# Patient Record
Sex: Female | Born: 1935 | Race: Black or African American | Hispanic: No | Smoking: Never smoker
Health system: Southern US, Community
[De-identification: ages and names within clinical notes are randomized; demographics above are authoritative.]

## PROBLEM LIST (undated history)

## (undated) DIAGNOSIS — E079 Disorder of thyroid, unspecified: Secondary | ICD-10-CM

## (undated) DIAGNOSIS — C50912 Malignant neoplasm of unspecified site of left female breast: Secondary | ICD-10-CM

## (undated) DIAGNOSIS — F32A Depression, unspecified: Secondary | ICD-10-CM

## (undated) DIAGNOSIS — Z9989 Dependence on other enabling machines and devices: Secondary | ICD-10-CM

## (undated) DIAGNOSIS — R32 Unspecified urinary incontinence: Secondary | ICD-10-CM

## (undated) DIAGNOSIS — R001 Bradycardia, unspecified: Secondary | ICD-10-CM

## (undated) DIAGNOSIS — Z974 Presence of external hearing-aid: Secondary | ICD-10-CM

## (undated) DIAGNOSIS — M1990 Unspecified osteoarthritis, unspecified site: Secondary | ICD-10-CM

## (undated) DIAGNOSIS — Z950 Presence of cardiac pacemaker: Secondary | ICD-10-CM

## (undated) DIAGNOSIS — K08109 Complete loss of teeth, unspecified cause, unspecified class: Secondary | ICD-10-CM

## (undated) DIAGNOSIS — C50911 Malignant neoplasm of unspecified site of right female breast: Secondary | ICD-10-CM

## (undated) DIAGNOSIS — F419 Anxiety disorder, unspecified: Secondary | ICD-10-CM

## (undated) DIAGNOSIS — K219 Gastro-esophageal reflux disease without esophagitis: Secondary | ICD-10-CM

## (undated) DIAGNOSIS — L930 Discoid lupus erythematosus: Secondary | ICD-10-CM

## (undated) DIAGNOSIS — M109 Gout, unspecified: Secondary | ICD-10-CM

## (undated) DIAGNOSIS — M8580 Other specified disorders of bone density and structure, unspecified site: Secondary | ICD-10-CM

## (undated) DIAGNOSIS — G4733 Obstructive sleep apnea (adult) (pediatric): Secondary | ICD-10-CM

## (undated) DIAGNOSIS — Z9289 Personal history of other medical treatment: Secondary | ICD-10-CM

## (undated) DIAGNOSIS — K589 Irritable bowel syndrome without diarrhea: Secondary | ICD-10-CM

## (undated) DIAGNOSIS — I509 Heart failure, unspecified: Secondary | ICD-10-CM

## (undated) DIAGNOSIS — F329 Major depressive disorder, single episode, unspecified: Secondary | ICD-10-CM

## (undated) DIAGNOSIS — E039 Hypothyroidism, unspecified: Secondary | ICD-10-CM

## (undated) DIAGNOSIS — I10 Essential (primary) hypertension: Secondary | ICD-10-CM

## (undated) DIAGNOSIS — Z972 Presence of dental prosthetic device (complete) (partial): Secondary | ICD-10-CM

## (undated) HISTORY — PX: HEMORRHOID BANDING: SHX5850

## (undated) HISTORY — DX: Personal history of other medical treatment: Z92.89

## (undated) HISTORY — DX: Bradycardia, unspecified: R00.1

## (undated) HISTORY — DX: Anxiety disorder, unspecified: F41.9

## (undated) HISTORY — PX: VAGINAL HYSTERECTOMY: SUR661

## (undated) HISTORY — DX: Unspecified urinary incontinence: R32

## (undated) HISTORY — DX: Discoid lupus erythematosus: L93.0

## (undated) HISTORY — DX: Depression, unspecified: F32.A

## (undated) HISTORY — DX: Major depressive disorder, single episode, unspecified: F32.9

## (undated) HISTORY — DX: Essential (primary) hypertension: I10

## (undated) HISTORY — DX: Irritable bowel syndrome, unspecified: K58.9

## (undated) HISTORY — DX: Presence of cardiac pacemaker: Z95.0

## (undated) HISTORY — PX: CATARACT EXTRACTION W/ INTRAOCULAR LENS  IMPLANT, BILATERAL: SHX1307

## (undated) HISTORY — DX: Gastro-esophageal reflux disease without esophagitis: K21.9

## (undated) HISTORY — DX: Other specified disorders of bone density and structure, unspecified site: M85.80

## (undated) HISTORY — DX: Disorder of thyroid, unspecified: E07.9

## (undated) HISTORY — DX: Obstructive sleep apnea (adult) (pediatric): G47.33

## (undated) HISTORY — DX: Unspecified osteoarthritis, unspecified site: M19.90

---

## 1993-04-26 DIAGNOSIS — C50912 Malignant neoplasm of unspecified site of left female breast: Secondary | ICD-10-CM

## 1993-04-26 HISTORY — PX: BREAST BIOPSY: SHX20

## 1993-04-26 HISTORY — PX: BREAST LUMPECTOMY: SHX2

## 1993-04-26 HISTORY — DX: Malignant neoplasm of unspecified site of left female breast: C50.912

## 1998-04-26 HISTORY — PX: DORSAL COMPARTMENT RELEASE: SHX1474

## 1998-07-17 ENCOUNTER — Other Ambulatory Visit: Admission: RE | Admit: 1998-07-17 | Discharge: 1998-07-17 | Payer: Self-pay | Admitting: Obstetrics and Gynecology

## 1998-07-27 ENCOUNTER — Emergency Department (HOSPITAL_COMMUNITY): Admission: EM | Admit: 1998-07-27 | Discharge: 1998-07-28 | Payer: Self-pay | Admitting: Emergency Medicine

## 1999-03-31 ENCOUNTER — Ambulatory Visit (HOSPITAL_BASED_OUTPATIENT_CLINIC_OR_DEPARTMENT_OTHER): Admission: RE | Admit: 1999-03-31 | Discharge: 1999-03-31 | Payer: Self-pay | Admitting: Orthopedic Surgery

## 2000-02-25 ENCOUNTER — Emergency Department (HOSPITAL_COMMUNITY): Admission: EM | Admit: 2000-02-25 | Discharge: 2000-02-26 | Payer: Self-pay | Admitting: Emergency Medicine

## 2000-02-28 ENCOUNTER — Ambulatory Visit (HOSPITAL_BASED_OUTPATIENT_CLINIC_OR_DEPARTMENT_OTHER): Admission: RE | Admit: 2000-02-28 | Discharge: 2000-02-28 | Payer: Self-pay | Admitting: Pulmonary Disease

## 2000-03-31 ENCOUNTER — Encounter: Payer: Self-pay | Admitting: Internal Medicine

## 2000-03-31 ENCOUNTER — Ambulatory Visit (HOSPITAL_COMMUNITY): Admission: RE | Admit: 2000-03-31 | Discharge: 2000-03-31 | Payer: Self-pay | Admitting: Internal Medicine

## 2000-04-13 ENCOUNTER — Encounter: Admission: RE | Admit: 2000-04-13 | Discharge: 2000-04-13 | Payer: Self-pay | Admitting: Specialist

## 2000-04-13 ENCOUNTER — Encounter: Payer: Self-pay | Admitting: Specialist

## 2000-04-26 DIAGNOSIS — C50911 Malignant neoplasm of unspecified site of right female breast: Secondary | ICD-10-CM

## 2000-04-26 HISTORY — DX: Malignant neoplasm of unspecified site of right female breast: C50.911

## 2000-07-03 ENCOUNTER — Emergency Department (HOSPITAL_COMMUNITY): Admission: EM | Admit: 2000-07-03 | Discharge: 2000-07-04 | Payer: Self-pay | Admitting: Emergency Medicine

## 2000-07-04 ENCOUNTER — Encounter: Payer: Self-pay | Admitting: Emergency Medicine

## 2000-08-02 ENCOUNTER — Emergency Department (HOSPITAL_COMMUNITY): Admission: EM | Admit: 2000-08-02 | Discharge: 2000-08-02 | Payer: Self-pay

## 2000-09-24 HISTORY — PX: MASTECTOMY: SHX3

## 2000-10-12 ENCOUNTER — Encounter: Payer: Self-pay | Admitting: Surgery

## 2000-10-14 ENCOUNTER — Encounter (INDEPENDENT_AMBULATORY_CARE_PROVIDER_SITE_OTHER): Payer: Self-pay | Admitting: *Deleted

## 2000-10-14 ENCOUNTER — Inpatient Hospital Stay (HOSPITAL_COMMUNITY): Admission: RE | Admit: 2000-10-14 | Discharge: 2000-10-17 | Payer: Self-pay | Admitting: Surgery

## 2000-10-31 ENCOUNTER — Ambulatory Visit (HOSPITAL_COMMUNITY): Admission: RE | Admit: 2000-10-31 | Discharge: 2000-10-31 | Payer: Self-pay | Admitting: Oncology

## 2000-10-31 ENCOUNTER — Encounter: Payer: Self-pay | Admitting: Oncology

## 2000-11-03 ENCOUNTER — Encounter: Payer: Self-pay | Admitting: Oncology

## 2000-11-03 ENCOUNTER — Ambulatory Visit (HOSPITAL_COMMUNITY): Admission: RE | Admit: 2000-11-03 | Discharge: 2000-11-03 | Payer: Self-pay | Admitting: Oncology

## 2001-01-17 ENCOUNTER — Encounter (INDEPENDENT_AMBULATORY_CARE_PROVIDER_SITE_OTHER): Payer: Self-pay | Admitting: *Deleted

## 2001-01-17 ENCOUNTER — Ambulatory Visit (HOSPITAL_BASED_OUTPATIENT_CLINIC_OR_DEPARTMENT_OTHER): Admission: RE | Admit: 2001-01-17 | Discharge: 2001-01-18 | Payer: Self-pay | Admitting: Surgery

## 2001-01-18 ENCOUNTER — Ambulatory Visit (HOSPITAL_COMMUNITY): Admission: RE | Admit: 2001-01-18 | Discharge: 2001-01-18 | Payer: Self-pay | Admitting: Internal Medicine

## 2001-03-09 ENCOUNTER — Encounter: Admission: RE | Admit: 2001-03-09 | Discharge: 2001-03-09 | Payer: Self-pay | Admitting: Surgery

## 2001-03-09 ENCOUNTER — Encounter: Payer: Self-pay | Admitting: Surgery

## 2001-03-16 ENCOUNTER — Encounter: Admission: RE | Admit: 2001-03-16 | Discharge: 2001-03-16 | Payer: Self-pay | Admitting: Surgery

## 2001-03-16 ENCOUNTER — Encounter: Payer: Self-pay | Admitting: Surgery

## 2001-07-19 ENCOUNTER — Other Ambulatory Visit: Admission: RE | Admit: 2001-07-19 | Discharge: 2001-07-19 | Payer: Self-pay | Admitting: Obstetrics and Gynecology

## 2001-11-04 ENCOUNTER — Emergency Department (HOSPITAL_COMMUNITY): Admission: EM | Admit: 2001-11-04 | Discharge: 2001-11-04 | Payer: Self-pay | Admitting: Emergency Medicine

## 2002-04-26 HISTORY — PX: HAMMER TOE SURGERY: SHX385

## 2002-06-08 ENCOUNTER — Ambulatory Visit (HOSPITAL_COMMUNITY): Admission: RE | Admit: 2002-06-08 | Discharge: 2002-06-08 | Payer: Self-pay | Admitting: Oncology

## 2002-06-08 ENCOUNTER — Encounter: Payer: Self-pay | Admitting: Oncology

## 2002-11-05 ENCOUNTER — Emergency Department (HOSPITAL_COMMUNITY): Admission: EM | Admit: 2002-11-05 | Discharge: 2002-11-05 | Payer: Self-pay | Admitting: Emergency Medicine

## 2003-11-25 ENCOUNTER — Ambulatory Visit (HOSPITAL_COMMUNITY): Admission: RE | Admit: 2003-11-25 | Discharge: 2003-11-25 | Payer: Self-pay | Admitting: Family Medicine

## 2004-01-15 ENCOUNTER — Other Ambulatory Visit (HOSPITAL_COMMUNITY): Admission: RE | Admit: 2004-01-15 | Discharge: 2004-04-14 | Payer: Self-pay | Admitting: Psychiatry

## 2004-01-15 ENCOUNTER — Ambulatory Visit: Payer: Self-pay | Admitting: Psychiatry

## 2004-01-29 ENCOUNTER — Encounter: Admission: RE | Admit: 2004-01-29 | Discharge: 2004-01-29 | Payer: Self-pay | Admitting: Internal Medicine

## 2004-02-03 ENCOUNTER — Ambulatory Visit (HOSPITAL_COMMUNITY): Admission: RE | Admit: 2004-02-03 | Discharge: 2004-02-03 | Payer: Self-pay | Admitting: Internal Medicine

## 2004-02-07 ENCOUNTER — Ambulatory Visit (HOSPITAL_COMMUNITY): Payer: Self-pay | Admitting: Psychiatry

## 2004-02-25 ENCOUNTER — Ambulatory Visit: Payer: Self-pay | Admitting: Psychology

## 2004-03-02 ENCOUNTER — Ambulatory Visit: Payer: Self-pay | Admitting: Internal Medicine

## 2004-03-03 ENCOUNTER — Ambulatory Visit: Payer: Self-pay | Admitting: Internal Medicine

## 2004-03-03 ENCOUNTER — Ambulatory Visit: Payer: Self-pay | Admitting: Psychology

## 2004-03-06 ENCOUNTER — Ambulatory Visit (HOSPITAL_COMMUNITY): Payer: Self-pay | Admitting: Psychiatry

## 2004-03-12 ENCOUNTER — Ambulatory Visit: Payer: Self-pay | Admitting: Psychology

## 2004-03-17 ENCOUNTER — Ambulatory Visit: Payer: Self-pay | Admitting: Psychology

## 2004-03-24 ENCOUNTER — Ambulatory Visit: Payer: Self-pay | Admitting: Psychology

## 2004-03-27 ENCOUNTER — Ambulatory Visit: Payer: Self-pay | Admitting: Internal Medicine

## 2004-03-30 ENCOUNTER — Encounter: Admission: RE | Admit: 2004-03-30 | Discharge: 2004-03-30 | Payer: Self-pay | Admitting: Psychology

## 2004-04-02 ENCOUNTER — Ambulatory Visit: Payer: Self-pay | Admitting: Psychology

## 2004-04-06 ENCOUNTER — Ambulatory Visit: Payer: Self-pay | Admitting: Internal Medicine

## 2004-04-08 ENCOUNTER — Ambulatory Visit (HOSPITAL_COMMUNITY): Payer: Self-pay | Admitting: Psychiatry

## 2004-04-09 ENCOUNTER — Ambulatory Visit: Payer: Self-pay | Admitting: Psychology

## 2004-04-21 ENCOUNTER — Ambulatory Visit: Payer: Self-pay | Admitting: Internal Medicine

## 2004-04-23 ENCOUNTER — Ambulatory Visit: Payer: Self-pay | Admitting: Psychology

## 2004-04-29 ENCOUNTER — Ambulatory Visit: Payer: Self-pay | Admitting: Oncology

## 2004-05-01 ENCOUNTER — Ambulatory Visit (HOSPITAL_COMMUNITY): Admission: RE | Admit: 2004-05-01 | Discharge: 2004-05-01 | Payer: Self-pay | Admitting: Oncology

## 2004-05-07 ENCOUNTER — Ambulatory Visit: Payer: Self-pay | Admitting: Psychology

## 2004-05-08 ENCOUNTER — Ambulatory Visit: Payer: Self-pay | Admitting: Cardiology

## 2004-05-14 ENCOUNTER — Ambulatory Visit: Payer: Self-pay | Admitting: Psychology

## 2004-05-18 ENCOUNTER — Encounter: Admission: RE | Admit: 2004-05-18 | Discharge: 2004-05-18 | Payer: Self-pay | Admitting: Gastroenterology

## 2004-05-21 ENCOUNTER — Ambulatory Visit: Payer: Self-pay | Admitting: Psychology

## 2004-05-28 ENCOUNTER — Ambulatory Visit: Payer: Self-pay | Admitting: Psychology

## 2004-05-28 ENCOUNTER — Ambulatory Visit: Payer: Self-pay | Admitting: Internal Medicine

## 2004-05-29 ENCOUNTER — Ambulatory Visit (HOSPITAL_COMMUNITY): Payer: Self-pay | Admitting: Psychiatry

## 2004-06-02 ENCOUNTER — Ambulatory Visit: Payer: Self-pay

## 2004-06-11 ENCOUNTER — Ambulatory Visit: Payer: Self-pay | Admitting: Psychology

## 2004-06-15 ENCOUNTER — Ambulatory Visit (HOSPITAL_COMMUNITY): Admission: RE | Admit: 2004-06-15 | Discharge: 2004-06-15 | Payer: Self-pay | Admitting: Internal Medicine

## 2004-06-18 ENCOUNTER — Ambulatory Visit: Payer: Self-pay | Admitting: Psychology

## 2004-06-19 ENCOUNTER — Emergency Department (HOSPITAL_COMMUNITY): Admission: EM | Admit: 2004-06-19 | Discharge: 2004-06-19 | Payer: Self-pay | Admitting: Emergency Medicine

## 2004-07-02 ENCOUNTER — Ambulatory Visit: Payer: Self-pay | Admitting: Psychology

## 2004-07-09 ENCOUNTER — Ambulatory Visit: Payer: Self-pay | Admitting: Psychology

## 2004-07-12 ENCOUNTER — Emergency Department (HOSPITAL_COMMUNITY): Admission: EM | Admit: 2004-07-12 | Discharge: 2004-07-12 | Payer: Self-pay | Admitting: Emergency Medicine

## 2004-07-16 ENCOUNTER — Ambulatory Visit: Payer: Self-pay | Admitting: Psychology

## 2004-07-23 ENCOUNTER — Ambulatory Visit: Payer: Self-pay | Admitting: Psychology

## 2004-07-30 ENCOUNTER — Ambulatory Visit: Payer: Self-pay | Admitting: Psychology

## 2004-08-05 ENCOUNTER — Ambulatory Visit (HOSPITAL_COMMUNITY): Payer: Self-pay | Admitting: Psychiatry

## 2004-08-13 ENCOUNTER — Ambulatory Visit: Payer: Self-pay | Admitting: Psychology

## 2004-08-20 ENCOUNTER — Ambulatory Visit: Payer: Self-pay | Admitting: Psychology

## 2004-08-27 ENCOUNTER — Ambulatory Visit: Payer: Self-pay | Admitting: Internal Medicine

## 2004-08-27 ENCOUNTER — Ambulatory Visit: Payer: Self-pay | Admitting: Psychology

## 2004-09-03 ENCOUNTER — Ambulatory Visit: Payer: Self-pay | Admitting: Psychology

## 2004-09-09 ENCOUNTER — Ambulatory Visit (HOSPITAL_COMMUNITY): Payer: Self-pay | Admitting: Psychiatry

## 2004-09-10 ENCOUNTER — Ambulatory Visit: Payer: Self-pay | Admitting: Psychology

## 2004-09-18 ENCOUNTER — Ambulatory Visit: Payer: Self-pay | Admitting: Psychology

## 2004-10-01 ENCOUNTER — Ambulatory Visit: Payer: Self-pay | Admitting: Psychology

## 2004-10-22 ENCOUNTER — Ambulatory Visit: Payer: Self-pay | Admitting: Psychology

## 2004-10-26 ENCOUNTER — Ambulatory Visit: Payer: Self-pay | Admitting: Oncology

## 2004-10-26 ENCOUNTER — Ambulatory Visit: Payer: Self-pay | Admitting: Internal Medicine

## 2004-10-29 ENCOUNTER — Ambulatory Visit: Payer: Self-pay | Admitting: Psychology

## 2004-11-05 ENCOUNTER — Ambulatory Visit: Payer: Self-pay | Admitting: Psychology

## 2004-11-12 ENCOUNTER — Ambulatory Visit: Payer: Self-pay | Admitting: Psychology

## 2004-11-16 ENCOUNTER — Ambulatory Visit (HOSPITAL_COMMUNITY): Payer: Self-pay | Admitting: Psychiatry

## 2004-11-19 ENCOUNTER — Ambulatory Visit: Payer: Self-pay | Admitting: Psychology

## 2004-12-03 ENCOUNTER — Ambulatory Visit: Payer: Self-pay | Admitting: Psychology

## 2004-12-03 ENCOUNTER — Ambulatory Visit: Payer: Self-pay | Admitting: Internal Medicine

## 2004-12-10 ENCOUNTER — Ambulatory Visit: Payer: Self-pay | Admitting: Psychology

## 2005-01-07 ENCOUNTER — Ambulatory Visit: Payer: Self-pay | Admitting: Psychology

## 2005-01-14 ENCOUNTER — Ambulatory Visit: Payer: Self-pay | Admitting: Psychology

## 2005-01-21 ENCOUNTER — Ambulatory Visit: Payer: Self-pay | Admitting: Psychology

## 2005-02-04 ENCOUNTER — Ambulatory Visit: Payer: Self-pay | Admitting: Psychology

## 2005-02-11 ENCOUNTER — Ambulatory Visit: Payer: Self-pay | Admitting: Psychology

## 2005-02-17 ENCOUNTER — Ambulatory Visit (HOSPITAL_COMMUNITY): Payer: Self-pay | Admitting: Psychiatry

## 2005-02-18 ENCOUNTER — Ambulatory Visit: Payer: Self-pay | Admitting: Psychology

## 2005-02-25 ENCOUNTER — Ambulatory Visit: Payer: Self-pay | Admitting: Internal Medicine

## 2005-02-25 ENCOUNTER — Ambulatory Visit: Payer: Self-pay | Admitting: Psychology

## 2005-03-04 ENCOUNTER — Ambulatory Visit: Payer: Self-pay | Admitting: Psychology

## 2005-03-11 ENCOUNTER — Ambulatory Visit: Payer: Self-pay | Admitting: Psychology

## 2005-03-25 ENCOUNTER — Ambulatory Visit: Payer: Self-pay | Admitting: Psychology

## 2005-04-01 ENCOUNTER — Ambulatory Visit: Payer: Self-pay | Admitting: Psychology

## 2005-04-14 ENCOUNTER — Ambulatory Visit (HOSPITAL_COMMUNITY): Payer: Self-pay | Admitting: Psychiatry

## 2005-04-22 ENCOUNTER — Ambulatory Visit: Payer: Self-pay | Admitting: Psychology

## 2005-04-29 ENCOUNTER — Ambulatory Visit: Payer: Self-pay | Admitting: Psychology

## 2005-04-29 ENCOUNTER — Ambulatory Visit: Payer: Self-pay | Admitting: Oncology

## 2005-05-03 ENCOUNTER — Ambulatory Visit (HOSPITAL_COMMUNITY): Admission: RE | Admit: 2005-05-03 | Discharge: 2005-05-03 | Payer: Self-pay | Admitting: Surgery

## 2005-05-06 ENCOUNTER — Ambulatory Visit: Payer: Self-pay | Admitting: Psychology

## 2005-05-14 ENCOUNTER — Ambulatory Visit: Payer: Self-pay | Admitting: Internal Medicine

## 2005-05-20 ENCOUNTER — Ambulatory Visit: Payer: Self-pay | Admitting: Psychology

## 2005-05-27 ENCOUNTER — Ambulatory Visit: Payer: Self-pay | Admitting: Psychology

## 2005-06-07 ENCOUNTER — Ambulatory Visit (HOSPITAL_COMMUNITY): Payer: Self-pay | Admitting: Psychiatry

## 2005-06-10 ENCOUNTER — Ambulatory Visit: Payer: Self-pay | Admitting: Psychology

## 2005-06-24 ENCOUNTER — Ambulatory Visit: Payer: Self-pay | Admitting: Psychology

## 2005-07-01 ENCOUNTER — Ambulatory Visit: Payer: Self-pay | Admitting: Psychology

## 2005-07-22 ENCOUNTER — Ambulatory Visit: Payer: Self-pay | Admitting: Psychology

## 2005-07-29 ENCOUNTER — Ambulatory Visit: Payer: Self-pay | Admitting: Psychology

## 2005-08-02 ENCOUNTER — Ambulatory Visit (HOSPITAL_COMMUNITY): Payer: Self-pay | Admitting: Psychiatry

## 2005-08-05 ENCOUNTER — Ambulatory Visit: Payer: Self-pay | Admitting: Psychology

## 2005-08-12 ENCOUNTER — Ambulatory Visit: Payer: Self-pay | Admitting: Internal Medicine

## 2005-08-12 ENCOUNTER — Ambulatory Visit: Payer: Self-pay | Admitting: Psychology

## 2005-08-19 ENCOUNTER — Ambulatory Visit: Payer: Self-pay | Admitting: Psychology

## 2005-08-26 ENCOUNTER — Ambulatory Visit: Payer: Self-pay | Admitting: Psychology

## 2005-08-31 ENCOUNTER — Ambulatory Visit: Payer: Self-pay | Admitting: Internal Medicine

## 2005-09-02 ENCOUNTER — Ambulatory Visit: Payer: Self-pay | Admitting: Psychology

## 2005-09-09 ENCOUNTER — Ambulatory Visit: Payer: Self-pay | Admitting: Psychology

## 2005-09-16 ENCOUNTER — Ambulatory Visit: Payer: Self-pay | Admitting: Psychology

## 2005-09-16 ENCOUNTER — Ambulatory Visit: Payer: Self-pay | Admitting: Internal Medicine

## 2005-09-22 ENCOUNTER — Ambulatory Visit (HOSPITAL_COMMUNITY): Payer: Self-pay | Admitting: Psychiatry

## 2005-09-24 ENCOUNTER — Ambulatory Visit: Payer: Self-pay | Admitting: Internal Medicine

## 2005-09-24 LAB — CONVERTED CEMR LAB
BUN: 20 mg/dL (ref 6–23)
CO2: 30 meq/L (ref 19–32)
Calcium: 9.1 mg/dL (ref 8.4–10.5)
Free T4: 1.3 ng/dL (ref 0.6–1.6)
GFR calc Af Amer: 70 mL/min
GFR calc non Af Amer: 58 mL/min
Potassium: 3.5 meq/L (ref 3.5–5.5)
Sed Rate: 18 mm/hr (ref 0–25)

## 2005-09-30 ENCOUNTER — Ambulatory Visit: Payer: Self-pay | Admitting: Psychology

## 2005-10-07 ENCOUNTER — Ambulatory Visit: Payer: Self-pay | Admitting: Psychology

## 2005-10-14 ENCOUNTER — Ambulatory Visit: Payer: Self-pay | Admitting: Internal Medicine

## 2005-10-14 ENCOUNTER — Ambulatory Visit: Payer: Self-pay | Admitting: Psychology

## 2005-10-21 ENCOUNTER — Ambulatory Visit: Payer: Self-pay | Admitting: Psychology

## 2005-10-21 ENCOUNTER — Ambulatory Visit: Payer: Self-pay | Admitting: Oncology

## 2005-11-01 LAB — COMPREHENSIVE METABOLIC PANEL
ALT: 17 U/L (ref 0–40)
AST: 22 U/L (ref 0–37)
Alkaline Phosphatase: 80 U/L (ref 39–117)
CO2: 29 mEq/L (ref 19–32)
Sodium: 141 mEq/L (ref 135–145)
Total Bilirubin: 0.8 mg/dL (ref 0.3–1.2)
Total Protein: 7.6 g/dL (ref 6.0–8.3)

## 2005-11-01 LAB — CBC WITH DIFFERENTIAL/PLATELET
BASO%: 0.3 % (ref 0.0–2.0)
EOS%: 1.9 % (ref 0.0–7.0)
LYMPH%: 30.7 % (ref 14.0–48.0)
MCH: 32.6 pg (ref 26.0–34.0)
MCHC: 34 g/dL (ref 32.0–36.0)
MONO#: 0.5 10*3/uL (ref 0.1–0.9)
Platelets: 274 10*3/uL (ref 145–400)
RBC: 3.98 10*6/uL (ref 3.70–5.32)
WBC: 5.3 10*3/uL (ref 3.9–10.0)

## 2005-11-01 LAB — LACTATE DEHYDROGENASE: LDH: 167 U/L (ref 94–250)

## 2005-11-04 ENCOUNTER — Ambulatory Visit: Payer: Self-pay | Admitting: Psychology

## 2005-11-11 ENCOUNTER — Ambulatory Visit: Payer: Self-pay | Admitting: Psychology

## 2005-11-18 ENCOUNTER — Ambulatory Visit: Payer: Self-pay | Admitting: Psychology

## 2005-12-02 ENCOUNTER — Ambulatory Visit: Payer: Self-pay | Admitting: Psychology

## 2005-12-16 ENCOUNTER — Ambulatory Visit: Payer: Self-pay | Admitting: Psychology

## 2005-12-20 ENCOUNTER — Ambulatory Visit (HOSPITAL_COMMUNITY): Payer: Self-pay | Admitting: Psychiatry

## 2005-12-23 ENCOUNTER — Ambulatory Visit: Payer: Self-pay | Admitting: Psychology

## 2005-12-30 ENCOUNTER — Ambulatory Visit: Payer: Self-pay | Admitting: Psychology

## 2006-01-13 ENCOUNTER — Ambulatory Visit: Payer: Self-pay | Admitting: Psychology

## 2006-01-27 ENCOUNTER — Ambulatory Visit: Payer: Self-pay | Admitting: Psychology

## 2006-02-01 ENCOUNTER — Ambulatory Visit: Payer: Self-pay | Admitting: Internal Medicine

## 2006-02-03 ENCOUNTER — Ambulatory Visit: Payer: Self-pay | Admitting: Psychology

## 2006-02-10 ENCOUNTER — Ambulatory Visit: Payer: Self-pay | Admitting: Psychology

## 2006-02-17 ENCOUNTER — Ambulatory Visit: Payer: Self-pay | Admitting: Psychology

## 2006-02-24 ENCOUNTER — Ambulatory Visit: Payer: Self-pay | Admitting: Psychology

## 2006-03-03 ENCOUNTER — Ambulatory Visit: Payer: Self-pay | Admitting: Psychology

## 2006-03-16 ENCOUNTER — Ambulatory Visit (HOSPITAL_COMMUNITY): Payer: Self-pay | Admitting: Psychiatry

## 2006-03-24 ENCOUNTER — Ambulatory Visit: Payer: Self-pay | Admitting: Psychology

## 2006-03-28 ENCOUNTER — Ambulatory Visit (HOSPITAL_COMMUNITY): Admission: RE | Admit: 2006-03-28 | Discharge: 2006-03-28 | Payer: Self-pay | Admitting: Oncology

## 2006-03-31 ENCOUNTER — Ambulatory Visit: Payer: Self-pay | Admitting: Psychology

## 2006-04-07 ENCOUNTER — Encounter: Admission: RE | Admit: 2006-04-07 | Discharge: 2006-04-07 | Payer: Self-pay | Admitting: Obstetrics and Gynecology

## 2006-04-14 ENCOUNTER — Ambulatory Visit: Payer: Self-pay | Admitting: Psychology

## 2006-04-25 ENCOUNTER — Ambulatory Visit: Payer: Self-pay | Admitting: Oncology

## 2006-04-28 ENCOUNTER — Ambulatory Visit: Payer: Self-pay | Admitting: Psychology

## 2006-04-28 LAB — CBC WITH DIFFERENTIAL/PLATELET
BASO%: 0.4 % (ref 0.0–2.0)
Basophils Absolute: 0 10*3/uL (ref 0.0–0.1)
EOS%: 1.4 % (ref 0.0–7.0)
HCT: 37.3 % (ref 34.8–46.6)
LYMPH%: 28.1 % (ref 14.0–48.0)
MCH: 31.7 pg (ref 26.0–34.0)
MCHC: 33.7 g/dL (ref 32.0–36.0)
MCV: 94.2 fL (ref 81.0–101.0)
MONO%: 8.4 % (ref 0.0–13.0)
NEUT%: 61.7 % (ref 39.6–76.8)
Platelets: 248 10*3/uL (ref 145–400)

## 2006-04-28 LAB — COMPREHENSIVE METABOLIC PANEL
AST: 16 U/L (ref 0–37)
BUN: 19 mg/dL (ref 6–23)
CO2: 31 mEq/L (ref 19–32)
Calcium: 9.4 mg/dL (ref 8.4–10.5)
Chloride: 102 mEq/L (ref 96–112)
Creatinine, Ser: 0.88 mg/dL (ref 0.40–1.20)

## 2006-04-28 LAB — LACTATE DEHYDROGENASE: LDH: 147 U/L (ref 94–250)

## 2006-05-04 ENCOUNTER — Ambulatory Visit: Payer: Self-pay | Admitting: Internal Medicine

## 2006-05-05 ENCOUNTER — Ambulatory Visit: Payer: Self-pay | Admitting: Psychology

## 2006-05-12 ENCOUNTER — Ambulatory Visit: Payer: Self-pay | Admitting: Psychology

## 2006-05-19 ENCOUNTER — Ambulatory Visit: Payer: Self-pay | Admitting: Psychology

## 2006-05-24 ENCOUNTER — Ambulatory Visit (HOSPITAL_BASED_OUTPATIENT_CLINIC_OR_DEPARTMENT_OTHER): Admission: RE | Admit: 2006-05-24 | Discharge: 2006-05-24 | Payer: Self-pay | Admitting: Orthopedic Surgery

## 2006-05-26 ENCOUNTER — Ambulatory Visit: Payer: Self-pay | Admitting: Psychology

## 2006-06-13 ENCOUNTER — Ambulatory Visit (HOSPITAL_COMMUNITY): Payer: Self-pay | Admitting: Psychiatry

## 2006-06-16 ENCOUNTER — Ambulatory Visit: Payer: Self-pay | Admitting: Psychology

## 2006-06-22 ENCOUNTER — Ambulatory Visit: Payer: Self-pay | Admitting: Internal Medicine

## 2006-06-22 LAB — CONVERTED CEMR LAB
BUN: 13 mg/dL (ref 6–23)
Hgb A1c MFr Bld: 5.7 % (ref 4.6–6.0)
Potassium: 4.1 meq/L (ref 3.5–5.1)
Sodium: 144 meq/L (ref 135–145)
TSH: 0.13 microintl units/mL — ABNORMAL LOW (ref 0.35–5.50)

## 2006-06-23 ENCOUNTER — Ambulatory Visit: Payer: Self-pay | Admitting: Psychology

## 2006-06-30 ENCOUNTER — Ambulatory Visit: Payer: Self-pay | Admitting: Psychology

## 2006-07-05 ENCOUNTER — Encounter: Admission: RE | Admit: 2006-07-05 | Discharge: 2006-10-03 | Payer: Self-pay | Admitting: Psychology

## 2006-07-07 ENCOUNTER — Ambulatory Visit: Payer: Self-pay | Admitting: Psychology

## 2006-07-14 ENCOUNTER — Ambulatory Visit: Payer: Self-pay | Admitting: Psychology

## 2006-07-21 ENCOUNTER — Ambulatory Visit: Payer: Self-pay | Admitting: Psychology

## 2006-07-25 ENCOUNTER — Ambulatory Visit (HOSPITAL_COMMUNITY): Payer: Self-pay | Admitting: Psychiatry

## 2006-07-28 ENCOUNTER — Ambulatory Visit: Payer: Self-pay | Admitting: Psychology

## 2006-08-11 ENCOUNTER — Ambulatory Visit: Payer: Self-pay | Admitting: Psychology

## 2006-08-18 ENCOUNTER — Ambulatory Visit: Payer: Self-pay | Admitting: Internal Medicine

## 2006-08-22 ENCOUNTER — Ambulatory Visit: Payer: Self-pay | Admitting: Internal Medicine

## 2006-08-22 LAB — CONVERTED CEMR LAB
AST: 21 units/L (ref 0–37)
Bilirubin Urine: NEGATIVE
Cholesterol: 195 mg/dL (ref 0–200)
Creatinine, Ser: 0.9 mg/dL (ref 0.4–1.2)
Glucose, Bld: 68 mg/dL — ABNORMAL LOW (ref 70–99)
HDL: 44.6 mg/dL (ref >39.0)
Hemoglobin, Urine: NEGATIVE
Leukocytes, UA: NEGATIVE
Potassium: 4 meq/L (ref 3.5–5.1)
TSH: 0.43 microintl units/mL (ref 0.35–5.50)
Triglycerides: 107 mg/dL (ref 0–149)
Urobilinogen, UA: 0.2 (ref 0.0–1.0)
VLDL: 21 mg/dL (ref 0–40)
pH: 7 (ref 5.0–8.0)

## 2006-08-25 ENCOUNTER — Ambulatory Visit: Payer: Self-pay | Admitting: Psychology

## 2006-09-01 ENCOUNTER — Ambulatory Visit: Payer: Self-pay | Admitting: Psychology

## 2006-09-08 ENCOUNTER — Ambulatory Visit: Payer: Self-pay | Admitting: Psychology

## 2006-09-27 ENCOUNTER — Ambulatory Visit: Payer: Self-pay | Admitting: Psychology

## 2006-09-30 ENCOUNTER — Ambulatory Visit: Payer: Self-pay | Admitting: Internal Medicine

## 2006-09-30 LAB — CONVERTED CEMR LAB
ALT: 16 units/L (ref 0–40)
Bacteria, UA: NEGATIVE
Bilirubin Urine: NEGATIVE
Direct LDL: 130.8 mg/dL
HDL: 43.2 mg/dL (ref >39.0)
Nitrite: NEGATIVE
RBC / HPF: NONE SEEN
Specific Gravity, Urine: 1.01 (ref 1.000–1.03)
TSH: 0.82 microintl units/mL (ref 0.35–5.50)
Triglycerides: 93 mg/dL (ref 0–149)
Urine Glucose: NEGATIVE mg/dL
Urobilinogen, UA: 0.2 (ref 0.0–1.0)
VLDL: 19 mg/dL (ref 0–40)
pH: 8.5 — AB (ref 5.0–8.0)

## 2006-10-03 ENCOUNTER — Ambulatory Visit: Payer: Self-pay | Admitting: Internal Medicine

## 2006-10-06 ENCOUNTER — Ambulatory Visit: Payer: Self-pay | Admitting: Psychology

## 2006-10-13 ENCOUNTER — Ambulatory Visit: Payer: Self-pay | Admitting: Psychology

## 2006-10-24 ENCOUNTER — Ambulatory Visit (HOSPITAL_COMMUNITY): Payer: Self-pay | Admitting: Psychiatry

## 2006-10-25 ENCOUNTER — Ambulatory Visit: Payer: Self-pay | Admitting: Oncology

## 2006-10-27 ENCOUNTER — Ambulatory Visit: Payer: Self-pay | Admitting: Psychology

## 2006-10-31 LAB — CBC WITH DIFFERENTIAL/PLATELET
Basophils Absolute: 0 10*3/uL (ref 0.0–0.1)
Eosinophils Absolute: 0.1 10*3/uL (ref 0.0–0.5)
HGB: 13.2 g/dL (ref 11.6–15.9)
MONO#: 0.4 10*3/uL (ref 0.1–0.9)
NEUT#: 3.5 10*3/uL (ref 1.5–6.5)
RDW: 14.3 % (ref 11.3–14.5)
WBC: 5.6 10*3/uL (ref 3.9–10.0)
lymph#: 1.7 10*3/uL (ref 0.9–3.3)

## 2006-10-31 LAB — COMPREHENSIVE METABOLIC PANEL
Albumin: 4.2 g/dL (ref 3.5–5.2)
BUN: 22 mg/dL (ref 6–23)
Calcium: 9.3 mg/dL (ref 8.4–10.5)
Chloride: 103 mEq/L (ref 96–112)
Glucose, Bld: 101 mg/dL — ABNORMAL HIGH (ref 70–99)
Potassium: 4.4 mEq/L (ref 3.5–5.3)
Total Protein: 7.3 g/dL (ref 6.0–8.3)

## 2006-11-03 ENCOUNTER — Ambulatory Visit: Payer: Self-pay | Admitting: Psychology

## 2006-11-17 ENCOUNTER — Encounter: Payer: Self-pay | Admitting: Internal Medicine

## 2006-11-17 DIAGNOSIS — M810 Age-related osteoporosis without current pathological fracture: Secondary | ICD-10-CM | POA: Insufficient documentation

## 2006-11-17 DIAGNOSIS — Z853 Personal history of malignant neoplasm of breast: Secondary | ICD-10-CM | POA: Insufficient documentation

## 2006-11-17 DIAGNOSIS — I119 Hypertensive heart disease without heart failure: Secondary | ICD-10-CM | POA: Insufficient documentation

## 2006-11-17 DIAGNOSIS — R5382 Chronic fatigue, unspecified: Secondary | ICD-10-CM | POA: Insufficient documentation

## 2006-11-17 DIAGNOSIS — J309 Allergic rhinitis, unspecified: Secondary | ICD-10-CM | POA: Insufficient documentation

## 2006-11-24 ENCOUNTER — Ambulatory Visit: Payer: Self-pay | Admitting: Psychology

## 2006-12-01 ENCOUNTER — Ambulatory Visit: Payer: Self-pay | Admitting: Psychology

## 2006-12-21 ENCOUNTER — Ambulatory Visit: Payer: Self-pay | Admitting: Internal Medicine

## 2006-12-21 LAB — CONVERTED CEMR LAB
BUN: 18 mg/dL (ref 6–23)
Calcium: 9.3 mg/dL (ref 8.4–10.5)
Chloride: 101 meq/L (ref 96–112)
GFR calc non Af Amer: 58 mL/min
Vit D, 1,25-Dihydroxy: 25 (ref 20–57)

## 2006-12-22 ENCOUNTER — Ambulatory Visit: Payer: Self-pay | Admitting: Psychology

## 2006-12-29 ENCOUNTER — Ambulatory Visit: Payer: Self-pay | Admitting: Psychology

## 2007-01-02 ENCOUNTER — Ambulatory Visit: Payer: Self-pay | Admitting: Internal Medicine

## 2007-01-05 ENCOUNTER — Ambulatory Visit: Payer: Self-pay | Admitting: Psychology

## 2007-01-16 ENCOUNTER — Ambulatory Visit (HOSPITAL_COMMUNITY): Payer: Self-pay | Admitting: Psychiatry

## 2007-01-19 ENCOUNTER — Ambulatory Visit: Payer: Self-pay | Admitting: Psychology

## 2007-01-26 ENCOUNTER — Ambulatory Visit: Payer: Self-pay | Admitting: Psychology

## 2007-02-13 ENCOUNTER — Ambulatory Visit (HOSPITAL_COMMUNITY): Payer: Self-pay | Admitting: Psychiatry

## 2007-02-16 ENCOUNTER — Ambulatory Visit: Payer: Self-pay | Admitting: Psychology

## 2007-02-23 ENCOUNTER — Ambulatory Visit: Payer: Self-pay | Admitting: Psychology

## 2007-03-09 ENCOUNTER — Ambulatory Visit: Payer: Self-pay | Admitting: Psychology

## 2007-03-16 ENCOUNTER — Ambulatory Visit: Payer: Self-pay | Admitting: Psychology

## 2007-03-16 ENCOUNTER — Telehealth (INDEPENDENT_AMBULATORY_CARE_PROVIDER_SITE_OTHER): Payer: Self-pay | Admitting: *Deleted

## 2007-03-16 ENCOUNTER — Telehealth: Payer: Self-pay | Admitting: Internal Medicine

## 2007-03-17 ENCOUNTER — Ambulatory Visit: Payer: Self-pay | Admitting: Internal Medicine

## 2007-03-17 ENCOUNTER — Ambulatory Visit (HOSPITAL_COMMUNITY): Payer: Self-pay | Admitting: Psychiatry

## 2007-03-22 ENCOUNTER — Ambulatory Visit: Payer: Self-pay | Admitting: Internal Medicine

## 2007-03-22 DIAGNOSIS — M949 Disorder of cartilage, unspecified: Secondary | ICD-10-CM

## 2007-03-22 DIAGNOSIS — E039 Hypothyroidism, unspecified: Secondary | ICD-10-CM | POA: Insufficient documentation

## 2007-03-22 DIAGNOSIS — N760 Acute vaginitis: Secondary | ICD-10-CM | POA: Insufficient documentation

## 2007-03-22 DIAGNOSIS — R5381 Other malaise: Secondary | ICD-10-CM | POA: Insufficient documentation

## 2007-03-22 DIAGNOSIS — M1990 Unspecified osteoarthritis, unspecified site: Secondary | ICD-10-CM | POA: Insufficient documentation

## 2007-03-22 DIAGNOSIS — K219 Gastro-esophageal reflux disease without esophagitis: Secondary | ICD-10-CM | POA: Insufficient documentation

## 2007-03-22 DIAGNOSIS — M899 Disorder of bone, unspecified: Secondary | ICD-10-CM | POA: Insufficient documentation

## 2007-03-22 DIAGNOSIS — R5383 Other fatigue: Secondary | ICD-10-CM

## 2007-03-22 LAB — CONVERTED CEMR LAB
BUN: 14 mg/dL (ref 6–23)
BUN: 14 mg/dL (ref 6–23)
Calcium: 9.3 mg/dL (ref 8.4–10.5)
Calcium: 9.3 mg/dL (ref 8.4–10.5)
Chloride: 106 meq/L (ref 96–112)
Chloride: 106 meq/L (ref 96–112)
Creatinine, Ser: 1 mg/dL (ref 0.4–1.2)
GFR calc non Af Amer: 57 mL/min
Potassium: 3.7 meq/L (ref 3.5–5.1)
Sodium: 141 meq/L (ref 135–145)

## 2007-03-29 ENCOUNTER — Encounter: Payer: Self-pay | Admitting: Internal Medicine

## 2007-03-29 ENCOUNTER — Telehealth: Payer: Self-pay | Admitting: Internal Medicine

## 2007-03-30 ENCOUNTER — Ambulatory Visit: Payer: Self-pay | Admitting: Psychology

## 2007-03-31 ENCOUNTER — Telehealth: Payer: Self-pay | Admitting: Internal Medicine

## 2007-04-13 ENCOUNTER — Ambulatory Visit: Payer: Self-pay | Admitting: Psychology

## 2007-04-17 ENCOUNTER — Ambulatory Visit (HOSPITAL_COMMUNITY): Payer: Self-pay | Admitting: Psychiatry

## 2007-04-27 DIAGNOSIS — Z950 Presence of cardiac pacemaker: Secondary | ICD-10-CM

## 2007-04-27 HISTORY — DX: Presence of cardiac pacemaker: Z95.0

## 2007-04-27 HISTORY — PX: INSERT / REPLACE / REMOVE PACEMAKER: SUR710

## 2007-05-04 ENCOUNTER — Ambulatory Visit: Payer: Self-pay | Admitting: Psychology

## 2007-05-05 ENCOUNTER — Ambulatory Visit: Payer: Self-pay | Admitting: Internal Medicine

## 2007-05-10 ENCOUNTER — Ambulatory Visit: Payer: Self-pay

## 2007-05-11 ENCOUNTER — Ambulatory Visit: Payer: Self-pay | Admitting: Psychology

## 2007-05-23 ENCOUNTER — Ambulatory Visit: Payer: Self-pay | Admitting: Internal Medicine

## 2007-05-23 DIAGNOSIS — E559 Vitamin D deficiency, unspecified: Secondary | ICD-10-CM | POA: Insufficient documentation

## 2007-05-23 DIAGNOSIS — F419 Anxiety disorder, unspecified: Secondary | ICD-10-CM | POA: Insufficient documentation

## 2007-05-23 DIAGNOSIS — F411 Generalized anxiety disorder: Secondary | ICD-10-CM | POA: Insufficient documentation

## 2007-05-24 ENCOUNTER — Encounter: Payer: Self-pay | Admitting: Internal Medicine

## 2007-05-24 ENCOUNTER — Ambulatory Visit: Payer: Self-pay

## 2007-05-25 ENCOUNTER — Ambulatory Visit: Payer: Self-pay | Admitting: Psychology

## 2007-06-01 ENCOUNTER — Ambulatory Visit: Payer: Self-pay | Admitting: Psychology

## 2007-06-05 ENCOUNTER — Ambulatory Visit: Admission: RE | Admit: 2007-06-05 | Discharge: 2007-06-05 | Payer: Self-pay | Admitting: Internal Medicine

## 2007-06-12 ENCOUNTER — Ambulatory Visit (HOSPITAL_COMMUNITY): Payer: Self-pay | Admitting: Psychiatry

## 2007-06-15 ENCOUNTER — Ambulatory Visit: Payer: Self-pay | Admitting: Psychology

## 2007-06-22 ENCOUNTER — Ambulatory Visit: Payer: Self-pay | Admitting: Psychology

## 2007-06-29 ENCOUNTER — Ambulatory Visit: Payer: Self-pay | Admitting: Psychology

## 2007-06-30 ENCOUNTER — Ambulatory Visit: Payer: Self-pay | Admitting: Endocrinology

## 2007-06-30 DIAGNOSIS — L02519 Cutaneous abscess of unspecified hand: Secondary | ICD-10-CM | POA: Insufficient documentation

## 2007-06-30 DIAGNOSIS — L03019 Cellulitis of unspecified finger: Secondary | ICD-10-CM

## 2007-07-06 ENCOUNTER — Ambulatory Visit: Payer: Self-pay | Admitting: Psychology

## 2007-07-13 ENCOUNTER — Ambulatory Visit: Payer: Self-pay | Admitting: Psychology

## 2007-07-20 ENCOUNTER — Ambulatory Visit: Payer: Self-pay | Admitting: Psychology

## 2007-07-27 ENCOUNTER — Ambulatory Visit: Payer: Self-pay | Admitting: Psychology

## 2007-07-28 ENCOUNTER — Emergency Department (HOSPITAL_COMMUNITY): Admission: EM | Admit: 2007-07-28 | Discharge: 2007-07-28 | Payer: Self-pay | Admitting: Emergency Medicine

## 2007-08-01 ENCOUNTER — Ambulatory Visit: Payer: Self-pay | Admitting: Internal Medicine

## 2007-08-01 DIAGNOSIS — M25559 Pain in unspecified hip: Secondary | ICD-10-CM | POA: Insufficient documentation

## 2007-08-03 ENCOUNTER — Ambulatory Visit: Payer: Self-pay | Admitting: Psychology

## 2007-08-07 ENCOUNTER — Ambulatory Visit: Payer: Self-pay | Admitting: Psychology

## 2007-08-08 ENCOUNTER — Telehealth: Payer: Self-pay | Admitting: Internal Medicine

## 2007-08-14 ENCOUNTER — Ambulatory Visit: Payer: Self-pay | Admitting: Psychology

## 2007-08-15 ENCOUNTER — Ambulatory Visit: Payer: Self-pay | Admitting: Internal Medicine

## 2007-08-15 LAB — CONVERTED CEMR LAB: Vit D, 1,25-Dihydroxy: 33 (ref 30–89)

## 2007-08-16 LAB — CONVERTED CEMR LAB
CO2: 32 meq/L (ref 19–32)
Calcium: 9.2 mg/dL (ref 8.4–10.5)
Chloride: 104 meq/L (ref 96–112)
GFR calc non Af Amer: 65 mL/min
Mucus, UA: NEGATIVE
Nitrite: NEGATIVE
Sodium: 142 meq/L (ref 135–145)
TSH: 1.14 microintl units/mL (ref 0.35–5.50)
Total Protein, Urine: NEGATIVE mg/dL
pH: 8.5 — AB (ref 5.0–8.0)

## 2007-08-21 ENCOUNTER — Ambulatory Visit: Payer: Self-pay | Admitting: Internal Medicine

## 2007-08-21 ENCOUNTER — Ambulatory Visit: Payer: Self-pay | Admitting: Psychology

## 2007-08-21 ENCOUNTER — Ambulatory Visit (HOSPITAL_COMMUNITY): Payer: Self-pay | Admitting: Psychiatry

## 2007-08-21 DIAGNOSIS — R21 Rash and other nonspecific skin eruption: Secondary | ICD-10-CM | POA: Insufficient documentation

## 2007-08-21 DIAGNOSIS — R3129 Other microscopic hematuria: Secondary | ICD-10-CM | POA: Insufficient documentation

## 2007-08-22 ENCOUNTER — Encounter: Admission: RE | Admit: 2007-08-22 | Discharge: 2007-11-03 | Payer: Self-pay | Admitting: Internal Medicine

## 2007-08-23 LAB — CONVERTED CEMR LAB: Anti Nuclear Antibody(ANA): NEGATIVE

## 2007-08-28 ENCOUNTER — Encounter: Admission: RE | Admit: 2007-08-28 | Discharge: 2007-08-28 | Payer: Self-pay | Admitting: Internal Medicine

## 2007-09-04 ENCOUNTER — Ambulatory Visit: Payer: Self-pay | Admitting: Psychology

## 2007-09-08 ENCOUNTER — Telehealth: Payer: Self-pay | Admitting: Internal Medicine

## 2007-09-11 ENCOUNTER — Encounter: Payer: Self-pay | Admitting: Internal Medicine

## 2007-09-11 ENCOUNTER — Ambulatory Visit: Payer: Self-pay | Admitting: Psychology

## 2007-09-12 ENCOUNTER — Telehealth (INDEPENDENT_AMBULATORY_CARE_PROVIDER_SITE_OTHER): Payer: Self-pay | Admitting: *Deleted

## 2007-09-18 ENCOUNTER — Ambulatory Visit: Payer: Self-pay | Admitting: Internal Medicine

## 2007-09-18 ENCOUNTER — Inpatient Hospital Stay (HOSPITAL_COMMUNITY): Admission: EM | Admit: 2007-09-18 | Discharge: 2007-09-21 | Payer: Self-pay | Admitting: Emergency Medicine

## 2007-09-20 ENCOUNTER — Encounter: Payer: Self-pay | Admitting: Internal Medicine

## 2007-09-21 ENCOUNTER — Emergency Department (HOSPITAL_COMMUNITY): Admission: EM | Admit: 2007-09-21 | Discharge: 2007-09-21 | Payer: Self-pay | Admitting: Emergency Medicine

## 2007-09-25 ENCOUNTER — Telehealth: Payer: Self-pay | Admitting: Internal Medicine

## 2007-10-04 ENCOUNTER — Ambulatory Visit: Payer: Self-pay | Admitting: Internal Medicine

## 2007-10-04 DIAGNOSIS — R55 Syncope and collapse: Secondary | ICD-10-CM | POA: Insufficient documentation

## 2007-10-05 ENCOUNTER — Ambulatory Visit: Payer: Self-pay

## 2007-10-09 ENCOUNTER — Ambulatory Visit: Payer: Self-pay | Admitting: Psychology

## 2007-10-20 ENCOUNTER — Encounter: Payer: Self-pay | Admitting: Internal Medicine

## 2007-10-20 ENCOUNTER — Ambulatory Visit: Payer: Self-pay

## 2007-10-23 ENCOUNTER — Ambulatory Visit: Payer: Self-pay | Admitting: Psychology

## 2007-10-23 ENCOUNTER — Ambulatory Visit (HOSPITAL_COMMUNITY): Payer: Self-pay | Admitting: Psychiatry

## 2007-10-26 ENCOUNTER — Ambulatory Visit: Payer: Self-pay | Admitting: Internal Medicine

## 2007-10-30 ENCOUNTER — Ambulatory Visit: Payer: Self-pay | Admitting: Oncology

## 2007-10-30 ENCOUNTER — Ambulatory Visit: Payer: Self-pay | Admitting: Psychology

## 2007-10-30 LAB — CBC WITH DIFFERENTIAL/PLATELET
Basophils Absolute: 0 10*3/uL (ref 0.0–0.1)
Eosinophils Absolute: 0.1 10*3/uL (ref 0.0–0.5)
HCT: 40.2 % (ref 34.8–46.6)
HGB: 13.7 g/dL (ref 11.6–15.9)
MCH: 31.3 pg (ref 26.0–34.0)
MCV: 91.5 fL (ref 81.0–101.0)
NEUT#: 2.9 10*3/uL (ref 1.5–6.5)
NEUT%: 62.1 % (ref 39.6–76.8)
RDW: 13.9 % (ref 11.3–14.5)
lymph#: 1.2 10*3/uL (ref 0.9–3.3)

## 2007-10-30 LAB — COMPREHENSIVE METABOLIC PANEL
Albumin: 4.1 g/dL (ref 3.5–5.2)
BUN: 19 mg/dL (ref 6–23)
CO2: 27 mEq/L (ref 19–32)
Calcium: 9.3 mg/dL (ref 8.4–10.5)
Chloride: 98 mEq/L (ref 96–112)
Creatinine, Ser: 1.09 mg/dL (ref 0.40–1.20)
Glucose, Bld: 104 mg/dL — ABNORMAL HIGH (ref 70–99)
Potassium: 3.7 mEq/L (ref 3.5–5.3)

## 2007-10-30 LAB — LACTATE DEHYDROGENASE: LDH: 161 U/L (ref 94–250)

## 2007-11-03 ENCOUNTER — Encounter: Payer: Self-pay | Admitting: Internal Medicine

## 2007-11-06 ENCOUNTER — Ambulatory Visit: Payer: Self-pay | Admitting: Psychology

## 2007-11-16 ENCOUNTER — Encounter: Payer: Self-pay | Admitting: Internal Medicine

## 2007-11-20 ENCOUNTER — Ambulatory Visit: Payer: Self-pay | Admitting: Psychology

## 2007-11-27 ENCOUNTER — Ambulatory Visit: Payer: Self-pay

## 2007-11-27 ENCOUNTER — Ambulatory Visit: Payer: Self-pay | Admitting: Internal Medicine

## 2007-11-27 LAB — CONVERTED CEMR LAB
BUN: 18 mg/dL (ref 6–23)
CO2: 34 meq/L — ABNORMAL HIGH (ref 19–32)
Calcium: 9.2 mg/dL (ref 8.4–10.5)
Creatinine, Ser: 1 mg/dL (ref 0.4–1.2)

## 2007-12-01 ENCOUNTER — Ambulatory Visit: Payer: Self-pay | Admitting: Internal Medicine

## 2007-12-01 DIAGNOSIS — R635 Abnormal weight gain: Secondary | ICD-10-CM | POA: Insufficient documentation

## 2007-12-04 ENCOUNTER — Ambulatory Visit: Payer: Self-pay | Admitting: Psychology

## 2007-12-11 ENCOUNTER — Ambulatory Visit: Payer: Self-pay | Admitting: Internal Medicine

## 2007-12-11 LAB — CONVERTED CEMR LAB
BUN: 13 mg/dL (ref 6–23)
Basophils Absolute: 0 10*3/uL (ref 0.0–0.1)
CO2: 32 meq/L (ref 19–32)
Calcium: 9.3 mg/dL (ref 8.4–10.5)
Creatinine, Ser: 1 mg/dL (ref 0.4–1.2)
Eosinophils Absolute: 0.1 10*3/uL (ref 0.0–0.7)
Glucose, Bld: 78 mg/dL (ref 70–99)
MCHC: 33.6 g/dL (ref 30.0–36.0)
MCV: 94.1 fL (ref 78.0–100.0)
Monocytes Absolute: 0.4 10*3/uL (ref 0.1–1.0)
Neutrophils Relative %: 60.1 % (ref 43.0–77.0)
Platelets: 239 10*3/uL (ref 150–400)
RDW: 13.7 % (ref 11.5–14.6)
TSH: 1.31 microintl units/mL (ref 0.35–5.50)
WBC: 4.1 10*3/uL — ABNORMAL LOW (ref 4.5–10.5)

## 2007-12-14 ENCOUNTER — Ambulatory Visit: Payer: Self-pay | Admitting: Internal Medicine

## 2007-12-18 ENCOUNTER — Ambulatory Visit: Payer: Self-pay | Admitting: Psychology

## 2007-12-20 ENCOUNTER — Telehealth: Payer: Self-pay | Admitting: Internal Medicine

## 2007-12-25 ENCOUNTER — Ambulatory Visit: Payer: Self-pay | Admitting: Internal Medicine

## 2007-12-25 ENCOUNTER — Ambulatory Visit: Payer: Self-pay | Admitting: Psychology

## 2007-12-25 ENCOUNTER — Ambulatory Visit (HOSPITAL_COMMUNITY): Payer: Self-pay | Admitting: Psychiatry

## 2007-12-25 LAB — CONVERTED CEMR LAB
BUN: 24 mg/dL — ABNORMAL HIGH (ref 6–23)
Chloride: 104 meq/L (ref 96–112)
Eosinophils Relative: 2.8 % (ref 0.0–5.0)
GFR calc Af Amer: 62 mL/min
Glucose, Bld: 94 mg/dL (ref 70–99)
HCT: 39.2 % (ref 36.0–46.0)
Hemoglobin: 13.5 g/dL (ref 12.0–15.0)
Monocytes Absolute: 0.4 10*3/uL (ref 0.1–1.0)
Monocytes Relative: 8.8 % (ref 3.0–12.0)
Platelets: 255 10*3/uL (ref 150–400)
Potassium: 4.2 meq/L (ref 3.5–5.1)
RBC: 4.18 M/uL (ref 3.87–5.11)
WBC: 4.8 10*3/uL (ref 4.5–10.5)

## 2007-12-28 ENCOUNTER — Other Ambulatory Visit (HOSPITAL_COMMUNITY): Admission: RE | Admit: 2007-12-28 | Discharge: 2008-03-27 | Payer: Self-pay | Admitting: Psychiatry

## 2007-12-28 ENCOUNTER — Ambulatory Visit: Payer: Self-pay | Admitting: Psychiatry

## 2008-01-02 ENCOUNTER — Ambulatory Visit: Payer: Self-pay | Admitting: Internal Medicine

## 2008-01-15 ENCOUNTER — Encounter: Payer: Self-pay | Admitting: Internal Medicine

## 2008-01-22 ENCOUNTER — Telehealth: Payer: Self-pay | Admitting: Internal Medicine

## 2008-01-26 ENCOUNTER — Ambulatory Visit (HOSPITAL_COMMUNITY): Payer: Self-pay | Admitting: Psychiatry

## 2008-01-29 ENCOUNTER — Ambulatory Visit: Payer: Self-pay | Admitting: Internal Medicine

## 2008-01-29 ENCOUNTER — Ambulatory Visit: Payer: Self-pay | Admitting: Psychology

## 2008-01-29 DIAGNOSIS — N3090 Cystitis, unspecified without hematuria: Secondary | ICD-10-CM | POA: Insufficient documentation

## 2008-01-29 DIAGNOSIS — I951 Orthostatic hypotension: Secondary | ICD-10-CM | POA: Insufficient documentation

## 2008-01-31 LAB — CONVERTED CEMR LAB
Chloride: 103 meq/L (ref 96–112)
Creatinine, Ser: 1 mg/dL (ref 0.4–1.2)
Eosinophils Relative: 3 % (ref 0.0–5.0)
GFR calc non Af Amer: 57 mL/min
HCT: 36.6 % (ref 36.0–46.0)
Hemoglobin: 12.8 g/dL (ref 12.0–15.0)
Monocytes Absolute: 0.3 10*3/uL (ref 0.1–1.0)
Monocytes Relative: 7.7 % (ref 3.0–12.0)
Neutro Abs: 2.6 10*3/uL (ref 1.4–7.7)
Nitrite: NEGATIVE
RBC: 3.88 M/uL (ref 3.87–5.11)
Sodium: 142 meq/L (ref 135–145)
TSH: 1.37 microintl units/mL (ref 0.35–5.50)
Total Protein, Urine: NEGATIVE mg/dL
WBC: 4.4 10*3/uL — ABNORMAL LOW (ref 4.5–10.5)
pH: 7 (ref 5.0–8.0)

## 2008-02-02 ENCOUNTER — Encounter: Payer: Self-pay | Admitting: Internal Medicine

## 2008-02-05 ENCOUNTER — Ambulatory Visit: Payer: Self-pay | Admitting: Psychology

## 2008-02-12 ENCOUNTER — Ambulatory Visit: Payer: Self-pay | Admitting: Psychology

## 2008-02-19 ENCOUNTER — Ambulatory Visit: Payer: Self-pay | Admitting: Psychology

## 2008-02-26 ENCOUNTER — Ambulatory Visit: Payer: Self-pay | Admitting: Psychology

## 2008-02-29 ENCOUNTER — Ambulatory Visit: Payer: Self-pay | Admitting: Internal Medicine

## 2008-03-01 ENCOUNTER — Encounter: Payer: Self-pay | Admitting: Internal Medicine

## 2008-03-04 ENCOUNTER — Ambulatory Visit (HOSPITAL_COMMUNITY): Payer: Self-pay | Admitting: Psychiatry

## 2008-03-04 LAB — CONVERTED CEMR LAB
BUN: 17 mg/dL (ref 6–23)
Bilirubin Urine: NEGATIVE
Calcium: 9.1 mg/dL (ref 8.4–10.5)
Creatinine, Ser: 1 mg/dL (ref 0.4–1.2)
Crystals: NEGATIVE
GFR calc Af Amer: 69 mL/min
GFR calc non Af Amer: 57 mL/min
Ketones, ur: 15 mg/dL — AB
Nitrite: NEGATIVE
Specific Gravity, Urine: 1.01 (ref 1.000–1.03)
Urobilinogen, UA: 0.2 (ref 0.0–1.0)

## 2008-03-05 ENCOUNTER — Ambulatory Visit: Payer: Self-pay | Admitting: Internal Medicine

## 2008-03-25 ENCOUNTER — Ambulatory Visit: Payer: Self-pay | Admitting: Psychology

## 2008-04-01 ENCOUNTER — Ambulatory Visit: Payer: Self-pay | Admitting: Psychology

## 2008-04-08 ENCOUNTER — Ambulatory Visit: Payer: Self-pay | Admitting: Internal Medicine

## 2008-04-15 ENCOUNTER — Ambulatory Visit: Payer: Self-pay | Admitting: Psychology

## 2008-04-22 ENCOUNTER — Ambulatory Visit: Payer: Self-pay | Admitting: Psychology

## 2008-04-29 ENCOUNTER — Ambulatory Visit: Payer: Self-pay | Admitting: Psychology

## 2008-05-06 ENCOUNTER — Ambulatory Visit: Payer: Self-pay | Admitting: Internal Medicine

## 2008-05-06 DIAGNOSIS — R42 Dizziness and giddiness: Secondary | ICD-10-CM | POA: Insufficient documentation

## 2008-05-13 ENCOUNTER — Ambulatory Visit: Payer: Self-pay | Admitting: Psychology

## 2008-05-20 ENCOUNTER — Ambulatory Visit: Payer: Self-pay | Admitting: Psychology

## 2008-05-23 ENCOUNTER — Encounter: Payer: Self-pay | Admitting: Internal Medicine

## 2008-06-03 ENCOUNTER — Ambulatory Visit: Payer: Self-pay | Admitting: Psychology

## 2008-06-05 ENCOUNTER — Encounter: Payer: Self-pay | Admitting: Internal Medicine

## 2008-06-05 ENCOUNTER — Ambulatory Visit (HOSPITAL_COMMUNITY): Payer: Self-pay | Admitting: Psychiatry

## 2008-06-17 ENCOUNTER — Ambulatory Visit: Payer: Self-pay | Admitting: Psychology

## 2008-06-20 ENCOUNTER — Ambulatory Visit: Payer: Self-pay | Admitting: Internal Medicine

## 2008-06-21 ENCOUNTER — Telehealth: Payer: Self-pay | Admitting: Internal Medicine

## 2008-07-01 ENCOUNTER — Ambulatory Visit: Payer: Self-pay | Admitting: Psychology

## 2008-07-15 ENCOUNTER — Ambulatory Visit: Payer: Self-pay | Admitting: Psychology

## 2008-07-22 ENCOUNTER — Ambulatory Visit: Payer: Self-pay | Admitting: Psychology

## 2008-07-29 ENCOUNTER — Ambulatory Visit (HOSPITAL_COMMUNITY): Payer: Self-pay | Admitting: Psychiatry

## 2008-08-05 ENCOUNTER — Ambulatory Visit: Payer: Self-pay | Admitting: Internal Medicine

## 2008-08-05 ENCOUNTER — Ambulatory Visit: Payer: Self-pay | Admitting: Psychology

## 2008-08-05 ENCOUNTER — Encounter: Payer: Self-pay | Admitting: Internal Medicine

## 2008-08-05 LAB — CONVERTED CEMR LAB
BUN: 16 mg/dL (ref 6–23)
CO2: 34 meq/L — ABNORMAL HIGH (ref 19–32)
Chloride: 104 meq/L (ref 96–112)
Glucose, Bld: 88 mg/dL (ref 70–99)
Potassium: 3.9 meq/L (ref 3.5–5.1)
TSH: 1.92 microintl units/mL (ref 0.35–5.50)

## 2008-08-12 ENCOUNTER — Ambulatory Visit: Payer: Self-pay | Admitting: Psychology

## 2008-08-13 ENCOUNTER — Telehealth: Payer: Self-pay | Admitting: Internal Medicine

## 2008-08-14 ENCOUNTER — Ambulatory Visit: Payer: Self-pay | Admitting: Internal Medicine

## 2008-08-14 DIAGNOSIS — J029 Acute pharyngitis, unspecified: Secondary | ICD-10-CM | POA: Insufficient documentation

## 2008-08-19 ENCOUNTER — Telehealth (INDEPENDENT_AMBULATORY_CARE_PROVIDER_SITE_OTHER): Payer: Self-pay | Admitting: *Deleted

## 2008-08-26 ENCOUNTER — Ambulatory Visit: Payer: Self-pay | Admitting: Psychology

## 2008-09-02 ENCOUNTER — Ambulatory Visit: Payer: Self-pay | Admitting: Psychology

## 2008-09-17 ENCOUNTER — Ambulatory Visit: Payer: Self-pay | Admitting: Internal Medicine

## 2008-09-17 DIAGNOSIS — I495 Sick sinus syndrome: Secondary | ICD-10-CM | POA: Insufficient documentation

## 2008-09-27 ENCOUNTER — Ambulatory Visit: Payer: Self-pay | Admitting: Internal Medicine

## 2008-09-27 DIAGNOSIS — R0989 Other specified symptoms and signs involving the circulatory and respiratory systems: Secondary | ICD-10-CM

## 2008-09-27 DIAGNOSIS — R0609 Other forms of dyspnea: Secondary | ICD-10-CM | POA: Insufficient documentation

## 2008-09-27 LAB — CONVERTED CEMR LAB
ALT: 17 units/L (ref 0–35)
AST: 23 units/L (ref 0–37)
Albumin: 3.8 g/dL (ref 3.5–5.2)
BUN: 20 mg/dL (ref 6–23)
Basophils Absolute: 0 10*3/uL (ref 0.0–0.1)
Chloride: 110 meq/L (ref 96–112)
Glucose, Bld: 97 mg/dL (ref 70–99)
HCT: 38.4 % (ref 36.0–46.0)
HDL: 50.8 mg/dL (ref >39.00)
Hemoglobin: 13.4 g/dL (ref 12.0–15.0)
Lymphs Abs: 1.2 10*3/uL (ref 0.7–4.0)
Monocytes Relative: 8 % (ref 3.0–12.0)
Neutro Abs: 2.8 10*3/uL (ref 1.4–7.7)
Nitrite: NEGATIVE
Potassium: 4.2 meq/L (ref 3.5–5.1)
Pro B Natriuretic peptide (BNP): 113 pg/mL — ABNORMAL HIGH (ref 0.0–100.0)
RDW: 13.8 % (ref 11.5–14.6)
Specific Gravity, Urine: 1.02 (ref 1.000–1.030)
TSH: 1.13 microintl units/mL (ref 0.35–5.50)
pH: 5.5 (ref 5.0–8.0)

## 2008-09-30 ENCOUNTER — Ambulatory Visit (HOSPITAL_COMMUNITY): Payer: Self-pay | Admitting: Psychiatry

## 2008-09-30 ENCOUNTER — Ambulatory Visit: Payer: Self-pay | Admitting: Psychology

## 2008-10-01 ENCOUNTER — Telehealth: Payer: Self-pay | Admitting: Internal Medicine

## 2008-10-04 ENCOUNTER — Telehealth: Payer: Self-pay | Admitting: Internal Medicine

## 2008-10-07 ENCOUNTER — Encounter: Payer: Self-pay | Admitting: Internal Medicine

## 2008-10-07 ENCOUNTER — Ambulatory Visit: Payer: Self-pay | Admitting: Psychology

## 2008-10-11 ENCOUNTER — Encounter: Payer: Self-pay | Admitting: Internal Medicine

## 2008-10-14 ENCOUNTER — Ambulatory Visit: Payer: Self-pay | Admitting: Internal Medicine

## 2008-10-15 ENCOUNTER — Telehealth: Payer: Self-pay | Admitting: Internal Medicine

## 2008-10-15 ENCOUNTER — Encounter: Payer: Self-pay | Admitting: Internal Medicine

## 2008-10-15 ENCOUNTER — Encounter (INDEPENDENT_AMBULATORY_CARE_PROVIDER_SITE_OTHER): Payer: Self-pay | Admitting: *Deleted

## 2008-10-23 ENCOUNTER — Ambulatory Visit: Payer: Self-pay | Admitting: Oncology

## 2008-10-30 LAB — COMPREHENSIVE METABOLIC PANEL
ALT: 21 U/L (ref 0–35)
AST: 26 U/L (ref 0–37)
BUN: 17 mg/dL (ref 6–23)
Calcium: 9.2 mg/dL (ref 8.4–10.5)
Chloride: 95 mEq/L — ABNORMAL LOW (ref 96–112)
Creatinine, Ser: 0.93 mg/dL (ref 0.40–1.20)
Total Bilirubin: 0.9 mg/dL (ref 0.3–1.2)

## 2008-10-30 LAB — CBC WITH DIFFERENTIAL/PLATELET
BASO%: 0.5 % (ref 0.0–2.0)
Basophils Absolute: 0 10*3/uL (ref 0.0–0.1)
EOS%: 1.7 % (ref 0.0–7.0)
HCT: 40 % (ref 34.8–46.6)
HGB: 13.7 g/dL (ref 11.6–15.9)
LYMPH%: 25.3 % (ref 14.0–49.7)
MCH: 32.7 pg (ref 25.1–34.0)
MCHC: 34.2 g/dL (ref 31.5–36.0)
MCV: 95.6 fL (ref 79.5–101.0)
NEUT%: 60.6 % (ref 38.4–76.8)
Platelets: 234 10*3/uL (ref 145–400)

## 2008-10-30 LAB — LACTATE DEHYDROGENASE: LDH: 145 U/L (ref 94–250)

## 2008-11-01 ENCOUNTER — Encounter: Payer: Self-pay | Admitting: Internal Medicine

## 2008-11-04 ENCOUNTER — Ambulatory Visit: Payer: Self-pay | Admitting: Psychology

## 2008-11-18 ENCOUNTER — Ambulatory Visit: Payer: Self-pay | Admitting: Psychology

## 2008-11-25 ENCOUNTER — Ambulatory Visit: Payer: Self-pay | Admitting: Psychology

## 2008-11-25 ENCOUNTER — Ambulatory Visit (HOSPITAL_COMMUNITY): Payer: Self-pay | Admitting: Psychiatry

## 2008-12-02 ENCOUNTER — Ambulatory Visit: Payer: Self-pay | Admitting: Psychology

## 2008-12-18 ENCOUNTER — Ambulatory Visit: Payer: Self-pay | Admitting: Internal Medicine

## 2008-12-23 ENCOUNTER — Ambulatory Visit: Payer: Self-pay | Admitting: Psychology

## 2008-12-31 ENCOUNTER — Encounter: Payer: Self-pay | Admitting: Internal Medicine

## 2009-01-06 ENCOUNTER — Ambulatory Visit: Payer: Self-pay | Admitting: Psychology

## 2009-01-09 ENCOUNTER — Ambulatory Visit: Payer: Self-pay | Admitting: Internal Medicine

## 2009-01-09 LAB — CONVERTED CEMR LAB
Calcium: 9.5 mg/dL (ref 8.4–10.5)
Creatinine, Ser: 1 mg/dL (ref 0.4–1.2)
Pro B Natriuretic peptide (BNP): 99 pg/mL (ref 0.0–100.0)
TSH: 1.21 microintl units/mL (ref 0.35–5.50)

## 2009-01-10 ENCOUNTER — Ambulatory Visit: Payer: Self-pay | Admitting: Internal Medicine

## 2009-01-13 ENCOUNTER — Ambulatory Visit: Payer: Self-pay | Admitting: Internal Medicine

## 2009-01-13 DIAGNOSIS — R7309 Other abnormal glucose: Secondary | ICD-10-CM | POA: Insufficient documentation

## 2009-01-16 ENCOUNTER — Telehealth: Payer: Self-pay | Admitting: Internal Medicine

## 2009-01-20 ENCOUNTER — Ambulatory Visit: Payer: Self-pay | Admitting: Psychology

## 2009-01-27 ENCOUNTER — Ambulatory Visit (HOSPITAL_COMMUNITY): Payer: Self-pay | Admitting: Psychiatry

## 2009-01-27 ENCOUNTER — Ambulatory Visit: Payer: Self-pay | Admitting: Psychology

## 2009-02-10 ENCOUNTER — Ambulatory Visit (HOSPITAL_COMMUNITY): Payer: Self-pay | Admitting: Psychiatry

## 2009-02-10 ENCOUNTER — Ambulatory Visit: Payer: Self-pay | Admitting: Psychology

## 2009-02-11 ENCOUNTER — Encounter: Admission: RE | Admit: 2009-02-11 | Discharge: 2009-02-11 | Payer: Self-pay | Admitting: Surgery

## 2009-02-21 ENCOUNTER — Ambulatory Visit: Payer: Self-pay | Admitting: Internal Medicine

## 2009-02-24 ENCOUNTER — Ambulatory Visit: Payer: Self-pay | Admitting: Psychology

## 2009-02-24 ENCOUNTER — Ambulatory Visit: Payer: Self-pay | Admitting: Internal Medicine

## 2009-02-24 ENCOUNTER — Telehealth: Payer: Self-pay | Admitting: Internal Medicine

## 2009-02-28 ENCOUNTER — Encounter: Payer: Self-pay | Admitting: Internal Medicine

## 2009-02-28 LAB — CONVERTED CEMR LAB
Total CHOL/HDL Ratio: 4
VLDL: 12.8 mg/dL (ref 0.0–40.0)

## 2009-03-04 ENCOUNTER — Ambulatory Visit: Payer: Self-pay | Admitting: Internal Medicine

## 2009-03-10 ENCOUNTER — Ambulatory Visit: Payer: Self-pay | Admitting: Psychology

## 2009-03-24 ENCOUNTER — Ambulatory Visit: Payer: Self-pay | Admitting: Psychology

## 2009-03-31 ENCOUNTER — Telehealth: Payer: Self-pay | Admitting: Internal Medicine

## 2009-03-31 ENCOUNTER — Ambulatory Visit: Payer: Self-pay | Admitting: Psychology

## 2009-04-03 ENCOUNTER — Encounter: Admission: RE | Admit: 2009-04-03 | Discharge: 2009-04-23 | Payer: Self-pay | Admitting: Surgery

## 2009-04-04 ENCOUNTER — Ambulatory Visit: Payer: Self-pay | Admitting: Internal Medicine

## 2009-04-07 LAB — CONVERTED CEMR LAB
AST: 26 units/L (ref 0–37)
Albumin: 3.6 g/dL (ref 3.5–5.2)
Alkaline Phosphatase: 68 units/L (ref 39–117)
Bilirubin, Direct: 0.2 mg/dL (ref 0.0–0.3)
CO2: 32 meq/L (ref 19–32)
GFR calc non Af Amer: 69.11 mL/min (ref >60)
Glucose, Bld: 87 mg/dL (ref 70–99)
Hgb A1c MFr Bld: 5.8 % (ref 4.6–6.5)
Potassium: 3 meq/L — ABNORMAL LOW (ref 3.5–5.1)
Sodium: 142 meq/L (ref 135–145)
Total CHOL/HDL Ratio: 4
VLDL: 15.4 mg/dL (ref 0.0–40.0)

## 2009-04-22 ENCOUNTER — Ambulatory Visit: Payer: Self-pay | Admitting: Internal Medicine

## 2009-04-22 DIAGNOSIS — E876 Hypokalemia: Secondary | ICD-10-CM | POA: Insufficient documentation

## 2009-04-28 ENCOUNTER — Ambulatory Visit (HOSPITAL_COMMUNITY): Payer: Self-pay | Admitting: Psychiatry

## 2009-04-28 ENCOUNTER — Ambulatory Visit: Payer: Self-pay | Admitting: Psychology

## 2009-05-05 ENCOUNTER — Ambulatory Visit: Payer: Self-pay | Admitting: Psychology

## 2009-05-19 ENCOUNTER — Ambulatory Visit: Payer: Self-pay | Admitting: Psychology

## 2009-05-20 ENCOUNTER — Encounter: Admission: RE | Admit: 2009-05-20 | Discharge: 2009-08-18 | Payer: Self-pay | Admitting: Surgery

## 2009-05-26 ENCOUNTER — Ambulatory Visit: Payer: Self-pay | Admitting: Psychology

## 2009-06-02 ENCOUNTER — Ambulatory Visit: Payer: Self-pay | Admitting: Psychology

## 2009-06-09 ENCOUNTER — Ambulatory Visit: Payer: Self-pay | Admitting: Psychology

## 2009-06-10 ENCOUNTER — Ambulatory Visit: Payer: Self-pay | Admitting: Internal Medicine

## 2009-06-15 ENCOUNTER — Encounter: Payer: Self-pay | Admitting: Internal Medicine

## 2009-06-16 ENCOUNTER — Ambulatory Visit: Payer: Self-pay | Admitting: Psychology

## 2009-06-16 ENCOUNTER — Encounter: Payer: Self-pay | Admitting: Internal Medicine

## 2009-06-23 ENCOUNTER — Ambulatory Visit: Payer: Self-pay | Admitting: Psychology

## 2009-06-30 ENCOUNTER — Encounter: Payer: Self-pay | Admitting: Internal Medicine

## 2009-07-01 ENCOUNTER — Encounter: Payer: Self-pay | Admitting: Internal Medicine

## 2009-07-02 ENCOUNTER — Encounter: Payer: Self-pay | Admitting: Internal Medicine

## 2009-07-02 ENCOUNTER — Telehealth (INDEPENDENT_AMBULATORY_CARE_PROVIDER_SITE_OTHER): Payer: Self-pay | Admitting: *Deleted

## 2009-07-03 ENCOUNTER — Telehealth: Payer: Self-pay | Admitting: Internal Medicine

## 2009-07-07 ENCOUNTER — Ambulatory Visit: Payer: Self-pay | Admitting: Psychology

## 2009-07-08 ENCOUNTER — Ambulatory Visit: Payer: Self-pay | Admitting: Internal Medicine

## 2009-07-08 LAB — CONVERTED CEMR LAB
CO2: 34 meq/L — ABNORMAL HIGH (ref 19–32)
Calcium: 8.9 mg/dL (ref 8.4–10.5)
Creatinine, Ser: 1 mg/dL (ref 0.4–1.2)
GFR calc non Af Amer: 69.06 mL/min (ref >60)
Sodium: 138 meq/L (ref 135–145)

## 2009-07-15 ENCOUNTER — Ambulatory Visit: Payer: Self-pay | Admitting: Internal Medicine

## 2009-07-15 ENCOUNTER — Ambulatory Visit: Payer: Self-pay | Admitting: Psychology

## 2009-07-15 DIAGNOSIS — R252 Cramp and spasm: Secondary | ICD-10-CM | POA: Insufficient documentation

## 2009-07-18 ENCOUNTER — Ambulatory Visit (HOSPITAL_COMMUNITY): Payer: Self-pay | Admitting: Psychiatry

## 2009-07-21 ENCOUNTER — Telehealth: Payer: Self-pay | Admitting: Internal Medicine

## 2009-08-04 ENCOUNTER — Ambulatory Visit: Payer: Self-pay | Admitting: Psychology

## 2009-08-26 ENCOUNTER — Ambulatory Visit: Payer: Self-pay | Admitting: Internal Medicine

## 2009-08-26 DIAGNOSIS — I4891 Unspecified atrial fibrillation: Secondary | ICD-10-CM | POA: Insufficient documentation

## 2009-08-28 ENCOUNTER — Ambulatory Visit: Payer: Self-pay | Admitting: Psychology

## 2009-09-08 ENCOUNTER — Ambulatory Visit: Payer: Self-pay | Admitting: Psychology

## 2009-09-15 ENCOUNTER — Ambulatory Visit (HOSPITAL_COMMUNITY): Payer: Self-pay | Admitting: Psychiatry

## 2009-09-15 ENCOUNTER — Ambulatory Visit: Payer: Self-pay | Admitting: Psychology

## 2009-09-23 ENCOUNTER — Ambulatory Visit: Payer: Self-pay | Admitting: Psychology

## 2009-09-30 ENCOUNTER — Ambulatory Visit: Payer: Self-pay | Admitting: Psychology

## 2009-10-13 ENCOUNTER — Encounter: Admission: RE | Admit: 2009-10-13 | Discharge: 2010-01-11 | Payer: Self-pay | Admitting: Surgery

## 2009-10-14 ENCOUNTER — Ambulatory Visit: Payer: Self-pay | Admitting: Internal Medicine

## 2009-10-15 ENCOUNTER — Ambulatory Visit (HOSPITAL_COMMUNITY): Payer: Self-pay | Admitting: Psychiatry

## 2009-10-15 LAB — CONVERTED CEMR LAB
AST: 24 units/L (ref 0–37)
Alkaline Phosphatase: 80 units/L (ref 39–117)
Basophils Absolute: 0 10*3/uL (ref 0.0–0.1)
Bilirubin Urine: NEGATIVE
Bilirubin, Direct: 0.2 mg/dL (ref 0.0–0.3)
Calcium: 9.1 mg/dL (ref 8.4–10.5)
GFR calc non Af Amer: 73.22 mL/min (ref >60)
Glucose, Bld: 94 mg/dL (ref 70–99)
HCT: 40.8 % (ref 36.0–46.0)
HDL: 45.2 mg/dL (ref >39.00)
Hemoglobin, Urine: NEGATIVE
Hemoglobin: 13.7 g/dL (ref 12.0–15.0)
Hgb A1c MFr Bld: 5.8 % (ref 4.6–6.5)
Lymphs Abs: 0.9 10*3/uL (ref 0.7–4.0)
MCHC: 33.5 g/dL (ref 30.0–36.0)
MCV: 96.4 fL (ref 78.0–100.0)
Monocytes Absolute: 0.3 10*3/uL (ref 0.1–1.0)
Monocytes Relative: 8.8 % (ref 3.0–12.0)
Neutro Abs: 2.4 10*3/uL (ref 1.4–7.7)
Nitrite: NEGATIVE
Platelets: 210 10*3/uL (ref 150.0–400.0)
Potassium: 3.8 meq/L (ref 3.5–5.1)
RDW: 14.3 % (ref 11.5–14.6)
Sodium: 143 meq/L (ref 135–145)
TSH: 0.75 microintl units/mL (ref 0.35–5.50)
Total Bilirubin: 0.9 mg/dL (ref 0.3–1.2)
Total CHOL/HDL Ratio: 4
Total Protein, Urine: NEGATIVE mg/dL
VLDL: 16.6 mg/dL (ref 0.0–40.0)
pH: 7.5 (ref 5.0–8.0)

## 2009-10-16 ENCOUNTER — Ambulatory Visit: Payer: Self-pay | Admitting: Psychology

## 2009-10-17 ENCOUNTER — Ambulatory Visit: Payer: Self-pay | Admitting: Internal Medicine

## 2009-10-28 ENCOUNTER — Ambulatory Visit: Payer: Self-pay | Admitting: Psychology

## 2009-11-03 ENCOUNTER — Ambulatory Visit: Payer: Self-pay | Admitting: Psychology

## 2009-11-10 ENCOUNTER — Ambulatory Visit: Payer: Self-pay | Admitting: Psychology

## 2009-11-17 ENCOUNTER — Ambulatory Visit: Payer: Self-pay | Admitting: Psychology

## 2009-11-27 ENCOUNTER — Ambulatory Visit: Payer: Self-pay | Admitting: Internal Medicine

## 2009-12-01 ENCOUNTER — Ambulatory Visit: Payer: Self-pay | Admitting: Psychology

## 2009-12-10 ENCOUNTER — Ambulatory Visit (HOSPITAL_COMMUNITY): Payer: Self-pay | Admitting: Psychiatry

## 2009-12-12 ENCOUNTER — Encounter: Payer: Self-pay | Admitting: Internal Medicine

## 2009-12-13 ENCOUNTER — Emergency Department (HOSPITAL_COMMUNITY): Admission: EM | Admit: 2009-12-13 | Discharge: 2009-12-13 | Payer: Self-pay | Admitting: Emergency Medicine

## 2009-12-15 ENCOUNTER — Encounter: Payer: Self-pay | Admitting: Internal Medicine

## 2009-12-22 ENCOUNTER — Ambulatory Visit: Payer: Self-pay | Admitting: Psychology

## 2009-12-30 ENCOUNTER — Ambulatory Visit: Payer: Self-pay | Admitting: Psychology

## 2010-01-05 ENCOUNTER — Ambulatory Visit: Payer: Self-pay | Admitting: Psychology

## 2010-01-07 ENCOUNTER — Telehealth: Payer: Self-pay | Admitting: Internal Medicine

## 2010-01-13 ENCOUNTER — Ambulatory Visit: Payer: Self-pay | Admitting: Psychology

## 2010-01-19 ENCOUNTER — Ambulatory Visit: Payer: Self-pay | Admitting: Psychology

## 2010-01-20 ENCOUNTER — Encounter: Payer: Self-pay | Admitting: Internal Medicine

## 2010-01-26 ENCOUNTER — Ambulatory Visit: Payer: Self-pay | Admitting: Psychology

## 2010-01-29 ENCOUNTER — Ambulatory Visit: Payer: Self-pay | Admitting: Internal Medicine

## 2010-01-29 LAB — CONVERTED CEMR LAB
BUN: 18 mg/dL (ref 6–23)
Chloride: 101 meq/L (ref 96–112)
Creatinine, Ser: 1 mg/dL (ref 0.4–1.2)
Glucose, Bld: 101 mg/dL — ABNORMAL HIGH (ref 70–99)
Potassium: 4.6 meq/L (ref 3.5–5.1)

## 2010-02-02 ENCOUNTER — Ambulatory Visit: Payer: Self-pay | Admitting: Psychology

## 2010-02-02 ENCOUNTER — Ambulatory Visit (HOSPITAL_COMMUNITY): Payer: Self-pay | Admitting: Psychiatry

## 2010-02-03 ENCOUNTER — Ambulatory Visit: Payer: Self-pay | Admitting: Internal Medicine

## 2010-02-03 DIAGNOSIS — K602 Anal fissure, unspecified: Secondary | ICD-10-CM | POA: Insufficient documentation

## 2010-02-09 ENCOUNTER — Ambulatory Visit: Payer: Self-pay | Admitting: Psychology

## 2010-02-17 ENCOUNTER — Encounter: Payer: Self-pay | Admitting: Internal Medicine

## 2010-02-23 ENCOUNTER — Ambulatory Visit: Payer: Self-pay | Admitting: Psychology

## 2010-03-02 ENCOUNTER — Ambulatory Visit: Payer: Self-pay | Admitting: Psychology

## 2010-03-05 ENCOUNTER — Ambulatory Visit: Payer: Self-pay | Admitting: Internal Medicine

## 2010-03-05 DIAGNOSIS — I472 Ventricular tachycardia, unspecified: Secondary | ICD-10-CM | POA: Insufficient documentation

## 2010-03-16 ENCOUNTER — Ambulatory Visit: Payer: Self-pay | Admitting: Psychology

## 2010-03-30 ENCOUNTER — Ambulatory Visit: Payer: Self-pay | Admitting: Psychology

## 2010-04-06 ENCOUNTER — Ambulatory Visit: Payer: Self-pay | Admitting: Psychology

## 2010-04-06 ENCOUNTER — Ambulatory Visit (HOSPITAL_COMMUNITY): Payer: Self-pay | Admitting: Psychiatry

## 2010-04-13 ENCOUNTER — Ambulatory Visit: Payer: Self-pay | Admitting: Psychology

## 2010-04-29 ENCOUNTER — Encounter: Payer: Self-pay | Admitting: Internal Medicine

## 2010-04-29 ENCOUNTER — Ambulatory Visit: Admit: 2010-04-29 | Payer: Self-pay | Admitting: Psychology

## 2010-04-30 ENCOUNTER — Ambulatory Visit
Admission: RE | Admit: 2010-04-30 | Discharge: 2010-04-30 | Payer: Self-pay | Source: Home / Self Care | Attending: Internal Medicine | Admitting: Internal Medicine

## 2010-04-30 DIAGNOSIS — M109 Gout, unspecified: Secondary | ICD-10-CM | POA: Insufficient documentation

## 2010-05-04 ENCOUNTER — Ambulatory Visit
Admission: RE | Admit: 2010-05-04 | Discharge: 2010-05-04 | Payer: Self-pay | Source: Home / Self Care | Attending: Psychology | Admitting: Psychology

## 2010-05-11 ENCOUNTER — Ambulatory Visit
Admission: RE | Admit: 2010-05-11 | Discharge: 2010-05-11 | Payer: Self-pay | Source: Home / Self Care | Attending: Psychology | Admitting: Psychology

## 2010-05-11 ENCOUNTER — Ambulatory Visit
Admission: RE | Admit: 2010-05-11 | Discharge: 2010-05-11 | Payer: Self-pay | Source: Home / Self Care | Attending: Internal Medicine | Admitting: Internal Medicine

## 2010-05-11 ENCOUNTER — Other Ambulatory Visit: Payer: Self-pay | Admitting: Internal Medicine

## 2010-05-11 LAB — BASIC METABOLIC PANEL
BUN: 20 mg/dL (ref 6–23)
CO2: 28 mEq/L (ref 19–32)
Calcium: 8.9 mg/dL (ref 8.4–10.5)
Chloride: 101 mEq/L (ref 96–112)
Creatinine, Ser: 1 mg/dL (ref 0.4–1.2)
GFR: 66.6 mL/min (ref >60.00)
Glucose, Bld: 95 mg/dL (ref 70–99)
Potassium: 4.1 mEq/L (ref 3.5–5.1)
Sodium: 138 mEq/L (ref 135–145)

## 2010-05-11 LAB — HEPATIC FUNCTION PANEL
ALT: 49 U/L — ABNORMAL HIGH (ref 0–35)
AST: 50 U/L — ABNORMAL HIGH (ref 0–37)
Albumin: 3.7 g/dL (ref 3.5–5.2)
Alkaline Phosphatase: 110 U/L (ref 39–117)
Bilirubin, Direct: 0.1 mg/dL (ref 0.0–0.3)
Total Bilirubin: 1 mg/dL (ref 0.3–1.2)
Total Protein: 7 g/dL (ref 6.0–8.3)

## 2010-05-11 LAB — LIPID PANEL
Cholesterol: 174 mg/dL (ref 0–200)
HDL: 52.3 mg/dL (ref >39.00)
LDL Cholesterol: 106 mg/dL — ABNORMAL HIGH (ref 0–99)
Total CHOL/HDL Ratio: 3
Triglycerides: 78 mg/dL (ref 0.0–149.0)
VLDL: 15.6 mg/dL (ref 0.0–40.0)

## 2010-05-11 LAB — CBC WITH DIFFERENTIAL/PLATELET
Basophils Absolute: 0 10*3/uL (ref 0.0–0.1)
Basophils Relative: 0.4 % (ref 0.0–3.0)
Eosinophils Absolute: 0.1 10*3/uL (ref 0.0–0.7)
Eosinophils Relative: 2.5 % (ref 0.0–5.0)
HCT: 40.6 % (ref 36.0–46.0)
Hemoglobin: 13.6 g/dL (ref 12.0–15.0)
Lymphocytes Relative: 21.3 % (ref 12.0–46.0)
Lymphs Abs: 1 10*3/uL (ref 0.7–4.0)
MCHC: 33.5 g/dL (ref 30.0–36.0)
MCV: 95.9 fl (ref 78.0–100.0)
Monocytes Absolute: 0.4 10*3/uL (ref 0.1–1.0)
Monocytes Relative: 7.5 % (ref 3.0–12.0)
Neutro Abs: 3.2 10*3/uL (ref 1.4–7.7)
Neutrophils Relative %: 68.3 % (ref 43.0–77.0)
Platelets: 222 10*3/uL (ref 150.0–400.0)
RBC: 4.23 Mil/uL (ref 3.87–5.11)
RDW: 14.4 % (ref 11.5–14.6)
WBC: 4.7 10*3/uL (ref 4.5–10.5)

## 2010-05-11 LAB — URINALYSIS
Bilirubin Urine: NEGATIVE
Hemoglobin, Urine: NEGATIVE
Ketones, ur: NEGATIVE
Leukocytes, UA: NEGATIVE
Nitrite: NEGATIVE
Specific Gravity, Urine: <1.005 (ref 1.000–1.030)
Total Protein, Urine: NEGATIVE
Urine Glucose: NEGATIVE
Urobilinogen, UA: 0.2 (ref 0.0–1.0)
pH: 6.5 (ref 5.0–8.0)

## 2010-05-11 LAB — TSH: TSH: 1.68 u[IU]/mL (ref 0.35–5.50)

## 2010-05-13 ENCOUNTER — Encounter: Payer: Self-pay | Admitting: Internal Medicine

## 2010-05-13 ENCOUNTER — Ambulatory Visit
Admission: RE | Admit: 2010-05-13 | Discharge: 2010-05-13 | Payer: Self-pay | Source: Home / Self Care | Attending: Internal Medicine | Admitting: Internal Medicine

## 2010-05-13 DIAGNOSIS — R030 Elevated blood-pressure reading, without diagnosis of hypertension: Secondary | ICD-10-CM | POA: Insufficient documentation

## 2010-05-13 DIAGNOSIS — R7402 Elevation of levels of lactic acid dehydrogenase (LDH): Secondary | ICD-10-CM | POA: Insufficient documentation

## 2010-05-13 DIAGNOSIS — R740 Nonspecific elevation of levels of transaminase and lactic acid dehydrogenase [LDH]: Secondary | ICD-10-CM

## 2010-05-17 ENCOUNTER — Encounter: Payer: Self-pay | Admitting: Surgery

## 2010-05-18 ENCOUNTER — Ambulatory Visit
Admission: RE | Admit: 2010-05-18 | Discharge: 2010-05-18 | Payer: Self-pay | Source: Home / Self Care | Attending: Psychology | Admitting: Psychology

## 2010-05-20 ENCOUNTER — Encounter: Payer: Self-pay | Admitting: Internal Medicine

## 2010-05-24 LAB — CONVERTED CEMR LAB
Bacteria, UA: NEGATIVE
Eosinophils Absolute: 0.1 10*3/uL (ref 0.0–0.6)
Eosinophils Relative: 2.9 % (ref 0.0–5.0)
HCT: 38.3 % (ref 36.0–46.0)
Hemoglobin, Urine: NEGATIVE
MCV: 94.4 fL (ref 78.0–100.0)
Mucus, UA: NEGATIVE
Neutrophils Relative %: 62.8 % (ref 43.0–77.0)
RBC: 4.05 M/uL (ref 3.87–5.11)
Total Protein, Urine: NEGATIVE mg/dL
WBC: 5 10*3/uL (ref 4.5–10.5)

## 2010-05-25 ENCOUNTER — Ambulatory Visit: Admit: 2010-05-25 | Payer: Self-pay | Admitting: Psychology

## 2010-05-26 NOTE — Progress Notes (Signed)
Summary: ov needed  Phone Note Outgoing Call   Summary of Call: Per MD, schedule patient for appt prior to cataract surgery. Initial call taken by: Lucious Groves,  July 03, 2009 2:16 PM

## 2010-05-26 NOTE — Assessment & Plan Note (Signed)
Summary: 6 month rov/sl   Visit Type:  Follow-up Primary Provider:  Tresa Garter MD  CC:  fatigue.  History of Present Illness: Patient is a 77 yera old with a hisotyr of HTN, orthostatic intolerance, sinus node dysfunction (s/p pacemaker).  I saw her last in the fall. Since seen she has done well.  She is not dizzy.  Breathing is good.  No chest pain.  Current Medications (verified): 1)  Aciphex 20 Mg  Tbec (Rabeprazole Sodium) .[name].. 1 Once Daily 2)  Levoxyl 100 Mcg  Tabs (Levothyroxine Sodium) .... Take One Tab By Mouth Once Daily 3)  Lexapro 20 Mg Tabs (Escitalopram Oxalate) .[name].. 1 Tab Once Daily 4)  Alphagan P 0.1 %  Soln (Brimonidine Tartrate) .... Two Times A Day 5)  Multivitamins   Tabs (Multiple Vitamin) .... Once Daily 6)  Metamucil 30.9 %  Powd (Psyllium) 7)  Align  Caps (Misc Intestinal Flora Regulat) .... As Needed 8)  Acidophilus  Tabs (Lactobacillus) .... As Needed 9)  Allergy Shot .... Every Other Week 10)  Pindolol 5 Mg Tabs (Pindolol) .... Take 1 Tablet Every Day 11)  Vitamin D 1000 Unit Tabs (Cholecalciferol) .... Once Daily 12)  Hydrochlorothiazide 12.5 Mg Caps (Hydrochlorothiazide) .[name].. 1 Once Daily  Allergies (verified): 1)  ! Diovan 2)  ! Norvasc 3)  ! Verapamil 4)  ! * Lotrel 5)  Clonidine Hcl  Past History:  Past Surgical History: Last updated: 01/13/2009 Mastectomy B Pacemaker 2009 L foot surgery  Family History: Last updated: 03/22/2007 Family History Hypertension  Social History: Last updated: 04/22/2009 Retired She is a vegan now 2010 Single Never Smoked Alcohol Use - no  Past Medical History: Breast cancer, hx of GERD Hypertension Hypothyroidism Osteoarthritis Osteopenia Bradycardia Gyn Dr Ashley Royalty GI DR Medoff Psych Dr Lolly Mustache Anxiety IBS Brady/pacemaker Orthostatic intolerance Chronotropic incompetence with normal pacemaker function Orthostatic intolerance  Vital Signs:  Patient profile:   77 year old  female Height:      62 inches Weight:      176 pounds BMI:     32.31 Pulse rate:   85 / minute BP sitting:   127 / 88  (left arm) Cuff size:   regular  Vitals Entered By: Burnett Kanaris, CNA (October 17, 2009 10:40 AM)  Physical Exam  Additional Exam:  Patient is in NAD HEENT:  Normocephalic, atraumatic. EOMI, PERRLA.  Neck: JVP is normal. No thyromegaly. No bruits.  Lungs: clear to auscultation. No rales no wheezes.  Chest:  s/p mastectomy Heart: Regular rate and rhythm. Normal S1, S2. No S3.   No significant murmurs. PMI not displaced.  Abdomen:  Supple, nontender. Normal bowel sounds. No masses. No hepatomegaly.  Extremities:   Good distal pulses throughout. No lower extremity edema.  Musculoskeletal :moving all extremities.  Neuro:   alert and oriented x3.    EKG  Procedure date:  10/17/2009  Findings:      AV sequential pacing.  85 bpm.  PPM Specifications Following MD:  Sherryl Manges, MD     PPM Vendor:  [employer]     PPM Model Number:  [id number]     PPM Serial Number:  [id number] PPM DOI:  09/20/2007     PPM Implanting MD:  Sherryl Manges, MD  Lead 1    Location: [facility]     DOI: 09/20/2007     Model #: 1478     Serial #: [id number]     Status: active Lead 2    Location: [facility]  DOI: 09/20/2007     Model #: [id number]     Serial #: [id number]     Status: active  Magnet Response Rate:  BOL 85 ERI 65  Indications:  Chronotropic Incompetence   PPM Follow Up Pacer Dependent:  No      Episodes Coumadin:  No  Parameters Mode:  DDDR     Lower Rate Limit:  70     Upper Rate Limit:  120 Paced AV Delay:  260     Sensed AV Delay:  230  Impression & Recommendations:  Problem # 1:  HYPOTENSION, ORTHOSTATIC (ICD-458.0) Doing well.  COntinue current regimen.  Problem # 2:  PACEMAKER DDD- MDT (ICD-V45.01) F/U by EP  Problem # 3:  HYPERTENSION (ICD-401.9) COntinue meds. Her updated medication list for this problem includes:    Pindolol 5 Mg Tabs (Pindolol) .[name]... Take 1 tablet every  day    Hydrochlorothiazide 12.5 Mg Caps (Hydrochlorothiazide) .[name]... 1 once daily  Other Orders: EKG w/ Interpretation (93000)

## 2010-05-26 NOTE — Cardiovascular Report (Signed)
Summary: Office Visit Remote   Office Visit Remote   Imported By: Roderic Ovens 12/17/2009 14:56:20  _____________________________________________________________________  External Attachment:    Type:   Image     Comment:   External Document

## 2010-05-26 NOTE — Assessment & Plan Note (Signed)
Summary: pc2 medtronic   Primary Provider:  Tresa Garter MD  CC:  device check..  History of Present Illness:  Mr s. [name] is seen in followup for orthostatic intolerance, exercise intolerance, and prior pacemaker implantation for sinus node dysfunction.  She is . doing better with regard to dizziness.  She remains quite depressed. She has chronic shortness of breath and weakness which is stable. There has been no chest pain or edema.     Current Medications (verified): 1)  Aciphex 20 Mg  Tbec (Rabeprazole Sodium) .[name].. 1 Once Daily 2)  Levoxyl 100 Mcg  Tabs (Levothyroxine Sodium) .... Take One Tab By Mouth Once Daily 3)  Lexapro 20 Mg Tabs (Escitalopram Oxalate) .[name].. 1 Tab Once Daily 4)  Alphagan P 0.1 %  Soln (Brimonidine Tartrate) .... Two Times A Day 5)  Multivitamins   Tabs (Multiple Vitamin) .... Once Daily 6)  Metamucil 30.9 %  Powd (Psyllium) 7)  Align  Caps (Misc Intestinal Flora Regulat) .... As Needed 8)  Acidophilus  Tabs (Lactobacillus) .... As Needed 9)  Allergy Shot .... Every Other Week 10)  Pindolol 5 Mg Tabs (Pindolol) .... Take 1 Tablet Every Day 11)  Vitamin D 1000 Unit Tabs (Cholecalciferol) .... Once Daily  Allergies (verified): 1)  ! Diovan 2)  ! Norvasc 3)  ! Verapamil 4)  ! * Lotrel 5)  Clonidine Hcl  Past History:  Past Medical History: Last updated: 06/19/2008 Breast cancer, hx of GERD Hypertension Hypothyroidism Osteoarthritis Osteopenia Bradycardia Gyn Dr Ashley Royalty GI DR Medoff Psych Dr Lolly Mustache Anxiety IBS Brady/pacemaker Chronotropic incompetence with normal pacemaker function Orthostatic intolerance  Past Surgical History: Last updated: 01/13/2009 Mastectomy B Pacemaker 2009 L foot surgery  Family History: Last updated: 03/22/2007 Family History Hypertension  Social History: Last updated: 04/22/2009 Retired She is a vegan now 2010 Single Never Smoked Alcohol Use - no  Vital Signs:  Patient profile:   77  year old female Height:      62 inches Weight:      183 pounds BMI:     33.59 Pulse rate:   80 / minute Pulse rhythm:   regular BP sitting:   136 / 80  (left arm) Cuff size:   regular  Vitals Entered By: Judithe Modest CMA (Aug 26, 2009 9:56 AM)  Physical Exam  General:  The patient was alert and oriented in no acute distress. HEENT Normal.  Neck veins were flat, carotids were brisk.  Lungs were clear.  Heart sounds were regular without murmurs or gallops.  Abdomen was soft with active bowel sounds. There is no clubbing cyanosis or edema. Skin Warm and dry    PPM Specifications Following MD:  Sherryl Manges, MD     PPM Vendor:  [employer]     PPM Model Number:  [id number]     PPM Serial Number:  [id number] H PPM DOI:  09/20/2007     PPM Implanting MD:  Sherryl Manges, MD  Lead 1    Location: [facility]     DOI: 09/20/2007     Model #: 0865     Serial #: [id number]     Status: active Lead 2    Location: [facility]     DOI: 09/20/2007     Model #: 8413     Serial #: [id number]     Status: active  Magnet Response Rate:  BOL 85 ERI 65  Indications:  Chronotropic Incompetence   PPM Follow Up Remote Check?  No Battery Voltage:  2.79  V     Battery Est. Longevity:  10.96YRS     Pacer Dependent:  No       PPM Device Measurements Atrium  Amplitude: 5.60 mV, Impedance: 487 ohms, Threshold: 0.750 V at 0.40 msec Right Ventricle  Amplitude: 11.20 mV, Impedance: 518 ohms, Threshold: 0.50 V at 0.40 msec  Episodes MS Episodes:  1     Percent Mode Switch:  <0.1%     Coumadin:  No Ventricular High Rate:  1     Atrial Pacing:  89%     Ventricular Pacing:  0.5%  Parameters Mode:  DDDR     Lower Rate Limit:  70     Upper Rate Limit:  120 Paced AV Delay:  260     Sensed AV Delay:  230 Next Remote Date:  11/27/2009     Tech Comments:  1 MODE SWITCH LASTING 52 MINUTES 26 SECONDS.  1 VHR EPISODE LASTING 3 SECONDS--ATRIAL TACH.  NORMAL DEVICE FUNCTION. NO CHANGES MADE.   CARELINK CHECK 11-27-09.  Impression &  Recommendations:  Problem # 1:  ATRIAL FIBRILLATION (ICD-427.31)  The patient has newly identified atrial fibrillation. This episode lasted about one hour. The durationto prompt therapywith oral anticoagulation is not clear but i think it isolated episode at one hours probably not sufficient.Maryclare Labrador keep an eye on this. Her updated medication list for this problem includes:    Pindolol 5 Mg Tabs (Pindolol) .[name]... Take 1 tablet every day  Her updated medication list for this problem includes:    Pindolol 5 Mg Tabs (Pindolol) .[name]... Take 1 tablet every day  Problem # 2:  SINOATRIAL NODE DYSFUNCTION (ICD-427.81)  stable post pacemaker implantation Her updated medication list for this problem includes:    Pindolol 5 Mg Tabs (Pindolol) .[name]... Take 1 tablet every day  Her updated medication list for this problem includes:    Pindolol 5 Mg Tabs (Pindolol) .[name]... Take 1 tablet every day  Problem # 3:  PACEMAKER DDD- MDT (ICD-V45.01) Device parameters and data were reviewed and no changes were made  Problem # 4:  HYPOTENSION, ORTHOSTATIC (ICD-458.0) continues to be a problem; we again reviewed efforts to mitigate this  Problem # 5:  DYSPNEA (ICD-786.09) stable The following medications were removed from the medication list:    Hydrochlorothiazide 12.5 Mg Tabs (Hydrochlorothiazide) .[name]... Take 1 tab  by mouth every morning Her updated medication list for this problem includes:    Pindolol 5 Mg Tabs (Pindolol) .[name]... Take 1 tablet every day  Problem # 6:  DEPRESSION (ICD-311) I encouraged her to be increasingly involved. She is to submit application for senior citizens. She will continue her on her and become more involved in her church

## 2010-05-26 NOTE — Letter (Signed)
Summary: Medical Clearance/[facility] Clearance/[facility]   Imported By: Sherian Rein 07/07/2009 09:38:47  _____________________________________________________________________  External Attachment:    Type:   Image     Comment:   External Document

## 2010-05-26 NOTE — Assessment & Plan Note (Signed)
Summary: rov. gd   Visit Type:  6 month follow up Primary Tobin Cadiente:  Tresa Garter MD  CC:  no complaints.  History of Present Illness:  [name] is seen in followup for orthostatic intolerance, exercise intolerance, and prior pacemaker implantation for sinus node dysfunction.  She is . doing better with regard to dizziness.  She his last depressed. She has chronic shortness of breath and weakness which is stable. There has been no chest pain or edema.   Current Medications (verified): 1)  Aciphex 20 Mg  Tbec (Rabeprazole Sodium) .[name].. 1 Once Daily 2)  Levoxyl 100 Mcg  Tabs (Levothyroxine Sodium) .... Take One Tab By Mouth Once Daily 3)  Lexapro 20 Mg Tabs (Escitalopram Oxalate) .[name].. 1 Tab Once Daily 4)  Alphagan P 0.1 %  Soln (Brimonidine Tartrate) .... Two Times A Day 5)  Multivitamins   Tabs (Multiple Vitamin) .... Once Daily 6)  Metamucil 30.9 %  Powd (Psyllium) 7)  Align  Caps (Misc Intestinal Flora Regulat) .... As Needed 8)  Allergy Shot .... Every Other Week 9)  Pindolol 5 Mg Tabs (Pindolol) .... Take 1 Tablet Every Day 10)  Vitamin D 1000 Unit Tabs (Cholecalciferol) .... Once Daily 11)  Nitro-Dur 0.2 Mg/hr Pt24 (Nitroglycerin) .... As Directed 12)  Lorazepam 0.5 Mg Tabs (Lorazepam) .... As Needed 13)  Enablex 7.5 Mg Xr24h-Tab (Darifenacin Hydrobromide) .[name].. 1 Once Daily  Allergies (verified): 1)  ! Diovan 2)  ! Norvasc 3)  ! Verapamil 4)  ! * Lotrel 5)  Clonidine Hcl  Past History:  Past Medical History: Last updated: 02/03/2010 Breast cancer, hx of GERD Hypertension Hypothyroidism Osteoarthritis Osteopenia Bradycardia Gyn Dr Ashley Royalty GI DR Medoff Psych Dr Lolly Mustache Anxiety IBS Brady/pacemaker Orthostatic intolerance Chronotropic incompetence with normal pacemaker function Orthostatic intolerance Discoid lupus - skin  Past Surgical History: Last updated: 01/13/2009 Mastectomy B Pacemaker 2009 L foot surgery  Family History: Last updated:  03/22/2007 Family History Hypertension  Social History: Last updated: 04/22/2009 Retired She is a vegan now 2010 Single Never Smoked Alcohol Use - no  Risk Factors: Smoking Status: never (07/15/2009)  Vital Signs:  Patient profile:   77 year old female Height:      62 inches Weight:      175 pounds BMI:     32.12 Pulse rate:   86 / minute BP sitting:   136 / 93  (right arm) Cuff size:   regular  Vitals Entered By: Caralee Ates CMA (March 05, 2010 11:35 AM)  Physical Exam  General:  The patient was alert and oriented in no acute distress. HEENT Normal.  Neck veins were flat, carotids were brisk.  Lungs were clear.  Heart sounds were regular without murmurs or gallops.  Abdomen was soft with active bowel sounds. There is no clubbing cyanosis or edema. Skin Warm and dry    PPM Specifications Following MD:  Sherryl Manges, MD     PPM Vendor:  [employer]     PPM Model Number:  [id number]     PPM Serial Number:  [id number] PPM DOI:  09/20/2007     PPM Implanting MD:  Sherryl Manges, MD  Lead 1    Location: [facility]     DOI: 09/20/2007     Model #: 9147     Serial #: [id number]     Status: active Lead 2    Location: [facility]     DOI: 09/20/2007     Model #: 6578     Serial #:  [id number]     Status: active  Magnet Response Rate:  BOL 85 ERI 65  Indications:  Chronotropic Incompetence   PPM Follow Up Pacer Dependent:  No      Episodes Coumadin:  No  Parameters Mode:  DDDR     Lower Rate Limit:  70     Upper Rate Limit:  120 Paced AV Delay:  260     Sensed AV Delay:  230  Impression & Recommendations:  Problem # 1:  VENTRICULAR TACHYCARDIA (ICD-427.1) patient has nonsustained ventricular tachycardia. In the setting of normal left ventricular function the prognostic implications are minimal. Her updated medication list for this problem includes:    Pindolol 5 Mg Tabs (Pindolol) .[name]... Take 1 tablet every day    Nitro-dur 0.2 Mg/hr Pt24 (Nitroglycerin) .[name]... As directed  Problem #  2:  ATRIAL FIBRILLATION (ICD-427.31) no intercurrent atrial fibrillation Her updated medication list for this problem includes:    Pindolol 5 Mg Tabs (Pindolol) .[name]... Take 1 tablet every day  Problem # 3:  SINOATRIAL NODE DYSFUNCTION (ICD-427.81) table with atrial pacing Her updated medication list for this problem includes:    Pindolol 5 Mg Tabs (Pindolol) .[name]... Take 1 tablet every day    Nitro-dur 0.2 Mg/hr Pt24 (Nitroglycerin) .[name]... As directed  Problem # 4:  PACEMAKER DDD- MDT (ICD-V45.01) Device parameters and data were reviewed and no changes were made

## 2010-05-26 NOTE — Assessment & Plan Note (Signed)
Summary: 3 MO ROV /NWS $50   Vital Signs:  Patient profile:   77 year old female Weight:      184 pounds Temp:     98.1 degrees F oral Pulse rate:   71 / minute BP sitting:   106 / 76  (left arm)  Vitals Entered By: Tora Perches (January 13, 2009 1:40 PM) CC: f/u Is Patient Diabetic? No   Primary Care Provider:  Tresa Garter MD  CC:  f/u.  History of Present Illness: The patient presents for a follow up of IBS, back pain, anxiety, depression and headaches.   Current Medications (verified): 1)  Aciphex 20 Mg  Tbec (Rabeprazole Sodium) .[name].. 1 Once Daily 2)  Levoxyl 100 Mcg  Tabs (Levothyroxine Sodium) .... Take One Tab By Mouth Once Daily 3)  Lexapro 10 Mg Tabs (Escitalopram Oxalate) .... Once Daily 4)  Detrol La 4 Mg Xr24h-Cap (Tolterodine Tartrate) .... Once Daily 5)  Alphagan P 0.1 %  Soln (Brimonidine Tartrate) .... Two Times A Day 6)  Calcium Carbonate-Vitamin D 600-400 Mg-Unit  Tabs (Calcium Carbonate-Vitamin D) .[name].. 1 Tab Once Daily 7)  Multivitamins   Tabs (Multiple Vitamin) .... Once Daily 8)  Metamucil 30.9 %  Powd (Psyllium) 9)  Align  Caps (Misc Intestinal Flora Regulat) .... As Needed 10)  Acidophilus  Tabs (Lactobacillus) .... As Needed 11)  Hydrochlorothiazide 12.5 Mg  Tabs (Hydrochlorothiazide) .... Take 1 Tab  By Mouth Every Morning As Needed Swelling 12)  Allergy Shot .... Every Other Week 13)  Pindolol 5 Mg Tabs (Pindolol) .... Take One Half Tablet By Mouth Twice A Day  Allergies: 1)  ! Diovan 2)  ! Norvasc 3)  ! Verapamil 4)  ! * Lotrel 5)  Clonidine Hcl  Past History:  Past Medical History: Last updated: 06/19/2008 Breast cancer, hx of GERD Hypertension Hypothyroidism Osteoarthritis Osteopenia Bradycardia Gyn Dr Ashley Royalty GI DR Medoff Psych Dr Lolly Mustache Anxiety IBS Brady/pacemaker Chronotropic incompetence with normal pacemaker function Orthostatic intolerance  Past Surgical History: Mastectomy B Pacemaker 2009 L foot  surgery  Social History: Reviewed history from 06/19/2008 and no changes required. Retired Single Never Smoked Alcohol Use - no  Review of Systems       The patient complains of difficulty walking and depression.  The patient denies abdominal pain and melena.    Physical Exam  General:  Well developed, well nourished, in no acute distress. Nose:  External nasal examination shows no deformity or inflammation. Nasal mucosa are pink and moist without lesions or exudates. Mouth:  tihrush present Neck:  JVP normal Lungs:  clear to auscultation. No rales, or wheeze Heart:  regular rate, rhythm, S1, S2. No murmur Abdomen:  supple. Normal bowel sounds. No masses Msk:  R troch bursa area is less tender Lumbar-sacral spine is tender to palpation over paraspinal muscles and painfull with the ROM  Neurologic:  Alert and oriented x 3. Skin:  Intact without suspicious lesions or rashes Psych:  depressed affect.  Not tearful   Impression & Recommendations:  Problem # 1:  HYPOTENSION, ORTHOSTATIC (ICD-458.0) Assessment Improved  Problem # 2:  HIP PAIN, RIGHT (ICD-719.45) Assessment: Improved  Problem # 3:  OSTEOARTHRITIS (ICD-715.90)  Problem # 4:  HYPOTHYROIDISM (ICD-244.9) Assessment: Comment Only  Her updated medication list for this problem includes:    Levoxyl 100 Mcg Tabs (Levothyroxine sodium) .[name]... Take one tab by mouth once daily  Problem # 5:  HYPERGLYCEMIA (ICD-790.29) Assessment: Unchanged Keep exercising  Problem # 6:  DYSPNEA (ICD-786.09) Assessment: Improved  Her updated medication list for this problem includes:    Hydrochlorothiazide 12.5 Mg Tabs (Hydrochlorothiazide) .[name]... Take 1 tab  by mouth every morning as needed swelling    Pindolol 5 Mg Tabs (Pindolol) .[name]... Take one half tablet by mouth twice a day  Problem # 7:  VITAMIN D DEFICIENCY (ICD-268.9) Assessment: Improved On prescription therapy   Complete Medication List: 1)  Aciphex 20 Mg Tbec  (Rabeprazole sodium) .[name].. 1 once daily 2)  Levoxyl 100 Mcg Tabs (Levothyroxine sodium) .... Take one tab by mouth once daily 3)  Lexapro 10 Mg Tabs (Escitalopram oxalate) .... Once daily 4)  Detrol La 4 Mg Xr24h-cap (Tolterodine tartrate) .... Once daily 5)  Alphagan P 0.1 % Soln (Brimonidine tartrate) .... Two times a day 6)  Calcium Carbonate-vitamin D 600-400 Mg-unit Tabs (Calcium carbonate-vitamin d) .[name].. 1 tab once daily 7)  Multivitamins Tabs (Multiple vitamin) .... Once daily 8)  Metamucil 30.9 % Powd (Psyllium) 9)  Align Caps (Misc intestinal flora regulat) .... As needed 10)  Acidophilus Tabs (Lactobacillus) .... As needed 11)  Hydrochlorothiazide 12.5 Mg Tabs (Hydrochlorothiazide) .... Take 1 tab  by mouth every morning as needed swelling 12)  Allergy Shot  .... Every other week 13)  Pindolol 5 Mg Tabs (Pindolol) .... Take one half tablet by mouth twice a day  Patient Instructions: 1)  Please schedule a follow-up appointment in 3 months. 2)  BMP prior to visit, ICD-9: 3)  Hepatic Panel prior to visit, ICD-9: 4)  Lipid Panel prior to visit, ICD-9:401.1 995.20 5)  HbgA1C prior to visit, ICD-9:790.29

## 2010-05-26 NOTE — Assessment & Plan Note (Signed)
Summary: 3 MO ROV /NWS   Vital Signs:  Patient profile:   77 year old female Weight:      176 pounds BMI:     32.31 Temp:     98.2 degrees F oral Pulse rate:   73 / minute BP sitting:   140 / 90  (right arm) Cuff size:   regular  Vitals Entered By: Lamar Sprinkles, CMA (October 14, 2009 8:05 AM) CC: f/u  Comments Pt states she takes HCTZ 12.5 as needed swelling, added to med list.    Primary Care Provider:  Tresa Garter MD  CC:  f/u .  History of Present Illness: The patient presents for a follow up of GERD, IBS,  back pain, anxiety, depression.   Current Medications (verified): 1)  Aciphex 20 Mg  Tbec (Rabeprazole Sodium) .[name].. 1 Once Daily 2)  Levoxyl 100 Mcg  Tabs (Levothyroxine Sodium) .... Take One Tab By Mouth Once Daily 3)  Lexapro 20 Mg Tabs (Escitalopram Oxalate) .[name].. 1 Tab Once Daily 4)  Alphagan P 0.1 %  Soln (Brimonidine Tartrate) .... Two Times A Day 5)  Multivitamins   Tabs (Multiple Vitamin) .... Once Daily 6)  Metamucil 30.9 %  Powd (Psyllium) 7)  Align  Caps (Misc Intestinal Flora Regulat) .... As Needed 8)  Acidophilus  Tabs (Lactobacillus) .... As Needed 9)  Allergy Shot .... Every Other Week 10)  Pindolol 5 Mg Tabs (Pindolol) .... Take 1 Tablet Every Day 11)  Vitamin D 1000 Unit Tabs (Cholecalciferol) .... Once Daily 12)  Hydrochlorothiazide 12.5 Mg Caps (Hydrochlorothiazide) .[name].. 1 Once Daily  Allergies (verified): 1)  ! Diovan 2)  ! Norvasc 3)  ! Verapamil 4)  ! * Lotrel 5)  Clonidine Hcl  Past History:  Past Medical History: Last updated: 06/19/2008 Breast cancer, hx of GERD Hypertension Hypothyroidism Osteoarthritis Osteopenia Bradycardia Gyn Dr Ashley Royalty GI DR Medoff Psych Dr Lolly Mustache Anxiety IBS Brady/pacemaker Chronotropic incompetence with normal pacemaker function Orthostatic intolerance  Social History: Last updated: 04/22/2009 Retired She is a vegan now 2010 Single Never Smoked Alcohol Use - no  Review of  Systems  The patient denies fever, dyspnea on exertion, and abdominal pain.    Physical Exam  General:  Well developed, well nourished, in no acute distress. Head:  Normocephalic and atraumatic without obvious abnormalities. No apparent alopecia or balding. Nose:  External nasal examination shows no deformity or inflammation. Nasal mucosa are pink and moist without lesions or exudates. Mouth:  No thrush present Neck:  JVP normal Lungs:  clear to auscultation. No rales, or wheeze Heart:  regular rate, rhythm, S1, S2. No murmur Abdomen:  supple. Normal bowel sounds. No masses Msk:  R troch bursa area is less tender Lumbar-sacral spine is tender to palpation over paraspinal muscles and painfull with the ROM  Neurologic:  No cranial nerve deficits noted. Station and gait are normal. Plantar reflexes are down-going bilaterally. DTRs are symmetrical throughout. Sensory, motor and coordinative functions appear intact. Skin:  Intact without suspicious lesions or rashes Psych:  depressed affect.  Not tearful   Impression & Recommendations:  Problem # 1:  FATIGUE (ICD-780.79) Assessment Improved  Orders: TLB-A1C / Hgb A1C (Glycohemoglobin) (83036-A1C) TLB-BMP (Basic Metabolic Panel-BMET) (80048-METABOL) TLB-Hepatic/Liver Function Pnl (80076-HEPATIC) TLB-CBC Platelet - w/Differential (85025-CBCD) TLB-TSH (Thyroid Stimulating Hormone) (84443-TSH) TLB-Udip ONLY (81003-UDIP)  Problem # 2:  HYPOTHYROIDISM (ICD-244.9) Assessment: Unchanged  Her updated medication list for this problem includes:    Levoxyl 100 Mcg Tabs (Levothyroxine sodium) .[name]... Take one tab  by mouth once daily  Problem # 3:  HYPERTENSION (ICD-401.9) Assessment: Comment Only  Her updated medication list for this problem includes:    Pindolol 5 Mg Tabs (Pindolol) .[name]... Take 1 tablet every day    Hydrochlorothiazide 12.5 Mg Caps (Hydrochlorothiazide) .[name]... 1 once daily  Problem # 4:  ALLERGIC RHINITIS  (ICD-477.9) Assessment: Improved  Discussed use of allergy medications and environmental measures.   Problem # 5:  GERD (ICD-530.81) Assessment: Comment Only  Her updated medication list for this problem includes:    Aciphex 20 Mg Tbec (Rabeprazole sodium) .[name]... 1 once daily  Complete Medication List: 1)  Aciphex 20 Mg Tbec (Rabeprazole sodium) .[name].. 1 once daily 2)  Levoxyl 100 Mcg Tabs (Levothyroxine sodium) .... Take one tab by mouth once daily 3)  Lexapro 20 Mg Tabs (Escitalopram oxalate) .[name].. 1 tab once daily 4)  Alphagan P 0.1 % Soln (Brimonidine tartrate) .... Two times a day 5)  Multivitamins Tabs (Multiple vitamin) .... Once daily 6)  Metamucil 30.9 % Powd (Psyllium) 7)  Align Caps (Misc intestinal flora regulat) .... As needed 8)  Acidophilus Tabs (Lactobacillus) .... As needed 9)  Allergy Shot  .... Every other week 10)  Pindolol 5 Mg Tabs (Pindolol) .... Take 1 tablet every day 11)  Vitamin D 1000 Unit Tabs (Cholecalciferol) .... Once daily 12)  Hydrochlorothiazide 12.5 Mg Caps (Hydrochlorothiazide) .[name].. 1 once daily  Other Orders: TLB-Lipid Panel (80061-LIPID)  Patient Instructions: 1)  Please schedule a follow-up appointment in 4 months. 2)  BMP prior to visit, ICD-9: 3)  HbgA1C prior to visit, ICD-9:790.29 4)  Agavae syrup

## 2010-05-26 NOTE — Medication Information (Signed)
Summary: Aciphex/[employer]  Aciphex/[employer]   Imported By: Sherian Rein 07/29/2009 10:51:28  _____________________________________________________________________  External Attachment:    Type:   Image     Comment:   External Document  Appended Document: Aciphex/[employer] Signed doc.

## 2010-05-26 NOTE — Cardiovascular Report (Signed)
Summary: Office Visit   Office Visit   Imported By: Roderic Ovens 08/26/2009 16:41:41  _____________________________________________________________________  External Attachment:    Type:   Image     Comment:   External Document

## 2010-05-26 NOTE — Cardiovascular Report (Signed)
Summary: Office Visit Remote   Office Visit Remote   Imported By: Roderic Ovens 07/02/2009 11:07:50  _____________________________________________________________________  External Attachment:    Type:   Image     Comment:   External Document

## 2010-05-26 NOTE — Miscellaneous (Signed)
Summary: [facility]   Imported By: Lester Parrott 07/25/2009 08:20:59  _____________________________________________________________________  External Attachment:    Type:   Image     Comment:   External Document

## 2010-05-26 NOTE — Assessment & Plan Note (Signed)
Summary: 4 mos f/u #/cd   Vital Signs:  Patient profile:   77 year old female Height:      62 inches Weight:      176 pounds BMI:     32.31 Temp:     98.5 degrees F oral Pulse rate:   80 / minute Pulse rhythm:   regular Resp:     16 per minute BP sitting:   130 / 88  (right arm) Cuff size:   regular  Vitals Entered By: Lanier Prude, CMA(AAMA) (February 03, 2010 9:13 AM) CC: 4 mo f/u Is Patient Diabetic? No Comments pt is not taking Align, Acidophilus or HCTZ.   Primary Care Layaan Mott:  Tresa Garter MD  CC:  4 mo f/u.  History of Present Illness: The patient presents for a follow up of hypertension, hyperlipidemia, IBS, OA  C/o anal fissure C/o skin lupus  Current Medications (verified): 1)  Aciphex 20 Mg  Tbec (Rabeprazole Sodium) .[name].. 1 Once Daily 2)  Levoxyl 100 Mcg  Tabs (Levothyroxine Sodium) .... Take One Tab By Mouth Once Daily 3)  Lexapro 20 Mg Tabs (Escitalopram Oxalate) .[name].. 1 Tab Once Daily 4)  Alphagan P 0.1 %  Soln (Brimonidine Tartrate) .... Two Times A Day 5)  Multivitamins   Tabs (Multiple Vitamin) .... Once Daily 6)  Metamucil 30.9 %  Powd (Psyllium) 7)  Align  Caps (Misc Intestinal Flora Regulat) .... As Needed 8)  Acidophilus  Tabs (Lactobacillus) .... As Needed 9)  Allergy Shot .... Every Other Week 10)  Pindolol 5 Mg Tabs (Pindolol) .... Take 1 Tablet Every Day 11)  Vitamin D 1000 Unit Tabs (Cholecalciferol) .... Once Daily 12)  Hydrochlorothiazide 12.5 Mg Caps (Hydrochlorothiazide) .[name].. 1 Once Daily 13)  Nitro-Dur 0.2 Mg/hr Pt24 (Nitroglycerin) .... As Directed 14)  Lorazepam 0.5 Mg Tabs (Lorazepam) .... As Needed 15)  Enablex 7.5 Mg Xr24h-Tab (Darifenacin Hydrobromide) .[name].. 1 Once Daily  Allergies (verified): 1)  ! Diovan 2)  ! Norvasc 3)  ! Verapamil 4)  ! * Lotrel 5)  Clonidine Hcl  Past History:  Family History: Last updated: 03/22/2007 Family History Hypertension  Past Medical History: Breast cancer, hx  of GERD Hypertension Hypothyroidism Osteoarthritis Osteopenia Bradycardia Gyn Dr Ashley Royalty GI DR Medoff Psych Dr Lolly Mustache Anxiety IBS Brady/pacemaker Orthostatic intolerance Chronotropic incompetence with normal pacemaker function Orthostatic intolerance Discoid lupus - skin  Past Surgical History: Reviewed history from 01/13/2009 and no changes required. Mastectomy B Pacemaker 2009 L foot surgery  Review of Systems  The patient denies weight loss, weight gain, chest pain, dyspnea on exertion, and abdominal pain.    Physical Exam  General:  Well developed, well nourished, in no acute distress. Nose:  External nasal examination shows no deformity or inflammation. Nasal mucosa are pink and moist without lesions or exudates. Mouth:  No thrush present Neck:  JVP normal Lungs:  clear to auscultation. No rales, or wheeze Heart:  regular rate, rhythm, S1, S2. No murmur Abdomen:  supple. Normal bowel sounds. No masses Msk:  R troch bursa area is less tender Lumbar-sacral spine is tender to palpation over paraspinal muscles and painfull with the ROM  Neurologic:  No cranial nerve deficits noted. Station and gait are normal. Plantar reflexes are down-going bilaterally. DTRs are symmetrical throughout. Sensory, motor and coordinative functions appear intact. Skin:  Intact without suspicious lesions or rashes Psych:  depressed affect.  Not tearful   Impression & Recommendations:  Problem # 1:  ATRIAL FIBRILLATION (ICD-427.31) Assessment Unchanged  Her updated medication list for this problem includes:    Pindolol 5 Mg Tabs (Pindolol) .[name]... Take 1 tablet every day  Problem # 2:  ANXIETY (ICD-300.00) Assessment: Unchanged  Her updated medication list for this problem includes:    Lexapro 20 Mg Tabs (Escitalopram oxalate) .[name]... 1 tab once daily    Lorazepam 0.5 Mg Tabs (Lorazepam) .[name]... As needed  Problem # 3:  OSTEOARTHRITIS (ICD-715.90) Assessment: Unchanged On the  regimen of medicine(s) reflected in the chart    Problem # 4:  HYPOTHYROIDISM (ICD-244.9) Assessment: Unchanged  Her updated medication list for this problem includes:    Levoxyl 100 Mcg Tabs (Levothyroxine sodium) .[name]... Take one tab by mouth once daily  Problem # 5:  DEPRESSION (ICD-311) Assessment: Unchanged  Her updated medication list for this problem includes:    Lexapro 20 Mg Tabs (Escitalopram oxalate) .[name]... 1 tab once daily    Lorazepam 0.5 Mg Tabs (Lorazepam) .[name]... As needed  Problem # 6:  ANAL FISSURE (ICD-565.0) Assessment: Deteriorated Treated by Dr Kinnie Scales Anusol supp given. F/u w/GI is pending   Complete Medication List: 1)  Aciphex 20 Mg Tbec (Rabeprazole sodium) .[name].. 1 once daily 2)  Levoxyl 100 Mcg Tabs (Levothyroxine sodium) .... Take one tab by mouth once daily 3)  Lexapro 20 Mg Tabs (Escitalopram oxalate) .[name].. 1 tab once daily 4)  Alphagan P 0.1 % Soln (Brimonidine tartrate) .... Two times a day 5)  Multivitamins Tabs (Multiple vitamin) .... Once daily 6)  Metamucil 30.9 % Powd (Psyllium) 7)  Align Caps (Misc intestinal flora regulat) .... As needed 8)  Acidophilus Tabs (Lactobacillus) .... As needed 9)  Allergy Shot  .... Every other week 10)  Pindolol 5 Mg Tabs (Pindolol) .... Take 1 tablet every day 11)  Vitamin D 1000 Unit Tabs (Cholecalciferol) .... Once daily 12)  Hydrochlorothiazide 12.5 Mg Caps (Hydrochlorothiazide) .[name].. 1 once daily 13)  Nitro-dur 0.2 Mg/hr Pt24 (Nitroglycerin) .... As directed 14)  Lorazepam 0.5 Mg Tabs (Lorazepam) .... As needed 15)  Enablex 7.5 Mg Xr24h-tab (Darifenacin hydrobromide) .[name].. 1 once daily 16)  Anusol-hc 25 Mg Supp (Hydrocortisone acetate) .[name].. 1 pr two times a day for hemorrhoids  Patient Instructions: 1)  Please schedule a follow-up appointment in 3 months.well w/labs Prescriptions: ANUSOL-HC 25 MG SUPP (HYDROCORTISONE ACETATE) 1 pr two times a day for hemorrhoids  #20 x 3   Entered and Authorized by:   Tresa Garter MD   Signed by:   Tresa Garter MD on 02/03/2010   Method used:   Print then Give to Patient   RxID:   [id number]    Not Administered:    Influenza Vaccine not given due to: declined   Contraindications/Deferment of Procedures/Staging:    Test/Procedure: FLU VAX    Reason for deferment: patient declined     Test/Procedure: Pneumovax vaccine    Reason for deferment: patient declined     Test/Procedure: TD vaccine    Reason for deferment: declined

## 2010-05-26 NOTE — Letter (Signed)
Summary: [facility]  [facility]   Imported By: Lennie Odor 07/24/2009 14:20:20  _____________________________________________________________________  External Attachment:    Type:   Image     Comment:   External Document

## 2010-05-26 NOTE — Assessment & Plan Note (Signed)
Summary: OV PRIOR TO CATARACT SURGERY/KB   Vital Signs:  Patient profile:   77 year old female Weight:      180 pounds Temp:     98.2 degrees F oral Pulse rate:   82 / minute BP sitting:   132 / 80  (left arm)  Vitals Entered By: Tora Perches (July 15, 2009 10:03 AM) CC: f/u Is Patient Diabetic? No   Primary Care Provider:  Tresa Garter MD  CC:  f/u.  History of Present Illness: IM consult Req. by Dr Harlon Flor at [facility] Reason: med clearance for cataract surgery The patient presents for a follow up of hypertension, depression, hypothyroidism - stable lately. C/o leg cramps   Preventive Screening-Counseling & Management  Alcohol-Tobacco     Smoking Status: never  Current Medications (verified): 1)  Aciphex 20 Mg  Tbec (Rabeprazole Sodium) .[name].. 1 Once Daily 2)  Levoxyl 100 Mcg  Tabs (Levothyroxine Sodium) .... Take One Tab By Mouth Once Daily 3)  Lexapro 20 Mg Tabs (Escitalopram Oxalate) .[name].. 1 Tab Once Daily 4)  Alphagan P 0.1 %  Soln (Brimonidine Tartrate) .... Two Times A Day 5)  Multivitamins   Tabs (Multiple Vitamin) .... Once Daily 6)  Metamucil 30.9 %  Powd (Psyllium) 7)  Align  Caps (Misc Intestinal Flora Regulat) .... As Needed 8)  Acidophilus  Tabs (Lactobacillus) .... As Needed 9)  Hydrochlorothiazide 12.5 Mg  Tabs (Hydrochlorothiazide) .... Take 1 Tab  By Mouth Every Morning 10)  Allergy Shot .... Every Other Week 11)  Pindolol 5 Mg Tabs (Pindolol) .... Take 1 Tablet Every Day 12)  Klor-Con M10 10 Meq Cr-Tabs (Potassium Chloride Crys Cr) .[name].. 1 By Mouth Qd 13)  Vitamin D 1000 Unit Tabs (Cholecalciferol) .... Once Daily  Allergies: 1)  ! Diovan 2)  ! Norvasc 3)  ! Verapamil 4)  ! * Lotrel 5)  Clonidine Hcl  Past History:  Past Medical History: Last updated: 06/19/2008 Breast cancer, hx of GERD Hypertension Hypothyroidism Osteoarthritis Osteopenia Bradycardia Gyn Dr Ashley Royalty GI DR Medoff Psych Dr  Lolly Mustache Anxiety IBS Brady/pacemaker Chronotropic incompetence with normal pacemaker function Orthostatic intolerance  Past Surgical History: Last updated: 01/13/2009 Mastectomy B Pacemaker 2009 L foot surgery  Family History: Last updated: 03/22/2007 Family History Hypertension  Social History: Last updated: 04/22/2009 Retired She is a vegan now 2010 Single Never Smoked Alcohol Use - no  Review of Systems  The patient denies anorexia, fever, weight loss, weight gain, vision loss, decreased hearing, hoarseness, chest pain, syncope, dyspnea on exertion, peripheral edema, prolonged cough, headaches, hemoptysis, abdominal pain, melena, hematochezia, severe indigestion/heartburn, hematuria, incontinence, genital sores, muscle weakness, suspicious skin lesions, transient blindness, difficulty walking, depression, unusual weight change, abnormal bleeding, enlarged lymph nodes, angioedema, and breast masses.         leg cramps  Physical Exam  General:  Well developed, well nourished, in no acute distress. Head:  Normocephalic and atraumatic without obvious abnormalities. No apparent alopecia or balding. Eyes:  No corneal or conjunctival inflammation noted. EOMI. Perrla Nose:  External nasal examination shows no deformity or inflammation. Nasal mucosa are pink and moist without lesions or exudates. Mouth:  No thrush present Neck:  JVP normal Lungs:  clear to auscultation. No rales, or wheeze Heart:  regular rate, rhythm, S1, S2. No murmur Abdomen:  supple. Normal bowel sounds. No masses Msk:  R troch bursa area is less tender Lumbar-sacral spine is tender to palpation over paraspinal muscles and painfull with the ROM  Pulses:  R and L carotid,radial,femoral,dorsalis pedis and posterior tibial pulses are full and equal bilaterally Extremities:  No clubbing, cyanosis, edema, or deformity noted with normal full range of motion of all joints.   Neurologic:  No cranial nerve deficits  noted. Station and gait are normal. Plantar reflexes are down-going bilaterally. DTRs are symmetrical throughout. Sensory, motor and coordinative functions appear intact. Skin:  Intact without suspicious lesions or rashes Cervical Nodes:  No lymphadenopathy noted Inguinal Nodes:  No significant adenopathy Psych:  depressed affect.  Not tearful   Impression & Recommendations:  Problem # 1:  PREOPERATIVE EXAMINATION (ICD-V72.84) Assessment New She is clear for cataract surgery BMET - WNL  Problem # 2:  HYPOTHYROIDISM (ICD-244.9) Assessment: Unchanged  Her updated medication list for this problem includes:    Levoxyl 100 Mcg Tabs (Levothyroxine sodium) .[name]... Take one tab by mouth once daily  Problem # 3:  ANXIETY (ICD-300.00) Assessment: Comment Only  Her updated medication list for this problem includes:    Lexapro 20 Mg Tabs (Escitalopram oxalate) .[name]... 1 tab once daily  Problem # 4:  HYPOKALEMIA (ICD-276.8) corrected Assessment: Comment Only  Problem # 5:  CRAMP OF LIMB (ICD-729.82) Assessment: Unchanged Hold HCTZ x 2-3 d as needed Stretch calves Tylenol PM - declined  Problem # 6:  DEPRESSION (ICD-311) Assessment: Improved  Her updated medication list for this problem includes:    Lexapro 20 Mg Tabs (Escitalopram oxalate) .[name]... 1 tab once daily  Complete Medication List: 1)  Aciphex 20 Mg Tbec (Rabeprazole sodium) .[name].. 1 once daily 2)  Levoxyl 100 Mcg Tabs (Levothyroxine sodium) .... Take one tab by mouth once daily 3)  Lexapro 20 Mg Tabs (Escitalopram oxalate) .[name].. 1 tab once daily 4)  Alphagan P 0.1 % Soln (Brimonidine tartrate) .... Two times a day 5)  Multivitamins Tabs (Multiple vitamin) .... Once daily 6)  Metamucil 30.9 % Powd (Psyllium) 7)  Align Caps (Misc intestinal flora regulat) .... As needed 8)  Acidophilus Tabs (Lactobacillus) .... As needed 9)  Hydrochlorothiazide 12.5 Mg Tabs (Hydrochlorothiazide) .... Take 1 tab  by mouth every morning 10)   Allergy Shot  .... Every other week 11)  Pindolol 5 Mg Tabs (Pindolol) .... Take 1 tablet every day 12)  Klor-con M10 10 Meq Cr-tabs (Potassium chloride crys cr) .[name].. 1 by mouth qd 13)  Vitamin D 1000 Unit Tabs (Cholecalciferol) .... Once daily  Patient Instructions: 1)  Please schedule a follow-up appointment in 3 months.

## 2010-05-26 NOTE — Procedures (Signed)
Summary: [facility]   Allergies: 1)  ! Diovan 2)  ! Norvasc 3)  ! Verapamil 4)  ! * Lotrel 5)  Clonidine Hcl  PPM Specifications Following MD:  Sherryl Manges, MD     PPM Vendor:  [employer]     PPM Model Number:  [id number]     PPM Serial Number:  [id number] PPM DOI:  09/20/2007     PPM Implanting MD:  Sherryl Manges, MD  Lead 1    Location: RA     DOI: 09/20/2007     Model #: [id number]     Serial #: [id number]     Status: active Lead 2    Location: RV     DOI: 09/20/2007     Model #: [id number]     Serial #: [id number]     Status: active  Magnet Response Rate:  BOL 85 ERI 65  Indications:  Chronotropic Incompetence   PPM Follow Up Remote Check?  No Battery Voltage:  2.79 V     Battery Est. Longevity:  9.5 years     Pacer Dependent:  No       PPM Device Measurements Atrium  Amplitude: 4.0 mV, Impedance: 488 ohms, Threshold: 0.75 V at 0.4 msec Right Ventricle  Amplitude: 5.6 mV, Impedance: 505 ohms, Threshold: 0.5 V at 0.4 msec  Episodes MS Episodes:  0     Percent Mode Switch:  0     Coumadin:  No Ventricular High Rate:  1     Atrial Pacing:  89.5%     Ventricular Pacing:  0.4%  Parameters Mode:  DDDR     Lower Rate Limit:  70     Upper Rate Limit:  120 Paced AV Delay:  260     Sensed AV Delay:  230 Tech Comments:  No parameter changes.  Device checked by industry.  [employer] transmissions every 3 months.  ROV 1 year with Dr. Graciela Husbands. Altha Harm, LPN  March 05, 2010 1:05 PM

## 2010-05-26 NOTE — Medication Information (Signed)
Summary: Alternative/[employer]  Alternative/[employer] Medicare[employer]   Imported By: Lester [place] 08/07/2009 09:44:55  _____________________________________________________________________  External Attachment:    Type:   Image     Comment:   External Document

## 2010-05-26 NOTE — Letter (Signed)
Summary: [facility]   Imported By: Lester [place] 01/01/2010 09:40:15  _____________________________________________________________________  External Attachment:    Type:   Image     Comment:   External Document

## 2010-05-26 NOTE — Letter (Signed)
Summary: [facility]   Imported By: Sherian Rein 02/14/2010 08:23:07  _____________________________________________________________________  External Attachment:    Type:   Image     Comment:   External Document

## 2010-05-26 NOTE — Progress Notes (Signed)
Summary: Aciphex  Phone Note Other Incoming   Summary of Call: Office rec'd letter from [facility] that Aciphex is not covered. They recommend Omeprazole and Nexium, has the patient tried and failed both of these? Please advise. Initial call taken by: Lucious Groves,  July 21, 2009 4:46 PM  Follow-up for Phone Call        Pls ask the pt Follow-up by: Tresa Garter MD,  July 21, 2009 5:41 PM  Additional Follow-up for Phone Call Additional follow up Details #1::        Spoke with the patient and she is getting generic from [place] and is doing fine with it. She will call as needed. Additional Follow-up by: Lucious Groves,  July 22, 2009 12:13 PM    Additional Follow-up for Phone Call Additional follow up Details #2::    OK Follow-up by: Tresa Garter MD,  July 22, 2009 1:22 PM

## 2010-05-26 NOTE — Letter (Signed)
Summary: Remote Device Check  [facility], Main Office  [address]   [place], [place] [address]   Phone: [phone]  Fax: [phone]     December 12, 2009 MRN: [id number]   [name] [address] [place], [place]  [address]   Dear Ms. Kawasaki,   Your remote transmission was recieved and reviewed by your physician.  All diagnostics were within normal limits for you.  __X____Your next office visit is scheduled for:  November 2011 with Dr Graciela Husbands. Please call our office to schedule an appointment.    Sincerely,  Vella Kohler

## 2010-05-26 NOTE — Progress Notes (Signed)
Summary: pt needs refill  Phone Note Refill Request Message from:  [facility] on [facility] on [address]  Refills Requested: Medication #1:  atenalol Initial call taken by: Omer Jack,  January 07, 2010 1:05 PM  Follow-up for Phone Call        pt needing pindolol not atenolol it was filled with #90 3 refills called into pharmacy Follow-up by: Burnett Kanaris, CNA,  January 07, 2010 1:32 PM

## 2010-05-26 NOTE — Letter (Signed)
Summary: Remote Device Check  [facility], Main Office  [address]   [place], [place] [address]   Phone: [phone]  Fax: [phone]     July 01, 2009 MRN: [id number]   [name] [address] [place], [place]  [address]   Dear [name],   Your remote transmission was recieved and reviewed by your physician.  All diagnostics were within normal limits for you.   ___X___Your next office visit is scheduled for:   MAY 2011 WITH DR Graciela Husbands. Please call our office to schedule an appointment.    Sincerely,  Proofreader

## 2010-05-26 NOTE — Progress Notes (Signed)
  Phone Note Outgoing Call   Call placed by: Duncan Dull, RN, BSN,  July 02, 2009 4:06 PM Call placed to: Specialist Summary of Call: Received a fax from Dr. Chalmers Guest (Cataract Specialist) requesting an H&P less than 24 days old from [place]. I called the office and got an answering machine. I left a message making them aware that the pt was last seen in our office in Nov. 2010. I told them to call me if I could be of any further assistance but that I would disregard the fax until I heard back from them since I did not have what they were requesting. Contact number and name left for f/u questions or requests.  Initial call taken by: Duncan Dull, RN, BSN,  July 02, 2009 4:09 PM

## 2010-05-26 NOTE — Miscellaneous (Signed)
Summary: pt instructions  Clinical Lists Changes  Observations: Added new observation of PI CARDIO: Your physician recommends that you schedule a follow-up appointment in: 6 months with Dr Graciela Husbands (08/26/2009 10:27)      Patient Instructions: 1)  Your physician recommends that you schedule a follow-up appointment in: 6 months with Dr Graciela Husbands

## 2010-05-28 NOTE — Letter (Signed)
Summary: Generic Letter  [facility]  [address] [place], [place] [address]   Phone: [phone]  Fax: [phone]    05/13/2010  [id number] [place] [address] [place], [place]  [address]     Ms. Mccarver has declined a flu shot. She should be OK without it.           Sincerely,   Jacinta Shoe MD

## 2010-05-28 NOTE — Letter (Signed)
Summary: [facility]   Imported By: Sherian Rein 04/09/2010 09:56:55  _____________________________________________________________________  External Attachment:    Type:   Image     Comment:   External Document

## 2010-05-28 NOTE — Assessment & Plan Note (Signed)
Summary: 3 mos well/medicare/cd   Vital Signs:  Patient profile:   77 year old female Height:      62 inches Weight:      175 pounds BMI:     32.12 Temp:     98.8 degrees F oral Pulse rate:   88 / minute Pulse rhythm:   regular Resp:     16 per minute BP sitting:   160 / 100  (right arm) Cuff size:   regular  Vitals Entered By: Lanier Prude, CMA(AAMA) (May 13, 2010 8:49 AM) CC: MWV Is Patient Diabetic? No   Primary Care Provider:  Tresa Garter MD  CC:  MWV.  History of Present Illness: The patient presents for a preventive health examination  Patient past medical history, social history, and family history reviewed in detail no significant changes.  Patient is physically active. Depression is negative and mood is good. Hearing is normal, and able to perform activities of daily living. Risk of falling is low and home safety has been reviewed and is appropriate. Patient has normal height, she is overweight, and nl visual acuity w/glasses. Patient has been counseled on age-appropriate routine health concerns for screening and prevention. Education, counseling done. Cognition is nl. C/o R foot gout. The patient presents for a follow up of hypertension, hyperlipidemia   Preventive Screening-Counseling & Management  Alcohol-Tobacco     Alcohol drinks/day: 0     Smoking Status: never  Caffeine-Diet-Exercise     Caffeine use/day: 0     Does Patient Exercise: yes     Type of exercise: walking     Times/week: 2-3     Depression Counseling: further diagnostic testing and/or other treatment is indicated  Comments: Dr Dellia Cloud  Hep-HIV-STD-Contraception     Hepatitis Risk: no risk noted     Dental Visit-last 6 months no     SBE monthly: no     Sun Exposure-Excessive: no  Safety-Violence-Falls     Seat Belt Use: yes     Helmet Use: n/a     Firearms in the Home: no firearms in the home     Smoke Detectors: yes     Violence in the Home: no risk noted     Sexual  Abuse: no     Fall Risk: no  Current Medications (verified): 1)  Aciphex 20 Mg  Tbec (Rabeprazole Sodium) .[name].. 1 Once Daily 2)  Levoxyl 100 Mcg  Tabs (Levothyroxine Sodium) .... Take One Tab By Mouth Once Daily 3)  Lexapro 10 Mg Tabs (Escitalopram Oxalate) .[name].. 1 Tab Once Daily 4)  Alphagan P 0.1 %  Soln (Brimonidine Tartrate) .... Two Times A Day 5)  Multivitamins   Tabs (Multiple Vitamin) .... Once Daily 6)  Metamucil 30.9 %  Powd (Psyllium) 7)  Allergy Shot .... Every Other Week 8)  Pindolol 5 Mg Tabs (Pindolol) .... Take 1 Tablet Every Day 9)  Vitamin D 1000 Unit Tabs (Cholecalciferol) .... Once Daily 10)  Lorazepam 0.5 Mg Tabs (Lorazepam) .... As Needed 11)  Zyrtec Allergy 10 Mg Tabs (Cetirizine Hcl) .... Prn 12)  Allopurinol 300 Mg Tabs (Allopurinol) .... Once Daily  Allergies (verified): 1)  ! Diovan 2)  ! Norvasc 3)  ! Verapamil 4)  ! * Lotrel 5)  Clonidine Hcl  Past History:  Past Surgical History: Last updated: 01/13/2009 Mastectomy B Pacemaker 2009 L foot surgery  Family History: Last updated: 03/22/2007 Family History Hypertension  Social History: Last updated: 04/22/2009 Retired She is a vegan now  2010 Single Never Smoked Alcohol Use - no  Past Medical History: Breast cancer, hx of GERD Hypertension Hypothyroidism Osteoarthritis Osteopenia Bradycardia Gyn Dr Ashley Royalty GI DR [facility] Dr Lolly Mustache Anxiety IBS Brady/pacemaker Orthostatic intolerance Chronotropic incompetence with normal pacemaker function Orthostatic intolerance Discoid lupus - skin Depression Dr Dellia Cloud  Social History: Caffeine use/day:  0 Does Patient Exercise:  yes Dental Care w/in 6 mos.:  no Sun Exposure-Excessive:  no Seat Belt Use:  yes Fall Risk:  no Hepatitis Risk:  no risk noted  Review of Systems       The patient complains of depression.  The patient denies fever, chest pain, abdominal pain, melena, difficulty walking, anorexia, dyspnea on exertion,  hemoptysis, hematochezia, severe indigestion/heartburn, and muscle weakness.    Physical Exam  General:  Well developed, well nourished, in no acute distress. Head:  Normocephalic and atraumatic without obvious abnormalities. No apparent alopecia or balding. Eyes:  No corneal or conjunctival inflammation noted. EOMI. Perrla Nose:  External nasal examination shows no deformity or inflammation. Nasal mucosa are pink and moist without lesions or exudates. Mouth:  No thrush present Neck:  JVP normal Lungs:  clear to auscultation. No rales, or wheeze Heart:  regular rate, rhythm, S1, S2. No murmur Abdomen:  supple. Normal bowel sounds. No masses Msk:  R troch bursa area is less tender Lumbar-sacral spine is tender to palpation over paraspinal muscles and painfull with the ROM  Extremities:  No clubbing, cyanosis, edema, or deformity noted with normal full range of motion of all joints.   Neurologic:  No cranial nerve deficits noted. Station and gait are normal. Plantar reflexes are down-going bilaterally. DTRs are symmetrical throughout. Sensory, motor and coordinative functions appear intact. Skin:  Intact without suspicious lesions or rashes Psych:  depressed affect.  Not tearful   Impression & Recommendations:  Problem # 1:  HEALTH MAINTENANCE EXAM (ICD-V70.0) Assessment New  Overall doing well, age appropriate education and counseling updated and referral for appropriate preventive services done unless declined, immunizations up to date or declined, diet counseling done if overweight, urged to quit smoking if smokes, most recent labs reviewed and current ordered if appropriate, ecg reviewed or declined (interpretation per ECG scanned in the EMR if done); information regarding Medicare Preventation requirements given if appropriate.  I have personally reviewed the Medicare Annual Wellness questionnaire and have noted 1.   The patient's medical and social history 2.   Their use of alcohol,  tobacco or illicit drugs 3.   Their current medications and supplements 4.   The patient's functional ability including ADL's, fall risks, home safety risks and hearing or visual             impairment. 5.   Diet and physical activities 6.   Evidence for depression or mood disorders  The patients weight, height, BMI and visual acuity have been recorded in the chart I have made referrals, counseling and provided education to the patient based review of the above and I have provided the pt with a written personalized care plan for preventive services.    Orders: Medicare -1st Annual Wellness Visit [phone])  Problem # 2:  GOUT, UNSPECIFIED (ICD-274.9) R 1st MTP Assessment: New She saw Dr Yisroel Ramming AND HAD AN INJ IN THE FOOT Her updated medication list for this problem includes:    Allopurinol 300 Mg Tabs (Allopurinol) .[name]... 1/2 once daily Indocin Rx  Problem # 3:  TRANSAMINASES, SERUM, ELEVATED (ICD-790.4) Assessment: New Hold Allopurinol (she started it 2 wks  ago)  Problem # 4:  HYPOTENSION, ORTHOSTATIC (ICD-458.0) Assessment: Improved  Problem # 5:  ELEVATED BLOOD PRESSURE (ICD-796.2) Assessment: Deteriorated  Her updated medication list for this problem includes:    Pindolol 5 Mg Tabs (Pindolol) .[name]... Take 1 tablet every day - I asked her to use it two times a day if BP is up  BP today: 160/100 Prior BP: 145/93 (04/30/2010)  Labs Reviewed: Creat: 1.0 (05/11/2010) Chol: 174 (05/11/2010)   HDL: 52.30 (05/11/2010)   LDL: 106 (05/11/2010)   TG: 78.0 (05/11/2010)  Instructed in low sodium diet (DASH Handout) and behavior modification.    Complete Medication List: 1)  Aciphex 20 Mg Tbec (Rabeprazole sodium) .[name].. 1 once daily 2)  Levoxyl 100 Mcg Tabs (Levothyroxine sodium) .... Take one tab by mouth once daily 3)  Lexapro 10 Mg Tabs (escitalopram Oxalate)  .[name].. 1 tab once daily 4)  Alphagan P 0.1 % Soln (Brimonidine tartrate) .... Two times a day 5)  Multivitamins Tabs (Multiple  vitamin) .... Once daily 6)  Metamucil 30.9 % Powd (Psyllium) 7)  Allergy Shot  .... Every other week 8)  Pindolol 5 Mg Tabs (Pindolol) .... Take 1 tablet every day 9)  Vitamin D 1000 Unit Tabs (Cholecalciferol) .... Once daily 10)  Lorazepam 0.5 Mg Tabs (Lorazepam) .... As needed 11)  Zyrtec Allergy 10 Mg Tabs (Cetirizine hcl) .... Prn 12)  Allopurinol 300 Mg Tabs (Allopurinol) .... 1/2 once daily 13)  Indomethacin 50 Mg Caps (Indomethacin) .[name].. 1 by mouth two times a day as needed gout  Patient Instructions: 1)  Please schedule a follow-up appointment in 1 month. 2)  BMP prior to visit, ICD-9: 3)  Hepatic Panel prior to visit, ICD-9: 4)  uric acid  274.9 790.5 5)  Cut Allopurinol in 1/2 or stop it Prescriptions: ACIPHEX 20 MG  TBEC (RABEPRAZOLE SODIUM) 1 once daily  #90 x 3   Entered and Authorized by:   Tresa Garter MD   Signed by:   Tresa Garter MD on 05/13/2010   Method used:   Print then Give to Patient   RxID:   [id number] INDOMETHACIN 50 MG CAPS (INDOMETHACIN) 1 by mouth two times a day as needed gout  #30 x 2   Entered and Authorized by:   Tresa Garter MD   Signed by:   Tresa Garter MD on 05/13/2010   Method used:   [facility]  [address] [id number]       [address]       [place], [place]  [address]       Ph: [phone] or [phone]       Fax: [phone]   RxID:   [id number]    Orders Added: 1)  Medicare -1st Annual Wellness Visit [G0438] 2)  Est. Patient Level IV [88416]    Contraindications/Deferment of Procedures/Staging:    Test/Procedure: FLU VAX    Reason for deferment: patient declined

## 2010-05-28 NOTE — Letter (Signed)
Summary: [facility]  [facility]   Imported By: Sherian Rein 05/13/2010 14:35:54  _____________________________________________________________________  External Attachment:    Type:   Image     Comment:   External Document

## 2010-05-28 NOTE — Assessment & Plan Note (Signed)
Summary: N5A   Visit Type:  Follow-up Primary Provider:  Tresa Garter MD   History of Present Illness:  Mr s. [name] is seen in followup for orthostatic intolerance, exercise intolerance, and prior pacemaker implantation for sinus node dysfunction.  She is . doing better with regard to dizziness.  She his last depressed. She has chronic shortness of breath and weakness which is stable. There has been no chest pain or edema.  she has developed gout again in her footing on a prednisone ejection yesterday by an orthopedist. No medications were prescribed   Current Medications (verified): 1)  Aciphex 20 Mg  Tbec (Rabeprazole Sodium) .[name].. 1 Once Daily 2)  Levoxyl 100 Mcg  Tabs (Levothyroxine Sodium) .... Take One Tab By Mouth Once Daily 3)  Lexapro 10 Mg Tabs (Escitalopram Oxalate) .[name].. 1 Tab Once Daily 4)  Alphagan P 0.1 %  Soln (Brimonidine Tartrate) .... Two Times A Day 5)  Multivitamins   Tabs (Multiple Vitamin) .... Once Daily 6)  Metamucil 30.9 %  Powd (Psyllium) 7)  Allergy Shot .... Every Other Week 8)  Pindolol 5 Mg Tabs (Pindolol) .... Take 1 Tablet Every Day 9)  Vitamin D 1000 Unit Tabs (Cholecalciferol) .... Once Daily 10)  Lorazepam 0.5 Mg Tabs (Lorazepam) .... As Needed  Allergies (verified): 1)  ! Diovan 2)  ! Norvasc 3)  ! Verapamil 4)  ! * Lotrel 5)  Clonidine Hcl  Past History:  Past Medical History: Last updated: 02/03/2010 Breast cancer, hx of GERD Hypertension Hypothyroidism Osteoarthritis Osteopenia Bradycardia Gyn Dr Ashley Royalty GI DR [facility] Psych Dr Lolly Mustache Anxiety IBS Brady/pacemaker Orthostatic intolerance Chronotropic incompetence with normal pacemaker function Orthostatic intolerance Discoid lupus - skin  Vital Signs:  Patient profile:   77 year old female Height:      62 inches Weight:      175 pounds BMI:     32.12 Pulse rate:   93 / minute BP sitting:   145 / 93  (left arm)  Vitals Entered By: Burnett Kanaris, CNA (April 30, 2010 2:15 PM)  Physical Exam  General:  The patient was alert and oriented in no acute distress. HEENT Normal.  Neck veins were flat, carotids were brisk.  Lungs were clear.  Heart sounds were regular without murmurs or gallops.  Abdomen was soft with active bowel sounds. There is no clubbing cyanosis or edema.swelling and tenderness of her right great toe Skin Warm and dry    PPM Specifications Following MD:  Sherryl Manges, MD     PPM Vendor:  [employer]     PPM Model Number:  [id number]     PPM Serial Number:  [id number] PPM DOI:  09/20/2007     PPM Implanting MD:  Sherryl Manges, MD  Lead 1    Location: RA     DOI: 09/20/2007     Model #: [id number]     Serial #: [id number]     Status: active Lead 2    Location: RV     DOI: 09/20/2007     Model #: [id number]     Serial #: [id number]     Status: active  Magnet Response Rate:  BOL 85 ERI 65  Indications:  Chronotropic Incompetence   PPM Follow Up Battery Voltage:  2.79 V     Battery Est. Longevity:  9.5 yrs     Pacer Dependent:  No       PPM Device Measurements Atrium  Amplitude: 5.60 mV, Impedance: 487 ohms, Threshold:  0.750 V at 0.40 msec Right Ventricle  Amplitude: 8.00 mV, Impedance: 516 ohms, Threshold: 0.50 V at 0.40 msec  Episodes MS Episodes:  0     Coumadin:  No Ventricular High Rate:  0     Atrial Pacing:  84.5%     Ventricular Pacing:  1.2%  Parameters Mode:  DDDR     Lower Rate Limit:  70     Upper Rate Limit:  120 Paced AV Delay:  260     Sensed AV Delay:  230 Next Remote Date:  07/30/2010     Next Cardiology Appt Due:  04/27/2011 Tech Comments:  NORMAL DEVICE FUNCTION.  NO EPISODES SINCE LAST CHECK.  CHANGED RA OUTPUT FROM 1.5 TO 2.0 AND RV OUTPUT FROM 2.00 TO 2.50 V.  CARELINK 07-30-10 AND ROV IN 12 MTHS W/SK. Claris Gladden, RN, BSN  Impression & Recommendations:  Problem # 1:  GOUT, UNSPECIFIED (ICD-274.9)  the patient has recurrence of what is thought to be gout. Will begin her on allopurinol. He is advised to follow up  with Dr. Roselie Skinner  Her updated medication list for this problem includes:    Allopurinol 300 Mg Tabs (Allopurinol) ..... Once daily  Problem # 2:  ATRIAL FIBRILLATION (ICD-427.31)  no recurrent atrial fibrillation was detected by her device Her updated medication list for this problem includes:    Pindolol 5 Mg Tabs (Pindolol) .[name]... Take 1 tablet every day  Her updated medication list for this problem includes:    Pindolol 5 Mg Tabs (Pindolol) .[name]... Take 1 tablet every day  Problem # 3:  HYPOTENSION, ORTHOSTATIC (ICD-458.0) Ithen unable to next hypertension come up on the list of problems. We will follow this along I wonder whether is elevated now because of the pain  Problem # 4:  SINOATRIAL NODE DYSFUNCTION (ICD-427.81) stable post pacemaker she is 100% atrially paced The following medications were removed from the medication list:    Nitro-dur 0.2 Mg/hr Pt24 (Nitroglycerin) .[name]... As directed Her updated medication list for this problem includes:    Pindolol 5 Mg Tabs (Pindolol) .[name]... Take 1 tablet every day  Problem # 5:  PACEMAKER DDD- MDT (ICD-V45.01) Device parameters and data were reviewed and no changes were made   Patient Instructions: 1)  Your physician has recommended you make the following change in your medication: Allopurinol 300mg  daily 2)  Your physician wants you to follow-up in:1 year  You will receive a reminder letter in the mail two months in advance. If you don't receive a letter, please call our office to schedule the follow-up appointment. Prescriptions: ALLOPURINOL 300 MG TABS (ALLOPURINOL) once daily  #30 x 11   Entered by:   Claris Gladden RN   Authorized by:   Nathen May, MD, [facility]   Signed by:   Claris Gladden RN on 04/30/2010   Method used:   [facility]  [address] [id number]       [address]       [place], [place]  [address]       Ph: [phone] or [phone]       Fax: [phone]    RxID:   [id number]

## 2010-06-01 ENCOUNTER — Ambulatory Visit (INDEPENDENT_AMBULATORY_CARE_PROVIDER_SITE_OTHER): Payer: Medicare Other | Admitting: Psychology

## 2010-06-01 DIAGNOSIS — F331 Major depressive disorder, recurrent, moderate: Secondary | ICD-10-CM

## 2010-06-08 ENCOUNTER — Telehealth: Payer: Self-pay | Admitting: Internal Medicine

## 2010-06-09 ENCOUNTER — Other Ambulatory Visit: Payer: Self-pay

## 2010-06-11 ENCOUNTER — Encounter: Payer: Self-pay | Admitting: Internal Medicine

## 2010-06-15 ENCOUNTER — Ambulatory Visit: Payer: BLUE CROSS/BLUE SHIELD | Admitting: Psychology

## 2010-06-17 ENCOUNTER — Other Ambulatory Visit: Payer: Medicare Other

## 2010-06-17 ENCOUNTER — Other Ambulatory Visit: Payer: Self-pay | Admitting: Internal Medicine

## 2010-06-17 ENCOUNTER — Encounter (INDEPENDENT_AMBULATORY_CARE_PROVIDER_SITE_OTHER): Payer: Self-pay | Admitting: *Deleted

## 2010-06-17 DIAGNOSIS — R748 Abnormal levels of other serum enzymes: Secondary | ICD-10-CM

## 2010-06-17 DIAGNOSIS — M109 Gout, unspecified: Secondary | ICD-10-CM

## 2010-06-17 LAB — HEPATIC FUNCTION PANEL: Total Bilirubin: 0.9 mg/dL (ref 0.3–1.2)

## 2010-06-17 LAB — BASIC METABOLIC PANEL
Calcium: 9.1 mg/dL (ref 8.4–10.5)
Chloride: 104 mEq/L (ref 96–112)
Creatinine, Ser: 1 mg/dL (ref 0.4–1.2)

## 2010-06-17 LAB — URIC ACID: Uric Acid, Serum: 6.1 mg/dL (ref 2.4–7.0)

## 2010-06-17 NOTE — Letter (Signed)
Summary: Colonoscopy Letter  [facility]  [address] [place], [place] [address]   Phone: [phone]  Fax: [phone]      June 11, 2010 MRN: [id number]   SHERENE PLANCARTE [address] [place], [place]  [address]   Dear Ms. Ficco,   According to your medical record, it is time for you to schedule a Colonoscopy. The American Cancer Society recommends this procedure as a method to detect early colon cancer. Patients with a family history of colon cancer, or a personal history of colon polyps or inflammatory bowel disease are at increased risk.  This letter has been generated based on the recommendations made at the time of your procedure. If you feel that in your particular situation this may no longer apply, please contact our office.  Please call our office at [phone] to schedule this appointment or to update your records at your earliest convenience.  Thank you for cooperating with [place] to provide you with the very best care possible.   Sincerely,  Hedwig Morton. Juanda Chance, M.D.  [facility] [phone]

## 2010-06-17 NOTE — Consult Note (Signed)
Summary: [facility]  [facility]   Imported By: Sherian Rein 06/10/2010 09:30:23  _____________________________________________________________________  External Attachment:    Type:   Image     Comment:   External Document

## 2010-06-17 NOTE — Progress Notes (Signed)
Summary: refill request  Phone Note Refill Request Message from:  Patient on June 08, 2010 11:32 AM  Refills Requested: Medication #1:  PINDOLOL 5 MG TABS take 1 tablet every day [facility] up to two a day from 1   Method Requested: Telephone to Pharmacy Initial call taken by: Glynda Jaeger,  June 08, 2010 11:33 AM    Prescriptions: PINDOLOL 5 MG TABS (PINDOLOL) take 1 tablet every day  #30 x 6   Entered by:   Lamar Sprinkles, CMA   Authorized by:   Tresa Garter MD   Signed by:   Lamar Sprinkles, CMA on 06/10/2010   Method used:   [facility]  [address] [id number]       [address]       [place], [place]  [address]       Ph: [phone] or [phone]       Fax: [phone]   RxID:   [id number]

## 2010-06-22 ENCOUNTER — Ambulatory Visit (INDEPENDENT_AMBULATORY_CARE_PROVIDER_SITE_OTHER): Payer: Medicare Other | Admitting: Psychology

## 2010-06-22 ENCOUNTER — Encounter: Payer: Self-pay | Admitting: Internal Medicine

## 2010-06-22 ENCOUNTER — Ambulatory Visit (INDEPENDENT_AMBULATORY_CARE_PROVIDER_SITE_OTHER): Payer: Medicare Other | Admitting: Internal Medicine

## 2010-06-22 DIAGNOSIS — M109 Gout, unspecified: Secondary | ICD-10-CM

## 2010-06-22 DIAGNOSIS — F331 Major depressive disorder, recurrent, moderate: Secondary | ICD-10-CM

## 2010-06-22 DIAGNOSIS — R7402 Elevation of levels of lactic acid dehydrogenase (LDH): Secondary | ICD-10-CM

## 2010-06-22 DIAGNOSIS — R7309 Other abnormal glucose: Secondary | ICD-10-CM

## 2010-06-22 DIAGNOSIS — I10 Essential (primary) hypertension: Secondary | ICD-10-CM

## 2010-06-29 ENCOUNTER — Ambulatory Visit: Payer: BLUE CROSS/BLUE SHIELD | Admitting: Psychology

## 2010-07-02 NOTE — Assessment & Plan Note (Signed)
Summary: 1 MO FU/NWS   Vital Signs:  Patient profile:   77 year old female Height:      62 inches (157.48 cm) Weight:      172.25 pounds (78.30 kg) BMI:     31.62 O2 Sat:      98 % on Room air Temp:     98.7 degrees F (37.06 degrees C) oral Pulse rate:   84 / minute Resp:     14 per minute BP sitting:   126 / 82  (right arm) Cuff size:   regular  Vitals Entered By: Burnard Leigh CMA(AAMA) (June 22, 2010 9:30 AM)  O2 Flow:  Room air CC: 1-Mth F/U elevated liver enzymes.Recheck on elevated BP & Gout, both better/sls,cma Is Patient Diabetic? No Comments Pt states she was told to go off of Allopurinol at lat visit and put on Indomethacin.Pt has not taken Indomethacin for fear of the side effects, would like to discuss.   Primary Care Provider:  Tresa Garter MD  CC:  1-Mth F/U elevated liver enzymes.Recheck on elevated BP & Gout, both better/sls, and cma.  History of Present Illness: The patient presents for a follow up of hypertension, GERD, IBS, depression, elev glu  Current Medications (verified): 1)  Aciphex 20 Mg  Tbec (Rabeprazole Sodium) .[name].. 1 Once Daily 2)  Levoxyl 100 Mcg  Tabs (Levothyroxine Sodium) .... Take One Tab By Mouth Once Daily 3)  Lexapro 10 Mg Tabs (Escitalopram Oxalate) .[name].. 1 Tab Once Daily 4)  Alphagan P 0.1 %  Soln (Brimonidine Tartrate) .... Two Times A Day 5)  Multivitamins   Tabs (Multiple Vitamin) .... Once Daily 6)  Metamucil 30.9 %  Powd (Psyllium) 7)  Allergy Shot .... Every Other Week 8)  Pindolol 5 Mg Tabs (Pindolol) .... Take 1 Tablet Every Day 9)  Vitamin D 1000 Unit Tabs (Cholecalciferol) .... Once Daily 10)  Lorazepam 0.5 Mg Tabs (Lorazepam) .... As Needed 11)  Zyrtec Allergy 10 Mg Tabs (Cetirizine Hcl) .... Prn 12)  Allopurinol 300 Mg Tabs (Allopurinol) .... 1/2 Once Daily 13)  Indomethacin 50 Mg Caps (Indomethacin) .[name].. 1 By Mouth Two Times A Day As Needed Gout  Allergies (verified): 1)  ! Diovan 2)  ! Norvasc 3)  !  Verapamil 4)  ! * Lotrel 5)  Clonidine Hcl  Past History:  Past Medical History: Last updated: 05/13/2010 Breast cancer, hx of GERD Hypertension Hypothyroidism Osteoarthritis Osteopenia Bradycardia Gyn Dr Ashley Royalty GI DR Medoff Psych Dr Lolly Mustache Anxiety IBS Brady/pacemaker Orthostatic intolerance Chronotropic incompetence with normal pacemaker function Orthostatic intolerance Discoid lupus - skin Depression Dr Dellia Cloud  Social History: Last updated: 04/22/2009 Retired She is a vegan now 2010 Single Never Smoked Alcohol Use - no  Review of Systems  The patient denies fever, vision loss, chest pain, syncope, dyspnea on exertion, and abdominal pain.         lost wt on diet  Physical Exam  General:  Well developed, well nourished, in no acute distress. Head:  Normocephalic and atraumatic without obvious abnormalities. No apparent alopecia or balding. Nose:  External nasal examination shows no deformity or inflammation. Nasal mucosa are pink and moist without lesions or exudates. Mouth:  No thrush present Lungs:  clear to auscultation. No rales, or wheeze Heart:  regular rate, rhythm, S1, S2. No murmur Abdomen:  supple. Normal bowel sounds. No masses Msk:  R troch bursa area is less tender Lumbar-sacral spine is tender to palpation over paraspinal muscles and painfull with the ROM  Neurologic:  No cranial nerve deficits noted. Station and gait are normal. Plantar reflexes are down-going bilaterally. DTRs are symmetrical throughout. Sensory, motor and coordinative functions appear intact. Skin:  Intact without suspicious lesions or rashes Psych:  depressed affect.  Not tearful   Impression & Recommendations:  Problem # 1:  HYPERTENSION, BENIGN (ICD-401.1) Assessment Improved  Her updated medication list for this problem includes:    Pindolol 5 Mg Tabs (Pindolol) .[name]... Take 1 tablet every day  BP today: 126/82 Prior BP: 160/100 (05/13/2010)  Labs  Reviewed: K+: 4.0 (06/17/2010) Creat: : 1.0 (06/17/2010)   Chol: 174 (05/11/2010)   HDL: 52.30 (05/11/2010)   LDL: 106 (05/11/2010)   TG: 78.0 (05/11/2010)  Problem # 2:  GOUT, UNSPECIFIED (ICD-274.9) Assessment: Improved  Her updated medication list for this problem includes:    Allopurinol 300 Mg Tabs (Allopurinol) .[name]... 1/2 once daily  Elevate extremity; warm compresses, symptomatic relief and medication as directed.   Problem # 3:  HYPOTENSION, ORTHOSTATIC (ICD-458.0) Assessment: Improved Hydration maintanance discussed  Problem # 4:  SYNCOPE (ICD-780.2) Assessment: Improved s/p pacemaker  Problem # 5:  ANXIETY (ICD-300.00) Assessment: Improved On Lexapro Her updated medication list for this problem includes:    Lorazepam 0.5 Mg Tabs (Lorazepam) .[name]... As needed  Complete Medication List: 1)  Aciphex 20 Mg Tbec (Rabeprazole sodium) .[name].. 1 once daily 2)  Levoxyl 100 Mcg Tabs (Levothyroxine sodium) .... Take one tab by mouth once daily 3)  Lexapro 10 Mg Tabs (escitalopram Oxalate)  .[name].. 1 tab once daily 4)  Alphagan P 0.1 % Soln (Brimonidine tartrate) .... Two times a day 5)  Multivitamins Tabs (Multiple vitamin) .... Once daily 6)  Metamucil 30.9 % Powd (Psyllium) 7)  Allergy Shot  .... Every other week 8)  Pindolol 5 Mg Tabs (Pindolol) .... Take 1 tablet every day 9)  Vitamin D 1000 Unit Tabs (Cholecalciferol) .... Once daily 10)  Lorazepam 0.5 Mg Tabs (Lorazepam) .... As needed 11)  Zyrtec Allergy 10 Mg Tabs (Cetirizine hcl) .... Prn 12)  Allopurinol 300 Mg Tabs (Allopurinol) .... 1/2 once daily 13)  Indomethacin 50 Mg Caps (Indomethacin) .[name].. 1 by mouth two times a day as needed gout  Patient Instructions: 1)  Please schedule a follow-up appointment in 3-4 months.   Orders Added: 1)  Est. Patient Level IV [16109]

## 2010-07-06 ENCOUNTER — Ambulatory Visit (INDEPENDENT_AMBULATORY_CARE_PROVIDER_SITE_OTHER): Payer: Medicare Other | Admitting: Psychology

## 2010-07-06 DIAGNOSIS — F331 Major depressive disorder, recurrent, moderate: Secondary | ICD-10-CM

## 2010-07-09 ENCOUNTER — Telehealth: Payer: Self-pay | Admitting: Internal Medicine

## 2010-07-10 LAB — HEPATIC FUNCTION PANEL
ALT: 18 U/L (ref 0–35)
Alkaline Phosphatase: 81 U/L (ref 39–117)
Indirect Bilirubin: 0.8 mg/dL (ref 0.3–0.9)
Total Bilirubin: 1.1 mg/dL (ref 0.3–1.2)
Total Protein: 7.1 g/dL (ref 6.0–8.3)

## 2010-07-10 LAB — TYPE AND SCREEN: Antibody Screen: NEGATIVE

## 2010-07-10 LAB — CBC
HCT: 39.3 % (ref 36.0–46.0)
Hemoglobin: 13.6 g/dL (ref 12.0–15.0)
MCV: 93.7 fL (ref 78.0–100.0)
Platelets: 217 10*3/uL (ref 150–400)
RBC: 4.2 MIL/uL (ref 3.87–5.11)
WBC: 4.4 10*3/uL (ref 4.0–10.5)

## 2010-07-10 LAB — DIFFERENTIAL
Eosinophils Relative: 2 % (ref 0–5)
Lymphocytes Relative: 34 % (ref 12–46)
Lymphs Abs: 1.5 10*3/uL (ref 0.7–4.0)
Monocytes Absolute: 0.3 10*3/uL (ref 0.1–1.0)
Monocytes Relative: 7 % (ref 3–12)

## 2010-07-10 LAB — BASIC METABOLIC PANEL
CO2: 30 mEq/L (ref 19–32)
Chloride: 103 mEq/L (ref 96–112)
GFR calc Af Amer: >60 mL/min (ref >60)
Potassium: 3.8 mEq/L (ref 3.5–5.1)
Sodium: 140 mEq/L (ref 135–145)

## 2010-07-10 LAB — HEMOCCULT GUIAC POC 1CARD (OFFICE): Fecal Occult Bld: NEGATIVE

## 2010-07-13 ENCOUNTER — Ambulatory Visit (INDEPENDENT_AMBULATORY_CARE_PROVIDER_SITE_OTHER): Payer: Medicare Other | Admitting: Psychology

## 2010-07-13 DIAGNOSIS — F331 Major depressive disorder, recurrent, moderate: Secondary | ICD-10-CM

## 2010-07-14 NOTE — Progress Notes (Signed)
Summary: Rx to [facility]  Phone Note Refill Request Call back at Home Phone [phone] P PH   Message from:  Patient  Refills Requested: Medication #1:  ACIPHEX 20 MG  TBEC 1 once daily Called in to [facility] per Pt request @ [phone]   Method Requested: Telephone to Pharmacy Initial call taken by: Burnard Leigh [facility]),  July 10, 2010 9:29 AM    Prescriptions: ACIPHEX 20 MG  TBEC (RABEPRAZOLE SODIUM) 1 once daily  #90 x 6   Entered by:   Burnard Leigh [facility])   Authorized by:   Tresa Garter MD   Signed by:   Burnard Leigh CMA(AAMA) on 07/10/2010   Method used:   Historical   RxID:   [id number]

## 2010-07-20 ENCOUNTER — Ambulatory Visit: Payer: Medicare Other | Admitting: Psychology

## 2010-07-21 ENCOUNTER — Ambulatory Visit (INDEPENDENT_AMBULATORY_CARE_PROVIDER_SITE_OTHER): Payer: Medicare Other | Admitting: Psychology

## 2010-07-21 DIAGNOSIS — F331 Major depressive disorder, recurrent, moderate: Secondary | ICD-10-CM

## 2010-07-27 ENCOUNTER — Ambulatory Visit (INDEPENDENT_AMBULATORY_CARE_PROVIDER_SITE_OTHER): Payer: Medicare Other | Admitting: Psychology

## 2010-07-27 DIAGNOSIS — F331 Major depressive disorder, recurrent, moderate: Secondary | ICD-10-CM

## 2010-07-30 ENCOUNTER — Ambulatory Visit (INDEPENDENT_AMBULATORY_CARE_PROVIDER_SITE_OTHER): Payer: Medicare Other | Admitting: *Deleted

## 2010-07-30 ENCOUNTER — Inpatient Hospital Stay (INDEPENDENT_AMBULATORY_CARE_PROVIDER_SITE_OTHER)
Admission: RE | Admit: 2010-07-30 | Discharge: 2010-07-30 | Disposition: A | Discharge status: Home / Self Care | Payer: Medicare Other | Source: Ambulatory Visit | Attending: Family Medicine | Admitting: Family Medicine

## 2010-07-30 ENCOUNTER — Telehealth: Payer: Self-pay | Admitting: Internal Medicine

## 2010-07-30 DIAGNOSIS — R0989 Other specified symptoms and signs involving the circulatory and respiratory systems: Secondary | ICD-10-CM

## 2010-07-30 DIAGNOSIS — I10 Essential (primary) hypertension: Secondary | ICD-10-CM

## 2010-07-30 DIAGNOSIS — N390 Urinary tract infection, site not specified: Secondary | ICD-10-CM

## 2010-07-30 DIAGNOSIS — I495 Sick sinus syndrome: Secondary | ICD-10-CM

## 2010-07-30 DIAGNOSIS — Z950 Presence of cardiac pacemaker: Secondary | ICD-10-CM

## 2010-07-30 NOTE — Telephone Encounter (Signed)
Pt states that her BP is running high 176/112.

## 2010-07-31 ENCOUNTER — Other Ambulatory Visit: Payer: Self-pay

## 2010-07-31 LAB — POCT I-STAT, CHEM 8
Chloride: 101 mEq/L (ref 96–112)
Glucose, Bld: 78 mg/dL (ref 70–99)
HCT: 45 % (ref 36.0–46.0)
Potassium: 3.5 mEq/L (ref 3.5–5.1)

## 2010-07-31 LAB — POCT URINALYSIS DIP (DEVICE)
Bilirubin Urine: NEGATIVE
Glucose, UA: NEGATIVE mg/dL
Ketones, ur: NEGATIVE mg/dL
Nitrite: NEGATIVE

## 2010-08-01 LAB — URINE CULTURE
Colony Count: 45000
Culture  Setup Time: 201204060520

## 2010-08-03 ENCOUNTER — Ambulatory Visit (INDEPENDENT_AMBULATORY_CARE_PROVIDER_SITE_OTHER): Payer: Medicare Other | Admitting: Psychology

## 2010-08-03 DIAGNOSIS — F331 Major depressive disorder, recurrent, moderate: Secondary | ICD-10-CM

## 2010-08-03 NOTE — Progress Notes (Signed)
Pacer remote check  

## 2010-08-03 NOTE — Telephone Encounter (Signed)
Called patient back and she advised me that she called on 4/5 2 times and never received a call back from a nurse about her high blood pressure so she went to the [facility] urgent care. Apparently her message was never sent to a triage nurse and I was scheduled off on 4/5. I apologized to the patient for the lack of communication. She states that she ended up going to [facility]gent care and they increased her Pindolol to 5 mg 2 times per day and restarted HCTZ at 25mg  every day and started Keflex 500 bid for a UTI. She states that her BP has been the following: 4/7 135/90 4/8 132/90 hr 82 and 4/9 159/94 hr 89. She states that when she stands up sometimes she feels like she is going to pass out. Advised her that I will speak with the DOD and call her back. I spoke with Dr.McLean ( DOD ) and he advised that patient wear compression hose and cut back on HCTZ to 12.5mg  in a few days if the compression hose do not help with orthostatic intolerance.  Called patient back and advised of above. She already owns compression hose so she will starting wearing them again. She will call back if not better or BP becomes unstable. E scribe not working to [employer] so I called pharmacy and left a message for HCTZ 12.5mg   #30 with 1 year refills.

## 2010-08-10 ENCOUNTER — Ambulatory Visit (INDEPENDENT_AMBULATORY_CARE_PROVIDER_SITE_OTHER): Payer: Medicare Other | Admitting: Psychology

## 2010-08-10 ENCOUNTER — Encounter (HOSPITAL_COMMUNITY): Payer: Medicare Other | Admitting: Psychiatry

## 2010-08-10 DIAGNOSIS — F331 Major depressive disorder, recurrent, moderate: Secondary | ICD-10-CM

## 2010-08-12 ENCOUNTER — Telehealth: Payer: Self-pay | Admitting: Internal Medicine

## 2010-08-13 MED ORDER — PINDOLOL 5 MG PO TABS: ORAL_TABLET | ORAL | Status: DC

## 2010-08-13 NOTE — Telephone Encounter (Signed)
Pt taking 5 mg per physician.  10 mg if BP is elevated.  Rx was filled for 2 tabs daily.

## 2010-08-16 ENCOUNTER — Encounter: Payer: Self-pay | Admitting: *Deleted

## 2010-08-17 ENCOUNTER — Ambulatory Visit (INDEPENDENT_AMBULATORY_CARE_PROVIDER_SITE_OTHER): Payer: Medicare Other | Admitting: Psychology

## 2010-08-17 DIAGNOSIS — F331 Major depressive disorder, recurrent, moderate: Secondary | ICD-10-CM

## 2010-08-24 ENCOUNTER — Ambulatory Visit (INDEPENDENT_AMBULATORY_CARE_PROVIDER_SITE_OTHER): Payer: Medicare Other | Admitting: Psychology

## 2010-08-24 DIAGNOSIS — F331 Major depressive disorder, recurrent, moderate: Secondary | ICD-10-CM

## 2010-09-07 ENCOUNTER — Other Ambulatory Visit: Payer: Self-pay | Admitting: *Deleted

## 2010-09-07 ENCOUNTER — Ambulatory Visit (INDEPENDENT_AMBULATORY_CARE_PROVIDER_SITE_OTHER): Payer: Medicare Other | Admitting: Psychology

## 2010-09-07 DIAGNOSIS — F331 Major depressive disorder, recurrent, moderate: Secondary | ICD-10-CM

## 2010-09-07 MED ORDER — LEVOTHYROXINE SODIUM 100 MCG PO TABS: 100.0000 ug | ORAL_TABLET | Freq: Every day | ORAL | Status: DC

## 2010-09-08 NOTE — Assessment & Plan Note (Signed)
[facility]                                 ON-CALL NOTE   NAME:[name], [name]                          MRN:          [id number]  DATE:10/20/2007                            DOB:          October 20, 1929    PRIMARY CARDIOLOGIST:  Pricilla Riffle, MD, [facility]   This is a 77 year old female who recently got a pacemaker.  She stated  that she had been taking Catapres for her blood pressure, but this was  discontinued because of its interaction with Lexapro causing drowsiness.  She had been checking her blood pressure and her diastolic was running  as high as 114 with a systolic as high as 160.   I advised [name] that because she had a pacemaker, we did not need to  worry about her heart going slow any longer.  I called in metoprolol 25  mg b.i.d. to the [facility] on [address], phone number [phone].  [name]  stated that she would start this medication and keep her appointment  next week with Dr. Tenny Craw.  I requested that she continue to follow her  blood pressure and heart rate and to let me know if she was continuing  to have difficulties with elevated blood pressure or side effects from  the medication.      Theodore Demark, PA-C  Electronically Signed      [name] Friends. Dietrich Pates, MD, [facility]  Electronically Signed   RB/MedQ  DD: 10/20/2007  DT: 10/21/2007  Job #: [id number]

## 2010-09-08 NOTE — Consult Note (Signed)
NAMETEJASVI, BRISSETT NO.:  [id number]   MEDICAL RECORD NO.:  [id number]          PATIENT TYPE:  OBV   LOCATION:  3707                         FACILITY:  [facility]   PHYSICIAN:  Bevelyn Buckles. Bensimhon, MDDATE OF BIRTH:  [birth year]   DATE OF CONSULTATION:  09/19/2007  DATE OF DISCHARGE:                                 CONSULTATION   PRIMARY CARE PHYSICIAN:  Georgina Quint. Plotnikov, MD.   CARDIOLOGIST:  Pricilla Riffle, MD.   REQUESTING PHYSICIAN:  Georgina Quint. Plotnikov, MD   REASON FOR CONSULTATION:  Syncope and bradycardia.   HISTORY OF PRESENT ILLNESS:  Ms. Puebla is a very pleasant 77 year old  woman with a history of hypertension, hypothyroidism, irritable bowel  syndrome, paroxysmal atrial fibrillation and bradycardia.  She has been  followed closely by Dr. Tenny Craw.  She had a Holter monitor back in 2006  which showed sinus rhythm with a mean heart rate of 65 and her overall  heart rate range was 51-106.  Dr. Tenny Craw has been following her for  dyspnea on exertion.  She does say she walks on the treadmill on many  days but feel like she just cannot do what she used to do.  She has had  an echocardiogram back in January that showed an EF of 55-60% with mild  to moderate tricuspid regurgitation.  She had moderate pulmonary  hypertension with an estimated RVSP of 54 and a Myoview back in 2003  which was normal and apparently had another Myoview just a few months  ago which was also normal.   Apparently she went to exchange something at the store yesterday and  while she was standing there,she became very lightheaded.  She felt sick  to her stomach, then had a abrupt syncopal episode and hit her head.  She came to the emergency room.  A CT scan was negative for any  significant head injury.  She denies any associated chest pain or  shortness of breath.   While in the hospital on telemetry her heart rates have been ranging  from the mid 40s up to 75.  There have been no  significant pauses.   REVIEW OF SYSTEMS:  Notable for chronic dyspnea and some chronic  dizziness but no previous history of syncope.  She has also had  occasional nausea and arthritis.  No focal neurologic deficits.  No  bright red blood per rectum.  No melena.  Remainder of the review of  systems is negative except for HPI and problem list.   PROBLEM LIST:  1. Bradycardia.  2. A questionable history of paroxysmal atrial fibrillation.  3. Chronic dyspnea.  4. Hypertension.  5. Pulmonary hypertension by echocardiogram.  6. Irritable bowel syndrome.  7. Hypothyroidism.  8. History of remote breast cancer.   MEDICATIONS ON ADMISSION:  1. Lexapro 10 mg a day.  2. Synthroid 100 mcg a day.  3. Caltrate.  4. Centrum multivitamin.  5. Aciphex 20 mg.  6. Metamucil.  7. Alphagan eye drops.  8. Micardis.   ALLERGIES:  AMLODIPINE, unclear reaction.  I suspect this is just  more  of tolerance.   SOCIAL HISTORY:  She is retired.  She currently does a lot of volunteer  work.  No history of tobacco or alcohol abuse.   FAMILY HISTORY:  Negative for coronary artery disease.  Positive for  hypertension.   PHYSICAL EXAM:  She is well-appearing, in no acute distress.  Blood pressure is 132/84, heart rate 66, she is saturating 100% on room  air.  HEENT:  Normal.  NECK:  Supple.  There is no JVD.  Carotids are 2+ bilaterally without  any bruits.  There is no lymphadenopathy or thyromegaly.  A carotid  massage was not performed.  CARDIAC:  PMI is nondisplaced.  She is regular with an S4.  No murmurs.  LUNGS:  Clear.  ABDOMEN:  Soft, nontender, nondistended.  No hepatosplenomegaly, no  bruit, no masses.  Good bowel sounds.  EXTREMITIES:  Warm.  No cyanosis, clubbing or edema.  Good distal pulses  and no rash.  NEUROLOGIC:  She is alert and oriented x3.  Cranial nerves II-XII are  grossly intact.  She moves all four extremities without difficulty.  There is no pronator drift.   Finger-to-nose examination is intact.  Rapid alternating movements are normal.   EKG shows sinus bradycardia at a rate of 55.  There is mild nonspecific  T-wave flattening.  White count is 9.2, hemoglobin is 13.3, platelets  223.  Sodium 137, potassium 3.6, BUN 23, creatinine 1.0.  cardiac  markers are negative x2 with troponin of 0.01.  TSH is normal at 2.0.   ASSESSMENT/PLAN:  A syncopal episode in the setting of a moderate  bradycardia without any high-grade pauses.  I do suspect that her  syncopal episode is likely related to her bradycardia but I cannot prove  this definitely.  We will have electrophysiology see regarding a  possible pacemaker placement versus implantable loop recorder.  Consider  further workup of her pulmonary hypertension as an outpatient.      Bevelyn Buckles. Bensimhon, MD  Electronically Signed     DRB/MEDQ  D:  09/19/2007  T:  09/19/2007  Job:  [id number]

## 2010-09-08 NOTE — Assessment & Plan Note (Signed)
[facility]                            CARDIOLOGY OFFICE NOTE   NAME:[name], [name]                          MRN:          [id number]  DATE:10/26/2007                            DOB:          04-30-1929    IDENTIFICATION:  [name] is a 77 year old woman who actually was just  hospitalized after an episode of syncope at the end of May underwent  permanent pacemaker placement.   Since seen, her blood pressure has been up.  It has been up in the past,  but she says she thinks pacemaker is causing it.   She has cut back her activity since seen also and she says she is giving  out more with activity, but again she has not done her usual.  This is  in addition because her blood pressure has been up.  She denies chest  pain.   MEDICATIONS:  1. Lexapro 10.  2. Synthroid 100.  3. Caltrate with D.  4. Centrum.  5. Multivitamin.  6. Aciphex 20.  7. Metamucil.  8. Alphagan eye drops.  9. Micardis 40.  10.Metoprolol 25 b.i.d. (note this was started after a phone      conversation with Theodore Demark).  The patient not on clonidine.   PHYSICAL EXAMINATION:  GENERAL:  The patient is in no distress.  VITAL SIGNS:  Blood pressure logs from home show pressures anywhere from  the 120s-160s over 90s-110s range.  Blood pressure here 160/100, pulse  is 62 and regular, weight 171.  LUNGS:  Clear.  CHEST:  Pacer site healing well.  CARDIAC EXAM:  Regular rate and rhythm.  S1, S2.  No S3.  ABDOMEN:  Benign.  EXTREMITIES:  No edema.   A 12-lead EKG, atrial pacing 63 beats per minute.  LVH.   IMPRESSION:  1. Hypertension high today.  I would add Maxzide 37.5/25 one tablet      daily.  Follow up labs in 10 days.  Continue other medicines.  I      will also set the patient up for an abdominal ultrasound to      evaluate her renal arteries.  2. History of syncope with recent pacer placement again.  Site looks      good.  Until her blood pressure gets under better  control,  I would      just do activities as tolerated, not get back into full-fledged      activity.  3. Dyspnea.  Myoview shows normal echocardiogram with some increased      right heart pressures back in January 2009.  PFTs with minimal      changes.  We will again need followup.   I will set the patient back in 4-6 weeks to evaluate blood pressure.  Again, we will check labs in 10 days.     Pricilla Riffle, MD, [facility]  Electronically Signed    PVR/MedQ  DD: 10/26/2007  DT: 10/27/2007  Job #: [id number]

## 2010-09-08 NOTE — Assessment & Plan Note (Signed)
[facility]                            CARDIOLOGY OFFICE NOTE   NAME:[name], [name]                          MRN:          [id number]  DATE:05/05/2007                            DOB:          Jun 17, 1929    IDENTIFICATION:  [name] is a 77 year old woman with a history of  hypertension, bradycardia.  I last saw her back in, looks like 2006.   I saw her last in 2006, she complained of shortness of breath with  minimal activity around the house, doing housework.  She had worn a  Holter monitor that showed ranges of heart rate from 51 to 106 beats per  minute, average of 65, no pauses, and there were no diary entries.  In  2003, she had a Cardiolite scan that was negative.  I mentioned  cardiopulmonary stress testing.  I actually do not see that she had this  done, but her chart is thick.   Since seen, she says she just can not do what she thinks she ought to be  able to do.  She is finding she is more short of breath with activity,  just gives out, can not walk on the treadmill like she used to.  No  chest pain.   CURRENT MEDICATIONS:  Include:  1. Lexapro 10.  2. Synthroid 100.  3. Caltrate Plus D.  4. Centrum multivitamin.  5. Aciphex 20.  6. Metamucil.  7. Alphagan eye drops.  8. Micardis 40.   PHYSICAL EXAM:  GENERAL:  The patient is in no distress.  VITAL SIGNS:  Her blood pressure is 162/84, pulse is 50, weight 171,  down from 182 in 2006.  LUNGS:  Clear.  CARDIAC:  Regular rate and rhythm, S1-S2.  No S3, no murmurs.  ABDOMEN:  Benign.  EXTREMITIES:  No edema.   A 12-lead EKG, sinus bradycardia, 51 beats per minute with occasional  PACs, LVH by voltage.  Non-specific ST changes.   IMPRESSION:  Dyspnea.  I am not what this represents.  It looks I have  looked into it, in the past.  I am going to set her up for a stress  Myoview (She has had an adenosine Myoview in the past, to see what she  can do, what her heart rate response is, and  then look at perfusion.  I  need to see if she has had a cardiopulmonary stress test which I am  having a hard time finding, but again it would not change my order for  the Myoview scan at this point, since it is remote timing.   The patient should continue activities as tolerated.  I will be in touch  with her, once I have seen the test results.     Pricilla Riffle, MD, [facility]  Electronically Signed    PVR/MedQ  DD: 05/05/2007  DT: 05/06/2007  Job #: [id number]

## 2010-09-08 NOTE — H&P (Signed)
NAMEJAMALA, [name]                 ACCOUNT NO.:  [id number]   MEDICAL RECORD NO.:  [id number]          PATIENT TYPE:  OBV   LOCATION:  3707                         FACILITY:  [facility]   PHYSICIAN:  Georgina Quint. Plotnikov, MDDATE OF BIRTH:  May 18, 1929   DATE OF ADMISSION:  09/18/2007  DATE OF DISCHARGE:                              HISTORY & PHYSICAL   CHIEF COMPLAINT:  Passed out today.   HISTORY OF PRESENT ILLNESS:  The patient is a 77 year old female who  passed out at the store, earlier today.  She was returning a merchandise  felt lightheaded, dizzy and next thing she remembers she was on the  floor surrounded by people.  She was not feeling well earlier with some  nausea and thought that she may need to stay at home.  She ventured out  anyway.  There has been no chest pain.  Currently, has a headache on the  right side where she hit the floor.   PAST MEDICAL HISTORY:  1. Atrial fibrillation.  2. History of breast cancer, remote.  3. Irritable bowel syndrome.  4. Hypothyroidism.  5. Hypertension.   ALLERGIES:  NORVASC.   CURRENT MEDICATIONS:  1. Lexapro.  2. Metamucil.  3. Synthroid.  4. Aciphex.  5. Micardis, dose is unknown.   SOCIAL HISTORY:  She lives at home.  She does not smoke or drink  alcohol.   FAMILY HISTORY:  Negative for coronary disease.  Positive for  hypertension.   REVIEW OF SYSTEMS:  No previous history of syncope, occasionally dizzy  and lightheaded, frequent GI complaints.  No exertional symptoms.  No  leg swelling.  No diarrhea or vomiting.  The rest of the 10-point review  of systems is as above or negative.   PHYSICAL EXAMINATION:  GENERAL:  She is in no acute distress.  VITAL SIGNS:  Her respirations 12, heart rate 72 regular, blood pressure  not done yet.  She is in no acute distress, alert, oriented and  cooperative.  HEENT:  With moist mucosa.  NECK:  Supple, however, she is currently in a cervical collar.  HEART:  Regular S1-S2.  No  gallop.  ABDOMEN:  Soft, nontender.  No organomegaly.  No masses felt.  EXTREMITIES:  Lower extremities without edema.  Calves nontender.  NEUROLOGIC:  She is alert, oriented, and cooperative.  Denies being  depressed.  LUNGS:  Clear to auscultation and percussion.  NEUROLOGICAL:  Cranial nerves II through XII nonfocal.  The muscle  strength within normal limits.  There is a palpable area of tenderness  over the right occipital area of her skull.  No bleeding.   Labs, EKG and x-rays not available at present.   ASSESSMENT/PLAN:  1. Syncope, possibly vasovagal.  We will admit for 24-hour observation      to telemetry.  Obtain EKG, cardiac markers, other labs.  2. Headache due to contusion of the head.  Obtain C-spine series and a      head CT scan to rule out bleeding/fracture.  3. Nausea.  Possible irritable bowel syndrome.  Will use Phenergan.  We will watch, obtain amylase.  4. Anxiety.  Ativan p.r.n.      Georgina Quint. Plotnikov, MD  Electronically Signed     AVP/MEDQ  D:  09/18/2007  T:  09/19/2007  Job:  [id number]

## 2010-09-08 NOTE — Assessment & Plan Note (Signed)
[facility]                         ELECTROPHYSIOLOGY OFFICE NOTE   NAME:CAINJayliana, Valencia                          MRN:          [id number]  DATE:01/02/2008                            DOB:          03/16/1930    Mrs. Student is seen in followup for pacemaker implanted for chronotropic  incompetence with syncope and bradycardia.  She is status post  mastectomy bilaterally and ended up undergoing right-sided pacemaker  implantation which has been complicated in her mind by persistence of  discomfort across the incision.  There is no swelling and no erythema.   MEDICATIONS:  Include, metoprolol 25 b.i.d. which she thinks is  contributing to a sense of fatigue and aggravated tendency towards  depression.  She also takes Maxzide for blood pressure, as well as  Micardis.   On examination today, her blood pressure was 130/88, her pulse is 83,  and her blood pressure was 120 something this morning in Dr. Loren Racer  office.  Her lungs were clear.  Her heart sounds were regular.  Her  Device pocket was well healed.  There is a small keloid and there is  hyperesthesia.  Extremities were without edema.   Interrogation of Medtronic Versa pulse generator demonstrates P-wave of  5.6, impendence of 518, threshold of 0.75 and 0.4, the RV threshold of  0.5 and 0.4, impedance of 600, threshold of 5.6-16 in the amplitude  side.  She is 90% atrial paced.   IMPRESSION:  1. Syncope and bradycardia, as well as chronotropic incompetence.  2. Status post pacer for the above.  3. Hyperesthesia device site.  4. Sleep disturbance and fatigue.  5. Hypertension on triple therapy including beta-blockers.   I suggested Ms. Flagler that we stop her beta-blocker.  She is to wean  herself off of her next couple of days.   She is to record her blood pressure over couple of weeks and then to  follow up with Dr. Posey Rea in the next little bit of time as to  whether her blood pressure  needs further therapy that is 130/85.   We will see her again in May.     Duke Salvia, MD, [facility]  Electronically Signed    SCK/MedQ  DD: 01/02/2008  DT: 01/03/2008  Job #: [id number]

## 2010-09-08 NOTE — Assessment & Plan Note (Signed)
[facility]                         ELECTROPHYSIOLOGY OFFICE NOTE   NAME:CAINAmy, Gothard                          MRN:          [id number]  DATE:03/05/2008                            DOB:          1930/01/23    Mrs. Sheard is seen today as an add-on because of weakness and dizziness.  She is status post pacer for chronotropic incompetence and syncope.  Unfortunately, she has continued to have dizziness.  Evaluation of her  echocardiogram a month afterward demonstrated no pericardial effusion.   Her lightheadedness is orthostatic and relieved by sitting.  It is also  accompanied by some fatigue.  She has had gradual down titration of her  antihypertensives including Micardis and metoprolol.  They have  intercurrently been discontinued.  Other medications include Lexapro 20,  Synthroid, and Aciphex.   PHYSICAL EXAMINATION:  VITAL SIGNS:  Today, her blood pressure was 98/61  with a pulse of 60 when lying and is 103/68 and 62 when sitting and  standing at 0 minutes it was 76/59 with a pulse of 62, and the blood  pressure has been stable at 5 minutes of 98/69.  NECK:  Neck veins were flat somewhat surprisingly.  LUNGS:  Clear.  HEART:  Sounds were regular without murmurs or gallops.  ABDOMEN:  Soft.  EXTREMITIES:  Had no edema.   Interrogation of her pulse generator demonstrated normal pacemaker  function.  Electrocardiogram dated today demonstrated an atrial pacing  with intrinsic conduction.   IMPRESSION:  1. Orthostatic intolerance and hypotension.  2. Antecedent history of hypertension.  3. Sinus node dysfunction with previously implanted pacemaker;      chronotropic incompetence.  4. History of breast cancer status post radiation therapy with only 1      dose of chemotherapy.   Mrs. Arena has orthostatic hypotension.  I am not quite sure what the  mechanism is that I will need to work into this.  She does not want  medicinal solutions, so we will  try variety of non-pharmacological  maneuvers including:  1. Raising the head of the bed about 68 inches.  2. I have reviewed with her isometric exercises.  3. I have given her prescription for thigh/waist high Jobst stockings      at 30-40 mm of pressure.      In addition, I have suggested that she try to add salt back to her      diet, which is difficult for as      she is a vegetarian. We will see again her in 4 weeks' time and      hopefully, can avoid medicinal therapy.     Duke Salvia, MD, [facility]  Electronically Signed    SCK/MedQ  DD: 03/05/2008  DT: 03/06/2008  Job #: [id number]

## 2010-09-08 NOTE — Op Note (Signed)
NAMEOLIVINE, HIERS NO.:  [id number]   MEDICAL RECORD NO.:  [id number]          PATIENT TYPE:  INP   LOCATION:  3707                         FACILITY:  [facility]   PHYSICIAN:  Duke Salvia, MD, FACCDATE OF BIRTH:  06/22/1929   DATE OF PROCEDURE:  09/20/2007  DATE OF DISCHARGE:                               OPERATIVE REPORT   PREOPERATIVE DIAGNOSIS:  Chronotropic incompetence.   POSTOPERATIVE DIAGNOSIS:  Chronotropic incompetence.   PROCEDURE:  Dual-chamber pacemaker implantation.   DESCRIPTION OF PROCEDURE:  Following obtaining informed consent, the  patient was brought to the electrophysiology laboratory room and placed  on the fluoroscopic table in supine position.  Because she was status  post, left mastectomy, routine prep and drape of the right upper chest  was undertaken.  Lidocaine was infiltrated in the prepectoral groove,  and incision was made and carried down to the prepectoral fascia with  electrocautery. A sharp dissection of pocket was formed similarly.  Hemostasis was obtained.   Thereafter attention was turned gain access to extrathoracic right  subclavian vein, which was accomplished without difficulty without the  aspiration of air or puncture of the artery.  Two separate venipunctures  were accomplished.  Guidewires were placed and retained, and a 0 silk  suture was placed in a figure-of-eight fashion allowed to hang loosely.   Sequentially, a 7-French tearaway introducer sheaths were placed, which  was passed with Medtronic #5076 52-cm active fixation ventricular lead  serial # [id number] and Medtronic #5076 45-cm active fixation atrial  lead serial #[id number].  The ventricular lead was marked with a tie.  Under fluoroscopic guidance, these leads were manipulated to the right  ventricular apex and the right atrial appendage respectively, the  bipolar R wave was 20.2 mV with a pace impedance of 752 ohms, a  threshold 0.8 volts, 0.5  milliseconds.  Current threshold 1.1 MA.  There  is no diaphragmatic pacing at 10 volts from the current of injury was  prominent.   The bipolar P-wave was 2.8 with a pace impedance of 800 ohms, a  threshold 1.2 volts at 0.5 milliseconds (and fallen) the current was 1.5  MA.  There is no diaphragmatic pacing at 10 volts and the current of  injury was stupendous.  These leads were secured to the prepectoral  fascia, then attached to a Medtronic VERSA pulse generator serial  #[id number] serial #[id number].  Ventricular pacing and AV pacing were  identified.  The pocket was copiously irrigated with antibiotic  containing saline solution.  Hemostasis was assured.  Leads and pulse  generator were placed in the pocket secured to the prepectoral fascia.  The wound was closed in three layers in  normal fashion.  Wound was washed, dried and a benzoin Steri-Strip  dressing was applied.  Needle counts, sponge counts, and instrument  counts were correct at the end of procedure according to the staff.  The  patient tolerated the procedure without apparent complications.      Duke Salvia, MD, [facility]  Electronically Signed     SCK/MEDQ  D:  09/20/2007  T:  09/21/2007  Job:  [id number]   cc:   ELECTROPHYSIOLOGY LABORATORY  [facility]

## 2010-09-08 NOTE — Op Note (Signed)
NAMESOFFIA, DOSHIER NO.:  [id number]   MEDICAL RECORD NO.:  [id number]          PATIENT TYPE:  INP   LOCATION:  3707                         FACILITY:  [facility]   PHYSICIAN:  Duke Salvia, MD, FACCDATE OF BIRTH:  1929-10-08   DATE OF PROCEDURE:  09/20/2007  DATE OF DISCHARGE:                               OPERATIVE REPORT   PREOPERATIVE DIAGNOSIS:  Orthostatic lightheadedness.   POSTOPERATIVE DIAGNOSIS:  Orthostatic hypotension.   PROCEDURE:  Tilt table testing.   The patient was admitted for tilt table testing using orthostatic  protocol with 5 minutes at 15 degrees, 5 minutes at 30 degrees, 5  minutes at 45 degrees, and 30 minutes at 70 degrees.  Her baseline blood  pressure was in the range of 160-190.  Head-up tilt table testing  resulted in nadir blood pressure of about 110-115 which was stable over  the duration.  There was minimum symptoms associated with it.   IMPRESSION:  Moderate orthostatic hypotension without accompanying  __________ symptoms.   RECOMMENDATION:  To follow.      Duke Salvia, MD, [facility]  Electronically Signed     SCK/MEDQ  D:  09/20/2007  T:  09/21/2007  Job:  [id number]

## 2010-09-08 NOTE — Assessment & Plan Note (Signed)
[facility]                         ELECTROPHYSIOLOGY OFFICE NOTE   NAME:[name]                          MRN:          [id number]  DATE:04/08/2008                            DOB:          12-Jan-1930    [name] is seen in followup for pacemaker implant.  She has  chronotropic incompetence and also has debilitating orthostatic  intolerance.   This continues to be an issue.  She continues to have fear of falling  out.   Her symptoms are better in the morning since we raised the head of her  bed.  She continues to have dizziness in the shower and with prolonged  walking.   Her medications include Lexapro 20, Detrol, Synthroid 100 mcg, Aciphex,  and Metamucil.   On examination today, her blood pressure is 130/80.  There is some  modest orthostatic intolerance that persisted with her blood pressure  falling from 140/92 to 121/83 going from lying to sitting with then  stabilization in the 130 range.  Her lungs were clear.  The heart sounds  were regular.  No murmurs were appreciated.  The extremities were  without edema.   IMPRESSION:  1. Chronotropic incompetence with normal pacemaker function.  2. Orthostatic intolerance.  3. Concomitant history of high portions of hypertension.  4. History of breast cancer status post radiation therapy.   [name] continues to be symptomatic.  We have advised her look into  pressures, support stockings, and we will assist her in that effort.   She will be back in touch with [place] in about 6-8 weeks' time.     Duke Salvia, MD, [facility]  Electronically Signed    SCK/MedQ  DD: 04/08/2008  DT: 04/09/2008  Job #: [id number]

## 2010-09-08 NOTE — Assessment & Plan Note (Signed)
[facility]                         ELECTROPHYSIOLOGY OFFICE NOTE   NAME:[name], [name]                          MRN:          [id number]  DATE:06/20/2008                            DOB:          27-Jun-1929    Ms. [name] is seen in followup for orthostatic intolerance, exercise  intolerance, and prior pacemaker implantation for sinus node  dysfunction.  Since we last saw her support stockings obtained and  delivered for her by D. Wrenn that A1c has helped significantly with her  orthostasis.  Her description of going upstairs is problematic.  She  describes initially as dizziness, but as I explored it further, it  sounds like what it is the shortness of breath.  The orthostasis that  she has had has much improved and is accompanied occasionally by warmth  flushing and/or diaphoresis and is relieved by sitting.   CURRENT MEDICATIONS:  1. Lexapro 10 down titrated from 20, which has been concurrent with      some improvements in her dizziness.  2. Synthroid 100 mcg.  3. Aciphex.  4. Metamucil.  5. Detrol.   On examination, her blood pressure is 143/89, her pulse was 67, her  weight was 178 which was stable.  Her neck veins were flat.  Her lungs  were clear.  Heart sounds were regular.  The abdomen was soft.  Extremities had no edema.   Pacemaker interrogation demonstrated pretty reasonable heart rate  excursion, and her device was reprogrammed to a lower rate limit of 70  from 60 with the activation of the sleep mode at 55 from midnight to  7:00 a.m.   IMPRESSION:  1. Orthostatic intolerance - improved with compression stockings.  2. Exercise-associated shortness of breath, particularly on climbing      stairs suggestive of inadequate rate response related to the rate      sensor of her pacemaker, hence the aforementioned reprogramming      changes.  3. Sinus node dysfunction prompting implantation of a Medtronic      device.  4. History of  metachronous primary breast cancers status post      bilateral mastectomy.   Ms. [name] is doing considerably better as relates to her aforementioned  issues.  I hope the reprogramming will translate into improved exercise  performance on the stairs.  She is to let us know how she does.  We will  see her again in 5 months' time.     Duke Salvia, MD, [facility]  Electronically Signed    SCK/MedQ  DD: 06/20/2008  DT: 06/21/2008  Job #: [id number]   cc:   Georgina Quint. Plotnikov, MD

## 2010-09-08 NOTE — Op Note (Signed)
NAMELISETH, WANN NO.:  [id number]   MEDICAL RECORD NO.:  [id number]           PATIENT TYPE:   LOCATION:                                 FACILITY:   PHYSICIAN:  Duke Salvia, MD, FACCDATE OF BIRTH:  10/09/1929   DATE OF PROCEDURE:  09/20/2007  DATE OF DISCHARGE:                               OPERATIVE REPORT   PREOPERATIVE DIAGNOSIS:  Bradycardia.   POSTOPERATIVE DIAGNOSIS:  Chronotropic incompetence.   PROCEDURE:  Treadmill testing.   The patient was admitted for treadmill testing to assess chronotropic  incompetence.  She underwent testing on Norton protocol.  After a total  of 7 minutes and 30 seconds at a peak heart rate of 82, the test was  terminated because of fatigue.  I should also note that her blood  pressure at baseline was 119/75 with no increase with exercise.  The  immediate postexercise was 149/52.   IMPRESSION:  1. Chronotropic incompetence.  2. Abnormal vasomotor response.   RECOMMENDATION:  The patient to undergo implantation.      Duke Salvia, MD, [facility]  Electronically Signed     SCK/MEDQ  D:  09/20/2007  T:  09/21/2007  Job:  [id number]

## 2010-09-08 NOTE — Discharge Summary (Signed)
NAMECAY, [name]                 ACCOUNT NO.:  [id number]   MEDICAL RECORD NO.:  [id number]          PATIENT TYPE:  INP   LOCATION:  3707                         FACILITY:  [facility]   PHYSICIAN:  Valerie A. Felicity Coyer, MDDATE OF BIRTH:  Sep 03, 1929   DATE OF ADMISSION:  09/18/2007  DATE OF DISCHARGE:  09/21/2007                               DISCHARGE SUMMARY   DISCHARGE DIAGNOSES:  1. Syncope secondary to bradycardia, status post anemia, permanent      pacemaker placement, Sep 20, 2007.  2. Urinary tract infection, plan to continue Cipro.  3. Hypertension.  4. Irritable bowel syndrome.  5. Elevated lipase with a normal abdominal ultrasound.   HISTORY OF PRESENT ILLNESS:  [name] is a 77 year old African American  female admitted on Sep 18, 2007, after having a syncopal episode earlier  on the day of admission.  She was apparently at the store and was  returning merchandise as she felt lightheaded and dizzy, and next thing  she remembers she was on the floor surrounded by people.  She noted not  feeling well earlier in the day with some nausea.  She thought that she  may need to stay home; however, she went about her errands.  Upon  admission in the emergency department, she complained of headache in the  right side of her head where she had hit the floor.  She was admitted  for further evaluation and treatment.   PAST MEDICAL HISTORY:  1. Atrial fibrillation.  2. History of breast cancer remotely.  3. Irritable bowel syndrome.  4. Hypothyroidism.  5. Hypertension.   COURSE OF HOSPITALIZATION:  1. Syncope with bradycardia.  The patient was admitted.  She was noted      to have bradycardia down into the 30s which was sustained for      several minutes.  A cardiology consult was requested and the      patient was seen by Dr. Graciela Husbands and subsequently underwent permanent      pacemaker placement on Sep 20, 2007.  Postoperatively, she has done      well.  She  has no further symptoms  and has remained stable on      telemetry.  2. Urinary tract infection.  She was noted to have a positive      urinalysis, and we continued on Cipro empirically at the time of      discharge.   DISCHARGE MEDICATIONS:  1. Synthroid 100 mcg p.o. daily.  2. Aciphex 20 mg p.o. daily.  3. Metamucil 1 tablespoon p.o. daily.  4. Catapres 0.1 mg p.o. b.i.d.  5. Lexapro 10 mg p.o. daily.  6. Alphagan 1 drop to each eye twice daily.  7. Micardis 40 mg p.o. daily.  8. Caltrate 1200 mg p.o. daily.  9. Cipro 500 mg p.o. twice daily for 6 days.   LABORATORY DATA:  Pertinent laboratories at the time of discharge, T3  2.3, free T4 1.05, sodium 137, potassium 3.7, BUN 20, creatinine 0.99,  hemoglobin 13.1, hematocrit 38.3, white blood cell count 5.4, platelets  213.   DISPOSITION:  The patient is discharged to home.  She is to follow up  with Dr. Georgina Quint. Plotnikov on Wednesday, October 04, 2007, at 2:00 p.m.  and to follow up with [facility] on  Thursday, October 05, 2007, at 9:40 a.m., for an echocardiogram on Friday,  October 20, 2007, at 9:30 a.m., and with Dr. Dietrich Pates on Thursday, October 26, 2007, at 3 o'clock p.m.  She is also scheduled to see Dr. Graciela Husbands on  Tuesday, January 02, 2008, at 2 o'clock p.m.  Greater than 30 minutes  were spent on discharge planning.      Sandford Craze, NP      Raenette Rover. Felicity Coyer, MD  Electronically Signed    MO/MEDQ  D:  09/21/2007  T:  09/22/2007  Job:  [id number]   cc:   Duke Salvia, MD, [facility]  Pricilla Riffle, MD, [facility]

## 2010-09-08 NOTE — Assessment & Plan Note (Signed)
[facility]                                 ON-CALL NOTE   NAME:[name], [name]                          MRN:          [id number]  DATE:10/27/2007                            DOB:          [birth year]    SUPERVISING PHYSICIAN:  Duke Salvia, MD, [facility]   PRIMARY CARDIOLOGIST:  Pricilla Riffle, MD, [facility]   [name] is a 77 year old female patient.  She was seen by Dr. Tenny Craw  yesterday and Maxzide was added to her medical regimen for her  hypertension.  This medication was supposed to be called in by the  office staff.  However, [name] states the medication has not been  called in to her pharmacy.   I called in to [facility] on [address] at phone number [phone]  and left a message to the pharmacy to provide [name] with Maxzide 37.5  mg/25 mg daily.      Joellyn Rued, PA-C  Electronically Signed      Duke Salvia, MD, [facility]  Electronically Signed   EW/MedQ  DD: 10/27/2007  DT: 10/27/2007  Job #: [id number]

## 2010-09-08 NOTE — Assessment & Plan Note (Signed)
[facility]                            CARDIOLOGY OFFICE NOTE   NAME:[name], [name]                          MRN:          [id number]  DATE:12/14/2007                            DOB:          01-May-1929    IDENTIFICATION:  [name] is a 77 year old woman, who I follow in  clinic.  She has a history of hypertension, syncope with now status post  pacer placement.  I last saw her in clinic back in July.   When I saw her last, I added Maxzide to her regimen.   In the interval, she has done okay.  She is a very anxious about her  medicines, so she is paranoid about things.  She admits this.  She does  not like being on somebody's medicines.  She denies significant  shortness of breath.  She says her pacer site is very sore.  She uses  her right arm quite a bit, because she is right-handed.   Note also, she was diagnosed recently with subacute cutaneous lupus and  is recommended that she start Plaquenil, again she is quite anxious  about this.   CURRENT MEDICINES:  1. Lexapro 10.  2. Synthroid 100.  3. Caltrate with D.  4. Centrum.  5. Multivitamin.  6. Aciphex 20.  7. Metamucil.  8. Alphagan eye drops.  9. Micardis 40.  10.Metoprolol 25 b.i.d.  11.Maxzide 37.5/25.  12.Potassium 10.   PHYSICAL EXAMINATION:  GENERAL:  The patient is in no distress.  VITAL SIGNS:  Blood pressure 130/80, pulse is 82 and regular, and weight  172.  LUNGS:  Clear.  Pacer site without discharge, not swollen, slightly  tender.  ABDOMEN:  Benign.  CARDIAC:  Regular rate and rhythm.  S1 and S2.  No S3.  No significant  murmurs.  EXTREMITIES:  No edema.   LABORATORY STUDIES:  Labs with potassium supplement, potassium is 4.1.  Hemoglobin 13.3, down to 11.17.   IMPRESSION:  1. Hypertension, better control.  We would keep on same regimen.  2. History of pacer placement secondary to syncope with heart block,      sore at the site.  I do not see any evidence of  infection.  We will      discuss with Dr. Graciela Husbands.  3. History of subcutaneous lupus.  I told her, I thought she could be      on Plaquenil again, and I was monitoring the risk and benefit      ratio.  We will discuss with pharmacy, but again she should talk to      her dermatologist.  4. Health care maintenance.  We will need to review when last lipids      were drawn.   ADDENDUM:  In April 2008, LDL of 129, HDL of 45.  I do not think there  have been any since.  We will need to review.     Pricilla Riffle, MD, [facility]  Electronically Signed    PVR/MedQ  DD: 12/14/2007  DT: 12/15/2007  Job #: [id number]   cc:   Leonette Most  Jen Mow, M.D.  Georgina Quint. Plotnikov, MD

## 2010-09-11 NOTE — Discharge Summary (Signed)
[facility]  Patient:    [name], [name]                        MRN: [id number] Adm. Date:  [id number] Disc. Date: [id number] Attending:  Katha Cabal                           Discharge Summary  ADMISSION DIAGNOSIS:  History of breast cancer on the left, new area in right breast with massive breasts.  DISCHARGE DIAGNOSIS:  History of breast cancer, left, new right breast cancer.   PROCEDURES:  Bilateral mastectomies, October 14, 2000.  HOSPITAL COURSE:  The patient is a 77 year old lady, who came in on June 21 for bilateral mastectomies.  These were performed.  Jackson-Pratt drains were placed bilaterally.  She required two days hospitalization to recuperate and was ready for discharge to be followed up in the office.  The drains were left in as were her staples.  DISCHARGE INSTRUCTIONS:  Return within the week for followup. DD:  11/09/00 TD:  11/10/00 Job: [id number]

## 2010-09-11 NOTE — Procedures (Signed)
[place]. [facility]  Patient:    CADE, OLBERDING Visit Number: [id number] MRN: [id number]          Service Type: END Location: ENDO Attending Physician:  Mervin Hack Dictated by:   Hedwig Morton. Juanda Chance, M.D. [facility] Proc. Date: 01/18/01 Admit Date:  01/18/2001                             Procedure Report  PREPROCEDURE DIAGNOSIS:  POST PROCEDURE DIAGNOSIS:  PROCEDURE:  Flexible sigmoidoscopy and hemorrhoidal destruction.  PHYSICIAN:  Hedwig Morton. Juanda Chance, M.D.  ANESTHESIA:  Versed 5 mg IV, Fentanyl 50 mcg IV.  INDICATION FOR PROCEDURE:  This 77 year old African-American female has been followed for symptomatic diverticulosis.  Last colonoscopy two years ago confirmed presence of left-sided diverticulosis.  She has, in the last two weeks, had a large amount of bright red blood per rectum with abdominal pain. Exam in the office by Dr. Arlyce Dice confirmed presence of hemorrhoids.  She has been on Anusol [facility] suppositories for the past several days and is now undergoing flexible sigmoidoscopy to assess her rectal bleeding which is likely to be due to hemorrhoids and to proceed with hemorrhoidal injection if indicated.  ENDOSCOPE:  Fujinon single channel video endoscope.  FINDINGS:  Fujinon single channel video endoscope passed under direct vision through the rectum to the sigmoid colon.  The patient was monitored by pulse oximeter.  Oxygen saturations were normal.  ___________ only marginal.  There was some solid stool throughout the sigmoid colon.  Anal canal showed normal rectal tone with first grade hemorrhoids. There was no prolapse of the hemorrhoids.  On retroflexion of the endoscope, two hemorrhoids next to each other were noticed to be protruding into the rectal ampulla, one of them had visible vessel that was organized and did not show any stigmata of recent bleeding but was most likely the site of the previous bleeding.  This area was injected with 1  cc of hypertonic saline and the injection was repeated next to the protruding hemorrhoid. There was minimal bleeding from the injection site. Exam of the rectosigmoid and sigmoid colon confirmed presence of mild diverticulosis. There was no stigmata of bleeding from the diverticulosis. Exam was carried up to 60 cm at which point colonoscope was then retracted and colon decompressed.  The patient tolerated the procedure well.  IMPRESSION: 1. First grade hemorrhoids with stigmata of visible vessel, status post    destruction of internal hemorrhoids with hypertonic saline. 2. Mild diverticulosis of the left colon.  PLAN:  The patient will continue Anusol [facility] suppositories at bedtime.  Stool softener, avoid straining. She will report to use if she has recurrent rectal bleeding. Dictated by:   Hedwig Morton. Juanda Chance, M.D. [facility] Attending Physician:  Mervin Hack DD:  01/18/01 TD:  01/18/01 Job: [id number]

## 2010-09-11 NOTE — Op Note (Signed)
[place]. [facility]  Patient:    [name], [name]                        MRN: [id number] Proc. Date: 10/14/00 Adm. Date:  [id number] Attending:  Katha Cabal CC:         [name], M.D.   Operative Report  CCS#:  [id number]  PREOPERATIVE DIAGNOSIS:  History of left breast cancer and massive breast hypertrophy with new finding in right breast causing increased worry and anxiety regarding recurrent cancer.  POSTOPERATIVE DIAGNOSIS:  History of left breast cancer and massive breast hypertrophy with new finding in right breast causing increased worry and anxiety regarding recurrent cancer.  PROCEDURE: Bilateral total mastectomy.  SURGEON:  Thornton Park. Daphine Deutscher, M.D.  Threasa HeadsOrson Slick.  DESCRIPTION OF PROCEDURE:  [name] was taken to room 17 and given general anesthesia.  She received Ancef preoperatively.  The entire chest including her very large breasts were prepped with Betadine and then draped sterilely again trying to maintain a sterile field when in fact they had a tendency to hang outside of the sterile field.  These were, after prepping, then wrapped. Draping was accomplished and both breasts were marked to try to make her incision symmetrical, although on the left side, which I did second, I did increase the size of my skin flaps slightly.  The procedure began on the right side where the skin flaps were created using a knife and were mobilized superiorly, inferomedially and laterally with a Bovie.  Electrocautery and 4-0 Vicryl were used to control bleeding.  This was carried down to the chest wall and the breast was removed along with the pectoral muscles leaving some fascia and then sweeping this up along the lateral border of the pectoralis major toward the axillae but an axillary dissection was not performed on the right side.  The area was then irrigated, a drain was brought in from below and the skin was closed with 4-0  Vicryl and with staples.  There was a little more tension in the mid portion of this incision and so I did make a little bit of change on the patients left side.  We then moved to the left side and inscribed the aforementioned flaps using a knife that I had previously marked before.  Flaps were raised again with electrocautery.  These were carried superiorly, inferiorly, medially and then the breasts again were swept off the chest wall.  Bleeding was controlled again with 4-0 and electrocautery.  The wound was closed with 4-0 Vicryl and with staples.  Again a drain was brought from below.  These were 19 blake drains and they were sutured to the skin with 3-0 nylon.  The indications of this procedure and discussion were initiated by the patient.  She had previously considered reduction mammoplasty and with her history of breast cancer on the left side and the anxiety that incurred when she contemplated either recurrent breast cancer on the left side or new breast cancer on the right side, it was brought out when a recent mammogram revealed some subtle changes although we reassured her that we felt these were benign. After a prolonged discussion she wanted to go ahead and proceed with a total mastectomy.  Aggregate weight of both breasts was approximately 5.2 kilograms with the right being larger than the left which goes along with the previous lumpectomy on the left side.  There is no gross evidence of persistent  cancer, but permanent pathology sections are pending. DD:  10/14/00 TD:  10/16/00 Job: [id number]

## 2010-09-11 NOTE — Op Note (Signed)
[place]. [facility]  Patient:    [name], [name] Visit Number: [id number] MRN: [id number]          Service Type: DSU Location: [name] Attending Physician:  Katha Cabal Dictated by:   Alfredia Ferguson, M.D. Proc. Date: 01/17/01 Admit Date:  01/17/2001                             Operative Report  PREOPERATIVE DIAGNOSIS:  Excess skin and fat bilateral axillary region, post mastectomy for breast cancer.  POSTOPERATIVE DIAGNOSIS:  Excess skin and fat bilaterally axillary region, post mastectomy for breast cancer.  OPERATION:  Excision of large standing cone of skin and fat right axilla and left axilla.  SURGEON:  Alfredia Ferguson, M.D.  ANESTHESIA:  General endotracheal.  INDICATIONS FOR PROCEDURE:  The patient is a 77 year old black female who is status post bilateral mastectomies.  She had very large breasts at the time of the mastectomy, and a portion of the axillary extension of the breast was left.  The patient has a proximate 12 to 14 cm x 8 cm standing cone of skin and fat which interferes with her adduction of her arm.  The area becomes macerated due to its location beneath the arm.  The patient wishes to have both areas excised.  The right side is much larger than the left side.  The left side has been previously radiated, and for that reason a more conservative excision will be done on the left side.  DESCRIPTION OF PROCEDURE:  The patient in standing position.  The skin marks were placed, outlining the dimensions of the excess skin and fat bilaterally. The patient was taken to the operating room where she was given general endotracheal anesthesia.  The patient was rolled in a partial lateral decubitus position with the right side up.  The right axilla and proximal arm area were prepped and draped in a sterile fashion.  An elliptical incision was made around the standing cone of skin and fat.  The incision was deepened through the  skin and fatty tissue.  Fatty tissue was elevated off of the lateral chest wall, and then the superior incision was deepened until I met the previously placed plane against the chest wall.  Dr. Daphine Deutscher at this time came in to explore the questionable mass in the right axilla.  There was a thickening which was removed by Dr. Daphine Deutscher.  The wound was copiously irrigated with saline irrigation.  Hemostasis was accomplished using electrocautery. Wound edges were undermined on the inferior portion of my incision to allow eversion of the skin edges.  A 10 mm Jackson-Pratt drain was placed in the wound and brought through a separate stab incision.  The wound was closed by proximating the dermis using multiple interrupted 3-0 Vicryl suture, followed by a running 3-0 Vicryl subcuticular.  The patients right axillary region was now cleansed, dried, and a bandage was applied.  The patient was rolled into a partial right lateral decubitus position, and an identical procedure was performed on the patients left side.  Following closure, the area was cleansed, dried, and dressing was applied.  The patient tolerated the procedure well with minimal blood loss.  She was awakened, extubated, and transported to the recovery room in satisfactory condition. Dictated by:   Alfredia Ferguson, M.D. Attending Physician:  Katha Cabal DD:  01/17/01 TD:  01/17/01 Job: [id number]

## 2010-09-11 NOTE — Op Note (Signed)
[place]. [facility]  Patient:    [name], [name] Visit Number: [id number] MRN: [id number]          Service Type: DSU Location: [facility] Attending Physician:  Katha Cabal Dictated by:   Thornton Park Daphine Deutscher, M.D. Proc. Date: 01/17/01 Admit Date:  01/17/2001 Discharge Date: 01/17/2001                             Operative Report  PREOPERATIVE DIAGNOSIS:  POSTOPERATIVE DIAGNOSIS:  OPERATION PERFORMED:  SURGEON:  Molli Hazard B. Daphine Deutscher, M.D.  ANESTHESIA:  General.  INDICATIONS FOR PROCEDURE:  The patient is a 77 year old female who is status post bilateral mastectomies.  She is undergoing reduction of removal of two large fat masses laterally actually on her back by Dr. Benna Dunks.  There was a question of a mass in her right axilla which was palpable about two weeks prior to this time but which went away.  During the course of this excision, biopsy or exploration by me was recommended.  DESCRIPTION OF PROCEDURE:  During the right large mass excision I dissected up along into what was the only thing I could feel that felt a little firm and hard and came down under the retroscar region and biopsied an area of scar. There was no palpable mass suggesting any palpable adenopathy in the right axilla.  Once this was done, this was sent for permanent sections.  Dr. Benna Dunks completed the right side and subsequently went and did the left side.  The patient will be kept for overnight observation. Dictated by:   Thornton Park Daphine Deutscher, M.D. Attending Physician:  Katha Cabal DD:  01/19/01 TD:  01/19/01 Job: [id number] [id number]

## 2010-09-11 NOTE — Op Note (Signed)
[place]. [facility]  Patient:    [name]                         MRN: [id number] Proc. Date: 03/31/99 Adm. Date:  [id number] Attending:  Derrek Monaco CC:         Katy Fitch. Naaman Plummer., M.D. x 2             Kerry Kass, M.D. [facility]             Matthew B. Daphine Deutscher, M.D.                           Operative Report  PREOPERATIVE DIAGNOSIS:  Severe chronic stenosing tenosynovitis of left first dorsal compartment.  POSTOPERATIVE DIAGNOSIS:  Severe chronic stenosing tenosynovitis of left first dorsal compartment.  OPERATION:  Release of left first dorsal compartment with resection of thick septum between extensor pollicis brevis and abductor pollicis longus tendons.  SURGEON:  Katy Fitch. Naaman Plummer., M.D.  ASSISTANT:  Rhoderick Moody, P.A.  ANESTHESIA:  Quarter percent Marcaine and 2% lidocaine field block supplemented by IV sedation.  SUPERVISING ANESTHESIOLOGIST:  Bedelia Person, M.D.  INDICATIONS:  The patient is a 77 year old woman who has had chronic stenosing tenosynovitis for more than six months at her left wrist.  She has not responded to splinting, activity modification and injection.  Due to failure to respond, operative measures is felt best at this time for release of her left first dorsal compartment.  DESCRIPTION OF PROCEDURE:  The patient was brought to the operating room and placed in the supine position on the operating table.  Following light sedation, the left arm was prepped with Betadine soap and solution and sterilely draped. Following exsanguination of the limb with an Esmarch bandage, arterial tourniquet was placed at 220 mmHg.  Quarter percent Marcaine and 2% lidocaine were infiltrated in the  path of the intended incision.  When anesthesia was satisfactory, a short transverse incision was fashioned directly over the palpably enlarged first dorsal compartment.  Several radial sensory branches were noticed to  be adherent to the compartment due to chronic inflammation.  These were gently dissected off the compartment with scissors and bluntly retracted.  The compartment was released under direct vision with the scalpel and scissors.  Two slips of the abductor pollicis longus were noted in he palmar compartment and a second dorsal compartment was noted with a thick 3 mm ide septum, separating the two compartments.  The extensor pollicis brevis was located in the dorsal compartment.  The septum was removed with rongeurs.  The wound was then inspected for bleeding points and tourniquet released.  The wound was repaired with intradermal 3-0 Prolene suture and Steri-Strips.  A pressure dressing was applied with Xeroflow, sterile gauze and Ace wrap.  There  were no apparent complications. DD:  03/31/99 TD:  04/01/99 Job: [id number]

## 2010-09-15 ENCOUNTER — Encounter (INDEPENDENT_AMBULATORY_CARE_PROVIDER_SITE_OTHER): Payer: Self-pay | Admitting: Surgery

## 2010-09-15 ENCOUNTER — Ambulatory Visit: Payer: Medicare Other | Admitting: Psychology

## 2010-09-17 ENCOUNTER — Encounter (INDEPENDENT_AMBULATORY_CARE_PROVIDER_SITE_OTHER): Payer: Self-pay | Admitting: Surgery

## 2010-09-22 ENCOUNTER — Ambulatory Visit (INDEPENDENT_AMBULATORY_CARE_PROVIDER_SITE_OTHER): Payer: Medicare Other | Admitting: Psychology

## 2010-09-22 DIAGNOSIS — F331 Major depressive disorder, recurrent, moderate: Secondary | ICD-10-CM

## 2010-09-28 ENCOUNTER — Ambulatory Visit (INDEPENDENT_AMBULATORY_CARE_PROVIDER_SITE_OTHER): Payer: Medicare Other | Admitting: Psychology

## 2010-09-28 DIAGNOSIS — F331 Major depressive disorder, recurrent, moderate: Secondary | ICD-10-CM

## 2010-09-29 ENCOUNTER — Other Ambulatory Visit: Payer: Self-pay | Admitting: *Deleted

## 2010-09-29 MED ORDER — PINDOLOL 5 MG PO TABS: ORAL_TABLET | ORAL | Status: DC

## 2010-10-16 ENCOUNTER — Encounter: Payer: Self-pay | Admitting: Internal Medicine

## 2010-10-19 ENCOUNTER — Ambulatory Visit (INDEPENDENT_AMBULATORY_CARE_PROVIDER_SITE_OTHER): Payer: Medicare Other | Admitting: Psychology

## 2010-10-19 ENCOUNTER — Encounter (INDEPENDENT_AMBULATORY_CARE_PROVIDER_SITE_OTHER): Payer: Medicare Other | Admitting: Psychiatry

## 2010-10-19 ENCOUNTER — Encounter: Payer: Medicare Other | Admitting: Internal Medicine

## 2010-10-19 DIAGNOSIS — F331 Major depressive disorder, recurrent, moderate: Secondary | ICD-10-CM

## 2010-10-20 ENCOUNTER — Ambulatory Visit (INDEPENDENT_AMBULATORY_CARE_PROVIDER_SITE_OTHER): Payer: Medicare Other | Admitting: Internal Medicine

## 2010-10-20 ENCOUNTER — Encounter: Payer: Self-pay | Admitting: Internal Medicine

## 2010-10-20 DIAGNOSIS — F3289 Other specified depressive episodes: Secondary | ICD-10-CM

## 2010-10-20 DIAGNOSIS — I10 Essential (primary) hypertension: Secondary | ICD-10-CM

## 2010-10-20 DIAGNOSIS — F329 Major depressive disorder, single episode, unspecified: Secondary | ICD-10-CM

## 2010-10-20 DIAGNOSIS — H109 Unspecified conjunctivitis: Secondary | ICD-10-CM

## 2010-10-20 DIAGNOSIS — E559 Vitamin D deficiency, unspecified: Secondary | ICD-10-CM

## 2010-10-20 DIAGNOSIS — K219 Gastro-esophageal reflux disease without esophagitis: Secondary | ICD-10-CM

## 2010-10-20 DIAGNOSIS — E039 Hypothyroidism, unspecified: Secondary | ICD-10-CM

## 2010-10-20 DIAGNOSIS — I4891 Unspecified atrial fibrillation: Secondary | ICD-10-CM

## 2010-10-20 DIAGNOSIS — M810 Age-related osteoporosis without current pathological fracture: Secondary | ICD-10-CM

## 2010-10-20 DIAGNOSIS — F411 Generalized anxiety disorder: Secondary | ICD-10-CM

## 2010-10-20 MED ORDER — ERYTHROMYCIN 5 MG/GM OP OINT: TOPICAL_OINTMENT | Freq: Every day | OPHTHALMIC | Status: AC

## 2010-10-20 MED ORDER — FUROSEMIDE 20 MG PO TABS: 20.0000 mg | ORAL_TABLET | Freq: Every day | ORAL | Status: DC

## 2010-10-20 NOTE — Assessment & Plan Note (Signed)
On Rx take Lexapro at 6 pm

## 2010-10-20 NOTE — Assessment & Plan Note (Signed)
Erythro oint 

## 2010-10-20 NOTE — Progress Notes (Signed)
  Subjective:    Patient ID: [name], female    DOB: 04/18/1930, 77 y.o.   MRN: [id number]  HPI   The patient is here to follow up on chronic depression, anxiety, headaches and chronic moderate IBS symptoms controlled with medicines, diet and exercise.   Review of Systems  Constitutional: Negative for chills, activity change, appetite change, fatigue and unexpected weight change.  HENT: Negative for congestion, mouth sores and sinus pressure.   Eyes: Negative for visual disturbance.  Respiratory: Negative for cough and chest tightness.   Gastrointestinal: Positive for abdominal pain. Negative for nausea.  Genitourinary: Negative for frequency, difficulty urinating and vaginal pain.  Musculoskeletal: Positive for gait problem (R leg pain - knee). Negative for back pain.  Skin: Negative for pallor and rash.  Neurological: Negative for dizziness, tremors, weakness, numbness and headaches.  Psychiatric/Behavioral: Positive for dysphoric mood. Negative for suicidal ideas, confusion and sleep disturbance. The patient is nervous/anxious.        Depressed       Objective:   Physical Exam  Constitutional: She appears well-developed. No distress.       Obese, in NAD  HENT:  Head: Normocephalic.  Right Ear: External ear normal.  Left Ear: External ear normal.  Nose: Nose normal.  Mouth/Throat: Oropharynx is clear and moist.  Eyes: Conjunctivae are normal. Pupils are equal, round, and reactive to light. Right eye exhibits no discharge. Left eye exhibits no discharge.  Neck: Normal range of motion. Neck supple. No JVD present. No tracheal deviation present. No thyromegaly present.  Cardiovascular: Normal rate, regular rhythm and normal heart sounds.   Pulmonary/Chest: No stridor. No respiratory distress. She has no wheezes.  Abdominal: Soft. Bowel sounds are normal. She exhibits no distension and no mass. There is no tenderness. There is no rebound and no guarding.  Musculoskeletal: She  exhibits no edema and no tenderness.  Lymphadenopathy:    She has no cervical adenopathy.  Neurological: She displays normal reflexes. No cranial nerve deficit. She exhibits normal muscle tone. Coordination normal.  Skin: No rash noted. No erythema.  Psychiatric: She has a normal mood and affect. Her behavior is normal. Judgment and thought content normal.          Assessment & Plan:

## 2010-10-20 NOTE — Assessment & Plan Note (Signed)
On Vit D 

## 2010-10-20 NOTE — Assessment & Plan Note (Signed)
On Rx 

## 2010-10-25 NOTE — Progress Notes (Signed)
  Subjective:    Patient ID: [name], female    DOB: 01/02/1930, 77 y.o.   MRN: [id number]  HPI    Review of Systems     Objective:   Physical Exam        Assessment & Plan:

## 2010-10-26 ENCOUNTER — Ambulatory Visit (INDEPENDENT_AMBULATORY_CARE_PROVIDER_SITE_OTHER): Payer: Medicare Other | Admitting: Psychology

## 2010-10-26 DIAGNOSIS — F331 Major depressive disorder, recurrent, moderate: Secondary | ICD-10-CM

## 2010-10-29 ENCOUNTER — Encounter: Payer: Medicare Other | Admitting: *Deleted

## 2010-11-02 ENCOUNTER — Ambulatory Visit: Payer: Medicare Other | Admitting: Psychology

## 2010-11-02 ENCOUNTER — Encounter: Payer: Self-pay | Admitting: *Deleted

## 2010-11-06 ENCOUNTER — Ambulatory Visit (INDEPENDENT_AMBULATORY_CARE_PROVIDER_SITE_OTHER): Payer: Medicare Other | Admitting: *Deleted

## 2010-11-06 ENCOUNTER — Other Ambulatory Visit: Payer: Self-pay | Admitting: Internal Medicine

## 2010-11-06 DIAGNOSIS — I495 Sick sinus syndrome: Secondary | ICD-10-CM

## 2010-11-06 DIAGNOSIS — I4891 Unspecified atrial fibrillation: Secondary | ICD-10-CM

## 2010-11-06 DIAGNOSIS — I472 Ventricular tachycardia: Secondary | ICD-10-CM

## 2010-11-09 ENCOUNTER — Ambulatory Visit (INDEPENDENT_AMBULATORY_CARE_PROVIDER_SITE_OTHER): Payer: Medicare Other | Admitting: Psychology

## 2010-11-09 DIAGNOSIS — F331 Major depressive disorder, recurrent, moderate: Secondary | ICD-10-CM

## 2010-11-09 LAB — REMOTE PACEMAKER DEVICE
AL IMPEDENCE PM: 488 Ohm
AL THRESHOLD: 0.625 v
ATRIAL PACING PM: 83
BAMS-0001: 175 {beats}/min
BATTERY VOLTAGE: 2.79 v
BAVD-0001: 230 ms
BAVD-0002: 260 ms
BMOD-0003: 30
BMOD-0005: 95 {beats}/min
BPAC-0002RA: 2 v
BPAC-0002RV: 2.5 v
BPAC-0003RA: 0.4 ms
BPAC-0003RV: 0.4 ms
BRDY-0002: 70 {beats}/min
BRDY-0003: 120 {beats}/min
BRDY-0004: 120 {beats}/min
BSEN-0002RA: 0.5 mv
BSEN-0002RV: 2.8 mv
BSEN-0004RV: 230 ms
BSEN-0005RA: 180 ms
BSEN-0005RV: 28 ms
DEV-0004LDO: 5076
DEV-0004LDO: 5076
DEV-0006LDO: 20090527
DEV-0006LDO: 20090527
DEV-0006PM: 20090527
DEV-0020PM: NEGATIVE
DEV-0023LDO: 0
DEV-0023LDO: 0
DEV-0023PM: 0
DEV-0024PM: 85
DEV-0025PM: 65
EVAL-0001E5: 20120713225400
EVAL-0003E5: NEGATIVE
EVAL-0015E5: NEGATIVE
RV LEAD IMPEDENCE PM: 513 Ohm
RV LEAD THRESHOLD: 0.5 v
TCAP-0003RA: 0.4 ms
TCAP-0003RV: 0.4 ms
TEL-0014: 0.332 Ohm
VENTRICULAR PACING PM: 3

## 2010-11-16 ENCOUNTER — Ambulatory Visit (INDEPENDENT_AMBULATORY_CARE_PROVIDER_SITE_OTHER): Payer: Medicare Other | Admitting: Psychology

## 2010-11-16 DIAGNOSIS — F331 Major depressive disorder, recurrent, moderate: Secondary | ICD-10-CM

## 2010-11-18 ENCOUNTER — Encounter: Payer: Self-pay | Admitting: *Deleted

## 2010-11-18 NOTE — Progress Notes (Signed)
Pacer remote check  

## 2010-11-23 ENCOUNTER — Ambulatory Visit: Payer: Medicare Other | Admitting: Psychology

## 2010-11-30 ENCOUNTER — Ambulatory Visit: Payer: Medicare Other | Admitting: Psychology

## 2010-12-02 ENCOUNTER — Encounter: Payer: Self-pay | Admitting: Internal Medicine

## 2010-12-02 ENCOUNTER — Ambulatory Visit (INDEPENDENT_AMBULATORY_CARE_PROVIDER_SITE_OTHER): Payer: Medicare Other | Admitting: Internal Medicine

## 2010-12-02 VITALS — BP 148/90 | HR 75 | Temp 97.2°F | Ht 63.0 in | Wt 171.2 lb

## 2010-12-02 DIAGNOSIS — H6120 Impacted cerumen, unspecified ear: Secondary | ICD-10-CM

## 2010-12-02 NOTE — Progress Notes (Signed)
  Subjective:    Patient ID: [name], female    DOB: 15-Jul-1929, 77 y.o.   MRN: [id number]  HPI  complains of right ear "blocked" Onset 2 weeks ago, gradual associated with decrease in hearing even with hearing aide Hx cerumen impaction causing same  PMH reviewed  Review of Systems  HENT: Negative for ear pain and tinnitus.   Neurological: Negative for dizziness and headaches.       Objective:   Physical Exam BP 148/90  Pulse 75  Temp(Src) 97.2 F (36.2 C) (Oral)  Ht 5\' 3"  (1.6 m)  Wt 171 lb 4 oz (77.678 kg)  BMI 30.34 kg/m2  SpO2 98%  Constitutional: She is oriented to person, place, and time. She appears well-developed and well-nourished. No distress.  HENT: Head: Normocephalic and atraumatic. Ears; L TM ok; R TM initially obscured with cerumen; after irrigation/disimpaction, TM clear, no erythema or effusion; Nose: Nose normal.  Mouth/Throat: Oropharynx is clear and moist. No oropharyngeal exudate.  Eyes: Conjunctivae and EOM are normal. Pupils are equal, round, and reactive to light. No scleral icterus.  Neurological: She is alert and oriented to person, place, and time. No cranial nerve deficit. Coordination normal.  Psychiatric: She has a normal mood and affect. Her behavior is normal. Judgment and thought content normal.   Procedure: wax removal Reason: wax impaction Risks and benefits of procedure discussed with the patient who agrees to proceed. Ear(s) irrigated with warm water. Large amount of wax removed. Instrumentation with metal ear loop was performed to accomplish wax removal. the patient tolerated procedure well.    Assessment & Plan:  R ear cerumen impaction - status post removal as noted today - hearing improved - follow up audiology with hearting aide if continued or recurrent issues

## 2010-12-02 NOTE — Patient Instructions (Signed)
It was good to see you today. Your ear(s) have been washed today for wax removal - If any hearing problems, pain or other issues with your ears, please let us know

## 2010-12-07 ENCOUNTER — Ambulatory Visit (INDEPENDENT_AMBULATORY_CARE_PROVIDER_SITE_OTHER): Payer: Medicare Other | Admitting: Psychology

## 2010-12-07 DIAGNOSIS — F331 Major depressive disorder, recurrent, moderate: Secondary | ICD-10-CM

## 2010-12-14 ENCOUNTER — Ambulatory Visit (INDEPENDENT_AMBULATORY_CARE_PROVIDER_SITE_OTHER): Payer: Medicare Other | Admitting: Psychology

## 2010-12-14 ENCOUNTER — Encounter (INDEPENDENT_AMBULATORY_CARE_PROVIDER_SITE_OTHER): Payer: Medicare Other | Admitting: Psychiatry

## 2010-12-14 DIAGNOSIS — F331 Major depressive disorder, recurrent, moderate: Secondary | ICD-10-CM

## 2010-12-14 DIAGNOSIS — F3289 Other specified depressive episodes: Secondary | ICD-10-CM

## 2010-12-14 DIAGNOSIS — F329 Major depressive disorder, single episode, unspecified: Secondary | ICD-10-CM

## 2010-12-18 ENCOUNTER — Ambulatory Visit (INDEPENDENT_AMBULATORY_CARE_PROVIDER_SITE_OTHER): Payer: Medicare Other | Admitting: Internal Medicine

## 2010-12-18 ENCOUNTER — Encounter: Payer: Self-pay | Admitting: Internal Medicine

## 2010-12-18 VITALS — BP 122/82 | HR 77 | Temp 98.6°F | Ht 63.0 in

## 2010-12-18 DIAGNOSIS — H6980 Other specified disorders of Eustachian tube, unspecified ear: Secondary | ICD-10-CM

## 2010-12-18 DIAGNOSIS — H65119 Acute and subacute allergic otitis media (mucoid) (sanguinous) (serous), unspecified ear: Secondary | ICD-10-CM

## 2010-12-18 DIAGNOSIS — J309 Allergic rhinitis, unspecified: Secondary | ICD-10-CM

## 2010-12-18 MED ORDER — FLUTICASONE PROPIONATE 50 MCG/ACT NA SUSP: 2.0000 | Freq: Every day | NASAL | Status: DC

## 2010-12-18 NOTE — Progress Notes (Signed)
  Subjective:    Patient ID: [name], female    DOB: 12-20-1929, 77 y.o.   MRN: [id number]  HPI  complains of right ear pain - Pain is intermittent and mild-moderate (4/10) when present, but increasing frequency and severity Different than prior "blocked" feeling earlier this month (see 8/8 OV) Onset 2 days ago Describes as "ache" and "full" +popping but no hearing change No drainage, fever or headache associated with increased sinus and allergy symptoms x 1 week  Past Medical History  Diagnosis Date  . Thyroid disease   . Glaucoma   . IBS (irritable bowel syndrome)   . Incontinence   . GERD (gastroesophageal reflux disease)   . Hypertension   . Anxiety   . Depression     Dr Dellia Cloud  . HX: breast cancer   . Osteoarthritis   . Osteopenia   . Bradycardia   . IBS (irritable bowel syndrome)   . Discoid lupus     skin  . Pacemaker     Brady//Chronotropic incompetence with normal pacemaker function    Review of Systems  Constitutional: Negative for fever.  HENT: Negative for facial swelling, neck pain and tinnitus.   Neurological: Negative for dizziness and headaches.       Objective:   Physical Exam BP 122/82  Pulse 77  Temp(Src) 98.6 F (37 C) (Oral)  Ht 5\' 3"  (1.6 m)  SpO2 96% Constitutional: She is oriented to person, place, and time. She appears well-developed and well-nourished. No distress.  HENT: Head: Normocephalic and atraumatic. Ears: L TMs ok, no erythema or effusion; R TM retracted with clear effusion and "air bubbles" - no erythema; Nose: Nose normal.  Mouth/Throat: Oropharynx is clear and moist. No oropharyngeal exudate. +clear PND Eyes: Conjunctivae and EOM are normal. Pupils are equal, round, and reactive to light. No scleral icterus.  Neck: Normal range of motion. Neck supple. No JVD present. No thyromegaly present.  Cardiovascular: Normal rate, regular rhythm and normal heart sounds.  No murmur heard. No BLE edema. Pulmonary/Chest: Effort  normal and breath sounds normal. No respiratory distress. She has no wheezes.       Assessment & Plan:  Serous OM - no evidence for inflammation/infection - tx with decongestants and underlying allergic rhinitis with nasal steroids - erx done Pt will call if worse or unimproved  Allg Rhinits - zyrtec, add flonase and use decongest prn  ETD - consider systemic steroids for inflammation or rhinosinusitis if progressive symptoms

## 2010-12-18 NOTE — Patient Instructions (Signed)
It was good to see you today. The discomfort in your ear is from fluid and allergy symptoms- no wax and no infection Use Zyrtec as discussed and nose spray for allergies as discussed - Your prescription(s) have been submitted to your pharmacy. Please take as directed and contact our office if you believe you are having problem(s) with the medication(s).

## 2010-12-21 ENCOUNTER — Ambulatory Visit (INDEPENDENT_AMBULATORY_CARE_PROVIDER_SITE_OTHER): Payer: Medicare Other | Admitting: Psychology

## 2010-12-21 DIAGNOSIS — F331 Major depressive disorder, recurrent, moderate: Secondary | ICD-10-CM

## 2011-01-04 ENCOUNTER — Ambulatory Visit (INDEPENDENT_AMBULATORY_CARE_PROVIDER_SITE_OTHER): Payer: Medicare Other | Admitting: Psychology

## 2011-01-04 DIAGNOSIS — F331 Major depressive disorder, recurrent, moderate: Secondary | ICD-10-CM

## 2011-01-18 ENCOUNTER — Ambulatory Visit (INDEPENDENT_AMBULATORY_CARE_PROVIDER_SITE_OTHER): Payer: Medicare Other | Admitting: Psychology

## 2011-01-18 DIAGNOSIS — F331 Major depressive disorder, recurrent, moderate: Secondary | ICD-10-CM

## 2011-01-19 ENCOUNTER — Other Ambulatory Visit (INDEPENDENT_AMBULATORY_CARE_PROVIDER_SITE_OTHER): Payer: Medicare Other

## 2011-01-19 DIAGNOSIS — I4891 Unspecified atrial fibrillation: Secondary | ICD-10-CM

## 2011-01-19 DIAGNOSIS — E039 Hypothyroidism, unspecified: Secondary | ICD-10-CM

## 2011-01-19 DIAGNOSIS — I10 Essential (primary) hypertension: Secondary | ICD-10-CM

## 2011-01-19 LAB — COMPREHENSIVE METABOLIC PANEL
Alkaline Phosphatase: 88 U/L (ref 39–117)
Creatinine, Ser: 0.9 mg/dL (ref 0.4–1.2)
Glucose, Bld: 108 mg/dL — ABNORMAL HIGH (ref 70–99)
Sodium: 142 mEq/L (ref 135–145)
Total Bilirubin: 0.9 mg/dL (ref 0.3–1.2)
Total Protein: 6.9 g/dL (ref 6.0–8.3)

## 2011-01-19 LAB — TSH: TSH: 1.68 u[IU]/mL (ref 0.35–5.50)

## 2011-01-20 LAB — COMPREHENSIVE METABOLIC PANEL
ALT: 14
ALT: 17
AST: 23
Albumin: 3.3 — ABNORMAL LOW
Alkaline Phosphatase: 74
BUN: 23
Calcium: 9
Calcium: 9.1
Creatinine, Ser: 1.06
GFR calc Af Amer: >60
GFR calc non Af Amer: 51 — ABNORMAL LOW
Glucose, Bld: 142 — ABNORMAL HIGH
Potassium: 3.3 — ABNORMAL LOW
Sodium: 137
Sodium: 143
Total Protein: 6.8
Total Protein: 6.9

## 2011-01-20 LAB — DIFFERENTIAL
Lymphocytes Relative: 11 — ABNORMAL LOW
Lymphs Abs: 1
Monocytes Relative: 6
Neutro Abs: 7.6
Neutrophils Relative %: 83 — ABNORMAL HIGH

## 2011-01-20 LAB — BASIC METABOLIC PANEL
BUN: 20
CO2: 24
Chloride: 106
GFR calc non Af Amer: 55 — ABNORMAL LOW
Glucose, Bld: 110 — ABNORMAL HIGH
Potassium: 3.7
Sodium: 137

## 2011-01-20 LAB — POCT I-STAT, CHEM 8
BUN: 26 — ABNORMAL HIGH
Chloride: 103
HCT: 42
Potassium: 3.7
Sodium: 139

## 2011-01-20 LAB — TROPONIN I: Troponin I: 0.01

## 2011-01-20 LAB — URINALYSIS, ROUTINE W REFLEX MICROSCOPIC
Glucose, UA: NEGATIVE
Ketones, ur: NEGATIVE
Nitrite: NEGATIVE
Protein, ur: NEGATIVE
pH: 8.5 — ABNORMAL HIGH

## 2011-01-20 LAB — URINE MICROSCOPIC-ADD ON

## 2011-01-20 LAB — CARDIAC PANEL(CRET KIN+CKTOT+MB+TROPI)
CK, MB: 1.4
Relative Index: 1.3
Total CK: 129
Troponin I: 0.01

## 2011-01-20 LAB — HEPATIC FUNCTION PANEL
ALT: 16
AST: 24
Bilirubin, Direct: 0.2
Indirect Bilirubin: 0.8
Total Bilirubin: 1

## 2011-01-20 LAB — AMYLASE: Amylase: 162 — ABNORMAL HIGH

## 2011-01-20 LAB — T3, FREE: T3, Free: 2.3 (ref 2.3–4.2)

## 2011-01-20 LAB — TSH: TSH: 2.013

## 2011-01-20 LAB — CK TOTAL AND CKMB (NOT AT ARMC)
CK, MB: 1.5
Total CK: 150

## 2011-01-20 LAB — CBC
MCHC: 34.1
MCHC: 34.3
Platelets: 213
Platelets: 223
RBC: 4.14
RDW: 14.3

## 2011-01-20 LAB — LIPASE, BLOOD: Lipase: 22

## 2011-01-25 ENCOUNTER — Ambulatory Visit (INDEPENDENT_AMBULATORY_CARE_PROVIDER_SITE_OTHER): Payer: Medicare Other | Admitting: Psychology

## 2011-01-25 ENCOUNTER — Ambulatory Visit (INDEPENDENT_AMBULATORY_CARE_PROVIDER_SITE_OTHER): Payer: Medicare Other | Admitting: Internal Medicine

## 2011-01-25 ENCOUNTER — Encounter: Payer: Self-pay | Admitting: Internal Medicine

## 2011-01-25 DIAGNOSIS — E039 Hypothyroidism, unspecified: Secondary | ICD-10-CM

## 2011-01-25 DIAGNOSIS — F331 Major depressive disorder, recurrent, moderate: Secondary | ICD-10-CM

## 2011-01-25 DIAGNOSIS — Z853 Personal history of malignant neoplasm of breast: Secondary | ICD-10-CM

## 2011-01-25 DIAGNOSIS — I10 Essential (primary) hypertension: Secondary | ICD-10-CM

## 2011-01-25 DIAGNOSIS — R7309 Other abnormal glucose: Secondary | ICD-10-CM

## 2011-01-25 DIAGNOSIS — F411 Generalized anxiety disorder: Secondary | ICD-10-CM

## 2011-01-25 NOTE — Progress Notes (Signed)
  Subjective:    Patient ID: [name], female    DOB: 1930/01/25, 76 y.o.   MRN: [id number]  HPI  The patient presents for a follow-up of  chronic hypertension, chronic IBS, GERD controlled with medicines    Review of Systems  Constitutional: Negative for chills, activity change, appetite change, fatigue and unexpected weight change.  HENT: Negative for congestion, mouth sores and sinus pressure.   Eyes: Negative for visual disturbance.  Respiratory: Negative for cough and chest tightness.   Gastrointestinal: Negative for nausea and abdominal pain.  Genitourinary: Negative for frequency, difficulty urinating and vaginal pain.  Musculoskeletal: Negative for back pain and gait problem.  Skin: Negative for pallor and rash.  Neurological: Negative for dizziness, tremors, weakness, numbness and headaches.  Psychiatric/Behavioral: Negative for confusion and sleep disturbance.   BP Readings from Last 3 Encounters:  01/25/11 160/90  12/18/10 122/82  12/02/10 148/90      Objective:   Physical Exam  Constitutional: She appears well-developed. No distress.       Obese   HENT:  Head: Normocephalic.  Right Ear: External ear normal.  Left Ear: External ear normal.  Nose: Nose normal.  Mouth/Throat: Oropharynx is clear and moist.  Eyes: Conjunctivae are normal. Pupils are equal, round, and reactive to light. Right eye exhibits no discharge. Left eye exhibits no discharge.  Neck: Normal range of motion. Neck supple. No JVD present. No tracheal deviation present. No thyromegaly present.  Cardiovascular: Normal rate, regular rhythm and normal heart sounds.   Pulmonary/Chest: No stridor. No respiratory distress. She has no wheezes.  Abdominal: Soft. Bowel sounds are normal. She exhibits no distension and no mass. There is no tenderness. There is no rebound and no guarding.  Musculoskeletal: She exhibits no edema and no tenderness.  Lymphadenopathy:    She has no cervical adenopathy.    Neurological: She displays normal reflexes. No cranial nerve deficit. She exhibits normal muscle tone. Coordination normal.  Skin: No rash noted. No erythema.  Psychiatric: She has a normal mood and affect. Her behavior is normal. Judgment and thought content normal.    Lab Results  Component Value Date   WBC 4.7 05/11/2010   HGB 15.3* 07/30/2010   HCT 45.0 07/30/2010   PLT 222.0 05/11/2010   CHOL 174 05/11/2010   TRIG 78.0 05/11/2010   HDL 52.30 05/11/2010   LDLDIRECT 130.8 09/30/2006   ALT 16 01/19/2011   AST 18 01/19/2011   NA 142 01/19/2011   K 3.8 01/19/2011   CL 106 01/19/2011   CREATININE 0.9 01/19/2011   BUN 17 01/19/2011   CO2 31 01/19/2011   TSH 1.68 01/19/2011   INR 1.00 12/13/2009   HGBA1C 5.9 01/29/2010         Assessment & Plan:

## 2011-01-25 NOTE — Assessment & Plan Note (Signed)
Continue with current prescription therapy as reflected on the Med list.  

## 2011-01-25 NOTE — Assessment & Plan Note (Signed)
Doing well 

## 2011-01-25 NOTE — Assessment & Plan Note (Signed)
Continue with current prescription therapy as reflected on the Med list. States BP is ok at home

## 2011-02-11 ENCOUNTER — Ambulatory Visit (INDEPENDENT_AMBULATORY_CARE_PROVIDER_SITE_OTHER): Payer: Medicare Other | Admitting: *Deleted

## 2011-02-11 DIAGNOSIS — I4891 Unspecified atrial fibrillation: Secondary | ICD-10-CM

## 2011-02-11 DIAGNOSIS — I495 Sick sinus syndrome: Secondary | ICD-10-CM

## 2011-02-11 DIAGNOSIS — I472 Ventricular tachycardia: Secondary | ICD-10-CM

## 2011-02-12 ENCOUNTER — Encounter: Payer: Self-pay | Admitting: Internal Medicine

## 2011-02-15 ENCOUNTER — Ambulatory Visit (INDEPENDENT_AMBULATORY_CARE_PROVIDER_SITE_OTHER): Payer: Medicare Other | Admitting: Psychology

## 2011-02-15 DIAGNOSIS — F331 Major depressive disorder, recurrent, moderate: Secondary | ICD-10-CM

## 2011-02-21 LAB — REMOTE PACEMAKER DEVICE
BAMS-0001: 175 {beats}/min
RV LEAD AMPLITUDE: 16 mv
VENTRICULAR PACING PM: 3

## 2011-02-22 ENCOUNTER — Ambulatory Visit (INDEPENDENT_AMBULATORY_CARE_PROVIDER_SITE_OTHER): Payer: Medicare Other | Admitting: Psychology

## 2011-02-22 DIAGNOSIS — F331 Major depressive disorder, recurrent, moderate: Secondary | ICD-10-CM

## 2011-02-25 NOTE — Progress Notes (Signed)
Pacer remote check  

## 2011-03-01 ENCOUNTER — Ambulatory Visit (INDEPENDENT_AMBULATORY_CARE_PROVIDER_SITE_OTHER): Payer: Medicare Other | Admitting: Psychology

## 2011-03-01 DIAGNOSIS — F331 Major depressive disorder, recurrent, moderate: Secondary | ICD-10-CM

## 2011-03-02 ENCOUNTER — Telehealth: Payer: Self-pay | Admitting: *Deleted

## 2011-03-02 DIAGNOSIS — M8580 Other specified disorders of bone density and structure, unspecified site: Secondary | ICD-10-CM

## 2011-03-02 NOTE — Telephone Encounter (Signed)
OK - done Thx 

## 2011-03-02 NOTE — Telephone Encounter (Signed)
Pt left message requesting referral to [facility] for her bone density that she states is due now.

## 2011-03-04 ENCOUNTER — Telehealth: Payer: Self-pay | Admitting: Internal Medicine

## 2011-03-04 NOTE — Telephone Encounter (Signed)
Spoke w/DOD Dr. Elease Hashimoto. He states that BP is not managed by Dr. Graciela Husbands. He suggests that she call Dr. Genella Mech. She will call them.

## 2011-03-04 NOTE — Telephone Encounter (Signed)
Pt said she has been having gas pains in her upper back, BP 166/110 yesterday, this morning 145/101 and she has been having irregular heartbeats

## 2011-03-04 NOTE — Telephone Encounter (Signed)
Patient states since yesterday her B/P  Has been high even after taken Pindolol 5 mg two tablets also C/O of headache and pain at the pacer site on back,  it could be gas pain or arthritis but she does not want to take a chance,  the last tablet taken yesterday was a 5 or 6 PM this AM her B/P  was 166/110. Today she has taken 2/ 5 mg of  Pindolol about at 10:: AM B/P now is 154/107.she continue to have headache and the same pain on back.

## 2011-03-08 ENCOUNTER — Ambulatory Visit (INDEPENDENT_AMBULATORY_CARE_PROVIDER_SITE_OTHER): Payer: Medicare Other | Admitting: Psychology

## 2011-03-08 DIAGNOSIS — F331 Major depressive disorder, recurrent, moderate: Secondary | ICD-10-CM

## 2011-03-12 ENCOUNTER — Other Ambulatory Visit (HOSPITAL_COMMUNITY): Payer: Self-pay

## 2011-03-15 ENCOUNTER — Ambulatory Visit: Payer: Medicare Other | Admitting: Psychology

## 2011-03-15 ENCOUNTER — Ambulatory Visit (HOSPITAL_COMMUNITY): Payer: Medicare Other | Admitting: Psychiatry

## 2011-03-16 ENCOUNTER — Encounter: Payer: Self-pay | Admitting: *Deleted

## 2011-03-16 ENCOUNTER — Other Ambulatory Visit: Payer: Self-pay | Admitting: Internal Medicine

## 2011-03-22 ENCOUNTER — Ambulatory Visit (INDEPENDENT_AMBULATORY_CARE_PROVIDER_SITE_OTHER): Payer: Medicare Other | Admitting: Psychology

## 2011-03-22 DIAGNOSIS — F331 Major depressive disorder, recurrent, moderate: Secondary | ICD-10-CM

## 2011-03-25 ENCOUNTER — Encounter: Payer: Self-pay | Admitting: Internal Medicine

## 2011-03-29 ENCOUNTER — Ambulatory Visit (INDEPENDENT_AMBULATORY_CARE_PROVIDER_SITE_OTHER): Payer: Medicare Other | Admitting: Psychology

## 2011-03-29 DIAGNOSIS — F331 Major depressive disorder, recurrent, moderate: Secondary | ICD-10-CM

## 2011-04-02 ENCOUNTER — Other Ambulatory Visit (HOSPITAL_COMMUNITY): Payer: Self-pay | Admitting: Psychiatry

## 2011-04-05 ENCOUNTER — Ambulatory Visit (INDEPENDENT_AMBULATORY_CARE_PROVIDER_SITE_OTHER): Payer: Medicare Other | Admitting: Psychology

## 2011-04-05 DIAGNOSIS — F331 Major depressive disorder, recurrent, moderate: Secondary | ICD-10-CM

## 2011-04-12 ENCOUNTER — Ambulatory Visit: Payer: Medicare Other | Admitting: Psychology

## 2011-04-13 ENCOUNTER — Ambulatory Visit (INDEPENDENT_AMBULATORY_CARE_PROVIDER_SITE_OTHER): Payer: Medicare Other | Admitting: Internal Medicine

## 2011-04-13 ENCOUNTER — Telehealth: Payer: Self-pay | Admitting: *Deleted

## 2011-04-13 ENCOUNTER — Ambulatory Visit (INDEPENDENT_AMBULATORY_CARE_PROVIDER_SITE_OTHER)
Admission: RE | Admit: 2011-04-13 | Discharge: 2011-04-13 | Disposition: A | Discharge status: Home / Self Care | Payer: Medicare Other | Source: Ambulatory Visit | Attending: Internal Medicine | Admitting: Internal Medicine

## 2011-04-13 ENCOUNTER — Other Ambulatory Visit (INDEPENDENT_AMBULATORY_CARE_PROVIDER_SITE_OTHER): Payer: Medicare Other

## 2011-04-13 ENCOUNTER — Encounter: Payer: Self-pay | Admitting: Internal Medicine

## 2011-04-13 VITALS — BP 142/80 | HR 84 | Temp 98.2°F | Ht 64.0 in | Wt 175.5 lb

## 2011-04-13 DIAGNOSIS — R0989 Other specified symptoms and signs involving the circulatory and respiratory systems: Secondary | ICD-10-CM

## 2011-04-13 DIAGNOSIS — R0600 Dyspnea, unspecified: Secondary | ICD-10-CM

## 2011-04-13 DIAGNOSIS — R5383 Other fatigue: Secondary | ICD-10-CM | POA: Insufficient documentation

## 2011-04-13 DIAGNOSIS — R0609 Other forms of dyspnea: Secondary | ICD-10-CM

## 2011-04-13 DIAGNOSIS — I10 Essential (primary) hypertension: Secondary | ICD-10-CM

## 2011-04-13 DIAGNOSIS — R5381 Other malaise: Secondary | ICD-10-CM

## 2011-04-13 LAB — HEPATIC FUNCTION PANEL
AST: 22 U/L (ref 0–37)
Bilirubin, Direct: 0.1 mg/dL (ref 0.0–0.3)
Total Bilirubin: 0.7 mg/dL (ref 0.3–1.2)

## 2011-04-13 LAB — CBC WITH DIFFERENTIAL/PLATELET
Basophils Relative: 0.4 % (ref 0.0–3.0)
Eosinophils Absolute: 0.1 10*3/uL (ref 0.0–0.7)
Eosinophils Relative: 1.9 % (ref 0.0–5.0)
HCT: 42.7 % (ref 36.0–46.0)
Lymphs Abs: 1.3 10*3/uL (ref 0.7–4.0)
MCHC: 33.7 g/dL (ref 30.0–36.0)
MCV: 98.8 fl (ref 78.0–100.0)
Monocytes Absolute: 0.4 10*3/uL (ref 0.1–1.0)
Platelets: 220 10*3/uL (ref 150.0–400.0)
WBC: 4.4 10*3/uL — ABNORMAL LOW (ref 4.5–10.5)

## 2011-04-13 LAB — BASIC METABOLIC PANEL
BUN: 22 mg/dL (ref 6–23)
Chloride: 102 mEq/L (ref 96–112)
GFR: 66.44 mL/min (ref >60.00)
Potassium: 4.2 mEq/L (ref 3.5–5.1)
Sodium: 140 mEq/L (ref 135–145)

## 2011-04-13 LAB — TSH: TSH: 2.62 u[IU]/mL (ref 0.35–5.50)

## 2011-04-13 LAB — URINALYSIS, ROUTINE W REFLEX MICROSCOPIC
Bilirubin Urine: NEGATIVE
Hgb urine dipstick: NEGATIVE
Ketones, ur: NEGATIVE
Specific Gravity, Urine: 1.015 (ref 1.000–1.030)
Urine Glucose: NEGATIVE
Urobilinogen, UA: 0.2 (ref 0.0–1.0)

## 2011-04-13 NOTE — Assessment & Plan Note (Addendum)
ECG reviewed as per emr, exam benign, symptoms mild, for labs/cxr today, as well as cxr;  Also for echo and to f/u with Dr Klein/card as planned soon

## 2011-04-13 NOTE — Telephone Encounter (Signed)
Pt request results of Bone Density Test.

## 2011-04-13 NOTE — Patient Instructions (Signed)
Your EKG was ok today Please go to XRAY in the Basement for the x-ray test Please go to LAB in the Basement for the blood and/or urine tests to be done today Please call the phone number [phone] (the [employer] System) for results of testing in 2-3 days;  When calling, simply dial the number, and when prompted enter the MRN number above (the Medical Record Number) and the # key, then the message should start. You will be contacted regarding the referral for: echocardiogram Please keep your appointments with your specialists as you have planned - Dr Graciela Husbands next month

## 2011-04-14 ENCOUNTER — Other Ambulatory Visit: Payer: Medicare Other

## 2011-04-14 ENCOUNTER — Telehealth: Payer: Self-pay | Admitting: Internal Medicine

## 2011-04-14 DIAGNOSIS — R0600 Dyspnea, unspecified: Secondary | ICD-10-CM

## 2011-04-14 DIAGNOSIS — R7989 Other specified abnormal findings of blood chemistry: Secondary | ICD-10-CM

## 2011-04-14 LAB — D-DIMER, QUANTITATIVE: D-Dimer, Quant: 0.75 ug/mL-FEU — ABNORMAL HIGH (ref 0.00–0.48)

## 2011-04-14 NOTE — Telephone Encounter (Signed)
Note test to be done asap

## 2011-04-15 ENCOUNTER — Other Ambulatory Visit: Payer: Medicare Other

## 2011-04-15 NOTE — Telephone Encounter (Signed)
BDS is better than before Thx

## 2011-04-15 NOTE — Telephone Encounter (Signed)
Pt.notified

## 2011-04-16 ENCOUNTER — Ambulatory Visit (INDEPENDENT_AMBULATORY_CARE_PROVIDER_SITE_OTHER)
Admission: RE | Admit: 2011-04-16 | Discharge: 2011-04-16 | Disposition: A | Discharge status: Home / Self Care | Payer: Medicare Other | Source: Ambulatory Visit | Attending: Internal Medicine | Admitting: Internal Medicine

## 2011-04-16 ENCOUNTER — Other Ambulatory Visit: Payer: Self-pay | Admitting: Internal Medicine

## 2011-04-16 DIAGNOSIS — R0989 Other specified symptoms and signs involving the circulatory and respiratory systems: Secondary | ICD-10-CM

## 2011-04-16 DIAGNOSIS — R0600 Dyspnea, unspecified: Secondary | ICD-10-CM

## 2011-04-16 DIAGNOSIS — R7989 Other specified abnormal findings of blood chemistry: Secondary | ICD-10-CM

## 2011-04-16 DIAGNOSIS — R0609 Other forms of dyspnea: Secondary | ICD-10-CM

## 2011-04-16 DIAGNOSIS — R791 Abnormal coagulation profile: Secondary | ICD-10-CM

## 2011-04-16 MED ORDER — IOHEXOL 300 MG/ML  SOLN
100.0000 mL | Freq: Once | INTRAMUSCULAR | Status: AC | PRN
  Administered 2011-04-16: 100 mL via INTRAVENOUS

## 2011-04-16 MED ORDER — ALBUTEROL SULFATE HFA 108 (90 BASE) MCG/ACT IN AERS: 2.0000 | INHALATION_SPRAY | Freq: Four times a day (QID) | RESPIRATORY_TRACT | Status: DC | PRN

## 2011-04-18 ENCOUNTER — Other Ambulatory Visit: Payer: Self-pay | Admitting: Internal Medicine

## 2011-04-18 ENCOUNTER — Encounter: Payer: Self-pay | Admitting: Internal Medicine

## 2011-04-18 NOTE — Assessment & Plan Note (Signed)
stable overall by hx and exam, most recent data reviewed with pt, and pt to continue medical treatment as before  BP Readings from Last 3 Encounters:  04/13/11 142/80  01/25/11 160/90  12/18/10 122/82

## 2011-04-18 NOTE — Assessment & Plan Note (Signed)
Etiology unclear, Exam otherwise benign, to check labs as documented, follow with expectant management  

## 2011-04-18 NOTE — Progress Notes (Signed)
Subjective:    Patient ID: [name], female    DOB: [birth year], 77 y.o.   MRN: [id number]  HPI  Pt of Dr Posey Rea, here as the insistance of her family only,  C/o 4 days onset mild DOE with dizziness/lightheaded/weakness/fatigue at 50 ft, that is unusual for her but little in the way of other symptoms.  Pt denies chest pain, wheezing, orthopnea, PND, increased LE swelling, palpitations, or syncope.  Pt denies new neurological symptoms such as new headache, or facial or extremity weakness or numbness.   Pt denies polydipsia, polyuria.   Pt denies fever, wt loss, night sweats, loss of appetite, or other constitutional symptoms during this time.  Denies worsening depressive symptoms, suicidal ideation, or panic.  No increase orthopedic pain or falls.   Past Medical History  Diagnosis Date  . Thyroid disease   . Glaucoma   . IBS (irritable bowel syndrome)   . Incontinence   . GERD (gastroesophageal reflux disease)   . Hypertension   . Anxiety   . Depression     Dr Dellia Cloud  . HX: breast cancer   . Osteoarthritis   . Osteopenia   . Bradycardia   . IBS (irritable bowel syndrome)   . Discoid lupus     skin  . Pacemaker     Brady//Chronotropic incompetence with normal pacemaker function   Past Surgical History  Procedure Date  . Mastectomy     B  . Pacemaker placement 2009  . Foot surgery     Left    reports that she has never smoked. She does not have any smokeless tobacco history on file. She reports that she does not drink alcohol or use illicit drugs. family history includes Cancer in her brother and mother; Heart disease in her brother and father; and Stroke in her brother and sister. Allergies  Allergen Reactions  . Amlodipine Besy-Benazepril Hcl   . Amlodipine Besylate   . Clonidine Hydrochloride     REACTION: tired  . Valsartan   . Verapamil    Current Outpatient Prescriptions on File Prior to Visit  Medication Sig Dispense Refill  . allopurinol (ZYLOPRIM) 300 MG  tablet Take 300 mg by mouth daily. Take 1/2 daily       . brimonidine (ALPHAGAN P) 0.1 % SOLN 2 (two) times daily.        . Cetirizine HCl (ZYRTEC ALLERGY) 10 MG CAPS Take by mouth.        . Cholecalciferol (VITAMIN D) 1000 UNITS capsule Take 1,000 Units by mouth daily.        [name] escitalopram (LEXAPRO) 20 MG tablet TAKE ONE TABLET BY MOUTH EVERY DAY  30 tablet  0  . furosemide (LASIX) 20 MG tablet Take 1 tablet (20 mg total) by mouth daily.  30 tablet  11  . levothyroxine (SYNTHROID, LEVOTHROID) 100 MCG tablet TAKE ONE TABLET BY MOUTH EVERY DAY  90 tablet  0  . LORazepam (ATIVAN) 0.5 MG tablet TAKE ONE TABLET BY MOUTH EVERY DAY AS NEEDED  30 tablet  0  . Multiple Vitamin (MULTIVITAMIN) tablet Take 1 tablet by mouth daily.        . NON FORMULARY Allergy shots-every other week       . pindolol (VISKEN) 5 MG tablet Take one tablet daily.  2 tablets if BP is elevated  180 tablet  1  . Psyllium (METAMUCIL) 30.9 % POWD Take by mouth.        . RABEprazole (ACIPHEX) 20 MG tablet Take  20 mg by mouth daily.         Review of Systems Review of Systems  Constitutional: Negative for diaphoresis and unexpected weight change.  HENT: Negative for drooling and tinnitus.   Eyes: Negative for photophobia and visual disturbance.  Respiratory: Negative for choking and stridor.   Gastrointestinal: Negative for vomiting and blood in stool.  Genitourinary: Negative for hematuria and decreased urine volume.  Musculoskeletal: Negative for gait problem.  Skin: Negative for color change and wound.  Neurological: Negative for tremors and numbness.  Psychiatric/Behavioral: Negative for decreased concentration. The patient is not hyperactive.       Objective:   Physical Exam BP 142/80  Pulse 84  Temp(Src) 98.2 F (36.8 C) (Oral)  Ht 5\' 4"  (1.626 m)  Wt 175 lb 8 oz (79.606 kg)  BMI 30.12 kg/m2  SpO2 98% Physical Exam  VS noted Constitutional: Pt appears well-developed and well-nourished.  HENT: Head:  Normocephalic.  Right Ear: External ear normal.  Left Ear: External ear normal.  Eyes: Conjunctivae and EOM are normal. Pupils are equal, round, and reactive to light.  Neck: Normal range of motion. Neck supple.  Cardiovascular: Normal rate and regular rhythm.   Pulmonary/Chest: Effort normal and breath sounds normal. no rales or wheezing Abd:  Soft, NT, non-distended, + BS Neurological: Pt is alert. No cranial nerve deficit.  Skin: Skin is warm. No erythema.  Psychiatric: Pt behavior is normal. Thought content normal.     Assessment & Plan:

## 2011-04-19 ENCOUNTER — Inpatient Hospital Stay: Admission: RE | Admit: 2011-04-19 | Payer: Medicare Other | Source: Ambulatory Visit

## 2011-04-19 ENCOUNTER — Ambulatory Visit: Payer: Medicare Other | Admitting: Psychology

## 2011-04-23 ENCOUNTER — Other Ambulatory Visit (HOSPITAL_COMMUNITY): Payer: Medicare Other | Admitting: Radiology

## 2011-04-26 ENCOUNTER — Encounter: Payer: Self-pay | Admitting: Internal Medicine

## 2011-04-26 ENCOUNTER — Ambulatory Visit (HOSPITAL_COMMUNITY): Payer: Medicare Other | Attending: Internal Medicine | Admitting: Radiology

## 2011-04-26 ENCOUNTER — Ambulatory Visit (INDEPENDENT_AMBULATORY_CARE_PROVIDER_SITE_OTHER): Payer: Medicare Other | Admitting: Psychology

## 2011-04-26 ENCOUNTER — Ambulatory Visit (INDEPENDENT_AMBULATORY_CARE_PROVIDER_SITE_OTHER): Payer: Medicare Other | Admitting: Internal Medicine

## 2011-04-26 DIAGNOSIS — F331 Major depressive disorder, recurrent, moderate: Secondary | ICD-10-CM

## 2011-04-26 DIAGNOSIS — R0609 Other forms of dyspnea: Secondary | ICD-10-CM

## 2011-04-26 DIAGNOSIS — R0989 Other specified symptoms and signs involving the circulatory and respiratory systems: Secondary | ICD-10-CM

## 2011-04-26 DIAGNOSIS — R0600 Dyspnea, unspecified: Secondary | ICD-10-CM

## 2011-04-26 DIAGNOSIS — I10 Essential (primary) hypertension: Secondary | ICD-10-CM

## 2011-04-26 DIAGNOSIS — R5381 Other malaise: Secondary | ICD-10-CM

## 2011-04-26 NOTE — Patient Instructions (Signed)
Take Furosemide daily every morning

## 2011-04-26 NOTE — Progress Notes (Signed)
Patient ID: [name], female   DOB: [birth year], 77 y.o.   MRN: [id number]  Subjective:    Patient ID: [name], female    DOB: 1930-01-21, 77 y.o.   MRN: [id number]  HPI    C/o  DOE with dizziness/lightheaded/weakness/fatigue at 50 ft, that is unusual for her but little in the way of other symptoms.  Pt denies chest pain, wheezing, orthopnea, PND, increased LE swelling, palpitations, or syncope.  Pt denies new neurological symptoms such as new headache, or facial or extremity weakness or numbness.   Pt denies polydipsia, polyuria.   Pt denies fever, wt loss, night sweats, loss of appetite, or other constitutional symptoms during this time.  Denies worsening depressive symptoms, suicidal ideation, or panic.  No increase orthopedic pain or falls.  She had an ECHO this am. Not taking Furosemide much....    Past Medical History  Diagnosis Date  . Thyroid disease   . Glaucoma   . IBS (irritable bowel syndrome)   . Incontinence   . GERD (gastroesophageal reflux disease)   . Hypertension   . Anxiety   . Depression     Dr Dellia Cloud  . HX: breast cancer   . Osteoarthritis   . Osteopenia   . Bradycardia   . IBS (irritable bowel syndrome)   . Discoid lupus     skin  . Pacemaker     Brady//Chronotropic incompetence with normal pacemaker function   Past Surgical History  Procedure Date  . Mastectomy     B  . Pacemaker placement 2009  . Foot surgery     Left    reports that she has never smoked. She does not have any smokeless tobacco history on file. She reports that she does not drink alcohol or use illicit drugs. family history includes Cancer in her brother and mother; Heart disease in her brother and father; and Stroke in her brother and sister. Allergies  Allergen Reactions  . Amlodipine Besy-Benazepril Hcl   . Amlodipine Besylate   . Clonidine Hydrochloride     REACTION: tired  . Valsartan   . Verapamil    Current Outpatient Prescriptions on File Prior to Visit    Medication Sig Dispense Refill  . albuterol (PROVENTIL HFA;VENTOLIN HFA) 108 (90 BASE) MCG/ACT inhaler Inhale 2 puffs into the lungs every 6 (six) hours as needed for wheezing.  1 Inhaler  5  . allopurinol (ZYLOPRIM) 300 MG tablet Take 300 mg by mouth daily. Take 1/2 daily       . brimonidine (ALPHAGAN P) 0.1 % SOLN 2 (two) times daily.        . Cetirizine HCl (ZYRTEC ALLERGY) 10 MG CAPS Take by mouth.        . Cholecalciferol (VITAMIN D) 1000 UNITS capsule Take 1,000 Units by mouth daily.        [name] escitalopram (LEXAPRO) 20 MG tablet TAKE ONE TABLET BY MOUTH EVERY DAY  30 tablet  0  . furosemide (LASIX) 20 MG tablet Take 1 tablet (20 mg total) by mouth daily.  30 tablet  11  . levothyroxine (SYNTHROID, LEVOTHROID) 100 MCG tablet TAKE ONE TABLET BY MOUTH EVERY DAY  90 tablet  0  . LORazepam (ATIVAN) 0.5 MG tablet TAKE ONE TABLET BY MOUTH EVERY DAY AS NEEDED  30 tablet  0  . Multiple Vitamin (MULTIVITAMIN) tablet Take 1 tablet by mouth daily.        . NON FORMULARY Allergy shots-every other week       .  pindolol (VISKEN) 5 MG tablet TAKE ONE TABLET BY MOUTH EVERY DAY, TAKE TWO TABLETS BY MOUTH IF BLOOD PRESSURE IS ELEVATED  180 tablet  0  . Psyllium (METAMUCIL) 30.9 % POWD Take by mouth.        . RABEprazole (ACIPHEX) 20 MG tablet Take 20 mg by mouth daily.         Review of Systems Review of Systems  Constitutional: Negative for diaphoresis and unexpected weight change.  HENT: Negative for drooling and tinnitus.   Eyes: Negative for photophobia and visual disturbance.  Respiratory: Negative for choking and stridor.   Gastrointestinal: Negative for vomiting and blood in stool.  Genitourinary: Negative for hematuria and decreased urine volume.  Musculoskeletal: Negative for gait problem.  Skin: Negative for color change and wound.  Neurological: Negative for tremors and numbness.  Psychiatric/Behavioral: Negative for decreased concentration. The patient is not hyperactive.        Objective:   Physical Exam BP 132/90  Pulse 80  Temp(Src) 97.4 F (36.3 C) (Oral)  Resp 16  Wt 175 lb (79.379 kg) Physical Exam  VS noted Constitutional: Pt appears well-developed and well-nourished.  HENT: Head: Normocephalic.  Right Ear: External ear normal.  Left Ear: External ear normal.  Eyes: Conjunctivae and EOM are normal. Pupils are equal, round, and reactive to light.  Neck: Normal range of motion. Neck supple.  Cardiovascular: Normal rate and regular rhythm.   Pulmonary/Chest: Effort normal and breath sounds normal. no rales or wheezing Abd:  Soft, NT, non-distended, + BS Neurological: Pt is alert. No cranial nerve deficit.  Skin: Skin is warm. No erythema.  Psychiatric: Pt behavior is normal. Thought content normal.   CT angio - ok Lab Results  Component Value Date   WBC 4.4* 04/13/2011   HGB 14.4 04/13/2011   HCT 42.7 04/13/2011   PLT 220.0 04/13/2011   GLUCOSE 91 04/13/2011   CHOL 174 05/11/2010   TRIG 78.0 05/11/2010   HDL 52.30 05/11/2010   LDLDIRECT 130.8 09/30/2006   LDLCALC 106* 05/11/2010   ALT 15 04/13/2011   AST 22 04/13/2011   NA 140 04/13/2011   K 4.2 04/13/2011   CL 102 04/13/2011   CREATININE 1.0 04/13/2011   BUN 22 04/13/2011   CO2 30 04/13/2011   TSH 2.62 04/13/2011   INR 1.00 12/13/2009   HGBA1C 5.9 01/29/2010       Assessment & Plan:

## 2011-04-26 NOTE — Progress Notes (Deleted)
  Subjective:    Patient ID: [name], female    DOB: [birth year], 77 y.o.   MRN: [id number]  HPI  SOB  Review of Systems     Objective:   Physical Exam        Assessment & Plan:

## 2011-04-26 NOTE — Assessment & Plan Note (Signed)
Chronic  12/12 CT angio was nl ECHO done on 12/31 Take Furosemide daily

## 2011-04-27 ENCOUNTER — Encounter: Payer: Self-pay | Admitting: Internal Medicine

## 2011-04-27 NOTE — Assessment & Plan Note (Signed)
Chronic. 

## 2011-04-27 NOTE — Assessment & Plan Note (Signed)
Continue with current prescription therapy as reflected on the Med list.  

## 2011-05-03 ENCOUNTER — Ambulatory Visit (INDEPENDENT_AMBULATORY_CARE_PROVIDER_SITE_OTHER): Payer: Medicare Other | Admitting: Psychology

## 2011-05-03 DIAGNOSIS — F331 Major depressive disorder, recurrent, moderate: Secondary | ICD-10-CM

## 2011-05-03 DIAGNOSIS — J309 Allergic rhinitis, unspecified: Secondary | ICD-10-CM | POA: Diagnosis not present

## 2011-05-10 ENCOUNTER — Ambulatory Visit (INDEPENDENT_AMBULATORY_CARE_PROVIDER_SITE_OTHER): Payer: Medicare Other | Admitting: Psychiatry

## 2011-05-10 ENCOUNTER — Ambulatory Visit: Payer: Medicare Other | Admitting: Psychology

## 2011-05-10 DIAGNOSIS — F329 Major depressive disorder, single episode, unspecified: Secondary | ICD-10-CM

## 2011-05-10 MED ORDER — ESCITALOPRAM OXALATE 20 MG PO TABS: 20.0000 mg | ORAL_TABLET | Freq: Every day | ORAL | Status: DC

## 2011-05-10 NOTE — Progress Notes (Signed)
Patient came for her followup appointment. She's complaining of tired feeling. She recently have heart issues and now given Lasix to help her breathing. Patient admitted that she has been feeling more tired and weak. I review her medication she is taking a lot of medication for her physical health. Patient also with about her physical health and wondering if Lexapro can be reduced. In the past he has taken Lexapro 10 mg however she relapsed into depression and the dose was increased to 20 mg. She's also taking Ativan 0.5 mg when necessary as needed. She denies any agitation anger or mood swings. She denies any crying spells her social isolation. She has a very quiet Christmas. She continued to endorse family issues and has been seeing therapist regularly.  Mental status examination Patient is casually dressed appears tired. She maintained good eye contact. Her speech is slow and soft. Her thought process is logical linear and goal-directed. She has any active or passive suicidal thoughts or homicidal thoughts. There no psychotic symptoms present. Her attention and concentration is fair. She's alert and oriented x3. Her insight judgment and pulse control is okay.  Assessment Depressive disorder NOS  Plan I have explained patient the risk of decompensation with reducing the Lexapro. I will continue Lexapro 20 mg however patient liked to try half pill for now. I recommended to call us if she started to feel more depressed and anxious with reducing the medication. I will see her again in 2 months.

## 2011-05-17 ENCOUNTER — Ambulatory Visit: Payer: Medicare Other | Admitting: Psychology

## 2011-05-18 ENCOUNTER — Encounter: Payer: Self-pay | Admitting: Internal Medicine

## 2011-05-18 ENCOUNTER — Ambulatory Visit (INDEPENDENT_AMBULATORY_CARE_PROVIDER_SITE_OTHER): Payer: Medicare Other | Admitting: Internal Medicine

## 2011-05-18 DIAGNOSIS — I472 Ventricular tachycardia: Secondary | ICD-10-CM

## 2011-05-18 DIAGNOSIS — I4891 Unspecified atrial fibrillation: Secondary | ICD-10-CM

## 2011-05-18 DIAGNOSIS — R0609 Other forms of dyspnea: Secondary | ICD-10-CM

## 2011-05-18 DIAGNOSIS — I951 Orthostatic hypotension: Secondary | ICD-10-CM

## 2011-05-18 DIAGNOSIS — Z950 Presence of cardiac pacemaker: Secondary | ICD-10-CM | POA: Insufficient documentation

## 2011-05-18 DIAGNOSIS — R0989 Other specified symptoms and signs involving the circulatory and respiratory systems: Secondary | ICD-10-CM

## 2011-05-18 DIAGNOSIS — J309 Allergic rhinitis, unspecified: Secondary | ICD-10-CM | POA: Diagnosis not present

## 2011-05-18 DIAGNOSIS — I495 Sick sinus syndrome: Secondary | ICD-10-CM

## 2011-05-18 DIAGNOSIS — R0600 Dyspnea, unspecified: Secondary | ICD-10-CM

## 2011-05-18 LAB — PACEMAKER DEVICE OBSERVATION
AL AMPLITUDE: 4 mv
BAMS-0001: 175 {beats}/min
RV LEAD AMPLITUDE: 8 mv
RV LEAD THRESHOLD: 0.625 V
VENTRICULAR PACING PM: 2

## 2011-05-18 NOTE — Assessment & Plan Note (Signed)
Detected on her device but I don't think clinically relevant; ejection fraction is normal to risk stratification is not necessary

## 2011-05-18 NOTE — Progress Notes (Signed)
HPI  [name] is a 78 y.o. female  seen in followup for orthostatic intolerance, exercise intolerance, and prior pacemaker implantation for sinus node dysfunction. She is . doing better with regard to dizziness.  She is less depressed. She has chronic shortness of breath and weakness which is stable. There has been no chest pain or edema.  she has developed gout again in her footing on a prednisone ejection yesterday by an orthopedist. No medications were prescribed  Echo 12/12 normal LV fucton, mild MR; aortic stenosis  CTA normal  Saw PCP and rec diuretics daily  To have exercise associated shortness of breath. She also notes that when she exerts relatively heavily like mopping or climbing stairs that she can get lightheadedness and presyncopal.  Past Medical History  Diagnosis Date  . Thyroid disease   . Glaucoma   . IBS (irritable bowel syndrome)   . Incontinence   . GERD (gastroesophageal reflux disease)   . Hypertension   . Anxiety   . Depression     Dr Dellia Cloud  . HX: breast cancer   . Osteoarthritis   . Osteopenia   . Bradycardia   . IBS (irritable bowel syndrome)   . Discoid lupus     skin  . Pacemaker     Brady//Chronotropic incompetence with normal pacemaker function    Past Surgical History  Procedure Date  . Mastectomy     B  . Pacemaker placement 2009  . Foot surgery     Left    Current Outpatient Prescriptions  Medication Sig Dispense Refill  . albuterol (PROVENTIL HFA;VENTOLIN HFA) 108 (90 BASE) MCG/ACT inhaler Inhale 2 puffs into the lungs every 6 (six) hours as needed for wheezing.  1 Inhaler  5  . allopurinol (ZYLOPRIM) 300 MG tablet Take 300 mg by mouth daily. Take 1/2 daily       . brimonidine (ALPHAGAN P) 0.1 % SOLN 2 (two) times daily.        . Cetirizine HCl (ZYRTEC ALLERGY) 10 MG CAPS Take by mouth.        . Cholecalciferol (VITAMIN D) 1000 UNITS capsule Take 1,000 Units by mouth daily.        [name] escitalopram (LEXAPRO) 20 MG tablet Take  10 mg by mouth daily.      . furosemide (LASIX) 20 MG tablet Take 1 tablet (20 mg total) by mouth daily.  30 tablet  11  . levothyroxine (SYNTHROID, LEVOTHROID) 100 MCG tablet TAKE ONE TABLET BY MOUTH EVERY DAY  90 tablet  0  . LORazepam (ATIVAN) 0.5 MG tablet TAKE ONE TABLET BY MOUTH EVERY DAY AS NEEDED  30 tablet  0  . Multiple Vitamin (MULTIVITAMIN) tablet Take 1 tablet by mouth daily.        . NON FORMULARY Allergy shots-every other week       . pindolol (VISKEN) 5 MG tablet TAKE ONE TABLET BY MOUTH EVERY DAY, TAKE TWO TABLETS BY MOUTH IF BLOOD PRESSURE IS ELEVATED  180 tablet  0  . Psyllium (METAMUCIL) 30.9 % POWD Take by mouth.        . RABEprazole (ACIPHEX) 20 MG tablet Take 20 mg by mouth daily.          Allergies  Allergen Reactions  . Amlodipine Besy-Benazepril Hcl   . Amlodipine Besylate   . Clonidine Hydrochloride     REACTION: tired  . Valsartan   . Verapamil     Review of Systems negative except from HPI and PMH  Physical Exam BP 140/96  Pulse 84  Ht 5\' 3"  (1.6 m)  Wt 171 lb (77.565 kg)  BMI 30.29 kg/m2 Well developed and well nourished in no acute distress HENT normal E scleral and icterus clear Neck Supple JVP flat; carotids brisk and full Clear to ausculation Regular rate and rhythm, no murmurs gallops or rub Soft with active bowel sounds No clubbing cyanosis none Edema Alert and oriented, grossly normal motor and sensory function Skin Warm and Dry   Assessment and  Plan

## 2011-05-18 NOTE — Assessment & Plan Note (Signed)
Significant atrial fibrillation detected by her device

## 2011-05-18 NOTE — Assessment & Plan Note (Signed)
Her dyspnea on exertion while chronic is associated with some lightheadedness with exertion. This could be valve related,  obesity related etc. I also wonder what is potentially related to her rate response programming. It may be overzealous. We have increased her threshold today for medium low>>>-medium-high. She'll let us know how she does.

## 2011-05-18 NOTE — Patient Instructions (Signed)
Remote monitoring is used to monitor your Pacemaker of ICD from home. This monitoring reduces the number of office visits required to check your device to one time per year. It allows [place] to keep an eye on the functioning of your device to ensure it is working properly. You are scheduled for a device check from home on 08/19/11. You may send your transmission at any time that day. If you have a wireless device, the transmission will be sent automatically. After your physician reviews your transmission, you will receive a postcard with your next transmission date.  Your physician wants you to follow-up in: 1 year with Dr. Graciela Husbands. You will receive a reminder letter in the mail two months in advance. If you don't receive a letter, please call our office to schedule the follow-up appointment.  Your physician recommends that you continue on your current medications as directed. Please refer to the Current Medication list given to you today.  Call our office in 1-2 weeks and let Dr. Graciela Husbands know how your shortness of breath is doing. If no improvement, he wants to proceed with a stress echo.

## 2011-05-18 NOTE — Assessment & Plan Note (Signed)
She is atrially paced at about 85% of the time. Her lower rate limit is programmed at 70 with her sleep mode on.

## 2011-05-18 NOTE — Assessment & Plan Note (Signed)
The patient's device was interrogated and the information was fully reviewed.  The device was reprogrammed to decrease activity threshold

## 2011-05-18 NOTE — Assessment & Plan Note (Signed)
I don't know whether or orthostasis is responsible for exertional lightheadedness. It might be. The event that it persists, we'll undertake stress echo to look both the blood pressure response as well as whether her mitral regurgitation worsens.

## 2011-05-19 ENCOUNTER — Telehealth: Payer: Self-pay | Admitting: Internal Medicine

## 2011-05-19 NOTE — Telephone Encounter (Signed)
New problem Pt wants to talk to Dr Graciela Husbands about pacemaker- she said she only wanted to speak to Dr Graciela Husbands and wouldn't give details

## 2011-05-19 NOTE — Telephone Encounter (Signed)
Will forward to Dr. Klein. 

## 2011-05-21 NOTE — Telephone Encounter (Signed)
Calling b/c she was depressed.  And was asking that I pray for her.  She mentioned she was going to [facility] behavoioural [facility], but has trouble talking about her spiritual issues there and i mentioned a pay as can womans counseling prograam from a religious perspective.  I will try and get that contact from my colleague

## 2011-05-24 ENCOUNTER — Telehealth: Payer: Self-pay | Admitting: *Deleted

## 2011-05-24 ENCOUNTER — Ambulatory Visit (INDEPENDENT_AMBULATORY_CARE_PROVIDER_SITE_OTHER): Payer: Medicare Other | Admitting: Psychology

## 2011-05-24 DIAGNOSIS — F331 Major depressive disorder, recurrent, moderate: Secondary | ICD-10-CM

## 2011-05-24 DIAGNOSIS — J309 Allergic rhinitis, unspecified: Secondary | ICD-10-CM | POA: Diagnosis not present

## 2011-05-24 NOTE — Telephone Encounter (Signed)
Pt needs to have oral surgery and they need medical clearance from [place]. They faxed over form on 05-12-11. Have you seen this?

## 2011-05-24 NOTE — Telephone Encounter (Signed)
No, I haven't seen it Thx

## 2011-05-24 NOTE — Telephone Encounter (Signed)
Error below

## 2011-05-24 NOTE — Telephone Encounter (Signed)
Pt calling requesting OV today because her thumb is not healing properly. Per Dr. Posey Rea- ok to work pt in tom AM at 7:45 or 8 am. Pt informed to be here tom am.

## 2011-05-25 ENCOUNTER — Telehealth: Payer: Self-pay | Admitting: *Deleted

## 2011-05-25 ENCOUNTER — Encounter: Payer: Self-pay | Admitting: *Deleted

## 2011-05-25 NOTE — Telephone Encounter (Signed)
Faxed our most recent OV note to [facility], DDS attn: Joni Reining to fax number [phone].

## 2011-05-25 NOTE — Telephone Encounter (Signed)
Spoke to [place]- she is refaxing form to be completed sometime today.

## 2011-05-26 ENCOUNTER — Telehealth: Payer: Self-pay | Admitting: Internal Medicine

## 2011-05-26 DIAGNOSIS — R55 Syncope and collapse: Secondary | ICD-10-CM

## 2011-05-26 NOTE — Telephone Encounter (Signed)
New Problem   Patient requesting call from Dr. Graciela Husbands nursing regarding device, she can be reached at hm#

## 2011-05-26 NOTE — Telephone Encounter (Signed)
Patient states has been having symptoms like she is going to passed out when she gets up and when walked about. [place] the mall [place] walking, she felt like she was going to passed out then. She said it is her pacemaker that is given her problems.She  Is also having high blood pressure issus. Last night her B/P was 158/91 she took 2/ 5 mg Pindolol. This morning her B/P was high again, she took another 2/5 mg Pindolol an hour ago. Patient would like for Dr. Odessa Fleming nurse Sherri Rad RN to call her tomorrow, patient would like to discuss this issue with her.

## 2011-05-27 NOTE — Telephone Encounter (Signed)
I spoke with the patient. She reports that she has not had problems with her BP being up until recently. She was 139/92 this morning. She has been taking pindolol 5 mg two tablets BID for her elevated BP. She states that she was walking the day before yesterday when she felt pre-syncopal. She reports that she has had these similar symptoms prior to her PPM being re-programmed last week. Her SOB is not much improved. I have reviewed this with Dr. Graciela Husbands. He states that we should go ahead and order a stress echo. I have explained this to the patient and she is willing to proceed.

## 2011-05-27 NOTE — Telephone Encounter (Signed)
I spoke with the patient and made her aware that Dr. Graciela Husbands gave me the number for the Women's counseling program. I have given her the number for [facility]- [phone].

## 2011-05-31 ENCOUNTER — Ambulatory Visit (INDEPENDENT_AMBULATORY_CARE_PROVIDER_SITE_OTHER): Payer: Medicare Other | Admitting: Internal Medicine

## 2011-05-31 ENCOUNTER — Ambulatory Visit (INDEPENDENT_AMBULATORY_CARE_PROVIDER_SITE_OTHER): Payer: Medicare Other | Admitting: Psychology

## 2011-05-31 ENCOUNTER — Encounter: Payer: Self-pay | Admitting: Internal Medicine

## 2011-05-31 DIAGNOSIS — F331 Major depressive disorder, recurrent, moderate: Secondary | ICD-10-CM

## 2011-05-31 DIAGNOSIS — R0609 Other forms of dyspnea: Secondary | ICD-10-CM | POA: Diagnosis not present

## 2011-05-31 DIAGNOSIS — R55 Syncope and collapse: Secondary | ICD-10-CM | POA: Diagnosis not present

## 2011-05-31 DIAGNOSIS — F329 Major depressive disorder, single episode, unspecified: Secondary | ICD-10-CM | POA: Diagnosis not present

## 2011-05-31 DIAGNOSIS — I10 Essential (primary) hypertension: Secondary | ICD-10-CM

## 2011-05-31 DIAGNOSIS — R0600 Dyspnea, unspecified: Secondary | ICD-10-CM

## 2011-05-31 NOTE — Assessment & Plan Note (Addendum)
Chronic - poss multifactorial 12/12 CT angio was nl ECHO done on 12/31 OK for age  Stress ECHO is pending  Discussed: I advised her to rest more and cut back on her activities

## 2011-05-31 NOTE — Assessment & Plan Note (Signed)
Continue with current prescription therapy as reflected on the Med list.  

## 2011-05-31 NOTE — Progress Notes (Signed)
Patient ID: [name], female   DOB: December 01, 1929, 78 y.o.   MRN: [id number] Patient ID: [name], female   DOB: [birth year], 78 y.o.   MRN: [id number]  Subjective:    Patient ID: [name], female    DOB: 1929/12/25, 78 y.o.   MRN: [id number]  HPI    C/o  DOE with dizziness/lightheaded/weakness/fatigue w/exertion, that is unusual for her but little in the way of other symptoms.  Pt denies chest pain, wheezing, orthopnea, PND, increased LE swelling, palpitations, or syncope.  Pt denies new neurological symptoms such as new headache, or facial or extremity weakness or numbness.   Pt denies polydipsia, polyuria.   Pt denies fever, wt loss, night sweats, loss of appetite, or other constitutional symptoms during this time.  Denies worsening depressive symptoms, suicidal ideation, or panic.  No increase orthopedic pain or falls.  She had an ECHO which was ok. She has been taking Furosemide ....  Wt Readings from Last 3 Encounters:  05/31/11 178 lb (80.74 kg)  05/18/11 171 lb (77.565 kg)  04/26/11 175 lb (79.379 kg)      Past Medical History  Diagnosis Date  . Thyroid disease   . Glaucoma   . IBS (irritable bowel syndrome)   . Incontinence   . GERD (gastroesophageal reflux disease)   . Hypertension   . Anxiety   . Depression     Dr Dellia Cloud  . HX: breast cancer   . Osteoarthritis   . Osteopenia   . Bradycardia   . IBS (irritable bowel syndrome)   . Discoid lupus     skin  . Pacemaker     Brady//Chronotropic incompetence with normal pacemaker function   Past Surgical History  Procedure Date  . Mastectomy     B  . Pacemaker placement 2009  . Foot surgery     Left    reports that she has never smoked. She does not have any smokeless tobacco history on file. She reports that she does not drink alcohol or use illicit drugs. family history includes Cancer in her brother and mother; Heart disease in her brother and father; and Stroke in her brother and sister. Allergies  Allergen  Reactions  . Amlodipine Besy-Benazepril Hcl   . Amlodipine Besylate   . Clonidine Hydrochloride     REACTION: tired  . Valsartan   . Verapamil    Current Outpatient Prescriptions on File Prior to Visit  Medication Sig Dispense Refill  . albuterol (PROVENTIL HFA;VENTOLIN HFA) 108 (90 BASE) MCG/ACT inhaler Inhale 2 puffs into the lungs every 6 (six) hours as needed for wheezing.  1 Inhaler  5  . allopurinol (ZYLOPRIM) 300 MG tablet Take 300 mg by mouth daily. Take 1/2 daily       . brimonidine (ALPHAGAN P) 0.1 % SOLN 2 (two) times daily.        . Cetirizine HCl (ZYRTEC ALLERGY) 10 MG CAPS Take by mouth.        . Cholecalciferol (VITAMIN D) 1000 UNITS capsule Take 1,000 Units by mouth daily.        [name] escitalopram (LEXAPRO) 20 MG tablet Take 10 mg by mouth daily.      . furosemide (LASIX) 20 MG tablet Take 1 tablet (20 mg total) by mouth daily.  30 tablet  11  . levothyroxine (SYNTHROID, LEVOTHROID) 100 MCG tablet TAKE ONE TABLET BY MOUTH EVERY DAY  90 tablet  0  . LORazepam (ATIVAN) 0.5 MG tablet TAKE ONE TABLET BY MOUTH EVERY DAY  AS NEEDED  30 tablet  0  . Multiple Vitamin (MULTIVITAMIN) tablet Take 1 tablet by mouth daily.        . NON FORMULARY Allergy shots-every other week       . pindolol (VISKEN) 5 MG tablet TAKE ONE TABLET BY MOUTH EVERY DAY, TAKE TWO TABLETS BY MOUTH IF BLOOD PRESSURE IS ELEVATED  180 tablet  0  . Psyllium (METAMUCIL) 30.9 % POWD Take by mouth.        . RABEprazole (ACIPHEX) 20 MG tablet Take 20 mg by mouth daily.         Review of Systems Review of Systems  Constitutional: Negative for diaphoresis and unexpected weight change.  HENT: Negative for drooling and tinnitus.   Eyes: Negative for photophobia and visual disturbance.  Respiratory: Negative for choking and stridor.   Gastrointestinal: Negative for vomiting and blood in stool.  Genitourinary: Negative for hematuria and decreased urine volume.  Musculoskeletal: Negative for gait problem.  Skin: Negative  for color change and wound.  Neurological: Negative for tremors and numbness.  Psychiatric/Behavioral: Negative for decreased concentration. The patient is not hyperactive.       Objective:   Physical Exam BP 158/100  Pulse 80  Temp(Src) 97.5 F (36.4 C) (Oral)  Resp 16  Wt 178 lb (80.74 kg) Physical Exam  VS noted Constitutional: Pt appears well-developed and well-nourished.  HENT: Head: Normocephalic.  Right Ear: External ear normal.  Left Ear: External ear normal.  Eyes: Conjunctivae and EOM are normal. Pupils are equal, round, and reactive to light.  Neck: Normal range of motion. Neck supple.  Cardiovascular: Normal rate and regular rhythm.   Pulmonary/Chest: Effort normal and breath sounds normal. no rales or wheezing Abd:  Soft, NT, non-distended, + BS Neurological: Pt is alert. No cranial nerve deficit.  Skin: Skin is warm. No erythema.  Psychiatric: Pt behavior is normal. Thought content normal.   CT angio - ok ECHO - OK for age  Lab Results  Component Value Date   WBC 4.4* 04/13/2011   HGB 14.4 04/13/2011   HCT 42.7 04/13/2011   PLT 220.0 04/13/2011   GLUCOSE 91 04/13/2011   CHOL 174 05/11/2010   TRIG 78.0 05/11/2010   HDL 52.30 05/11/2010   LDLDIRECT 130.8 09/30/2006   LDLCALC 106* 05/11/2010   ALT 15 04/13/2011   AST 22 04/13/2011   NA 140 04/13/2011   K 4.2 04/13/2011   CL 102 04/13/2011   CREATININE 1.0 04/13/2011   BUN 22 04/13/2011   CO2 30 04/13/2011   TSH 2.62 04/13/2011   INR 1.00 12/13/2009   HGBA1C 5.9 01/29/2010       Assessment & Plan:

## 2011-05-31 NOTE — Assessment & Plan Note (Signed)
No recurrence. 

## 2011-06-02 DIAGNOSIS — J309 Allergic rhinitis, unspecified: Secondary | ICD-10-CM | POA: Diagnosis not present

## 2011-06-07 ENCOUNTER — Encounter: Payer: Self-pay | Admitting: Internal Medicine

## 2011-06-08 ENCOUNTER — Ambulatory Visit (HOSPITAL_COMMUNITY): Payer: Medicare Other | Attending: Cardiology | Admitting: Radiology

## 2011-06-08 DIAGNOSIS — Z8249 Family history of ischemic heart disease and other diseases of the circulatory system: Secondary | ICD-10-CM | POA: Diagnosis not present

## 2011-06-08 DIAGNOSIS — I10 Essential (primary) hypertension: Secondary | ICD-10-CM | POA: Insufficient documentation

## 2011-06-08 DIAGNOSIS — R55 Syncope and collapse: Secondary | ICD-10-CM | POA: Diagnosis not present

## 2011-06-08 DIAGNOSIS — E119 Type 2 diabetes mellitus without complications: Secondary | ICD-10-CM | POA: Diagnosis not present

## 2011-06-08 DIAGNOSIS — R42 Dizziness and giddiness: Secondary | ICD-10-CM | POA: Diagnosis not present

## 2011-06-08 DIAGNOSIS — R5381 Other malaise: Secondary | ICD-10-CM | POA: Diagnosis not present

## 2011-06-08 DIAGNOSIS — R002 Palpitations: Secondary | ICD-10-CM | POA: Diagnosis not present

## 2011-06-08 DIAGNOSIS — R0602 Shortness of breath: Secondary | ICD-10-CM | POA: Diagnosis not present

## 2011-06-08 DIAGNOSIS — R0989 Other specified symptoms and signs involving the circulatory and respiratory systems: Secondary | ICD-10-CM | POA: Insufficient documentation

## 2011-06-08 DIAGNOSIS — R0609 Other forms of dyspnea: Secondary | ICD-10-CM | POA: Insufficient documentation

## 2011-06-08 DIAGNOSIS — R000 Tachycardia, unspecified: Secondary | ICD-10-CM | POA: Diagnosis not present

## 2011-06-09 ENCOUNTER — Telehealth: Payer: Self-pay | Admitting: Internal Medicine

## 2011-06-09 NOTE — Telephone Encounter (Signed)
I spoke with the patient and made her aware that her results have not been reviewed by Dr. Graciela Husbands yet. I will call her back once this is done.

## 2011-06-09 NOTE — Telephone Encounter (Signed)
New problem:  Test results.  

## 2011-06-10 ENCOUNTER — Telehealth: Payer: Self-pay | Admitting: Internal Medicine

## 2011-06-10 DIAGNOSIS — J309 Allergic rhinitis, unspecified: Secondary | ICD-10-CM | POA: Diagnosis not present

## 2011-06-10 NOTE — Telephone Encounter (Signed)
Pt has called two days in a row for results, Dr Clifton James reviewed/normal stress results given. Pt continues to have SOB/ bp 154/92 currently, p 91, c/o feeling poorly, SOB that can be heard over phone in her conversation,  c/o abdominal bloat denies weight gain and has regular bowel mvmnts x 2 today.  Saw PCP this week bp was 158/100 p 80, bp was not addressed at visit. Told pt to call PCP now and see what he advises, pt aware Dr Graciela Husbands Burnett Kanaris out of office till Monday.

## 2011-06-10 NOTE — Telephone Encounter (Signed)
New Problem:     Patient called in to receive the results of her Stress Echo she had done on 06/08/11. Please call back.

## 2011-06-11 ENCOUNTER — Other Ambulatory Visit: Payer: Self-pay | Admitting: Internal Medicine

## 2011-06-14 ENCOUNTER — Ambulatory Visit (INDEPENDENT_AMBULATORY_CARE_PROVIDER_SITE_OTHER): Payer: Medicare Other | Admitting: Psychology

## 2011-06-14 DIAGNOSIS — F331 Major depressive disorder, recurrent, moderate: Secondary | ICD-10-CM | POA: Diagnosis not present

## 2011-06-21 ENCOUNTER — Ambulatory Visit (INDEPENDENT_AMBULATORY_CARE_PROVIDER_SITE_OTHER): Payer: Medicare Other | Admitting: Psychology

## 2011-06-21 DIAGNOSIS — F331 Major depressive disorder, recurrent, moderate: Secondary | ICD-10-CM | POA: Diagnosis not present

## 2011-06-21 DIAGNOSIS — J309 Allergic rhinitis, unspecified: Secondary | ICD-10-CM | POA: Diagnosis not present

## 2011-06-25 ENCOUNTER — Telehealth: Payer: Self-pay | Admitting: *Deleted

## 2011-06-25 MED ORDER — TRIAMTERENE-HCTZ 37.5-25 MG PO TABS: 1.0000 | ORAL_TABLET | Freq: Every day | ORAL | Status: DC

## 2011-06-25 NOTE — Telephone Encounter (Signed)
Add Maxzide 1 a day in am Thx

## 2011-06-25 NOTE — Telephone Encounter (Signed)
Pt states that she wants her BP medication changed because her BP is continuing to run high. Pt states that she has checked it this morning and it was 143/99. Pt is concerned because BP is always high and she is constantly having HA's and is having SOB. Pt states that when she went to cardiology they didn't find anything wrong with her heart, but she feels like something is going on with her BP-please advise

## 2011-06-25 NOTE — Telephone Encounter (Signed)
Pt informed of New BP medication.

## 2011-06-25 NOTE — Telephone Encounter (Signed)
Left message for pt to callback office.  

## 2011-06-28 ENCOUNTER — Ambulatory Visit (INDEPENDENT_AMBULATORY_CARE_PROVIDER_SITE_OTHER): Payer: Medicare Other | Admitting: Psychology

## 2011-06-28 DIAGNOSIS — F331 Major depressive disorder, recurrent, moderate: Secondary | ICD-10-CM

## 2011-06-30 ENCOUNTER — Other Ambulatory Visit: Payer: Self-pay

## 2011-06-30 DIAGNOSIS — J309 Allergic rhinitis, unspecified: Secondary | ICD-10-CM | POA: Diagnosis not present

## 2011-06-30 MED ORDER — PINDOLOL 5 MG PO TABS: 5.0000 mg | ORAL_TABLET | Freq: Two times a day (BID) | ORAL | Status: DC

## 2011-07-05 ENCOUNTER — Encounter (HOSPITAL_COMMUNITY): Payer: Self-pay | Admitting: Psychiatry

## 2011-07-05 ENCOUNTER — Ambulatory Visit (INDEPENDENT_AMBULATORY_CARE_PROVIDER_SITE_OTHER): Payer: Medicare Other | Admitting: Psychology

## 2011-07-05 ENCOUNTER — Ambulatory Visit (INDEPENDENT_AMBULATORY_CARE_PROVIDER_SITE_OTHER): Payer: Medicare Other | Admitting: Psychiatry

## 2011-07-05 DIAGNOSIS — F331 Major depressive disorder, recurrent, moderate: Secondary | ICD-10-CM

## 2011-07-05 DIAGNOSIS — F329 Major depressive disorder, single episode, unspecified: Secondary | ICD-10-CM

## 2011-07-05 NOTE — Progress Notes (Signed)
Chief complaint I'm doing better  History of present illness Patient is 78 year old [place] American female who came for her followup appointment. Patient is taking Lexapro 10 mg. She reported no side effects of medication. She feel less anxious and less depressed. She also taking lorazepam however in recent weeks she does not need to take many times. She denies any crying spells or panic attack. She is seeing therapist once a month. She denies any agitation anger or mood swings. Though she is concerned about her physical health but overall she is less anxious and less depressed.   Current psychiatric medication Lexapro 10 mg daily Lorazepam 0.5 mg as needed  Mental status examination Patient is casually dressed and fairly groomed. She appears calm cooperative and pleasant. She maintained good eye contact. Her speech is soft clear. Her thought process is logical linear and goal-directed. She described her mood is pleasant and her affect is mood congruent. She denies any active or passive suicidal thinking and homicidal thinking. She denies any auditory or visual hallucination. There were no tremors or shakes present. Her attention and concentration is good. She's alert and oriented x3. Her insight judgment and impulse control is okay.  Assessment Axis I Maj. depressive disorder Axis II deferred Axis III irritable bowel syndrome, GERD, hypertension, hypothyroidism, history of breast cancer, osteoarthritis and vitamin D deficiency  Plan I will continue Lexapro 10 mg. Patient wants to come off from medication however I explained risks and benefits of medication and chances of decompensation with stopping medication. We will consider lowering Lexapro on next visit. I recommended to call us if she has any question or concern about the medication or if she feels worsening of her symptoms. I will see her again in 3 months.

## 2011-07-06 ENCOUNTER — Telehealth: Payer: Self-pay

## 2011-07-06 MED ORDER — SPIRONOLACTONE 25 MG PO TABS: 25.0000 mg | ORAL_TABLET | Freq: Every day | ORAL | Status: DC

## 2011-07-06 NOTE — Telephone Encounter (Signed)
D/c maxzide Start Spironolactone 25 mg a day Thx

## 2011-07-06 NOTE — Telephone Encounter (Signed)
Pt called stating that she is having severe leg and thigh cramps due to additional diuretic. Pt says he BP has decreased but cramps are very painful. Pt is requesting advisement from MD.

## 2011-07-07 NOTE — Telephone Encounter (Signed)
Pt informed

## 2011-07-07 NOTE — Telephone Encounter (Signed)
Left mess for patient to call back.  

## 2011-07-12 ENCOUNTER — Ambulatory Visit: Payer: Medicare Other | Admitting: Psychology

## 2011-07-15 DIAGNOSIS — J309 Allergic rhinitis, unspecified: Secondary | ICD-10-CM | POA: Diagnosis not present

## 2011-07-19 ENCOUNTER — Ambulatory Visit: Payer: Medicare Other | Admitting: Psychology

## 2011-07-26 ENCOUNTER — Ambulatory Visit: Payer: Medicare Other | Admitting: Psychology

## 2011-07-27 DIAGNOSIS — J309 Allergic rhinitis, unspecified: Secondary | ICD-10-CM | POA: Diagnosis not present

## 2011-08-02 ENCOUNTER — Ambulatory Visit: Payer: Medicare Other | Admitting: Psychology

## 2011-08-09 ENCOUNTER — Ambulatory Visit (INDEPENDENT_AMBULATORY_CARE_PROVIDER_SITE_OTHER): Payer: Medicare Other | Admitting: Psychology

## 2011-08-09 DIAGNOSIS — F331 Major depressive disorder, recurrent, moderate: Secondary | ICD-10-CM | POA: Diagnosis not present

## 2011-08-09 DIAGNOSIS — H4011X0 Primary open-angle glaucoma, stage unspecified: Secondary | ICD-10-CM | POA: Diagnosis not present

## 2011-08-09 DIAGNOSIS — H2510 Age-related nuclear cataract, unspecified eye: Secondary | ICD-10-CM | POA: Diagnosis not present

## 2011-08-11 DIAGNOSIS — J309 Allergic rhinitis, unspecified: Secondary | ICD-10-CM | POA: Diagnosis not present

## 2011-08-16 ENCOUNTER — Ambulatory Visit (INDEPENDENT_AMBULATORY_CARE_PROVIDER_SITE_OTHER): Payer: Medicare Other | Admitting: Psychology

## 2011-08-16 DIAGNOSIS — F331 Major depressive disorder, recurrent, moderate: Secondary | ICD-10-CM | POA: Diagnosis not present

## 2011-08-19 ENCOUNTER — Ambulatory Visit (INDEPENDENT_AMBULATORY_CARE_PROVIDER_SITE_OTHER): Payer: Medicare Other | Admitting: *Deleted

## 2011-08-19 ENCOUNTER — Other Ambulatory Visit: Payer: Self-pay | Admitting: Internal Medicine

## 2011-08-19 DIAGNOSIS — I495 Sick sinus syndrome: Secondary | ICD-10-CM

## 2011-08-19 DIAGNOSIS — I4891 Unspecified atrial fibrillation: Secondary | ICD-10-CM

## 2011-08-19 DIAGNOSIS — I472 Ventricular tachycardia: Secondary | ICD-10-CM | POA: Diagnosis not present

## 2011-08-23 ENCOUNTER — Ambulatory Visit: Payer: Medicare Other | Admitting: Psychology

## 2011-08-24 ENCOUNTER — Encounter: Payer: Self-pay | Admitting: Internal Medicine

## 2011-08-24 ENCOUNTER — Ambulatory Visit (INDEPENDENT_AMBULATORY_CARE_PROVIDER_SITE_OTHER): Payer: Medicare Other | Admitting: Psychology

## 2011-08-24 DIAGNOSIS — F331 Major depressive disorder, recurrent, moderate: Secondary | ICD-10-CM

## 2011-08-24 DIAGNOSIS — J309 Allergic rhinitis, unspecified: Secondary | ICD-10-CM | POA: Diagnosis not present

## 2011-08-30 ENCOUNTER — Encounter: Payer: Self-pay | Admitting: Internal Medicine

## 2011-08-30 ENCOUNTER — Ambulatory Visit (INDEPENDENT_AMBULATORY_CARE_PROVIDER_SITE_OTHER): Payer: Medicare Other | Admitting: Internal Medicine

## 2011-08-30 VITALS — BP 148/98 | HR 84 | Temp 98.2°F | Resp 16 | Wt 177.0 lb

## 2011-08-30 DIAGNOSIS — F3289 Other specified depressive episodes: Secondary | ICD-10-CM

## 2011-08-30 DIAGNOSIS — F411 Generalized anxiety disorder: Secondary | ICD-10-CM

## 2011-08-30 DIAGNOSIS — K219 Gastro-esophageal reflux disease without esophagitis: Secondary | ICD-10-CM | POA: Diagnosis not present

## 2011-08-30 DIAGNOSIS — F329 Major depressive disorder, single episode, unspecified: Secondary | ICD-10-CM | POA: Diagnosis not present

## 2011-08-30 DIAGNOSIS — I10 Essential (primary) hypertension: Secondary | ICD-10-CM | POA: Diagnosis not present

## 2011-08-30 LAB — REMOTE PACEMAKER DEVICE
AL IMPEDENCE PM: 480 Ohm
AL THRESHOLD: 0.625 V
RV LEAD IMPEDENCE PM: 530 Ohm
RV LEAD THRESHOLD: 0.5 V

## 2011-08-30 NOTE — Assessment & Plan Note (Signed)
Continue with current prescription therapy as reflected on the Med list.  

## 2011-08-30 NOTE — Progress Notes (Signed)
Patient ID: [name], female   DOB: [birth year], 78 y.o.   MRN: [id number] Patient ID: [name], female   DOB: July 15, 1929, 78 y.o.   MRN: [id number] Patient ID: [name], female   DOB: April 12, 1930, 78 y.o.   MRN: [id number]  Subjective:    Patient ID: [name], female    DOB: [birth year], 78 y.o.   MRN: [id number]  HPI     The patient is here to follow up on chronic IBS, GERD,  depression, anxiety, SOB symptoms controlled with medicines   Wt Readings from Last 3 Encounters:  08/30/11 177 lb (80.287 kg)  05/31/11 178 lb (80.74 kg)  05/18/11 171 lb (77.565 kg)   BP Readings from Last 3 Encounters:  08/30/11 148/98  06/08/11 142/103  05/31/11 158/100      Past Medical History  Diagnosis Date  . Thyroid disease   . Glaucoma   . IBS (irritable bowel syndrome)   . Incontinence   . GERD (gastroesophageal reflux disease)   . Hypertension   . Anxiety   . Depression     Dr Dellia Cloud  . HX: breast cancer   . Osteoarthritis   . Osteopenia   . Bradycardia   . IBS (irritable bowel syndrome)   . Discoid lupus     skin  . Pacemaker     Brady//Chronotropic incompetence with normal pacemaker function   Past Surgical History  Procedure Date  . Mastectomy     B  . Pacemaker placement 2009  . Foot surgery     Left    reports that she has never smoked. She does not have any smokeless tobacco history on file. She reports that she does not drink alcohol or use illicit drugs. family history includes Cancer in her brother and mother; Heart disease in her brother and father; and Stroke in her brother and sister. Allergies  Allergen Reactions  . Amlodipine Besy-Benazepril Hcl   . Amlodipine Besylate   . Clonidine Hydrochloride     REACTION: tired  . Valsartan   . Verapamil    Current Outpatient Prescriptions on File Prior to Visit  Medication Sig Dispense Refill  . albuterol (PROVENTIL HFA;VENTOLIN HFA) 108 (90 BASE) MCG/ACT inhaler Inhale 2 puffs into the lungs every 6 (six)  hours as needed for wheezing.  1 Inhaler  5  . allopurinol (ZYLOPRIM) 300 MG tablet TAKE 1 TABLET BY MOUTH EVERY DAY  30 tablet  6  . brimonidine (ALPHAGAN P) 0.1 % SOLN 2 (two) times daily.        . Cetirizine HCl (ZYRTEC ALLERGY) 10 MG CAPS Take by mouth.        . Cholecalciferol (VITAMIN D) 1000 UNITS capsule Take 1,000 Units by mouth daily.        [name] escitalopram (LEXAPRO) 20 MG tablet Take 10 mg by mouth daily.      . furosemide (LASIX) 20 MG tablet Take 1 tablet (20 mg total) by mouth daily.  30 tablet  11  . levothyroxine (SYNTHROID, LEVOTHROID) 100 MCG tablet TAKE ONE TABLET BY MOUTH EVERY DAY  90 tablet  1  . LORazepam (ATIVAN) 0.5 MG tablet TAKE ONE TABLET BY MOUTH EVERY DAY AS NEEDED  30 tablet  0  . Multiple Vitamin (MULTIVITAMIN) tablet Take 1 tablet by mouth daily.        . NON FORMULARY Allergy shots-every other week       . pindolol (VISKEN) 5 MG tablet Take 1 tablet (5 mg total) by mouth 2 (  two) times daily.  180 tablet  1  . Psyllium (METAMUCIL) 30.9 % POWD Take by mouth.        . RABEprazole (ACIPHEX) 20 MG tablet Take 20 mg by mouth daily.        [name] spironolactone (ALDACTONE) 25 MG tablet Take 1 tablet (25 mg total) by mouth daily.  30 tablet  5   Review of Systems Review of Systems  Constitutional: Negative for diaphoresis and unexpected weight change.  HENT: Negative for drooling and tinnitus.   Eyes: Negative for photophobia and visual disturbance.  Respiratory: Negative for choking and stridor.   Gastrointestinal: Negative for vomiting and blood in stool.  Genitourinary: Negative for hematuria and decreased urine volume.  Musculoskeletal: Negative for gait problem.  Skin: Negative for color change and wound.  Neurological: Negative for tremors and numbness.  Psychiatric/Behavioral: Negative for decreased concentration. The patient is not hyperactive.       Objective:   Physical Exam BP 148/98  Pulse 84  Temp(Src) 98.2 F (36.8 C) (Oral)  Resp 16  Wt 177 lb  (80.287 kg) Physical Exam  VS noted Constitutional: Pt appears well-developed and well-nourished.  HENT: Head: Normocephalic.  Right Ear: External ear normal.  Left Ear: External ear normal.  Eyes: Conjunctivae and EOM are normal. Pupils are equal, round, and reactive to light.  Neck: Normal range of motion. Neck supple.  Cardiovascular: Normal rate and regular rhythm.   Pulmonary/Chest: Effort normal and breath sounds normal. no rales or wheezing Abd:  Soft, NT, non-distended, + BS Neurological: Pt is alert. No cranial nerve deficit.  Skin: Skin is warm. No erythema.  Psychiatric: Pt behavior is normal. Thought content normal.   CT angio - ok ECHO - OK for age  Lab Results  Component Value Date   WBC 4.4* 04/13/2011   HGB 14.4 04/13/2011   HCT 42.7 04/13/2011   PLT 220.0 04/13/2011   GLUCOSE 91 04/13/2011   CHOL 174 05/11/2010   TRIG 78.0 05/11/2010   HDL 52.30 05/11/2010   LDLDIRECT 130.8 09/30/2006   LDLCALC 106* 05/11/2010   ALT 15 04/13/2011   AST 22 04/13/2011   NA 140 04/13/2011   K 4.2 04/13/2011   CL 102 04/13/2011   CREATININE 1.0 04/13/2011   BUN 22 04/13/2011   CO2 30 04/13/2011   TSH 2.62 04/13/2011   INR 1.00 12/13/2009   HGBA1C 5.9 01/29/2010       Assessment & Plan:

## 2011-08-31 NOTE — Progress Notes (Signed)
Remote pacer check  

## 2011-09-06 ENCOUNTER — Ambulatory Visit (INDEPENDENT_AMBULATORY_CARE_PROVIDER_SITE_OTHER): Payer: Medicare Other | Admitting: Psychology

## 2011-09-06 ENCOUNTER — Ambulatory Visit (INDEPENDENT_AMBULATORY_CARE_PROVIDER_SITE_OTHER): Payer: Medicare Other | Admitting: Psychiatry

## 2011-09-06 DIAGNOSIS — F331 Major depressive disorder, recurrent, moderate: Secondary | ICD-10-CM | POA: Diagnosis not present

## 2011-09-06 DIAGNOSIS — J309 Allergic rhinitis, unspecified: Secondary | ICD-10-CM | POA: Diagnosis not present

## 2011-09-06 DIAGNOSIS — F329 Major depressive disorder, single episode, unspecified: Secondary | ICD-10-CM

## 2011-09-06 MED ORDER — ESCITALOPRAM OXALATE 10 MG PO TABS: 10.0000 mg | ORAL_TABLET | Freq: Every day | ORAL | Status: DC

## 2011-09-06 NOTE — Progress Notes (Signed)
Chief complaint I don't have a good Mother's Day.    History of present illness Patient is 78 year old [place] American female who came for her followup appointment.  Patient told she did not have a good Mother's Day yesterday.  Her daughter came for did not spend time with her .  Patient has issues with her daughter in the past.  She also having some issues with her church organization however she is taking her Lexapro and feel that she needed.  She's also scheduled to see her therapist Dr. Dellia Cloud today .  She denies any recent crying spells and her sleep is fine.  She still takes lorazepam only as needed.  She denies any side effects of Lexapro.  She admitted that she is learning the fact of life .  Patient endorse that she always struggle dealing with her daughter and she does not have a lot of expectation from her.  She denies any agitation or anger.  She denies any crying spells.  Her energy level remains same.  She denies any panic attack .  She denies an active or passive suicidal thoughts.  She continued to endorse concern about her physical health.  She is scheduled to see her primary care Dr. and blood work next month.  Current psychiatric medication Lexapro 10 mg daily Lorazepam 0.5 mg as needed  Medical history Patient has history of hypothyroidism, irritable bowel syndrome, vitamin D deficiency, atrial fibrillation, hypertension, osteoarthritis and history of breast cancer.  She see Dr. Aura Camps.  Mental status examination Patient is casually dressed and fairly groomed. She appears sad however she is cooperative and maintained good eye contact.  Her speech is slow but clear and coherent.  Her thought processes slow but logical linear goal-directed.  There were no flight of idea or loose association.  She denies any active or passive suicidal thinking and homicidal thinking.  Her affect is constricted.  Her attention and concentration is fair.  She's alert and oriented x3.  There were  no psychotic symptoms present at this time.  Her insight judgment and impulse control is okay.  Assessment Axis I Maj. depressive disorder Axis II deferred Axis III irritable bowel syndrome, GERD, hypertension, hypothyroidism, history of breast cancer, osteoarthritis and vitamin D deficiency  Plan Reassurance given.  I recommend to see therapist regularly for coping skills.  I will continue Lexapro 10 mg daily.  She will continue lorazepam 0.5 mg as needed.  She does not need a new prescription of lorazepam.  I explained risks and benefits of medication in detail.  I recommend to call us if she want to see [place] only or if she feels worsening of her symptoms otherwise I will see her again in 3 months.

## 2011-09-15 ENCOUNTER — Encounter: Payer: Self-pay | Admitting: *Deleted

## 2011-09-21 ENCOUNTER — Telehealth: Payer: Self-pay | Admitting: Internal Medicine

## 2011-09-21 NOTE — Telephone Encounter (Signed)
Caller: Tyree/Patient; PCP: Plotnikov, Alex; CB#: [phone]; Call regarding Muscle Cramps; requests K+ Rx.   Advised see in 24 hours per nursing judgment and No Guideline protocols.  No appointments available at office; front desk instructed RN to send information to PCP for follow up and that provider covering for him will take care of this.

## 2011-09-22 ENCOUNTER — Telehealth: Payer: Self-pay | Admitting: Internal Medicine

## 2011-09-22 DIAGNOSIS — I10 Essential (primary) hypertension: Secondary | ICD-10-CM

## 2011-09-22 NOTE — Telephone Encounter (Signed)
Pt complaining of leg cramps and is requesting rx of potassium/ none on MAR. She has been talking with Dr Posey Rea office and states nothing has been done to help her. bmet ordered for tomorrow, pt states she is going to stop lasix cause she cant take the cramping, pt sees Dr Graciela Husbands and I will update Dr/nurse.

## 2011-09-22 NOTE — Telephone Encounter (Signed)
PT REQUESTING REFILL OF POTASSIUM @ [facility] PT GETS IT FROM [facility] BUT AN'T GET HIM TO FILL IT, PLS CALL IF ANY PROBLEM

## 2011-09-23 ENCOUNTER — Other Ambulatory Visit (INDEPENDENT_AMBULATORY_CARE_PROVIDER_SITE_OTHER): Payer: Medicare Other

## 2011-09-23 ENCOUNTER — Ambulatory Visit (INDEPENDENT_AMBULATORY_CARE_PROVIDER_SITE_OTHER): Payer: Medicare Other | Admitting: Psychology

## 2011-09-23 DIAGNOSIS — I10 Essential (primary) hypertension: Secondary | ICD-10-CM | POA: Diagnosis not present

## 2011-09-23 DIAGNOSIS — F331 Major depressive disorder, recurrent, moderate: Secondary | ICD-10-CM

## 2011-09-23 LAB — BASIC METABOLIC PANEL
BUN: 20 mg/dL (ref 6–23)
Calcium: 9 mg/dL (ref 8.4–10.5)
GFR: 75.6 mL/min (ref >60.00)
Glucose, Bld: 90 mg/dL (ref 70–99)
Sodium: 142 mEq/L (ref 135–145)

## 2011-09-24 NOTE — Telephone Encounter (Signed)
The patient is aware of her results. She states she has been taking an OTC potassium gluconate 550 mg daily. She held her lasix for 1 day due to cramping. This was on Wednesday. She has restarted this. She has not noticed any cramping yesterday or today. Per Dr. Graciela Husbands, the patient may need a magnesium supplement. She will continue her medications with recommendations that she may take OTC magnesium. If her cramping re-occurs, she will hold her lasix for about 3-4 days to see if this resolves.

## 2011-09-26 ENCOUNTER — Other Ambulatory Visit: Payer: Self-pay | Admitting: *Deleted

## 2011-09-26 NOTE — Telephone Encounter (Signed)
Have to call patient to find out right dosage of allopurinol

## 2011-09-28 DIAGNOSIS — J309 Allergic rhinitis, unspecified: Secondary | ICD-10-CM | POA: Diagnosis not present

## 2011-09-30 NOTE — Telephone Encounter (Signed)
Dr. Graciela Husbands refilled this  per scription per Marchelle Folks

## 2011-10-04 ENCOUNTER — Ambulatory Visit (INDEPENDENT_AMBULATORY_CARE_PROVIDER_SITE_OTHER): Payer: Medicare Other | Admitting: Psychology

## 2011-10-04 DIAGNOSIS — F331 Major depressive disorder, recurrent, moderate: Secondary | ICD-10-CM | POA: Diagnosis not present

## 2011-10-08 DIAGNOSIS — S058X9A Other injuries of unspecified eye and orbit, initial encounter: Secondary | ICD-10-CM | POA: Diagnosis not present

## 2011-10-08 DIAGNOSIS — H4011X0 Primary open-angle glaucoma, stage unspecified: Secondary | ICD-10-CM | POA: Diagnosis not present

## 2011-10-11 ENCOUNTER — Telehealth: Payer: Self-pay | Admitting: Internal Medicine

## 2011-10-11 DIAGNOSIS — H9190 Unspecified hearing loss, unspecified ear: Secondary | ICD-10-CM

## 2011-10-11 NOTE — Telephone Encounter (Signed)
Ok for referral?

## 2011-10-11 NOTE — Telephone Encounter (Signed)
[facility] [phone]) for a hearing test.  She lost one of her hearing aids and needs an evaluation before they will replace it.  There fax number is [phone].

## 2011-10-12 NOTE — Telephone Encounter (Signed)
Patient is aware that PCC's will call her

## 2011-10-15 DIAGNOSIS — J309 Allergic rhinitis, unspecified: Secondary | ICD-10-CM | POA: Diagnosis not present

## 2011-10-18 ENCOUNTER — Ambulatory Visit (INDEPENDENT_AMBULATORY_CARE_PROVIDER_SITE_OTHER): Payer: Medicare Other | Admitting: Psychology

## 2011-10-18 DIAGNOSIS — F331 Major depressive disorder, recurrent, moderate: Secondary | ICD-10-CM | POA: Diagnosis not present

## 2011-11-01 ENCOUNTER — Ambulatory Visit (INDEPENDENT_AMBULATORY_CARE_PROVIDER_SITE_OTHER): Payer: Medicare Other | Admitting: Psychology

## 2011-11-01 DIAGNOSIS — J309 Allergic rhinitis, unspecified: Secondary | ICD-10-CM | POA: Diagnosis not present

## 2011-11-01 DIAGNOSIS — F331 Major depressive disorder, recurrent, moderate: Secondary | ICD-10-CM

## 2011-11-04 DIAGNOSIS — K5730 Diverticulosis of large intestine without perforation or abscess without bleeding: Secondary | ICD-10-CM | POA: Diagnosis not present

## 2011-11-04 DIAGNOSIS — D126 Benign neoplasm of colon, unspecified: Secondary | ICD-10-CM | POA: Diagnosis not present

## 2011-11-04 DIAGNOSIS — Z86010 Personal history of colonic polyps: Secondary | ICD-10-CM | POA: Diagnosis not present

## 2011-11-04 DIAGNOSIS — Z1211 Encounter for screening for malignant neoplasm of colon: Secondary | ICD-10-CM | POA: Diagnosis not present

## 2011-11-05 DIAGNOSIS — J309 Allergic rhinitis, unspecified: Secondary | ICD-10-CM | POA: Diagnosis not present

## 2011-11-08 ENCOUNTER — Ambulatory Visit (INDEPENDENT_AMBULATORY_CARE_PROVIDER_SITE_OTHER): Payer: Medicare Other | Admitting: Psychology

## 2011-11-08 DIAGNOSIS — J309 Allergic rhinitis, unspecified: Secondary | ICD-10-CM | POA: Diagnosis not present

## 2011-11-08 DIAGNOSIS — F331 Major depressive disorder, recurrent, moderate: Secondary | ICD-10-CM | POA: Diagnosis not present

## 2011-11-15 ENCOUNTER — Ambulatory Visit: Payer: Medicare Other | Admitting: Psychology

## 2011-11-18 DIAGNOSIS — J309 Allergic rhinitis, unspecified: Secondary | ICD-10-CM | POA: Diagnosis not present

## 2011-11-22 ENCOUNTER — Ambulatory Visit (INDEPENDENT_AMBULATORY_CARE_PROVIDER_SITE_OTHER): Payer: Medicare Other | Admitting: Psychology

## 2011-11-22 ENCOUNTER — Other Ambulatory Visit (HOSPITAL_COMMUNITY): Payer: Self-pay | Admitting: Psychiatry

## 2011-11-22 DIAGNOSIS — F331 Major depressive disorder, recurrent, moderate: Secondary | ICD-10-CM

## 2011-11-22 DIAGNOSIS — F329 Major depressive disorder, single episode, unspecified: Secondary | ICD-10-CM

## 2011-11-24 DIAGNOSIS — J309 Allergic rhinitis, unspecified: Secondary | ICD-10-CM | POA: Diagnosis not present

## 2011-11-29 ENCOUNTER — Ambulatory Visit (INDEPENDENT_AMBULATORY_CARE_PROVIDER_SITE_OTHER): Payer: Medicare Other | Admitting: Psychology

## 2011-11-29 DIAGNOSIS — F331 Major depressive disorder, recurrent, moderate: Secondary | ICD-10-CM

## 2011-12-02 ENCOUNTER — Ambulatory Visit (INDEPENDENT_AMBULATORY_CARE_PROVIDER_SITE_OTHER): Payer: Medicare Other | Admitting: *Deleted

## 2011-12-02 DIAGNOSIS — I4891 Unspecified atrial fibrillation: Secondary | ICD-10-CM | POA: Diagnosis not present

## 2011-12-05 LAB — REMOTE PACEMAKER DEVICE
AL IMPEDENCE PM: 480 Ohm
AL THRESHOLD: 0.625 V
BATTERY VOLTAGE: 2.78 V
RV LEAD AMPLITUDE: 16 mv
VENTRICULAR PACING PM: 0

## 2011-12-06 ENCOUNTER — Ambulatory Visit (HOSPITAL_COMMUNITY): Payer: Medicare Other | Admitting: Psychiatry

## 2011-12-06 ENCOUNTER — Ambulatory Visit (INDEPENDENT_AMBULATORY_CARE_PROVIDER_SITE_OTHER): Payer: Medicare Other | Admitting: Psychology

## 2011-12-06 DIAGNOSIS — F331 Major depressive disorder, recurrent, moderate: Secondary | ICD-10-CM

## 2011-12-08 DIAGNOSIS — J309 Allergic rhinitis, unspecified: Secondary | ICD-10-CM | POA: Diagnosis not present

## 2011-12-09 ENCOUNTER — Other Ambulatory Visit: Payer: Self-pay | Admitting: Internal Medicine

## 2011-12-13 ENCOUNTER — Ambulatory Visit (INDEPENDENT_AMBULATORY_CARE_PROVIDER_SITE_OTHER): Payer: Medicare Other | Admitting: Psychology

## 2011-12-13 DIAGNOSIS — F331 Major depressive disorder, recurrent, moderate: Secondary | ICD-10-CM

## 2011-12-14 DIAGNOSIS — J309 Allergic rhinitis, unspecified: Secondary | ICD-10-CM | POA: Diagnosis not present

## 2011-12-15 ENCOUNTER — Other Ambulatory Visit (INDEPENDENT_AMBULATORY_CARE_PROVIDER_SITE_OTHER): Payer: Medicare Other

## 2011-12-15 ENCOUNTER — Other Ambulatory Visit: Payer: Self-pay | Admitting: *Deleted

## 2011-12-15 DIAGNOSIS — I10 Essential (primary) hypertension: Secondary | ICD-10-CM

## 2011-12-15 DIAGNOSIS — F329 Major depressive disorder, single episode, unspecified: Secondary | ICD-10-CM

## 2011-12-15 DIAGNOSIS — F411 Generalized anxiety disorder: Secondary | ICD-10-CM

## 2011-12-15 DIAGNOSIS — K219 Gastro-esophageal reflux disease without esophagitis: Secondary | ICD-10-CM | POA: Diagnosis not present

## 2011-12-15 DIAGNOSIS — R300 Dysuria: Secondary | ICD-10-CM

## 2011-12-15 LAB — BASIC METABOLIC PANEL
CO2: 28 mEq/L (ref 19–32)
Calcium: 9.1 mg/dL (ref 8.4–10.5)
Chloride: 101 mEq/L (ref 96–112)
Glucose, Bld: 102 mg/dL — ABNORMAL HIGH (ref 70–99)
Sodium: 136 mEq/L (ref 135–145)

## 2011-12-15 LAB — TSH: TSH: 1.07 u[IU]/mL (ref 0.35–5.50)

## 2011-12-15 LAB — HEPATIC FUNCTION PANEL
Albumin: 3.8 g/dL (ref 3.5–5.2)
Alkaline Phosphatase: 72 U/L (ref 39–117)

## 2011-12-16 ENCOUNTER — Other Ambulatory Visit (INDEPENDENT_AMBULATORY_CARE_PROVIDER_SITE_OTHER): Payer: Medicare Other

## 2011-12-16 DIAGNOSIS — R300 Dysuria: Secondary | ICD-10-CM

## 2011-12-16 LAB — URINALYSIS, ROUTINE W REFLEX MICROSCOPIC
Leukocytes, UA: NEGATIVE
Nitrite: NEGATIVE
Specific Gravity, Urine: 1.01 (ref 1.000–1.030)
pH: 8 (ref 5.0–8.0)

## 2011-12-20 ENCOUNTER — Encounter: Payer: Self-pay | Admitting: Internal Medicine

## 2011-12-20 ENCOUNTER — Ambulatory Visit (INDEPENDENT_AMBULATORY_CARE_PROVIDER_SITE_OTHER): Payer: Medicare Other | Admitting: Psychiatry

## 2011-12-20 ENCOUNTER — Encounter (HOSPITAL_COMMUNITY): Payer: Self-pay | Admitting: Psychiatry

## 2011-12-20 ENCOUNTER — Ambulatory Visit (INDEPENDENT_AMBULATORY_CARE_PROVIDER_SITE_OTHER): Payer: Medicare Other | Admitting: Internal Medicine

## 2011-12-20 ENCOUNTER — Ambulatory Visit (INDEPENDENT_AMBULATORY_CARE_PROVIDER_SITE_OTHER): Payer: Medicare Other | Admitting: Psychology

## 2011-12-20 VITALS — BP 112/78 | HR 80 | Temp 97.9°F | Ht 64.0 in | Wt 179.0 lb

## 2011-12-20 VITALS — BP 112/78 | HR 76 | Wt 179.0 lb

## 2011-12-20 DIAGNOSIS — M25569 Pain in unspecified knee: Secondary | ICD-10-CM

## 2011-12-20 DIAGNOSIS — I10 Essential (primary) hypertension: Secondary | ICD-10-CM | POA: Diagnosis not present

## 2011-12-20 DIAGNOSIS — E559 Vitamin D deficiency, unspecified: Secondary | ICD-10-CM

## 2011-12-20 DIAGNOSIS — M170 Bilateral primary osteoarthritis of knee: Secondary | ICD-10-CM | POA: Insufficient documentation

## 2011-12-20 DIAGNOSIS — M1990 Unspecified osteoarthritis, unspecified site: Secondary | ICD-10-CM

## 2011-12-20 DIAGNOSIS — F331 Major depressive disorder, recurrent, moderate: Secondary | ICD-10-CM | POA: Diagnosis not present

## 2011-12-20 DIAGNOSIS — F3289 Other specified depressive episodes: Secondary | ICD-10-CM

## 2011-12-20 DIAGNOSIS — K1370 Unspecified lesions of oral mucosa: Secondary | ICD-10-CM

## 2011-12-20 DIAGNOSIS — E039 Hypothyroidism, unspecified: Secondary | ICD-10-CM

## 2011-12-20 DIAGNOSIS — J309 Allergic rhinitis, unspecified: Secondary | ICD-10-CM | POA: Diagnosis not present

## 2011-12-20 DIAGNOSIS — N3289 Other specified disorders of bladder: Secondary | ICD-10-CM

## 2011-12-20 DIAGNOSIS — M25561 Pain in right knee: Secondary | ICD-10-CM | POA: Insufficient documentation

## 2011-12-20 DIAGNOSIS — F329 Major depressive disorder, single episode, unspecified: Secondary | ICD-10-CM

## 2011-12-20 DIAGNOSIS — M25562 Pain in left knee: Secondary | ICD-10-CM

## 2011-12-20 DIAGNOSIS — K062 Gingival and edentulous alveolar ridge lesions associated with trauma: Secondary | ICD-10-CM

## 2011-12-20 MED ORDER — ESCITALOPRAM OXALATE 10 MG PO TABS: 10.0000 mg | ORAL_TABLET | Freq: Every day | ORAL | Status: DC

## 2011-12-20 MED ORDER — TOLTERODINE TARTRATE ER 4 MG PO CP24: 4.0000 mg | ORAL_CAPSULE | Freq: Every day | ORAL | Status: DC

## 2011-12-20 NOTE — Progress Notes (Signed)
Chief complaint I'm doing better on her medication.  I am seeing therapist once a month.      History of present illness Patient is 78 year old [place] American female who came for her followup appointment.  Patient is doing better on her current psychiatric medication.  She has not taken recently any lorazepam however she is compliant with her Lexapro.  She denies any side effects.  She continues to have some residual symptoms of depression but she feel therapy is helping and working.  She saw her primary care physician today.  Her blood work shows glucose 102 otherwise she has normal hepatic panel CBC, TSH and chemistry.  Blood was drawn on 12/15/2011.  She denies any agitation anger mood swing.  She denies any recent crying spells.  Her summer was good as she was able to see her grandkids more often.  She denies any feeling of worthlessness or hopelessness.  She's not drinking or using any illegal substance.  Current psychiatric medication Lexapro 10 mg daily Lorazepam 0.5 mg as needed  Medical history Patient has history of hypothyroidism, irritable bowel syndrome, vitamin D deficiency, atrial fibrillation, hypertension, osteoarthritis and history of breast cancer.  She see Dr. Aura Camps.  Past psychiatric history Patient has one history of psychiatric inpatient treatment many years ago when she is admitted due to suicidal thinking however patient denied ever any suicidal thoughts or attempt.  Psychosocial history Patient lives alone however she has a good support system.  She has 2 sons and one daughter.  She has several grandkids.  All of them live close by.  Mental status examination Patient is casually dressed and fairly groomed. She appears pleasant and cooperative.  She maintained good eye contact.  Her speech is clear and coherent.  Her thought processes are logical linear goal-directed.  She described her mood is neutral and her affect is mood congruent.  She denies any active or  passive suicidal thoughts or homicidal thoughts.  She denies any auditory or visual hallucination.  There no psychotic symptoms present at this time.  She's alert and oriented x3.  Her insight judgment and impulse control is okay.  Assessment Axis I Maj. depressive disorder Axis II deferred Axis III irritable bowel syndrome, GERD, hypertension, hypothyroidism, history of breast cancer, osteoarthritis and vitamin D deficiency  Plan I review her psychosocial stressors, recent lab work , last progress note and response to the medication.  She is doing better on her Lexapro.  She has not taken lorazepam in recent weeks .  Her anxiety is better.  I will continue her current psychiatric medication.  I recommend to call us if she is any question or concern about the medication.  She will see therapist once a month.  I will see her again in 3 months.  Time spent 30 minutes.

## 2011-12-20 NOTE — Assessment & Plan Note (Signed)
Ortho appt is pending 

## 2011-12-20 NOTE — Assessment & Plan Note (Signed)
Continue with current prescription therapy as reflected on the Med list.  

## 2011-12-20 NOTE — Progress Notes (Signed)
Subjective:    Patient ID: [name], female    DOB: [birth year], 78 y.o.   MRN: [id number]  HPI     The patient is here to follow up on chronic IBS, GERD,  depression, anxiety, SOB symptoms controlled with medicines C/o toothache since March 2013 after a root canal, still on pureed food C/o L knee pain   Wt Readings from Last 3 Encounters:  12/20/11 179 lb (81.194 kg)  08/30/11 177 lb (80.287 kg)  05/31/11 178 lb (80.74 kg)   BP Readings from Last 3 Encounters:  12/20/11 112/78  08/30/11 148/98  06/08/11 142/103      Past Medical History  Diagnosis Date  . Thyroid disease   . Glaucoma   . IBS (irritable bowel syndrome)   . Incontinence   . GERD (gastroesophageal reflux disease)   . Hypertension   . Anxiety   . Depression     Dr Dellia Cloud  . HX: breast cancer   . Osteoarthritis   . Osteopenia   . Bradycardia   . IBS (irritable bowel syndrome)   . Discoid lupus     skin  . Pacemaker     Brady//Chronotropic incompetence with normal pacemaker function   Past Surgical History  Procedure Date  . Mastectomy     B  . Pacemaker placement 2009  . Foot surgery     Left    reports that she has never smoked. She does not have any smokeless tobacco history on file. She reports that she does not drink alcohol or use illicit drugs. family history includes Cancer in her brother and mother; Heart disease in her brother and father; and Stroke in her brother and sister. Allergies  Allergen Reactions  . Amlodipine Besy-Benazepril Hcl   . Amlodipine Besylate   . Clonidine Hydrochloride     REACTION: tired  . Valsartan   . Verapamil    Current Outpatient Prescriptions on File Prior to Visit  Medication Sig Dispense Refill  . allopurinol (ZYLOPRIM) 300 MG tablet TAKE 1 TABLET BY MOUTH EVERY DAY  30 tablet  6  . brimonidine (ALPHAGAN P) 0.1 % SOLN 2 (two) times daily.        . Cetirizine HCl (ZYRTEC ALLERGY) 10 MG CAPS Take by mouth.        . Cholecalciferol (VITAMIN D)  1000 UNITS capsule Take 1,000 Units by mouth daily.        [name] escitalopram (LEXAPRO) 10 MG tablet Take 1 tablet (10 mg total) by mouth daily.  30 tablet  2  . levothyroxine (SYNTHROID, LEVOTHROID) 100 MCG tablet TAKE ONE TABLET BY MOUTH EVERY DAY  90 tablet  1  . LORazepam (ATIVAN) 0.5 MG tablet TAKE ONE TABLET BY MOUTH EVERY DAY AS NEEDED  30 tablet  0  . Multiple Vitamin (MULTIVITAMIN) tablet Take 1 tablet by mouth daily.        . NON FORMULARY Allergy shots-every other week       . pindolol (VISKEN) 5 MG tablet Take 1 tablet (5 mg total) by mouth 2 (two) times daily.  180 tablet  1  . Potassium Gluconate 550 MG TABS Take 1 tablet (550 mg total) by mouth daily.      . Psyllium (METAMUCIL) 30.9 % POWD Take by mouth.        . RABEprazole (ACIPHEX) 20 MG tablet Take 20 mg by mouth daily.        [name] spironolactone (ALDACTONE) 25 MG tablet Take 1 tablet (25 mg  total) by mouth daily.  30 tablet  5  . albuterol (PROVENTIL HFA;VENTOLIN HFA) 108 (90 BASE) MCG/ACT inhaler Inhale 2 puffs into the lungs every 6 (six) hours as needed for wheezing.  1 Inhaler  5  . furosemide (LASIX) 20 MG tablet Take 1 tablet (20 mg total) by mouth daily.  30 tablet  11   Review of Systems Review of Systems  Constitutional: Negative for diaphoresis and unexpected weight change.  HENT: Negative for drooling and tinnitus.   Eyes: Negative for photophobia and visual disturbance.  Respiratory: Negative for choking and stridor.   Gastrointestinal: Negative for vomiting and blood in stool.  Genitourinary: Negative for hematuria and decreased urine volume.  Musculoskeletal: Negative for gait problem.  Skin: Negative for color change and wound.  Neurological: Negative for tremors and numbness.  Psychiatric/Behavioral: Negative for decreased concentration. The patient is not hyperactive.   Knee pain    Objective:   Physical Exam BP 112/78  Pulse 80  Temp 97.9 F (36.6 C) (Oral)  Ht 5\' 4"  (1.626 m)  Wt 179 lb (81.194 kg)   BMI 30.73 kg/m2  SpO2 97% Physical Exam  VS noted Constitutional: Pt appears well-developed and well-nourished.  HENT: Head: Normocephalic.  Right Ear: External ear normal.  Left Ear: External ear normal.  Eyes: Conjunctivae and EOM are normal. Pupils are equal, round, and reactive to light.  Neck: Normal range of motion. Neck supple.  Cardiovascular: Normal rate and regular rhythm.   Pulmonary/Chest: Effort normal and breath sounds normal. no rales or wheezing Abd:  Soft, NT, non-distended, + BS Neurological: Pt is alert. No cranial nerve deficit.  Skin: Skin is warm. No erythema.  Psychiatric: Pt behavior is normal. Thought content normal.  L b anserina is tender to palp CT angio - ok ECHO - OK for age  Lab Results  Component Value Date   WBC 4.4* 04/13/2011   HGB 14.4 04/13/2011   HCT 42.7 04/13/2011   PLT 220.0 04/13/2011   GLUCOSE 102* 12/15/2011   CHOL 174 05/11/2010   TRIG 78.0 05/11/2010   HDL 52.30 05/11/2010   LDLDIRECT 130.8 09/30/2006   LDLCALC 106* 05/11/2010   ALT 15 12/15/2011   AST 21 12/15/2011   NA 136 12/15/2011   K 4.4 12/15/2011   CL 101 12/15/2011   CREATININE 1.0 12/15/2011   BUN 18 12/15/2011   CO2 28 12/15/2011   TSH 1.07 12/15/2011   INR 1.00 12/13/2009   HGBA1C 5.9 01/29/2010       Assessment & Plan:

## 2011-12-20 NOTE — Assessment & Plan Note (Signed)
B anserina bursitis/OA Ortho ref

## 2011-12-20 NOTE — Assessment & Plan Note (Signed)
Slowly improving

## 2011-12-20 NOTE — Assessment & Plan Note (Signed)
UA

## 2011-12-23 DIAGNOSIS — J309 Allergic rhinitis, unspecified: Secondary | ICD-10-CM | POA: Diagnosis not present

## 2011-12-24 DIAGNOSIS — M76899 Other specified enthesopathies of unspecified lower limb, excluding foot: Secondary | ICD-10-CM | POA: Diagnosis not present

## 2011-12-28 ENCOUNTER — Ambulatory Visit: Payer: Medicare Other | Admitting: Psychology

## 2011-12-29 DIAGNOSIS — J309 Allergic rhinitis, unspecified: Secondary | ICD-10-CM | POA: Diagnosis not present

## 2012-01-03 ENCOUNTER — Ambulatory Visit: Payer: Medicare Other | Admitting: Psychology

## 2012-01-05 DIAGNOSIS — J309 Allergic rhinitis, unspecified: Secondary | ICD-10-CM | POA: Diagnosis not present

## 2012-01-10 ENCOUNTER — Ambulatory Visit (INDEPENDENT_AMBULATORY_CARE_PROVIDER_SITE_OTHER): Payer: Medicare Other | Admitting: Psychology

## 2012-01-10 DIAGNOSIS — F331 Major depressive disorder, recurrent, moderate: Secondary | ICD-10-CM

## 2012-01-10 DIAGNOSIS — J309 Allergic rhinitis, unspecified: Secondary | ICD-10-CM | POA: Diagnosis not present

## 2012-01-11 ENCOUNTER — Encounter: Payer: Self-pay | Admitting: Internal Medicine

## 2012-01-12 DIAGNOSIS — J309 Allergic rhinitis, unspecified: Secondary | ICD-10-CM | POA: Diagnosis not present

## 2012-01-14 ENCOUNTER — Other Ambulatory Visit (HOSPITAL_COMMUNITY): Payer: Self-pay | Admitting: Psychiatry

## 2012-01-14 DIAGNOSIS — F329 Major depressive disorder, single episode, unspecified: Secondary | ICD-10-CM

## 2012-01-17 ENCOUNTER — Ambulatory Visit: Payer: Medicare Other | Admitting: Psychology

## 2012-01-18 ENCOUNTER — Other Ambulatory Visit (HOSPITAL_COMMUNITY): Payer: Self-pay | Admitting: *Deleted

## 2012-01-18 DIAGNOSIS — J309 Allergic rhinitis, unspecified: Secondary | ICD-10-CM | POA: Diagnosis not present

## 2012-01-18 DIAGNOSIS — F329 Major depressive disorder, single episode, unspecified: Secondary | ICD-10-CM

## 2012-01-18 MED ORDER — LORAZEPAM 0.5 MG PO TABS: 0.5000 mg | ORAL_TABLET | Freq: Every day | ORAL | Status: DC | PRN

## 2012-01-18 NOTE — Telephone Encounter (Signed)
Was phoned to [facility] 9/23. EPIC incorrectly showed RX as "Printed". Corrected to "Phone In"

## 2012-01-19 DIAGNOSIS — M79609 Pain in unspecified limb: Secondary | ICD-10-CM | POA: Diagnosis not present

## 2012-01-20 ENCOUNTER — Other Ambulatory Visit (HOSPITAL_COMMUNITY): Payer: Self-pay | Admitting: Orthopaedic Surgery

## 2012-01-20 DIAGNOSIS — M79605 Pain in left leg: Secondary | ICD-10-CM

## 2012-01-20 DIAGNOSIS — Z853 Personal history of malignant neoplasm of breast: Secondary | ICD-10-CM

## 2012-01-20 DIAGNOSIS — R29898 Other symptoms and signs involving the musculoskeletal system: Secondary | ICD-10-CM

## 2012-01-24 ENCOUNTER — Ambulatory Visit (INDEPENDENT_AMBULATORY_CARE_PROVIDER_SITE_OTHER): Payer: Medicare Other | Admitting: Psychology

## 2012-01-24 DIAGNOSIS — F331 Major depressive disorder, recurrent, moderate: Secondary | ICD-10-CM

## 2012-01-31 ENCOUNTER — Encounter (HOSPITAL_COMMUNITY)
Admission: RE | Admit: 2012-01-31 | Discharge: 2012-01-31 | Disposition: A | Discharge status: Home / Self Care | Payer: Medicare Other | Source: Ambulatory Visit | Attending: Orthopaedic Surgery | Admitting: Orthopaedic Surgery

## 2012-01-31 DIAGNOSIS — R29898 Other symptoms and signs involving the musculoskeletal system: Secondary | ICD-10-CM | POA: Insufficient documentation

## 2012-01-31 DIAGNOSIS — Z853 Personal history of malignant neoplasm of breast: Secondary | ICD-10-CM | POA: Insufficient documentation

## 2012-01-31 DIAGNOSIS — M79609 Pain in unspecified limb: Secondary | ICD-10-CM | POA: Diagnosis not present

## 2012-01-31 DIAGNOSIS — R079 Chest pain, unspecified: Secondary | ICD-10-CM | POA: Diagnosis not present

## 2012-01-31 DIAGNOSIS — M79605 Pain in left leg: Secondary | ICD-10-CM

## 2012-01-31 MED ORDER — TECHNETIUM TC 99M MEDRONATE IV KIT
25.0000 | PACK | Freq: Once | INTRAVENOUS | Status: AC | PRN
  Administered 2012-01-31: 25 via INTRAVENOUS

## 2012-02-01 ENCOUNTER — Ambulatory Visit: Payer: Medicare Other | Admitting: Psychology

## 2012-02-01 ENCOUNTER — Other Ambulatory Visit: Payer: Self-pay | Admitting: Internal Medicine

## 2012-02-07 ENCOUNTER — Ambulatory Visit: Payer: Medicare Other | Admitting: Psychology

## 2012-02-08 DIAGNOSIS — Z961 Presence of intraocular lens: Secondary | ICD-10-CM | POA: Diagnosis not present

## 2012-02-08 DIAGNOSIS — H4011X0 Primary open-angle glaucoma, stage unspecified: Secondary | ICD-10-CM | POA: Diagnosis not present

## 2012-02-08 DIAGNOSIS — H43819 Vitreous degeneration, unspecified eye: Secondary | ICD-10-CM | POA: Diagnosis not present

## 2012-02-08 DIAGNOSIS — J309 Allergic rhinitis, unspecified: Secondary | ICD-10-CM | POA: Diagnosis not present

## 2012-02-09 DIAGNOSIS — M25569 Pain in unspecified knee: Secondary | ICD-10-CM | POA: Diagnosis not present

## 2012-02-14 ENCOUNTER — Ambulatory Visit (INDEPENDENT_AMBULATORY_CARE_PROVIDER_SITE_OTHER): Payer: Medicare Other | Admitting: Psychology

## 2012-02-14 DIAGNOSIS — F331 Major depressive disorder, recurrent, moderate: Secondary | ICD-10-CM

## 2012-02-15 ENCOUNTER — Other Ambulatory Visit: Payer: Self-pay | Admitting: Internal Medicine

## 2012-02-15 DIAGNOSIS — J301 Allergic rhinitis due to pollen: Secondary | ICD-10-CM | POA: Diagnosis not present

## 2012-02-15 DIAGNOSIS — J3089 Other allergic rhinitis: Secondary | ICD-10-CM | POA: Diagnosis not present

## 2012-02-21 ENCOUNTER — Ambulatory Visit: Payer: Medicare Other | Admitting: Psychology

## 2012-02-22 DIAGNOSIS — M79609 Pain in unspecified limb: Secondary | ICD-10-CM | POA: Diagnosis not present

## 2012-02-22 DIAGNOSIS — M25569 Pain in unspecified knee: Secondary | ICD-10-CM | POA: Diagnosis not present

## 2012-02-23 DIAGNOSIS — M25569 Pain in unspecified knee: Secondary | ICD-10-CM | POA: Diagnosis not present

## 2012-02-24 ENCOUNTER — Telehealth: Payer: Self-pay | Admitting: Internal Medicine

## 2012-02-24 MED ORDER — SOLIFENACIN SUCCINATE 5 MG PO TABS: 10.0000 mg | ORAL_TABLET | Freq: Every day | ORAL | Status: DC

## 2012-02-24 NOTE — Telephone Encounter (Signed)
Caller: [name]/Patient; Phone: [phone]; Reason for Call: Caller: [name]/Patient; Patient Name: [name]; PCP: Sonda Primes (Adults only); Best Callback Phone Number: [phone]  Patient states she was prescribed Detrol LA for bladder control problems.  Patient states that Detrol is not covered by Medicare Part D.  Patient states that [name] is covered by Medicare Part D.   PATIENT REQUESTING THAT RX FOR DETROL BE CHANGED TO VESICARE DUE TO MEDICARE COVERAGE.  PATIENT USES [facility] [address] AT [phone].  PATIENT STATES SHE RECEIVES ONE MONTH SUPPLY OF MEDICATION.  PLEASE RETURN CALL TO PATIENT AT [phone] TO INFORM HER IF RX HAS BEEN CHANGED.

## 2012-02-24 NOTE — Telephone Encounter (Signed)
OK. Thx

## 2012-02-25 NOTE — Telephone Encounter (Signed)
Pt informed rx for Vesicare sent to [facility].

## 2012-02-28 ENCOUNTER — Telehealth: Payer: Self-pay | Admitting: Internal Medicine

## 2012-02-28 ENCOUNTER — Ambulatory Visit: Payer: Medicare Other | Admitting: Psychology

## 2012-02-28 NOTE — Telephone Encounter (Signed)
Pt. states that she sweated a lot throughout the night last night and that she has been hearing a "fluttering" in her left ear. Pt. Is advised to contact her pcp and that this note will be forwarded to Dr. Graciela Husbands as pt. Is concerned that s/s are cardiac related. Please advise.

## 2012-02-28 NOTE — Telephone Encounter (Signed)
plz return call to patient [phone] regarding lots of sweating during the night.  She is very concerned

## 2012-03-01 DIAGNOSIS — M25569 Pain in unspecified knee: Secondary | ICD-10-CM | POA: Diagnosis not present

## 2012-03-01 DIAGNOSIS — M79609 Pain in unspecified limb: Secondary | ICD-10-CM | POA: Diagnosis not present

## 2012-03-02 ENCOUNTER — Telehealth: Payer: Self-pay | Admitting: *Deleted

## 2012-03-02 NOTE — Telephone Encounter (Signed)
Left msg on triage stating pharmacy states her bladder medication needing PA. They have fax md haven't heard anything from office. Pt requesting call back concerning PA...Raechel Chute

## 2012-03-03 ENCOUNTER — Telehealth: Payer: Self-pay | Admitting: Internal Medicine

## 2012-03-03 DIAGNOSIS — J309 Allergic rhinitis, unspecified: Secondary | ICD-10-CM | POA: Diagnosis not present

## 2012-03-03 DIAGNOSIS — M79609 Pain in unspecified limb: Secondary | ICD-10-CM | POA: Diagnosis not present

## 2012-03-03 DIAGNOSIS — M25569 Pain in unspecified knee: Secondary | ICD-10-CM | POA: Diagnosis not present

## 2012-03-03 NOTE — Telephone Encounter (Signed)
Completed form faxed to ins. Pt informed

## 2012-03-03 NOTE — Telephone Encounter (Signed)
Caller: Terrian/Patient; Patient Name: [name]; PCP: Plotnikov, Alex (Adults only); Best Callback Phone Number: [phone].  Patient calling about preauthorization for her bladder control medication.  States requested prior auth 02/25/12 but still has not been completed.  States has been buying two or three tabs at a time, but this costs a lot.  Would like PA completed as soon as possible.  Info to office for staff review/follow up.   Uses [facility]. May reach patient at [phone].

## 2012-03-03 NOTE — Telephone Encounter (Signed)
We're aware of the potential for the heart to flutter. The more important concern is the sweating and I agree that her PCP would be a good person to help with this.

## 2012-03-06 ENCOUNTER — Ambulatory Visit (INDEPENDENT_AMBULATORY_CARE_PROVIDER_SITE_OTHER): Payer: Medicare Other | Admitting: Psychology

## 2012-03-06 DIAGNOSIS — F331 Major depressive disorder, recurrent, moderate: Secondary | ICD-10-CM | POA: Diagnosis not present

## 2012-03-06 NOTE — Telephone Encounter (Signed)
Complete PA form was faxed on 03/03/12. Pt informed waiting on ins notification.

## 2012-03-06 NOTE — Telephone Encounter (Signed)
[name] states Vesicare was called in but her insurance company required prior authorization.  Per EPIC, PA form was faxed to the insurance company on 03/03/12.  Pharmacy told pt that [employer] has not approved it.  States Oxybutynin does not require prior authorization.  Requesting for Oxybutynin to be called in instead.  [facility].  Office, please f/u with pt regarding script request.

## 2012-03-07 MED ORDER — OXYBUTYNIN CHLORIDE 5 MG PO TABS: 5.0000 mg | ORAL_TABLET | Freq: Three times a day (TID) | ORAL | Status: DC

## 2012-03-07 NOTE — Telephone Encounter (Signed)
Ok Thx 

## 2012-03-07 NOTE — Telephone Encounter (Signed)
Dr. Posey Rea- Vesicare 5 mg bid isnt covered. 10 mg 1 qd is acceptable. Is this ok? If not, I have a form that needs your attn re: this.

## 2012-03-08 ENCOUNTER — Telehealth: Payer: Self-pay | Admitting: Internal Medicine

## 2012-03-08 DIAGNOSIS — J309 Allergic rhinitis, unspecified: Secondary | ICD-10-CM | POA: Diagnosis not present

## 2012-03-08 DIAGNOSIS — M25569 Pain in unspecified knee: Secondary | ICD-10-CM | POA: Diagnosis not present

## 2012-03-08 NOTE — Telephone Encounter (Signed)
Pt informed- she wants to take Vesicare 10 mg 1 qd. She will call me back when she wants me to call it in.

## 2012-03-08 NOTE — Telephone Encounter (Signed)
Patient calling back for [facility].  Please call her at [phone].  She picked up the Oxybutynin yesterday at the pharmacy.

## 2012-03-13 ENCOUNTER — Ambulatory Visit: Payer: Medicare Other | Admitting: Psychology

## 2012-03-15 DIAGNOSIS — M79609 Pain in unspecified limb: Secondary | ICD-10-CM | POA: Diagnosis not present

## 2012-03-15 DIAGNOSIS — M25569 Pain in unspecified knee: Secondary | ICD-10-CM | POA: Diagnosis not present

## 2012-03-17 DIAGNOSIS — M79609 Pain in unspecified limb: Secondary | ICD-10-CM | POA: Diagnosis not present

## 2012-03-17 DIAGNOSIS — M25569 Pain in unspecified knee: Secondary | ICD-10-CM | POA: Diagnosis not present

## 2012-03-20 ENCOUNTER — Ambulatory Visit (INDEPENDENT_AMBULATORY_CARE_PROVIDER_SITE_OTHER): Payer: Medicare Other | Admitting: Psychology

## 2012-03-20 DIAGNOSIS — F331 Major depressive disorder, recurrent, moderate: Secondary | ICD-10-CM

## 2012-03-20 DIAGNOSIS — M79609 Pain in unspecified limb: Secondary | ICD-10-CM | POA: Diagnosis not present

## 2012-03-20 DIAGNOSIS — M25569 Pain in unspecified knee: Secondary | ICD-10-CM | POA: Diagnosis not present

## 2012-03-21 ENCOUNTER — Ambulatory Visit: Payer: Medicare Other | Admitting: Internal Medicine

## 2012-03-27 ENCOUNTER — Ambulatory Visit (HOSPITAL_COMMUNITY): Payer: Medicare Other | Admitting: Psychiatry

## 2012-03-27 ENCOUNTER — Ambulatory Visit: Payer: Medicare Other | Admitting: Psychology

## 2012-03-28 ENCOUNTER — Encounter: Payer: Self-pay | Admitting: Internal Medicine

## 2012-03-28 ENCOUNTER — Ambulatory Visit (INDEPENDENT_AMBULATORY_CARE_PROVIDER_SITE_OTHER): Payer: Medicare Other | Admitting: Internal Medicine

## 2012-03-28 VITALS — BP 140/98 | HR 80 | Temp 97.4°F | Resp 16 | Wt 178.0 lb

## 2012-03-28 DIAGNOSIS — F411 Generalized anxiety disorder: Secondary | ICD-10-CM | POA: Diagnosis not present

## 2012-03-28 DIAGNOSIS — M25569 Pain in unspecified knee: Secondary | ICD-10-CM | POA: Diagnosis not present

## 2012-03-28 DIAGNOSIS — M79609 Pain in unspecified limb: Secondary | ICD-10-CM | POA: Diagnosis not present

## 2012-03-28 DIAGNOSIS — M109 Gout, unspecified: Secondary | ICD-10-CM

## 2012-03-28 DIAGNOSIS — I10 Essential (primary) hypertension: Secondary | ICD-10-CM

## 2012-03-28 DIAGNOSIS — R202 Paresthesia of skin: Secondary | ICD-10-CM

## 2012-03-28 DIAGNOSIS — F329 Major depressive disorder, single episode, unspecified: Secondary | ICD-10-CM

## 2012-03-28 DIAGNOSIS — E039 Hypothyroidism, unspecified: Secondary | ICD-10-CM | POA: Diagnosis not present

## 2012-03-28 DIAGNOSIS — R209 Unspecified disturbances of skin sensation: Secondary | ICD-10-CM

## 2012-03-28 NOTE — Assessment & Plan Note (Signed)
Continue with current prescription therapy as reflected on the Med list.  

## 2012-03-28 NOTE — Assessment & Plan Note (Signed)
No relapse 

## 2012-03-28 NOTE — Progress Notes (Signed)
Subjective:    Patient ID: [name], female    DOB: 12/28/1929, 78 y.o.   MRN: [id number]  HPI      The patient is here to follow up on chronic IBS, GERD,  depression, anxiety, SOB symptoms controlled with medicines C/o more IBS sx's, stress    Wt Readings from Last 3 Encounters:  03/28/12 178 lb (80.74 kg)  12/20/11 179 lb (81.194 kg)  12/20/11 179 lb (81.194 kg)   BP Readings from Last 3 Encounters:  03/28/12 140/98  12/20/11 112/78  12/20/11 112/78      Past Medical History  Diagnosis Date  . Thyroid disease   . Glaucoma   . IBS (irritable bowel syndrome)   . Incontinence   . GERD (gastroesophageal reflux disease)   . Hypertension   . Anxiety   . Depression     Dr Dellia Cloud  . HX: breast cancer   . Osteoarthritis   . Osteopenia   . Bradycardia   . IBS (irritable bowel syndrome)   . Discoid lupus     skin  . Pacemaker     Brady//Chronotropic incompetence with normal pacemaker function   Past Surgical History  Procedure Date  . Mastectomy     B  . Pacemaker placement 2009  . Foot surgery     Left    reports that she has never smoked. She does not have any smokeless tobacco history on file. She reports that she does not drink alcohol or use illicit drugs. family history includes Cancer in her brother and mother; Heart disease in her brother and father; and Stroke in her brother and sister. Allergies  Allergen Reactions  . Amlodipine Besy-Benazepril Hcl   . Amlodipine Besylate   . Clonidine Hydrochloride     REACTION: tired  . Valsartan   . Verapamil    Current Outpatient Prescriptions on File Prior to Visit  Medication Sig Dispense Refill  . brimonidine (ALPHAGAN P) 0.1 % SOLN 2 (two) times daily.        . Cetirizine HCl (ZYRTEC ALLERGY) 10 MG CAPS Take by mouth.        . Cholecalciferol (VITAMIN D) 1000 UNITS capsule Take 1,000 Units by mouth daily.        [name] escitalopram (LEXAPRO) 10 MG tablet Take 1 tablet (10 mg total) by mouth daily.  30  tablet  2  . levothyroxine (SYNTHROID, LEVOTHROID) 100 MCG tablet TAKE ONE TABLET BY MOUTH EVERY DAY  90 tablet  1  . LORazepam (ATIVAN) 0.5 MG tablet Take 1 tablet (0.5 mg total) by mouth daily as needed for anxiety.  30 tablet  0  . Multiple Vitamin (MULTIVITAMIN) tablet Take 1 tablet by mouth daily.        . NON FORMULARY Allergy shots-every other week       . oxybutynin (DITROPAN) 5 MG tablet Take 1 tablet (5 mg total) by mouth 3 (three) times daily.  90 tablet  3  . pindolol (VISKEN) 5 MG tablet TAKE ONE TABLET BY MOUTH TWICE DAILY  180 tablet  0  . Potassium Gluconate 550 MG TABS Take 1 tablet (550 mg total) by mouth daily.      . Psyllium (METAMUCIL) 30.9 % POWD Take by mouth.        . RABEprazole (ACIPHEX) 20 MG tablet Take 20 mg by mouth daily.        . [DISCONTINUED] allopurinol (ZYLOPRIM) 300 MG tablet TAKE 1 TABLET BY MOUTH EVERY DAY  30 tablet  6  . [DISCONTINUED] spironolactone (ALDACTONE) 25 MG tablet TAKE ONE TABLET BY MOUTH EVERY DAY  30 tablet  4  . albuterol (PROVENTIL HFA;VENTOLIN HFA) 108 (90 BASE) MCG/ACT inhaler Inhale 2 puffs into the lungs every 6 (six) hours as needed for wheezing.  1 Inhaler  5  . furosemide (LASIX) 20 MG tablet Take 1 tablet (20 mg total) by mouth daily.  30 tablet  11   Review of Systems Review of Systems  Constitutional: Negative for diaphoresis and unexpected weight change.  HENT: Negative for drooling and tinnitus.   Eyes: Negative for photophobia and visual disturbance.  Respiratory: Negative for choking and stridor.   Gastrointestinal: Negative for vomiting and blood in stool.  Genitourinary: Negative for hematuria and decreased urine volume.  Musculoskeletal: Negative for gait problem.  Skin: Negative for color change and wound.  Neurological: Negative for tremors and numbness.  Psychiatric/Behavioral: Negative for decreased concentration. The patient is not hyperactive.   Knee pain    Objective:   Physical Exam BP 140/98  Pulse 80   Temp 97.4 F (36.3 C) (Oral)  Resp 16  Wt 178 lb (80.74 kg) Physical Exam  VS noted Constitutional: Pt appears well-developed and well-nourished.  HENT: Head: Normocephalic.  Right Ear: External ear normal.  Left Ear: External ear normal.  Eyes: Conjunctivae and EOM are normal. Pupils are equal, round, and reactive to light.  Neck: Normal range of motion. Neck supple.  Cardiovascular: Normal rate and regular rhythm.   Pulmonary/Chest: Effort normal and breath sounds normal. no rales or wheezing Abd:  Soft, NT, non-distended, + BS Neurological: Pt is alert. No cranial nerve deficit.  Skin: Skin is warm. No erythema.  Psychiatric: Pt behavior is normal. Thought content normal.  L b anserina is tender to palp CT angio - ok ECHO - OK for age  Lab Results  Component Value Date   WBC 4.4* 04/13/2011   HGB 14.4 04/13/2011   HCT 42.7 04/13/2011   PLT 220.0 04/13/2011   GLUCOSE 102* 12/15/2011   CHOL 174 05/11/2010   TRIG 78.0 05/11/2010   HDL 52.30 05/11/2010   LDLDIRECT 130.8 09/30/2006   LDLCALC 106* 05/11/2010   ALT 15 12/15/2011   AST 21 12/15/2011   NA 136 12/15/2011   K 4.4 12/15/2011   CL 101 12/15/2011   CREATININE 1.0 12/15/2011   BUN 18 12/15/2011   CO2 28 12/15/2011   TSH 1.07 12/15/2011   INR 1.00 12/13/2009   HGBA1C 5.9 01/29/2010       Assessment & Plan:

## 2012-03-30 DIAGNOSIS — M79609 Pain in unspecified limb: Secondary | ICD-10-CM | POA: Diagnosis not present

## 2012-03-30 DIAGNOSIS — M25569 Pain in unspecified knee: Secondary | ICD-10-CM | POA: Diagnosis not present

## 2012-03-31 ENCOUNTER — Emergency Department (HOSPITAL_COMMUNITY)
Admission: EM | Admit: 2012-03-31 | Discharge: 2012-04-01 | Disposition: A | Discharge status: Home / Self Care | Payer: Medicare Other | Attending: Emergency Medicine | Admitting: Emergency Medicine

## 2012-03-31 ENCOUNTER — Encounter (HOSPITAL_COMMUNITY): Payer: Self-pay | Admitting: Family Medicine

## 2012-03-31 ENCOUNTER — Telehealth: Payer: Self-pay | Admitting: Internal Medicine

## 2012-03-31 DIAGNOSIS — F3289 Other specified depressive episodes: Secondary | ICD-10-CM | POA: Insufficient documentation

## 2012-03-31 DIAGNOSIS — I10 Essential (primary) hypertension: Secondary | ICD-10-CM | POA: Insufficient documentation

## 2012-03-31 DIAGNOSIS — Z8739 Personal history of other diseases of the musculoskeletal system and connective tissue: Secondary | ICD-10-CM | POA: Insufficient documentation

## 2012-03-31 DIAGNOSIS — Z872 Personal history of diseases of the skin and subcutaneous tissue: Secondary | ICD-10-CM | POA: Diagnosis not present

## 2012-03-31 DIAGNOSIS — F411 Generalized anxiety disorder: Secondary | ICD-10-CM | POA: Insufficient documentation

## 2012-03-31 DIAGNOSIS — E079 Disorder of thyroid, unspecified: Secondary | ICD-10-CM | POA: Diagnosis not present

## 2012-03-31 DIAGNOSIS — F329 Major depressive disorder, single episode, unspecified: Secondary | ICD-10-CM | POA: Insufficient documentation

## 2012-03-31 DIAGNOSIS — M791 Myalgia, unspecified site: Secondary | ICD-10-CM

## 2012-03-31 DIAGNOSIS — R109 Unspecified abdominal pain: Secondary | ICD-10-CM | POA: Diagnosis not present

## 2012-03-31 DIAGNOSIS — Z87448 Personal history of other diseases of urinary system: Secondary | ICD-10-CM | POA: Diagnosis not present

## 2012-03-31 DIAGNOSIS — Z79899 Other long term (current) drug therapy: Secondary | ICD-10-CM | POA: Insufficient documentation

## 2012-03-31 DIAGNOSIS — Z853 Personal history of malignant neoplasm of breast: Secondary | ICD-10-CM | POA: Diagnosis not present

## 2012-03-31 DIAGNOSIS — N390 Urinary tract infection, site not specified: Secondary | ICD-10-CM | POA: Diagnosis not present

## 2012-03-31 DIAGNOSIS — Z8669 Personal history of other diseases of the nervous system and sense organs: Secondary | ICD-10-CM | POA: Diagnosis not present

## 2012-03-31 DIAGNOSIS — Z950 Presence of cardiac pacemaker: Secondary | ICD-10-CM | POA: Insufficient documentation

## 2012-03-31 DIAGNOSIS — Z8719 Personal history of other diseases of the digestive system: Secondary | ICD-10-CM | POA: Diagnosis not present

## 2012-03-31 DIAGNOSIS — IMO0001 Reserved for inherently not codable concepts without codable children: Secondary | ICD-10-CM | POA: Insufficient documentation

## 2012-03-31 LAB — URINALYSIS, ROUTINE W REFLEX MICROSCOPIC
Nitrite: NEGATIVE
Specific Gravity, Urine: 1.024 (ref 1.005–1.030)
Urobilinogen, UA: 1 mg/dL (ref 0.0–1.0)
pH: 8 (ref 5.0–8.0)

## 2012-03-31 LAB — URINE MICROSCOPIC-ADD ON

## 2012-03-31 MED ORDER — SULFAMETHOXAZOLE-TMP DS 800-160 MG PO TABS
1.0000 | ORAL_TABLET | Freq: Once | ORAL | Status: AC
  Administered 2012-03-31: 1 via ORAL
  Filled 2012-03-31 (×2): qty 1

## 2012-03-31 MED ORDER — OXYCODONE-ACETAMINOPHEN 5-325 MG PO TABS
1.0000 | ORAL_TABLET | Freq: Once | ORAL | Status: AC
  Administered 2012-03-31: 1 via ORAL
  Filled 2012-03-31: qty 1

## 2012-03-31 MED ORDER — SULFAMETHOXAZOLE-TRIMETHOPRIM 800-160 MG PO TABS: 1.0000 | ORAL_TABLET | Freq: Two times a day (BID) | ORAL | Status: DC

## 2012-03-31 MED ORDER — OXYCODONE-ACETAMINOPHEN 5-325 MG PO TABS: 2.0000 | ORAL_TABLET | ORAL | Status: DC | PRN

## 2012-03-31 MED ORDER — OXYCODONE-ACETAMINOPHEN 5-325 MG PO TABS
1.0000 | ORAL_TABLET | Freq: Once | ORAL | Status: AC
  Administered 2012-03-31: 1 via ORAL
  Filled 2012-03-31 (×2): qty 1

## 2012-03-31 NOTE — ED Notes (Signed)
Per pt left side pain since last night. Denies N,V,D. Denies injury. sts the pain is worse with movement. Pt currently doing physical therapy. Denies chest pain, SOB.

## 2012-03-31 NOTE — Telephone Encounter (Signed)
Patient Information:  Caller Name: Nneoma  Phone: [phone]  Patient: [name], [name]  Gender: Female  DOB: 23-Oct-1929  Age: 78 Years  PCP: Plotnikov, Alex (Adults only)   Symptoms  Reason For Call & Symptoms: c/o left side pain.  Hurts when she breathes deep.  blood pressure 134/82 03/31/12 1400.  Spoke with cardiologists office, and they told her it was not cardiac.  Patient is short of breath but states this is due to the pain.  Reviewed Health History In EMR: Yes  Reviewed Medications In EMR: Yes  Reviewed Allergies In EMR: Yes  Reviewed Surgeries / Procedures: Yes  Date of Onset of Symptoms: 03/30/2012  Guideline(s) Used:  Abdominal Pain - Female  Abdominal Pain - Upper  Disposition Per Guideline:   Go to ED Now  Reason For Disposition Reached:   Pain lasting > 10 minutes and over 62 years old  Advice Given:  N/A  Office Follow Up:  Does the office need to follow up with this patient?: No  Instructions For The Office: N/A  RN Note:  Per protocol, advised ED now; no appts in Epic available in office, so to ED; patient agrees.  Info to office.  krs/can

## 2012-03-31 NOTE — Telephone Encounter (Signed)
Noted. Thx.

## 2012-04-02 LAB — URINE CULTURE

## 2012-04-02 NOTE — ED Provider Notes (Signed)
History     CSN: [id number]  Arrival date & time 03/31/12  1545   First MD Initiated Contact with Patient 03/31/12 2258      Chief Complaint  Patient presents with  . Flank Pain    (Consider location/radiation/quality/duration/timing/severity/associated sxs/prior treatment) HPI 78 yo female presents to the ER from home with complaint of left flank pain since last evening.  Pain worse with movement, palpation.  No known injury or inciting event.  Pt went to physical therapy yesterday, but did not do anything new with her therapist.  No n/v/d, no urinary symptoms, no constipation.  No prior h/o same.  No rashes.  No sob, no chest pain.  Pt with h/o IBS, but today's symptoms are not like that.   Past Medical History  Diagnosis Date  . Thyroid disease   . Glaucoma   . IBS (irritable bowel syndrome)   . Incontinence   . GERD (gastroesophageal reflux disease)   . Hypertension   . Anxiety   . Depression     Dr Dellia Cloud  . HX: breast cancer   . Osteoarthritis   . Osteopenia   . Bradycardia   . IBS (irritable bowel syndrome)   . Discoid lupus     skin  . Pacemaker     Brady//Chronotropic incompetence with normal pacemaker function    Past Surgical History  Procedure Date  . Mastectomy     B  . Pacemaker placement 2009  . Foot surgery     Left    Family History  Problem Relation Age of Onset  . Cancer Mother   . Heart disease Father   . Stroke Sister   . Stroke Brother   . Cancer Brother   . Heart disease Brother     History  Substance Use Topics  . Smoking status: Never Smoker   . Smokeless tobacco: Not on file  . Alcohol Use: No    OB History    Grav Para Term Preterm Abortions TAB SAB Ect Mult Living                  Review of Systems  All other systems reviewed and are negative.    Allergies  Amlodipine besy-benazepril hcl; Amlodipine besylate; Clonidine hydrochloride; Valsartan; and Verapamil  Home Medications   Current Outpatient Rx  Name   Route  Sig  Dispense  Refill  . ALBUTEROL SULFATE HFA 108 (90 BASE) MCG/ACT IN AERS   Inhalation   Inhale 2 puffs into the lungs every 6 (six) hours as needed for wheezing.   1 Inhaler   5   . ALLOPURINOL 300 MG PO TABS   Oral   Take 300 mg by mouth daily.          [name] BRIMONIDINE TARTRATE 0.1 % OP SOLN      2 (two) times daily.          [name] CETIRIZINE HCL 10 MG PO CAPS   Oral   Take by mouth.           [name] VITAMIN D 1000 UNITS PO CAPS   Oral   Take 1,000 Units by mouth daily.           [name] EPIPEN 2-PAK 0.3 MG/0.3ML IJ DEVI   Intramuscular   Inject 1 Device into the muscle daily as needed. For allergic reaction         . ESCITALOPRAM OXALATE 10 MG PO TABS   Oral   Take 1 tablet (10  mg total) by mouth daily.   30 tablet   2   . LEVOTHYROXINE SODIUM 100 MCG PO TABS      TAKE ONE TABLET BY MOUTH EVERY DAY   90 tablet   1   . LORAZEPAM 0.5 MG PO TABS   Oral   Take 1 tablet (0.5 mg total) by mouth daily as needed for anxiety.   30 tablet   0   . ONE-DAILY MULTI VITAMINS PO TABS   Oral   Take 1 tablet by mouth daily.           . OXYBUTYNIN CHLORIDE 5 MG PO TABS   Oral   Take 1 tablet (5 mg total) by mouth 3 (three) times daily.   90 tablet   3   . PINDOLOL 5 MG PO TABS      TAKE ONE TABLET BY MOUTH TWICE DAILY   180 tablet   0   . POTASSIUM GLUCONATE 550 MG PO TABS   Oral   Take 1 tablet (550 mg total) by mouth daily.         . PSYLLIUM 30.9 % PO POWD   Oral   Take by mouth.           . RABEPRAZOLE SODIUM 20 MG PO TBEC   Oral   Take 20 mg by mouth daily.           [name] SPIRONOLACTONE 25 MG PO TABS   Oral   Take 25 mg by mouth daily.          . OXYCODONE-ACETAMINOPHEN 5-325 MG PO TABS   Oral   Take 2 tablets by mouth every 4 (four) hours as needed for pain.   20 tablet   0   . SULFAMETHOXAZOLE-TRIMETHOPRIM 800-160 MG PO TABS   Oral   Take 1 tablet by mouth every 12 (twelve) hours.   10 tablet   0     BP 119/73  Pulse 60   Temp 99.4 F (37.4 C) (Oral)  Resp 19  SpO2 100%  Physical Exam  Nursing note and vitals reviewed. Constitutional: She is oriented to person, place, and time. She appears well-developed and well-nourished.  HENT:  Head: Normocephalic and atraumatic.  Nose: Nose normal.  Mouth/Throat: Oropharynx is clear and moist.  Eyes: Conjunctivae normal and EOM are normal. Pupils are equal, round, and reactive to light.  Neck: Normal range of motion. Neck supple. No JVD present. No tracheal deviation present. No thyromegaly present.  Cardiovascular: Normal rate, regular rhythm, normal heart sounds and intact distal pulses.  Exam reveals no gallop and no friction rub.   No murmur heard. Pulmonary/Chest: Effort normal and breath sounds normal. No stridor. No respiratory distress. She has no wheezes. She has no rales. She exhibits no tenderness.  Abdominal: Soft. Bowel sounds are normal. She exhibits no distension and no mass. There is tenderness (tenderness over left flank, no anterior abd pain, no rebound no guarding). There is no rebound and no guarding.  Musculoskeletal: Normal range of motion. She exhibits no edema and no tenderness.  Lymphadenopathy:    She has no cervical adenopathy.  Neurological: She is alert and oriented to person, place, and time. She exhibits normal muscle tone. Coordination normal.  Skin: Skin is warm and dry. No rash noted. No erythema. No pallor.  Psychiatric: She has a normal mood and affect. Her behavior is normal. Judgment and thought content normal.    ED Course  Procedures (including critical care time)  Labs Reviewed  URINALYSIS, ROUTINE W REFLEX MICROSCOPIC - Abnormal; Notable for the following:    APPearance HAZY (*)     Bilirubin Urine SMALL (*)     Ketones, ur 15 (*)     Leukocytes, UA LARGE (*)     All other components within normal limits  URINE MICROSCOPIC-ADD ON - Abnormal; Notable for the following:    Squamous Epithelial / LPF FEW (*)      Bacteria, UA FEW (*)     All other components within normal limits  URINE CULTURE   No results found.   1. UTI (lower urinary tract infection)   2. Muscle pain   3. Left flank pain       MDM  78 yo female with left flank pain, but no cva tenderness, suprapubic pain, abd pain.  UTI noted, not symptomatic.  Pain seems to be musculoskeletal in origin.  Will treat for pain and uti.  Will have her f/u with her pcm this week.        Olivia Mackie, MD 04/02/12 [phone]

## 2012-04-03 ENCOUNTER — Telehealth: Payer: Self-pay | Admitting: Internal Medicine

## 2012-04-03 NOTE — Telephone Encounter (Signed)
Caller: Annell/Patient; Phone: [phone]; Reason for Call: Patient states she is calling with update from ED visit on 03/31/12.  Patient reports diagnoses from ED was Lt sided strained muscle and UTI, continues with antibiotic and improving.  Advised patient to call back if not improving or symptoms worsen.  Please f/u with patient if questions.  Thank you.

## 2012-04-03 NOTE — Telephone Encounter (Signed)
Agree. Thx 

## 2012-04-04 ENCOUNTER — Other Ambulatory Visit (HOSPITAL_COMMUNITY): Payer: Self-pay | Admitting: Psychiatry

## 2012-04-04 DIAGNOSIS — F329 Major depressive disorder, single episode, unspecified: Secondary | ICD-10-CM

## 2012-04-04 MED ORDER — LORAZEPAM 0.5 MG PO TABS: 0.5000 mg | ORAL_TABLET | Freq: Every day | ORAL | Status: DC | PRN

## 2012-04-10 ENCOUNTER — Other Ambulatory Visit (INDEPENDENT_AMBULATORY_CARE_PROVIDER_SITE_OTHER): Payer: Medicare Other

## 2012-04-10 ENCOUNTER — Telehealth: Payer: Self-pay | Admitting: Internal Medicine

## 2012-04-10 ENCOUNTER — Encounter: Payer: Self-pay | Admitting: Internal Medicine

## 2012-04-10 ENCOUNTER — Ambulatory Visit (INDEPENDENT_AMBULATORY_CARE_PROVIDER_SITE_OTHER): Payer: Medicare Other | Admitting: Internal Medicine

## 2012-04-10 ENCOUNTER — Ambulatory Visit (INDEPENDENT_AMBULATORY_CARE_PROVIDER_SITE_OTHER): Payer: Medicare Other | Admitting: Psychology

## 2012-04-10 VITALS — BP 130/88 | HR 80 | Temp 98.1°F | Resp 16 | Wt 175.0 lb

## 2012-04-10 DIAGNOSIS — E039 Hypothyroidism, unspecified: Secondary | ICD-10-CM | POA: Diagnosis not present

## 2012-04-10 DIAGNOSIS — R7309 Other abnormal glucose: Secondary | ICD-10-CM

## 2012-04-10 DIAGNOSIS — N390 Urinary tract infection, site not specified: Secondary | ICD-10-CM

## 2012-04-10 DIAGNOSIS — I10 Essential (primary) hypertension: Secondary | ICD-10-CM

## 2012-04-10 DIAGNOSIS — A09 Infectious gastroenteritis and colitis, unspecified: Secondary | ICD-10-CM

## 2012-04-10 DIAGNOSIS — R1310 Dysphagia, unspecified: Secondary | ICD-10-CM

## 2012-04-10 DIAGNOSIS — F331 Major depressive disorder, recurrent, moderate: Secondary | ICD-10-CM | POA: Diagnosis not present

## 2012-04-10 DIAGNOSIS — J309 Allergic rhinitis, unspecified: Secondary | ICD-10-CM | POA: Diagnosis not present

## 2012-04-10 LAB — URINALYSIS, ROUTINE W REFLEX MICROSCOPIC
Ketones, ur: NEGATIVE
Leukocytes, UA: NEGATIVE
Nitrite: NEGATIVE
Specific Gravity, Urine: 1.01 (ref 1.000–1.030)
Urobilinogen, UA: 0.2 (ref 0.0–1.0)
pH: 7 (ref 5.0–8.0)

## 2012-04-10 LAB — COMPREHENSIVE METABOLIC PANEL
Albumin: 4 g/dL (ref 3.5–5.2)
Alkaline Phosphatase: 74 U/L (ref 39–117)
BUN: 12 mg/dL (ref 6–23)
Calcium: 9 mg/dL (ref 8.4–10.5)
Creatinine, Ser: 1 mg/dL (ref 0.4–1.2)
Glucose, Bld: 86 mg/dL (ref 70–99)
Potassium: 4.6 mEq/L (ref 3.5–5.1)

## 2012-04-10 LAB — CBC WITH DIFFERENTIAL/PLATELET
Basophils Relative: 0.4 % (ref 0.0–3.0)
Eosinophils Relative: 2.4 % (ref 0.0–5.0)
Hemoglobin: 14.4 g/dL (ref 12.0–15.0)
Lymphocytes Relative: 36.7 % (ref 12.0–46.0)
MCV: 96.4 fl (ref 78.0–100.0)
Neutrophils Relative %: 48.6 % (ref 43.0–77.0)
RBC: 4.45 Mil/uL (ref 3.87–5.11)
WBC: 2.6 10*3/uL — ABNORMAL LOW (ref 4.5–10.5)

## 2012-04-10 NOTE — Patient Instructions (Signed)
Diarrhea Infections caused by germs (bacterial) or a virus commonly cause diarrhea. Your caregiver has determined that with time, rest and fluids, the diarrhea should improve. In general, eat normally while drinking more water than usual. Although water may prevent dehydration, it does not contain salt and minerals (electrolytes). Broths, weak tea without caffeine and oral rehydration solutions (ORS) replace fluids and electrolytes. Small amounts of fluids should be taken frequently. Large amounts at one time may not be tolerated. Plain water may be harmful in infants and the elderly. Oral rehydrating solutions (ORS) are available at pharmacies and grocery stores. ORS replace water and important electrolytes in proper proportions. Sports drinks are not as effective as ORS and may be harmful due to sugars worsening diarrhea.  ORS is especially recommended for use in children with diarrhea. As a general guideline for children, replace any new fluid losses from diarrhea and/or vomiting with ORS as follows:  If your child weighs 22 pounds or under (10 kg or less), give 60-120 mL ( -  cup or 2 - 4 ounces) of ORS for each episode of diarrheal stool or vomiting episode.  If your child weighs more than 22 pounds (more than 10 kgs), give 120-240 mL ( - 1 cup or 4 - 8 ounces) of ORS for each diarrheal stool or episode of vomiting.  While correcting for dehydration, children should eat normally. However, foods high in sugar should be avoided because this may worsen diarrhea. Large amounts of carbonated soft drinks, juice, gelatin desserts and other highly sugared drinks should be avoided.  After correction of dehydration, other liquids that are appealing to the child may be added. Children should drink small amounts of fluids frequently and fluids should be increased as tolerated. Children should drink enough fluids to keep urine clear or pale yellow.  Adults should eat normally while drinking more fluids than  usual. Drink small amounts of fluids frequently and increase as tolerated. Drink enough fluids to keep urine clear or pale yellow. Broths, weak decaffeinated tea, lemon lime soft drinks (allowed to go flat) and ORS replace fluids and electrolytes.  Avoid:  Carbonated drinks.  Juice.  Extremely hot or cold fluids.  Caffeine drinks.  Fatty, greasy foods.  Alcohol.  Tobacco.  Too much intake of anything at one time.  Gelatin desserts.  Probiotics are active cultures of beneficial bacteria. They may lessen the amount and number of diarrheal stools in adults. Probiotics can be found in yogurt with active cultures and in supplements.  Wash hands well to avoid spreading bacteria and virus.  Anti-diarrheal medications are not recommended for infants and children.  Only take over-the-counter or prescription medicines for pain, discomfort or fever as directed by your caregiver. Do not give aspirin to children because it may cause Reye's Syndrome.  For adults, ask your caregiver if you should continue all prescribed and over-the-counter medicines.  If your caregiver has given you a follow-up appointment, it is very important to keep that appointment. Not keeping the appointment could result in a chronic or permanent injury, and disability. If there is any problem keeping the appointment, you must call back to this facility for assistance. SEEK IMMEDIATE MEDICAL CARE IF:   You or your child is unable to keep fluids down or other symptoms or problems become worse in spite of treatment.  Vomiting or diarrhea develops and becomes persistent.  There is vomiting of blood or bile (green material).  There is blood in the stool or the stools are black and   tarry.  There is no urine output in 6-8 hours or there is only a small amount of very dark urine.  Abdominal pain develops, increases or localizes.  You have a fever.  Your baby is older than 3 months with a rectal temperature of 102 F  (38.9 C) or higher.  Your baby is 3 months old or younger with a rectal temperature of 100.4 F (38 C) or higher.  You or your child develops excessive weakness, dizziness, fainting or extreme thirst.  You or your child develops a rash, stiff neck, severe headache or become irritable or sleepy and difficult to awaken. MAKE SURE YOU:   Understand these instructions.  Will watch your condition.  Will get help right away if you are not doing well or get worse. Document Released: 04/02/2002 Document Revised: 07/05/2011 Document Reviewed: 02/17/2009 [employer] Patient Information 2013 [employer].  

## 2012-04-10 NOTE — Assessment & Plan Note (Signed)
I think she has viral GE but if her WBC count is elevated or her ESR is high then I will consider other causes for diarrhea such as C diff infection, IBD, etc. Today I will also check her lytes and renal function to look for dehydration.

## 2012-04-10 NOTE — Assessment & Plan Note (Signed)
I will check her TSH today and will adjust her dose if needed 

## 2012-04-10 NOTE — Assessment & Plan Note (Signed)
I will check her a1c today to see if she has developed DM II 

## 2012-04-10 NOTE — Assessment & Plan Note (Signed)
She has no s/s, I will recheck her UA today

## 2012-04-10 NOTE — Progress Notes (Signed)
Subjective:    Patient ID: [name], female    DOB: 25-Jan-1930, 78 y.o.   MRN: [id number]  Diarrhea  This is a new problem. Episode onset: 3-4 days. The problem occurs 5 to 10 times per day. The problem has been gradually improving. The stool consistency is described as watery. Pertinent negatives include no abdominal pain, arthralgias, bloating, chills, coughing, fever, headaches, increased  flatus, myalgias, sweats, URI, vomiting or weight loss. Nothing aggravates the symptoms. Risk factors include recent antibiotic use. She has tried nothing for the symptoms. Her past medical history is significant for irritable bowel syndrome.      Review of Systems  Constitutional: Positive for fatigue. Negative for fever, chills, weight loss, diaphoresis, activity change, appetite change and unexpected weight change.  HENT: Negative.   Eyes: Negative.   Respiratory: Negative for cough, choking, chest tightness, shortness of breath, wheezing and stridor.   Cardiovascular: Negative for chest pain, palpitations and leg swelling.  Gastrointestinal: Positive for nausea and diarrhea. Negative for vomiting, abdominal pain, constipation, blood in stool, abdominal distention, anal bleeding, rectal pain, bloating and flatus.  Genitourinary: Negative.   Musculoskeletal: Negative for myalgias, back pain, joint swelling, arthralgias and gait problem.  Skin: Negative for color change, pallor and rash.  Neurological: Negative for dizziness, tremors, weakness, light-headedness and headaches.  Hematological: Negative for adenopathy. Does not bruise/bleed easily.  Psychiatric/Behavioral: Negative.        Objective:   Physical Exam  Vitals reviewed. Constitutional: She is oriented to person, place, and time. She appears well-developed and well-nourished.  Non-toxic appearance. She does not have a sickly appearance. She does not appear ill. No distress.  HENT:  Head: Normocephalic and atraumatic.  Mouth/Throat:  Oropharynx is clear and moist. No oropharyngeal exudate.  Eyes: Conjunctivae normal are normal. Right eye exhibits no discharge. Left eye exhibits no discharge. No scleral icterus.  Neck: Normal range of motion. Neck supple. No JVD present. No tracheal deviation present. No thyromegaly present.  Cardiovascular: Normal rate, regular rhythm, normal heart sounds and intact distal pulses.  Exam reveals no gallop and no friction rub.   No murmur heard. Pulmonary/Chest: Effort normal and breath sounds normal. No stridor. No respiratory distress. She has no wheezes. She has no rales. She exhibits no tenderness.  Abdominal: Soft. Bowel sounds are normal. She exhibits no distension and no mass. There is no tenderness. There is no rebound and no guarding.  Musculoskeletal: Normal range of motion. She exhibits no edema and no tenderness.  Lymphadenopathy:    She has no cervical adenopathy.  Neurological: She is oriented to person, place, and time.  Skin: Skin is warm and dry. No rash noted. She is not diaphoretic. No erythema. No pallor.  Psychiatric: She has a normal mood and affect. Her behavior is normal. Judgment and thought content normal.      Lab Results  Component Value Date   WBC 4.4* 04/13/2011   HGB 14.4 04/13/2011   HCT 42.7 04/13/2011   PLT 220.0 04/13/2011   GLUCOSE 102* 12/15/2011   CHOL 174 05/11/2010   TRIG 78.0 05/11/2010   HDL 52.30 05/11/2010   LDLDIRECT 130.8 09/30/2006   LDLCALC 106* 05/11/2010   ALT 15 12/15/2011   AST 21 12/15/2011   NA 136 12/15/2011   K 4.4 12/15/2011   CL 101 12/15/2011   CREATININE 1.0 12/15/2011   BUN 18 12/15/2011   CO2 28 12/15/2011   TSH 1.07 12/15/2011   INR 1.00 12/13/2009   HGBA1C 5.9 01/29/2010  Assessment & Plan:

## 2012-04-10 NOTE — Telephone Encounter (Signed)
Patient Information:  Caller Name: Dystany  Phone: [phone]  Patient: [name], [name]  Gender: Female  DOB: 1929-12-29  Age: 78 Years  PCP: Plotnikov, Alex (Adults only)  Office Follow Up:  Does the office need to follow up with this patient?: No  Instructions For The Office: N/A   Symptoms  Reason For Call & Symptoms: diarrhea x 1 week.  Pt reports she is weak.  Pt has nausea but no vomiting.  Reviewed Health History In EMR: Yes  Reviewed Medications In EMR: Yes  Reviewed Allergies In EMR: Yes  Reviewed Surgeries / Procedures: Yes  Date of Onset of Symptoms: 04/03/2012  Guideline(s) Used:  Diarrhea  Disposition Per Guideline:   Go to Office Now  Reason For Disposition Reached:   Age > 60 years and has had > 6 diarrhea stools in past 24 hours  Advice Given:  Fluids:  Drink more fluids, at least 8-10 glasses (8 oz or 240 ml) daily.  Nutrition:  Ideal initial foods include boiled starches/cereals (e.g., potatoes, rice, noodles, wheat, oats) with a small amount of salt to taste.  Other acceptable foods include: bananas, yogurt, crackers, soup.  Call Back If:  You become worse.  Appointment Scheduled:  04/10/2012 10:30:00 Appointment Scheduled Provider:  Sanda Linger (Adults only)  No appt was found with Dr Posey Rea

## 2012-04-10 NOTE — Assessment & Plan Note (Signed)
Her BP is well controlled today I will check her lytes and renal function

## 2012-04-11 ENCOUNTER — Other Ambulatory Visit: Payer: Medicare Other

## 2012-04-11 DIAGNOSIS — A09 Infectious gastroenteritis and colitis, unspecified: Secondary | ICD-10-CM

## 2012-04-12 ENCOUNTER — Encounter: Payer: Self-pay | Admitting: Internal Medicine

## 2012-04-12 LAB — FECAL LACTOFERRIN, QUANT: Lactoferrin: POSITIVE

## 2012-04-12 LAB — CLOSTRIDIUM DIFFICILE EIA: CDIFTX: NEGATIVE

## 2012-04-13 ENCOUNTER — Telehealth: Payer: Self-pay | Admitting: Internal Medicine

## 2012-04-13 DIAGNOSIS — R1310 Dysphagia, unspecified: Secondary | ICD-10-CM | POA: Insufficient documentation

## 2012-04-13 MED ORDER — GLYCOPYRROLATE 2 MG PO TABS: 2.0000 mg | ORAL_TABLET | Freq: Three times a day (TID) | ORAL | Status: DC

## 2012-04-13 NOTE — Telephone Encounter (Signed)
Caller: Yarethzy/Patient; Phone: [phone]; Reason for Call: Calling to let Dr.  Jonny Ruiz know that her stomach fells like the food is just sitting there, undigested.  Sort of like she feels with her IBS.   She doesn't want a referall to her gi specialist at this time.

## 2012-04-13 NOTE — Telephone Encounter (Signed)
Pt informed rx sent.

## 2012-04-13 NOTE — Telephone Encounter (Signed)
I sent in an Rx for robinul

## 2012-04-13 NOTE — Addendum Note (Signed)
Addended by: Etta Grandchild on: 04/13/2012 08:58 AM   Modules accepted: Orders

## 2012-04-18 ENCOUNTER — Encounter: Payer: Medicare Other | Admitting: Internal Medicine

## 2012-04-18 LAB — CLOSTRIDIUM DIFFICILE CULTURE-FECAL

## 2012-04-21 ENCOUNTER — Encounter: Payer: Self-pay | Admitting: Internal Medicine

## 2012-04-24 ENCOUNTER — Other Ambulatory Visit: Payer: Self-pay | Admitting: Internal Medicine

## 2012-04-24 DIAGNOSIS — R1310 Dysphagia, unspecified: Secondary | ICD-10-CM

## 2012-04-25 DIAGNOSIS — R1310 Dysphagia, unspecified: Secondary | ICD-10-CM | POA: Diagnosis not present

## 2012-04-26 ENCOUNTER — Other Ambulatory Visit (HOSPITAL_COMMUNITY): Payer: Self-pay | Admitting: Psychiatry

## 2012-04-27 ENCOUNTER — Other Ambulatory Visit (HOSPITAL_COMMUNITY): Payer: Self-pay | Admitting: Psychiatry

## 2012-04-27 DIAGNOSIS — H4011X0 Primary open-angle glaucoma, stage unspecified: Secondary | ICD-10-CM | POA: Diagnosis not present

## 2012-04-27 DIAGNOSIS — F329 Major depressive disorder, single episode, unspecified: Secondary | ICD-10-CM

## 2012-04-27 DIAGNOSIS — Z961 Presence of intraocular lens: Secondary | ICD-10-CM | POA: Diagnosis not present

## 2012-04-27 DIAGNOSIS — H43819 Vitreous degeneration, unspecified eye: Secondary | ICD-10-CM | POA: Diagnosis not present

## 2012-04-27 MED ORDER — ESCITALOPRAM OXALATE 10 MG PO TABS: 10.0000 mg | ORAL_TABLET | Freq: Every day | ORAL | Status: DC

## 2012-05-01 ENCOUNTER — Ambulatory Visit (HOSPITAL_COMMUNITY): Payer: Medicare Other | Admitting: Psychiatry

## 2012-05-04 ENCOUNTER — Encounter: Payer: Self-pay | Admitting: Internal Medicine

## 2012-05-04 DIAGNOSIS — J309 Allergic rhinitis, unspecified: Secondary | ICD-10-CM | POA: Diagnosis not present

## 2012-05-08 ENCOUNTER — Ambulatory Visit (HOSPITAL_COMMUNITY): Payer: Medicare Other | Admitting: Psychiatry

## 2012-05-10 DIAGNOSIS — J309 Allergic rhinitis, unspecified: Secondary | ICD-10-CM | POA: Diagnosis not present

## 2012-05-15 ENCOUNTER — Telehealth: Payer: Self-pay | Admitting: *Deleted

## 2012-05-15 ENCOUNTER — Ambulatory Visit (INDEPENDENT_AMBULATORY_CARE_PROVIDER_SITE_OTHER): Payer: Medicare Other | Admitting: *Deleted

## 2012-05-15 VITALS — BP 170/110

## 2012-05-15 DIAGNOSIS — I10 Essential (primary) hypertension: Secondary | ICD-10-CM

## 2012-05-15 NOTE — Telephone Encounter (Signed)
[facility] Triage Call Report Triage Record Num: [id number] Operator: Edgar Frisk Patient Name: [name] Call Date & Time: 05/15/2012 12:26:12AM Patient Phone: [phone] PCP: Sonda Primes Patient Gender: Female PCP Fax : [phone] Patient DOB: November 28, 1929 [facility] Name: [facility] Reason for Call: Caller: [name]/Patient; PCP: Sonda Primes (Adults only); CB#: [phone]; Call regarding Hypertension Pt reports she has had headache and some SOB tonight. SOB has been going on for a while and is no worse tonight. Usually worse on exertion. Tonight BP has been elevated , up to 165/101, has taken and additional Pindolol . States was told to take extra when BP was elevated. Denies emergent symptoms. Care advice per Hypertension Guideline Signs and symptoms for call back reviewed Protocol(s) Used: Hypertension, Diagnosed or Suspected Recommended Outcome per Protocol: See Provider within 24 hours Reason for Outcome: A sudden elevation in blood pressure AND has not been taking blood pressure medication as prescribed, or recently started, changed, or stopped taking prescription medication; or recently started taking nonprescription or alternative medicine Care Advice: ~ Call provider if systolic BP is 180 or greater, or if diastolic BP is 120 or greater. ~ List, or take, all current prescription(s), nonprescription or alternative medication(s) to provider for evaluation

## 2012-05-15 NOTE — Progress Notes (Signed)
Pt here for BP check. She c/o fatigue and slight HA x 1 day.  At nurse visit BP is 170/110. PCP advised of this and that she has been taking her Pindolol 5 mg bid x 2-3 days as well as Spironolactone 25 mg qd. I advised her to continue meds this way as per Dr. Posey Rea and he will probably be changing one or more of her meds and I will call her with his advisement later. I also advised her to get to ER or UC if she develops any new symptoms or current symptoms worsen.

## 2012-05-15 NOTE — Telephone Encounter (Signed)
Pt came in for BP check today. She has been taking Pindolol 1 bid and spironolactone qd and still getting elevated readings. She c/o HA and fatigue. Please advise does she need any med changes?

## 2012-05-16 ENCOUNTER — Ambulatory Visit (INDEPENDENT_AMBULATORY_CARE_PROVIDER_SITE_OTHER): Payer: Medicare Other | Admitting: Psychology

## 2012-05-16 DIAGNOSIS — F331 Major depressive disorder, recurrent, moderate: Secondary | ICD-10-CM | POA: Diagnosis not present

## 2012-05-16 DIAGNOSIS — K219 Gastro-esophageal reflux disease without esophagitis: Secondary | ICD-10-CM | POA: Diagnosis not present

## 2012-05-16 NOTE — Telephone Encounter (Signed)
Take Pindolol bid ROV  Thx

## 2012-05-17 ENCOUNTER — Other Ambulatory Visit (HOSPITAL_COMMUNITY): Payer: Self-pay | Admitting: *Deleted

## 2012-05-17 ENCOUNTER — Ambulatory Visit (HOSPITAL_COMMUNITY): Payer: Medicare Other | Admitting: Psychiatry

## 2012-05-17 DIAGNOSIS — F329 Major depressive disorder, single episode, unspecified: Secondary | ICD-10-CM

## 2012-05-17 MED ORDER — LORAZEPAM 0.5 MG PO TABS: 0.5000 mg | ORAL_TABLET | Freq: Every day | ORAL | Status: DC | PRN

## 2012-05-18 ENCOUNTER — Ambulatory Visit (INDEPENDENT_AMBULATORY_CARE_PROVIDER_SITE_OTHER): Payer: Medicare Other | Admitting: Internal Medicine

## 2012-05-18 ENCOUNTER — Encounter: Payer: Self-pay | Admitting: Internal Medicine

## 2012-05-18 VITALS — Ht 64.0 in | Wt 176.8 lb

## 2012-05-18 DIAGNOSIS — R0609 Other forms of dyspnea: Secondary | ICD-10-CM

## 2012-05-18 DIAGNOSIS — I10 Essential (primary) hypertension: Secondary | ICD-10-CM

## 2012-05-18 DIAGNOSIS — I495 Sick sinus syndrome: Secondary | ICD-10-CM

## 2012-05-18 DIAGNOSIS — R0600 Dyspnea, unspecified: Secondary | ICD-10-CM

## 2012-05-18 DIAGNOSIS — Z950 Presence of cardiac pacemaker: Secondary | ICD-10-CM | POA: Diagnosis not present

## 2012-05-18 DIAGNOSIS — I472 Ventricular tachycardia: Secondary | ICD-10-CM | POA: Diagnosis not present

## 2012-05-18 DIAGNOSIS — I4891 Unspecified atrial fibrillation: Secondary | ICD-10-CM

## 2012-05-18 DIAGNOSIS — R0989 Other specified symptoms and signs involving the circulatory and respiratory systems: Secondary | ICD-10-CM

## 2012-05-18 DIAGNOSIS — J309 Allergic rhinitis, unspecified: Secondary | ICD-10-CM | POA: Diagnosis not present

## 2012-05-18 DIAGNOSIS — I951 Orthostatic hypotension: Secondary | ICD-10-CM

## 2012-05-18 LAB — PACEMAKER DEVICE OBSERVATION
AL AMPLITUDE: 4 mv
AL IMPEDENCE PM: 474 Ohm
AL THRESHOLD: 0.75 v
ATRIAL PACING PM: 91
BAMS-0001: 175 {beats}/min
BATTERY VOLTAGE: 2.78 v
RV LEAD AMPLITUDE: 8 mv
RV LEAD IMPEDENCE PM: 528 Ohm
RV LEAD THRESHOLD: 0.5 v
VENTRICULAR PACING PM: 0

## 2012-05-18 NOTE — Assessment & Plan Note (Signed)
Dizziness is improved. Her exercise test done a year ago was notable for exercise associated hypertension

## 2012-05-18 NOTE — Progress Notes (Signed)
Patient Care Team: Tresa Garter, MD as PCP - General Cleotis Nipper, MD (Psychiatry) Griffith Citron, MD (Gastroenterology) Pricilla Riffle, MD (Cardiology) Valarie Merino, MD (General Surgery)   HPI  [name] is a 79 y.o. female seen in followup for orthostatic intolerance, exercise intolerance, and prior pacemaker implantation for sinus node dysfunction. seh complains of dyspnea on exertion  Stress ehco last year normal LV function. She's had no edema. She has no dietary indiscretion. She has no orthopnea or nocturnal dyspnea.  He is also concerned about her blood pressure being elevated she saw Dr. Wendi Maya recently in no medication adjustments were made Past Medical History  Diagnosis Date  . Thyroid disease   . Glaucoma   . IBS (irritable bowel syndrome)   . Incontinence   . GERD (gastroesophageal reflux disease)   . Hypertension   . Anxiety   . Depression     Dr Dellia Cloud  . HX: breast cancer   . Osteoarthritis   . Osteopenia   . Bradycardia   . IBS (irritable bowel syndrome)   . Discoid lupus     skin  . Pacemaker     Brady//Chronotropic incompetence with normal pacemaker function    Past Surgical History  Procedure Date  . Mastectomy     B  . Pacemaker placement 2009  . Foot surgery     Left    Current Outpatient Prescriptions  Medication Sig Dispense Refill  . allopurinol (ZYLOPRIM) 300 MG tablet Take 300 mg by mouth daily.       . brimonidine (ALPHAGAN P) 0.1 % SOLN 2 (two) times daily.       . Cetirizine HCl (ZYRTEC ALLERGY) 10 MG CAPS Take by mouth.        . EPIPEN 2-PAK 0.3 MG/0.3ML DEVI Inject 1 Device into the muscle daily as needed. For allergic reaction      . escitalopram (LEXAPRO) 10 MG tablet Take 1 tablet (10 mg total) by mouth daily.  30 tablet  0  . levothyroxine (SYNTHROID, LEVOTHROID) 100 MCG tablet TAKE ONE TABLET BY MOUTH EVERY DAY  90 tablet  1  . LORazepam (ATIVAN) 0.5 MG tablet Take 1 tablet (0.5 mg total) by mouth daily as needed  for anxiety.  30 tablet  0  . Magnesium 200 MG TABS Take 400 mg by mouth daily.      [name] oxybutynin (DITROPAN) 5 MG tablet Take 1 tablet (5 mg total) by mouth 3 (three) times daily.  90 tablet  3  . oxyCODONE-acetaminophen (PERCOCET/ROXICET) 5-325 MG per tablet Take 2 tablets by mouth every 4 (four) hours as needed for pain.  20 tablet  0  . pindolol (VISKEN) 5 MG tablet TAKE ONE TABLET BY MOUTH TWICE DAILY  180 tablet  0  . Potassium Gluconate 550 MG TABS Take 1 tablet (550 mg total) by mouth daily.      . Psyllium (METAMUCIL) 30.9 % POWD Take by mouth.        . RABEprazole (ACIPHEX) 20 MG tablet Take 20 mg by mouth 2 (two) times daily.       [name] spironolactone (ALDACTONE) 25 MG tablet Take 25 mg by mouth daily.         Allergies  Allergen Reactions  . Amlodipine Besy-Benazepril Hcl   . Amlodipine Besylate   . Clonidine Hydrochloride     REACTION: tired  . Valsartan   . Verapamil     Review of Systems negative except from HPI and  PMH  Physical Exam Ht 5\' 4"  (1.626 m)  Wt 176 lb 12.8 oz (80.196 kg)  BMI 30.35 kg/m2 Well developed and well nourished in no acute distress HENT normal E scleral and icterus clear Neck Supple JVP flat; carotids brisk and full Clear to ausculation  Regular rate and rhythm, no murmurs +S4Soft with active bowel sounds No clubbing cyanosis none Edema Alert and oriented, grossly normal motor and sensory function Skin Warm and Dry    Assessment and  Plan

## 2012-05-18 NOTE — Assessment & Plan Note (Signed)
The patient's device was interrogated and the information was fully reviewed.  The device was reprogrammed to lower the lower tracking rate from 70-60 to see if we can prevent any tachycardia associated dyspnea in the setting of what is likely hypertensive heart disease

## 2012-05-18 NOTE — Assessment & Plan Note (Signed)
90% atrial pacing.

## 2012-05-18 NOTE — Assessment & Plan Note (Signed)
As noted above  multifactorial. We'll change the rate is noted. Will not add a diuretic.

## 2012-05-18 NOTE — Assessment & Plan Note (Signed)
She is concerned about her blood pressure. She is going to taking garlic vinegar honey next she'll let us in a couple weeks our blood pressures doing.

## 2012-05-18 NOTE — Patient Instructions (Addendum)
Your physician wants you to follow-up in: 1 year with Dr Klein.  You will receive a reminder letter in the mail two months in advance. If you don't receive a letter, please call our office to schedule the follow-up appointment.  

## 2012-05-19 NOTE — Telephone Encounter (Signed)
Pt informed- she states she was seen by cardiology and they adjusted her pacemaker. She states she is feeling better today.

## 2012-05-20 ENCOUNTER — Other Ambulatory Visit: Payer: Self-pay | Admitting: Internal Medicine

## 2012-05-20 DIAGNOSIS — I10 Essential (primary) hypertension: Secondary | ICD-10-CM

## 2012-05-22 MED ORDER — POTASSIUM GLUCONATE 550 MG PO TABS: 550.0000 mg | ORAL_TABLET | Freq: Every day | ORAL | Status: DC

## 2012-05-23 ENCOUNTER — Ambulatory Visit (HOSPITAL_COMMUNITY): Payer: Medicare Other | Admitting: Psychiatry

## 2012-05-23 ENCOUNTER — Encounter (HOSPITAL_COMMUNITY): Payer: Self-pay | Admitting: Psychiatry

## 2012-05-23 ENCOUNTER — Ambulatory Visit (INDEPENDENT_AMBULATORY_CARE_PROVIDER_SITE_OTHER): Payer: Medicare Other | Admitting: Psychiatry

## 2012-05-23 VITALS — BP 146/89 | HR 75 | Wt 171.0 lb

## 2012-05-23 DIAGNOSIS — F329 Major depressive disorder, single episode, unspecified: Secondary | ICD-10-CM

## 2012-05-23 MED ORDER — ESCITALOPRAM OXALATE 10 MG PO TABS: 10.0000 mg | ORAL_TABLET | Freq: Every day | ORAL | Status: DC

## 2012-05-23 NOTE — Progress Notes (Signed)
[facility] [id number] Progress Note  [name] [id number] 79 y.o.  05/23/2012 11:26 AM  Chief Complaint: I have a lot of medical issues..  History presenting illness Patient is 79 year old African American female who was last seen in August 2013 came for her followup appointment.  Patient is seeing her primary care physician, cardiologist for recent diarrhea and hypertension.  She is very concerned about her physical health.  She endorse decreased energy and sometime tired seeing doctors and getting medication.  She is not seeing therapist and she feels old and does not need therapy.  Patient has chronic depression and she is taking medication Lexapro daily.  She denies any side effects.  She takes lorazepam only as needed and recently she has taken some.  She sleeps on and off.  Her Christmas was very quiet.  Her granddaughter was sick and she had a surgery which is getting better.  She denies any agitation anger mood swing.  She has some crying spells and feeling isolated and withdrawn but she also feels that Lexapro is helping her depression.  She is very reluctant to increase her Lexapro.  She's not drinking or using any illegal substance  Suicidal Ideation: No Plan Formed: No Patient has means to carry out plan: No  Homicidal Ideation: No Plan Formed: No Patient has means to carry out plan: No  Review of Systems: Psychiatric: Agitation: No Hallucination: No Depressed Mood: Yes Insomnia: Yes Hypersomnia: No Altered Concentration: No Feels Worthless: No Grandiose Ideas: No Belief In Special Powers: No New/Increased Substance Abuse: No Compulsions: No  Neurologic: Headache: Yes Seizure: No Paresthesias: No Review of Systems  Constitutional: Positive for malaise/fatigue.  Gastrointestinal: Positive for nausea, abdominal pain and diarrhea.  Musculoskeletal: Positive for back pain.  Psychiatric/Behavioral: Positive for depression. Negative for suicidal ideas,  hallucinations and substance abuse. The patient is nervous/anxious and has insomnia.    Past psychiatric history Patient has one history of psychiatric inpatient treatment many years ago when she was admitted due to suicidal thinking.  Patient denies any history of suicidal attempt or any psychosis.    Medical history Patient has history of hypothyroidism, vitamin D deficiency, hyperlipidemia, hypertension, allergic rhinitis, GERD, and a fissure, cystitis, chronic diarrhea, afibrillation, osteoporosis, chronic fatigue, history of breast cancer, hearing loss, irritable bladder, chronic knee pain and dysphagia.  Her primary care physician is Dr. Sheran Luz.  Social History: Patient lives alone however she has a good support system.  She has 2 son and one daughter.  Patient has several grandkids.  Outpatient Encounter Prescriptions as of 05/23/2012  Medication Sig Dispense Refill  . allopurinol (ZYLOPRIM) 300 MG tablet Take 300 mg by mouth daily.       . brimonidine (ALPHAGAN P) 0.1 % SOLN 2 (two) times daily.       . Cetirizine HCl (ZYRTEC ALLERGY) 10 MG CAPS Take by mouth.        . EPIPEN 2-PAK 0.3 MG/0.3ML DEVI Inject 1 Device into the muscle daily as needed. For allergic reaction      . escitalopram (LEXAPRO) 10 MG tablet Take 1 tablet (10 mg total) by mouth daily.  90 tablet  0  . levothyroxine (SYNTHROID, LEVOTHROID) 100 MCG tablet TAKE ONE TABLET BY MOUTH EVERY DAY  90 tablet  1  . LORazepam (ATIVAN) 0.5 MG tablet Take 1 tablet (0.5 mg total) by mouth daily as needed for anxiety.  30 tablet  0  . Magnesium 200 MG TABS Take 400 mg by mouth daily.      [name]  oxybutynin (DITROPAN) 5 MG tablet Take 1 tablet (5 mg total) by mouth 3 (three) times daily.  90 tablet  3  . oxyCODONE-acetaminophen (PERCOCET/ROXICET) 5-325 MG per tablet Take 2 tablets by mouth every 4 (four) hours as needed for pain.  20 tablet  0  . pindolol (VISKEN) 5 MG tablet TAKE ONE TABLET BY MOUTH TWICE DAILY  180 tablet  0  .  Potassium Gluconate 550 MG TABS Take 1 tablet (550 mg total) by mouth daily.  30 each  6  . Psyllium (METAMUCIL) 30.9 % POWD Take by mouth.        . RABEprazole (ACIPHEX) 20 MG tablet Take 20 mg by mouth 2 (two) times daily.       [name] spironolactone (ALDACTONE) 25 MG tablet Take 25 mg by mouth daily.       . [DISCONTINUED] escitalopram (LEXAPRO) 10 MG tablet Take 1 tablet (10 mg total) by mouth daily.  30 tablet  0    Past Psychiatric History/Hospitalization(s): Anxiety: Yes Bipolar Disorder: No Depression: Yes Mania: No Psychosis: No Schizophrenia: No Personality Disorder: No Hospitalization for psychiatric illness: Yes History of Electroconvulsive Shock Therapy: No Prior Suicide Attempts: No  Physical Exam: Constitutional:  BP 146/89  Pulse 75  Wt 171 lb (77.565 kg)  Mental status examination Patient is casually dressed and fairly groomed.  She appears anxious and her affect is mood appropriate.  Her speech is soft but clear and coherent with decreased volume and tone.  Her thought process is logical linear and goal-directed.  She denies any active or passive suicidal thoughts or homicidal thoughts but endorse chronic feeling of hopelessness.  There were no flight of ideas or any loose association.  Her attention and concentration is fair.  She denies any auditory or visual hallucination.  There were no paranoia or delusion present at this time.  Her psychomotor activity slightly decreased.  There were no tremors or shakes.  She's alert and oriented x3.  Her insight judgment and impulse control is okay.  Medical Decision Making (Choose Three): Established Problem, Stable/Improving (1), Review of Psycho-Social Stressors (1), Review or order clinical lab tests (1), Review and summation of old records (2), Review of Last Therapy Session (1) and Review of Medication Regimen & Side Effects (2)  Assessment: Axis I: Maj. depressive disorder  Axis II: Deferred  Axis III: See medical  history  Axis IV: Mild to moderate  Axis V: 60-65   Plan: I review her psychosocial stressors, recent blood work and response to the medication.  She is taking Lexapro 10 mg and lorazepam as needed.  She does not want to change her medication at this time.  I encourage her to see her therapist regularly as patient is reluctant to see him in the future.  Discussed in detail the risk and benefits of medication and prognosis of her illness.  Discussed safety plan that anytime having active suicidal thoughts or homicidal thoughts and she need to call 911 or go to local emergency room.  I will see her again in 3 months.  Time spent 25 minutes.  More than 50% of the time spent and psychoeducation counseling and coordination of care.  Claritza July T., MD 05/23/2012

## 2012-05-28 ENCOUNTER — Encounter (HOSPITAL_COMMUNITY): Payer: Self-pay | Admitting: Emergency Medicine

## 2012-05-28 ENCOUNTER — Emergency Department (HOSPITAL_COMMUNITY): Payer: Medicare Other

## 2012-05-28 ENCOUNTER — Emergency Department (HOSPITAL_COMMUNITY)
Admission: EM | Admit: 2012-05-28 | Discharge: 2012-05-28 | Disposition: A | Discharge status: Home / Self Care | Payer: Medicare Other | Attending: Emergency Medicine | Admitting: Emergency Medicine

## 2012-05-28 DIAGNOSIS — F3289 Other specified depressive episodes: Secondary | ICD-10-CM | POA: Insufficient documentation

## 2012-05-28 DIAGNOSIS — K219 Gastro-esophageal reflux disease without esophagitis: Secondary | ICD-10-CM | POA: Insufficient documentation

## 2012-05-28 DIAGNOSIS — R5381 Other malaise: Secondary | ICD-10-CM | POA: Diagnosis not present

## 2012-05-28 DIAGNOSIS — Z853 Personal history of malignant neoplasm of breast: Secondary | ICD-10-CM | POA: Insufficient documentation

## 2012-05-28 DIAGNOSIS — I10 Essential (primary) hypertension: Secondary | ICD-10-CM | POA: Diagnosis not present

## 2012-05-28 DIAGNOSIS — R197 Diarrhea, unspecified: Secondary | ICD-10-CM | POA: Insufficient documentation

## 2012-05-28 DIAGNOSIS — H40009 Preglaucoma, unspecified, unspecified eye: Secondary | ICD-10-CM | POA: Insufficient documentation

## 2012-05-28 DIAGNOSIS — F329 Major depressive disorder, single episode, unspecified: Secondary | ICD-10-CM | POA: Insufficient documentation

## 2012-05-28 DIAGNOSIS — E079 Disorder of thyroid, unspecified: Secondary | ICD-10-CM | POA: Diagnosis not present

## 2012-05-28 DIAGNOSIS — R0602 Shortness of breath: Secondary | ICD-10-CM | POA: Insufficient documentation

## 2012-05-28 DIAGNOSIS — Z872 Personal history of diseases of the skin and subcutaneous tissue: Secondary | ICD-10-CM | POA: Diagnosis not present

## 2012-05-28 DIAGNOSIS — R42 Dizziness and giddiness: Secondary | ICD-10-CM

## 2012-05-28 DIAGNOSIS — R51 Headache: Secondary | ICD-10-CM | POA: Diagnosis not present

## 2012-05-28 DIAGNOSIS — Z950 Presence of cardiac pacemaker: Secondary | ICD-10-CM | POA: Diagnosis not present

## 2012-05-28 DIAGNOSIS — R5383 Other fatigue: Secondary | ICD-10-CM | POA: Diagnosis not present

## 2012-05-28 DIAGNOSIS — Z8739 Personal history of other diseases of the musculoskeletal system and connective tissue: Secondary | ICD-10-CM | POA: Insufficient documentation

## 2012-05-28 DIAGNOSIS — Z8719 Personal history of other diseases of the digestive system: Secondary | ICD-10-CM | POA: Diagnosis not present

## 2012-05-28 DIAGNOSIS — F411 Generalized anxiety disorder: Secondary | ICD-10-CM | POA: Insufficient documentation

## 2012-05-28 DIAGNOSIS — R61 Generalized hyperhidrosis: Secondary | ICD-10-CM | POA: Insufficient documentation

## 2012-05-28 DIAGNOSIS — F29 Unspecified psychosis not due to a substance or known physiological condition: Secondary | ICD-10-CM | POA: Diagnosis not present

## 2012-05-28 DIAGNOSIS — R531 Weakness: Secondary | ICD-10-CM

## 2012-05-28 DIAGNOSIS — Z79899 Other long term (current) drug therapy: Secondary | ICD-10-CM | POA: Insufficient documentation

## 2012-05-28 LAB — COMPREHENSIVE METABOLIC PANEL
Alkaline Phosphatase: 84 U/L (ref 39–117)
BUN: 16 mg/dL (ref 6–23)
CO2: 26 mEq/L (ref 19–32)
Chloride: 100 mEq/L (ref 96–112)
Creatinine, Ser: 0.94 mg/dL (ref 0.50–1.10)
GFR calc Af Amer: 65 mL/min — ABNORMAL LOW (ref >90)
GFR calc non Af Amer: 56 mL/min — ABNORMAL LOW (ref >90)
Glucose, Bld: 94 mg/dL (ref 70–99)
Total Bilirubin: 0.4 mg/dL (ref 0.3–1.2)

## 2012-05-28 LAB — URINALYSIS, ROUTINE W REFLEX MICROSCOPIC
Ketones, ur: NEGATIVE mg/dL
Protein, ur: NEGATIVE mg/dL
Urobilinogen, UA: 0.2 mg/dL (ref 0.0–1.0)

## 2012-05-28 LAB — CBC WITH DIFFERENTIAL/PLATELET
Basophils Relative: 1 % (ref 0–1)
HCT: 43.1 % (ref 36.0–46.0)
Hemoglobin: 14.4 g/dL (ref 12.0–15.0)
Lymphocytes Relative: 35 % (ref 12–46)
Lymphs Abs: 1.5 10*3/uL (ref 0.7–4.0)
MCHC: 33.4 g/dL (ref 30.0–36.0)
Monocytes Absolute: 0.4 10*3/uL (ref 0.1–1.0)
Monocytes Relative: 8 % (ref 3–12)
Neutro Abs: 2.2 10*3/uL (ref 1.7–7.7)
RBC: 4.41 MIL/uL (ref 3.87–5.11)

## 2012-05-28 LAB — PRO B NATRIURETIC PEPTIDE: Pro B Natriuretic peptide (BNP): 230.5 pg/mL (ref 0–450)

## 2012-05-28 NOTE — ED Notes (Signed)
Pt states she felt weak and dizzy all day today while trying to clean up her apartment. Pt is tearful and emotional during assessment. Pt states her son and daughter get upset when she calls and asks them to help her with anything. When she spoke w/ her daughter this morning she requested daughter to call her every couple of hours today because she did not feel good and daughter said she would not and not to call her because she was having a [employer] party today. When [employer] arrived at door pt did not remember calling 911, [employer] called EMS to come to residence for transport d/t pt's condition at their arrival.

## 2012-05-28 NOTE — ED Provider Notes (Signed)
History     CSN: [id number]  Arrival date & time 05/28/12  [id number]   First MD Initiated Contact with Patient 05/28/12 1912      Chief Complaint  Patient presents with  . Dizziness  . Weakness    (Consider location/radiation/quality/duration/timing/severity/associated sxs/prior treatment) HPI Comments: Patient called 911 earlier because she was feeling weak and dizzy. She says she has been feeling that way all day. She felt like she was going to pass out. Please respond to the 911 call and she did not remember calling 911. She did not want to come to the ER, but EMS convinced her. She says she has had some shortness of breath. This is been going on for a while and she saw Dr. Graciela Husbands this week. He adjusted her pacemaker but it did not help. She has been having diarrhea but no vomiting. There has not been any noted fever, but she does report intermittent diaphoresis. She denies chest pain. She has not had any abdominal pain.  Patient is a 79 y.o. female presenting with weakness.  Weakness The primary symptoms include dizziness.  Dizziness also occurs with weakness and diaphoresis.   Additional symptoms include weakness.    Past Medical History  Diagnosis Date  . Thyroid disease   . Glaucoma   . IBS (irritable bowel syndrome)   . Incontinence   . GERD (gastroesophageal reflux disease)   . Hypertension   . Anxiety   . Depression     Dr Dellia Cloud  . HX: breast cancer   . Osteoarthritis   . Osteopenia   . Bradycardia   . IBS (irritable bowel syndrome)   . Discoid lupus     skin  . Pacemaker     Brady//Chronotropic incompetence with normal pacemaker function    Past Surgical History  Procedure Date  . Mastectomy     B  . Pacemaker placement 2009  . Foot surgery     Left    Family History  Problem Relation Age of Onset  . Cancer Mother   . Heart disease Father   . Stroke Sister   . Stroke Brother   . Cancer Brother   . Heart disease Brother     History  Substance  Use Topics  . Smoking status: Never Smoker   . Smokeless tobacco: Not on file  . Alcohol Use: No    OB History    Grav Para Term Preterm Abortions TAB SAB Ect Mult Living                  Review of Systems  Constitutional: Positive for diaphoresis.  Respiratory: Positive for shortness of breath.   Cardiovascular: Negative for chest pain.  Gastrointestinal: Positive for diarrhea.  Neurological: Positive for dizziness and weakness.  All other systems reviewed and are negative.    Allergies  Amlodipine besy-benazepril hcl; Amlodipine besylate; Clonidine hydrochloride; Valsartan; and Verapamil  Home Medications   Current Outpatient Rx  Name  Route  Sig  Dispense  Refill  . ALLOPURINOL 300 MG PO TABS   Oral   Take 300 mg by mouth daily.          [name] BRIMONIDINE TARTRATE 0.1 % OP SOLN      2 (two) times daily.          [name] CETIRIZINE HCL 10 MG PO CAPS   Oral   Take by mouth.           . EPIPEN 2-PAK 0.3 MG/0.3ML IJ DEVI  Intramuscular   Inject 1 Device into the muscle daily as needed. For allergic reaction         . ESCITALOPRAM OXALATE 10 MG PO TABS   Oral   Take 1 tablet (10 mg total) by mouth daily.   90 tablet   0   . LEVOTHYROXINE SODIUM 100 MCG PO TABS      TAKE ONE TABLET BY MOUTH EVERY DAY   90 tablet   1   . LORAZEPAM 0.5 MG PO TABS   Oral   Take 1 tablet (0.5 mg total) by mouth daily as needed for anxiety.   30 tablet   0   . MAGNESIUM 200 MG PO TABS   Oral   Take 400 mg by mouth daily.         . OXYBUTYNIN CHLORIDE 5 MG PO TABS   Oral   Take 1 tablet (5 mg total) by mouth 3 (three) times daily.   90 tablet   3   . OXYCODONE-ACETAMINOPHEN 5-325 MG PO TABS   Oral   Take 2 tablets by mouth every 4 (four) hours as needed for pain.   20 tablet   0   . PINDOLOL 5 MG PO TABS      TAKE ONE TABLET BY MOUTH TWICE DAILY   180 tablet   0   . POTASSIUM GLUCONATE 550 MG PO TABS   Oral   Take 1 tablet (550 mg total) by mouth daily.    30 each   6   . PSYLLIUM 30.9 % PO POWD   Oral   Take by mouth.           . RABEPRAZOLE SODIUM 20 MG PO TBEC   Oral   Take 20 mg by mouth 2 (two) times daily.          [name] SPIRONOLACTONE 25 MG PO TABS   Oral   Take 25 mg by mouth daily.            BP 153/84  Pulse 65  Temp 98.6 F (37 C) (Oral)  Resp 18  SpO2 100%  Physical Exam  Constitutional: She is oriented to person, place, and time. She appears well-developed and well-nourished. No distress.  HENT:  Head: Normocephalic and atraumatic.  Right Ear: Hearing normal.  Nose: Nose normal.  Mouth/Throat: Oropharynx is clear and moist and mucous membranes are normal.  Eyes: Conjunctivae normal and EOM are normal. Pupils are equal, round, and reactive to light.  Neck: Normal range of motion. Neck supple.  Cardiovascular: Normal rate, regular rhythm, S1 normal and S2 normal.  Exam reveals no gallop and no friction rub.   No murmur heard. Pulmonary/Chest: Effort normal and breath sounds normal. No respiratory distress. She exhibits no tenderness.  Abdominal: Soft. Normal appearance and bowel sounds are normal. There is no hepatosplenomegaly. There is no tenderness. There is no rebound, no guarding, no tenderness at McBurney's point and negative Murphy's sign. No hernia.  Musculoskeletal: Normal range of motion.  Neurological: She is alert and oriented to person, place, and time. She has normal strength. No cranial nerve deficit or sensory deficit. Coordination normal. GCS eye subscore is 4. GCS verbal subscore is 5. GCS motor subscore is 6.  Skin: Skin is warm, dry and intact. No rash noted. No cyanosis.  Psychiatric: She has a normal mood and affect. Her speech is normal and behavior is normal. Thought content normal.    ED Course  Procedures (including critical care time)  Date: 05/28/2012  Rate: 64  Rhythm: normal sinus rhythm and premature ventricular contractions (PVC)  QRS Axis: left  Intervals: normal  ST/T  Wave abnormalities: normal  Conduction Disutrbances:left anterior fascicular block and LVH  Narrative Interpretation:   Old EKG Reviewed: unchanged    Labs Reviewed  COMPREHENSIVE METABOLIC PANEL - Abnormal; Notable for the following:    GFR calc non Af Amer 56 (*)     GFR calc Af Amer 65 (*)     All other components within normal limits  URINALYSIS, ROUTINE W REFLEX MICROSCOPIC - Abnormal; Notable for the following:    Leukocytes, UA SMALL (*)     All other components within normal limits  CBC WITH DIFFERENTIAL  PRO B NATRIURETIC PEPTIDE  POCT I-STAT TROPONIN I  URINE MICROSCOPIC-ADD ON   Ct Head Wo Contrast  05/28/2012  *RADIOLOGY REPORT*  Clinical Data: 79 year old female with headache, dizziness, confusion, hypertension.  CT HEAD WITHOUT CONTRAST  Technique:  Contiguous axial images were obtained from the base of the skull through the vertex without contrast.  Comparison: 09/18/2007.  Findings: Visualized paranasal sinuses and mastoids are clear.  No acute osseous abnormality identified.  Visualized orbits and scalp soft tissues are within normal limits.  Cerebral volume is stable and within normal limits for age.  No ventriculomegaly. No midline shift, mass effect, or evidence of mass lesion.  Gray-white matter differentiation is within normal limits throughout the brain.  No evidence of cortically based acute infarction identified.  No acute intracranial hemorrhage identified.  No suspicious intracranial vascular hyperdensity.  IMPRESSION: Stable and normal for age noncontrast CT appearance the brain.   Original Report Authenticated By: Erskine Speed, M.D.      Diagnosis: Dizziness, weakness    MDM  Patient came to the ER for evaluation of weakness and dizziness. Apparently these are not new symptoms for her. She had an episode of increased symptoms earlier, however, which prompted her to call 911. She says she does not remember calling 911. It is not clear if she has some element of  dementia, but at this time she is awake alert and oriented. Her neurologic examination is normal. She does report some element of shortness of breath, but her lungs are clear to auscultation, oxygenation is normal. She does not have any element of congestive heart failure on workup. There is no chest pain. She saw her cardiologist for these symptoms earlier this week and had her pacemaker adjusted.  At this point I do not see anything abnormal on her workup. She can be discharged home, followup with Dr. Graciela Husbands.        Gilda Crease, MD 05/28/12 2119

## 2012-05-28 NOTE — ED Notes (Signed)
ZOX:[id number]<AV> Expected date:05/28/12<BR> Expected time: 6:20 PM<BR> Means of arrival:Ambulance<BR> Comments:<BR> Dizziness

## 2012-05-28 NOTE — ED Notes (Addendum)
Per EMS - Pt called 911 d/t weakness and dizziness. Pt has no recall of calling 911. Pt alert, oriented, stroke screen negative per EMS. BP - 160/100, HR - 78, Resp - 20, O2 SAT 98% RA. CBG - 85. Pt has pacemaker which was evaluated last week.  Pt having persistent diarrhea since Dec, GI is doing w/u. #20 inserted right forearm per EMS . Pt refused flu and pneumonia vaccine this year.

## 2012-05-29 DIAGNOSIS — H4011X0 Primary open-angle glaucoma, stage unspecified: Secondary | ICD-10-CM | POA: Diagnosis not present

## 2012-05-29 DIAGNOSIS — J309 Allergic rhinitis, unspecified: Secondary | ICD-10-CM | POA: Diagnosis not present

## 2012-05-29 DIAGNOSIS — Z961 Presence of intraocular lens: Secondary | ICD-10-CM | POA: Diagnosis not present

## 2012-05-29 DIAGNOSIS — H43819 Vitreous degeneration, unspecified eye: Secondary | ICD-10-CM | POA: Diagnosis not present

## 2012-05-30 ENCOUNTER — Ambulatory Visit: Payer: Medicare Other | Admitting: Psychology

## 2012-06-04 ENCOUNTER — Other Ambulatory Visit: Payer: Self-pay | Admitting: Internal Medicine

## 2012-06-06 ENCOUNTER — Telehealth: Payer: Self-pay | Admitting: Internal Medicine

## 2012-06-06 DIAGNOSIS — J309 Allergic rhinitis, unspecified: Secondary | ICD-10-CM | POA: Diagnosis not present

## 2012-06-06 DIAGNOSIS — R0602 Shortness of breath: Secondary | ICD-10-CM

## 2012-06-06 DIAGNOSIS — R55 Syncope and collapse: Secondary | ICD-10-CM

## 2012-06-06 NOTE — Telephone Encounter (Signed)
I spoke with the patient. She states that she was taken to the ER recently. She lives on the 2 floor of a condo. She had been doing housework and went downstairs. She had to stop to rest and a neighbor called EMS to evaluate the patient because they thought she was going to pass out. She explained she told EMS she was fine and that she had a PPM. They insisted on taking her. Per the patient, her work up was uneventful. She does complain of feeling sweaty and like she will pass out with any exertion. She feels ok at rest.  She states that she has taken her BP a few times and it is good, but she does not know the readings because she hasn't recorded these. I explained I will forward to Dr. Graciela Husbands for review. I will call her back with recommendations.

## 2012-06-06 NOTE — Telephone Encounter (Signed)
New problem    Was told to call back  Speak with the nurse.

## 2012-06-07 ENCOUNTER — Encounter: Payer: Self-pay | Admitting: Internal Medicine

## 2012-06-12 ENCOUNTER — Encounter: Payer: Self-pay | Admitting: *Deleted

## 2012-06-12 NOTE — Telephone Encounter (Signed)
[name], Thank you for this information, we have entered it in your chart.

## 2012-06-12 NOTE — Telephone Encounter (Signed)
I spoke with the patient. She is aware of Dr. Odessa Fleming recommendations for GXT with him. She is agreeable. I explained I will order this and have one of the schedulers call to set this up.

## 2012-06-12 NOTE — Telephone Encounter (Signed)
plz set up appt with [facility] also Thanks steve

## 2012-06-13 ENCOUNTER — Ambulatory Visit (INDEPENDENT_AMBULATORY_CARE_PROVIDER_SITE_OTHER): Payer: BLUE CROSS/BLUE SHIELD | Admitting: Psychology

## 2012-06-13 DIAGNOSIS — F331 Major depressive disorder, recurrent, moderate: Secondary | ICD-10-CM | POA: Diagnosis not present

## 2012-06-20 DIAGNOSIS — J309 Allergic rhinitis, unspecified: Secondary | ICD-10-CM | POA: Diagnosis not present

## 2012-06-20 DIAGNOSIS — L02239 Carbuncle of trunk, unspecified: Secondary | ICD-10-CM | POA: Diagnosis not present

## 2012-06-20 DIAGNOSIS — Z124 Encounter for screening for malignant neoplasm of cervix: Secondary | ICD-10-CM | POA: Diagnosis not present

## 2012-06-27 ENCOUNTER — Ambulatory Visit: Payer: Medicare Other | Admitting: Internal Medicine

## 2012-06-27 ENCOUNTER — Ambulatory Visit: Payer: Medicare Other | Admitting: Psychology

## 2012-06-28 ENCOUNTER — Ambulatory Visit (INDEPENDENT_AMBULATORY_CARE_PROVIDER_SITE_OTHER): Payer: Medicare Other | Admitting: Internal Medicine

## 2012-06-28 DIAGNOSIS — R0602 Shortness of breath: Secondary | ICD-10-CM

## 2012-06-28 DIAGNOSIS — R55 Syncope and collapse: Secondary | ICD-10-CM

## 2012-06-28 NOTE — Progress Notes (Signed)
Exercise Treadmill Test  Pre-Exercise Testing Evaluation Rhythm::Sinus Rate: 76                 Test  Exercise Tolerance Test Ordering MD: Sherryl Manges, MD  Interpreting MD: Sherryl Manges, MD  Unique Test No: 1  Treadmill:  1  Indication for ETT: exertional dyspnea, pre-syncope  Contraindication to ETT: No   Stress Modality: exercise - treadmill  Cardiac Imaging Performed: non   Protocol: [place] - maximal  Max BP:  170/89  Max MPHR (bpm):  140 85% MPR (bpm):  119  MPHR obtained (bpm):  106 % MPHR obtained:  75%  Reached 85% MPHR (min:sec):  NA Total Exercise Time (min-sec):  3:58  Workload in METS:  2.7 Borg Scale   Reason ETT Terminated:  Adequate aHR response    ST Segment Analysis At Rest:  Other Information Arrhythmia  Comments:reasonable and perhaps excessive heart rate excursion  Recommendations: Reprogramming to decrease ADL

## 2012-07-03 ENCOUNTER — Ambulatory Visit (INDEPENDENT_AMBULATORY_CARE_PROVIDER_SITE_OTHER): Payer: Medicare Other | Admitting: Internal Medicine

## 2012-07-03 ENCOUNTER — Encounter: Payer: Self-pay | Admitting: Internal Medicine

## 2012-07-03 VITALS — BP 148/98 | HR 72 | Temp 97.4°F | Resp 16 | Wt 175.0 lb

## 2012-07-03 DIAGNOSIS — I10 Essential (primary) hypertension: Secondary | ICD-10-CM

## 2012-07-03 DIAGNOSIS — R7309 Other abnormal glucose: Secondary | ICD-10-CM

## 2012-07-03 DIAGNOSIS — E785 Hyperlipidemia, unspecified: Secondary | ICD-10-CM

## 2012-07-03 DIAGNOSIS — F411 Generalized anxiety disorder: Secondary | ICD-10-CM

## 2012-07-03 DIAGNOSIS — R739 Hyperglycemia, unspecified: Secondary | ICD-10-CM

## 2012-07-03 DIAGNOSIS — E039 Hypothyroidism, unspecified: Secondary | ICD-10-CM | POA: Diagnosis not present

## 2012-07-03 DIAGNOSIS — K589 Irritable bowel syndrome without diarrhea: Secondary | ICD-10-CM | POA: Diagnosis not present

## 2012-07-03 DIAGNOSIS — R5381 Other malaise: Secondary | ICD-10-CM

## 2012-07-03 NOTE — Assessment & Plan Note (Signed)
Continue with current prescription therapy as reflected on the Med list.  

## 2012-07-03 NOTE — Assessment & Plan Note (Signed)
Continue with current prescription therapy as reflected on the Med list. BP Readings from Last 3 Encounters:  07/03/12 148/98  05/28/12 145/92  05/23/12 146/89

## 2012-07-03 NOTE — Assessment & Plan Note (Signed)
On a diet

## 2012-07-03 NOTE — Progress Notes (Signed)
Subjective:     HPI      The patient is here to follow up on chronic IBS, GERD,  depression, anxiety, SOB symptoms controlled with medicines.  C/o legs are fatigued. No LBP... C/o ongoing IBS sx's, stress. She saw Dr Kinnie Scales - endoscopy is planned.    Wt Readings from Last 3 Encounters:  07/03/12 175 lb (79.379 kg)  05/23/12 171 lb (77.565 kg)  05/18/12 176 lb 12.8 oz (80.196 kg)   BP Readings from Last 3 Encounters:  07/03/12 148/98  05/28/12 145/92  05/23/12 146/89      Past Medical History  Diagnosis Date  . Thyroid disease   . Glaucoma   . IBS (irritable bowel syndrome)   . Incontinence   . GERD (gastroesophageal reflux disease)   . Hypertension   . Anxiety   . Depression     Dr Dellia Cloud  . HX: breast cancer   . Osteoarthritis   . Osteopenia   . Bradycardia   . IBS (irritable bowel syndrome)   . Discoid lupus     skin  . Pacemaker     Brady//Chronotropic incompetence with normal pacemaker function   Past Surgical History  Procedure Laterality Date  . Mastectomy      B  . Pacemaker placement  2009  . Foot surgery      Left    reports that she has never smoked. She does not have any smokeless tobacco history on file. She reports that she does not drink alcohol or use illicit drugs. family history includes Cancer in her brother and mother; Heart disease in her brother and father; and Stroke in her brother and sister. Allergies  Allergen Reactions  . Amlodipine Besy-Benazepril Hcl   . Amlodipine Besylate   . Clonidine Hydrochloride     REACTION: tired  . Valsartan   . Verapamil    Current Outpatient Prescriptions on File Prior to Visit  Medication Sig Dispense Refill  . allopurinol (ZYLOPRIM) 300 MG tablet Take 300 mg by mouth daily.       . Cetirizine HCl (ZYRTEC ALLERGY) 10 MG CAPS Take by mouth.        . EPIPEN 2-PAK 0.3 MG/0.3ML DEVI Inject 1 Device into the muscle daily as needed. For allergic reaction      . escitalopram (LEXAPRO) 10 MG  tablet Take 10 mg by mouth daily.      [name] levothyroxine (SYNTHROID, LEVOTHROID) 100 MCG tablet TAKE ONE TABLET BY MOUTH EVERY DAY  90 tablet  2  . LORazepam (ATIVAN) 0.5 MG tablet Take 0.5 mg by mouth daily as needed. Anxiety      . Magnesium 200 MG TABS Take 400 mg by mouth daily.      [name] oxybutynin (DITROPAN) 5 MG tablet Take 5 mg by mouth 2 (two) times daily.      . pindolol (VISKEN) 5 MG tablet TAKE ONE TABLET BY MOUTH TWICE DAILY  180 tablet  2  . Potassium Gluconate 550 MG TABS Take 1 tablet by mouth daily.      . RABEprazole (ACIPHEX) 20 MG tablet Take 20 mg by mouth 2 (two) times daily.       [name] spironolactone (ALDACTONE) 25 MG tablet Take 25 mg by mouth daily.        No current facility-administered medications on file prior to visit.   Review of Systems Review of Systems  Constitutional: Negative for diaphoresis and unexpected weight change.  HENT: Negative for drooling and tinnitus.  Eyes: Negative for photophobia and visual disturbance.  Respiratory: Negative for choking and stridor.   Gastrointestinal: Negative for vomiting and blood in stool.  Genitourinary: Negative for hematuria and decreased urine volume.  Musculoskeletal: Negative for gait problem.  Skin: Negative for color change and wound.  Neurological: Negative for tremors and numbness.  Psychiatric/Behavioral: Negative for decreased concentration. The patient is not hyperactive.   Knee pain    Objective:   Physical Exam BP 148/98  Pulse 72  Temp(Src) 97.4 F (36.3 C) (Oral)  Resp 16  Wt 175 lb (79.379 kg)  BMI 30.02 kg/m2 Physical Exam  VS noted Constitutional: Pt appears well-developed and well-nourished.  HENT: Head: Normocephalic.  Right Ear: External ear normal.  Left Ear: External ear normal.  Eyes: Conjunctivae and EOM are normal. Pupils are equal, round, and reactive to light.  Neck: Normal range of motion. Neck supple.  Cardiovascular: Normal rate and regular rhythm.   Pulmonary/Chest: Effort  normal and breath sounds normal. no rales or wheezing Abd:  Soft, NT, non-distended, + BS Neurological: Pt is alert. No cranial nerve deficit.  Skin: Skin is warm. No erythema.  Psychiatric: Pt behavior is normal. Thought content normal.  L b anserina is tender to palp CT angio - ok ECHO - OK for age  Lab Results  Component Value Date   WBC 4.3 05/28/2012   HGB 14.4 05/28/2012   HCT 43.1 05/28/2012   PLT 217 05/28/2012   GLUCOSE 94 05/28/2012   CHOL 174 05/11/2010   TRIG 78.0 05/11/2010   HDL 52.30 05/11/2010   LDLDIRECT 130.8 09/30/2006   LDLCALC 106* 05/11/2010   ALT 13 05/28/2012   AST 18 05/28/2012   NA 136 05/28/2012   K 3.8 05/28/2012   CL 100 05/28/2012   CREATININE 0.94 05/28/2012   BUN 16 05/28/2012   CO2 26 05/28/2012   TSH 2.34 04/10/2012   INR 1.00 12/13/2009   HGBA1C 6.0 04/10/2012       Assessment & Plan:

## 2012-07-06 ENCOUNTER — Telehealth: Payer: Self-pay | Admitting: Internal Medicine

## 2012-07-06 NOTE — Telephone Encounter (Signed)
New problem   Treadmill on  3/5 .    C/O rash from strips . Itching.

## 2012-07-06 NOTE — Telephone Encounter (Signed)
Pt is advised to take OTC benadryl and to apply hydrocortisone cream to area to help with the itching, she verbalized understanding.

## 2012-07-11 ENCOUNTER — Encounter: Payer: Self-pay | Admitting: Internal Medicine

## 2012-07-11 ENCOUNTER — Ambulatory Visit: Payer: Medicare Other | Admitting: Psychology

## 2012-07-17 DIAGNOSIS — K219 Gastro-esophageal reflux disease without esophagitis: Secondary | ICD-10-CM | POA: Diagnosis not present

## 2012-07-19 ENCOUNTER — Other Ambulatory Visit (HOSPITAL_COMMUNITY): Payer: Self-pay | Admitting: Psychiatry

## 2012-07-19 ENCOUNTER — Ambulatory Visit (INDEPENDENT_AMBULATORY_CARE_PROVIDER_SITE_OTHER): Payer: Medicare Other | Admitting: Psychology

## 2012-07-19 DIAGNOSIS — F331 Major depressive disorder, recurrent, moderate: Secondary | ICD-10-CM

## 2012-07-19 DIAGNOSIS — F329 Major depressive disorder, single episode, unspecified: Secondary | ICD-10-CM

## 2012-07-19 DIAGNOSIS — J309 Allergic rhinitis, unspecified: Secondary | ICD-10-CM | POA: Diagnosis not present

## 2012-07-25 ENCOUNTER — Ambulatory Visit (INDEPENDENT_AMBULATORY_CARE_PROVIDER_SITE_OTHER): Payer: Medicare Other | Admitting: Psychology

## 2012-07-25 DIAGNOSIS — F331 Major depressive disorder, recurrent, moderate: Secondary | ICD-10-CM | POA: Diagnosis not present

## 2012-07-28 DIAGNOSIS — IMO0002 Reserved for concepts with insufficient information to code with codable children: Secondary | ICD-10-CM | POA: Diagnosis not present

## 2012-07-29 ENCOUNTER — Encounter: Payer: Self-pay | Admitting: Internal Medicine

## 2012-08-02 DIAGNOSIS — M25569 Pain in unspecified knee: Secondary | ICD-10-CM | POA: Diagnosis not present

## 2012-08-02 DIAGNOSIS — IMO0002 Reserved for concepts with insufficient information to code with codable children: Secondary | ICD-10-CM | POA: Diagnosis not present

## 2012-08-08 ENCOUNTER — Ambulatory Visit (INDEPENDENT_AMBULATORY_CARE_PROVIDER_SITE_OTHER): Payer: Medicare Other | Admitting: Psychology

## 2012-08-08 DIAGNOSIS — F331 Major depressive disorder, recurrent, moderate: Secondary | ICD-10-CM

## 2012-08-09 DIAGNOSIS — M25569 Pain in unspecified knee: Secondary | ICD-10-CM | POA: Diagnosis not present

## 2012-08-14 ENCOUNTER — Other Ambulatory Visit: Payer: Self-pay | Admitting: Orthopaedic Surgery

## 2012-08-14 ENCOUNTER — Ambulatory Visit (INDEPENDENT_AMBULATORY_CARE_PROVIDER_SITE_OTHER): Payer: Medicare Other | Admitting: *Deleted

## 2012-08-14 ENCOUNTER — Other Ambulatory Visit: Payer: Self-pay

## 2012-08-14 DIAGNOSIS — I495 Sick sinus syndrome: Secondary | ICD-10-CM | POA: Diagnosis not present

## 2012-08-14 DIAGNOSIS — Z950 Presence of cardiac pacemaker: Secondary | ICD-10-CM

## 2012-08-18 DIAGNOSIS — K594 Anal spasm: Secondary | ICD-10-CM | POA: Diagnosis not present

## 2012-08-18 DIAGNOSIS — K602 Anal fissure, unspecified: Secondary | ICD-10-CM | POA: Diagnosis not present

## 2012-08-18 DIAGNOSIS — K6289 Other specified diseases of anus and rectum: Secondary | ICD-10-CM | POA: Diagnosis not present

## 2012-08-21 ENCOUNTER — Ambulatory Visit (HOSPITAL_COMMUNITY): Payer: Self-pay | Admitting: Psychiatry

## 2012-08-22 ENCOUNTER — Encounter (HOSPITAL_COMMUNITY): Payer: Self-pay | Admitting: Psychiatry

## 2012-08-22 ENCOUNTER — Ambulatory Visit (INDEPENDENT_AMBULATORY_CARE_PROVIDER_SITE_OTHER): Payer: Medicare Other | Admitting: Psychiatry

## 2012-08-22 ENCOUNTER — Encounter: Payer: Self-pay | Admitting: Internal Medicine

## 2012-08-22 VITALS — BP 140/90 | HR 76 | Wt 178.0 lb

## 2012-08-22 DIAGNOSIS — J309 Allergic rhinitis, unspecified: Secondary | ICD-10-CM | POA: Diagnosis not present

## 2012-08-22 DIAGNOSIS — F329 Major depressive disorder, single episode, unspecified: Secondary | ICD-10-CM | POA: Diagnosis not present

## 2012-08-22 MED ORDER — ESCITALOPRAM OXALATE 10 MG PO TABS: 10.0000 mg | ORAL_TABLET | Freq: Every day | ORAL | Status: DC

## 2012-08-22 MED ORDER — LORAZEPAM 0.5 MG PO TABS: ORAL_TABLET | ORAL | Status: DC

## 2012-08-22 NOTE — Progress Notes (Signed)
[facility] [id number] Progress Note  [name] [id number] 79 y.o.  08/22/2012 2:20 PM  Chief Complaint:  I'm going to have knee surgery next week.  History presenting illness Patient is 79 year old [place] American female who came for her appointment.  She is complaining of neck pain.  She is going to have knee surgery next week.  She is anxious but also feels ready because she cannot handle the pain anymore .  She is frustrated with the doctors .  She felt she should have knee surgery long time ago .  She is also trying to get a second opinion at [facility] for her IBS.  She continues to have nausea abdominal pain .  She is taking Lexapro but she still feels anxiety and depressed.  She sleeps good .  She is reluctant to increase her Lexapro .  She endorsed increased GI symptoms with increase Lexapro.  She is also taking lorazepam as needed and in the last month she has taken more than usual however still she is not take an extra.  She endorse any issues but did not talk more in detail.  She's been seeing Dr. Orland Mustard for counseling.  In February she was seen in the emergency room when she felt very dizzy and someone called 911.  Patient will not recall the details .  Her labs were normal.  She's not drinking and using any illegal substance.  Suicidal Ideation: No Plan Formed: No Patient has means to carry out plan: No  Homicidal Ideation: No Plan Formed: No Patient has means to carry out plan: No  Review of Systems: Psychiatric: Agitation: No Hallucination: No Depressed Mood: Yes Insomnia: Yes Hypersomnia: No Altered Concentration: No Feels Worthless: No Grandiose Ideas: No Belief In Special Powers: No New/Increased Substance Abuse: No Compulsions: No  Neurologic: Headache: Yes Seizure: No Paresthesias: No Review of Systems  Constitutional: Positive for malaise/fatigue.  Gastrointestinal: Positive for nausea and abdominal pain.  Musculoskeletal: Positive for back  pain and joint pain.  Neurological: Positive for dizziness.  Psychiatric/Behavioral: Positive for depression. Negative for suicidal ideas, hallucinations and substance abuse. The patient is nervous/anxious and has insomnia.    Past psychiatric history Patient has one history of psychiatric inpatient treatment many years ago when she was admitted due to suicidal thinking.  Patient denies any history of suicidal attempt or any psychosis.    Medical history Patient has history of hypothyroidism, vitamin D deficiency, hyperlipidemia, hypertension, allergic rhinitis, GERD, and a fissure, cystitis, chronic diarrhea, afibrillation, osteoporosis, chronic fatigue, history of breast cancer, hearing loss, irritable bladder, chronic knee pain and dysphagia.  Her primary care physician is Dr. Sheran Luz.  Social History: Patient lives alone however she has a good support system.  She has 2 son and one daughter.  Patient has several grandkids.  Outpatient Encounter Prescriptions as of 08/22/2012  Medication Sig Dispense Refill  . allopurinol (ZYLOPRIM) 300 MG tablet Take 300 mg by mouth daily.       . Cetirizine HCl (ZYRTEC ALLERGY) 10 MG CAPS Take by mouth.        . EPIPEN 2-PAK 0.3 MG/0.3ML DEVI Inject 1 Device into the muscle daily as needed. For allergic reaction      . escitalopram (LEXAPRO) 10 MG tablet Take 1 tablet (10 mg total) by mouth daily.  90 tablet  0  . Garlic TABS Take by mouth daily.      [name] levothyroxine (SYNTHROID, LEVOTHROID) 100 MCG tablet TAKE ONE TABLET BY MOUTH EVERY DAY  90 tablet  2  . LORazepam (ATIVAN) 0.5 MG tablet TAKE ONE TABLET BY MOUTH EVERY DAY AS NEEDED FOR ANXIETY  30 tablet  0  . Magnesium 200 MG TABS Take 400 mg by mouth daily.      [name] oxybutynin (DITROPAN) 5 MG tablet Take 5 mg by mouth 2 (two) times daily.      . pindolol (VISKEN) 5 MG tablet TAKE ONE TABLET BY MOUTH TWICE DAILY  180 tablet  2  . Potassium Gluconate 550 MG TABS Take 1 tablet by mouth daily.      .  RABEprazole (ACIPHEX) 20 MG tablet Take 20 mg by mouth 2 (two) times daily.       [name] spironolactone (ALDACTONE) 25 MG tablet Take 25 mg by mouth daily.       . [DISCONTINUED] escitalopram (LEXAPRO) 10 MG tablet Take 10 mg by mouth daily.      . [DISCONTINUED] LORazepam (ATIVAN) 0.5 MG tablet TAKE ONE TABLET BY MOUTH EVERY DAY AS NEEDED FOR ANXIETY  30 tablet  0   No facility-administered encounter medications on file as of 08/22/2012.    Past Psychiatric History/Hospitalization(s): Anxiety: Yes Bipolar Disorder: No Depression: Yes Mania: No Psychosis: No Schizophrenia: No Personality Disorder: No Hospitalization for psychiatric illness: Yes History of Electroconvulsive Shock Therapy: No Prior Suicide Attempts: No  Physical Exam: Constitutional:  BP 140/90  Pulse 76  Wt 178 lb (80.74 kg)  BMI 30.54 kg/m2  Mental status examination Patient is casually dressed and fairly groomed.  She appears anxious and her affect is mood appropriate.  Her speech is soft but clear and coherent with decreased volume and tone.  Her thought process is logical linear and goal-directed.  She denies any active or passive suicidal thoughts or homicidal thoughts but endorse chronic feeling of hopelessness.  There were no flight of ideas or any loose association.  Her attention and concentration is fair.  She denies any auditory or visual hallucination.  There were no paranoia or delusion present at this time.  Her psychomotor activity slightly decreased.  There were no tremors or shakes.  She's alert and oriented x3.  Her insight judgment and impulse control is okay.  Medical Decision Making (Choose Three): Established Problem, Stable/Improving (1), Review of Psycho-Social Stressors (1), Review or order clinical lab tests (1), Review and summation of old records (2), Review of Last Therapy Session (1) and Review of Medication Regimen & Side Effects (2)  Assessment: Axis I: Maj. depressive disorder  Axis II:  Deferred  Axis III: See medical history  Axis IV: Mild to moderate  Axis V: 60-65   Plan:  Reassurance given.  One more time recommend to either increase Lexapro or try a different antidepressant but patient refused.  At this time patient is not having side effects of Lexapro.  Talked about upcoming knee surgery next week .  She also scheduled to see a specialist for her IBS in July.  Recommend continue counseling with therapist.  I reviewed her blood work which was done on February 14 .  A CBC and chemistries were normal other than mild GFR decreased .  Recommend because package is a question of construction for most of the symptom.  Time spent 30 minutes.  More than 50% of the time spent and psychoeducation constant incorporation of care.  I will see her again in 3 months.  New prescription of Lexapro 10 mg 1 daily and Ativan 0.5 mg as needed given .   Dymond Gutt T.,  MD 08/22/2012

## 2012-08-24 ENCOUNTER — Ambulatory Visit (INDEPENDENT_AMBULATORY_CARE_PROVIDER_SITE_OTHER): Payer: Medicare Other | Admitting: Psychology

## 2012-08-24 ENCOUNTER — Encounter (HOSPITAL_BASED_OUTPATIENT_CLINIC_OR_DEPARTMENT_OTHER): Payer: Self-pay | Admitting: *Deleted

## 2012-08-24 DIAGNOSIS — F331 Major depressive disorder, recurrent, moderate: Secondary | ICD-10-CM | POA: Diagnosis not present

## 2012-08-24 NOTE — Progress Notes (Signed)
Pt lives alone but drives and cares for herself-very sharp-has a pacemaker-form sent to dr Wynetta Emery come in for bmet-had ekg 3/14-echo 2/13-ef 60%

## 2012-08-25 NOTE — H&P (Signed)
[name] is an 79 y.o. female.   Chief Complaint: Left knee pain HPI: Her knee has been bothering her for a number of months.  She is now having trouble remaining activity and trouble resting at nighttime.  She has been treated with physical therapy and an injection with minimal benefit.  A recent ultrasound done on 08/02/12 shows both medial and lateral meniscus tears.  We have discussed proceeding with an arthroscopy to take care of the meniscus tears and improve her function and decrease her pain.Plain x-rays are positive for patellofemoral DJD.  Past Medical History  Diagnosis Date  . Thyroid disease   . Glaucoma   . IBS (irritable bowel syndrome)   . Incontinence   . GERD (gastroesophageal reflux disease)   . Hypertension   . Anxiety   . Depression     Dr Dellia Cloud  . HX: breast cancer   . Osteoarthritis   . Osteopenia   . Bradycardia   . IBS (irritable bowel syndrome)   . Discoid lupus     skin  . Pacemaker     Brady//Chronotropic incompetence with normal pacemaker function  . Full dentures   . Wears hearing aid     both ears    Past Surgical History  Procedure Laterality Date  . Pacemaker placement  2009  . Foot surgery  2004    Left-hammer toes  . Mastectomy  2002    bilat mastectomy-snbx  . Breast lumpectomy  1995    lt -axillary node dissectoin  . Dorsal compartment release  2000    left  . Abdominal hysterectomy    . Eye surgery  2011    cataract-lt    Family History  Problem Relation Age of Onset  . Cancer Mother   . Heart disease Father   . Stroke Sister   . Stroke Brother   . Cancer Brother   . Heart disease Brother    Social History:  reports that she has never smoked. She does not have any smokeless tobacco history on file. She reports that she does not drink alcohol or use illicit drugs.  Allergies:  Allergies  Allergen Reactions  . Amlodipine Besy-Benazepril Hcl   . Amlodipine Besylate   . Clonidine Hydrochloride     REACTION: tired  .  Lactose Intolerance (Gi)   . Valsartan   . Verapamil     No prescriptions prior to admission    No results found for this or any previous visit (from the past 48 hour(s)). No results found.  Review of Systems  Constitutional: Negative.   HENT: Negative.   Eyes: Negative.   Respiratory: Negative.   Cardiovascular: Negative.   Gastrointestinal: Negative.   Genitourinary: Negative.   Musculoskeletal: Positive for joint pain.  Skin: Negative.   Neurological: Negative.   Endo/Heme/Allergies: Negative.   Psychiatric/Behavioral: Negative.     There were no vitals taken for this visit. Physical Exam  Constitutional: She appears well-nourished.  HENT:  Head: Atraumatic.  Eyes: Conjunctivae are normal.  Neck: Neck supple.  Cardiovascular: Regular rhythm.   Respiratory: Breath sounds normal.  GI: Bowel sounds are normal.  Musculoskeletal:  Left knee with trace effusion.  Crepitation 1+.  Range of motion 0-1 20.  Pain along the medial and lateral joint line to palpation.  Good sensory and motor function distally.  Hip normal  Neurological: She is alert.  Skin: Skin is warm.  Psychiatric: She has a normal mood and affect.     Assessment/Plan Assessment: Left  knee patellofemoral DJD with torn meniscus by ultrasound. Plan: Because of her continued left knee pain we have discussed proceeding with an arthroscopy.  The risk of infection, anesthesia and potential DVT associated with knee arthroscopy discussed with her today.  We have also discussed the need for postoperative physical therapy to maximize her results.  Christia Coaxum R 08/25/2012, 12:44 PM

## 2012-08-27 LAB — REMOTE PACEMAKER DEVICE
AL AMPLITUDE: 2.8 mv
AL IMPEDENCE PM: 509 Ohm
BAMS-0001: 175 {beats}/min
BATTERY VOLTAGE: 2.77 V
RV LEAD IMPEDENCE PM: 510 Ohm
VENTRICULAR PACING PM: 0

## 2012-08-28 ENCOUNTER — Encounter (HOSPITAL_BASED_OUTPATIENT_CLINIC_OR_DEPARTMENT_OTHER)
Admission: RE | Admit: 2012-08-28 | Discharge: 2012-08-28 | Disposition: A | Discharge status: Home / Self Care | Payer: Medicare Other | Source: Ambulatory Visit | Attending: Orthopaedic Surgery | Admitting: Orthopaedic Surgery

## 2012-08-28 DIAGNOSIS — H43819 Vitreous degeneration, unspecified eye: Secondary | ICD-10-CM | POA: Diagnosis not present

## 2012-08-28 DIAGNOSIS — M259 Joint disorder, unspecified: Secondary | ICD-10-CM | POA: Diagnosis not present

## 2012-08-28 DIAGNOSIS — F411 Generalized anxiety disorder: Secondary | ICD-10-CM | POA: Diagnosis not present

## 2012-08-28 DIAGNOSIS — Z888 Allergy status to other drugs, medicaments and biological substances status: Secondary | ICD-10-CM | POA: Diagnosis not present

## 2012-08-28 DIAGNOSIS — M23329 Other meniscus derangements, posterior horn of medial meniscus, unspecified knee: Secondary | ICD-10-CM | POA: Diagnosis not present

## 2012-08-28 DIAGNOSIS — E079 Disorder of thyroid, unspecified: Secondary | ICD-10-CM | POA: Diagnosis not present

## 2012-08-28 DIAGNOSIS — M1710 Unilateral primary osteoarthritis, unspecified knee: Secondary | ICD-10-CM | POA: Diagnosis not present

## 2012-08-28 DIAGNOSIS — I10 Essential (primary) hypertension: Secondary | ICD-10-CM | POA: Diagnosis not present

## 2012-08-28 DIAGNOSIS — I498 Other specified cardiac arrhythmias: Secondary | ICD-10-CM | POA: Diagnosis not present

## 2012-08-28 DIAGNOSIS — H47239 Glaucomatous optic atrophy, unspecified eye: Secondary | ICD-10-CM | POA: Diagnosis not present

## 2012-08-28 DIAGNOSIS — H2510 Age-related nuclear cataract, unspecified eye: Secondary | ICD-10-CM | POA: Diagnosis not present

## 2012-08-28 DIAGNOSIS — Z950 Presence of cardiac pacemaker: Secondary | ICD-10-CM | POA: Diagnosis not present

## 2012-08-28 DIAGNOSIS — F329 Major depressive disorder, single episode, unspecified: Secondary | ICD-10-CM | POA: Diagnosis not present

## 2012-08-28 DIAGNOSIS — K589 Irritable bowel syndrome without diarrhea: Secondary | ICD-10-CM | POA: Diagnosis not present

## 2012-08-28 DIAGNOSIS — L930 Discoid lupus erythematosus: Secondary | ICD-10-CM | POA: Diagnosis not present

## 2012-08-28 DIAGNOSIS — K219 Gastro-esophageal reflux disease without esophagitis: Secondary | ICD-10-CM | POA: Diagnosis not present

## 2012-08-28 DIAGNOSIS — M899 Disorder of bone, unspecified: Secondary | ICD-10-CM | POA: Diagnosis not present

## 2012-08-28 LAB — BASIC METABOLIC PANEL
BUN: 14 mg/dL (ref 6–23)
Chloride: 103 mEq/L (ref 96–112)
GFR calc Af Amer: 56 mL/min — ABNORMAL LOW (ref >90)
Glucose, Bld: 83 mg/dL (ref 70–99)
Potassium: 4.9 mEq/L (ref 3.5–5.1)

## 2012-08-28 NOTE — Progress Notes (Signed)
Reviewed with dr Gelene Mink ok for surg

## 2012-08-29 ENCOUNTER — Encounter (HOSPITAL_BASED_OUTPATIENT_CLINIC_OR_DEPARTMENT_OTHER): Payer: Self-pay | Admitting: Anesthesiology

## 2012-08-29 ENCOUNTER — Encounter (HOSPITAL_BASED_OUTPATIENT_CLINIC_OR_DEPARTMENT_OTHER)
Admission: RE | Disposition: A | Discharge status: Home / Self Care | Payer: Self-pay | Source: Ambulatory Visit | Attending: Orthopaedic Surgery

## 2012-08-29 ENCOUNTER — Ambulatory Visit (HOSPITAL_BASED_OUTPATIENT_CLINIC_OR_DEPARTMENT_OTHER)
Admission: RE | Admit: 2012-08-29 | Discharge: 2012-08-29 | Disposition: A | Discharge status: Home / Self Care | Payer: Medicare Other | Source: Ambulatory Visit | Attending: Orthopaedic Surgery | Admitting: Orthopaedic Surgery

## 2012-08-29 ENCOUNTER — Encounter (HOSPITAL_BASED_OUTPATIENT_CLINIC_OR_DEPARTMENT_OTHER): Payer: Self-pay | Admitting: *Deleted

## 2012-08-29 ENCOUNTER — Ambulatory Visit (HOSPITAL_BASED_OUTPATIENT_CLINIC_OR_DEPARTMENT_OTHER): Payer: Medicare Other | Admitting: Anesthesiology

## 2012-08-29 DIAGNOSIS — I498 Other specified cardiac arrhythmias: Secondary | ICD-10-CM | POA: Insufficient documentation

## 2012-08-29 DIAGNOSIS — Z950 Presence of cardiac pacemaker: Secondary | ICD-10-CM | POA: Insufficient documentation

## 2012-08-29 DIAGNOSIS — M7130 Other bursal cyst, unspecified site: Secondary | ICD-10-CM | POA: Diagnosis not present

## 2012-08-29 DIAGNOSIS — IMO0002 Reserved for concepts with insufficient information to code with codable children: Secondary | ICD-10-CM | POA: Diagnosis not present

## 2012-08-29 DIAGNOSIS — K589 Irritable bowel syndrome without diarrhea: Secondary | ICD-10-CM | POA: Insufficient documentation

## 2012-08-29 DIAGNOSIS — S83289A Other tear of lateral meniscus, current injury, unspecified knee, initial encounter: Secondary | ICD-10-CM | POA: Diagnosis not present

## 2012-08-29 DIAGNOSIS — M259 Joint disorder, unspecified: Secondary | ICD-10-CM | POA: Diagnosis not present

## 2012-08-29 DIAGNOSIS — M899 Disorder of bone, unspecified: Secondary | ICD-10-CM | POA: Insufficient documentation

## 2012-08-29 DIAGNOSIS — F411 Generalized anxiety disorder: Secondary | ICD-10-CM | POA: Insufficient documentation

## 2012-08-29 DIAGNOSIS — M23329 Other meniscus derangements, posterior horn of medial meniscus, unspecified knee: Secondary | ICD-10-CM | POA: Diagnosis not present

## 2012-08-29 DIAGNOSIS — L930 Discoid lupus erythematosus: Secondary | ICD-10-CM | POA: Insufficient documentation

## 2012-08-29 DIAGNOSIS — F3289 Other specified depressive episodes: Secondary | ICD-10-CM | POA: Insufficient documentation

## 2012-08-29 DIAGNOSIS — E079 Disorder of thyroid, unspecified: Secondary | ICD-10-CM | POA: Insufficient documentation

## 2012-08-29 DIAGNOSIS — M1710 Unilateral primary osteoarthritis, unspecified knee: Secondary | ICD-10-CM | POA: Insufficient documentation

## 2012-08-29 DIAGNOSIS — F329 Major depressive disorder, single episode, unspecified: Secondary | ICD-10-CM | POA: Insufficient documentation

## 2012-08-29 DIAGNOSIS — K219 Gastro-esophageal reflux disease without esophagitis: Secondary | ICD-10-CM | POA: Insufficient documentation

## 2012-08-29 DIAGNOSIS — Z888 Allergy status to other drugs, medicaments and biological substances status: Secondary | ICD-10-CM | POA: Insufficient documentation

## 2012-08-29 DIAGNOSIS — S83241A Other tear of medial meniscus, current injury, right knee, initial encounter: Secondary | ICD-10-CM

## 2012-08-29 DIAGNOSIS — I10 Essential (primary) hypertension: Secondary | ICD-10-CM | POA: Insufficient documentation

## 2012-08-29 DIAGNOSIS — M8569 Other cyst of bone, multiple sites: Secondary | ICD-10-CM | POA: Diagnosis not present

## 2012-08-29 HISTORY — DX: Complete loss of teeth, unspecified cause, unspecified class: Z97.2

## 2012-08-29 HISTORY — PX: KNEE ARTHROSCOPY: SHX127

## 2012-08-29 HISTORY — DX: Complete loss of teeth, unspecified cause, unspecified class: K08.109

## 2012-08-29 HISTORY — DX: Presence of external hearing-aid: Z97.4

## 2012-08-29 LAB — POCT HEMOGLOBIN-HEMACUE: Hemoglobin: 13.2 g/dL (ref 12.0–15.0)

## 2012-08-29 SURGERY — ARTHROSCOPY, KNEE
Anesthesia: General | Site: Knee | Laterality: Left | Wound class: Clean

## 2012-08-29 MED ORDER — MIDAZOLAM HCL 2 MG/2ML IJ SOLN: 1.0000 mg | INTRAMUSCULAR | Status: DC | PRN

## 2012-08-29 MED ORDER — FENTANYL CITRATE 0.05 MG/ML IJ SOLN
50.0000 ug | INTRAMUSCULAR | Status: DC | PRN
Start: 2012-08-29 — End: 2012-08-29

## 2012-08-29 MED ORDER — BUPIVACAINE-EPINEPHRINE 0.5% -1:200000 IJ SOLN: INTRAMUSCULAR | Status: DC | PRN

## 2012-08-29 MED ORDER — SODIUM CHLORIDE 0.9 % IR SOLN
Status: DC | PRN
  Administered 2012-08-29: 2

## 2012-08-29 MED ORDER — CEFAZOLIN SODIUM-DEXTROSE 2-3 GM-% IV SOLR
2.0000 g | INTRAVENOUS | Status: AC
  Administered 2012-08-29: 2 g via INTRAVENOUS

## 2012-08-29 MED ORDER — LIDOCAINE HCL (CARDIAC) 20 MG/ML IV SOLN
INTRAVENOUS | Status: DC | PRN
  Administered 2012-08-29: 60 mg via INTRAVENOUS

## 2012-08-29 MED ORDER — DEXAMETHASONE SODIUM PHOSPHATE 4 MG/ML IJ SOLN
INTRAMUSCULAR | Status: DC | PRN
  Administered 2012-08-29: 4 mg via INTRAVENOUS

## 2012-08-29 MED ORDER — PROPOFOL 10 MG/ML IV BOLUS
INTRAVENOUS | Status: DC | PRN
  Administered 2012-08-29: 50 mg via INTRAVENOUS
  Administered 2012-08-29: 150 mg via INTRAVENOUS

## 2012-08-29 MED ORDER — ONDANSETRON HCL 4 MG/2ML IJ SOLN
INTRAMUSCULAR | Status: DC | PRN
  Administered 2012-08-29: 4 mg via INTRAVENOUS

## 2012-08-29 MED ORDER — FENTANYL CITRATE 0.05 MG/ML IJ SOLN
25.0000 ug | INTRAMUSCULAR | Status: DC | PRN
  Administered 2012-08-29: 25 ug via INTRAVENOUS

## 2012-08-29 MED ORDER — LACTATED RINGERS IV SOLN
INTRAVENOUS | Status: DC
  Administered 2012-08-29: 07:00:00 via INTRAVENOUS

## 2012-08-29 MED ORDER — FENTANYL CITRATE 0.05 MG/ML IJ SOLN
INTRAMUSCULAR | Status: DC | PRN
  Administered 2012-08-29: 50 ug via INTRAVENOUS

## 2012-08-29 MED ORDER — MIDAZOLAM HCL 5 MG/5ML IJ SOLN
INTRAMUSCULAR | Status: DC | PRN
  Administered 2012-08-29: 2 mg via INTRAVENOUS

## 2012-08-29 MED ORDER — LACTATED RINGERS IV SOLN: INTRAVENOUS | Status: DC

## 2012-08-29 MED ORDER — CHLORHEXIDINE GLUCONATE 4 % EX LIQD: 60.0000 mL | Freq: Once | CUTANEOUS | Status: DC

## 2012-08-29 MED ORDER — ONDANSETRON HCL 4 MG/2ML IJ SOLN: 4.0000 mg | Freq: Once | INTRAMUSCULAR | Status: DC | PRN

## 2012-08-29 MED ORDER — HYDROCODONE-ACETAMINOPHEN 5-325 MG PO TABS: 1.0000 | ORAL_TABLET | Freq: Four times a day (QID) | ORAL | Status: DC | PRN

## 2012-08-29 SURGICAL SUPPLY — 41 items
BANDAGE ELASTIC 6 VELCRO ST LF (GAUZE/BANDAGES/DRESSINGS) ×2 IMPLANT
BANDAGE GAUZE ELAST BULKY 4 IN (GAUZE/BANDAGES/DRESSINGS) ×2 IMPLANT
BLADE CUDA 5.5 (BLADE) IMPLANT
BLADE GREAT WHITE 4.2 (BLADE) ×2 IMPLANT
CANISTER OMNI JUG 16 LITER (MISCELLANEOUS) ×1 IMPLANT
CANISTER SUCTION 2500CC (MISCELLANEOUS) IMPLANT
DRAPE ARTHROSCOPY W/POUCH 114 (DRAPES) ×2 IMPLANT
DRAPE U-SHAPE 47X51 STRL (DRAPES) ×2 IMPLANT
DRSG EMULSION OIL 3X3 NADH (GAUZE/BANDAGES/DRESSINGS) ×2 IMPLANT
DURAPREP 26ML APPLICATOR (WOUND CARE) ×3 IMPLANT
ELECT MENISCUS 165MM 90D (ELECTRODE) IMPLANT
ELECT REM PT RETURN 9FT ADLT (ELECTROSURGICAL)
ELECTRODE REM PT RTRN 9FT ADLT (ELECTROSURGICAL) IMPLANT
GLOVE BIO SURGEON STRL SZ 6.5 (GLOVE) ×1 IMPLANT
GLOVE BIO SURGEON STRL SZ8.5 (GLOVE) ×3 IMPLANT
GLOVE BIOGEL PI IND STRL 7.0 (GLOVE) IMPLANT
GLOVE BIOGEL PI IND STRL 8 (GLOVE) ×1 IMPLANT
GLOVE BIOGEL PI IND STRL 8.5 (GLOVE) ×1 IMPLANT
GLOVE BIOGEL PI INDICATOR 7.0 (GLOVE) ×1
GLOVE BIOGEL PI INDICATOR 8 (GLOVE) ×2
GLOVE BIOGEL PI INDICATOR 8.5 (GLOVE) ×1
GLOVE INDICATOR 7.0 STRL GRN (GLOVE) ×1 IMPLANT
GLOVE SS BIOGEL STRL SZ 8 (GLOVE) ×1 IMPLANT
GLOVE SUPERSENSE BIOGEL SZ 8 (GLOVE) ×1
GOWN PREVENTION PLUS XLARGE (GOWN DISPOSABLE) ×2 IMPLANT
GOWN PREVENTION PLUS XXLARGE (GOWN DISPOSABLE) ×3 IMPLANT
IV NS IRRIG 3000ML ARTHROMATIC (IV SOLUTION) IMPLANT
KNEE WRAP E Z 3 GEL PACK (MISCELLANEOUS) ×2 IMPLANT
PACK ARTHROSCOPY DSU (CUSTOM PROCEDURE TRAY) ×2 IMPLANT
PACK BASIN DAY SURGERY FS (CUSTOM PROCEDURE TRAY) ×2 IMPLANT
PENCIL BUTTON HOLSTER BLD 10FT (ELECTRODE) IMPLANT
SET ARTHROSCOPY TUBING (MISCELLANEOUS) ×2
SET ARTHROSCOPY TUBING LN (MISCELLANEOUS) ×1 IMPLANT
SHEET MEDIUM DRAPE 40X70 STRL (DRAPES) ×2 IMPLANT
SPONGE GAUZE 4X4 12PLY (GAUZE/BANDAGES/DRESSINGS) ×2 IMPLANT
SYR 3ML 18GX1 1/2 (SYRINGE) IMPLANT
TOWEL OR 17X24 6PK STRL BLUE (TOWEL DISPOSABLE) ×2 IMPLANT
TOWEL OR NON WOVEN STRL DISP B (DISPOSABLE) ×2 IMPLANT
WAND 30 DEG SABER W/CORD (SURGICAL WAND) IMPLANT
WAND STAR VAC 90 (SURGICAL WAND) IMPLANT
WATER STERILE IRR 1000ML POUR (IV SOLUTION) ×2 IMPLANT

## 2012-08-29 NOTE — Anesthesia Preprocedure Evaluation (Addendum)
Anesthesia Evaluation  Patient identified by MRN, date of birth, ID band Patient awake    Reviewed: Allergy & Precautions, H&P , NPO status , Patient's Chart, lab work & pertinent test results, reviewed documented beta blocker date and time   History of Anesthesia Complications Negative for: history of anesthetic complications  Airway       Dental   Pulmonary          Cardiovascular hypertension, + dysrhythmias Ventricular Tachycardia + pacemaker     Neuro/Psych Anxiety Depression    GI/Hepatic   Endo/Other    Renal/GU      Musculoskeletal   Abdominal   Peds  Hematology   Anesthesia Other Findings   Reproductive/Obstetrics                          Anesthesia Physical Anesthesia Plan Anesthesia Quick Evaluation

## 2012-08-29 NOTE — Anesthesia Procedure Notes (Signed)
Procedure Name: LMA Insertion Date/Time: 08/29/2012 7:46 AM Performed by: Burna Cash Pre-anesthesia Checklist: Patient identified, Emergency Drugs available, Suction available and Patient being monitored Patient Re-evaluated:Patient Re-evaluated prior to inductionOxygen Delivery Method: Circle System Utilized Preoxygenation: Pre-oxygenation with 100% oxygen Intubation Type: IV induction Ventilation: Mask ventilation without difficulty LMA: LMA inserted LMA Size: 4.0 Number of attempts: 1 Airway Equipment and Method: bite block Placement Confirmation: positive ETCO2 Tube secured with: Tape Dental Injury: Teeth and Oropharynx as per pre-operative assessment

## 2012-08-29 NOTE — Op Note (Signed)
#  [id number] 

## 2012-08-29 NOTE — Anesthesia Postprocedure Evaluation (Signed)
  Anesthesia Post-op Note  Patient: [name]  Procedure(s) Performed: Procedure(s): LEFT KNEE ARTHROSCOPY  (Left)  Patient Location: PACU  Anesthesia Type:General  Level of Consciousness: awake, alert  and oriented  Airway and Oxygen Therapy: Patient Spontanous Breathing  Post-op Pain: mild  Post-op Assessment: Post-op Vital signs reviewed  Post-op Vital Signs: Reviewed  Complications: No apparent anesthesia complications

## 2012-08-29 NOTE — Transfer of Care (Signed)
Immediate Anesthesia Transfer of Care Note  Patient: [name]  Procedure(s) Performed: Procedure(s): LEFT KNEE ARTHROSCOPY  (Left)  Patient Location: PACU  Anesthesia Type:General  Level of Consciousness: sedated  Airway & Oxygen Therapy: Patient Spontanous Breathing and Patient connected to face mask oxygen  Post-op Assessment: Report given to PACU RN and Post -op Vital signs reviewed and stable  Post vital signs: Reviewed and stable  Complications: No apparent anesthesia complications

## 2012-08-29 NOTE — Interval H&P Note (Signed)
History and Physical Interval Note:  08/29/2012 7:30 AM  [name]  has presented today for surgery, with the diagnosis of LEFT KNEE LATERAL MENISCIAL TEAR AND MEDIAL MENISCIAL TEAR ,DJD    The various methods of treatment have been discussed with the patient and family. After consideration of risks, benefits and other options for treatment, the patient has consented to  Procedure(s): LEFT KNEE ARTHROSCOPY  (Left) as a surgical intervention .  The patient's history has been reviewed, patient examined, no change in status, stable for surgery.  I have reviewed the patient's chart and labs.  Questions were answered to the patient's satisfaction.     [name]

## 2012-08-29 NOTE — Progress Notes (Signed)
Entered by Janith Lima @1045 

## 2012-08-30 ENCOUNTER — Encounter (HOSPITAL_BASED_OUTPATIENT_CLINIC_OR_DEPARTMENT_OTHER): Payer: Self-pay | Admitting: Orthopaedic Surgery

## 2012-08-30 NOTE — Addendum Note (Signed)
Addendum created 08/30/12 0749 by Lance Coon, CRNA   Modules edited: Charges VN

## 2012-08-30 NOTE — Op Note (Signed)
[name], [name] NO.:  [id number]  MEDICAL RECORD NO.:  [id number]  LOCATION:                               FACILITY:  [facility]  PHYSICIAN:  Lubertha Basque. Christalynn Boise, M.D.DATE OF BIRTH:  11/05/1929  DATE OF PROCEDURE:  08/29/2012 DATE OF DISCHARGE:  08/29/2012                              OPERATIVE REPORT   POSTOPERATIVE DIAGNOSIS: 1. Left knee torn medial and torn lateral meniscus. 2. Left knee degenerative disc disease.  POSTOPERATIVE DIAGNOSIS: 1. Left knee torn medial meniscus. 2. Left knee degenerative disc disease. 3. Left knee cyst.  PROCEDURES: 1. Left knee partial medial meniscectomy. 2. Left knee abrasion chondroplasty, patellofemoral. 3. Left knee with excision of cyst.  ANESTHESIA:  General.  ATTENDING SURGEON:  Lubertha Basque. Jerl Santos, MD  ASSISTANT:  Lindwood Qua, P.A.  INDICATION FOR PROCEDURE:  The patient is an 79 year old woman with many months of persistent left knee pain.  She has been through supervised physical therapy, bracing, pills, and injections.  By ultrasound study, she has medial and lateral meniscus tears.  She has pain which limits her ability to remain active and rest and we have offered her an arthroscopy.  Informed operative consent was obtained after discussion of possible complications including reaction to anesthesia, and DVT as well as infection.  SUMMARY OF FINDINGS AND PROCEDURE:  Under general anesthesia, an arthroscopy of the left knee was performed.  The suprapatellar pouch was benign while the patellofemoral joint exhibited grade 4 change in the intertrochlear groove.  A chondroplasty was done along with abrasion to bleeding bone in some small areas.  In the medial compartment, she had a tiny posterior horn medial meniscus tear addressed about a 5% partial medial meniscectomy.  She had some grade 3 changes in this compartment and a brief chondroplasty was done.  The ACL was intact, but she did have about a 1 cm  sized cyst at the base of this structure, which we excised.  This could potentially been impinging towards full extension. The lateral compartment showed no evidence of meniscal tear, but she did have some grade 4 change in a dime-sized area fairly posterior on the femur and a chondroplasty was done.  She was discharged home the same day.  DESCRIPTION OF PROCEDURE:  The patient was taken to the operating suite where general anesthetic was applied without difficulty.  She was positioned supine and prepped and draped in normal sterile fashion. After administration of IV Kefzol, an appropriate time-out.  Arthroscopy of left knee was performed through total of 2 portals.  Findings were as noted above and procedure consisted of partial medial meniscectomy done with basket followed by excision of the cyst with a shaver and use of the ArthroCare wand.  We then performed the abrasion chondroplasty, patellofemoral.  The knee was thoroughly irrigated followed by placement of Marcaine with epinephrine and Depo-Medrol.  Adaptic was placed over the portals followed by dry gauze and loose Ace wrap.  Estimated blood loss and fluids can be obtained from anesthesia records.  DISPOSITION:  The patient was extubated in the operating room and taken to recovery room in stable condition.  Plans were for to go home same-  day and follow up in the office closely.  I will contact her by phone tonight.     Lubertha Basque Jerl Santos, M.D.     PGD/MEDQ  D:  08/29/2012  T:  08/29/2012  Job:  [id number]

## 2012-09-13 NOTE — Progress Notes (Signed)
This encounter was created in error - please disregard.

## 2012-09-19 ENCOUNTER — Ambulatory Visit (INDEPENDENT_AMBULATORY_CARE_PROVIDER_SITE_OTHER): Payer: Medicare Other | Admitting: Psychology

## 2012-09-19 DIAGNOSIS — F331 Major depressive disorder, recurrent, moderate: Secondary | ICD-10-CM

## 2012-09-20 DIAGNOSIS — M25569 Pain in unspecified knee: Secondary | ICD-10-CM | POA: Diagnosis not present

## 2012-09-20 DIAGNOSIS — J309 Allergic rhinitis, unspecified: Secondary | ICD-10-CM | POA: Diagnosis not present

## 2012-09-21 ENCOUNTER — Encounter: Payer: Self-pay | Admitting: *Deleted

## 2012-09-26 DIAGNOSIS — M25569 Pain in unspecified knee: Secondary | ICD-10-CM | POA: Diagnosis not present

## 2012-09-26 DIAGNOSIS — M1710 Unilateral primary osteoarthritis, unspecified knee: Secondary | ICD-10-CM | POA: Diagnosis not present

## 2012-09-27 ENCOUNTER — Encounter: Payer: Self-pay | Admitting: Internal Medicine

## 2012-09-28 DIAGNOSIS — M1710 Unilateral primary osteoarthritis, unspecified knee: Secondary | ICD-10-CM | POA: Diagnosis not present

## 2012-09-28 DIAGNOSIS — M25569 Pain in unspecified knee: Secondary | ICD-10-CM | POA: Diagnosis not present

## 2012-10-02 ENCOUNTER — Encounter: Payer: Self-pay | Admitting: Internal Medicine

## 2012-10-02 ENCOUNTER — Ambulatory Visit (INDEPENDENT_AMBULATORY_CARE_PROVIDER_SITE_OTHER): Payer: Medicare Other | Admitting: Internal Medicine

## 2012-10-02 VITALS — BP 140/90 | HR 76 | Temp 97.9°F | Resp 16 | Wt 175.0 lb

## 2012-10-02 DIAGNOSIS — I10 Essential (primary) hypertension: Secondary | ICD-10-CM | POA: Diagnosis not present

## 2012-10-02 DIAGNOSIS — K589 Irritable bowel syndrome without diarrhea: Secondary | ICD-10-CM

## 2012-10-02 DIAGNOSIS — E039 Hypothyroidism, unspecified: Secondary | ICD-10-CM

## 2012-10-02 DIAGNOSIS — M1710 Unilateral primary osteoarthritis, unspecified knee: Secondary | ICD-10-CM | POA: Diagnosis not present

## 2012-10-02 DIAGNOSIS — K602 Anal fissure, unspecified: Secondary | ICD-10-CM

## 2012-10-02 DIAGNOSIS — K219 Gastro-esophageal reflux disease without esophagitis: Secondary | ICD-10-CM | POA: Diagnosis not present

## 2012-10-02 DIAGNOSIS — F411 Generalized anxiety disorder: Secondary | ICD-10-CM

## 2012-10-02 NOTE — Assessment & Plan Note (Signed)
Labs  Continue with current prescription therapy as reflected on the Med list.  

## 2012-10-02 NOTE — Assessment & Plan Note (Signed)
She has an appt at [facility] -- Jul 2014 Continue with current prescription therapy as reflected on the Med list.

## 2012-10-02 NOTE — Assessment & Plan Note (Signed)
Per Dr Kinnie Scales

## 2012-10-02 NOTE — Progress Notes (Signed)
Subjective:     HPI     C/o L knee post-op swelling  The patient is here to follow up on chronic IBS worse lately, GERD,  depression, anxiety, SOB symptoms controlled with medicines.  F/u  legs are fatigued. No LBP... C/o ongoing IBS sx's, stress - anal fissure. She saw Dr Kinnie Scales - endoscopy is planned. She is planning to be seen at [facility].    Wt Readings from Last 3 Encounters:  10/02/12 175 lb (79.379 kg)  08/29/12 175 lb 2 oz (79.436 kg)  08/29/12 175 lb 2 oz (79.436 kg)   BP Readings from Last 3 Encounters:  10/02/12 140/90  08/29/12 163/79  08/29/12 163/79      Past Medical History  Diagnosis Date  . Thyroid disease   . Glaucoma   . IBS (irritable bowel syndrome)   . Incontinence   . GERD (gastroesophageal reflux disease)   . Hypertension   . Anxiety   . Depression     Dr Dellia Cloud  . HX: breast cancer   . Osteoarthritis   . Osteopenia   . Bradycardia   . IBS (irritable bowel syndrome)   . Discoid lupus     skin  . Pacemaker     Brady//Chronotropic incompetence with normal pacemaker function  . Full dentures   . Wears hearing aid     both ears   Past Surgical History  Procedure Laterality Date  . Pacemaker placement  2009  . Foot surgery  2004    Left-hammer toes  . Mastectomy  2002    bilat mastectomy-snbx  . Breast lumpectomy  1995    lt -axillary node dissectoin  . Dorsal compartment release  2000    left  . Abdominal hysterectomy    . Eye surgery  2011    cataract-lt  . Knee arthroscopy Left 08/29/2012    Procedure: LEFT KNEE ARTHROSCOPY ;  Surgeon: Velna Ochs, MD;  Location: [facility];  Service: Orthopedics;  Laterality: Left;    reports that she has never smoked. She does not have any smokeless tobacco history on file. She reports that she does not drink alcohol or use illicit drugs. family history includes Cancer in her brother and mother; Heart disease in her brother and father; and Stroke in her brother and  sister. Allergies  Allergen Reactions  . Amlodipine Besy-Benazepril Hcl Swelling  . Amlodipine Besylate Swelling  . Clonidine Hydrochloride     REACTION: tired  . Lactose Intolerance (Gi) Other (See Comments)  . Valsartan   . Verapamil    Current Outpatient Prescriptions on File Prior to Visit  Medication Sig Dispense Refill  . allopurinol (ZYLOPRIM) 300 MG tablet Take 300 mg by mouth daily.       . carboxymethylcellulose (REFRESH PLUS) 0.5 % SOLN 1 drop 3 (three) times daily as needed.      . Cetirizine HCl (ZYRTEC ALLERGY) 10 MG CAPS Take by mouth.        . EPIPEN 2-PAK 0.3 MG/0.3ML DEVI Inject 1 Device into the muscle daily as needed. For allergic reaction      . escitalopram (LEXAPRO) 10 MG tablet Take 1 tablet (10 mg total) by mouth daily.  90 tablet  0  . Garlic TABS Take by mouth daily.      [name] HYDROcodone-acetaminophen (NORCO) 5-325 MG per tablet Take 1 tablet by mouth every 6 (six) hours as needed for pain.  30 tablet  0  . levothyroxine (SYNTHROID, LEVOTHROID) 100  MCG tablet TAKE ONE TABLET BY MOUTH EVERY DAY  90 tablet  2  . LORazepam (ATIVAN) 0.5 MG tablet TAKE ONE TABLET BY MOUTH EVERY DAY AS NEEDED FOR ANXIETY  30 tablet  0  . Magnesium 200 MG TABS Take 400 mg by mouth daily.      [name] oxybutynin (DITROPAN) 5 MG tablet Take 5 mg by mouth 2 (two) times daily.      . pindolol (VISKEN) 5 MG tablet TAKE ONE TABLET BY MOUTH TWICE DAILY  180 tablet  2  . Potassium Gluconate 550 MG TABS Take 1 tablet by mouth daily.      . RABEprazole (ACIPHEX) 20 MG tablet Take 20 mg by mouth 2 (two) times daily.       [name] spironolactone (ALDACTONE) 25 MG tablet Take 25 mg by mouth daily.        No current facility-administered medications on file prior to visit.   Review of Systems Review of Systems  Constitutional: Negative for diaphoresis and unexpected weight change.  HENT: Negative for drooling and tinnitus.   Eyes: Negative for photophobia and visual disturbance.  Respiratory: Negative for  choking and stridor.   Gastrointestinal: Negative for vomiting and blood in stool.  Genitourinary: Negative for hematuria and decreased urine volume.  Musculoskeletal: Negative for gait problem.  Skin: Negative for color change and wound.  Neurological: Negative for tremors and numbness.  Psychiatric/Behavioral: Negative for decreased concentration. The patient is not hyperactive.   Knee pain    Objective:   Physical Exam BP 140/90  Pulse 76  Temp(Src) 97.9 F (36.6 C) (Oral)  Resp 16  Wt 175 lb (79.379 kg)  BMI 30.02 kg/m2 Physical Exam  VS noted Constitutional: Pt appears well-developed and well-nourished.  HENT: Head: Normocephalic.  Right Ear: External ear normal.  Left Ear: External ear normal.  Eyes: Conjunctivae and EOM are normal. Pupils are equal, round, and reactive to light.  Neck: Normal range of motion. Neck supple.  Cardiovascular: Normal rate and regular rhythm.   Pulmonary/Chest: Effort normal and breath sounds normal. no rales or wheezing Abd:  Soft, NT, non-distended, + BS Neurological: Pt is alert. No cranial nerve deficit.  Skin: Skin is warm. No erythema.  Psychiatric: Pt behavior is normal. Thought content normal.  L knee is a little tender to palp CT angio - ok ECHO - OK for age  Lab Results  Component Value Date   WBC 4.3 05/28/2012   HGB 13.2 08/29/2012   HCT 43.1 05/28/2012   PLT 217 05/28/2012   GLUCOSE 83 08/28/2012   CHOL 174 05/11/2010   TRIG 78.0 05/11/2010   HDL 52.30 05/11/2010   LDLDIRECT 130.8 09/30/2006   LDLCALC 106* 05/11/2010   ALT 13 05/28/2012   AST 18 05/28/2012   NA 140 08/28/2012   K 4.9 08/28/2012   CL 103 08/28/2012   CREATININE 1.06 08/28/2012   BUN 14 08/28/2012   CO2 31 08/28/2012   TSH 2.34 04/10/2012   INR 1.00 12/13/2009   HGBA1C 6.0 04/10/2012       Assessment & Plan:

## 2012-10-02 NOTE — Assessment & Plan Note (Signed)
Continue with current prescription therapy as reflected on the Med list.  

## 2012-10-03 ENCOUNTER — Ambulatory Visit: Payer: Medicare Other | Admitting: Psychology

## 2012-10-09 DIAGNOSIS — R197 Diarrhea, unspecified: Secondary | ICD-10-CM | POA: Diagnosis not present

## 2012-10-09 DIAGNOSIS — K602 Anal fissure, unspecified: Secondary | ICD-10-CM | POA: Diagnosis not present

## 2012-10-13 ENCOUNTER — Other Ambulatory Visit: Payer: Self-pay | Admitting: Internal Medicine

## 2012-10-17 ENCOUNTER — Ambulatory Visit (INDEPENDENT_AMBULATORY_CARE_PROVIDER_SITE_OTHER): Payer: Medicare Other | Admitting: Psychology

## 2012-10-17 DIAGNOSIS — F331 Major depressive disorder, recurrent, moderate: Secondary | ICD-10-CM

## 2012-10-20 ENCOUNTER — Other Ambulatory Visit: Payer: Self-pay | Admitting: Internal Medicine

## 2012-10-22 ENCOUNTER — Other Ambulatory Visit: Payer: Self-pay | Admitting: Internal Medicine

## 2012-10-23 DIAGNOSIS — J309 Allergic rhinitis, unspecified: Secondary | ICD-10-CM | POA: Diagnosis not present

## 2012-10-25 DIAGNOSIS — M1710 Unilateral primary osteoarthritis, unspecified knee: Secondary | ICD-10-CM | POA: Diagnosis not present

## 2012-10-25 DIAGNOSIS — M25569 Pain in unspecified knee: Secondary | ICD-10-CM | POA: Diagnosis not present

## 2012-10-30 DIAGNOSIS — M25569 Pain in unspecified knee: Secondary | ICD-10-CM | POA: Diagnosis not present

## 2012-10-30 DIAGNOSIS — M1710 Unilateral primary osteoarthritis, unspecified knee: Secondary | ICD-10-CM | POA: Diagnosis not present

## 2012-10-30 DIAGNOSIS — J309 Allergic rhinitis, unspecified: Secondary | ICD-10-CM | POA: Diagnosis not present

## 2012-10-31 ENCOUNTER — Ambulatory Visit (INDEPENDENT_AMBULATORY_CARE_PROVIDER_SITE_OTHER): Payer: Medicare Other | Admitting: Psychology

## 2012-10-31 DIAGNOSIS — F331 Major depressive disorder, recurrent, moderate: Secondary | ICD-10-CM

## 2012-11-02 ENCOUNTER — Other Ambulatory Visit (HOSPITAL_COMMUNITY): Payer: Self-pay | Admitting: Psychiatry

## 2012-11-02 DIAGNOSIS — F329 Major depressive disorder, single episode, unspecified: Secondary | ICD-10-CM

## 2012-11-06 DIAGNOSIS — M25569 Pain in unspecified knee: Secondary | ICD-10-CM | POA: Diagnosis not present

## 2012-11-06 DIAGNOSIS — M1710 Unilateral primary osteoarthritis, unspecified knee: Secondary | ICD-10-CM | POA: Diagnosis not present

## 2012-11-07 DIAGNOSIS — K589 Irritable bowel syndrome without diarrhea: Secondary | ICD-10-CM | POA: Diagnosis not present

## 2012-11-08 DIAGNOSIS — J309 Allergic rhinitis, unspecified: Secondary | ICD-10-CM | POA: Diagnosis not present

## 2012-11-08 DIAGNOSIS — M25569 Pain in unspecified knee: Secondary | ICD-10-CM | POA: Diagnosis not present

## 2012-11-08 DIAGNOSIS — M1710 Unilateral primary osteoarthritis, unspecified knee: Secondary | ICD-10-CM | POA: Diagnosis not present

## 2012-11-09 ENCOUNTER — Telehealth (HOSPITAL_COMMUNITY): Payer: Self-pay

## 2012-11-09 ENCOUNTER — Telehealth (HOSPITAL_COMMUNITY): Payer: Self-pay | Admitting: Psychiatry

## 2012-11-09 DIAGNOSIS — J309 Allergic rhinitis, unspecified: Secondary | ICD-10-CM | POA: Diagnosis not present

## 2012-11-09 NOTE — Telephone Encounter (Signed)
I spoke to patient .  She's recently prescribed amitriptyline because her doctor told that Lexapro is causing diarrhea.  Patient is not comfortable with amitriptyline.  She call us that if she can try amitriptyline.  Patient does not want any medications that cause weight gain.  I explained about weight gain related to amitriptyline and after that patient decided not to try amitriptyline.  She wants to continue Lexapro and Ativan as prescribed.

## 2012-11-09 NOTE — Telephone Encounter (Signed)
11/09/12 Called and left msg on cell # and called hm# - patient stated that she was given some medication by her GI doctor and she is not sure if she should be taken it and need to speak with Dr. Lolly Mustache.. - please call pt on home [phone].[name] KitchenMarguerite Olea

## 2012-11-14 ENCOUNTER — Ambulatory Visit (INDEPENDENT_AMBULATORY_CARE_PROVIDER_SITE_OTHER): Payer: Medicare Other | Admitting: Psychology

## 2012-11-14 DIAGNOSIS — F331 Major depressive disorder, recurrent, moderate: Secondary | ICD-10-CM | POA: Diagnosis not present

## 2012-11-15 DIAGNOSIS — M1710 Unilateral primary osteoarthritis, unspecified knee: Secondary | ICD-10-CM | POA: Diagnosis not present

## 2012-11-15 DIAGNOSIS — M25569 Pain in unspecified knee: Secondary | ICD-10-CM | POA: Diagnosis not present

## 2012-11-17 DIAGNOSIS — M1710 Unilateral primary osteoarthritis, unspecified knee: Secondary | ICD-10-CM | POA: Diagnosis not present

## 2012-11-17 DIAGNOSIS — M25569 Pain in unspecified knee: Secondary | ICD-10-CM | POA: Diagnosis not present

## 2012-11-20 ENCOUNTER — Ambulatory Visit (INDEPENDENT_AMBULATORY_CARE_PROVIDER_SITE_OTHER): Payer: Medicare Other | Admitting: *Deleted

## 2012-11-20 DIAGNOSIS — I495 Sick sinus syndrome: Secondary | ICD-10-CM

## 2012-11-20 DIAGNOSIS — Z950 Presence of cardiac pacemaker: Secondary | ICD-10-CM | POA: Diagnosis not present

## 2012-11-20 DIAGNOSIS — J309 Allergic rhinitis, unspecified: Secondary | ICD-10-CM | POA: Diagnosis not present

## 2012-11-21 ENCOUNTER — Encounter: Payer: Self-pay | Admitting: Internal Medicine

## 2012-11-21 ENCOUNTER — Ambulatory Visit (HOSPITAL_COMMUNITY): Payer: Self-pay | Admitting: Psychiatry

## 2012-11-21 DIAGNOSIS — M1710 Unilateral primary osteoarthritis, unspecified knee: Secondary | ICD-10-CM | POA: Diagnosis not present

## 2012-11-21 DIAGNOSIS — M25569 Pain in unspecified knee: Secondary | ICD-10-CM | POA: Diagnosis not present

## 2012-11-23 DIAGNOSIS — M1710 Unilateral primary osteoarthritis, unspecified knee: Secondary | ICD-10-CM | POA: Diagnosis not present

## 2012-11-23 DIAGNOSIS — M25569 Pain in unspecified knee: Secondary | ICD-10-CM | POA: Diagnosis not present

## 2012-11-23 DIAGNOSIS — J309 Allergic rhinitis, unspecified: Secondary | ICD-10-CM | POA: Diagnosis not present

## 2012-11-27 ENCOUNTER — Ambulatory Visit (INDEPENDENT_AMBULATORY_CARE_PROVIDER_SITE_OTHER): Payer: Medicare Other | Admitting: Psychiatry

## 2012-11-27 ENCOUNTER — Encounter (HOSPITAL_COMMUNITY): Payer: Self-pay | Admitting: Psychiatry

## 2012-11-27 VITALS — Wt 177.0 lb

## 2012-11-27 DIAGNOSIS — F329 Major depressive disorder, single episode, unspecified: Secondary | ICD-10-CM

## 2012-11-27 MED ORDER — ESCITALOPRAM OXALATE 10 MG PO TABS: 10.0000 mg | ORAL_TABLET | Freq: Every day | ORAL | Status: DC

## 2012-11-27 MED ORDER — LORAZEPAM 0.5 MG PO TABS: ORAL_TABLET | ORAL | Status: DC

## 2012-11-27 NOTE — Progress Notes (Signed)
[facility] [id number] Progress Note  [name] [id number] 79 y.o.  11/27/2012 2:14 PM  Chief Complaint:  Medication management and followup.    History presenting illness Patient is 79 year old [place] American female who came for her appointment.  She is taking Lexapro every day.  She was prescribed amitriptyline by her primary care physician however she refused to take it after reading the side effects and discussed with this Clinical research associate.  Patient told that she has no issues with the sleep and she does not want to take any medication that cause weight gain and drowsiness.  She likes the Lexapro.  She has no issues with the Lexapro.  She has chronic GI symptoms and recently she was referred to see specialist but she was not happy and she is trying to get a second opinion .  She has knee surgery in May and she is recovering from it.  She is happy that her pain level is much better.  She denies any agitation irritability or anger.  She wants to continue Lexapro regularly.  She denies any recent crying spells. She's seeing Dr. Orland Mustard for counseling.  She's not drinking and using any illegal substance.  Suicidal Ideation: No Plan Formed: No Patient has means to carry out plan: No  Homicidal Ideation: No Plan Formed: No Patient has means to carry out plan: No  Review of Systems: Psychiatric: Agitation: No Hallucination: No Depressed Mood: No Insomnia: Yes Hypersomnia: No Altered Concentration: No Feels Worthless: No Grandiose Ideas: No Belief In Special Powers: No New/Increased Substance Abuse: No Compulsions: No  Neurologic: Headache: Yes Seizure: No Paresthesias: No Review of Systems  Gastrointestinal: Positive for nausea and abdominal pain.  Musculoskeletal: Positive for joint pain.  Neurological: Positive for dizziness.  Psychiatric/Behavioral: Negative for suicidal ideas, hallucinations and substance abuse.   Past psychiatric history Patient has one history of  psychiatric inpatient treatment many years ago when she was admitted due to suicidal thinking.  Patient denies any history of suicidal attempt or any psychosis.    Medical history Patient has history of hypothyroidism, vitamin D deficiency, hyperlipidemia, hypertension, allergic rhinitis, GERD, and a fissure, cystitis, chronic diarrhea, afibrillation, osteoporosis, chronic fatigue, history of breast cancer, hearing loss, irritable bladder, chronic knee pain and dysphagia.  Her primary care physician is Dr. Sheran Luz.  Social History: Patient lives alone however she has a good support system.  She has 2 son and one daughter.  Patient has several grandkids.  Outpatient Encounter Prescriptions as of 11/27/2012  Medication Sig Dispense Refill  . escitalopram (LEXAPRO) 10 MG tablet Take 1 tablet (10 mg total) by mouth daily.  90 tablet  0  . LORazepam (ATIVAN) 0.5 MG tablet TAKE ONE TABLET BY MOUTH ONCE DAILY AS NEEDED FOR ANXIETY  30 tablet  0  . [DISCONTINUED] escitalopram (LEXAPRO) 10 MG tablet Take 1 tablet (10 mg total) by mouth daily.  90 tablet  0  . [DISCONTINUED] LORazepam (ATIVAN) 0.5 MG tablet TAKE ONE TABLET BY MOUTH ONCE DAILY AS NEEDED FOR ANXIETY  30 tablet  0  . allopurinol (ZYLOPRIM) 300 MG tablet TAKE ONE TABLET BY MOUTH EVERY DAY  30 tablet  3  . carboxymethylcellulose (REFRESH PLUS) 0.5 % SOLN 1 drop 3 (three) times daily as needed.      . Cetirizine HCl (ZYRTEC ALLERGY) 10 MG CAPS Take by mouth.        . EPIPEN 2-PAK 0.3 MG/0.3ML DEVI Inject 1 Device into the muscle daily as needed. For allergic reaction      .  Garlic TABS Take by mouth daily.      [name] HYDROcodone-acetaminophen (NORCO) 5-325 MG per tablet Take 1 tablet by mouth every 6 (six) hours as needed for pain.  30 tablet  0  . levothyroxine (SYNTHROID, LEVOTHROID) 100 MCG tablet TAKE ONE TABLET BY MOUTH EVERY DAY  90 tablet  2  . Magnesium 200 MG TABS Take 400 mg by mouth daily.      [name] oxybutynin (DITROPAN) 5 MG tablet Take 5  mg by mouth 2 (two) times daily.      . pindolol (VISKEN) 5 MG tablet TAKE ONE TABLET BY MOUTH TWICE DAILY  180 tablet  2  . Potassium Gluconate 550 MG TABS Take 1 tablet by mouth daily.      . RABEprazole (ACIPHEX) 20 MG tablet Take 20 mg by mouth 2 (two) times daily.       [name] spironolactone (ALDACTONE) 25 MG tablet TAKE ONE TABLET BY MOUTH EVERY DAY  30 tablet  5   No facility-administered encounter medications on file as of 11/27/2012.    Past Psychiatric History/Hospitalization(s): Anxiety: Yes Bipolar Disorder: No Depression: Yes Mania: No Psychosis: No Schizophrenia: No Personality Disorder: No Hospitalization for psychiatric illness: Yes History of Electroconvulsive Shock Therapy: No Prior Suicide Attempts: No  Physical Exam: Constitutional:  Wt 177 lb (80.287 kg)  BMI 30.37 kg/m2  Mental status examination Patient is casually dressed and fairly groomed.  She is pleasant and cooperative.  Her speech is clear.  She denies any active or passive suicidal thoughts or homicidal thoughts.  There were no paranoia or delusions present at this time.  She is walking without any support .  She describes her mood as good and her affect is mood appropriate.  Her attention concentration is okay.  She is alert and oriented x3.  Her insight judgment and impulse control is okay.  Medical Decision Making (Choose Three): Established Problem, Stable/Improving (1), Review of Last Therapy Session (1) and Review of Medication Regimen & Side Effects (2)  Assessment: Axis I: Maj. depressive disorder  Axis II: Deferred  Axis III: See medical history  Axis IV: Mild to moderate  Axis V: 60-65   Plan:  I will continue Lexapro 10 mg daily and Ativan 0.5 mg as needed.  Recommend to call us back if she has any question or concern.  I will see her again in 3 months.  Bobbijo Holst T., MD 11/27/2012

## 2012-11-28 ENCOUNTER — Ambulatory Visit: Payer: Medicare Other | Admitting: Psychology

## 2012-11-28 DIAGNOSIS — M1710 Unilateral primary osteoarthritis, unspecified knee: Secondary | ICD-10-CM | POA: Diagnosis not present

## 2012-11-28 DIAGNOSIS — J309 Allergic rhinitis, unspecified: Secondary | ICD-10-CM | POA: Diagnosis not present

## 2012-11-28 DIAGNOSIS — M25569 Pain in unspecified knee: Secondary | ICD-10-CM | POA: Diagnosis not present

## 2012-11-28 LAB — REMOTE PACEMAKER DEVICE
AL IMPEDENCE PM: 487 Ohm
BATTERY VOLTAGE: 2.77 V
RV LEAD AMPLITUDE: 16 mv
RV LEAD IMPEDENCE PM: 541 Ohm
VENTRICULAR PACING PM: 0

## 2012-11-30 DIAGNOSIS — J309 Allergic rhinitis, unspecified: Secondary | ICD-10-CM | POA: Diagnosis not present

## 2012-12-06 DIAGNOSIS — J309 Allergic rhinitis, unspecified: Secondary | ICD-10-CM | POA: Diagnosis not present

## 2012-12-11 ENCOUNTER — Encounter: Payer: Self-pay | Admitting: Internal Medicine

## 2012-12-12 ENCOUNTER — Ambulatory Visit (INDEPENDENT_AMBULATORY_CARE_PROVIDER_SITE_OTHER): Payer: Medicare Other | Admitting: Psychology

## 2012-12-12 DIAGNOSIS — F331 Major depressive disorder, recurrent, moderate: Secondary | ICD-10-CM

## 2012-12-13 DIAGNOSIS — J309 Allergic rhinitis, unspecified: Secondary | ICD-10-CM | POA: Diagnosis not present

## 2012-12-18 ENCOUNTER — Telehealth (HOSPITAL_COMMUNITY): Payer: Self-pay

## 2012-12-18 DIAGNOSIS — K6289 Other specified diseases of anus and rectum: Secondary | ICD-10-CM | POA: Diagnosis not present

## 2012-12-18 DIAGNOSIS — K589 Irritable bowel syndrome without diarrhea: Secondary | ICD-10-CM | POA: Diagnosis not present

## 2012-12-18 DIAGNOSIS — K602 Anal fissure, unspecified: Secondary | ICD-10-CM | POA: Diagnosis not present

## 2012-12-18 NOTE — Telephone Encounter (Signed)
Patient recently seen primary care physician who recommended to stop Lexapro and try amitriptyline.  She was told that Lexapro is causing diarrhea.  Patient does not believe the Lexapro is causing diarrhea however I recommend to stop Lexapro and try amitriptyline 25 mg for one week to see if diarrhea stops.  The patient has a prescription amitriptyline 25 mg given by Dr. Kinnie Scales.  Patient will call us in one week about the results.

## 2012-12-21 DIAGNOSIS — J309 Allergic rhinitis, unspecified: Secondary | ICD-10-CM | POA: Diagnosis not present

## 2012-12-26 ENCOUNTER — Ambulatory Visit (INDEPENDENT_AMBULATORY_CARE_PROVIDER_SITE_OTHER): Payer: Medicare Other | Admitting: Psychology

## 2012-12-26 DIAGNOSIS — F331 Major depressive disorder, recurrent, moderate: Secondary | ICD-10-CM | POA: Diagnosis not present

## 2013-01-01 ENCOUNTER — Other Ambulatory Visit (INDEPENDENT_AMBULATORY_CARE_PROVIDER_SITE_OTHER): Payer: Medicare Other

## 2013-01-01 DIAGNOSIS — I10 Essential (primary) hypertension: Secondary | ICD-10-CM | POA: Diagnosis not present

## 2013-01-01 DIAGNOSIS — K219 Gastro-esophageal reflux disease without esophagitis: Secondary | ICD-10-CM

## 2013-01-01 DIAGNOSIS — F411 Generalized anxiety disorder: Secondary | ICD-10-CM

## 2013-01-01 DIAGNOSIS — K602 Anal fissure, unspecified: Secondary | ICD-10-CM

## 2013-01-01 DIAGNOSIS — E039 Hypothyroidism, unspecified: Secondary | ICD-10-CM | POA: Diagnosis not present

## 2013-01-01 DIAGNOSIS — K589 Irritable bowel syndrome without diarrhea: Secondary | ICD-10-CM

## 2013-01-01 LAB — CBC WITH DIFFERENTIAL/PLATELET
Basophils Absolute: 0 10*3/uL (ref 0.0–0.1)
Eosinophils Relative: 2 % (ref 0.0–5.0)
MCV: 97.4 fl (ref 78.0–100.0)
Monocytes Absolute: 0.3 10*3/uL (ref 0.1–1.0)
Monocytes Relative: 7.2 % (ref 3.0–12.0)
Neutrophils Relative %: 67.2 % (ref 43.0–77.0)
Platelets: 215 10*3/uL (ref 150.0–400.0)
RDW: 14.4 % (ref 11.5–14.6)
WBC: 4.2 10*3/uL — ABNORMAL LOW (ref 4.5–10.5)

## 2013-01-01 LAB — BASIC METABOLIC PANEL
BUN: 13 mg/dL (ref 6–23)
CO2: 30 mEq/L (ref 19–32)
Chloride: 104 mEq/L (ref 96–112)
Creatinine, Ser: 0.9 mg/dL (ref 0.4–1.2)

## 2013-01-01 LAB — TSH: TSH: 1.51 u[IU]/mL (ref 0.35–5.50)

## 2013-01-01 LAB — HEPATIC FUNCTION PANEL
ALT: 12 U/L (ref 0–35)
Total Protein: 6.8 g/dL (ref 6.0–8.3)

## 2013-01-02 ENCOUNTER — Ambulatory Visit (INDEPENDENT_AMBULATORY_CARE_PROVIDER_SITE_OTHER): Payer: Medicare Other | Admitting: Internal Medicine

## 2013-01-02 ENCOUNTER — Encounter: Payer: Self-pay | Admitting: Internal Medicine

## 2013-01-02 VITALS — BP 160/90 | HR 80 | Temp 98.3°F | Resp 16 | Wt 178.0 lb

## 2013-01-02 DIAGNOSIS — K602 Anal fissure, unspecified: Secondary | ICD-10-CM

## 2013-01-02 DIAGNOSIS — K589 Irritable bowel syndrome without diarrhea: Secondary | ICD-10-CM

## 2013-01-02 DIAGNOSIS — R252 Cramp and spasm: Secondary | ICD-10-CM

## 2013-01-02 MED ORDER — TRIAMCINOLONE ACETONIDE 0.5 % EX OINT: TOPICAL_OINTMENT | Freq: Two times a day (BID) | CUTANEOUS | Status: DC

## 2013-01-02 MED ORDER — COLESEVELAM HCL 3.75 G PO PACK: PACK | ORAL | Status: DC

## 2013-01-02 NOTE — Progress Notes (Signed)
Subjective:     HPI     C/o L knee post-op swelling  The patient is here to follow up on chronic IBS worse lately, GERD,  depression, anxiety, SOB symptoms controlled with medicines.  F/u  legs are fatigued. No LBP... C/o ongoing IBS sx's, stress - anal fissure. She saw Dr Kinnie Scales - endoscopy is planned. She is planning to be seen at [facility].    Wt Readings from Last 3 Encounters:  01/02/13 178 lb (80.74 kg)  11/27/12 177 lb (80.287 kg)  10/02/12 175 lb (79.379 kg)   BP Readings from Last 3 Encounters:  01/02/13 160/90  10/02/12 140/90  08/29/12 163/79      Past Medical History  Diagnosis Date  . Thyroid disease   . Glaucoma   . IBS (irritable bowel syndrome)   . Incontinence   . GERD (gastroesophageal reflux disease)   . Hypertension   . Anxiety   . Depression     Dr Dellia Cloud  . HX: breast cancer   . Osteoarthritis   . Osteopenia   . Bradycardia   . IBS (irritable bowel syndrome)   . Discoid lupus     skin  . Pacemaker     Brady//Chronotropic incompetence with normal pacemaker function  . Full dentures   . Wears hearing aid     both ears   Past Surgical History  Procedure Laterality Date  . Pacemaker placement  2009  . Foot surgery  2004    Left-hammer toes  . Mastectomy  2002    bilat mastectomy-snbx  . Breast lumpectomy  1995    lt -axillary node dissectoin  . Dorsal compartment release  2000    left  . Abdominal hysterectomy    . Eye surgery  2011    cataract-lt  . Knee arthroscopy Left 08/29/2012    Procedure: LEFT KNEE ARTHROSCOPY ;  Surgeon: Velna Ochs, MD;  Location: [facility];  Service: Orthopedics;  Laterality: Left;    reports that she has never smoked. She does not have any smokeless tobacco history on file. She reports that she does not drink alcohol or use illicit drugs. family history includes Cancer in her brother and mother; Heart disease in her brother and father; Stroke in her brother and sister. Allergies   Allergen Reactions  . Amlodipine Besy-Benazepril Hcl Swelling  . Amlodipine Besylate Swelling  . Clonidine Hydrochloride     REACTION: tired  . Lactose Intolerance (Gi) Other (See Comments)  . Valsartan   . Verapamil    Current Outpatient Prescriptions on File Prior to Visit  Medication Sig Dispense Refill  . allopurinol (ZYLOPRIM) 300 MG tablet TAKE ONE TABLET BY MOUTH EVERY DAY  30 tablet  3  . carboxymethylcellulose (REFRESH PLUS) 0.5 % SOLN 1 drop 3 (three) times daily as needed.      . Cetirizine HCl (ZYRTEC ALLERGY) 10 MG CAPS Take by mouth.        . EPIPEN 2-PAK 0.3 MG/0.3ML DEVI Inject 1 Device into the muscle daily as needed. For allergic reaction      . escitalopram (LEXAPRO) 10 MG tablet Take 1 tablet (10 mg total) by mouth daily.  90 tablet  0  . Garlic TABS Take by mouth daily.      [name] levothyroxine (SYNTHROID, LEVOTHROID) 100 MCG tablet TAKE ONE TABLET BY MOUTH EVERY DAY  90 tablet  2  . LORazepam (ATIVAN) 0.5 MG tablet TAKE ONE TABLET BY MOUTH ONCE DAILY AS NEEDED  FOR ANXIETY  30 tablet  0  . Magnesium 200 MG TABS Take 400 mg by mouth daily.      [name] oxybutynin (DITROPAN) 5 MG tablet Take 5 mg by mouth 2 (two) times daily.      . pindolol (VISKEN) 5 MG tablet TAKE ONE TABLET BY MOUTH TWICE DAILY  180 tablet  2  . Potassium Gluconate 550 MG TABS Take 1 tablet by mouth daily.      . RABEprazole (ACIPHEX) 20 MG tablet Take 20 mg by mouth 2 (two) times daily.       [name] spironolactone (ALDACTONE) 25 MG tablet TAKE ONE TABLET BY MOUTH EVERY DAY  30 tablet  5  . HYDROcodone-acetaminophen (NORCO) 5-325 MG per tablet Take 1 tablet by mouth every 6 (six) hours as needed for pain.  30 tablet  0   No current facility-administered medications on file prior to visit.   Review of Systems Review of Systems  Constitutional: Negative for diaphoresis and unexpected weight change.  HENT: Negative for drooling and tinnitus.   Eyes: Negative for photophobia and visual disturbance.   Respiratory: Negative for choking and stridor.   Gastrointestinal: Negative for vomiting and blood in stool.  Genitourinary: Negative for hematuria and decreased urine volume.  Musculoskeletal: Negative for gait problem.  Skin: Negative for color change and wound.  Neurological: Negative for tremors and numbness.  Psychiatric/Behavioral: Negative for decreased concentration. The patient is not hyperactive.   Knee pain    Objective:   Physical Exam BP 160/90  Pulse 80  Temp(Src) 98.3 F (36.8 C) (Oral)  Resp 16  Wt 178 lb (80.74 kg)  BMI 30.54 kg/m2  SpO2 97% Physical Exam  VS noted Constitutional: Pt appears well-developed and well-nourished.  HENT: Head: Normocephalic.  Right Ear: External ear normal.  Left Ear: External ear normal.  Eyes: Conjunctivae and EOM are normal. Pupils are equal, round, and reactive to light.  Neck: Normal range of motion. Neck supple.  Cardiovascular: Normal rate and regular rhythm.   Pulmonary/Chest: Effort normal and breath sounds normal. no rales or wheezing Abd:  Soft, NT, non-distended, + BS Neurological: Pt is alert. No cranial nerve deficit.  Skin: Skin is warm. No erythema.  Psychiatric: Pt behavior is normal. Thought content normal.  L knee is a little tender to palp CT angio - ok ECHO - OK for age  Lab Results  Component Value Date   WBC 4.2* 01/01/2013   HGB 13.4 01/01/2013   HCT 40.2 01/01/2013   PLT 215.0 01/01/2013   GLUCOSE 95 01/01/2013   CHOL 174 05/11/2010   TRIG 78.0 05/11/2010   HDL 52.30 05/11/2010   LDLDIRECT 130.8 09/30/2006   LDLCALC 106* 05/11/2010   ALT 12 01/01/2013   AST 19 01/01/2013   NA 139 01/01/2013   K 4.2 01/01/2013   CL 104 01/01/2013   CREATININE 0.9 01/01/2013   BUN 13 01/01/2013   CO2 30 01/01/2013   TSH 1.51 01/01/2013   INR 1.00 12/13/2009   HGBA1C 6.0 04/10/2012       Assessment & Plan:

## 2013-01-02 NOTE — Assessment & Plan Note (Addendum)
Continue with current prescription therapy as reflected on the Med list. She wants to go to [facility] Will try Welchol qd

## 2013-01-02 NOTE — Assessment & Plan Note (Signed)
2014 due to spironolactone Ok to use it q 2-3 d

## 2013-01-02 NOTE — Patient Instructions (Addendum)
Take Spironolactone once every 2-3 days  Gluten free trial (no wheat products) for 4-6 weeks. OK to use gluten-free bread and gluten-free pasta.  Milk free trial (no milk, ice cream, cheese and yogurt) for 4-6 weeks. OK to use almond, coconut, rice or soy milk. "Almond breeze" brand tastes good.

## 2013-01-02 NOTE — Assessment & Plan Note (Signed)
Triamc oint - bid

## 2013-01-08 DIAGNOSIS — M25569 Pain in unspecified knee: Secondary | ICD-10-CM | POA: Diagnosis not present

## 2013-01-08 DIAGNOSIS — J309 Allergic rhinitis, unspecified: Secondary | ICD-10-CM | POA: Diagnosis not present

## 2013-01-08 DIAGNOSIS — M25559 Pain in unspecified hip: Secondary | ICD-10-CM | POA: Diagnosis not present

## 2013-01-23 ENCOUNTER — Ambulatory Visit (INDEPENDENT_AMBULATORY_CARE_PROVIDER_SITE_OTHER): Payer: Medicare Other | Admitting: Psychology

## 2013-01-23 DIAGNOSIS — F331 Major depressive disorder, recurrent, moderate: Secondary | ICD-10-CM

## 2013-02-06 ENCOUNTER — Ambulatory Visit (INDEPENDENT_AMBULATORY_CARE_PROVIDER_SITE_OTHER): Payer: Medicare Other | Admitting: Psychology

## 2013-02-06 DIAGNOSIS — F331 Major depressive disorder, recurrent, moderate: Secondary | ICD-10-CM | POA: Diagnosis not present

## 2013-02-07 DIAGNOSIS — M25569 Pain in unspecified knee: Secondary | ICD-10-CM | POA: Diagnosis not present

## 2013-02-07 DIAGNOSIS — M6530 Trigger finger, unspecified finger: Secondary | ICD-10-CM | POA: Diagnosis not present

## 2013-02-15 DIAGNOSIS — J309 Allergic rhinitis, unspecified: Secondary | ICD-10-CM | POA: Diagnosis not present

## 2013-02-15 DIAGNOSIS — J3089 Other allergic rhinitis: Secondary | ICD-10-CM | POA: Diagnosis not present

## 2013-02-15 DIAGNOSIS — J301 Allergic rhinitis due to pollen: Secondary | ICD-10-CM | POA: Diagnosis not present

## 2013-02-20 ENCOUNTER — Ambulatory Visit (INDEPENDENT_AMBULATORY_CARE_PROVIDER_SITE_OTHER): Payer: Medicare Other | Admitting: Psychology

## 2013-02-20 DIAGNOSIS — F331 Major depressive disorder, recurrent, moderate: Secondary | ICD-10-CM | POA: Diagnosis not present

## 2013-02-22 ENCOUNTER — Other Ambulatory Visit (HOSPITAL_COMMUNITY): Payer: Self-pay | Admitting: Psychiatry

## 2013-02-22 DIAGNOSIS — F329 Major depressive disorder, single episode, unspecified: Secondary | ICD-10-CM

## 2013-02-26 ENCOUNTER — Ambulatory Visit (INDEPENDENT_AMBULATORY_CARE_PROVIDER_SITE_OTHER): Payer: Medicare Other | Admitting: *Deleted

## 2013-02-26 ENCOUNTER — Other Ambulatory Visit (HOSPITAL_COMMUNITY): Payer: Self-pay | Admitting: *Deleted

## 2013-02-26 DIAGNOSIS — Z950 Presence of cardiac pacemaker: Secondary | ICD-10-CM

## 2013-02-26 DIAGNOSIS — I495 Sick sinus syndrome: Secondary | ICD-10-CM | POA: Diagnosis not present

## 2013-02-27 ENCOUNTER — Encounter: Payer: Self-pay | Admitting: Internal Medicine

## 2013-02-27 ENCOUNTER — Encounter (HOSPITAL_COMMUNITY): Payer: Self-pay | Admitting: Psychiatry

## 2013-02-27 ENCOUNTER — Ambulatory Visit (INDEPENDENT_AMBULATORY_CARE_PROVIDER_SITE_OTHER): Payer: Medicare Other | Admitting: Psychiatry

## 2013-02-27 VITALS — BP 132/88 | HR 82 | Ht 62.0 in | Wt 177.0 lb

## 2013-02-27 DIAGNOSIS — F329 Major depressive disorder, single episode, unspecified: Secondary | ICD-10-CM | POA: Diagnosis not present

## 2013-02-27 LAB — REMOTE PACEMAKER DEVICE
AL AMPLITUDE: 2.8 mv
AL IMPEDENCE PM: 466 Ohm
AL THRESHOLD: 0.75 V
RV LEAD AMPLITUDE: 16 mv
RV LEAD IMPEDENCE PM: 495 Ohm
VENTRICULAR PACING PM: 1

## 2013-02-27 MED ORDER — ESCITALOPRAM OXALATE 10 MG PO TABS: 10.0000 mg | ORAL_TABLET | Freq: Every day | ORAL | Status: DC

## 2013-02-27 NOTE — Progress Notes (Signed)
[facility] [id number] Progress Note  [name] [id number] 79 y.o.  02/27/2013 10:59 AM  Chief Complaint:  Medication management and followup.    History presenting illness The patient came for her followup appointment.  She's compliant with her Lexapro and Ativan as needed for severe anxiety.  She is concerned about her IBS is scheduled to see a specialist next week at [facility].  Patient admitted sometime feeling sad and depressed because of the age otherwise no new issues.  She denies any suicidal thoughts.  She denies any aggression, violence or agitation.  She admitted sometime crying spells however they're less intense and less frequent than in the past.  She denied any side effects of medication.  She is seeing therapist Dr Rich Number for counseling.  She's not drinking or using any illegal substance.  Suicidal Ideation: No Plan Formed: No Patient has means to carry out plan: No  Homicidal Ideation: No Plan Formed: No Patient has means to carry out plan: No  Review of Systems: Psychiatric: Agitation: No Hallucination: No Depressed Mood: No Insomnia: Yes Hypersomnia: No Altered Concentration: No Feels Worthless: No Grandiose Ideas: No Belief In Special Powers: No New/Increased Substance Abuse: No Compulsions: No  Neurologic: Headache: Yes Seizure: No Paresthesias: No Review of Systems  Gastrointestinal: Positive for nausea and abdominal pain.  Musculoskeletal: Positive for joint pain.  Neurological: Positive for dizziness.  Psychiatric/Behavioral: Negative for suicidal ideas, hallucinations and substance abuse.   Past psychiatric history Patient has one history of psychiatric inpatient treatment many years ago when she was admitted due to suicidal thinking.  Patient denies any history of suicidal attempt or any psychosis.    Medical history Patient has history of hypothyroidism, vitamin D deficiency, hyperlipidemia, hypertension, allergic rhinitis, GERD,  and a fissure, cystitis, chronic diarrhea, afibrillation, osteoporosis, chronic fatigue, history of breast cancer, hearing loss, irritable bladder, chronic knee pain and dysphagia.  Her primary care physician is Dr. Sheran Luz.  Social History:  Patient lives alone however she has a good support system.  She has 2 son and one daughter.  Patient has several grandkids.  Outpatient Encounter Prescriptions as of 02/27/2013  Medication Sig  . escitalopram (LEXAPRO) 10 MG tablet Take 1 tablet (10 mg total) by mouth daily.  [name] LORazepam (ATIVAN) 0.5 MG tablet TAKE ONE TABLET BY MOUTH ONCE DAILY AS NEEDED FOR ANXIETY  . [DISCONTINUED] escitalopram (LEXAPRO) 10 MG tablet Take 1 tablet (10 mg total) by mouth daily.  [name] allopurinol (ZYLOPRIM) 300 MG tablet TAKE ONE TABLET BY MOUTH EVERY DAY  . carboxymethylcellulose (REFRESH PLUS) 0.5 % SOLN 1 drop 3 (three) times daily as needed.  . Cetirizine HCl (ZYRTEC ALLERGY) 10 MG CAPS Take by mouth.    . Colesevelam HCl (WELCHOL) 3.75 G PACK Use qd  . EPIPEN 2-PAK 0.3 MG/0.3ML DEVI Inject 1 Device into the muscle daily as needed. For allergic reaction  . Garlic TABS Take by mouth daily.  [name] HYDROcodone-acetaminophen (NORCO) 5-325 MG per tablet Take 1 tablet by mouth every 6 (six) hours as needed for pain.  [name] levothyroxine (SYNTHROID, LEVOTHROID) 100 MCG tablet TAKE ONE TABLET BY MOUTH EVERY DAY  . Magnesium 200 MG TABS Take 400 mg by mouth daily.  [name] oxybutynin (DITROPAN) 5 MG tablet Take 5 mg by mouth 2 (two) times daily.  . pindolol (VISKEN) 5 MG tablet TAKE ONE TABLET BY MOUTH TWICE DAILY  . Potassium Gluconate 550 MG TABS Take 1 tablet by mouth daily.  . RABEprazole (ACIPHEX) 20 MG tablet Take  20 mg by mouth 2 (two) times daily.   [name] spironolactone (ALDACTONE) 25 MG tablet TAKE ONE TABLET BY MOUTH EVERY DAY  . triamcinolone ointment (KENALOG) 0.5 % Apply topically 2 (two) times daily.    Past Psychiatric History/Hospitalization(s): Anxiety: Yes Bipolar  Disorder: No Depression: Yes Mania: No Psychosis: No Schizophrenia: No Personality Disorder: No Hospitalization for psychiatric illness: Yes History of Electroconvulsive Shock Therapy: No Prior Suicide Attempts: No  Physical Exam: Constitutional:  BP 132/88  Pulse 82  Ht 5\' 2"  (1.575 m)  Wt 177 lb (80.287 kg)  BMI 32.37 kg/m2  Mental status examination Patient is casually dressed and fairly groomed.  She is pleasant and cooperative.  Her speech is clear.  She denies any active or passive suicidal thoughts or homicidal thoughts.  There were no paranoia or delusions present at this time.  She describes her mood as good and her affect is mood appropriate.  Her attention concentration is okay.  She is alert and oriented x3.  Her insight judgment and impulse control is okay.  Medical Decision Making (Choose Three): Established Problem, Stable/Improving (1), Review of Last Therapy Session (1) and Review of Medication Regimen & Side Effects (2)  Assessment: Axis I: Maj. depressive disorder  Axis II: Deferred  Axis III: See medical history  Axis IV: Mild to moderate  Axis V: 60-65   Plan:  I will continue Lexapro 10 mg daily and Ativan 0.5 mg as needed.  Recommend to call us back if she has any question or concern.  I will see her again in 3 months.  ARFEEN,SYED T., MD 02/27/2013

## 2013-03-01 DIAGNOSIS — K589 Irritable bowel syndrome without diarrhea: Secondary | ICD-10-CM | POA: Diagnosis not present

## 2013-03-02 ENCOUNTER — Telehealth (HOSPITAL_COMMUNITY): Payer: Self-pay

## 2013-03-05 DIAGNOSIS — M899 Disorder of bone, unspecified: Secondary | ICD-10-CM | POA: Diagnosis not present

## 2013-03-05 NOTE — Telephone Encounter (Signed)
Patient called and requested to speak to the provider because she was told by her GI physician to try amitriptyline instead of Lexapro however patient is concerned about the side effects.  I explained that amitriptyline has more potential to cause weight gain as compared to Lexapro.  Patient decided not to take amitriptyline and to like to continue Lexapro.

## 2013-03-06 ENCOUNTER — Ambulatory Visit: Payer: Medicare Other | Admitting: Psychology

## 2013-03-13 ENCOUNTER — Other Ambulatory Visit: Payer: Self-pay | Admitting: Internal Medicine

## 2013-03-13 ENCOUNTER — Ambulatory Visit: Payer: Medicare Other | Admitting: Psychology

## 2013-03-14 DIAGNOSIS — M25549 Pain in joints of unspecified hand: Secondary | ICD-10-CM | POA: Diagnosis not present

## 2013-03-14 DIAGNOSIS — M25569 Pain in unspecified knee: Secondary | ICD-10-CM | POA: Diagnosis not present

## 2013-03-20 ENCOUNTER — Ambulatory Visit (INDEPENDENT_AMBULATORY_CARE_PROVIDER_SITE_OTHER): Payer: Medicare Other | Admitting: Psychology

## 2013-03-20 DIAGNOSIS — F331 Major depressive disorder, recurrent, moderate: Secondary | ICD-10-CM

## 2013-03-21 DIAGNOSIS — J309 Allergic rhinitis, unspecified: Secondary | ICD-10-CM | POA: Diagnosis not present

## 2013-04-03 ENCOUNTER — Encounter: Payer: Self-pay | Admitting: Internal Medicine

## 2013-04-03 ENCOUNTER — Ambulatory Visit (INDEPENDENT_AMBULATORY_CARE_PROVIDER_SITE_OTHER): Payer: Medicare Other | Admitting: Psychology

## 2013-04-03 ENCOUNTER — Ambulatory Visit (INDEPENDENT_AMBULATORY_CARE_PROVIDER_SITE_OTHER): Payer: Medicare Other | Admitting: Internal Medicine

## 2013-04-03 VITALS — BP 148/100 | HR 80 | Temp 98.4°F | Resp 16 | Wt 177.0 lb

## 2013-04-03 DIAGNOSIS — F331 Major depressive disorder, recurrent, moderate: Secondary | ICD-10-CM | POA: Diagnosis not present

## 2013-04-03 DIAGNOSIS — R0609 Other forms of dyspnea: Secondary | ICD-10-CM

## 2013-04-03 DIAGNOSIS — R7309 Other abnormal glucose: Secondary | ICD-10-CM

## 2013-04-03 DIAGNOSIS — I10 Essential (primary) hypertension: Secondary | ICD-10-CM

## 2013-04-03 DIAGNOSIS — M1990 Unspecified osteoarthritis, unspecified site: Secondary | ICD-10-CM

## 2013-04-03 DIAGNOSIS — E039 Hypothyroidism, unspecified: Secondary | ICD-10-CM | POA: Diagnosis not present

## 2013-04-03 DIAGNOSIS — R0600 Dyspnea, unspecified: Secondary | ICD-10-CM

## 2013-04-03 DIAGNOSIS — I495 Sick sinus syndrome: Secondary | ICD-10-CM

## 2013-04-03 DIAGNOSIS — F429 Obsessive-compulsive disorder, unspecified: Secondary | ICD-10-CM

## 2013-04-03 NOTE — Assessment & Plan Note (Signed)
Dr Lolly Mustache Dr Dellia Cloud

## 2013-04-03 NOTE — Progress Notes (Signed)
Pre visit review using our clinic review tool, if applicable. No additional management support is needed unless otherwise documented below in the visit note. 

## 2013-04-03 NOTE — Assessment & Plan Note (Signed)
Continue with current prescription therapy as reflected on the Med list.  

## 2013-04-03 NOTE — Assessment & Plan Note (Signed)
Monitoring labs  Wt Readings from Last 3 Encounters:  04/03/13 177 lb (80.287 kg)  02/27/13 177 lb (80.287 kg)  01/02/13 178 lb (80.74 kg)

## 2013-04-03 NOTE — Assessment & Plan Note (Signed)
Stressed out lately  Continue with current prescription therapy as reflected on the Med list.

## 2013-04-03 NOTE — Assessment & Plan Note (Signed)
No change 

## 2013-04-03 NOTE — Assessment & Plan Note (Signed)
Pt is seeing Dr Graciela Husbands

## 2013-04-04 ENCOUNTER — Encounter: Payer: Self-pay | Admitting: *Deleted

## 2013-04-09 ENCOUNTER — Other Ambulatory Visit: Payer: Self-pay | Admitting: Internal Medicine

## 2013-04-11 DIAGNOSIS — J309 Allergic rhinitis, unspecified: Secondary | ICD-10-CM | POA: Diagnosis not present

## 2013-04-17 ENCOUNTER — Encounter: Payer: Self-pay | Admitting: Internal Medicine

## 2013-04-17 ENCOUNTER — Ambulatory Visit: Payer: Medicare Other | Admitting: Psychology

## 2013-04-27 ENCOUNTER — Other Ambulatory Visit (HOSPITAL_COMMUNITY): Payer: Self-pay | Admitting: Psychiatry

## 2013-04-27 DIAGNOSIS — F329 Major depressive disorder, single episode, unspecified: Secondary | ICD-10-CM

## 2013-04-30 ENCOUNTER — Telehealth (HOSPITAL_COMMUNITY): Payer: Self-pay | Admitting: *Deleted

## 2013-04-30 ENCOUNTER — Other Ambulatory Visit (HOSPITAL_COMMUNITY): Payer: Self-pay | Admitting: Psychiatry

## 2013-04-30 DIAGNOSIS — H43819 Vitreous degeneration, unspecified eye: Secondary | ICD-10-CM | POA: Diagnosis not present

## 2013-04-30 DIAGNOSIS — H47239 Glaucomatous optic atrophy, unspecified eye: Secondary | ICD-10-CM | POA: Diagnosis not present

## 2013-04-30 DIAGNOSIS — J309 Allergic rhinitis, unspecified: Secondary | ICD-10-CM | POA: Diagnosis not present

## 2013-04-30 DIAGNOSIS — H2510 Age-related nuclear cataract, unspecified eye: Secondary | ICD-10-CM | POA: Diagnosis not present

## 2013-04-30 DIAGNOSIS — H401290 Low-tension glaucoma, unspecified eye, stage unspecified: Secondary | ICD-10-CM | POA: Diagnosis not present

## 2013-04-30 NOTE — Telephone Encounter (Signed)
Fax for refill of lorazepam that was filled earlier in day 04/30/13.

## 2013-04-30 NOTE — Telephone Encounter (Signed)
Prescription had been called in earlier today. This refill request is duplicate.

## 2013-04-30 NOTE — Telephone Encounter (Signed)
Refill was called in to pharmacy earlier today 04/30/13.

## 2013-04-30 NOTE — Telephone Encounter (Signed)
Chart reviewed, Refill appropriate. Called in to [place] at pt pharmacy. Notified patient.

## 2013-05-01 ENCOUNTER — Ambulatory Visit (INDEPENDENT_AMBULATORY_CARE_PROVIDER_SITE_OTHER): Payer: Medicare Other | Admitting: Psychology

## 2013-05-01 DIAGNOSIS — F331 Major depressive disorder, recurrent, moderate: Secondary | ICD-10-CM | POA: Diagnosis not present

## 2013-05-07 ENCOUNTER — Ambulatory Visit (INDEPENDENT_AMBULATORY_CARE_PROVIDER_SITE_OTHER): Payer: Medicare Other | Admitting: Internal Medicine

## 2013-05-07 ENCOUNTER — Encounter: Payer: Self-pay | Admitting: Internal Medicine

## 2013-05-07 VITALS — BP 141/88 | HR 67 | Ht 62.0 in | Wt 180.0 lb

## 2013-05-07 DIAGNOSIS — I495 Sick sinus syndrome: Secondary | ICD-10-CM

## 2013-05-07 DIAGNOSIS — Z950 Presence of cardiac pacemaker: Secondary | ICD-10-CM

## 2013-05-07 DIAGNOSIS — I4891 Unspecified atrial fibrillation: Secondary | ICD-10-CM | POA: Diagnosis not present

## 2013-05-07 DIAGNOSIS — J309 Allergic rhinitis, unspecified: Secondary | ICD-10-CM | POA: Diagnosis not present

## 2013-05-07 LAB — MDC_IDC_ENUM_SESS_TYPE_INCLINIC
Brady Statistic AP VP Percent: 0 %
Brady Statistic AP VS Percent: 80 %
Brady Statistic AS VP Percent: 1 %
Brady Statistic AS VS Percent: 19 %
Lead Channel Impedance Value: 553 Ohm
Lead Channel Pacing Threshold Amplitude: 0.5 V
Lead Channel Pacing Threshold Amplitude: 0.75 V
Lead Channel Pacing Threshold Pulse Width: 0.4 ms
Lead Channel Pacing Threshold Pulse Width: 0.4 ms
Lead Channel Sensing Intrinsic Amplitude: 5.6 mV
Lead Channel Setting Pacing Amplitude: 2 V
Lead Channel Setting Pacing Amplitude: 2.5 V
Lead Channel Setting Sensing Sensitivity: 2.8 mV
MDC IDC MSMT BATTERY IMPEDANCE: 960 Ohm
MDC IDC MSMT BATTERY REMAINING LONGEVITY: 59 mo
MDC IDC MSMT BATTERY VOLTAGE: 2.77 V
MDC IDC MSMT LEADCHNL RA IMPEDANCE VALUE: 467 Ohm
MDC IDC MSMT LEADCHNL RA SENSING INTR AMPL: 4 mV
MDC IDC SESS DTM: 20150112160004
MDC IDC SET LEADCHNL RV PACING PULSEWIDTH: 0.4 ms

## 2013-05-07 NOTE — Assessment & Plan Note (Signed)
Stable post ablation 80 % apacing

## 2013-05-07 NOTE — Assessment & Plan Note (Signed)
The patient's device was interrogated.  The information was reviewed. No changes were made in the programming.    

## 2013-05-07 NOTE — Assessment & Plan Note (Signed)
Paroxysms of afib  max duration 1 min

## 2013-05-07 NOTE — Progress Notes (Signed)
Patient Care Team: Cassandria Anger, MD as PCP - General Kathlee Nations, MD (Psychiatry) Mayme Genta, MD (Gastroenterology) Fay Records, MD (Cardiology) Pedro Earls, MD (General Surgery) Hessie Dibble, MD as Consulting Physician (Orthopedic Surgery)   HPI  [name] is a 80 y.o. female seen in followup for orthostatic intolerance, exercise intolerance, and prior pacemaker implantation for sinus node dysfunction.   She complains of dyspnea on exertion Stress echo 2012 normal LV function. She's had no edema. She has no dietary indiscretion. She has no orthopnea or nocturnal dyspnea.   She is doing pretty well   She has been working with her kids on working together in [employer]       Past Medical History      Past Medical History  Diagnosis Date  . Thyroid disease   . Glaucoma   . IBS (irritable bowel syndrome)   . Incontinence   . GERD (gastroesophageal reflux disease)   . Hypertension   . Anxiety   . Depression     Dr Cheryln Manly  . HX: breast cancer   . Osteoarthritis   . Osteopenia   . Bradycardia   . IBS (irritable bowel syndrome)   . Discoid lupus     skin  . Pacemaker     Brady//Chronotropic incompetence with normal pacemaker function  . Full dentures   . Wears hearing aid     both ears    Past Surgical History  Procedure Laterality Date  . Pacemaker placement  2009  . Foot surgery  2004    Left-hammer toes  . Mastectomy  2002    bilat mastectomy-snbx  . Breast lumpectomy  1995    lt -axillary node dissectoin  . Dorsal compartment release  2000    left  . Abdominal hysterectomy    . Eye surgery  2011    cataract-lt  . Knee arthroscopy Left 08/29/2012    Procedure: LEFT KNEE ARTHROSCOPY ;  Surgeon: Hessie Dibble, MD;  Location: [facility];  Service: Orthopedics;  Laterality: Left;    Current Outpatient Prescriptions  Medication Sig Dispense Refill  . allopurinol (ZYLOPRIM) 300 MG tablet TAKE ONE  TABLET BY MOUTH EVERY DAY  30 tablet  3  . carboxymethylcellulose (REFRESH PLUS) 0.5 % SOLN 1 drop 3 (three) times daily as needed.      . Cetirizine HCl (ZYRTEC ALLERGY) 10 MG CAPS Take by mouth.        . EPIPEN 2-PAK 0.3 MG/0.3ML DEVI Inject 1 Device into the muscle daily as needed. For allergic reaction      . escitalopram (LEXAPRO) 10 MG tablet Take 1 tablet (10 mg total) by mouth daily.  90 tablet  0  . Garlic TABS Take by mouth daily.      [name] levothyroxine (SYNTHROID, LEVOTHROID) 100 MCG tablet TAKE ONE TABLET BY MOUTH EVERY DAY  90 tablet  1  . LORazepam (ATIVAN) 0.5 MG tablet TAKE ONE TABLET BY MOUTH ONCE DAILY AS NEEDED FOR ANXIETY  30 tablet  0  . Magnesium 200 MG TABS Take 400 mg by mouth daily.      [name] oxybutynin (DITROPAN) 5 MG tablet Take 5 mg by mouth 2 (two) times daily.      . pindolol (VISKEN) 5 MG tablet TAKE ONE TABLET BY MOUTH TWICE DAILY  180 tablet  1  . Potassium Gluconate 550 MG TABS Take 1 tablet by mouth daily.      [name]  Probiotic Product (PROBIOTIC DAILY PO) Take by mouth daily.      . RABEprazole (ACIPHEX) 20 MG tablet Take 20 mg by mouth 2 (two) times daily.       [name] spironolactone (ALDACTONE) 25 MG tablet TAKE ONE TABLET BY MOUTH EVERY DAY  30 tablet  5   No current facility-administered medications for this visit.    Allergies  Allergen Reactions  . Amlodipine Besy-Benazepril Hcl Swelling  . Amlodipine Besylate Swelling  . Clonidine Hydrochloride     REACTION: tired  . Lactose Intolerance (Gi) Other (See Comments)  . Valsartan   . Verapamil     Review of Systems negative except from HPI and PMH  Physical Exam BP 141/88  Pulse 67  Ht 5\' 2"  (1.575 m)  Wt 180 lb (81.647 kg)  BMI 32.91 kg/m2 Well developed and well nourished in no acute distress HENT normal E scleral and icterus clear Neck Supple JVP flat; carotids brisk and full Clear to ausculation  Regular rate and rhythm, no murmurs gallops or rub Soft with active bowel sounds No clubbing  cyanosis none Edema Alert and oriented, grossly normal motor and sensory function Skin Warm and Dry    Assessment and  Plan

## 2013-05-07 NOTE — Patient Instructions (Signed)

## 2013-05-10 ENCOUNTER — Encounter: Payer: Self-pay | Admitting: Internal Medicine

## 2013-05-14 ENCOUNTER — Ambulatory Visit (INDEPENDENT_AMBULATORY_CARE_PROVIDER_SITE_OTHER): Payer: Medicare Other | Admitting: Psychology

## 2013-05-14 DIAGNOSIS — F331 Major depressive disorder, recurrent, moderate: Secondary | ICD-10-CM | POA: Diagnosis not present

## 2013-05-15 ENCOUNTER — Ambulatory Visit: Payer: Medicare Other | Admitting: Psychology

## 2013-05-22 ENCOUNTER — Encounter: Payer: Self-pay | Admitting: Internal Medicine

## 2013-05-22 DIAGNOSIS — J309 Allergic rhinitis, unspecified: Secondary | ICD-10-CM | POA: Diagnosis not present

## 2013-05-23 NOTE — Telephone Encounter (Signed)
Per Dr. Caryl Comes pt scheduled to see Richardson Dopp, PAC 2/3 for possbile low dose diuretic trial. Pt agreeable to plan.

## 2013-05-28 ENCOUNTER — Ambulatory Visit (INDEPENDENT_AMBULATORY_CARE_PROVIDER_SITE_OTHER): Payer: Medicare Other | Admitting: Psychology

## 2013-05-28 DIAGNOSIS — F331 Major depressive disorder, recurrent, moderate: Secondary | ICD-10-CM | POA: Diagnosis not present

## 2013-05-29 ENCOUNTER — Telehealth: Payer: Self-pay | Admitting: *Deleted

## 2013-05-29 ENCOUNTER — Ambulatory Visit (INDEPENDENT_AMBULATORY_CARE_PROVIDER_SITE_OTHER): Payer: Medicare Other | Admitting: Physician Assistant

## 2013-05-29 ENCOUNTER — Encounter: Payer: Self-pay | Admitting: Physician Assistant

## 2013-05-29 VITALS — BP 128/63 | HR 70 | Ht 62.0 in | Wt 171.0 lb

## 2013-05-29 DIAGNOSIS — R0602 Shortness of breath: Secondary | ICD-10-CM

## 2013-05-29 DIAGNOSIS — I10 Essential (primary) hypertension: Secondary | ICD-10-CM

## 2013-05-29 DIAGNOSIS — J309 Allergic rhinitis, unspecified: Secondary | ICD-10-CM | POA: Diagnosis not present

## 2013-05-29 DIAGNOSIS — Z950 Presence of cardiac pacemaker: Secondary | ICD-10-CM

## 2013-05-29 LAB — CBC WITH DIFFERENTIAL/PLATELET
BASOS ABS: 0 10*3/uL (ref 0.0–0.1)
Basophils Relative: 0.5 % (ref 0.0–3.0)
EOS ABS: 0.1 10*3/uL (ref 0.0–0.7)
Eosinophils Relative: 1.4 % (ref 0.0–5.0)
HCT: 41.9 % (ref 36.0–46.0)
HEMOGLOBIN: 13.5 g/dL (ref 12.0–15.0)
LYMPHS PCT: 20.9 % (ref 12.0–46.0)
Lymphs Abs: 1 10*3/uL (ref 0.7–4.0)
MCHC: 32.3 g/dL (ref 30.0–36.0)
MCV: 101.5 fl — ABNORMAL HIGH (ref 78.0–100.0)
Monocytes Absolute: 0.3 10*3/uL (ref 0.1–1.0)
Monocytes Relative: 6.1 % (ref 3.0–12.0)
Neutro Abs: 3.4 10*3/uL (ref 1.4–7.7)
Neutrophils Relative %: 71.1 % (ref 43.0–77.0)
Platelets: 223 10*3/uL (ref 150.0–400.0)
RBC: 4.12 Mil/uL (ref 3.87–5.11)
RDW: 15 % — AB (ref 11.5–14.6)
WBC: 4.7 10*3/uL (ref 4.5–10.5)

## 2013-05-29 LAB — BASIC METABOLIC PANEL
BUN: 17 mg/dL (ref 6–23)
CALCIUM: 9.2 mg/dL (ref 8.4–10.5)
CO2: 30 mEq/L (ref 19–32)
Chloride: 102 mEq/L (ref 96–112)
Creatinine, Ser: 1 mg/dL (ref 0.4–1.2)
GFR: 66.83 mL/min (ref >60.00)
GLUCOSE: 82 mg/dL (ref 70–99)
Potassium: 3.9 mEq/L (ref 3.5–5.1)
SODIUM: 137 meq/L (ref 135–145)

## 2013-05-29 LAB — BRAIN NATRIURETIC PEPTIDE: Pro B Natriuretic peptide (BNP): 103 pg/mL — ABNORMAL HIGH (ref 0.0–100.0)

## 2013-05-29 MED ORDER — SPIRONOLACTONE 25 MG PO TABS: ORAL_TABLET | ORAL | Status: DC

## 2013-05-29 NOTE — Telephone Encounter (Signed)
pt notified about lab results and states she is only taking the spironolactone 25 mg every 3rd day per Dr. Alain Marion. I stated chart still said 25 mg daily, chart now reflects every 3rd day. I will d/w PA to see if we still need to start lasix 10 m,w,f. I advised pt that I was not in the office tomorrow 05/30/13 however; Brynda Rim. PA and he will let the nurse who is covering him tomorrow what we will do if still need lasix or not and we will call he back to let her know. Pt said thank you so much for my help.

## 2013-05-29 NOTE — Progress Notes (Signed)
[address], [place], [place]  [address] Phone: [phone] Fax:  [phone]  Date:  05/29/2013   ID:  [name], DOB January 31, 1930, MRN [id number]  PCP:  Walker Kehr, MD  Cardiologist:  Dr. Virl Axe     History of Present Illness: [name] is a 80 y.o. female with a history of orthostatic intolerance, exercise intolerance, sinus node dysfunction status post pacemaker implantation, HTN, breast CA. Last seen by Dr. Caryl Comes 04/2013.  Interrogation of pacemaker demonstrated less than 1% of atrial fibrillation.    Echocardiogram (03/2011): Mild LVH, EF 19-37%, grade 1 diastolic dysfunction, mild MR, mild to moderate TR, PASP 42.  ETT-echocardiogram (06/08/11): Sub-optimal exercise. Test stopped early due to dizziness and hypotension. EF 60%.  Non-diagnostic.  She was brought back in today to discuss dyspnea. Patient tells me that she has been short of breath since 2009 when her pacemaker was implanted. She denies any significant changes. She describes NYHA 2b-3 symptoms. She has been sleeping on 2 pillows chronically. She denies PND or edema. She denies any chest discomfort with exertion. She denies exertional arm or jaw discomfort. She has a lot of significant IBS symptoms. She feels as though she has abdominal discomfort at times that radiates up into her chest. She denies syncope. The chart indicates that she might benefit from a trial of diuretic therapy and was brought in to see me today.  Recent Labs: 01/01/2013: ALT 12; Creatinine 0.9; Hemoglobin 13.4; Potassium 4.2; TSH 1.51   Wt Readings from Last 3 Encounters:  05/29/13 171 lb (77.565 kg)  05/07/13 180 lb (81.647 kg)  04/03/13 177 lb (80.287 kg)     Past Medical History  Diagnosis Date  . Thyroid disease   . Glaucoma   . IBS (irritable bowel syndrome)   . Incontinence   . GERD (gastroesophageal reflux disease)   . Hypertension   . Anxiety   . Depression     Dr Cheryln Manly  . HX: breast cancer   .  Osteoarthritis   . Osteopenia   . Bradycardia   . IBS (irritable bowel syndrome)   . Discoid lupus     skin  . Pacemaker     Brady//Chronotropic incompetence with normal pacemaker function  . Full dentures   . Wears hearing aid     both ears    Current Outpatient Prescriptions  Medication Sig Dispense Refill  . allopurinol (ZYLOPRIM) 300 MG tablet TAKE ONE TABLET BY MOUTH EVERY DAY  30 tablet  3  . Cetirizine HCl (ZYRTEC ALLERGY) 10 MG CAPS Take by mouth.        . EPIPEN 2-PAK 0.3 MG/0.3ML DEVI Inject 1 Device into the muscle daily as needed. For allergic reaction      . escitalopram (LEXAPRO) 10 MG tablet Take 1 tablet (10 mg total) by mouth daily.  90 tablet  0  . Garlic TABS Take by mouth daily.      [name] levothyroxine (SYNTHROID, LEVOTHROID) 100 MCG tablet TAKE ONE TABLET BY MOUTH EVERY DAY  90 tablet  1  . LORazepam (ATIVAN) 0.5 MG tablet TAKE ONE TABLET BY MOUTH ONCE DAILY AS NEEDED FOR ANXIETY  30 tablet  0  . Magnesium 200 MG TABS Take 400 mg by mouth daily.      [name] oxybutynin (DITROPAN) 5 MG tablet Take 5 mg by mouth 2 (two) times daily.      . pindolol (VISKEN) 5 MG tablet TAKE ONE TABLET BY MOUTH TWICE DAILY  180  tablet  1  . Potassium Gluconate 550 MG TABS Take 1 tablet by mouth daily.      . Probiotic Product (PROBIOTIC DAILY PO) Take by mouth daily.      . RABEprazole (ACIPHEX) 20 MG tablet Take 20 mg by mouth 2 (two) times daily.       [name] spironolactone (ALDACTONE) 25 MG tablet TAKE ONE TABLET BY MOUTH EVERY DAY  30 tablet  5   No current facility-administered medications for this visit.    Allergies:   Amlodipine besy-benazepril hcl; Amlodipine besylate; Clonidine hydrochloride; Lactose intolerance (gi); Valsartan; and Verapamil   Social History:  The patient  reports that she has never smoked. She does not have any smokeless tobacco history on file. She reports that she does not drink alcohol or use illicit drugs.   Family History:  The patient's family history  includes Cancer in her brother and mother; Heart disease in her brother and father; Stroke in her brother and sister.   ROS:  Please see the history of present illness.   She denies fevers, chills, cough, vomiting, wheezing, melena, hematochezia.   All other systems reviewed and negative.   PHYSICAL EXAM: VS:  BP 128/63  Pulse 70  Ht 5\' 2"  (1.575 m)  Wt 171 lb (77.565 kg)  BMI 31.27 kg/m2 Well nourished, well developed, in no acute distress HEENT: normal Neck: no JVD Cardiac:  normal S1, S2; RRR; no murmur Lungs:  clear to auscultation bilaterally, no wheezing, rhonchi or rales Abd: soft, nontender, no hepatomegaly Ext: no edema Skin: warm and dry Neuro:  CNs 2-12 intact, no focal abnormalities noted  EKG:  A paced, HR 69, nonspecific ST-T wave changes (tracing done at 1/2 gain and is hard to compare to previous)     ASSESSMENT AND PLAN:  1. Dyspnea: This is a chronic symptom for the patient. She has not really noticed a significant difference over the years. She may be gradually more symptomatic. However, this is difficult to ascertain. She denies any significant chest discomfort. There was a question of whether or not she would benefit from a trial of low-dose diuretic therapy. The patient has been on spironolactone for some time. She did decrease this to 2-3 times a week recently due to LE cramping. She has not noticed a worsening in her dyspnea since this time. I will obtain an echocardiogram to reassess her LV function as well as her RV function. I will also get a basic metabolic panel, CBC and BNP. If her BNP is significantly elevated, I will place her on a low dose of furosemide to see if she has benefit.  This will be done with caution as she does have a history of orthostatic.  At this point, stress testing does not appear to be indicated. 2. Hypertension: Controlled. 3. Status Post Pacemaker: Continue follow up with EP as planned. 4. Disposition: Follow up with me in one month  to  Signed, Richardson Dopp, PA-C  05/29/2013 12:13 PM

## 2013-05-29 NOTE — Patient Instructions (Signed)
LAB WORK TODAY; BMET, CBC W/DIFF, BNP  Your physician has requested that you have an echocardiogram. Echocardiography is a painless test that uses sound waves to create images of your heart. It provides your doctor with information about the size and shape of your heart and how well your heart's chambers and valves are working. This procedure takes approximately one hour. There are no restrictions for this procedure.  Your physician recommends that you schedule a follow-up appointment in: [place], [place] THE OFFICE PER SCOTT WEAVER, [facility]  Your physician recommends that you continue on your current medications as directed. Please refer to the Current Medication list given to you today.

## 2013-05-30 ENCOUNTER — Ambulatory Visit (INDEPENDENT_AMBULATORY_CARE_PROVIDER_SITE_OTHER): Payer: Medicare Other | Admitting: Psychiatry

## 2013-05-30 VITALS — BP 144/104 | HR 64 | Ht 62.0 in | Wt 171.0 lb

## 2013-05-30 DIAGNOSIS — F329 Major depressive disorder, single episode, unspecified: Secondary | ICD-10-CM | POA: Diagnosis not present

## 2013-05-30 MED ORDER — SPIRONOLACTONE 25 MG PO TABS: ORAL_TABLET | ORAL | Status: DC

## 2013-05-30 MED ORDER — ESCITALOPRAM OXALATE 10 MG PO TABS: 10.0000 mg | ORAL_TABLET | Freq: Every day | ORAL | Status: DC

## 2013-05-30 MED ORDER — LORAZEPAM 0.5 MG PO TABS: ORAL_TABLET | ORAL | Status: DC

## 2013-05-30 NOTE — Addendum Note (Signed)
Addended by: Hetty Blend on: 05/30/2013 03:23 PM   Modules accepted: Orders

## 2013-05-30 NOTE — Progress Notes (Signed)
Called pt to advise that Richardson Dopp wanted her to take the Lasix on M,W,F.  She states she would like to take one medication either Lasix or Spironalactone.  States she doesn't weight herself every day.  Has some swelling in hand and feet but "I don't pay any attention to that".  Will speak w/Scott to see if ok to take one of the meds q day.  Margaret Pyle says she may take the Spironalactone every day.  Advised that she needs to weigh every day and to notify us if wt gain is more than 2-3 lbs in one day. Also advised that if becomes SOB or more swelling in hands and feet to call us.  Also will need to get a BMET on Monday to evaulate her kidney function. She verbalizes understanding and will call if has any questions.

## 2013-05-30 NOTE — Progress Notes (Signed)
[place] [phone] Progress Note  [name] [id number] 80 y.o.  05/30/2013 11:06 AM  Chief Complaint:  Medication management and followup.    History presenting illness [name] came for her followup appointment.  She is complaining of shortness of breath and weakness.  She recently seen a cardiologist and she scheduled to have an echocardiogram.  She feels anxious and depressed but does not feel that she needed more medication.  She is taking Ativan at bedtime.  There are times that she has difficulty sleeping but not she is feeling better since she moved the Ativan at bedtime.  She is compliant with Lexapro.  She saw gastroenterologist at [facility] for her IBS and she was recommended antibiotics however her insurance does not approve.  She admitted some time frustrated and disappointment but denies any suicidal thoughts or homicidal thoughts.  She is seeing Dr Cheryln Manly for counseling every other week.  She denies any crying spells, mood swing or any anger.  She admitted decreased energy and lack of motivation because of chronic physical health issues.  She promised that she would continue her Lexapro every day as she acknowledged that it does help with depression.  Patient does not have any tremors or shakes.  She's not drinking or using any illegal substance.  Suicidal Ideation: No Plan Formed: No Patient has means to carry out plan: No  Homicidal Ideation: No Plan Formed: No Patient has means to carry out plan: No  Review of Systems: Psychiatric: Agitation: No Hallucination: No Depressed Mood: Yes Insomnia: Yes Hypersomnia: No Altered Concentration: No Feels Worthless: No Grandiose Ideas: No Belief In Special Powers: No New/Increased Substance Abuse: No Compulsions: No  Neurologic: Headache: Yes Seizure: No Paresthesias: No Review of Systems  Musculoskeletal: Positive for joint pain.   Past psychiatric history Patient has one history of psychiatric inpatient  treatment many years ago when she was admitted due to suicidal thinking.  Patient denies any history of suicidal attempt or any psychosis.    Medical history Patient has history of hypothyroidism, vitamin D deficiency, hyperlipidemia, hypertension, allergic rhinitis, GERD, and a fissure, cystitis, chronic diarrhea, afibrillation, osteoporosis, chronic fatigue, history of breast cancer, hearing loss, irritable bladder, chronic knee pain and dysphagia.  Her primary care physician is Dr. Richardson Landry.  Outpatient Encounter Prescriptions as of 05/30/2013  Medication Sig  . escitalopram (LEXAPRO) 10 MG tablet Take 1 tablet (10 mg total) by mouth daily.  [name] LORazepam (ATIVAN) 0.5 MG tablet TAKE ONE TABLET BY MOUTH ONCE DAILY AS NEEDED FOR ANXIETY  . [DISCONTINUED] escitalopram (LEXAPRO) 10 MG tablet Take 1 tablet (10 mg total) by mouth daily.  . [DISCONTINUED] LORazepam (ATIVAN) 0.5 MG tablet TAKE ONE TABLET BY MOUTH ONCE DAILY AS NEEDED FOR ANXIETY  . allopurinol (ZYLOPRIM) 300 MG tablet TAKE ONE TABLET BY MOUTH EVERY DAY  . Cetirizine HCl (ZYRTEC ALLERGY) 10 MG CAPS Take by mouth.    . EPIPEN 2-PAK 0.3 MG/0.3ML DEVI Inject 1 Device into the muscle daily as needed. For allergic reaction  . Garlic TABS Take by mouth daily.  [name] levothyroxine (SYNTHROID, LEVOTHROID) 100 MCG tablet TAKE ONE TABLET BY MOUTH EVERY DAY  . Magnesium 200 MG TABS Take 400 mg by mouth daily.  [name] oxybutynin (DITROPAN) 5 MG tablet Take 5 mg by mouth 2 (two) times daily.  . pindolol (VISKEN) 5 MG tablet TAKE ONE TABLET BY MOUTH TWICE DAILY  . Potassium Gluconate 550 MG TABS Take 1 tablet by mouth daily.  . Probiotic Product (PROBIOTIC DAILY  PO) Take by mouth daily.  . RABEprazole (ACIPHEX) 20 MG tablet Take 20 mg by mouth 2 (two) times daily.   [name] spironolactone (ALDACTONE) 25 MG tablet Pt states per Dr. Alain Marion to take every 3rd day    Past Psychiatric History/Hospitalization(s): Anxiety: Yes Bipolar Disorder: No Depression:  Yes Mania: No Psychosis: No Schizophrenia: No Personality Disorder: No Hospitalization for psychiatric illness: Yes History of Electroconvulsive Shock Therapy: No Prior Suicide Attempts: No  Physical Exam: Constitutional:  BP 144/104  Pulse 64  Ht 5\' 2"  (1.575 m)  Wt 171 lb (77.565 kg)  BMI 31.27 kg/m2  Mental status examination Patient is casually dressed and fairly groomed.  She is pleasant and cooperative.  Her speech is clear.  She denies any active or passive suicidal thoughts or homicidal thoughts.  There were no paranoia or delusions present at this time.  She describes her mood as good and her affect is mood appropriate.  Her attention concentration is okay.  She is alert and oriented x3.  Her insight judgment and impulse control is okay.  Established Problem, Stable/Improving (1), Review of Last Therapy Session (1) and Review of Medication Regimen & Side Effects (2)  Assessment: Axis I: Maj. depressive disorder  Axis II: Deferred  Axis III: See medical history  Axis IV: Mild to moderate  Axis V: 60-65   Plan:  Reassurance given.  Recommended to see therapist for coping and social skills.  Patient is not interested to increase the dosage of Lexapro.  I will continue Lexapro 10 mg daily and Ativan 0.5 mg at bedtime.  Recommend to call us back if she has any question or concern.  I will see her again in 3 months.  ARFEEN,SYED T., MD 05/30/2013

## 2013-06-04 ENCOUNTER — Other Ambulatory Visit (INDEPENDENT_AMBULATORY_CARE_PROVIDER_SITE_OTHER): Payer: Medicare Other

## 2013-06-04 DIAGNOSIS — E039 Hypothyroidism, unspecified: Secondary | ICD-10-CM

## 2013-06-04 DIAGNOSIS — I10 Essential (primary) hypertension: Secondary | ICD-10-CM | POA: Diagnosis not present

## 2013-06-04 LAB — BASIC METABOLIC PANEL
BUN: 15 mg/dL (ref 6–23)
CHLORIDE: 103 meq/L (ref 96–112)
CO2: 25 meq/L (ref 19–32)
Calcium: 9.1 mg/dL (ref 8.4–10.5)
Creatinine, Ser: 1.1 mg/dL (ref 0.4–1.2)
GFR: 63.93 mL/min (ref >60.00)
Glucose, Bld: 92 mg/dL (ref 70–99)
Potassium: 4.2 mEq/L (ref 3.5–5.1)
Sodium: 136 mEq/L (ref 135–145)

## 2013-06-05 ENCOUNTER — Telehealth: Payer: Self-pay | Admitting: Internal Medicine

## 2013-06-05 NOTE — Telephone Encounter (Signed)
Have some questions about her medications .[name] Taking fluid pill seems as though body is still retaining fluids .[name] Please call

## 2013-06-06 ENCOUNTER — Ambulatory Visit (HOSPITAL_COMMUNITY)
Admission: RE | Admit: 2013-06-06 | Discharge: 2013-06-06 | Disposition: A | Discharge status: Home / Self Care | Payer: Medicare Other | Source: Ambulatory Visit | Attending: Physician Assistant | Admitting: Physician Assistant

## 2013-06-06 DIAGNOSIS — I10 Essential (primary) hypertension: Secondary | ICD-10-CM | POA: Insufficient documentation

## 2013-06-06 DIAGNOSIS — I059 Rheumatic mitral valve disease, unspecified: Secondary | ICD-10-CM | POA: Diagnosis not present

## 2013-06-06 DIAGNOSIS — R0989 Other specified symptoms and signs involving the circulatory and respiratory systems: Secondary | ICD-10-CM | POA: Insufficient documentation

## 2013-06-06 DIAGNOSIS — I369 Nonrheumatic tricuspid valve disorder, unspecified: Secondary | ICD-10-CM

## 2013-06-06 DIAGNOSIS — I079 Rheumatic tricuspid valve disease, unspecified: Secondary | ICD-10-CM | POA: Diagnosis not present

## 2013-06-06 DIAGNOSIS — J309 Allergic rhinitis, unspecified: Secondary | ICD-10-CM | POA: Diagnosis not present

## 2013-06-06 DIAGNOSIS — R0609 Other forms of dyspnea: Secondary | ICD-10-CM | POA: Insufficient documentation

## 2013-06-06 DIAGNOSIS — R0602 Shortness of breath: Secondary | ICD-10-CM

## 2013-06-06 DIAGNOSIS — I379 Nonrheumatic pulmonary valve disorder, unspecified: Secondary | ICD-10-CM | POA: Insufficient documentation

## 2013-06-06 NOTE — Telephone Encounter (Signed)
Pt reports taking Spironlactone every morning, she isn't urinating during the day, only at night. Wants to know what to do. She last saw Richardson Dopp, PA-C on 2/3 -- I will review with him and get back with her on recommendations She is agreeable to plan.

## 2013-06-06 NOTE — Progress Notes (Signed)
2D Echo Performed 06/06/2013    Teniola Tseng, RCS  

## 2013-06-06 NOTE — Telephone Encounter (Signed)
Pt advised to take at night to see if it makes a difference, per [employer], PA-C. I told her to call us back if there was no change/improvement. Patient verbalized understanding and agreeable to plan.

## 2013-06-07 ENCOUNTER — Encounter: Payer: Self-pay | Admitting: Physician Assistant

## 2013-06-08 ENCOUNTER — Encounter: Payer: Self-pay | Admitting: Physician Assistant

## 2013-06-08 ENCOUNTER — Telehealth: Payer: Self-pay | Admitting: *Deleted

## 2013-06-08 NOTE — Telephone Encounter (Signed)
pt notified about echo results normal EF, mild diastolic dysfunction. Pt states she just gets SOB making the bed, walking 2 flights of stairs. I asked if d/w PCP,  may need Pulm referral. pt said thank you. Pt anxious.

## 2013-06-11 ENCOUNTER — Other Ambulatory Visit: Payer: Self-pay

## 2013-06-11 ENCOUNTER — Encounter: Payer: Self-pay | Admitting: Internal Medicine

## 2013-06-11 ENCOUNTER — Ambulatory Visit: Payer: Medicare Other | Admitting: Psychology

## 2013-06-11 MED ORDER — ALLOPURINOL 300 MG PO TABS: ORAL_TABLET | ORAL | Status: DC

## 2013-06-12 ENCOUNTER — Encounter: Payer: Self-pay | Admitting: Internal Medicine

## 2013-06-15 ENCOUNTER — Institutional Professional Consult (permissible substitution): Payer: Self-pay | Admitting: Internal Medicine

## 2013-06-19 ENCOUNTER — Institutional Professional Consult (permissible substitution): Payer: Self-pay | Admitting: Internal Medicine

## 2013-06-20 ENCOUNTER — Encounter: Payer: Self-pay | Admitting: Internal Medicine

## 2013-06-20 ENCOUNTER — Ambulatory Visit (INDEPENDENT_AMBULATORY_CARE_PROVIDER_SITE_OTHER): Payer: Medicare Other | Admitting: Internal Medicine

## 2013-06-20 VITALS — BP 132/80 | HR 63 | Temp 97.7°F | Ht 64.0 in | Wt 178.6 lb

## 2013-06-20 DIAGNOSIS — I10 Essential (primary) hypertension: Secondary | ICD-10-CM

## 2013-06-20 DIAGNOSIS — J309 Allergic rhinitis, unspecified: Secondary | ICD-10-CM | POA: Diagnosis not present

## 2013-06-20 DIAGNOSIS — R0989 Other specified symptoms and signs involving the circulatory and respiratory systems: Secondary | ICD-10-CM

## 2013-06-20 DIAGNOSIS — R0600 Dyspnea, unspecified: Secondary | ICD-10-CM

## 2013-06-20 DIAGNOSIS — R0609 Other forms of dyspnea: Secondary | ICD-10-CM | POA: Diagnosis not present

## 2013-06-20 MED ORDER — NEBIVOLOL HCL 5 MG PO TABS: ORAL_TABLET | ORAL | Status: DC

## 2013-06-20 NOTE — Progress Notes (Signed)
   Subjective:    Patient ID: [name], female    DOB: 1929-05-20  MRN: [id number]  HPI  55 yobf never smoker with h/o sneezing/itching/coughing  eval 1979 and placed on allergy shots ever since but nose runs year round  if don't take zyrtec developed new doe 2014 self referred to pulmonary clinic 06/20/2013    06/20/2013 1st [facility] office visit/ [place] [place] cc progressive doe x one year indolent onset to point where can't do steps at condo, can't finish silver sneakers, can't walk a mile, can do grocery shopping but has to rest before going across the parking lot.  Hb during this time worse than usual on aciphex ac and pc  No obvious other patterns in day to day or daytime variabilty or assoc chronic cough or cp or chest tightness, subjective wheeze overt sinus or hb symptoms. No unusual exp hx or h/o childhood pna/ asthma or knowledge of premature birth.  Sleeping ok without nocturnal  or early am exacerbation  of respiratory  c/o's or need for noct saba. Also denies any obvious fluctuation of symptoms with weather or environmental changes or other aggravating or alleviating factors except as outlined above   Current Medications, Allergies, Complete Past Medical History, Past Surgical History, Family History, and Social History were reviewed in [employer] record.            Review of Systems  Constitutional: Negative for fever, chills and unexpected weight change.  HENT: Negative for congestion, dental problem, ear pain, nosebleeds, postnasal drip, rhinorrhea, sinus pressure, sneezing, sore throat, trouble swallowing and voice change.   Eyes: Negative for visual disturbance.  Respiratory: Positive for shortness of breath. Negative for cough and choking.   Cardiovascular: Negative for chest pain and leg swelling.  Gastrointestinal: Negative for vomiting, abdominal pain and diarrhea.  Genitourinary: Negative for difficulty urinating.       Acid  heartburn Indigestion  Musculoskeletal: Negative for arthralgias.  Skin: Negative for rash.  Neurological: Negative for tremors, syncope and headaches.  Hematological: Does not bruise/bleed easily.       Objective:   Physical Exam  Wt Readings from Last 3 Encounters:  06/20/13 178 lb 9.6 oz (81.012 kg)  05/30/13 171 lb (77.565 kg)  05/29/13 171 lb (77.565 kg)      HEENT: nl dentition, turbinates, and orophanx. Nl external ear canals without cough reflex   NECK :  without JVD/Nodes/TM/ nl carotid upstrokes bilaterally   LUNGS: no acc muscle use, clear to A and P bilaterally without cough on insp or exp maneuvers   CV:  RRR  no s3 or murmur or increase in P2, no edema   ABD:  soft and nontender with nl excursion in the supine position. No bruits or organomegaly, bowel sounds nl  MS:  warm without deformities, calf tenderness, cyanosis or clubbing  SKIN: warm and dry without lesions    NEURO:  alert, approp, no deficits     cxr 05/28/12 No acute cardiopulmonary disease or interval change.       Assessment & Plan:

## 2013-06-20 NOTE — Patient Instructions (Addendum)
Stop visken and start bystolic 5 mg one twice daily   Continue aciphex 20 mg Take 30- 60 min before your first and last meals of the day   Please schedule a follow up office visit in 4 weeks, sooner if needed with pft's on return

## 2013-06-21 ENCOUNTER — Institutional Professional Consult (permissible substitution): Payer: Self-pay | Admitting: Internal Medicine

## 2013-06-21 NOTE — Assessment & Plan Note (Addendum)
-   03/2011 CT angio was nl  - ECHO done on  06/06/13  -  06/20/2013  [facility] RA  2 laps @ 185 ft each stopped due to  Sob no desat  Symptoms are markedly disproportionate to objective findings and not clear this is a lung problem but pt does appear to have difficult airway management issues. DDX of  difficult airways managment all start with A and  include Adherence, Ace Inhibitors, Acid Reflux, Active Sinus Disease, Alpha 1 Antitripsin deficiency, Anxiety masquerading as Airways dz,  ABPA,  allergy(esp in young), Aspiration (esp in elderly), Adverse effects of DPI,  Active smokers, plus two Bs  = Bronchiectasis and Beta blocker use..and one C= CHF  Adherence is always the initial "prime suspect" and is a multilayered concern that requires a "trust but verify" approach in every patient - starting with knowing how to use medications, especially inhalers, correctly, keeping up with refills and understanding the fundamental difference between maintenance and prns vs those medications only taken for a very short course and then stopped and not refilled.   ? Beta blocker effect > see hbp  ? Allergy/ asthma > all the more reason to use as specific a beta blocker as possible  ? Acid (or non-acid) GERD > always difficult to exclude as up to 75% of pts in some series report no assoc GI/ Heartburn symptoms> rec max (24h)  acid suppression and diet restrictions/ reviewed and instructions given in writing.   Needs pfts then cpst to sort out

## 2013-06-21 NOTE — Assessment & Plan Note (Signed)
Strongly prefer in this setting: Bystolic, the most beta -1  selective Beta blocker available in sample form, with bisoprolol the most selective generic choice  on the market.   rx bisoprolol to 5 mg bid

## 2013-06-25 ENCOUNTER — Ambulatory Visit (INDEPENDENT_AMBULATORY_CARE_PROVIDER_SITE_OTHER): Payer: Medicare Other | Admitting: Psychology

## 2013-06-25 DIAGNOSIS — M6740 Ganglion, unspecified site: Secondary | ICD-10-CM | POA: Diagnosis not present

## 2013-06-25 DIAGNOSIS — F331 Major depressive disorder, recurrent, moderate: Secondary | ICD-10-CM | POA: Diagnosis not present

## 2013-06-25 DIAGNOSIS — M6530 Trigger finger, unspecified finger: Secondary | ICD-10-CM | POA: Diagnosis not present

## 2013-06-26 ENCOUNTER — Encounter: Payer: Self-pay | Admitting: Internal Medicine

## 2013-06-27 ENCOUNTER — Telehealth: Payer: Self-pay | Admitting: Internal Medicine

## 2013-06-27 ENCOUNTER — Encounter: Payer: Self-pay | Admitting: Internal Medicine

## 2013-06-27 NOTE — Telephone Encounter (Signed)
No that looks good

## 2013-06-27 NOTE — Telephone Encounter (Signed)
Called and spoke with pt. She reports this AM at 9:30 her BP was 138/90. She took her bystolic 5 mg. She reports she feels okay now. Feels better today than yesterday. She retook her BP just now and is 137/78 and pulse 64. Any further recs MW? thanks

## 2013-06-27 NOTE — Telephone Encounter (Signed)
Pt is aware. Nothing further needed 

## 2013-06-28 ENCOUNTER — Encounter: Payer: Self-pay | Admitting: Physician Assistant

## 2013-06-28 ENCOUNTER — Ambulatory Visit (INDEPENDENT_AMBULATORY_CARE_PROVIDER_SITE_OTHER): Payer: Medicare Other | Admitting: Physician Assistant

## 2013-06-28 VITALS — BP 128/82 | HR 62 | Ht 62.0 in | Wt 177.1 lb

## 2013-06-28 DIAGNOSIS — I10 Essential (primary) hypertension: Secondary | ICD-10-CM | POA: Diagnosis not present

## 2013-06-28 DIAGNOSIS — Z950 Presence of cardiac pacemaker: Secondary | ICD-10-CM

## 2013-06-28 DIAGNOSIS — R0602 Shortness of breath: Secondary | ICD-10-CM | POA: Diagnosis not present

## 2013-06-28 DIAGNOSIS — R079 Chest pain, unspecified: Secondary | ICD-10-CM | POA: Diagnosis not present

## 2013-06-28 DIAGNOSIS — J309 Allergic rhinitis, unspecified: Secondary | ICD-10-CM | POA: Diagnosis not present

## 2013-06-28 LAB — BASIC METABOLIC PANEL
BUN: 19 mg/dL (ref 6–23)
CO2: 28 meq/L (ref 19–32)
Calcium: 9.4 mg/dL (ref 8.4–10.5)
Chloride: 100 mEq/L (ref 96–112)
Creatinine, Ser: 1.1 mg/dL (ref 0.4–1.2)
GFR: 60.61 mL/min (ref >60.00)
GLUCOSE: 87 mg/dL (ref 70–99)
POTASSIUM: 4.3 meq/L (ref 3.5–5.1)
SODIUM: 135 meq/L (ref 135–145)

## 2013-06-28 NOTE — Patient Instructions (Signed)
NO CHANGES WERE MADE TODAY WITH YOUR MEDICATIONS  Your physician recommends that you return for lab work in: [place] has requested that you have a lexiscan myoview. For further information please visit [email or web address]. Please follow instruction sheet, as given.  Your physician recommends that you schedule a follow-up appointment in: [place], [place] OFFICE

## 2013-06-28 NOTE — Progress Notes (Signed)
[address] [place], [place]  [address] Phone: [phone] Fax:  [phone]  Date:  06/28/2013   ID:  [name], DOB [birth year], MRN [id number]  PCP:  Walker Kehr, MD  Cardiologist:  Dr. Virl Axe     History of Present Illness: [name] is a 80 y.o. female with a history of orthostatic intolerance, exercise intolerance, sinus node dysfunction status post pacemaker implantation, HTN, breast CA. Echo in 03/2011 demonstrated normal LVF and mild diastolic dysfunction.  ETT-Echo in 2/13 was non-dx due to sub-optimal exercise.  Last seen by Dr. Caryl Comes 04/2013.  Interrogation of pacemaker demonstrated less than 1% of atrial fibrillation.    I saw her 05/29/13 for evaluation of dyspnea.  Echocardiogram demonstrated normal LV function and mild diastolic dysfunction.  Her BNP was mildly elevated. I suggested a trial of low-dose Lasix 3 days a week. She opted to increase her spironolactone to every day.  She was seen in f/u by pulmonary (Dr. Melvyn Novas).  She had no desat with ambulation (after eliciting symptoms).   She was changed from pindolol to Bystolic.  She will get f/u PFTs in the future.  He is considering doing a CPST.  Patient returns for f/u here.  In retrospect, she feels her dyspnea has worsened over the last 6 mos.  She notes occasional chest fullness.  She feels this after exertion at times when her work of breathing is increased.  She has some diaphoresis assoc but no assoc radiating pain, nausea.   She sometimes notes CP with exertion.  She denies orthopnea, PND, edema.  She denies syncope.   Recent Labs: 01/01/2013: ALT 12; TSH 1.51  05/29/2013: Hemoglobin 13.5; Pro B Natriuretic peptide (BNP) 103.0*  06/04/2013: Creatinine 1.1; Potassium 4.2   Wt Readings from Last 3 Encounters:  06/28/13 177 lb 1.9 oz (80.341 kg)  06/20/13 178 lb 9.6 oz (81.012 kg)  05/30/13 171 lb (77.565 kg)     Past Medical History  Diagnosis Date  . Thyroid disease   . Glaucoma   . IBS  (irritable bowel syndrome)   . Incontinence   . GERD (gastroesophageal reflux disease)   . Hypertension   . Anxiety   . Depression     Dr Cheryln Manly  . HX: breast cancer   . Osteoarthritis   . Osteopenia   . Bradycardia   . IBS (irritable bowel syndrome)   . Discoid lupus     skin  . Pacemaker     Brady//Chronotropic incompetence with normal pacemaker function  . Full dentures   . Wears hearing aid     both ears  . Hx of echocardiogram     Echo (05/2013):  Mod LVH, EF 65-70%, no WMA, Gr 1 DD, mild MR, mod TR, PASP 39    Current Outpatient Prescriptions  Medication Sig Dispense Refill  . allopurinol (ZYLOPRIM) 300 MG tablet TAKE ONE TABLET BY MOUTH EVERY DAY as needed      . Cetirizine HCl (ZYRTEC ALLERGY) 10 MG CAPS Take 1 capsule by mouth daily as needed.       [name] EPIPEN 2-PAK 0.3 MG/0.3ML DEVI Inject 1 Device into the muscle daily as needed. For allergic reaction      . escitalopram (LEXAPRO) 10 MG tablet Take 1 tablet (10 mg total) by mouth daily.  90 tablet  0  . Garlic TABS Take by mouth daily.      [name] levothyroxine (SYNTHROID, LEVOTHROID) 100 MCG tablet TAKE ONE TABLET BY MOUTH EVERY  DAY  90 tablet  1  . LORazepam (ATIVAN) 0.5 MG tablet TAKE ONE TABLET BY MOUTH ONCE DAILY AS NEEDED FOR ANXIETY  30 tablet  2  . Magnesium 200 MG TABS Take 400 mg by mouth daily.      . nebivolol (BYSTOLIC) 5 MG tablet One twice daily    0  . oxybutynin (DITROPAN) 5 MG tablet Take 5 mg by mouth 2 (two) times daily as needed.       . Potassium Gluconate 550 MG TABS Take 1 tablet by mouth daily.      . Probiotic Product (PROBIOTIC DAILY PO) Take by mouth daily.      . RABEprazole (ACIPHEX) 20 MG tablet Take 20 mg by mouth 2 (two) times daily.       [name] spironolactone (ALDACTONE) 25 MG tablet Take one tablet every day       No current facility-administered medications for this visit.    Allergies:   Amlodipine besy-benazepril hcl; Amlodipine besylate; Clonidine hydrochloride; Lactose intolerance  (gi); Valsartan; and Verapamil   Social History:  The patient  reports that she has never smoked. She has never used smokeless tobacco. She reports that she does not drink alcohol or use illicit drugs.   Family History:  The patient's family history includes Cancer in her brother; Heart disease in her brother and father; Ovarian cancer in her mother; Stroke in her brother and sister.   ROS:  Please see the history of present illness.   She has a mild cough. No wheezing. No bleeding.  All other systems reviewed and negative.   PHYSICAL EXAM: VS:  BP 128/82  Pulse 62  Ht 5\' 2"  (1.575 m)  Wt 177 lb 1.9 oz (80.341 kg)  BMI 32.39 kg/m2 Well nourished, well developed, in no acute distress HEENT: normal Neck: no JVD Cardiac:  normal S1, S2; RRR; no murmur Lungs:  clear to auscultation bilaterally, no wheezing, rhonchi or rales Abd: soft, nontender, no hepatomegaly Ext: no edema Skin: warm and dry Neuro:  CNs 2-12 intact, no focal abnormalities noted  EKG:  A paced, HR 62, LAD, NSSTTW changes     ASSESSMENT AND PLAN:  1. Dyspnea:  Etiology not entirely clear.  She has a chronic component of dyspnea. She feels it has worsened over the last 6+ months.  She has workup pending with pulmonology.  PFTs will be done at her next visit and Dr. Melvyn Novas is considering CPST.  This will likely be helpful.  The patient has diastolic dysfunction on her echo. She is on daily diuretics with possibly marginal benefit.  She also tells me that she has chest fullness at times.  She has never had an adequate ischemic evaluation.  She had a submaximal exercise stress echo in the past.  I will set her up for an [employer].   2. Hypertension:  Controlled.  Repeat BMET today with Rx with Spironolactone.  3. Status Post Pacemaker: Continue follow up with EP as planned. 4. Disposition: Follow up with me in 2 mos.  Signed, Richardson Dopp, PA-C  06/28/2013 12:24 PM

## 2013-07-02 ENCOUNTER — Ambulatory Visit: Payer: Self-pay | Admitting: Internal Medicine

## 2013-07-02 ENCOUNTER — Encounter: Payer: Self-pay | Admitting: Internal Medicine

## 2013-07-02 ENCOUNTER — Ambulatory Visit (HOSPITAL_COMMUNITY)
Admission: RE | Admit: 2013-07-02 | Discharge: 2013-07-02 | Disposition: A | Discharge status: Home / Self Care | Payer: Medicare Other | Source: Ambulatory Visit | Attending: Internal Medicine | Admitting: Internal Medicine

## 2013-07-02 ENCOUNTER — Ambulatory Visit (INDEPENDENT_AMBULATORY_CARE_PROVIDER_SITE_OTHER): Payer: Medicare Other | Admitting: Internal Medicine

## 2013-07-02 ENCOUNTER — Ambulatory Visit (INDEPENDENT_AMBULATORY_CARE_PROVIDER_SITE_OTHER)
Admission: RE | Admit: 2013-07-02 | Discharge: 2013-07-02 | Disposition: A | Discharge status: Home / Self Care | Payer: Medicare Other | Source: Ambulatory Visit | Attending: Internal Medicine | Admitting: Internal Medicine

## 2013-07-02 VITALS — BP 120/86 | HR 73 | Temp 97.6°F | Ht 62.0 in | Wt 171.0 lb

## 2013-07-02 DIAGNOSIS — I10 Essential (primary) hypertension: Secondary | ICD-10-CM | POA: Diagnosis not present

## 2013-07-02 DIAGNOSIS — R0989 Other specified symptoms and signs involving the circulatory and respiratory systems: Secondary | ICD-10-CM | POA: Diagnosis not present

## 2013-07-02 DIAGNOSIS — R0609 Other forms of dyspnea: Secondary | ICD-10-CM

## 2013-07-02 DIAGNOSIS — R0600 Dyspnea, unspecified: Secondary | ICD-10-CM

## 2013-07-02 DIAGNOSIS — R0602 Shortness of breath: Secondary | ICD-10-CM | POA: Diagnosis not present

## 2013-07-02 LAB — PULMONARY FUNCTION TEST
DL/VA % pred: 97 %
DL/VA: 4.43 ml/min/mmHg/L
DLCO UNC: 13.28 ml/min/mmHg
DLCO unc % pred: 61 %
FEF 25-75 Post: 1.68 L/sec
FEF 25-75 Pre: 0.9 L/sec
FEF2575-%Change-Post: 87 %
FEF2575-%Pred-Post: 150 %
FEF2575-%Pred-Pre: 80 %
FEV1-%CHANGE-POST: 14 %
FEV1-%Pred-Post: 109 %
FEV1-%Pred-Pre: 95 %
FEV1-Post: 1.48 L
FEV1-Pre: 1.29 L
FEV1FVC-%Change-Post: 13 %
FEV1FVC-%Pred-Pre: 98 %
FEV6-%Change-Post: 1 %
FEV6-%PRED-PRE: 105 %
FEV6-%Pred-Post: 107 %
FEV6-POST: 1.78 L
FEV6-Pre: 1.76 L
FEV6FVC-%PRED-PRE: 105 %
FEV6FVC-%Pred-Post: 105 %
FVC-%Change-Post: 1 %
FVC-%PRED-PRE: 100 %
FVC-%Pred-Post: 101 %
FVC-POST: 1.78 L
FVC-Pre: 1.76 L
POST FEV6/FVC RATIO: 100 %
Post FEV1/FVC ratio: 83 %
Pre FEV1/FVC ratio: 74 %
Pre FEV6/FVC Ratio: 100 %
RV % pred: 120 %
RV: 2.78 L
TLC % PRED: 93 %
TLC: 4.45 L

## 2013-07-02 MED ORDER — ALBUTEROL SULFATE (2.5 MG/3ML) 0.083% IN NEBU
2.5000 mg | INHALATION_SOLUTION | Freq: Once | RESPIRATORY_TRACT | Status: AC
  Administered 2013-07-02: 2.5 mg via RESPIRATORY_TRACT

## 2013-07-02 MED ORDER — BISOPROLOL FUMARATE 5 MG PO TABS: 5.0000 mg | ORAL_TABLET | Freq: Every day | ORAL | Status: DC

## 2013-07-02 MED ORDER — NEBIVOLOL HCL 5 MG PO TABS: 5.0000 mg | ORAL_TABLET | Freq: Every day | ORAL | Status: DC

## 2013-07-02 NOTE — Progress Notes (Signed)
Quick Note:  Spoke with pt and notified of results per Dr. Wert. Pt verbalized understanding and denied any questions.  ______ 

## 2013-07-02 NOTE — Progress Notes (Signed)
Subjective:    Patient ID: [name], female    DOB: [birth year]  MRN: [id number]   Brief patient profile:  68 yobf never smoker with h/o sneezing/itching/coughing  eval 1979 and placed on allergy shots ever since but nose runs year round  if doesn't  take zyrtec developed new doe 2014 self referred to pulmonary clinic 06/20/2013   History of Present Illness  06/20/2013 1st [facility] office visit/ [name] cc progressive doe x one year indolent onset to point where can't do steps at [place], can't finish silver sneakers, can't walk a mile, can do grocery shopping but has to rest before going across the parking lot.  Hb during this time worse than usual on aciphex ac and pc rec Stop visken and start bystolic 5 mg one twice daily  Continue aciphex 20 mg Take 30- 60 min before your first and last meals of the day    07/02/2013 f/u ov/[name] re: unexplained sob Chief Complaint  Patient presents with  . Follow-up    Pt reports her breathing is unchanged since her last visit. She c/o HA since she started taking bystolic.     ha is random, transient, not related to time of day or activity, generalized, s nausea of neuro/viz cc's  DOE s   obvious day to day or daytime variabilty or assoc chronic cough or cp or chest tightness, subjective wheeze overt sinus or hb symptoms. No unusual exp hx or h/o childhood pna/ asthma or knowledge of premature birth.  Sleeping ok without nocturnal  or early am exacerbation  of respiratory  c/o's or need for noct saba. Also denies any obvious fluctuation of symptoms with weather or environmental changes or other aggravating or alleviating factors except as outlined above   Current Medications, Allergies, Complete Past Medical History, Past Surgical History, Family History, and Social History were reviewed in [employer] record.  ROS  The following are not active complaints unless bolded sore throat, dysphagia, dental problems, itching,  sneezing,  nasal congestion or excess/ purulent secretions, ear ache,   fever, chills, sweats, unintended wt loss, pleuritic or exertional cp, hemoptysis,  orthopnea pnd or leg swelling, presyncope, palpitations, heartburn, abdominal pain, anorexia, nausea, vomiting, diarrhea  or change in bowel or urinary habits, change in stools or urine, dysuria,hematuria,  rash, arthralgias, visual complaints, headache, numbness weakness or ataxia or problems with walking or coordination,  change in mood/affect or memory.          Objective:   Physical Exam  07/02/2013        171  Wt Readings from Last 3 Encounters:  06/20/13 178 lb 9.6 oz (81.012 kg)  05/30/13 171 lb (77.565 kg)  05/29/13 171 lb (77.565 kg)      HEENT: nl dentition, turbinates, and orophanx. Nl external ear canals without cough reflex   NECK :  without JVD/Nodes/TM/ nl carotid upstrokes bilaterally   LUNGS: no acc muscle use, clear to A and P bilaterally without cough on insp or exp maneuvers   CV:  RRR  no s3 or murmur or increase in P2, no edema   ABD:  soft and nontender with nl excursion in the supine position. No bruits or organomegaly, bowel sounds nl  MS:  warm without deformities, calf tenderness, cyanosis or clubbing  SKIN: warm and dry without lesions    NEURO:  alert, approp, no de    CXR  07/02/2013 : 1. Postoperative changes. 2. Mild cardiomegaly. No edema. 3. No focal  acute pulmonary abnormality.        Assessment & Plan:

## 2013-07-02 NOTE — Patient Instructions (Addendum)
Please remember to go to the x-ray department downstairs for your tests - we will call you with the results when they are available.  Finish your cardiac evaluation first, then if not doing better call [phone] for CPST with spirometry before and after

## 2013-07-03 ENCOUNTER — Encounter: Payer: Self-pay | Admitting: Physician Assistant

## 2013-07-03 ENCOUNTER — Telehealth: Payer: Self-pay | Admitting: Internal Medicine

## 2013-07-03 NOTE — Telephone Encounter (Signed)
New message    Patient calling need to discuss procedure for next week.

## 2013-07-03 NOTE — Telephone Encounter (Signed)
Pt very SOB with activity at home, but states she is currently doing things around the house. She explains her PFTs yesterday were normal.  She states that she keeps taking test after test trying to find cause of SOB, and so far nothing. She is asking if she should still have lexi scan. I encouraged her to continue with this test and discussed reasonings/importance. Pt agreeable to keep lexi appointment next week.

## 2013-07-04 ENCOUNTER — Telehealth: Payer: Self-pay | Admitting: Internal Medicine

## 2013-07-04 ENCOUNTER — Other Ambulatory Visit: Payer: Self-pay | Admitting: Internal Medicine

## 2013-07-04 NOTE — Telephone Encounter (Signed)
Spoke with patient- she is aware to take Rx once daily.

## 2013-07-04 NOTE — Assessment & Plan Note (Signed)
-   03/2011 CT angio was nl  - ECHO done on  06/06/13  With bnp 103 (05/29/13) - Left ventricle: The cavity size was normal. There was moderate concentric hypertrophy. Systolic function was vigorous. The estimated ejection fraction was in the range of 65% to 70%. Wall motion was normal; there were no regional wall motion abnormalities. Doppler parameters are consistent with abnormal left ventricular relaxation (grade 1 diastolic dysfunction). There was no evidence of elevated ventricular filling pressure by Doppler parameters. - Aortic valve: Trileaflet; normal thickness leaflets. Transvalvular velocity was within the normal range. There was no stenosis. No regurgitation. - Mitral valve: Structurally normal valve. Mild regurgitation. Valve area by pressure half-time: 1.96cm^2. - Left atrium: The atrium was normal in size. - Right ventricle: Systolic function was normal. - Right atrium: Pacer wire or catheter noted in right atrium. - Tricuspid valve: Moderate regurgitation. - Pulmonic valve: Trivial regurgitation. - Pulmonary arteries: Systolic pressure was mildly increased. PA peak pressure: 51mm Hg -  06/20/2013  Walked RA  2 laps @ 185 ft each stopped due to  Sob no desat -  07/02/13 PFT's nl with dlco that corrects to nl    No clear etiology for her chronic doe as not explained by pfts or echo so next step once cardiology eval complete is cpst with spirometry before and after.

## 2013-07-04 NOTE — Assessment & Plan Note (Signed)
Changed visken to bisoprolol 06/20/13  Not clear at this point it helped changing her to a more specific BB but until we sort out her doe Strongly prefer in this setting: Bystolic, the most beta -1  selective Beta blocker available in sample form, with bisoprolol the most selective generic choice  on the market.

## 2013-07-09 ENCOUNTER — Ambulatory Visit (INDEPENDENT_AMBULATORY_CARE_PROVIDER_SITE_OTHER): Payer: Medicare Other | Admitting: Psychology

## 2013-07-09 DIAGNOSIS — M25569 Pain in unspecified knee: Secondary | ICD-10-CM | POA: Diagnosis not present

## 2013-07-09 DIAGNOSIS — F331 Major depressive disorder, recurrent, moderate: Secondary | ICD-10-CM

## 2013-07-09 DIAGNOSIS — M25549 Pain in joints of unspecified hand: Secondary | ICD-10-CM | POA: Diagnosis not present

## 2013-07-11 ENCOUNTER — Ambulatory Visit (HOSPITAL_COMMUNITY): Payer: Medicare Other | Attending: Physician Assistant | Admitting: Radiology

## 2013-07-11 VITALS — BP 151/92 | HR 62 | Ht 62.0 in | Wt 173.0 lb

## 2013-07-11 DIAGNOSIS — I4891 Unspecified atrial fibrillation: Secondary | ICD-10-CM | POA: Insufficient documentation

## 2013-07-11 DIAGNOSIS — R0609 Other forms of dyspnea: Secondary | ICD-10-CM | POA: Insufficient documentation

## 2013-07-11 DIAGNOSIS — R0602 Shortness of breath: Secondary | ICD-10-CM

## 2013-07-11 DIAGNOSIS — Z8249 Family history of ischemic heart disease and other diseases of the circulatory system: Secondary | ICD-10-CM | POA: Insufficient documentation

## 2013-07-11 DIAGNOSIS — I10 Essential (primary) hypertension: Secondary | ICD-10-CM | POA: Insufficient documentation

## 2013-07-11 DIAGNOSIS — R079 Chest pain, unspecified: Secondary | ICD-10-CM

## 2013-07-11 DIAGNOSIS — R0989 Other specified symptoms and signs involving the circulatory and respiratory systems: Secondary | ICD-10-CM | POA: Insufficient documentation

## 2013-07-11 MED ORDER — TECHNETIUM TC 99M SESTAMIBI GENERIC - CARDIOLITE
33.0000 | Freq: Once | INTRAVENOUS | Status: AC | PRN
  Administered 2013-07-11: 33 via INTRAVENOUS

## 2013-07-11 MED ORDER — REGADENOSON 0.4 MG/5ML IV SOLN
0.4000 mg | Freq: Once | INTRAVENOUS | Status: AC
  Administered 2013-07-11: 0.4 mg via INTRAVENOUS

## 2013-07-11 MED ORDER — TECHNETIUM TC 99M SESTAMIBI GENERIC - CARDIOLITE
10.8000 | Freq: Once | INTRAVENOUS | Status: AC | PRN
  Administered 2013-07-11: 11 via INTRAVENOUS

## 2013-07-11 NOTE — Progress Notes (Signed)
[facility] [address] [place], [place] [address] [phone]    Cardiology Nuclear Med Study  [name] is a 80 y.o. female     MRN : [id number]     DOB: April 25, 1930  Procedure Date: 07/11/2013  Nuclear Med Background Indication for Stress Test:  Evaluation for Ischemia History:  No known CAD, PTVP, Afib, Echo 2015 EF 60-70%, MPI Cardiac Risk Factors: Strong Family History - CAD and Hypertension  Symptoms:  Chest Pain and DOE   Nuclear Pre-Procedure Caffeine/Decaff Intake:  None >12 hrs NPO After: 10:30pm   Lungs:  clear O2 Sat: 98% on room air. IV 0.9% NS with Angio Cath:  24g  IV Site: R Hand, tolerated well  IV Started by:  Irven Baltimore, RN  Chest Size (in):  40 Cup Size: A, Bilateral Mastectomies  Height: 5\' 2"  (1.575 m)  Weight:  173 lb (78.472 kg)  BMI:  Body mass index is 31.63 kg/(m^2). Tech Comments:  Held Geophysical data processor Med Study 1 or 2 day study: 1 day  Stress Test Type:  Lexiscan  Reading MD: N/A  Order Authorizing Provider:  Virl Axe, MD  Resting Radionuclide: Technetium 45m Sestamibi  Resting Radionuclide Dose: 11.0 mCi   Stress Radionuclide:  Technetium 73m Sestamibi  Stress Radionuclide Dose: 33.0 mCi           Stress Protocol Rest HR: 62 Stress HR: 64  Rest BP: 151/92 Stress BP: 107/56  Exercise Time (min): n/a METS: n/a           Dose of Adenosine (mg):  n/a Dose of Lexiscan: 0.4 mg  Dose of Atropine (mg): n/a Dose of Dobutamine: n/a mcg/kg/min (at max HR)  Stress Test Technologist: Glade Lloyd, BS-ES  Nuclear Technologist:  Charlton Amor, CNMT     Rest Procedure:  Myocardial perfusion imaging was performed at rest 45 minutes following the intravenous administration of Technetium 91m Sestamibi. Rest ECG: NSR - Normal EKG  Stress Procedure:  The patient received IV Lexiscan 0.4 mg over 15-seconds.  Technetium 50m Sestamibi injected at 30-seconds.  Quantitative spect images were obtained  after a 45 minute delay.  During the infusion of Lexiscan, the patient complained of slight stomach cramps. This resolved in recovery.  Stress ECG: There are scattered PVCs. and There are scattered PACs.  QPS Raw Data Images:  There is interference from nuclear activity from structures below the diaphragm. This does not affect the ability to read the study. Stress Images:  There is decreased uptake in the inferior wall. Rest Images:  There is decreased uptake in the inferior wall. Subtraction (SDS):  There is a fixed inferior defect that is most consistent with diaphragmatic attenuation. Transient Ischemic Dilatation (Normal <1.22):  0.92 Lung/Heart Ratio (Normal <0.45):  0.22  Quantitative Gated Spect Images QGS EDV:  85 ml QGS ESV:  33 ml  Impression Exercise Capacity:  Lexiscan with no exercise. BP Response:  Normal blood pressure response. Clinical Symptoms:  No significant symptoms noted. ECG Impression:  No significant ECG changes with Lexiscan. Comparison with Prior Nuclear Study: No images to compare  Overall Impression:  Low risk stress nuclear study with inferior bowel attenuation artifact..  LV Ejection Fraction: 61%.  LV Wall Motion:  Normal Wall Motion  Pixie Casino, MD, [facility] Board Certified in Nuclear Cardiology Attending Cardiologist [place]

## 2013-07-12 ENCOUNTER — Encounter: Payer: Self-pay | Admitting: Physician Assistant

## 2013-07-13 ENCOUNTER — Telehealth: Payer: Self-pay | Admitting: *Deleted

## 2013-07-13 NOTE — Telephone Encounter (Signed)
pt notified about myoview results with verbal understanding 

## 2013-07-16 ENCOUNTER — Encounter: Payer: Self-pay | Admitting: Internal Medicine

## 2013-07-20 ENCOUNTER — Telehealth: Payer: Self-pay | Admitting: Physician Assistant

## 2013-07-20 DIAGNOSIS — I4891 Unspecified atrial fibrillation: Secondary | ICD-10-CM

## 2013-07-20 DIAGNOSIS — I10 Essential (primary) hypertension: Secondary | ICD-10-CM

## 2013-07-20 NOTE — Telephone Encounter (Signed)
Pt called because  Since she  started taking Spironolactone 25 mg once  Day, she has been having cramps in her LE . Pt states she has been taking potassium Gluconate supplement 550 mg daily. She thinks that  maybe this is not enough for her. Pt  was taking Spironolactone 2 to 3 times a week before; prescribed by her PCP and she did not have any problems. Pt would like to know if she needs to take Potassium prescription.  Dr. Mare Ferrari is aware,pt's last  blood work: potassium and kidney function were normal. And on the last office visit pt's BP was 128/82. Dr Mare Ferrari MD would like for Richardson Dopp PA to make the recommendations. Pt states she will start taking the Spironolactone 25 mg every other day tomorrow  until her cramps get better and she hear from PA's recommendations.

## 2013-07-20 NOTE — Telephone Encounter (Signed)
New message    Pt is taking spironlactone daily prescribed by [employer].  Since she started taking it daily---she is experiencing cramps.  She is also taking an over-the-counter potassium.  Pt need advise on adjusting medication.

## 2013-07-23 ENCOUNTER — Ambulatory Visit: Payer: Self-pay | Admitting: Internal Medicine

## 2013-07-23 MED ORDER — SPIRONOLACTONE 25 MG PO TABS: ORAL_TABLET | ORAL | Status: DC

## 2013-07-23 NOTE — Telephone Encounter (Signed)
pt advised per Brynda Rim. PA to decrease spironolactone to 25 3 x weekly, and to NOT take a K+ supp. Pt verblaied understanding to Plan of Care.

## 2013-07-23 NOTE — Telephone Encounter (Signed)
She can change Spironolactone back to 3 x per week. She should NOT take K+ supplement. Repeat BMET in 1 week. Richardson Dopp, PA-C   07/23/2013 12:50 PM

## 2013-07-24 ENCOUNTER — Other Ambulatory Visit: Payer: Self-pay | Admitting: Orthopedic Surgery

## 2013-07-25 ENCOUNTER — Encounter (HOSPITAL_BASED_OUTPATIENT_CLINIC_OR_DEPARTMENT_OTHER): Payer: Self-pay | Admitting: *Deleted

## 2013-07-25 NOTE — Progress Notes (Signed)
Pt had her knee done here 5/14-very sharp-just had a cardiac stress test-good-ekg and labs just done

## 2013-07-30 ENCOUNTER — Other Ambulatory Visit (INDEPENDENT_AMBULATORY_CARE_PROVIDER_SITE_OTHER): Payer: Medicare Other

## 2013-07-30 DIAGNOSIS — I10 Essential (primary) hypertension: Secondary | ICD-10-CM

## 2013-07-30 DIAGNOSIS — I4891 Unspecified atrial fibrillation: Secondary | ICD-10-CM

## 2013-07-30 DIAGNOSIS — Z124 Encounter for screening for malignant neoplasm of cervix: Secondary | ICD-10-CM | POA: Diagnosis not present

## 2013-07-30 LAB — BASIC METABOLIC PANEL WITH GFR
BUN: 22 mg/dL (ref 6–23)
CO2: 32 meq/L (ref 19–32)
Calcium: 9.5 mg/dL (ref 8.4–10.5)
Chloride: 98 meq/L (ref 96–112)
Creatinine, Ser: 1.1 mg/dL (ref 0.4–1.2)
GFR: 64.61 mL/min (ref >60.00)
Glucose, Bld: 95 mg/dL (ref 70–99)
Potassium: 4.7 meq/L (ref 3.5–5.1)
Sodium: 135 meq/L (ref 135–145)

## 2013-07-30 NOTE — H&P (Signed)
[name] is an 80 y.o. female.   Chief Complaint: c/o STS symptoms right index finger with mass at A-1 pulley level HPI:   [name] is a breast cancer survivor treated 1995 to 2002.  She has had radiation. She has recently undergone a right knee arthroscopy by Melrose Nakayama in May of 2014 for a torn meniscus and myxoid cyst.  About the time of her knee surgery she noted a lump on the volar aspect of her right index finger overlying the flexor sheath at the A-1 pulley.  She has episodic triggering.    Past Medical History  Diagnosis Date  . Thyroid disease   . Glaucoma   . IBS (irritable bowel syndrome)   . Incontinence   . GERD (gastroesophageal reflux disease)   . Hypertension   . Anxiety   . Depression     Dr Cheryln Manly  . HX: breast cancer   . Osteoarthritis   . Osteopenia   . Bradycardia   . IBS (irritable bowel syndrome)   . Discoid lupus     skin  . Pacemaker     Brady//Chronotropic incompetence with normal pacemaker function  . Full dentures   . Wears hearing aid     both ears  . Hx of echocardiogram     a.  Echocardiogram (03/2011): Mild LVH, EF 82-50%, grade 1 diastolic dysfunction, mild MR, mild to moderate TR, PASP 42;  b. Echo (05/2013):  Mod LVH, EF 65-70%, no WMA, Gr 1 DD, mild MR, mod TR, PASP 39  . Hx of exercise stress test     a. ETT-echocardiogram (06/08/11): Sub-optimal exercise. Test stopped early due to dizziness and hypotension. EF 60%.  Non-diagnostic.;   b. Lexiscan Myoview (06/2013):  Diaphragmatic attenuation, no ischemia, EF 61%.  Low Risk  . Pacemaker     Past Surgical History  Procedure Laterality Date  . Pacemaker placement  2009  . Foot surgery  2004    Left-hammer toes  . Mastectomy  2002    bilat mastectomy-snbx  . Breast lumpectomy  1995    lt -axillary node dissectoin  . Dorsal compartment release  2000    left  . Abdominal hysterectomy    . Eye surgery  2011    cataract-lt  . Knee arthroscopy Left 08/29/2012    Procedure: LEFT KNEE  ARTHROSCOPY ;  Surgeon: Hessie Dibble, MD;  Location: [facility];  Service: Orthopedics;  Laterality: Left;    Family History  Problem Relation Age of Onset  . Ovarian cancer Mother   . Heart disease Father   . Stroke Sister   . Stroke Brother   . Cancer Brother   . Heart disease Brother    Social History:  reports that she has never smoked. She has never used smokeless tobacco. She reports that she does not drink alcohol or use illicit drugs.  Allergies:  Allergies  Allergen Reactions  . Amlodipine Besy-Benazepril Hcl Swelling  . Amlodipine Besylate Swelling  . Clonidine Hydrochloride     REACTION: tired  . Lactose Intolerance (Gi) Other (See Comments)  . Valsartan   . Verapamil     No prescriptions prior to admission    No results found for this or any previous visit (from the past 48 hour(s)).  No results found.   Pertinent items are noted in HPI.  Height 5\' 2"  (1.575 m), weight 78.019 kg (172 lb).  General appearance: alert Head: Normocephalic, without obvious abnormality Neck: supple, symmetrical, trachea midline Resp:  clear to auscultation bilaterally Cardio: regular rate and rhythm GI: normal findings: bowel sounds normal Extremities:  Inspection of her hands reveals a visible swelling over her right index finger, flexor sheath A-1 pulley in the palm. This measures 1.5 x 1 cm. by palpation.  It is mildly tender.  She has a 5 degree flexion contracture of her right index finger PIP joint with further flexion to 90 degrees.  She has mild triggering beneath the mass.  She has full range of motion of her long, ring and small fingers of the right hand, full range of motion of her thumbs bilaterally and all fingers on the left side.  She denies numbness.   Plain x-rays of her hands, four views, AP, lateral and two obliques demonstrate diffuse osteoarthritis of her small joints of a mild degree.  She has IP joint arthrosis at her thumb and Eaton stage  II CMC arthrosis.    Pulses: 2+ and symmetric Skin: normal Neurologic: Grossly normal    Assessment/Plan Impression: Chronic STS right index finger with mass A-1 pulley  Plan: To the OR for release A-1 pulley with excisional biopsy mass of A-1 pulley right index finger.The procedure, risks,benefits and post-op course were discussed with the patient at length and they were in agreement with the plan.  DASNOIT,[name] 07/30/2013, 3:43 PM   H&P documentation: 07/31/2013  -History and Physical Reviewed  -Patient has been re-examined  -No change in the plan of care  [name] Sickle, MD

## 2013-07-31 ENCOUNTER — Encounter (HOSPITAL_BASED_OUTPATIENT_CLINIC_OR_DEPARTMENT_OTHER): Payer: Self-pay | Admitting: Orthopedic Surgery

## 2013-07-31 ENCOUNTER — Ambulatory Visit (HOSPITAL_BASED_OUTPATIENT_CLINIC_OR_DEPARTMENT_OTHER)
Admission: RE | Admit: 2013-07-31 | Discharge: 2013-07-31 | Disposition: A | Discharge status: Home / Self Care | Payer: Medicare Other | Source: Ambulatory Visit | Attending: Orthopedic Surgery | Admitting: Orthopedic Surgery

## 2013-07-31 ENCOUNTER — Telehealth: Payer: Self-pay | Admitting: *Deleted

## 2013-07-31 ENCOUNTER — Ambulatory Visit (HOSPITAL_BASED_OUTPATIENT_CLINIC_OR_DEPARTMENT_OTHER): Payer: Medicare Other | Admitting: Anesthesiology

## 2013-07-31 ENCOUNTER — Encounter (HOSPITAL_BASED_OUTPATIENT_CLINIC_OR_DEPARTMENT_OTHER)
Admission: RE | Disposition: A | Discharge status: Home / Self Care | Payer: Self-pay | Source: Ambulatory Visit | Attending: Orthopedic Surgery

## 2013-07-31 ENCOUNTER — Encounter (HOSPITAL_BASED_OUTPATIENT_CLINIC_OR_DEPARTMENT_OTHER): Payer: Medicare Other | Admitting: Anesthesiology

## 2013-07-31 DIAGNOSIS — F411 Generalized anxiety disorder: Secondary | ICD-10-CM | POA: Diagnosis not present

## 2013-07-31 DIAGNOSIS — F329 Major depressive disorder, single episode, unspecified: Secondary | ICD-10-CM | POA: Insufficient documentation

## 2013-07-31 DIAGNOSIS — I498 Other specified cardiac arrhythmias: Secondary | ICD-10-CM | POA: Insufficient documentation

## 2013-07-31 DIAGNOSIS — E079 Disorder of thyroid, unspecified: Secondary | ICD-10-CM | POA: Insufficient documentation

## 2013-07-31 DIAGNOSIS — L930 Discoid lupus erythematosus: Secondary | ICD-10-CM | POA: Diagnosis not present

## 2013-07-31 DIAGNOSIS — M65839 Other synovitis and tenosynovitis, unspecified forearm: Secondary | ICD-10-CM | POA: Insufficient documentation

## 2013-07-31 DIAGNOSIS — I10 Essential (primary) hypertension: Secondary | ICD-10-CM | POA: Diagnosis not present

## 2013-07-31 DIAGNOSIS — M1990 Unspecified osteoarthritis, unspecified site: Secondary | ICD-10-CM | POA: Diagnosis not present

## 2013-07-31 DIAGNOSIS — F3289 Other specified depressive episodes: Secondary | ICD-10-CM | POA: Insufficient documentation

## 2013-07-31 DIAGNOSIS — M899 Disorder of bone, unspecified: Secondary | ICD-10-CM | POA: Diagnosis not present

## 2013-07-31 DIAGNOSIS — Z853 Personal history of malignant neoplasm of breast: Secondary | ICD-10-CM | POA: Diagnosis not present

## 2013-07-31 DIAGNOSIS — Z950 Presence of cardiac pacemaker: Secondary | ICD-10-CM | POA: Diagnosis not present

## 2013-07-31 DIAGNOSIS — M6530 Trigger finger, unspecified finger: Secondary | ICD-10-CM | POA: Diagnosis not present

## 2013-07-31 DIAGNOSIS — M949 Disorder of cartilage, unspecified: Secondary | ICD-10-CM | POA: Diagnosis not present

## 2013-07-31 DIAGNOSIS — Z9010 Acquired absence of unspecified breast and nipple: Secondary | ICD-10-CM | POA: Diagnosis not present

## 2013-07-31 DIAGNOSIS — M6740 Ganglion, unspecified site: Secondary | ICD-10-CM | POA: Diagnosis not present

## 2013-07-31 DIAGNOSIS — K219 Gastro-esophageal reflux disease without esophagitis: Secondary | ICD-10-CM | POA: Diagnosis not present

## 2013-07-31 DIAGNOSIS — H409 Unspecified glaucoma: Secondary | ICD-10-CM | POA: Diagnosis not present

## 2013-07-31 DIAGNOSIS — D492 Neoplasm of unspecified behavior of bone, soft tissue, and skin: Secondary | ICD-10-CM | POA: Diagnosis not present

## 2013-07-31 DIAGNOSIS — K589 Irritable bowel syndrome without diarrhea: Secondary | ICD-10-CM | POA: Diagnosis not present

## 2013-07-31 DIAGNOSIS — M65849 Other synovitis and tenosynovitis, unspecified hand: Secondary | ICD-10-CM

## 2013-07-31 HISTORY — PX: TRIGGER FINGER RELEASE: SHX641

## 2013-07-31 SURGERY — RELEASE, A1 PULLEY, FOR TRIGGER FINGER
Anesthesia: Monitor Anesthesia Care | Laterality: Right

## 2013-07-31 MED ORDER — FENTANYL CITRATE 0.05 MG/ML IJ SOLN
25.0000 ug | INTRAMUSCULAR | Status: DC | PRN
  Administered 2013-07-31: 25 ug via INTRAVENOUS

## 2013-07-31 MED ORDER — LACTATED RINGERS IV SOLN
INTRAVENOUS | Status: DC
  Administered 2013-07-31 (×2): via INTRAVENOUS

## 2013-07-31 MED ORDER — FENTANYL CITRATE 0.05 MG/ML IJ SOLN
INTRAMUSCULAR | Status: AC
  Filled 2013-07-31: qty 2

## 2013-07-31 MED ORDER — MIDAZOLAM HCL 2 MG/2ML IJ SOLN: 1.0000 mg | INTRAMUSCULAR | Status: DC | PRN

## 2013-07-31 MED ORDER — FENTANYL CITRATE 0.05 MG/ML IJ SOLN
INTRAMUSCULAR | Status: DC | PRN
  Administered 2013-07-31: 50 ug via INTRAVENOUS

## 2013-07-31 MED ORDER — ACETAMINOPHEN-CODEINE #2 300-15 MG PO TABS: 1.0000 | ORAL_TABLET | ORAL | Status: DC | PRN

## 2013-07-31 MED ORDER — CEFAZOLIN SODIUM-DEXTROSE 2-3 GM-% IV SOLR: 2.0000 g | INTRAVENOUS | Status: DC

## 2013-07-31 MED ORDER — PROPOFOL INFUSION 10 MG/ML OPTIME
INTRAVENOUS | Status: DC | PRN
  Administered 2013-07-31: 50 ug/kg/min via INTRAVENOUS

## 2013-07-31 MED ORDER — LIDOCAINE HCL 2 % IJ SOLN
INTRAMUSCULAR | Status: AC
  Filled 2013-07-31: qty 40

## 2013-07-31 MED ORDER — PROPOFOL 10 MG/ML IV BOLUS
INTRAVENOUS | Status: DC | PRN
  Administered 2013-07-31: 50 mg via INTRAVENOUS

## 2013-07-31 MED ORDER — LIDOCAINE HCL 2 % IJ SOLN
INTRAMUSCULAR | Status: DC | PRN
  Administered 2013-07-31: 1.5 mL

## 2013-07-31 MED ORDER — ONDANSETRON HCL 4 MG/2ML IJ SOLN: 4.0000 mg | Freq: Once | INTRAMUSCULAR | Status: DC | PRN

## 2013-07-31 MED ORDER — MIDAZOLAM HCL 2 MG/2ML IJ SOLN
INTRAMUSCULAR | Status: AC
  Filled 2013-07-31: qty 2

## 2013-07-31 MED ORDER — FENTANYL CITRATE 0.05 MG/ML IJ SOLN: 50.0000 ug | INTRAMUSCULAR | Status: DC | PRN

## 2013-07-31 MED ORDER — ONDANSETRON HCL 4 MG/2ML IJ SOLN
INTRAMUSCULAR | Status: DC | PRN
  Administered 2013-07-31: 4 mg via INTRAVENOUS

## 2013-07-31 MED ORDER — MIDAZOLAM HCL 5 MG/5ML IJ SOLN
INTRAMUSCULAR | Status: DC | PRN
  Administered 2013-07-31: 1 mg via INTRAVENOUS

## 2013-07-31 MED ORDER — CHLORHEXIDINE GLUCONATE 4 % EX LIQD: 60.0000 mL | Freq: Once | CUTANEOUS | Status: DC

## 2013-07-31 SURGICAL SUPPLY — 40 items
BANDAGE ADH SHEER 1  50/CT (GAUZE/BANDAGES/DRESSINGS) IMPLANT
BANDAGE COBAN STERILE 2 (GAUZE/BANDAGES/DRESSINGS) ×2 IMPLANT
BLADE SURG 15 STRL LF DISP TIS (BLADE) ×1 IMPLANT
BLADE SURG 15 STRL SS (BLADE) ×2
BNDG CMPR 9X4 STRL LF SNTH (GAUZE/BANDAGES/DRESSINGS)
BNDG ESMARK 4X9 LF (GAUZE/BANDAGES/DRESSINGS) IMPLANT
BRUSH SCRUB EZ PLAIN DRY (MISCELLANEOUS) ×2 IMPLANT
CORDS BIPOLAR (ELECTRODE) IMPLANT
COVER MAYO STAND STRL (DRAPES) ×2 IMPLANT
COVER TABLE BACK 60X90 (DRAPES) ×2 IMPLANT
CUFF TOURNIQUET SINGLE 18IN (TOURNIQUET CUFF) ×1 IMPLANT
DECANTER SPIKE VIAL GLASS SM (MISCELLANEOUS) IMPLANT
DRAPE EXTREMITY T 121X128X90 (DRAPE) ×2 IMPLANT
DRAPE SURG 17X23 STRL (DRAPES) ×2 IMPLANT
GLOVE BIO SURGEON STRL SZ 6.5 (GLOVE) ×1 IMPLANT
GLOVE BIOGEL M STRL SZ7.5 (GLOVE) ×2 IMPLANT
GLOVE BIOGEL PI IND STRL 7.0 (GLOVE) IMPLANT
GLOVE BIOGEL PI INDICATOR 7.0 (GLOVE) ×1
GLOVE ECLIPSE 6.5 STRL STRAW (GLOVE) ×2 IMPLANT
GLOVE ORTHO TXT STRL SZ7.5 (GLOVE) ×2 IMPLANT
GOWN STRL REUS W/ TWL LRG LVL3 (GOWN DISPOSABLE) ×1 IMPLANT
GOWN STRL REUS W/ TWL XL LVL3 (GOWN DISPOSABLE) ×2 IMPLANT
GOWN STRL REUS W/TWL LRG LVL3 (GOWN DISPOSABLE) ×4
GOWN STRL REUS W/TWL XL LVL3 (GOWN DISPOSABLE) ×2
NEEDLE 27GAX1X1/2 (NEEDLE) ×1 IMPLANT
PACK BASIN DAY SURGERY FS (CUSTOM PROCEDURE TRAY) ×2 IMPLANT
PADDING CAST ABS 4INX4YD NS (CAST SUPPLIES) ×1
PADDING CAST ABS COTTON 4X4 ST (CAST SUPPLIES) ×1 IMPLANT
SPONGE GAUZE 4X4 12PLY (GAUZE/BANDAGES/DRESSINGS) ×2 IMPLANT
STOCKINETTE 4X48 STRL (DRAPES) ×2 IMPLANT
STRIP CLOSURE SKIN 1/2X4 (GAUZE/BANDAGES/DRESSINGS) IMPLANT
SUT ETHILON 5 0 P 3 18 (SUTURE) ×1
SUT NYLON ETHILON 5-0 P-3 1X18 (SUTURE) IMPLANT
SUT PROLENE 3 0 PS 2 (SUTURE) IMPLANT
SUT PROLENE 4 0 P 3 18 (SUTURE) IMPLANT
SYR 3ML 23GX1 SAFETY (SYRINGE) IMPLANT
SYR CONTROL 10ML LL (SYRINGE) ×1 IMPLANT
TOWEL OR 17X24 6PK STRL BLUE (TOWEL DISPOSABLE) ×2 IMPLANT
TRAY DSU PREP LF (CUSTOM PROCEDURE TRAY) ×2 IMPLANT
UNDERPAD 30X30 INCONTINENT (UNDERPADS AND DIAPERS) ×2 IMPLANT

## 2013-07-31 NOTE — Brief Op Note (Signed)
07/31/2013  8:14 AM  PATIENT:  [name]  80 y.o. female  PRE-OPERATIVE DIAGNOSIS:  MASS RIGHT INDEX A-1 PULLEY  POST-OPERATIVE DIAGNOSIS:  MASS RIGHT INDEX A-1 PULLEY  PROCEDURE:  Procedure(s): EXCISE MASS RIGHT INDEX A-1 PLLLEY RELEASE A-1 RIGHT INDEX (Right)  SURGEON:  Surgeon(s) and Role:    * Cammie Sickle., MD - Primary  PHYSICIAN ASSISTANT:   ASSISTANTS: surgical tech   ANESTHESIA:   MAC  EBL:     BLOOD ADMINISTERED:none  DRAINS: none   LOCAL MEDICATIONS USED:  XYLOCAINE   SPECIMEN:  No Specimen  DISPOSITION OF SPECIMEN:  N/A  COUNTS:  YES  TOURNIQUET:   Total Tourniquet Time Documented: Upper Arm (Right) - 12 minutes Total: Upper Arm (Right) - 12 minutes   DICTATION: .Other Dictation: Dictation Number [id number]  PLAN OF CARE: Discharge to home after PACU  PATIENT DISPOSITION:  PACU - hemodynamically stable.   Delay start of Pharmacological VTE agent (>24hrs) due to surgical blood loss or risk of bleeding: not applicable

## 2013-07-31 NOTE — Telephone Encounter (Signed)
lmptcb for lab results 

## 2013-07-31 NOTE — Op Note (Signed)
NAMEVEGAS, COFFIN NO.:  [id number]  MEDICAL RECORD NO.:  [id number]  LOCATION:                                 FACILITY:  PHYSICIAN:  Youlanda Mighty. Maxime Beckner, M.D. DATE OF BIRTH:  1930-01-16  DATE OF PROCEDURE:  07/31/2013 DATE OF DISCHARGE:                              OPERATIVE REPORT   PREOPERATIVE DIAGNOSES: 1. Enlarging mass, right index finger flexor sheath at the distal     margin of the A1 pulley. 2. Chronic stenosing tenosynovitis of right index finger with flexion     contracture of proximal interphalangeal joint.  POSTOPERATIVE DIAGNOSES: 1. Large ganglion cyst at junction of A1 and A2 pulleys, right index     finger flexor sheath. 2. Also, chronic stenosing tenosynovitis, right index finger.  OPERATIONS: 1. Resection of myxoid cyst from retinacular sheath of right index     finger. 2. Release of A1 pulley and synovectomy of flexor tendons, right index     finger.  OPERATING SURGEON:  Youlanda Mighty. Kyli Sorter, MD  ASSISTANT:  Surgical technician.  ANESTHESIA:  2% lidocaine field block and flexor sheath block of index finger, right hand, supplemented by IV sedation.  SUPERVISING ANESTHESIOLOGIST:  Lorrene Reid, MD  INDICATIONS:  [name] is an 80 year old woman, referred for evaluation and management of a painful mass in her palm as well as chronic triggering of her right index finger.  She is a well-known former patient.  She has a number of background medical problems including a history of breast cancer and a history of cardiac illness, requiring placement of a pacemaker.  We had a communication with her primary care physician, as well as communication with her attending cardiologist, Dr. Caryl Comes and received proper instruction regarding management of her pacemaker perioperatively.  She was interviewed by Dr. Al Corpus of Anesthesia preoperatively.  IV sedation and local anesthesia was anticipated.  We advised Ms. Warmoth that we did not  anticipate the need for use of bipolar cautery or electrosurgery with this relatively straight for procedure.  After detailed informed consent in my office, also informed consent by Dr. Al Corpus and myself in the holding area, she was brought to the operating room at this time for excision of her mass and release of her right index finger A1 pulley with synovectomy as needed.  Questions were invited and answered in detail with her daughter and son present in the holding area.  DESCRIPTION OF PROCEDURE:  [name] was brought to room 2 of the [facility], placed in supine position on the operating table.  Following IV sedation under Dr. Al Corpus' direct supervision, the right hand was prepped with alcohol and Betadine and 1.5 mL of 2% plain lidocaine were infiltrated into the skin and into the flexor sheath of the index finger.  After few minutes, excellent anesthesia was achieved. The right hand and arm were then prepped with Betadine soap and solution, sterilely draped.  A pneumatic tourniquet was applied to the proximal right brachium.  Following exsanguination of the right arm with Esmarch bandage, arterial tourniquet was inflated to 240 mmHg due to mild systolic hypertension.  A routine surgical time-out was accomplished followed by placement  of Ms. Frick's fingers in a lead hand in extension.  We made a short oblique incision at the junction of the middle and distal palmar creases near the index metacarpal head.  We subsequently identified the radial proper digital nerve to the index finger and gently retracted this with a Ragnell retractor.  There was a large myxoid cyst deep to the fascia.  This was circumferentially dissected, gently dissected away from the neurovascular structures and excised with scissors.  The contents of the cyst were empty.  This was a classic myxoid cyst.  Attention was then directed to the A1 pulley.  There was a very interesting finding of a  very thick cuff of 1.5 mm of scar tissue around the A1 pulley.  This was released as a separate layer followed by identification of the A1 pulley proper.  The A1 pulley was split with scissors and the fascia proximally was released.  We delivered the flexor tendons and excised a very thick cuff of fibrotic tenosynovium and debriding the superficialis and profundus tendon slips.  Thereafter, full range of motion of the IP joints of the finger was recovered.  The wound did not require hemostasis.  The wound was repaired with intradermal 3-0 Prolene followed by application of compressive dressing, with a Steri-Strip, sterile gauze, and a Coban.  There were no apparent complications.     Youlanda Mighty Zierra Laroque, M.D.     RVS/MEDQ  D:  07/31/2013  T:  07/31/2013  Job:  [id number]

## 2013-07-31 NOTE — Discharge Instructions (Addendum)

## 2013-07-31 NOTE — Op Note (Signed)
[id number] 

## 2013-07-31 NOTE — Anesthesia Postprocedure Evaluation (Signed)
  Anesthesia Post-op Note  Patient: [name]  Procedure(s) Performed: Procedure(s): EXCISE MASS RIGHT INDEX A-1 PLLLEY RELEASE A-1 RIGHT INDEX (Right)  Patient Location: PACU  Anesthesia Type:MAC  Level of Consciousness: awake, alert  and oriented  Airway and Oxygen Therapy: Patient Spontanous Breathing and Patient connected to face mask oxygen  Post-op Pain: mild  Post-op Assessment: Post-op Vital signs reviewed  Post-op Vital Signs: Reviewed  Complications: No apparent anesthesia complications

## 2013-07-31 NOTE — Transfer of Care (Signed)
Immediate Anesthesia Transfer of Care Note  Patient: [name]  Procedure(s) Performed: Procedure(s): EXCISE MASS RIGHT INDEX A-1 PLLLEY RELEASE A-1 RIGHT INDEX (Right)  Patient Location: PACU  Anesthesia Type:MAC  Level of Consciousness: awake, alert  and oriented  Airway & Oxygen Therapy: Patient Spontanous Breathing and Patient connected to face mask oxygen  Post-op Assessment: Report given to PACU RN and Post -op Vital signs reviewed and stable  Post vital signs: Reviewed and stable  Complications: No apparent anesthesia complications

## 2013-07-31 NOTE — Telephone Encounter (Signed)
pt notified about lab results with verbalized understanding

## 2013-07-31 NOTE — Anesthesia Preprocedure Evaluation (Addendum)
Anesthesia Evaluation  Patient identified by MRN, date of birth, ID band Patient awake    Reviewed: Allergy & Precautions, H&P , NPO status , Patient's Chart, lab work & pertinent test results, reviewed documented beta blocker date and time   Airway       Dental   Pulmonary          Cardiovascular hypertension,     Neuro/Psych    GI/Hepatic   Endo/Other    Renal/GU      Musculoskeletal   Abdominal   Peds  Hematology   Anesthesia Other Findings   Reproductive/Obstetrics                          Anesthesia Physical Anesthesia Plan Anesthesia Quick Evaluation

## 2013-07-31 NOTE — Telephone Encounter (Signed)
New message ° ° ° ° ° °Returning a nurses call to get test results °

## 2013-08-02 ENCOUNTER — Encounter (HOSPITAL_BASED_OUTPATIENT_CLINIC_OR_DEPARTMENT_OTHER): Payer: Self-pay | Admitting: Orthopedic Surgery

## 2013-08-06 ENCOUNTER — Ambulatory Visit: Payer: Medicare Other | Admitting: Psychology

## 2013-08-07 ENCOUNTER — Ambulatory Visit (INDEPENDENT_AMBULATORY_CARE_PROVIDER_SITE_OTHER): Payer: Medicare Other | Admitting: Internal Medicine

## 2013-08-07 ENCOUNTER — Encounter: Payer: Self-pay | Admitting: Internal Medicine

## 2013-08-07 VITALS — BP 132/82 | HR 60 | Temp 98.2°F | Ht 62.0 in | Wt 180.0 lb

## 2013-08-07 DIAGNOSIS — R0600 Dyspnea, unspecified: Secondary | ICD-10-CM

## 2013-08-07 DIAGNOSIS — R0609 Other forms of dyspnea: Secondary | ICD-10-CM

## 2013-08-07 DIAGNOSIS — I10 Essential (primary) hypertension: Secondary | ICD-10-CM

## 2013-08-07 DIAGNOSIS — J309 Allergic rhinitis, unspecified: Secondary | ICD-10-CM | POA: Diagnosis not present

## 2013-08-07 DIAGNOSIS — R0989 Other specified symptoms and signs involving the circulatory and respiratory systems: Secondary | ICD-10-CM

## 2013-08-07 MED ORDER — BISOPROLOL FUMARATE 5 MG PO TABS: ORAL_TABLET | ORAL | Status: DC

## 2013-08-07 NOTE — Progress Notes (Signed)
Subjective:    Patient ID: [name], female    DOB: 07/03/1929  MRN: [id number]   Brief patient profile:  52 yobf never smoker with h/o sneezing/itching/coughing  eval 1979 and placed on allergy shots ever since but nose runs year round  if doesn't  take zyrtec developed new unexplained doe 2014 self referred to pulmonary clinic 06/20/2013 with nl  pfts 07/02/13   History of Present Illness  06/20/2013 1st [facility] office visit/ Wert cc progressive doe x one year indolent onset to point where can't do steps at [place], can't finish silver sneakers, can't walk a mile, can do grocery shopping but has to rest before going across the parking lot.  Hb during this time worse than usual on aciphex ac and pc rec Stop visken and start bystolic 5 mg one twice daily  Continue aciphex 20 mg Take 30- 60 min before your first and last meals of the day    07/02/2013 f/u ov/Wert re: unexplained sob Chief Complaint  Patient presents with  . Follow-up    Pt reports her breathing is unchanged since her last visit. She c/o HA since she started taking bystolic.   ha is random, transient, not related to time of day or activity, generalized, s nausea of neuro/viz cc's rec Finish your cardiac evaluation first, then if not doing better call [facility] [phone] for CPST with spirometry before and after > neg myoview, was not physically stressed at any point   08/07/2013 f/u ov/Wert re: unexplained doe, takes various alternative rx/ "nothing worksAdvertising account planner Complaint  Patient presents with  . Follow-up    Breathing is unchanged since last visit. She states that Cardiology ruled out any heart problems and advised her to f/u here.    no better or worse doe since onset, never occurs at rest or sleeping, still can't do a grocery store s stopping to rest   DOE s   obvious day to day or daytime variabilty or assoc chronic cough or cp or chest tightness, subjective wheeze overt sinus or hb symptoms. No unusual exp hx or  h/o childhood pna/ asthma or knowledge of premature birth.  Sleeping ok without nocturnal  or early am exacerbation  of respiratory  c/o's or need for noct saba. Also denies any obvious fluctuation of symptoms with weather or environmental changes or other aggravating or alleviating factors except as outlined above   Current Medications, Allergies, Complete Past Medical History, Past Surgical History, Family History, and Social History were reviewed in [employer] record.  ROS  The following are not active complaints unless bolded sore throat, dysphagia, dental problems, itching, sneezing,  nasal congestion or excess/ purulent secretions, ear ache,   fever, chills, sweats, unintended wt loss, pleuritic or exertional cp, hemoptysis,  orthopnea pnd or leg swelling, presyncope, palpitations, heartburn, abdominal pain, anorexia, nausea, vomiting, diarrhea  or change in bowel or urinary habits, change in stools or urine, dysuria,hematuria,  rash, arthralgias, visual complaints, headache, numbness weakness or ataxia or problems with walking or coordination,  change in mood/affect or memory.          Objective:   Physical Exam  07/02/2013         171 > 08/07/13  180 Wt Readings from Last 3 Encounters:  06/20/13 178 lb 9.6 oz (81.012 kg)  05/30/13 171 lb (77.565 kg)  05/29/13 171 lb (77.565 kg)      HEENT: nl dentition, turbinates, and orophanx. Nl external ear canals without cough  reflex   NECK :  without JVD/Nodes/TM/ nl carotid upstrokes bilaterally   LUNGS: no acc muscle use, clear to A and P bilaterally without cough on insp or exp maneuvers   CV:  RRR  no s3 or murmur or increase in P2, no edema   ABD:  soft and nontender with nl excursion in the supine position. No bruits or organomegaly, bowel sounds nl  MS:  warm without deformities, calf tenderness, cyanosis or clubbing  SKIN: warm and dry without lesions    NEURO:  alert, approp, no de    CXR  07/02/2013  : 1. Postoperative changes. 2. Mild cardiomegaly. No edema. 3. No focal acute pulmonary abnormality.        Assessment & Plan:

## 2013-08-07 NOTE — Patient Instructions (Addendum)
Zebeta 5 mg reduce to  one half tablet daily   Take only the medications on your list as they are on your after visit summary   If heart burn acts up, ok to resume aciphex 20 mg Take 30-60 min before first meal of the day and pepcid 20 mg on at bedtime   See Tammy NP w/in 2 weeks with all your medications, even over the counter meds, separated in two separate bags, the ones you take no matter what vs the ones you stop once you feel better and take only as needed when you feel you need them.   Tammy  will generate for you a new user friendly medication calendar that will put [place] all on the same page re: your medication use.     Without this process, it simply isn't possible to assure that we are providing  your outpatient care  with  the attention to detail we feel you deserve.   If we cannot assure that you're getting that kind of care,  then we cannot manage your problem effectively from this clinic.  Once you have seen Tammy and we are sure that we're all on the same page with your medication use she will arrange follow up with me.  Add Recheck tsh next ov when confirm dose of synthroid

## 2013-08-08 ENCOUNTER — Telehealth: Payer: Self-pay | Admitting: Internal Medicine

## 2013-08-08 ENCOUNTER — Encounter: Payer: Self-pay | Admitting: Internal Medicine

## 2013-08-08 ENCOUNTER — Ambulatory Visit (INDEPENDENT_AMBULATORY_CARE_PROVIDER_SITE_OTHER): Payer: Medicare Other | Admitting: *Deleted

## 2013-08-08 DIAGNOSIS — I495 Sick sinus syndrome: Secondary | ICD-10-CM

## 2013-08-08 DIAGNOSIS — I4729 Other ventricular tachycardia: Secondary | ICD-10-CM | POA: Diagnosis not present

## 2013-08-08 DIAGNOSIS — I4891 Unspecified atrial fibrillation: Secondary | ICD-10-CM | POA: Diagnosis not present

## 2013-08-08 DIAGNOSIS — I472 Ventricular tachycardia: Secondary | ICD-10-CM | POA: Diagnosis not present

## 2013-08-08 NOTE — Assessment & Plan Note (Addendum)
Chronic - poss multifactorial - 03/2011 CT angio was nl  - ECHO done on  06/06/13  With bnp 103 (05/29/13) - Left ventricle: The cavity size was normal. There was moderate concentric hypertrophy. Systolic function was Vigorous. . Doppler parameters are consistent with abnormal left ventricular relaxation (grade 1 diastolic dysfunction).  - Pulmonary arteries: Systolic pressure was mildly increased. PA peak pressure: 37mm Hg -  06/20/2013  Walked RA  2 laps @ 185 ft each stopped due to  Sob no desat - 08/07/2013  Walked RA x 2 laps @ 185 ft each stopped due to  Sob with peak HR only 66 and sats ok -  07/02/13 PFT's nl with dlco that corrects to nl   She may be suffering from chronotropic incompetence from the use of BB which were changed to reduced risk of asthma but note pulse only 66 after 2 laps and "exhausted" at that point so no doubt she could tol formal cpst, which is the next step typically in the w/u  I had an extended discussion with the patient today lasting 15 to 20 minutes of a 25 minute visit on the following issues: Reviewed studies to date and need to simplify/ modify rx since now working For now try reduce bisoprolol to 2.5 mg daily and return within 2 week for med reconciliation off all alternative rx for now   To keep things simple, I have asked the patient to first separate medicines that are perceived as maintenance, that is to be taken daily "no matter what", from those medicines that are taken on only on an as-needed basis and I have given the patient examples of both, and then return to see our NP to generate a  detailed  medication calendar which should be followed until the next physician sees the patient and updates it.

## 2013-08-08 NOTE — Assessment & Plan Note (Signed)
Changed visken to bisoprolol 06/20/13 - reduced bisoprolol to 2.5 mg daily 08/08/2013 due to low HR resp with ex

## 2013-08-08 NOTE — Telephone Encounter (Signed)
Spoke with pt. She is aware metamucill has been added to medication list

## 2013-08-10 ENCOUNTER — Encounter: Payer: Self-pay | Admitting: Internal Medicine

## 2013-08-10 ENCOUNTER — Ambulatory Visit (INDEPENDENT_AMBULATORY_CARE_PROVIDER_SITE_OTHER): Payer: Medicare Other | Admitting: Internal Medicine

## 2013-08-10 VITALS — BP 148/88 | HR 76 | Temp 98.1°F | Resp 16 | Wt 177.0 lb

## 2013-08-10 DIAGNOSIS — I10 Essential (primary) hypertension: Secondary | ICD-10-CM | POA: Diagnosis not present

## 2013-08-10 DIAGNOSIS — R0989 Other specified symptoms and signs involving the circulatory and respiratory systems: Secondary | ICD-10-CM

## 2013-08-10 DIAGNOSIS — E559 Vitamin D deficiency, unspecified: Secondary | ICD-10-CM

## 2013-08-10 DIAGNOSIS — F329 Major depressive disorder, single episode, unspecified: Secondary | ICD-10-CM | POA: Diagnosis not present

## 2013-08-10 DIAGNOSIS — F3289 Other specified depressive episodes: Secondary | ICD-10-CM

## 2013-08-10 DIAGNOSIS — F411 Generalized anxiety disorder: Secondary | ICD-10-CM | POA: Diagnosis not present

## 2013-08-10 DIAGNOSIS — K589 Irritable bowel syndrome without diarrhea: Secondary | ICD-10-CM

## 2013-08-10 DIAGNOSIS — R0609 Other forms of dyspnea: Secondary | ICD-10-CM

## 2013-08-10 DIAGNOSIS — R0600 Dyspnea, unspecified: Secondary | ICD-10-CM

## 2013-08-10 NOTE — Assessment & Plan Note (Signed)
  On diet  

## 2013-08-10 NOTE — Assessment & Plan Note (Signed)
Continue with current prescription therapy as reflected on the Med list.  

## 2013-08-10 NOTE — Progress Notes (Signed)
Patient ID: [name], female   DOB: [birth year], 80 y.o.   MRN: [id number]   Subjective:     HPI     F/u L knee post-op swelling  The patient is here to follow up on chronic IBS worse lately, GERD,  depression, anxiety, SOB symptoms controlled with medicines.  F/u  legs are fatigued. No LBP... F/u ongoing IBS sx's, stress - anal fissure. She saw Dr Earlean Shawl - endoscopy is planned. She is planning to be seen at [facility].    Wt Readings from Last 3 Encounters:  08/10/13 177 lb (80.287 kg)  08/07/13 180 lb (81.647 kg)  07/31/13 179 lb 12.8 oz (81.557 kg)   BP Readings from Last 3 Encounters:  08/10/13 148/88  08/07/13 132/82  07/31/13 148/71      Past Medical History  Diagnosis Date  . Thyroid disease   . Glaucoma   . IBS (irritable bowel syndrome)   . Incontinence   . GERD (gastroesophageal reflux disease)   . Hypertension   . Anxiety   . Depression     Dr Cheryln Manly  . HX: breast cancer   . Osteoarthritis   . Osteopenia   . Bradycardia   . IBS (irritable bowel syndrome)   . Discoid lupus     skin  . Pacemaker     Brady//Chronotropic incompetence with normal pacemaker function  . Full dentures   . Wears hearing aid     both ears  . Hx of echocardiogram     a.  Echocardiogram (03/2011): Mild LVH, EF 27-06%, grade 1 diastolic dysfunction, mild MR, mild to moderate TR, PASP 42;  b. Echo (05/2013):  Mod LVH, EF 65-70%, no WMA, Gr 1 DD, mild MR, mod TR, PASP 39  . Hx of exercise stress test     a. ETT-echocardiogram (06/08/11): Sub-optimal exercise. Test stopped early due to dizziness and hypotension. EF 60%.  Non-diagnostic.;   b. Lexiscan Myoview (06/2013):  Diaphragmatic attenuation, no ischemia, EF 61%.  Low Risk  . Pacemaker    Past Surgical History  Procedure Laterality Date  . Pacemaker placement  2009  . Foot surgery  2004    Left-hammer toes  . Mastectomy  2002    bilat mastectomy-snbx  . Breast lumpectomy  1995    lt -axillary node dissectoin  . Dorsal  compartment release  2000    left  . Abdominal hysterectomy    . Eye surgery  2011    cataract-lt  . Knee arthroscopy Left 08/29/2012    Procedure: LEFT KNEE ARTHROSCOPY ;  Surgeon: Hessie Dibble, MD;  Location: [facility];  Service: Orthopedics;  Laterality: Left;  . Trigger finger release Right 07/31/2013    Procedure: EXCISE MASS RIGHT INDEX A-1 PLLLEY RELEASE A-1 RIGHT INDEX;  Surgeon: Cammie Sickle., MD;  Location: [facility];  Service: Orthopedics;  Laterality: Right;    reports that she has never smoked. She has never used smokeless tobacco. She reports that she does not drink alcohol or use illicit drugs. family history includes Cancer in her brother; Heart disease in her brother and father; Ovarian cancer in her mother; Stroke in her brother and sister. Allergies  Allergen Reactions  . Amlodipine Besy-Benazepril Hcl Swelling  . Amlodipine Besylate Swelling  . Clonidine Hydrochloride     REACTION: tired  . Lactose Intolerance (Gi) Other (See Comments)  . Valsartan   . Verapamil    Current Outpatient Prescriptions on File Prior to Visit  Medication Sig Dispense Refill  . allopurinol (ZYLOPRIM) 300 MG tablet TAKE ONE TABLET BY MOUTH EVERY DAY as needed      . bisoprolol (ZEBETA) 5 MG tablet Take one half daily  30 tablet  11  . Cetirizine HCl (ZYRTEC ALLERGY) 10 MG CAPS Take 1 capsule by mouth daily as needed.       [name] EPIPEN 2-PAK 0.3 MG/0.3ML DEVI Inject 1 Device into the muscle daily as needed. For allergic reaction      . escitalopram (LEXAPRO) 10 MG tablet Take 1 tablet (10 mg total) by mouth daily.  90 tablet  0  . levothyroxine (SYNTHROID, LEVOTHROID) 100 MCG tablet TAKE ONE TABLET BY MOUTH EVERY DAY  90 tablet  1  . LORazepam (ATIVAN) 0.5 MG tablet TAKE ONE TABLET BY MOUTH ONCE DAILY AS NEEDED FOR ANXIETY  30 tablet  2  . oxybutynin (DITROPAN) 5 MG tablet TAKE ONE TABLET BY MOUTH THREE TIMES DAILY  90 tablet  5  . Psyllium (METAMUCIL PO)  Take by mouth. Once daily      . spironolactone (ALDACTONE) 25 MG tablet Take one tablet 3 times weekly       No current facility-administered medications on file prior to visit.   Review of Systems Review of Systems  Constitutional: Negative for diaphoresis and unexpected weight change.  HENT: Negative for drooling and tinnitus.   Eyes: Negative for photophobia and visual disturbance.  Respiratory: Negative for choking and stridor.   Gastrointestinal: Negative for vomiting and blood in stool.  Genitourinary: Negative for hematuria and decreased urine volume.  Musculoskeletal: Negative for gait problem.  Skin: Negative for color change and wound.  Neurological: Negative for tremors and numbness.  Psychiatric/Behavioral: Negative for decreased concentration. The patient is not hyperactive.   Knee pain    Objective:   Physical Exam BP 148/88  Pulse 76  Temp(Src) 98.1 F (36.7 C) (Oral)  Resp 16  Wt 177 lb (80.287 kg) Physical Exam  VS noted Constitutional: Pt appears well-developed and well-nourished.  HENT: Head: Normocephalic.  Right Ear: External ear normal.  Left Ear: External ear normal.  Eyes: Conjunctivae and EOM are normal. Pupils are equal, round, and reactive to light.  Neck: Normal range of motion. Neck supple.  Cardiovascular: Normal rate and regular rhythm.   Pulmonary/Chest: Effort normal and breath sounds normal. no rales or wheezing Abd:  Soft, NT, non-distended, + BS Neurological: Pt is alert. No cranial nerve deficit.  Skin: Skin is warm. No erythema.  Psychiatric: Pt behavior is normal. Thought content normal.  L knee is a little tender to palp CT angio - ok ECHO - OK for age  Lab Results  Component Value Date   WBC 4.7 05/29/2013   HGB 13.5 05/29/2013   HCT 41.9 05/29/2013   PLT 223.0 05/29/2013   GLUCOSE 95 07/30/2013   CHOL 174 05/11/2010   TRIG 78.0 05/11/2010   HDL 52.30 05/11/2010   LDLDIRECT 130.8 09/30/2006   LDLCALC 106* 05/11/2010   ALT 12 01/01/2013    AST 19 01/01/2013   NA 135 07/30/2013   K 4.7 07/30/2013   CL 98 07/30/2013   CREATININE 1.1 07/30/2013   BUN 22 07/30/2013   CO2 32 07/30/2013   TSH 1.51 01/01/2013   INR 1.00 12/13/2009   HGBA1C 6.0 04/10/2012       Assessment & Plan:

## 2013-08-10 NOTE — Progress Notes (Signed)
Pre visit review using our clinic review tool, if applicable. No additional management support is needed unless otherwise documented below in the visit note. 

## 2013-08-13 ENCOUNTER — Telehealth: Payer: Self-pay | Admitting: Internal Medicine

## 2013-08-13 ENCOUNTER — Ambulatory Visit: Payer: Medicare Other | Admitting: Psychology

## 2013-08-13 DIAGNOSIS — M6740 Ganglion, unspecified site: Secondary | ICD-10-CM | POA: Diagnosis not present

## 2013-08-13 DIAGNOSIS — M6530 Trigger finger, unspecified finger: Secondary | ICD-10-CM | POA: Diagnosis not present

## 2013-08-13 NOTE — Telephone Encounter (Signed)
Relevant patient education assigned to patient using Emmi. ° °

## 2013-08-14 ENCOUNTER — Ambulatory Visit (INDEPENDENT_AMBULATORY_CARE_PROVIDER_SITE_OTHER): Payer: Medicare Other | Admitting: Psychology

## 2013-08-14 DIAGNOSIS — F331 Major depressive disorder, recurrent, moderate: Secondary | ICD-10-CM | POA: Diagnosis not present

## 2013-08-16 DIAGNOSIS — M6740 Ganglion, unspecified site: Secondary | ICD-10-CM | POA: Diagnosis not present

## 2013-08-16 DIAGNOSIS — M6530 Trigger finger, unspecified finger: Secondary | ICD-10-CM | POA: Diagnosis not present

## 2013-08-17 ENCOUNTER — Telehealth: Payer: Self-pay | Admitting: Internal Medicine

## 2013-08-17 LAB — MDC_IDC_ENUM_SESS_TYPE_REMOTE
Battery Impedance: 1099 Ohm
Brady Statistic AP VS Percent: 81 %
Brady Statistic AS VP Percent: 0 %
Brady Statistic AS VS Percent: 19 %
Date Time Interrogation Session: 20150416124926
Lead Channel Impedance Value: 524 Ohm
Lead Channel Pacing Threshold Pulse Width: 0.4 ms
Lead Channel Pacing Threshold Pulse Width: 0.4 ms
Lead Channel Sensing Intrinsic Amplitude: 5.6 mV
Lead Channel Setting Pacing Amplitude: 2 V
Lead Channel Setting Pacing Amplitude: 2.5 V
MDC IDC MSMT BATTERY REMAINING LONGEVITY: 55 mo
MDC IDC MSMT BATTERY VOLTAGE: 2.78 V
MDC IDC MSMT LEADCHNL RA IMPEDANCE VALUE: 460 Ohm
MDC IDC MSMT LEADCHNL RA PACING THRESHOLD AMPLITUDE: 0.75 V
MDC IDC MSMT LEADCHNL RV PACING THRESHOLD AMPLITUDE: 0.5 V
MDC IDC SET LEADCHNL RV PACING PULSEWIDTH: 0.4 ms
MDC IDC SET LEADCHNL RV SENSING SENSITIVITY: 2 mV
MDC IDC STAT BRADY AP VP PERCENT: 0 %

## 2013-08-17 NOTE — Telephone Encounter (Signed)
Per OV 08/07/13; Zebeta 5 mg reduce to  one half tablet daily   Take only the medications on your list as they are on your after visit summary  If heart burn acts up, ok to resume aciphex 20 mg Take 30-60 min before first meal of the day and pepcid 20 mg on at bedtime  See Tammy NP w/in 2 weeks with all your medications, even over the counter meds, separated in two separate bags, the ones you take no matter what vs the ones you stop once you feel better and take only as needed when you feel you need them.  Tammy  will generate for you a new user friendly medication calendar that will put [place] all on the same page re: your medication use.   Without this process, it simply isn't possible to assure that we are providing  your outpatient care  with  the attention to detail we feel you deserve.   If we cannot assure that you're getting that kind of care,  then we cannot manage your problem effectively from this clinic. Once you have seen Tammy and we are sure that we're all on the same page with your medication use she will arrange follow up with me.   Add Recheck tsh next ov when confirm dose of synthroid  ---  Called spoke w/ pt. She  Reports her reflux has been acting up. Wanted to know if she could resume her medications for this back. i advised her per last OV note he stated if this happened she could. She then remembered he said this to her. Nothing further needed

## 2013-08-20 ENCOUNTER — Ambulatory Visit (INDEPENDENT_AMBULATORY_CARE_PROVIDER_SITE_OTHER): Payer: Medicare Other | Admitting: Psychology

## 2013-08-20 DIAGNOSIS — F331 Major depressive disorder, recurrent, moderate: Secondary | ICD-10-CM

## 2013-08-21 ENCOUNTER — Encounter: Payer: Self-pay | Admitting: Adult Health

## 2013-08-21 DIAGNOSIS — M6740 Ganglion, unspecified site: Secondary | ICD-10-CM | POA: Diagnosis not present

## 2013-08-21 DIAGNOSIS — M6530 Trigger finger, unspecified finger: Secondary | ICD-10-CM | POA: Diagnosis not present

## 2013-08-22 ENCOUNTER — Other Ambulatory Visit: Payer: Self-pay | Admitting: Internal Medicine

## 2013-08-23 ENCOUNTER — Other Ambulatory Visit (INDEPENDENT_AMBULATORY_CARE_PROVIDER_SITE_OTHER): Payer: Medicare Other

## 2013-08-23 ENCOUNTER — Ambulatory Visit (INDEPENDENT_AMBULATORY_CARE_PROVIDER_SITE_OTHER): Payer: Medicare Other | Admitting: Adult Health

## 2013-08-23 ENCOUNTER — Encounter: Payer: Self-pay | Admitting: Adult Health

## 2013-08-23 VITALS — BP 122/88 | HR 62 | Temp 98.3°F | Ht 62.0 in

## 2013-08-23 DIAGNOSIS — R0989 Other specified symptoms and signs involving the circulatory and respiratory systems: Secondary | ICD-10-CM

## 2013-08-23 DIAGNOSIS — R0600 Dyspnea, unspecified: Secondary | ICD-10-CM

## 2013-08-23 DIAGNOSIS — R0609 Other forms of dyspnea: Secondary | ICD-10-CM

## 2013-08-23 LAB — TSH: TSH: 0.5 u[IU]/mL (ref 0.35–5.50)

## 2013-08-23 NOTE — Assessment & Plan Note (Addendum)
DOE ? Etiology   Patient's medications were reviewed today and patient education was given. Computerized medication calendar was adjusted/completed  Workup has been unrevealing w/ neg card stress test, echo w/ Grade 1 DD, no ambulatory desats, nml PFT , cxr w/ no acute process.  ? Reactive airways however with no wheezing or significant cough  Cont to monitor on current regimen  Check tsh today  follow up 4 weeks with Dr. Melvyn Novas   Please contact office for sooner follow up if symptoms do not improve or worsen or seek emergency care

## 2013-08-23 NOTE — Patient Instructions (Signed)
Continue on current regimen  Labs today.  Follow med calendar closely and bring to each visit.  Follow up Dr. Melvyn Novas  In 4 weeks and As needed   Please contact office for sooner follow up if symptoms do not improve or worsen or seek emergency care

## 2013-08-23 NOTE — Progress Notes (Signed)
Quick Note:  Called spoke with patient, advised of lab results / recs as stated by TP. Pt verbalized her understanding and denied any questions. ______ 

## 2013-08-23 NOTE — Progress Notes (Signed)
Subjective:    Patient ID: [name], female    DOB: 02/20/1930  MRN: [id number]   Brief patient profile:  59 yobf never smoker with h/o sneezing/itching/coughing  eval 1979 and placed on allergy shots ever since but nose runs year round  if doesn't  take zyrtec developed new unexplained doe 2014 self referred to pulmonary clinic 06/20/2013 with nl  pfts 07/02/13   History of Present Illness  06/20/2013 1st  Pulmonary office visit/ Wert cc progressive doe x one year indolent onset to point where can't do steps at [place], can't finish silver sneakers, can't walk a mile, can do grocery shopping but has to rest before going across the parking lot.  Hb during this time worse than usual on aciphex ac and pc rec Stop visken and start bystolic 5 mg one twice daily  Continue aciphex 20 mg Take 30- 60 min before your first and last meals of the day    07/02/2013 f/u ov/Wert re: unexplained sob Chief Complaint  Patient presents with  . Follow-up    Pt reports her breathing is unchanged since her last visit. She c/o HA since she started taking bystolic.   ha is random, transient, not related to time of day or activity, generalized, s nausea of neuro/viz cc's rec Finish your cardiac evaluation first, then if not doing better call [facility] [phone] for CPST with spirometry before and after > neg myoview, was not physically stressed at any point   08/07/2013 f/u ov/Wert re: unexplained doe, takes various alternative rx/ "nothing worksAdvertising account planner Complaint  Patient presents with  . Follow-up    Breathing is unchanged since last visit. She states that Cardiology ruled out any heart problems and advised her to f/u here.    no better or worse doe since onset, never occurs at rest or sleeping, still can't do a grocery store s stopping to rest   08/23/2013 followup and medication review. Patient returns for a followup visit and medication review. Reviewed all her medications and organized them into a  medication calendar. Patient education. Appears the patient is taking her medication currently. Says her dyspnea is unchanged since last ov; no new complaints. No  Cough , wheezing , chest pain, orthopnea, or edema.    Current Medications, Allergies, Complete Past Medical History, Past Surgical History, Family History, and Social History were reviewed in [employer] record.  ROS  The following are not active complaints unless bolded sore throat, dysphagia, dental problems, itching, sneezing,  nasal congestion or excess/ purulent secretions, ear ache,   fever, chills, sweats, unintended wt loss, pleuritic or exertional cp, hemoptysis,  orthopnea pnd or leg swelling, presyncope, palpitations, heartburn, abdominal pain, anorexia, nausea, vomiting, diarrhea  or change in bowel or urinary habits, change in stools or urine, dysuria,hematuria,  rash, arthralgias, visual complaints, headache, numbness weakness or ataxia or problems with walking or coordination,  change in mood/affect or memory.          Objective:   Physical Exam  07/02/2013         171 > 08/07/13  180 >     HEENT: nl dentition, turbinates, and orophanx. Nl external ear canals without cough reflex   NECK :  without JVD/Nodes/TM/ nl carotid upstrokes bilaterally   LUNGS: no acc muscle use, clear to A and P bilaterally without cough on insp or exp maneuvers   CV:  RRR  no s3 or murmur or increase in P2, no edema   ABD:  soft and nontender with nl excursion in the supine position. No bruits or organomegaly, bowel sounds nl  MS:  warm without deformities, calf tenderness, cyanosis or clubbing  SKIN: warm and dry without lesions    NEURO:  alert, approp, no de    CXR  07/02/2013 : 1. Postoperative changes. 2. Mild cardiomegaly. No edema. 3. No focal acute pulmonary abnormality.        Assessment & Plan:

## 2013-08-27 ENCOUNTER — Ambulatory Visit (HOSPITAL_COMMUNITY): Payer: Self-pay | Admitting: Psychiatry

## 2013-08-27 ENCOUNTER — Telehealth: Payer: Self-pay | Admitting: Internal Medicine

## 2013-08-27 NOTE — Telephone Encounter (Signed)
Error/ pt problem was a primary care issue, nothing further needed.Hillery Hunter

## 2013-08-28 ENCOUNTER — Encounter: Payer: Self-pay | Admitting: Internal Medicine

## 2013-08-29 ENCOUNTER — Encounter: Payer: Self-pay | Admitting: Cardiology

## 2013-08-29 MED ORDER — BISOPROLOL FUMARATE 5 MG PO TABS: 2.5000 mg | ORAL_TABLET | Freq: Every day | ORAL | Status: DC

## 2013-09-03 ENCOUNTER — Ambulatory Visit (INDEPENDENT_AMBULATORY_CARE_PROVIDER_SITE_OTHER): Payer: Medicare Other | Admitting: Psychiatry

## 2013-09-03 ENCOUNTER — Ambulatory Visit (INDEPENDENT_AMBULATORY_CARE_PROVIDER_SITE_OTHER): Payer: Medicare Other | Admitting: Psychology

## 2013-09-03 ENCOUNTER — Encounter (HOSPITAL_COMMUNITY): Payer: Self-pay | Admitting: Psychiatry

## 2013-09-03 VITALS — BP 142/91 | HR 70 | Ht 62.0 in | Wt 179.2 lb

## 2013-09-03 DIAGNOSIS — F331 Major depressive disorder, recurrent, moderate: Secondary | ICD-10-CM | POA: Diagnosis not present

## 2013-09-03 DIAGNOSIS — J309 Allergic rhinitis, unspecified: Secondary | ICD-10-CM | POA: Diagnosis not present

## 2013-09-03 DIAGNOSIS — F329 Major depressive disorder, single episode, unspecified: Secondary | ICD-10-CM

## 2013-09-03 MED ORDER — LORAZEPAM 0.5 MG PO TABS: ORAL_TABLET | ORAL | Status: DC

## 2013-09-03 MED ORDER — ESCITALOPRAM OXALATE 10 MG PO TABS: 10.0000 mg | ORAL_TABLET | Freq: Every day | ORAL | Status: DC

## 2013-09-03 NOTE — Progress Notes (Signed)
[facility] [phone] Progress Note  [name] [id number] 80 y.o.  09/03/2013 1:39 PM  Chief Complaint:  Medication management and followup.    History presenting illness [name] came for her followup appointment.  She has been compliant with Lexapro and she is to take lorazepam sometime but insomnia and anxiety attacks.  She has been busy seeing multiple doctors for chronic health issues.  She has pain in her joints .  She seeing therapist every 2 weeks.  She admitted sometimes getting tired because she seeing multiple doctors but denies any suicidal thoughts or homicidal thoughts.  She denies any agitation or any anger.  She likes Lexapro.  She sleeping good.  She denied any feeling of hopelessness or worthlessness.  She denies any paranoia or any hallucination.  She do not recall any recent crying spells.  She has no tremors or shakes.  She's not drinking or using any illegal substance.  Suicidal Ideation: No Plan Formed: No Patient has means to carry out plan: No  Homicidal Ideation: No Plan Formed: No Patient has means to carry out plan: No  Review of Systems: Psychiatric: Agitation: No Hallucination: No Depressed Mood: No Insomnia: No Hypersomnia: No Altered Concentration: No Feels Worthless: No Grandiose Ideas: No Belief In Special Powers: No New/Increased Substance Abuse: No Compulsions: No  Neurologic: Headache: Yes Seizure: No Paresthesias: No Review of Systems  Musculoskeletal: Positive for joint pain.   Medical history Patient has history of hypothyroidism, vitamin D deficiency, hyperlipidemia, hypertension, allergic rhinitis, GERD, and a fissure, cystitis, chronic diarrhea, afibrillation, osteoporosis, chronic fatigue, history of breast cancer, hearing loss, irritable bladder, chronic knee pain and dysphagia.  Her primary care physician is Dr. Richardson Landry.  Outpatient Encounter Prescriptions as of 09/03/2013  Medication Sig  . allopurinol (ZYLOPRIM)  300 MG tablet TAKE ONE TABLET BY MOUTH EVERY DAY as needed  . bisoprolol (ZEBETA) 5 MG tablet Take 0.5 tablets (2.5 mg total) by mouth daily.  . Calcium Carbonate-Vit D-Min (CALTRATE 600+D PLUS PO) Take 1 tablet by mouth daily.  . Cetirizine HCl (ZYRTEC ALLERGY) 10 MG CAPS Take 1 capsule by mouth daily as needed.   . Cholecalciferol (VITAMIN D3) 2000 UNITS TABS Take 1 tablet by mouth daily.  [name] EPIPEN 2-PAK 0.3 MG/0.3ML DEVI Inject 1 Device into the muscle daily as needed. For allergic reaction  . escitalopram (LEXAPRO) 10 MG tablet Take 1 tablet (10 mg total) by mouth daily.  . Garlic 732 MG TABS Take 1 tablet by mouth daily.  [name] levothyroxine (SYNTHROID, LEVOTHROID) 100 MCG tablet TAKE ONE TABLET BY MOUTH ONCE DAILY  . LORazepam (ATIVAN) 0.5 MG tablet TAKE ONE TABLET BY MOUTH ONCE DAILY AS NEEDED FOR ANXIETY  . magnesium oxide (MAG-OX) 400 MG tablet Take 400 mg by mouth daily.  [name] oxybutynin (DITROPAN) 5 MG tablet TAKE ONE TABLET BY MOUTH THREE TIMES DAILY  . Psyllium (METAMUCIL PO) Once daily  . RABEprazole (ACIPHEX) 20 MG tablet Take 1 tablet by mouth 2 (two) times daily before a meal.  . spironolactone (ALDACTONE) 25 MG tablet Take 25 mg by mouth every Monday, Wednesday, and Friday.  . [DISCONTINUED] escitalopram (LEXAPRO) 10 MG tablet Take 1 tablet (10 mg total) by mouth daily.  . [DISCONTINUED] LORazepam (ATIVAN) 0.5 MG tablet TAKE ONE TABLET BY MOUTH ONCE DAILY AS NEEDED FOR ANXIETY    Past Psychiatric History/Hospitalization(s): Anxiety: Yes Bipolar Disorder: No Depression: Yes Mania: No Psychosis: No Schizophrenia: No Personality Disorder: No Hospitalization for psychiatric illness: Yes History of Electroconvulsive Shock Therapy:  No Prior Suicide Attempts: No  Physical Exam: Constitutional:  BP 142/91  Pulse 70  Ht 5\' 2"  (1.575 m)  Wt 179 lb 3.2 oz (81.285 kg)  BMI 32.77 kg/m2  Mental status examination Patient is casually dressed and fairly groomed.  She is pleasant  and cooperative.  Her speech is clear.  She denies any active or passive suicidal thoughts or homicidal thoughts.  There were no paranoia or delusions present at this time.  She describes her mood as good and her affect is mood appropriate.  Her attention concentration is okay.  She is alert and oriented x3.  Her insight judgment and impulse control is okay.  Established Problem, Stable/Improving (1), Review of Last Therapy Session (1) and Review of Medication Regimen & Side Effects (2)  Assessment: Axis I: Maj. depressive disorder  Axis II: Deferred  Axis III: See medical history  Axis IV: Mild to moderate  Axis V: 60-65   Plan:  Reassurance given.  Patient is not interested to increase the dosage of Lexapro.  I will continue Lexapro 10 mg daily and Ativan 0.5 mg at bedtime.  Recommended continued therapy.  Recommend to call us back if she has any question or concern.  I will see her again in 3 months.  Volanda Mangine T., MD 09/03/2013

## 2013-09-06 DIAGNOSIS — M79609 Pain in unspecified limb: Secondary | ICD-10-CM | POA: Diagnosis not present

## 2013-09-06 DIAGNOSIS — M948X9 Other specified disorders of cartilage, unspecified sites: Secondary | ICD-10-CM | POA: Diagnosis not present

## 2013-09-06 DIAGNOSIS — M65979 Unspecified synovitis and tenosynovitis, unspecified ankle and foot: Secondary | ICD-10-CM | POA: Diagnosis not present

## 2013-09-06 DIAGNOSIS — M2010 Hallux valgus (acquired), unspecified foot: Secondary | ICD-10-CM | POA: Diagnosis not present

## 2013-09-06 DIAGNOSIS — B351 Tinea unguium: Secondary | ICD-10-CM | POA: Diagnosis not present

## 2013-09-06 DIAGNOSIS — M659 Synovitis and tenosynovitis, unspecified: Secondary | ICD-10-CM | POA: Diagnosis not present

## 2013-09-06 DIAGNOSIS — M25579 Pain in unspecified ankle and joints of unspecified foot: Secondary | ICD-10-CM | POA: Diagnosis not present

## 2013-09-06 DIAGNOSIS — E119 Type 2 diabetes mellitus without complications: Secondary | ICD-10-CM | POA: Diagnosis not present

## 2013-09-12 ENCOUNTER — Telehealth: Payer: Self-pay | Admitting: Internal Medicine

## 2013-09-12 NOTE — Telephone Encounter (Signed)
Patient Information:  Caller Name: Corena  Phone: [phone]  Patient: [name], [name]  Gender: Female  DOB: 11-19-1929  Age: 80 Years  PCP: Plotnikov, Alex (Adults only)  Office Follow Up:  Does the office need to follow up with this patient?: Yes  Instructions For The Office: She is needing referral to another GI Specialist since the one that she sees is out of office until June. She does not want to go to [facility] since does not have a good repore with MD's there.   Symptoms  Reason For Call & Symptoms: Calling about having rectal bleeding with wiping- both with urinating and passing stool;- onset 09/12/13. She is not constipated. Hx of IBS and has had 2 loose normal color stools today. She has a bump near rectum and bottom is sore.  Her Gastroenterologist out of office until June and is wanting referral to another GI Specialist but not with [facility].  Reviewed Health History In EMR: Yes  Reviewed Medications In EMR: Yes  Reviewed Allergies In EMR: Yes  Reviewed Surgeries / Procedures: Yes  Date of Onset of Symptoms: 09/12/2013  Guideline(s) Used:  Rectal Symptoms  Rectal Bleeding  Disposition Per Guideline:   See Today in Office  Reason For Disposition Reached:   Blood passed alone without any stool  Advice Given:  Treatment of Acute Rectal Pain due to Fecal Impaction:   SITZ Bath - Take a 20-minute bath in warm water. Add 2 oz (57 grams) of baking soda to the bath water. This is also called a "Sitz bath" and it often helps relax the anal sphincter and release the bowel movement (BM).  Suppository - If the sitz bath does not work, try 1 or 2 glycerin rectal suppositories, which you can get over-the-counter (OTC) at your local pharmacy.  Treatment of Mild Rectal Pain:  Rectal pain and irritation can often be caused by either hemorrhoids or a tiny tear in the rectal opening (anal fissure). Small drops of blood can sometimes be seen on the toilet  paper or stool in people with hemorrhoids or rectal irritation from hard bowel movements.  Warm SITZ Bath Twice a Day - Sit in a warm saline bath for 20 minutes bid to cleanse the area and to promote healing. Add 2 ounces (57 grams) of table salt or baking soda to each tub of water. Afterwards, gently pat area dry with unscented toilet paper.  Call Back If:  Severe rectal pain or itching  Acute rectal pain due to fecal impaction is not completely relieved after treatment  Rectal pain or itching lasts over 3 days  Rectal bleeding (i.e., more than just a few drops on toilet paper from wiping)  You become worse.  Call Back If:  Bleeding increases in amount  Bleeding occurs 3 or more times after treatment begins  You become worse.  Patient Will Follow Care Advice:  YES  Appointment Scheduled:  09/13/2013 09:15:00 Appointment Scheduled Provider:  Walker Kehr (Adults only)

## 2013-09-13 ENCOUNTER — Encounter: Payer: Self-pay | Admitting: Internal Medicine

## 2013-09-13 ENCOUNTER — Ambulatory Visit: Payer: Self-pay | Admitting: Internal Medicine

## 2013-09-18 DIAGNOSIS — K625 Hemorrhage of anus and rectum: Secondary | ICD-10-CM | POA: Diagnosis not present

## 2013-09-18 DIAGNOSIS — K219 Gastro-esophageal reflux disease without esophagitis: Secondary | ICD-10-CM | POA: Diagnosis not present

## 2013-09-18 DIAGNOSIS — K6289 Other specified diseases of anus and rectum: Secondary | ICD-10-CM | POA: Diagnosis not present

## 2013-09-19 DIAGNOSIS — M2010 Hallux valgus (acquired), unspecified foot: Secondary | ICD-10-CM | POA: Diagnosis not present

## 2013-09-19 DIAGNOSIS — M659 Synovitis and tenosynovitis, unspecified: Secondary | ICD-10-CM | POA: Diagnosis not present

## 2013-09-20 ENCOUNTER — Ambulatory Visit (INDEPENDENT_AMBULATORY_CARE_PROVIDER_SITE_OTHER): Payer: Medicare Other | Admitting: Internal Medicine

## 2013-09-20 ENCOUNTER — Encounter: Payer: Self-pay | Admitting: Internal Medicine

## 2013-09-20 VITALS — BP 110/70 | HR 65 | Temp 97.1°F | Ht 64.0 in | Wt 177.0 lb

## 2013-09-20 DIAGNOSIS — R0989 Other specified symptoms and signs involving the circulatory and respiratory systems: Secondary | ICD-10-CM

## 2013-09-20 DIAGNOSIS — R0609 Other forms of dyspnea: Secondary | ICD-10-CM | POA: Diagnosis not present

## 2013-09-20 DIAGNOSIS — R0600 Dyspnea, unspecified: Secondary | ICD-10-CM

## 2013-09-20 NOTE — Patient Instructions (Addendum)
Change spironolactone to where you only take it as needed for swelling as per med calendar  Pace yourself slower when walking and you should be able to go twice as far without having to stop  Pulmonary follow up is as needed

## 2013-09-20 NOTE — Progress Notes (Signed)
Subjective:    Patient ID: [name], female    DOB: [birth year]  MRN: [id number]   Brief patient profile:  69 yobf never smoker with h/o sneezing/itching/coughing  eval 1979 and placed on allergy shots ever since but nose runs year round  if doesn't  take zyrtec developed new unexplained doe 2014 self referred to pulmonary clinic 06/20/2013 with nl  pfts 07/02/13     History of Present Illness  06/20/2013 1st [facility] office visit/ Wert cc progressive doe x one year (chart suggests dates back to 2012 however) indolent onset to point where can't do steps at [place], can't finish silver sneakers, can't walk a mile, can do grocery shopping but has to rest before going across the parking lot.  Hb during this time worse than usual on aciphex ac and pc rec Stop visken and start bystolic 5 mg one twice daily  Continue aciphex 20 mg Take 30- 60 min before your first and last meals of the day    07/02/2013 f/u ov/Wert re: unexplained sob Chief Complaint  Patient presents with  . Follow-up    Pt reports her breathing is unchanged since her last visit. She c/o HA since she started taking bystolic.   ha is random, transient, not related to time of day or activity, generalized, s nausea of neuro/viz cc's rec Finish your cardiac evaluation first, then if not doing better call [phone] for CPST with spirometry before and after > neg myoview, was not physically stressed at any point Zebeta 5 mg reduce to  one half tablet daily  Take only the medications on your list as they are on your after visit summary  If heart burn acts up, ok to resume aciphex 20 mg  > it did flare so she continue aciphe bid    08/07/2013 f/u ov/Wert re: unexplained doe, takes various alternative rx/ "nothing worksAdvertising account planner Complaint  Patient presents with  . Follow-up    Breathing is unchanged since last visit. She states that Cardiology ruled out any heart problems and advised her to f/u here.    no better or  worse doe since onset, never occurs at rest or sleeping, still can't do a grocery store s stopping to rest  rec Continue on current regimen   Follow med calendar closely and bring to each visit.   08/23/2013 followup and medication review. Patient returns for a followup visit and medication review. Reviewed all her medications and organized them into a medication calendar. Patient education. Appears the patient is taking her medication currently. Says her dyspnea is unchanged since last ov; no new complaints. rec Continue on current regimen  Labs today.  Follow med calendar closely and bring to each visit   09/20/2013 f/u ov/Wert re: sob x much worse since  first of the year/ no med calendar Chief Complaint  Patient presents with  . Shortness of Breath    Breathing is unchanged. Still reports DOE. Denies chest tightness or coughing.  no resting or noct symptoms - doe not really worse since onset   No obvious day to day or daytime variabilty or assoc chronic cough or cp or chest tightness, subjective wheeze overt sinus or hb symptoms. No unusual exp hx or h/o childhood pna/ asthma or knowledge of premature birth.  Sleeping ok without nocturnal  or early am exacerbation  of respiratory  c/o's or need for noct saba. Also denies any obvious fluctuation of symptoms with weather or environmental changes or other aggravating  or alleviating factors except as outlined above   Current Medications, Allergies, Complete Past Medical History, Past Surgical History, Family History, and Social History were reviewed in [employer] record.  ROS  The following are not active complaints unless bolded sore throat, dysphagia, dental problems, itching, sneezing,  nasal congestion or excess/ purulent secretions, ear ache,   fever, chills, sweats, unintended wt loss, pleuritic or exertional cp, hemoptysis,  orthopnea pnd or leg swelling, presyncope, palpitations, heartburn, abdominal pain,  anorexia, nausea, vomiting, diarrhea  or change in bowel or urinary habits, change in stools or urine, dysuria,hematuria,  rash, arthralgias, visual complaints, headache, numbness weakness or ataxia or problems with walking or coordination,  change in mood/affect or memory.               Objective:   Physical Exam  07/02/2013         171 > 08/07/13  180 > 177   09/20/2013      amb bf with  Hopeless helpless affect  HEENT: nl dentition, turbinates, and orophanx. Nl external ear canals without cough reflex   NECK :  without JVD/Nodes/TM/ nl carotid upstrokes bilaterally   LUNGS: no acc muscle use, clear to A and P bilaterally without cough on insp or exp maneuvers   CV:  RRR  no s3 or murmur or increase in P2, no edema   ABD:  soft and nontender with nl excursion in the supine position. No bruits or organomegaly, bowel sounds nl  MS:  warm without deformities, calf tenderness, cyanosis or clubbing  SKIN: warm and dry without lesions         CXR  07/02/2013 : 1. Postoperative changes. 2. Mild cardiomegaly. No edema. 3. No focal acute pulmonary abnormality.      Lab Results  Component Value Date   TSH 0.50 08/23/2013    Assessment & Plan:

## 2013-09-21 NOTE — Assessment & Plan Note (Addendum)
Chronic - poss multifactorial - 03/2011 CT angio was nl  - ECHO done on  06/06/13  With bnp 103 (05/29/13) - Left ventricle: The cavity size was normal. There was moderate concentric hypertrophy. Systolic function was Vigorous. . Doppler parameters are consistent with abnormal left ventricular relaxation (grade 1 diastolic dysfunction).  - Pulmonary arteries: Systolic pressure was mildly increased. PA peak pressure: 35mm Hg -  06/20/2013  Walked RA  2 laps @ 185 ft each stopped due to  Sob no desat - 08/07/2013  Walked RA x 2 laps @ 185 ft each stopped due to  Sob with peak HR only 66 and sats ok - 09/20/2013  Walked RA x 3 laps @ 185 ft each stopped due to  End of study, no desat, peak HR 71 on zebeta 2.5 mg daily  -  07/02/13 PFT's nl with dlco that corrects to nl  -08/23/2013 Med calendar> not using 09/20/13    I had an extended summary discussion with the patient today lasting 15 to 20 minutes of a 25 minute visit on the following issues:   According to the EMR her problem with doe dates back years, not months, and we have not been able to reproduce anything but a relatively poor HR to ex which is BB related and I suggested she try to wean this down if not off but she was reluctant "because I'm doing so well on it"  Pulmonary f/u can therefore be prn

## 2013-10-01 ENCOUNTER — Ambulatory Visit (INDEPENDENT_AMBULATORY_CARE_PROVIDER_SITE_OTHER): Payer: Medicare Other | Admitting: Psychology

## 2013-10-01 DIAGNOSIS — F331 Major depressive disorder, recurrent, moderate: Secondary | ICD-10-CM | POA: Diagnosis not present

## 2013-10-02 ENCOUNTER — Ambulatory Visit (INDEPENDENT_AMBULATORY_CARE_PROVIDER_SITE_OTHER): Payer: Medicare Other | Admitting: Physician Assistant

## 2013-10-02 ENCOUNTER — Encounter: Payer: Self-pay | Admitting: Physician Assistant

## 2013-10-02 VITALS — BP 132/80 | HR 63 | Ht 64.0 in | Wt 177.0 lb

## 2013-10-02 DIAGNOSIS — Z950 Presence of cardiac pacemaker: Secondary | ICD-10-CM | POA: Diagnosis not present

## 2013-10-02 DIAGNOSIS — J309 Allergic rhinitis, unspecified: Secondary | ICD-10-CM | POA: Diagnosis not present

## 2013-10-02 DIAGNOSIS — R0602 Shortness of breath: Secondary | ICD-10-CM

## 2013-10-02 DIAGNOSIS — I10 Essential (primary) hypertension: Secondary | ICD-10-CM | POA: Diagnosis not present

## 2013-10-02 NOTE — Patient Instructions (Signed)
FOLLOW UP WITH DR. Caryl Comes IN 04/2014; WE WILL SEND OUT A REMINDER LETTER TO MAKE AN APPT.  NO CHANGES WERE MADE TODAY

## 2013-10-02 NOTE — Progress Notes (Signed)
Cardiology Office Note    Date:  10/02/2013   ID:  [name], DOB 1929/09/15, MRN [id number]  PCP:  Walker Kehr, MD  Cardiologist:  Dr. Virl Axe     History of Present Illness: [name] is a 80 y.o. female with a hx of orthostatic intolerance, exercise intolerance, sinus node dysfunction status post pacemaker implantation, HTN, breast CA. Echo in 03/2011 demonstrated normal LVF and mild diastolic dysfunction.  ETT-Echo in 2/13 was non-dx due to sub-optimal exercise.  Last seen by Dr. Caryl Comes 04/2013.  Interrogation of pacemaker demonstrated less than 1% of atrial fibrillation.    I saw her in 05/2013 for evaluation of dyspnea.  Echocardiogram demonstrated normal LV function and mild diastolic dysfunction.  Her BNP was mildly elevated. I suggested a trial of low-dose Lasix 3 days a week. She opted to increase her spironolactone to every day.  She was seen in f/u by pulmonary (Dr. Melvyn Novas).  She had no desat with ambulation (after eliciting symptoms).   She was changed from pindolol to Bystolic.  PFTs in 06/2013 were normal.  Bystolic was changed to Bisoprolol.  This dose was reduced due to low HR with ambulation at the pulmonology clinic.  CPST is being considered.  I saw her in 06/2013.  Lexiscan Myoview was obtained and this demonstrated diaphragmatic attenuation, no ischemia and EF 61% (Low Risk).  Last seen by Dr. Melvyn Novas 5/28.  No further workup is planned.  He suggested weaning off of her beta blocker but she declined.    She returns for follow up.  When last seen by Dr. Melvyn Novas, he asked her to pace herself more (she was walking too fast).  She feels better doing this.  Her breathing is better with this strategy.  She denies chest pain, syncope, orthopnea, PND, edema.  She is now taking Spironolactone prn.    Recent Labs: 01/01/2013: ALT 12  05/29/2013: Hemoglobin 13.5; Pro B Natriuretic peptide (BNP) 103.0*  07/30/2013: Creatinine 1.1; Potassium 4.7  08/23/2013: TSH 0.50   Wt Readings from  Last 3 Encounters:  10/02/13 177 lb (80.287 kg)  09/20/13 177 lb (80.287 kg)  09/03/13 179 lb 3.2 oz (81.285 kg)     Past Medical History  Diagnosis Date  . Thyroid disease   . Glaucoma   . IBS (irritable bowel syndrome)   . Incontinence   . GERD (gastroesophageal reflux disease)   . Hypertension   . Anxiety   . Depression     Dr Cheryln Manly  . HX: breast cancer   . Osteoarthritis   . Osteopenia   . Bradycardia   . IBS (irritable bowel syndrome)   . Discoid lupus     skin  . Pacemaker     Brady//Chronotropic incompetence with normal pacemaker function  . Full dentures   . Wears hearing aid     both ears  . Hx of echocardiogram     a.  Echocardiogram (03/2011): Mild LVH, EF 14-97%, grade 1 diastolic dysfunction, mild MR, mild to moderate TR, PASP 42;  b. Echo (05/2013):  Mod LVH, EF 65-70%, no WMA, Gr 1 DD, mild MR, mod TR, PASP 39  . Hx of exercise stress test     a. ETT-echocardiogram (06/08/11): Sub-optimal exercise. Test stopped early due to dizziness and hypotension. EF 60%.  Non-diagnostic.;   b. Lexiscan Myoview (06/2013):  Diaphragmatic attenuation, no ischemia, EF 61%.  Low Risk  . Pacemaker     Current Outpatient Prescriptions  Medication Sig Dispense Refill  .  allopurinol (ZYLOPRIM) 300 MG tablet TAKE ONE TABLET BY MOUTH EVERY DAY as needed      . bisoprolol (ZEBETA) 5 MG tablet Take 0.5 tablets (2.5 mg total) by mouth daily.  30 tablet  11  . Calcium Carbonate-Vit D-Min (CALTRATE 600+D PLUS PO) Take 1 tablet by mouth daily.      . Cetirizine HCl (ZYRTEC ALLERGY) 10 MG CAPS Take 1 capsule by mouth daily as needed.       . Cholecalciferol (VITAMIN D3) 2000 UNITS TABS Take 1 tablet by mouth as needed.       [name] EPIPEN 2-PAK 0.3 MG/0.3ML DEVI Inject 1 Device into the muscle daily as needed. For allergic reaction      . escitalopram (LEXAPRO) 10 MG tablet Take 1 tablet (10 mg total) by mouth daily.  90 tablet  0  . Garlic 315 MG TABS Take 1 tablet by mouth daily.      [name]  levothyroxine (SYNTHROID, LEVOTHROID) 100 MCG tablet TAKE ONE TABLET BY MOUTH ONCE DAILY  90 tablet  1  . LORazepam (ATIVAN) 0.5 MG tablet TAKE ONE TABLET BY MOUTH ONCE DAILY AS NEEDED FOR ANXIETY  30 tablet  2  . magnesium oxide (MAG-OX) 400 MG tablet Take 400 mg by mouth daily.      [name] oxybutynin (DITROPAN) 5 MG tablet TAKE ONE TABLET BY MOUTH THREE TIMES DAILY  90 tablet  5  . Psyllium (METAMUCIL PO) Once daily      . RABEprazole (ACIPHEX) 20 MG tablet Take 1 tablet by mouth 2 (two) times daily before a meal.      . spironolactone (ALDACTONE) 25 MG tablet Take 25 mg by mouth every Monday, Wednesday, and Friday. PRN       No current facility-administered medications for this visit.    Allergies:   Amlodipine besy-benazepril hcl; Amlodipine besylate; Clonidine hydrochloride; Lactose intolerance (gi); Valsartan; and Verapamil   Social History:  The patient  reports that she has never smoked. She has never used smokeless tobacco. She reports that she does not drink alcohol or use illicit drugs.   Family History:  The patient's family history includes Cancer in her brother; Heart disease in her brother and father; Ovarian cancer in her mother; Stroke in her brother and sister.   ROS:  Please see the history of present illness.     All other systems reviewed and negative.   PHYSICAL EXAM: VS:  BP 132/80  Pulse 63  Ht 5\' 4"  (1.626 m)  Wt 177 lb (80.287 kg)  BMI 30.37 kg/m2 Well nourished, well developed, in no acute distress HEENT: normal Neck: no JVD Cardiac:  normal S1, S2; RRR; no murmur Lungs:  clear to auscultation bilaterally, no wheezing, rhonchi or rales Abd: soft, nontender, no hepatomegaly Ext: no edema Skin: warm and dry Neuro:  CNs 2-12 intact, no focal abnormalities noted  EKG:  A paced, HR 63, no changes     ASSESSMENT AND PLAN:  1. Dyspnea:  Overall, I suspect this is related to deconditioning.  Continue with current strategy. Continue current  management. 2. Hypertension:  Controlled.  3. Status Post Pacemaker:  Follow up with EP as planned. 4. Disposition: Follow up with Dr. Virl Axe in 04/2014.  Signed, Versie Starks, MHS 10/02/2013 10:42 AM    [facility] [address], [place], [place]  [address] Phone: [phone]; Fax: [phone]

## 2013-10-10 DIAGNOSIS — J309 Allergic rhinitis, unspecified: Secondary | ICD-10-CM | POA: Diagnosis not present

## 2013-10-15 ENCOUNTER — Ambulatory Visit (INDEPENDENT_AMBULATORY_CARE_PROVIDER_SITE_OTHER): Payer: Medicare Other | Admitting: Psychology

## 2013-10-15 DIAGNOSIS — F331 Major depressive disorder, recurrent, moderate: Secondary | ICD-10-CM

## 2013-10-17 DIAGNOSIS — L930 Discoid lupus erythematosus: Secondary | ICD-10-CM | POA: Diagnosis not present

## 2013-10-22 DIAGNOSIS — M1710 Unilateral primary osteoarthritis, unspecified knee: Secondary | ICD-10-CM | POA: Diagnosis not present

## 2013-10-29 ENCOUNTER — Ambulatory Visit: Payer: Medicare Other | Admitting: Psychology

## 2013-10-29 DIAGNOSIS — H4011X0 Primary open-angle glaucoma, stage unspecified: Secondary | ICD-10-CM | POA: Diagnosis not present

## 2013-10-29 DIAGNOSIS — H47239 Glaucomatous optic atrophy, unspecified eye: Secondary | ICD-10-CM | POA: Diagnosis not present

## 2013-10-30 DIAGNOSIS — J309 Allergic rhinitis, unspecified: Secondary | ICD-10-CM | POA: Diagnosis not present

## 2013-11-05 DIAGNOSIS — J309 Allergic rhinitis, unspecified: Secondary | ICD-10-CM | POA: Diagnosis not present

## 2013-11-09 DIAGNOSIS — J309 Allergic rhinitis, unspecified: Secondary | ICD-10-CM | POA: Diagnosis not present

## 2013-11-12 ENCOUNTER — Ambulatory Visit (INDEPENDENT_AMBULATORY_CARE_PROVIDER_SITE_OTHER): Payer: Medicare Other | Admitting: *Deleted

## 2013-11-12 ENCOUNTER — Ambulatory Visit (INDEPENDENT_AMBULATORY_CARE_PROVIDER_SITE_OTHER): Payer: Medicare Other | Admitting: Psychology

## 2013-11-12 ENCOUNTER — Encounter: Payer: Self-pay | Admitting: Internal Medicine

## 2013-11-12 DIAGNOSIS — F331 Major depressive disorder, recurrent, moderate: Secondary | ICD-10-CM

## 2013-11-12 DIAGNOSIS — I495 Sick sinus syndrome: Secondary | ICD-10-CM | POA: Diagnosis not present

## 2013-11-12 LAB — MDC_IDC_ENUM_SESS_TYPE_REMOTE
Battery Impedance: 1267 Ohm
Battery Remaining Longevity: 50 mo
Battery Voltage: 2.76 V
Date Time Interrogation Session: 20150720121840
Lead Channel Pacing Threshold Amplitude: 0.5 V
Lead Channel Pacing Threshold Pulse Width: 0.4 ms
Lead Channel Pacing Threshold Pulse Width: 0.4 ms
Lead Channel Setting Pacing Amplitude: 2.5 V
Lead Channel Setting Pacing Pulse Width: 0.4 ms
Lead Channel Setting Sensing Sensitivity: 2.8 mV
MDC IDC MSMT LEADCHNL RA IMPEDANCE VALUE: 517 Ohm
MDC IDC MSMT LEADCHNL RA PACING THRESHOLD AMPLITUDE: 0.75 V
MDC IDC MSMT LEADCHNL RV IMPEDANCE VALUE: 550 Ohm
MDC IDC MSMT LEADCHNL RV SENSING INTR AMPL: 16 mV
MDC IDC SET LEADCHNL RA PACING AMPLITUDE: 2 V
MDC IDC STAT BRADY AP VP PERCENT: 0 %
MDC IDC STAT BRADY AP VS PERCENT: 82 %
MDC IDC STAT BRADY AS VP PERCENT: 1 %
MDC IDC STAT BRADY AS VS PERCENT: 17 %

## 2013-11-12 NOTE — Progress Notes (Signed)
Remote pacemaker transmission.   

## 2013-11-13 DIAGNOSIS — J309 Allergic rhinitis, unspecified: Secondary | ICD-10-CM | POA: Diagnosis not present

## 2013-11-13 NOTE — Telephone Encounter (Signed)
Called pt and confirmed with pt that we did receive her transmission. Pt verbalized understanding.

## 2013-11-20 DIAGNOSIS — J309 Allergic rhinitis, unspecified: Secondary | ICD-10-CM | POA: Diagnosis not present

## 2013-11-26 ENCOUNTER — Other Ambulatory Visit: Payer: Self-pay | Admitting: Internal Medicine

## 2013-11-26 DIAGNOSIS — M25579 Pain in unspecified ankle and joints of unspecified foot: Secondary | ICD-10-CM | POA: Diagnosis not present

## 2013-11-26 DIAGNOSIS — J309 Allergic rhinitis, unspecified: Secondary | ICD-10-CM | POA: Diagnosis not present

## 2013-11-29 ENCOUNTER — Ambulatory Visit (INDEPENDENT_AMBULATORY_CARE_PROVIDER_SITE_OTHER): Payer: Medicare Other | Admitting: Psychology

## 2013-11-29 DIAGNOSIS — F331 Major depressive disorder, recurrent, moderate: Secondary | ICD-10-CM | POA: Diagnosis not present

## 2013-12-03 DIAGNOSIS — J309 Allergic rhinitis, unspecified: Secondary | ICD-10-CM | POA: Diagnosis not present

## 2013-12-04 ENCOUNTER — Ambulatory Visit (INDEPENDENT_AMBULATORY_CARE_PROVIDER_SITE_OTHER): Payer: Medicare Other | Admitting: Psychiatry

## 2013-12-04 ENCOUNTER — Encounter (HOSPITAL_COMMUNITY): Payer: Self-pay | Admitting: Psychiatry

## 2013-12-04 VITALS — BP 128/84 | HR 81 | Wt 178.0 lb

## 2013-12-04 DIAGNOSIS — F329 Major depressive disorder, single episode, unspecified: Secondary | ICD-10-CM | POA: Diagnosis not present

## 2013-12-04 MED ORDER — LORAZEPAM 0.5 MG PO TABS: ORAL_TABLET | ORAL | Status: DC

## 2013-12-04 NOTE — Progress Notes (Signed)
[facility] [phone] Progress Note  [name] [id number] 80 y.o.  12/04/2013 3:04 PM  Chief Complaint:   I want to come off from Lexapro.     History presenting illness [name] came for her followup appointment.   She wanted to come out from Lexapro.  She bring pharmacy leaflet about Lexapro and she is concerned about the side effects.  Patient feels that her IBS symptoms are caused by Lexapro.  She is taking Lexapro for a long time.  She developed a lot of anxiety because of IBS .  When I reminded her that in the past we have stopped antidepressant and she got more depressed patient continued to insist that she may not need Lexapro at this time.  She is taking Ativan only as needed.  Patient has multiple health issues and she is taking multiple medication.  She wants to try reducing the Lexapro first and if it is okay she wants to come off.  She sleeping good.  She denies any major panic attack or anxiety attack.  She mentioned that she has no crying spells, irritability or any mood swings.  Her appetite is okay.  Her vitals are stable.  She has no tremors or shakes.  She does not drink or use any of the substances.  She lives by herself.    Suicidal Ideation: No Plan Formed: No Patient has means to carry out plan: No  Homicidal Ideation: No Plan Formed: No Patient has means to carry out plan: No  Review of Systems: Psychiatric: Agitation: No Hallucination: No Depressed Mood: No Insomnia: No Hypersomnia: No Altered Concentration: No Feels Worthless: No Grandiose Ideas: No Belief In Special Powers: No New/Increased Substance Abuse: No Compulsions: No  Neurologic: Headache: Yes Seizure: No Paresthesias: No Review of Systems  Musculoskeletal: Positive for joint pain.   Medical history Patient has history of hypothyroidism, vitamin D deficiency, hyperlipidemia, hypertension, allergic rhinitis, GERD, and a fissure, cystitis, chronic diarrhea, afibrillation,  osteoporosis, chronic fatigue, history of breast cancer, hearing loss, irritable bladder, chronic knee pain and dysphagia.  Her primary care physician is Dr. Richardson Landry.  Outpatient Encounter Prescriptions as of 12/04/2013  Medication Sig  . allopurinol (ZYLOPRIM) 300 MG tablet TAKE ONE TABLET BY MOUTH EVERY DAY as needed  . allopurinol (ZYLOPRIM) 300 MG tablet TAKE ONE TABLET BY MOUTH ONCE DAILY  . bisoprolol (ZEBETA) 5 MG tablet Take 0.5 tablets (2.5 mg total) by mouth daily.  . Calcium Carbonate-Vit D-Min (CALTRATE 600+D PLUS PO) Take 1 tablet by mouth daily.  . Cetirizine HCl (ZYRTEC ALLERGY) 10 MG CAPS Take 1 capsule by mouth daily as needed.   . Cholecalciferol (VITAMIN D3) 2000 UNITS TABS Take 1 tablet by mouth as needed.   [name] EPIPEN 2-PAK 0.3 MG/0.3ML DEVI Inject 1 Device into the muscle daily as needed. For allergic reaction  . escitalopram (LEXAPRO) 10 MG tablet Take 1 tablet (10 mg total) by mouth daily.  . Garlic 846 MG TABS Take 1 tablet by mouth daily.  [name] levothyroxine (SYNTHROID, LEVOTHROID) 100 MCG tablet TAKE ONE TABLET BY MOUTH ONCE DAILY  . LORazepam (ATIVAN) 0.5 MG tablet TAKE ONE TABLET BY MOUTH ONCE DAILY AS NEEDED FOR ANXIETY  . magnesium oxide (MAG-OX) 400 MG tablet Take 400 mg by mouth daily.  [name] oxybutynin (DITROPAN) 5 MG tablet TAKE ONE TABLET BY MOUTH THREE TIMES DAILY  . Psyllium (METAMUCIL PO) Once daily  . RABEprazole (ACIPHEX) 20 MG tablet Take 1 tablet by mouth 2 (two) times daily before a  meal.  . spironolactone (ALDACTONE) 25 MG tablet Take 25 mg by mouth every Monday, Wednesday, and Friday. PRN  . [DISCONTINUED] LORazepam (ATIVAN) 0.5 MG tablet TAKE ONE TABLET BY MOUTH ONCE DAILY AS NEEDED FOR ANXIETY    Past Psychiatric History/Hospitalization(s): Anxiety: Yes Bipolar Disorder: No Depression: Yes Mania: No Psychosis: No Schizophrenia: No Personality Disorder: No Hospitalization for psychiatric illness: Yes History of Electroconvulsive Shock Therapy:  No Prior Suicide Attempts: No  Physical Exam: Constitutional:  BP 128/84  Pulse 81  Wt 178 lb (80.74 kg)  Mental status examination Patient is casually dressed and fairly groomed.  She is pleasant and cooperative.  Her speech is clear.  She denies any active or passive suicidal thoughts or homicidal thoughts.  There were no paranoia or delusions present at this time.  She describes her mood as good and her affect is mood appropriate.  Her attention concentration is okay.  She is alert and oriented x3.  Her insight judgment and impulse control is okay.  Established Problem, Stable/Improving (1), Review of Psycho-Social Stressors (1), Review and summation of old records (2), Review of Last Therapy Session (1), Review of Medication Regimen & Side Effects (2) and Review of New Medication or Change in Dosage (2)  Assessment: Axis I: Maj. depressive disorder  Axis II: Deferred  Axis III: See medical history  Axis IV: Mild to moderate  Axis V: 60-65   Plan:   I have a long discussion with the patient about need of antidepressant to manage her depressive symptoms.  In the past we try her taking her off from antidepressant and her depression got worse.  However patient insists that she wants to try low-dose Lexapro because she feels she may not needed.  She also believe her IBS symptoms be caused by Lexapro.  As per her request we will reduce Lexapro 5 mg.  I will see her again in 4 weeks.  I strongly recommended to call us back if she feels worsening of symptoms.  Continue Ativan as needed.  Discussed the trigger factors of relapse.  Recommended to see Dr. Octavia Bruckner for counseling.  Patient does not have any side effects including any tremors or shakes.  Time spent 25 minutes.  More than 50% of the time spent in psychoeducation, counseling and coordination of care.  Discuss safety plan that anytime having active suicidal thoughts or homicidal thoughts then patient need to call 911 or go to the  local emergency room.   Adina Puzzo T., MD 12/04/2013

## 2013-12-05 DIAGNOSIS — L930 Discoid lupus erythematosus: Secondary | ICD-10-CM | POA: Diagnosis not present

## 2013-12-05 DIAGNOSIS — L821 Other seborrheic keratosis: Secondary | ICD-10-CM | POA: Diagnosis not present

## 2013-12-11 DIAGNOSIS — J309 Allergic rhinitis, unspecified: Secondary | ICD-10-CM | POA: Diagnosis not present

## 2013-12-13 ENCOUNTER — Ambulatory Visit (INDEPENDENT_AMBULATORY_CARE_PROVIDER_SITE_OTHER): Payer: Medicare Other | Admitting: Psychology

## 2013-12-13 ENCOUNTER — Ambulatory Visit: Payer: Self-pay | Admitting: Internal Medicine

## 2013-12-13 DIAGNOSIS — F331 Major depressive disorder, recurrent, moderate: Secondary | ICD-10-CM | POA: Diagnosis not present

## 2013-12-18 ENCOUNTER — Other Ambulatory Visit (INDEPENDENT_AMBULATORY_CARE_PROVIDER_SITE_OTHER): Payer: Medicare Other

## 2013-12-18 ENCOUNTER — Encounter: Payer: Self-pay | Admitting: Cardiology

## 2013-12-18 ENCOUNTER — Encounter: Payer: Self-pay | Admitting: Internal Medicine

## 2013-12-18 ENCOUNTER — Ambulatory Visit (INDEPENDENT_AMBULATORY_CARE_PROVIDER_SITE_OTHER): Payer: Medicare Other | Admitting: Internal Medicine

## 2013-12-18 VITALS — BP 162/100 | HR 84 | Temp 98.4°F

## 2013-12-18 DIAGNOSIS — M545 Low back pain, unspecified: Secondary | ICD-10-CM

## 2013-12-18 DIAGNOSIS — E039 Hypothyroidism, unspecified: Secondary | ICD-10-CM | POA: Diagnosis not present

## 2013-12-18 DIAGNOSIS — R55 Syncope and collapse: Secondary | ICD-10-CM

## 2013-12-18 DIAGNOSIS — I10 Essential (primary) hypertension: Secondary | ICD-10-CM

## 2013-12-18 DIAGNOSIS — J309 Allergic rhinitis, unspecified: Secondary | ICD-10-CM | POA: Diagnosis not present

## 2013-12-18 LAB — URINALYSIS, ROUTINE W REFLEX MICROSCOPIC
BILIRUBIN URINE: NEGATIVE
KETONES UR: NEGATIVE
NITRITE: NEGATIVE
Specific Gravity, Urine: <=1.005 — AB (ref 1.000–1.030)
Total Protein, Urine: NEGATIVE
UROBILINOGEN UA: 0.2 (ref 0.0–1.0)
Urine Glucose: NEGATIVE
pH: 7.5 (ref 5.0–8.0)

## 2013-12-18 NOTE — Assessment & Plan Note (Signed)
Labs

## 2013-12-18 NOTE — Assessment & Plan Note (Signed)
Check BP at home

## 2013-12-18 NOTE — Assessment & Plan Note (Signed)
Relapse

## 2013-12-18 NOTE — Progress Notes (Signed)
Pre visit review using our clinic review tool, if applicable. No additional management support is needed unless otherwise documented below in the visit note. 

## 2013-12-18 NOTE — Progress Notes (Signed)
Subjective:     HPI     C/o not feeling well today - tired  F/u L knee post-op swelling  The patient is here to follow up on chronic IBS worse lately, GERD,  depression, anxiety, SOB symptoms controlled with medicines.  F/u  legs are fatigued. No LBP... F/u ongoing IBS sx's, stress - anal fissure. She saw Dr Earlean Shawl - endoscopy is planned. She is planning to be seen at [facility].    Wt Readings from Last 3 Encounters:  12/04/13 178 lb (80.74 kg)  10/02/13 177 lb (80.287 kg)  09/20/13 177 lb (80.287 kg)   BP Readings from Last 3 Encounters:  12/18/13 162/100  12/04/13 128/84  10/02/13 132/80      Past Medical History  Diagnosis Date  . Thyroid disease   . Glaucoma   . IBS (irritable bowel syndrome)   . Incontinence   . GERD (gastroesophageal reflux disease)   . Hypertension   . Anxiety   . Depression     Dr Cheryln Manly  . HX: breast cancer   . Osteoarthritis   . Osteopenia   . Bradycardia   . IBS (irritable bowel syndrome)   . Discoid lupus     skin  . Pacemaker     Brady//Chronotropic incompetence with normal pacemaker function  . Full dentures   . Wears hearing aid     both ears  . Hx of echocardiogram     a.  Echocardiogram (03/2011): Mild LVH, EF 93-57%, grade 1 diastolic dysfunction, mild MR, mild to moderate TR, PASP 42;  b. Echo (05/2013):  Mod LVH, EF 65-70%, no WMA, Gr 1 DD, mild MR, mod TR, PASP 39  . Hx of exercise stress test     a. ETT-echocardiogram (06/08/11): Sub-optimal exercise. Test stopped early due to dizziness and hypotension. EF 60%.  Non-diagnostic.;   b. Lexiscan Myoview (06/2013):  Diaphragmatic attenuation, no ischemia, EF 61%.  Low Risk  . Pacemaker    Past Surgical History  Procedure Laterality Date  . Pacemaker placement  2009  . Foot surgery  2004    Left-hammer toes  . Mastectomy  2002    bilat mastectomy-snbx  . Breast lumpectomy  1995    lt -axillary node dissectoin  . Dorsal compartment release  2000    left  . Abdominal  hysterectomy    . Eye surgery  2011    cataract-lt  . Knee arthroscopy Left 08/29/2012    Procedure: LEFT KNEE ARTHROSCOPY ;  Surgeon: Hessie Dibble, MD;  Location: [facility] [facility];  Service: Orthopedics;  Laterality: Left;  . Trigger finger release Right 07/31/2013    Procedure: EXCISE MASS RIGHT INDEX A-1 PLLLEY RELEASE A-1 RIGHT INDEX;  Surgeon: Cammie Sickle., MD;  Location: [facility];  Service: Orthopedics;  Laterality: Right;    reports that she has never smoked. She has never used smokeless tobacco. She reports that she does not drink alcohol or use illicit drugs. family history includes Cancer in her brother; Heart disease in her brother and father; Ovarian cancer in her mother; Stroke in her brother and sister. Allergies  Allergen Reactions  . Amlodipine Besy-Benazepril Hcl Swelling  . Amlodipine Besylate Swelling  . Clonidine Hydrochloride     REACTION: tired  . Lactose Intolerance (Gi) Other (See Comments)  . Valsartan   . Verapamil    Current Outpatient Prescriptions on File Prior to Visit  Medication Sig Dispense Refill  . allopurinol (ZYLOPRIM) 300  MG tablet TAKE ONE TABLET BY MOUTH EVERY DAY as needed      . bisoprolol (ZEBETA) 5 MG tablet Take 0.5 tablets (2.5 mg total) by mouth daily.  30 tablet  11  . Calcium Carbonate-Vit D-Min (CALTRATE 600+D PLUS PO) Take 1 tablet by mouth daily.      . Cetirizine HCl (ZYRTEC ALLERGY) 10 MG CAPS Take 1 capsule by mouth daily as needed.       . Cholecalciferol (VITAMIN D3) 2000 UNITS TABS Take 1 tablet by mouth as needed.       [name] EPIPEN 2-PAK 0.3 MG/0.3ML DEVI Inject 1 Device into the muscle daily as needed. For allergic reaction      . escitalopram (LEXAPRO) 10 MG tablet Take 1 tablet (10 mg total) by mouth daily.  90 tablet  0  . Garlic 601 MG TABS Take 1 tablet by mouth daily.      [name] levothyroxine (SYNTHROID, LEVOTHROID) 100 MCG tablet TAKE ONE TABLET BY MOUTH ONCE DAILY  90 tablet  1  . LORazepam  (ATIVAN) 0.5 MG tablet TAKE ONE TABLET BY MOUTH ONCE DAILY AS NEEDED FOR ANXIETY  30 tablet  0  . magnesium oxide (MAG-OX) 400 MG tablet Take 400 mg by mouth daily.      [name] oxybutynin (DITROPAN) 5 MG tablet TAKE ONE TABLET BY MOUTH THREE TIMES DAILY  90 tablet  5  . Psyllium (METAMUCIL PO) Once daily      . RABEprazole (ACIPHEX) 20 MG tablet Take 1 tablet by mouth 2 (two) times daily before a meal.      . spironolactone (ALDACTONE) 25 MG tablet Take 25 mg by mouth every Monday, Wednesday, and Friday. PRN       No current facility-administered medications on file prior to visit.   Review of Systems Review of Systems  Constitutional: Negative for diaphoresis and unexpected weight change.  HENT: Negative for drooling and tinnitus.   Eyes: Negative for photophobia and visual disturbance.  Respiratory: Negative for choking and stridor.   Gastrointestinal: Negative for vomiting and blood in stool.  Genitourinary: Negative for hematuria and decreased urine volume.  Musculoskeletal: Negative for gait problem.  Skin: Negative for color change and wound.  Neurological: Negative for tremors and numbness.  Psychiatric/Behavioral: Negative for decreased concentration. The patient is not hyperactive.   Knee pain    Objective:   Physical Exam BP 162/100  Pulse 84  Temp(Src) 98.4 F (36.9 C) (Oral)  SpO2 99% Physical Exam  VS noted Constitutional: Pt appears well-developed and well-nourished.  HENT: Head: Normocephalic.  Right Ear: External ear normal.  Left Ear: External ear normal.  Eyes: Conjunctivae and EOM are normal. Pupils are equal, round, and reactive to light.  Neck: Normal range of motion. Neck supple.  Cardiovascular: Normal rate and regular rhythm.   Pulmonary/Chest: Effort normal and breath sounds normal. no rales or wheezing Abd:  Soft, NT, non-distended, + BS Neurological: Pt is alert. No cranial nerve deficit.  Skin: Skin is warm. No erythema.  Psychiatric: Pt behavior is  normal. Thought content normal.  L knee is a little tender to palp CT angio - ok ECHO - OK for age  Lab Results  Component Value Date   WBC 4.7 05/29/2013   HGB 13.5 05/29/2013   HCT 41.9 05/29/2013   PLT 223.0 05/29/2013   GLUCOSE 95 07/30/2013   CHOL 174 05/11/2010   TRIG 78.0 05/11/2010   HDL 52.30 05/11/2010   LDLDIRECT 130.8 09/30/2006   LDLCALC 106*  05/11/2010   ALT 12 01/01/2013   AST 19 01/01/2013   NA 135 07/30/2013   K 4.7 07/30/2013   CL 98 07/30/2013   CREATININE 1.1 07/30/2013   BUN 22 07/30/2013   CO2 32 07/30/2013   TSH 0.50 08/23/2013   INR 1.00 12/13/2009   HGBA1C 6.0 04/10/2012       Assessment & Plan:

## 2013-12-24 ENCOUNTER — Ambulatory Visit (INDEPENDENT_AMBULATORY_CARE_PROVIDER_SITE_OTHER): Payer: Medicare Other | Admitting: Psychology

## 2013-12-24 DIAGNOSIS — F331 Major depressive disorder, recurrent, moderate: Secondary | ICD-10-CM | POA: Diagnosis not present

## 2014-01-01 ENCOUNTER — Encounter (HOSPITAL_COMMUNITY): Payer: Self-pay | Admitting: Psychiatry

## 2014-01-01 ENCOUNTER — Ambulatory Visit (INDEPENDENT_AMBULATORY_CARE_PROVIDER_SITE_OTHER): Payer: Medicare Other | Admitting: Psychiatry

## 2014-01-01 VITALS — BP 135/85 | HR 75 | Ht 64.0 in | Wt 180.4 lb

## 2014-01-01 DIAGNOSIS — F329 Major depressive disorder, single episode, unspecified: Secondary | ICD-10-CM | POA: Diagnosis not present

## 2014-01-01 NOTE — Progress Notes (Signed)
[place] [phone] Progress Note  [name] [id number] 80 y.o.  01/01/2014 12:02 PM  Chief Complaint:  Medication management and followup.       History presenting illness [name] came for her followup appointment.   On her last visit she wanted to come off of Lexapro and she is taking Lexapro 5 mg .  Recently she visited primary care physician and there has been no changes.  She does not see any worsening of the symptoms since taking only 5 mg of Lexapro.  She wants to continue the current dose at this time.  She denies any irritability, anger, mood swings.  However she feel tired and lack of energy.  She denies any feeling of hopelessness or worthlessness.  Her appetite is okay.  Her vitals are stable.  She lives by herself.  Suicidal Ideation: No Plan Formed: No Patient has means to carry out plan: No  Homicidal Ideation: No Plan Formed: No Patient has means to carry out plan: No  Review of Systems: Psychiatric: Agitation: No Hallucination: No Depressed Mood: No Insomnia: No Hypersomnia: No Altered Concentration: No Feels Worthless: No Grandiose Ideas: No Belief In Special Powers: No New/Increased Substance Abuse: No Compulsions: No  Neurologic: Headache: Yes Seizure: No Paresthesias: No Review of Systems  Musculoskeletal: Positive for joint pain.   Medical history Patient has history of hypothyroidism, vitamin D deficiency, hyperlipidemia, hypertension, allergic rhinitis, GERD, and a fissure, cystitis, chronic diarrhea, afibrillation, osteoporosis, chronic fatigue, history of breast cancer, hearing loss, irritable bladder, chronic knee pain and dysphagia.  Her primary care physician is Dr. Richardson [name].  Outpatient Encounter Prescriptions as of 01/01/2014  Medication Sig  . allopurinol (ZYLOPRIM) 300 MG tablet TAKE ONE TABLET BY MOUTH EVERY DAY as needed  . bisoprolol (ZEBETA) 5 MG tablet Take 0.5 tablets (2.5 mg total) by mouth daily.  . Calcium  Carbonate-Vit D-Min (CALTRATE 600+D PLUS PO) Take 1 tablet by mouth daily.  . Cetirizine HCl (ZYRTEC ALLERGY) 10 MG CAPS Take 1 capsule by mouth daily as needed.   . Cholecalciferol (VITAMIN D3) 2000 UNITS TABS Take 1 tablet by mouth as needed.   [name] EPIPEN 2-PAK 0.3 MG/0.3ML DEVI Inject 1 Device into the muscle daily as needed. For allergic reaction  . escitalopram (LEXAPRO) 10 MG tablet Take 1 tablet (10 mg total) by mouth daily.  . Garlic 224 MG TABS Take 1 tablet by mouth daily.  [name] levothyroxine (SYNTHROID, LEVOTHROID) 100 MCG tablet TAKE ONE TABLET BY MOUTH ONCE DAILY  . LORazepam (ATIVAN) 0.5 MG tablet TAKE ONE TABLET BY MOUTH ONCE DAILY AS NEEDED FOR ANXIETY  . magnesium oxide (MAG-OX) 400 MG tablet Take 400 mg by mouth daily.  [name] oxybutynin (DITROPAN) 5 MG tablet TAKE ONE TABLET BY MOUTH THREE TIMES DAILY  . Psyllium (METAMUCIL PO) Once daily  . RABEprazole (ACIPHEX) 20 MG tablet Take 1 tablet by mouth 2 (two) times daily before a meal.  . spironolactone (ALDACTONE) 25 MG tablet Take 25 mg by mouth every Monday, Wednesday, and Friday. PRN    Past Psychiatric History/Hospitalization(s): Anxiety: Yes Bipolar Disorder: No Depression: Yes Mania: No Psychosis: No Schizophrenia: No Personality Disorder: No Hospitalization for psychiatric illness: Yes History of Electroconvulsive Shock Therapy: No Prior Suicide Attempts: No  Physical Exam: Constitutional:  BP 135/85  Pulse 75  Ht 5\' 4"  (1.626 m)  Wt 180 lb 6.4 oz (81.829 kg)  BMI 30.95 kg/m2  Mental status examination Patient is casually dressed and fairly groomed.  She is pleasant and cooperative.  Her speech is clear.  She denies any active or passive suicidal thoughts or homicidal thoughts.  There were no paranoia or delusions present at this time.  She describes her mood as good and her affect is mood appropriate.  Her attention concentration is okay.  She is alert and oriented x3.  Her insight judgment and impulse control is  okay.  Established Problem, Stable/Improving (1), Review of Last Therapy Session (1) and Review of Medication Regimen & Side Effects (2)  Assessment: Axis I: Maj. depressive disorder  Axis II: Deferred  Axis III: See medical history  Axis IV: Mild to moderate  Axis V: 60-65   Plan:  Patient wants to continue Lexapro 5 mg daily.  She does not see any worsening of the symptoms since dose decrease.  She still believed her IBS symptoms caused by Lexapro and in the future she wants to come off from Lexapro.  Discussed risk and benefits of medication.  Recommended to call us back if she has any question or any concern.  I will see her again in 3 months.  ARFEEN,SYED T., MD 01/01/2014

## 2014-01-02 ENCOUNTER — Ambulatory Visit: Payer: Self-pay | Admitting: Internal Medicine

## 2014-01-08 DIAGNOSIS — J3089 Other allergic rhinitis: Secondary | ICD-10-CM | POA: Diagnosis not present

## 2014-01-08 DIAGNOSIS — R0602 Shortness of breath: Secondary | ICD-10-CM | POA: Diagnosis not present

## 2014-01-08 DIAGNOSIS — J309 Allergic rhinitis, unspecified: Secondary | ICD-10-CM | POA: Diagnosis not present

## 2014-01-08 DIAGNOSIS — J301 Allergic rhinitis due to pollen: Secondary | ICD-10-CM | POA: Diagnosis not present

## 2014-01-21 ENCOUNTER — Ambulatory Visit (INDEPENDENT_AMBULATORY_CARE_PROVIDER_SITE_OTHER): Payer: Medicare Other | Admitting: Psychology

## 2014-01-21 DIAGNOSIS — F331 Major depressive disorder, recurrent, moderate: Secondary | ICD-10-CM | POA: Diagnosis not present

## 2014-01-22 ENCOUNTER — Encounter (HOSPITAL_COMMUNITY): Payer: Self-pay | Admitting: Emergency Medicine

## 2014-01-22 ENCOUNTER — Emergency Department (HOSPITAL_COMMUNITY)
Admission: EM | Admit: 2014-01-22 | Discharge: 2014-01-22 | Disposition: A | Discharge status: Home / Self Care | Payer: Medicare Other | Attending: Emergency Medicine | Admitting: Emergency Medicine

## 2014-01-22 ENCOUNTER — Telehealth: Payer: Self-pay | Admitting: Internal Medicine

## 2014-01-22 ENCOUNTER — Emergency Department (HOSPITAL_COMMUNITY): Payer: Medicare Other

## 2014-01-22 DIAGNOSIS — R0602 Shortness of breath: Secondary | ICD-10-CM

## 2014-01-22 DIAGNOSIS — E039 Hypothyroidism, unspecified: Secondary | ICD-10-CM | POA: Insufficient documentation

## 2014-01-22 DIAGNOSIS — I10 Essential (primary) hypertension: Secondary | ICD-10-CM | POA: Diagnosis not present

## 2014-01-22 DIAGNOSIS — K219 Gastro-esophageal reflux disease without esophagitis: Secondary | ICD-10-CM | POA: Insufficient documentation

## 2014-01-22 DIAGNOSIS — Z8669 Personal history of other diseases of the nervous system and sense organs: Secondary | ICD-10-CM | POA: Diagnosis not present

## 2014-01-22 DIAGNOSIS — R079 Chest pain, unspecified: Secondary | ICD-10-CM | POA: Diagnosis not present

## 2014-01-22 DIAGNOSIS — F411 Generalized anxiety disorder: Secondary | ICD-10-CM | POA: Diagnosis not present

## 2014-01-22 DIAGNOSIS — M1990 Unspecified osteoarthritis, unspecified site: Secondary | ICD-10-CM | POA: Diagnosis not present

## 2014-01-22 DIAGNOSIS — F3289 Other specified depressive episodes: Secondary | ICD-10-CM | POA: Diagnosis not present

## 2014-01-22 DIAGNOSIS — Z853 Personal history of malignant neoplasm of breast: Secondary | ICD-10-CM | POA: Insufficient documentation

## 2014-01-22 DIAGNOSIS — R0789 Other chest pain: Secondary | ICD-10-CM | POA: Diagnosis not present

## 2014-01-22 DIAGNOSIS — Z79899 Other long term (current) drug therapy: Secondary | ICD-10-CM | POA: Diagnosis not present

## 2014-01-22 DIAGNOSIS — F329 Major depressive disorder, single episode, unspecified: Secondary | ICD-10-CM | POA: Insufficient documentation

## 2014-01-22 DIAGNOSIS — Z950 Presence of cardiac pacemaker: Secondary | ICD-10-CM | POA: Diagnosis not present

## 2014-01-22 DIAGNOSIS — R1011 Right upper quadrant pain: Secondary | ICD-10-CM | POA: Diagnosis not present

## 2014-01-22 LAB — CBC WITH DIFFERENTIAL/PLATELET
BASOS PCT: 0 % (ref 0–1)
Basophils Absolute: 0 10*3/uL (ref 0.0–0.1)
EOS PCT: 3 % (ref 0–5)
Eosinophils Absolute: 0.1 10*3/uL (ref 0.0–0.7)
HEMATOCRIT: 42.7 % (ref 36.0–46.0)
HEMOGLOBIN: 14.1 g/dL (ref 12.0–15.0)
Lymphocytes Relative: 28 % (ref 12–46)
Lymphs Abs: 1.3 10*3/uL (ref 0.7–4.0)
MCH: 33.3 pg (ref 26.0–34.0)
MCHC: 33 g/dL (ref 30.0–36.0)
MCV: 100.9 fL — ABNORMAL HIGH (ref 78.0–100.0)
MONO ABS: 0.4 10*3/uL (ref 0.1–1.0)
MONOS PCT: 8 % (ref 3–12)
NEUTROS ABS: 2.8 10*3/uL (ref 1.7–7.7)
Neutrophils Relative %: 61 % (ref 43–77)
Platelets: 231 10*3/uL (ref 150–400)
RBC: 4.23 MIL/uL (ref 3.87–5.11)
RDW: 14.4 % (ref 11.5–15.5)
WBC: 4.6 10*3/uL (ref 4.0–10.5)

## 2014-01-22 LAB — I-STAT TROPONIN, ED
Troponin i, poc: 0.01 ng/mL (ref 0.00–0.08)
Troponin i, poc: 0 ng/mL (ref 0.00–0.08)

## 2014-01-22 LAB — BASIC METABOLIC PANEL
Anion gap: 12 (ref 5–15)
BUN: 18 mg/dL (ref 6–23)
CO2: 25 mEq/L (ref 19–32)
CREATININE: 0.83 mg/dL (ref 0.50–1.10)
Calcium: 9.3 mg/dL (ref 8.4–10.5)
Chloride: 101 mEq/L (ref 96–112)
GFR, EST AFRICAN AMERICAN: 75 mL/min — AB (ref >90)
GFR, EST NON AFRICAN AMERICAN: 64 mL/min — AB (ref >90)
GLUCOSE: 105 mg/dL — AB (ref 70–99)
Potassium: 4 mEq/L (ref 3.7–5.3)
Sodium: 138 mEq/L (ref 137–147)

## 2014-01-22 LAB — PRO B NATRIURETIC PEPTIDE: Pro B Natriuretic peptide (BNP): 283.3 pg/mL (ref 0–450)

## 2014-01-22 NOTE — Telephone Encounter (Signed)
Spoke with pt who reports complains of increased SOB, chest tightness and "back spasms."  She reports she is unable to much of anything and SOB is at rest.  She has had this most all night.  Does not feel like the chest pain is gas per her report.  Nothing is helping her s/s.  She reports a 6 lb wt gain in the past 2 to 4 weeks.  She is unsure of the timing because she doesn't always weigh everyday.  She was on spironolactone but reports she was told not to take it by Dr Melvyn Novas.  Dr. Alain Marion instructed her to take it if she thought she was holding onto fluid so she started taking it every other day about 2 weeks ago.  It has not helped with her SOB or feelings of fluid retention.  Pt is obviously very SOB with talking and because of this, her chest tightness and back spasms was instructed to report via EMS to [facility].  Trish notified.

## 2014-01-22 NOTE — ED Notes (Signed)
Per [employer]: Pt's dual-chamber pacemaker has no new findings. One incident of high rate was found on Sept. 2nd. Lead measurement are normal.

## 2014-01-22 NOTE — Discharge Instructions (Signed)
Please read and follow all provided instructions.  Your diagnoses today include:  1. Shortness of breath   2. Chest tightness    Tests performed today include:  An EKG of your heart   A chest x-ray  Cardiac enzymes - a blood test for heart muscle damage  Blood counts and electrolytes  Vital signs. See below for your results today.   Medications prescribed:   None  Take any prescribed medications only as directed.  Follow-up instructions: Please follow-up with your primary care provider as soon as you can for further evaluation of your symptoms.   Return instructions:  SEEK IMMEDIATE MEDICAL ATTENTION IF:  You have severe chest pain, especially if the pain is crushing or pressure-like and spreads to the arms, back, neck, or jaw, or if you have sweating, nausea (feeling sick to your stomach), or shortness of breath. THIS IS AN EMERGENCY. Don't wait to see if the pain will go away. Get medical help at once. Call 911 or 0 (operator). DO NOT drive yourself to the hospital.   Your chest pain gets worse and does not go away with rest.   You have an attack of chest pain lasting longer than usual, despite rest and treatment with the medications your caregiver has prescribed.   You wake from sleep with chest pain or shortness of breath.  You feel dizzy or faint.  You have chest pain not typical of your usual pain for which you originally saw your caregiver.   You have any other emergent concerns regarding your health.  Additional Information: Chest pain comes from many different causes. Your caregiver has diagnosed you as having chest pain that is not specific for one problem, but does not require admission.  You are at low risk for an acute heart condition or other serious illness.   Your vital signs today were: BP 148/88   Pulse 54   Temp(Src) 98 F (36.7 C) (Oral)   Resp 14   SpO2 100% If your blood pressure (BP) was elevated above 135/85 this visit, please have this  repeated by your doctor within one month. --------------

## 2014-01-22 NOTE — ED Provider Notes (Signed)
CSN: [id number]     Arrival date & time 01/22/14  1205 History   First MD Initiated Contact with Patient 01/22/14 1206     Chief Complaint  Patient presents with  . Gastrophageal Reflux   (Consider location/radiation/quality/duration/timing/severity/associated sxs/prior Treatment) HPI Comments:  Patient with history of pacemaker due to symptomatic bradycardia, GERD, grade 1 diastolic heart failure, chronic shortness of breath -- presents with complaint of intermittent chest pressure that starts in her stomach goes up into her middle chest. She states that this is not a pain but more of a tightness. She has had this sensation may times in the past and attributes it to her GERD. Patient has also had continue shortness of breath it is much worse with exertion. She has been seen by cardiology and pulmonology for this problem. Today, her symptoms were worse and more significant at rest which was unusual. She denies any swelling of her lower extremities or cough. She denies fever, nausea, vomiting. No urinary symptoms. Patient called her cardiologist's office and was told to come to the emergency department. Patient does not think that she needs to be here and she does not want to be here. No treatments PTA.    The history is provided by the patient and medical records.    Past Medical History  Diagnosis Date  . Thyroid disease   . Glaucoma   . IBS (irritable bowel syndrome)   . Incontinence   . GERD (gastroesophageal reflux disease)   . Hypertension   . Anxiety   . Depression     Dr Cheryln Manly  . HX: breast cancer   . Osteoarthritis   . Osteopenia   . Bradycardia   . IBS (irritable bowel syndrome)   . Discoid lupus     skin  . Pacemaker     Brady//Chronotropic incompetence with normal pacemaker function  . Full dentures   . Wears hearing aid     both ears  . Hx of echocardiogram     a.  Echocardiogram (03/2011): Mild LVH, EF 53-66%, grade 1 diastolic dysfunction, mild MR, mild to  moderate TR, PASP 42;  b. Echo (05/2013):  Mod LVH, EF 65-70%, no WMA, Gr 1 DD, mild MR, mod TR, PASP 39  . Hx of exercise stress test     a. ETT-echocardiogram (06/08/11): Sub-optimal exercise. Test stopped early due to dizziness and hypotension. EF 60%.  Non-diagnostic.;   b. Lexiscan Myoview (06/2013):  Diaphragmatic attenuation, no ischemia, EF 61%.  Low Risk  . Pacemaker    Past Surgical History  Procedure Laterality Date  . Pacemaker placement  2009  . Foot surgery  2004    Left-hammer toes  . Mastectomy  2002    bilat mastectomy-snbx  . Breast lumpectomy  1995    lt -axillary node dissectoin  . Dorsal compartment release  2000    left  . Abdominal hysterectomy    . Eye surgery  2011    cataract-lt  . Knee arthroscopy Left 08/29/2012    Procedure: LEFT KNEE ARTHROSCOPY ;  Surgeon: Hessie Dibble, MD;  Location: [facility];  Service: Orthopedics;  Laterality: Left;  . Trigger finger release Right 07/31/2013    Procedure: EXCISE MASS RIGHT INDEX A-1 PLLLEY RELEASE A-1 RIGHT INDEX;  Surgeon: Cammie Sickle., MD;  Location: [facility];  Service: Orthopedics;  Laterality: Right;   Family History  Problem Relation Age of Onset  . Ovarian cancer Mother   . Heart disease  Father   . Stroke Sister   . Stroke Brother   . Cancer Brother   . Heart disease Brother    History  Substance Use Topics  . Smoking status: Never Smoker   . Smokeless tobacco: Never Used  . Alcohol Use: No   OB History   Grav Para Term Preterm Abortions TAB SAB Ect Mult Living                 Review of Systems  Constitutional: Negative for fever and diaphoresis.  Eyes: Negative for redness.  Respiratory: Positive for chest tightness and shortness of breath. Negative for cough.   Cardiovascular: Negative for chest pain, palpitations and leg swelling.  Gastrointestinal: Negative for nausea, vomiting and abdominal pain.  Genitourinary: Negative for dysuria.  Musculoskeletal:  Negative for back pain and neck pain.  Skin: Negative for rash.  Neurological: Negative for syncope and light-headedness.      Allergies  Amlodipine besy-benazepril hcl; Amlodipine besylate; Clonidine hydrochloride; Lactose intolerance (gi); Valsartan; and Verapamil  Home Medications   Prior to Admission medications   Medication Sig Start Date End Date Taking? Authorizing Provider  allopurinol (ZYLOPRIM) 300 MG tablet TAKE ONE TABLET BY MOUTH EVERY DAY as needed 06/11/13   Deboraha Sprang, MD  bisoprolol (ZEBETA) 5 MG tablet Take 0.5 tablets (2.5 mg total) by mouth daily. 08/29/13   Tanda Rockers, MD  Calcium Carbonate-Vit D-Min (CALTRATE 600+D PLUS PO) Take 1 tablet by mouth daily.    Historical Provider, MD  Cetirizine HCl (ZYRTEC ALLERGY) 10 MG CAPS Take 1 capsule by mouth daily as needed.     Historical Provider, MD  Cholecalciferol (VITAMIN D3) 2000 UNITS TABS Take 1 tablet by mouth as needed.     Historical Provider, MD  EPIPEN 2-PAK 0.3 MG/0.3ML DEVI Inject 1 Device into the muscle daily as needed. For allergic reaction 02/15/12   Historical Provider, MD  escitalopram (LEXAPRO) 10 MG tablet Take 1 tablet (10 mg total) by mouth daily. 09/03/13   Kathlee Nations, MD  Garlic 035 MG TABS Take 1 tablet by mouth daily.    Historical Provider, MD  levothyroxine (SYNTHROID, LEVOTHROID) 100 MCG tablet TAKE ONE TABLET BY MOUTH ONCE DAILY    Aleksei Plotnikov V, MD  LORazepam (ATIVAN) 0.5 MG tablet TAKE ONE TABLET BY MOUTH ONCE DAILY AS NEEDED FOR ANXIETY 12/04/13   Kathlee Nations, MD  magnesium oxide (MAG-OX) 400 MG tablet Take 400 mg by mouth daily.    Historical Provider, MD  oxybutynin (DITROPAN) 5 MG tablet TAKE ONE TABLET BY MOUTH THREE TIMES DAILY 07/04/13   Lew Dawes V, MD  Psyllium (METAMUCIL PO) Once daily    Historical Provider, MD  RABEprazole (ACIPHEX) 20 MG tablet Take 1 tablet by mouth 2 (two) times daily before a meal. 07/16/13   Historical Provider, MD  spironolactone  (ALDACTONE) 25 MG tablet Take 25 mg by mouth every Monday, Wednesday, and Friday. PRN 07/23/13   Liliane Shi, PA-C   BP 146/83  Pulse 68  Temp(Src) 97.9 F (36.6 C) (Oral)  Resp 14  SpO2 100%  Physical Exam  Nursing note and vitals reviewed. Constitutional: She appears well-developed and well-nourished.  HENT:  Head: Normocephalic and atraumatic.  Mouth/Throat: Mucous membranes are normal. Mucous membranes are not dry.  Eyes: Conjunctivae are normal.  Neck: Trachea normal and normal range of motion. Neck supple. Normal carotid pulses and no JVD present. No muscular tenderness present. Carotid bruit is not present. No tracheal deviation  present.  Cardiovascular: Normal rate, regular rhythm, S1 normal, S2 normal, normal heart sounds and intact distal pulses.  Exam reveals no decreased pulses.   No murmur heard. Pulmonary/Chest: Effort normal. No respiratory distress. She has no wheezes. She exhibits no tenderness.  Abdominal: Soft. Normal aorta and bowel sounds are normal. There is no tenderness. There is no rebound and no guarding.  Musculoskeletal: Normal range of motion.  Neurological: She is alert.  Skin: Skin is warm and dry. She is not diaphoretic. No cyanosis. No pallor.  Psychiatric: She has a normal mood and affect.    ED Course  Procedures (including critical care time) Labs Review Labs Reviewed  CBC WITH DIFFERENTIAL - Abnormal; Notable for the following:    MCV 100.9 (*)    All other components within normal limits  BASIC METABOLIC PANEL - Abnormal; Notable for the following:    Glucose, Bld 105 (*)    GFR calc non Af Amer 64 (*)    GFR calc Af Amer 75 (*)    All other components within normal limits  PRO B NATRIURETIC PEPTIDE  I-STAT TROPOININ, ED  I-STAT TROPOININ, ED    Imaging Review Dg Chest 2 View  01/22/2014   CLINICAL DATA:  Chest pain and shortness of breath  EXAM: CHEST  2 VIEW  COMPARISON:  07/02/2013  FINDINGS: A pacing device is again noted. The  cardiac shadow is stable. Postoperative changes are noted on the left. The lungs are well aerated without focal infiltrate or sizable effusion. No acute bony abnormality is seen.  IMPRESSION: Postoperative change without acute abnormality.   Electronically Signed   By: Inez Catalina M.D.   On: 01/22/2014 14:22     EKG Interpretation None       Date: 01/22/2014  Rate: 61  Rhythm: Atrial paced  QRS Axis: left  Intervals: normal  ST/T Wave abnormalities: normal  Conduction Disutrbances:left anterior fascicular block  Narrative Interpretation:   Old EKG Reviewed: unchanged  12:11 PM Telephone notes from earlier today notes patient having CP and SOB, weight gain.   Vital signs reviewed and are as follows: BP 146/83  Pulse 68  Temp(Src) 97.9 F (36.6 C) (Oral)  Resp 14  SpO2 100%  12:29 PM Patient seen and examined.   3:08 PM EKG reviewed. Potassium is elevated due to hemolysis and is being redrawn.   5:08 PM K normal, pending 2nd troponin. Patient's exam is unchanged and she appears well.   Patient seen by Dr. Dina Rich. Patient does not want to stay in hospital. Agrees with delta troponin with discharge to home if negative. Patient and family agree to follow-up with PCP/cardiologist this week for recheck.   If trop neg -- will d/c to home.    MDM   Final diagnoses:  Shortness of breath  Chest tightness   Patient with long-standing SOB and chest tightness/GERD. Symptoms today are the same as previous, however it was more severe this morning. Patient came to the emergency department because she was instructed to by her cardiologist. Patient's workup today is unremarkable. She has ambulated without desaturation or difficulty. Unchanged, nonischemic EKG. Delta troponin is negative. Do not suspect ACS/anginal equivalent. Do not suspect PE or undiagnosed COPD. No signs of fluid overload. No indications for hospitalization at this time.   No dangerous or life-threatening conditions  suspected or identified by history, physical exam, and by work-up. No indications for hospitalization identified.       Carlisle Cater, PA-C 01/22/14 1715

## 2014-01-22 NOTE — ED Notes (Signed)
Pt ambulated to restroom; pulse ox 98-100% while ambulating. Tolerated walking well.

## 2014-01-22 NOTE — Telephone Encounter (Signed)
New Message  Pt called @ 8:36 am on 01-Sep-2031 to make an urgent appt.. Pt states she has breathing and heart problems. Pains in her chest.. She also states she has gained 6-7 lbs in the last 4 days. Transferred call to triage nurse

## 2014-01-22 NOTE — ED Notes (Signed)
Pt reports intermittent upper esophageal discomfort. Pt states "it feels like something is stuck in my throat." Airway. Pt denies CP but does report exertional SOB x "all year". Pt in NAD. EKG unremarkable. 134/85. 74 NSR. 16 RR. 98% RA. AO x4.

## 2014-01-23 NOTE — ED Provider Notes (Signed)
Medical screening examination/treatment/procedure(s) were conducted as a shared visit with non-physician practitioner(s) and myself.  I personally evaluated the patient during the encounter.   EKG Interpretation None      Patient presents with intermittent chest pain. She relates it to her prior GERD symptoms. She has chronic shortness of breath with exertion. Reports that she felt like her symptoms were worse than normal today. She called her cardiologist and her primary care physician who referred her here.  On my exam she has no complaints. She is elderly but nontoxic. Pacemaker in place.  Has multiple times requested to be discharged. She does not wish to stay. EKG is reassuring and unchanged. Pacemaker was evaluated and shows no acute events.  Delta troponin is negative. Given that the patient wishes to go home, feel is reasonable for her to followup with her cardiologist closely given that patient does not wish to be admitted. Patient was given strict return precautions.  Pain and shortness of breath are chronic for the patient and have low suspicion at this time for acute ACS.  After history, exam, and medical workup I feel the patient has been appropriately medically screened and is safe for discharge home. Pertinent diagnoses were discussed with the patient. Patient was given return precautions.   Merryl Hacker, MD 01/23/14 [phone]

## 2014-01-24 ENCOUNTER — Other Ambulatory Visit (HOSPITAL_COMMUNITY): Payer: Self-pay | Admitting: Psychiatry

## 2014-01-24 ENCOUNTER — Other Ambulatory Visit: Payer: Self-pay | Admitting: Internal Medicine

## 2014-01-28 ENCOUNTER — Ambulatory Visit (INDEPENDENT_AMBULATORY_CARE_PROVIDER_SITE_OTHER): Payer: Medicare Other | Admitting: Internal Medicine

## 2014-01-28 ENCOUNTER — Encounter: Payer: Self-pay | Admitting: Internal Medicine

## 2014-01-28 VITALS — BP 130/84 | HR 80 | Temp 98.4°F | Resp 16 | Wt 181.0 lb

## 2014-01-28 DIAGNOSIS — E669 Obesity, unspecified: Secondary | ICD-10-CM

## 2014-01-28 DIAGNOSIS — I472 Ventricular tachycardia, unspecified: Secondary | ICD-10-CM

## 2014-01-28 DIAGNOSIS — K589 Irritable bowel syndrome without diarrhea: Secondary | ICD-10-CM

## 2014-01-28 DIAGNOSIS — G9332 Myalgic encephalomyelitis/chronic fatigue syndrome: Secondary | ICD-10-CM

## 2014-01-28 DIAGNOSIS — R5382 Chronic fatigue, unspecified: Secondary | ICD-10-CM | POA: Diagnosis not present

## 2014-01-28 DIAGNOSIS — I119 Hypertensive heart disease without heart failure: Secondary | ICD-10-CM | POA: Diagnosis not present

## 2014-01-28 DIAGNOSIS — I4729 Other ventricular tachycardia: Secondary | ICD-10-CM

## 2014-01-28 NOTE — Assessment & Plan Note (Signed)
  On diet  

## 2014-01-28 NOTE — Assessment & Plan Note (Signed)
Continue with current prescription therapy as reflected on the Med list.  

## 2014-01-28 NOTE — Progress Notes (Signed)
Subjective:     HPI      01/22/14 ER visit for: Patient with long-standing SOB and chest tightness/GERD. Symptoms today are the same as previous, however it was more severe this morning. Patient came to the emergency department because she was instructed to by her cardiologist. Patient's workup today is unremarkable. She has ambulated without desaturation or difficulty. Unchanged, nonischemic EKG. Delta troponin is negative. Do not suspect ACS/anginal equivalent. Do not suspect PE or undiagnosed COPD. No signs of fluid overload. No indications for hospitalization at this time.  No dangerous or life-threatening conditions suspected or identified by history, physical exam, and by work-up. No indications for hospitalization identified.    The patient is here to follow up on chronic IBS worse lately, GERD,  depression, anxiety, SOB symptoms controlled with medicines.  F/u  legs are fatigued. No LBP... F/u ongoing IBS sx's. She saw Dr Earlean Shawl - endoscopy is planned. She is planning to be seen at [place].    Wt Readings from Last 3 Encounters:  01/28/14 181 lb (82.101 kg)  01/01/14 180 lb 6.4 oz (81.829 kg)  12/04/13 178 lb (80.74 kg)   BP Readings from Last 3 Encounters:  01/28/14 130/84  01/22/14 144/86  01/01/14 135/85      Past Medical History  Diagnosis Date  . Thyroid disease   . Glaucoma   . IBS (irritable bowel syndrome)   . Incontinence   . GERD (gastroesophageal reflux disease)   . Hypertension   . Anxiety   . Depression     Dr Cheryln Manly  . HX: breast cancer   . Osteoarthritis   . Osteopenia   . Bradycardia   . IBS (irritable bowel syndrome)   . Discoid lupus     skin  . Pacemaker     Brady//Chronotropic incompetence with normal pacemaker function  . Full dentures   . Wears hearing aid     both ears  . Hx of echocardiogram     a.  Echocardiogram (03/2011): Mild LVH, EF 70-35%, grade 1 diastolic dysfunction, mild MR, mild to moderate TR, PASP 42;  b. Echo  (05/2013):  Mod LVH, EF 65-70%, no WMA, Gr 1 DD, mild MR, mod TR, PASP 39  . Hx of exercise stress test     a. ETT-echocardiogram (06/08/11): Sub-optimal exercise. Test stopped early due to dizziness and hypotension. EF 60%.  Non-diagnostic.;   b. Lexiscan Myoview (06/2013):  Diaphragmatic attenuation, no ischemia, EF 61%.  Low Risk  . Pacemaker    Past Surgical History  Procedure Laterality Date  . Pacemaker placement  2009  . Foot surgery  2004    Left-hammer toes  . Mastectomy  2002    bilat mastectomy-snbx  . Breast lumpectomy  1995    lt -axillary node dissectoin  . Dorsal compartment release  2000    left  . Abdominal hysterectomy    . Eye surgery  2011    cataract-lt  . Knee arthroscopy Left 08/29/2012    Procedure: LEFT KNEE ARTHROSCOPY ;  Surgeon: Hessie Dibble, MD;  Location: [facility];  Service: Orthopedics;  Laterality: Left;  . Trigger finger release Right 07/31/2013    Procedure: EXCISE MASS RIGHT INDEX A-1 PLLLEY RELEASE A-1 RIGHT INDEX;  Surgeon: Cammie Sickle., MD;  Location: [place];  Service: Orthopedics;  Laterality: Right;    reports that she has never smoked. She has never used smokeless tobacco. She reports that she does not drink  alcohol or use illicit drugs. family history includes Cancer in her brother; Heart disease in her brother and father; Ovarian cancer in her mother; Stroke in her brother and sister. Allergies  Allergen Reactions  . Amlodipine Besy-Benazepril Hcl Swelling  . Amlodipine Besylate Swelling  . Clonidine Hydrochloride     REACTION: tired  . Lactose Intolerance (Gi) Other (See Comments)  . Valsartan Other (See Comments)    Tongue swelling   . Verapamil    Current Outpatient Prescriptions on File Prior to Visit  Medication Sig Dispense Refill  . allopurinol (ZYLOPRIM) 300 MG tablet TAKE ONE TABLET BY MOUTH ONCE DAILY  30 tablet  4  . bisoprolol (ZEBETA) 5 MG tablet Take 0.5 tablets (2.5 mg total) by  mouth daily.  30 tablet  11  . Calcium Carbonate-Vit D-Min (CALTRATE 600+D PLUS PO) Take 1 tablet by mouth daily.      . Cetirizine HCl (ZYRTEC ALLERGY) 10 MG CAPS Take 1 capsule by mouth daily as needed.       . Cholecalciferol (VITAMIN D3) 2000 UNITS TABS Take 1 tablet by mouth as needed.       [name] EPIPEN 2-PAK 0.3 MG/0.3ML DEVI Inject 1 Device into the muscle daily as needed. For allergic reaction      . escitalopram (LEXAPRO) 10 MG tablet Take 5 mg by mouth daily.      . Garlic 814 MG TABS Take 1 tablet by mouth daily.      [name] levothyroxine (SYNTHROID, LEVOTHROID) 100 MCG tablet Take 100 mcg by mouth daily before breakfast.      . LORazepam (ATIVAN) 0.5 MG tablet Take 0.5 mg by mouth daily as needed for anxiety.      . magnesium oxide (MAG-OX) 400 MG tablet Take 400 mg by mouth daily.      [name] oxybutynin (DITROPAN-XL) 5 MG 24 hr tablet Take 5 mg by mouth 3 (three) times daily.      [name] PRESCRIPTION MEDICATION Inject 1 each into the skin See admin instructions. January febuary July august pt gets injection week. All other months patient gets patient gets once monthly. Patient is followed by Dr. Harold Hedge. At [facility]. Allergy and asthma      . Psyllium (METAMUCIL PO) Once daily      . RABEprazole (ACIPHEX) 20 MG tablet Take 1 tablet by mouth 2 (two) times daily before a meal.      . spironolactone (ALDACTONE) 25 MG tablet Take 25 mg by mouth every Monday, Wednesday, and Friday. PRN       No current facility-administered medications on file prior to visit.   Review of Systems Review of Systems  Constitutional: Negative for diaphoresis and unexpected weight change.  HENT: Negative for drooling and tinnitus.   Eyes: Negative for photophobia and visual disturbance.  Respiratory: Negative for choking and stridor.   Gastrointestinal: Negative for vomiting and blood in stool.  Genitourinary: Negative for hematuria and decreased urine volume.  Musculoskeletal: Negative for gait problem.  Skin:  Negative for color change and wound.  Neurological: Negative for tremors and numbness.  Psychiatric/Behavioral: Negative for decreased concentration. The patient is not hyperactive.   Knee pain    Objective:   Physical Exam BP 130/84  Pulse 80  Temp(Src) 98.4 F (36.9 C) (Oral)  Resp 16  Wt 181 lb (82.101 kg) Physical Exam  VS noted Constitutional: Pt appears well-developed and well-nourished.  HENT: Head: Normocephalic.  Right Ear: External ear normal.  Left Ear: External ear normal.  Eyes:  Conjunctivae and EOM are normal. Pupils are equal, round, and reactive to light.  Neck: Normal range of motion. Neck supple.  Cardiovascular: Normal rate and regular rhythm.   Pulmonary/Chest: Effort normal and breath sounds normal. no rales or wheezing Abd:  Soft, NT, non-distended, + BS Neurological: Pt is alert. No cranial nerve deficit.  Skin: Skin is warm. No erythema.  Psychiatric: Pt behavior is normal. Thought content normal.    Lab Results  Component Value Date   WBC 4.6 01/22/2014   HGB 14.1 01/22/2014   HCT 42.7 01/22/2014   PLT 231 01/22/2014   GLUCOSE 105* 01/22/2014   CHOL 174 05/11/2010   TRIG 78.0 05/11/2010   HDL 52.30 05/11/2010   LDLDIRECT 130.8 09/30/2006   LDLCALC 106* 05/11/2010   ALT 12 01/01/2013   AST 19 01/01/2013   NA 138 01/22/2014   K 4.0 01/22/2014   CL 101 01/22/2014   CREATININE 0.83 01/22/2014   BUN 18 01/22/2014   CO2 25 01/22/2014   TSH 0.50 08/23/2013   INR 1.00 12/13/2009   HGBA1C 6.0 04/10/2012       Assessment & Plan:

## 2014-01-28 NOTE — Assessment & Plan Note (Signed)
CFS Continue with current diet, meds

## 2014-01-28 NOTE — Progress Notes (Signed)
Pre visit review using our clinic review tool, if applicable. No additional management support is needed unless otherwise documented below in the visit note. 

## 2014-01-30 ENCOUNTER — Encounter: Payer: Self-pay | Admitting: Internal Medicine

## 2014-02-01 DIAGNOSIS — M1712 Unilateral primary osteoarthritis, left knee: Secondary | ICD-10-CM | POA: Diagnosis not present

## 2014-02-01 DIAGNOSIS — M7061 Trochanteric bursitis, right hip: Secondary | ICD-10-CM | POA: Diagnosis not present

## 2014-02-04 ENCOUNTER — Ambulatory Visit: Payer: Medicare Other | Admitting: Psychology

## 2014-02-14 DIAGNOSIS — J301 Allergic rhinitis due to pollen: Secondary | ICD-10-CM | POA: Diagnosis not present

## 2014-02-14 DIAGNOSIS — R0602 Shortness of breath: Secondary | ICD-10-CM | POA: Diagnosis not present

## 2014-02-14 DIAGNOSIS — J302 Other seasonal allergic rhinitis: Secondary | ICD-10-CM | POA: Diagnosis not present

## 2014-02-18 ENCOUNTER — Encounter: Payer: Self-pay | Admitting: Internal Medicine

## 2014-02-18 ENCOUNTER — Ambulatory Visit (INDEPENDENT_AMBULATORY_CARE_PROVIDER_SITE_OTHER): Payer: Medicare Other | Admitting: *Deleted

## 2014-02-18 DIAGNOSIS — I495 Sick sinus syndrome: Secondary | ICD-10-CM

## 2014-02-18 NOTE — Progress Notes (Signed)
Remote pacemaker transmission.   

## 2014-02-19 DIAGNOSIS — H2511 Age-related nuclear cataract, right eye: Secondary | ICD-10-CM | POA: Diagnosis not present

## 2014-02-19 DIAGNOSIS — H401233 Low-tension glaucoma, bilateral, severe stage: Secondary | ICD-10-CM | POA: Diagnosis not present

## 2014-02-20 LAB — MDC_IDC_ENUM_SESS_TYPE_REMOTE
Battery Impedance: 1293 Ohm
Battery Voltage: 2.76 V
Brady Statistic AP VP Percent: 2 %
Brady Statistic AP VS Percent: 82 %
Brady Statistic AS VS Percent: 15 %
Date Time Interrogation Session: 20151026154234
Lead Channel Impedance Value: 509 Ohm
Lead Channel Impedance Value: 548 Ohm
Lead Channel Pacing Threshold Pulse Width: 0.4 ms
Lead Channel Setting Pacing Amplitude: 2 V
Lead Channel Setting Pacing Amplitude: 2.5 V
MDC IDC MSMT BATTERY REMAINING LONGEVITY: 49 mo
MDC IDC MSMT LEADCHNL RA PACING THRESHOLD AMPLITUDE: 0.625 V
MDC IDC MSMT LEADCHNL RV PACING THRESHOLD AMPLITUDE: 0.625 V
MDC IDC MSMT LEADCHNL RV PACING THRESHOLD PULSEWIDTH: 0.4 ms
MDC IDC MSMT LEADCHNL RV SENSING INTR AMPL: 5.6 mV
MDC IDC SET LEADCHNL RV PACING PULSEWIDTH: 0.4 ms
MDC IDC SET LEADCHNL RV SENSING SENSITIVITY: 2 mV
MDC IDC STAT BRADY AS VP PERCENT: 1 %

## 2014-02-27 ENCOUNTER — Encounter: Payer: Self-pay | Admitting: Cardiology

## 2014-03-04 ENCOUNTER — Telehealth: Payer: Self-pay | Admitting: *Deleted

## 2014-03-04 ENCOUNTER — Ambulatory Visit: Payer: Medicare Other | Admitting: Psychology

## 2014-03-04 ENCOUNTER — Other Ambulatory Visit (HOSPITAL_COMMUNITY): Payer: Self-pay | Admitting: Psychiatry

## 2014-03-04 ENCOUNTER — Other Ambulatory Visit: Payer: Self-pay | Admitting: Internal Medicine

## 2014-03-04 NOTE — Telephone Encounter (Signed)
[facility] Triage Call Report Triage Record Num: [id number] Operator: Marshell Garfinkel Patient Name: [name] Call Date & Time: 03/02/2014 5:02:13PM Patient Phone: [phone] PCP: [facility] Patient Gender: Female PCP Fax : Patient DOB: May 23, 1929 Practice Name: [facility] Reason for Call: Caller: Aja/Patient; PCP: Walker Kehr (Adults only); CB#: [phone]; Call regarding Bug bites are itrching wants a suggestion on cream; She has 7 mosquito bites on her arm. She is calling for advice. She went to [facility] already and got a tea tree spray to kill them. She got Sarna and cortaid cream. She is looking for additional advice. RN triaged per insect bits and stings and advised home care for dispositon of "Provide home care for itchy bites". Protocol(s) Used: Bites and Stings - Insects or Spiders Recommended Outcome per Protocol: Provide Home/Self Care Reason for Outcome: Itchy bites Care Advice: Apply cloth-covered ice pack or a cool compress to the area for no more than 20 minutes 4-8 times a day while awake to reduce pain and swelling. ~ 11/

## 2014-03-05 ENCOUNTER — Ambulatory Visit (INDEPENDENT_AMBULATORY_CARE_PROVIDER_SITE_OTHER): Payer: Medicare Other | Admitting: Psychology

## 2014-03-05 DIAGNOSIS — F332 Major depressive disorder, recurrent severe without psychotic features: Secondary | ICD-10-CM | POA: Diagnosis not present

## 2014-03-05 NOTE — Progress Notes (Signed)
Spoke with [name] at Dr. Gertie Exon office and pt to verify that pt has canceled procedure; both have confirmed cancellation.

## 2014-03-06 ENCOUNTER — Ambulatory Visit (HOSPITAL_COMMUNITY): Admission: RE | Admit: 2014-03-06 | Payer: Medicare Other | Source: Ambulatory Visit | Admitting: Ophthalmology

## 2014-03-06 ENCOUNTER — Encounter (HOSPITAL_COMMUNITY): Admission: RE | Payer: Self-pay | Source: Ambulatory Visit

## 2014-03-06 SURGERY — PHACOEMULSIFICATION, CATARACT, WITH IOL INSERTION
Anesthesia: Monitor Anesthesia Care | Laterality: Right

## 2014-03-08 ENCOUNTER — Telehealth: Payer: Self-pay | Admitting: Internal Medicine

## 2014-03-08 NOTE — Telephone Encounter (Signed)
Patient Information:  Caller Name: Janari  Phone: [phone]  Patient: [name], [name]  Gender: Female  DOB: Feb 16, 1930  Age: 80 Years  PCP: Plotnikov, Alex (Adults only)  Office Follow Up:  Does the office need to follow up with this patient?: No  Instructions For The Office: N/A   Symptoms  Reason For Call & Symptoms: Pt fell. She walks up 2 flights of stairs outside her building. As she was trying to step up into her front door. Her toes caught on something and she fell onto the  floor and fell onto her knees and hands.  Reviewed Health History In EMR: Yes  Reviewed Medications In EMR: Yes  Reviewed Allergies In EMR: Yes  Reviewed Surgeries / Procedures: Yes  Date of Onset of Symptoms: 03/08/2014  Guideline(s) Used:  Leg Injury  Knee Injury  Disposition Per Guideline:   Home Care  Reason For Disposition Reached:   Minor knee injury  Advice Given:  Reassurance - Direct Blow (Contusion, Bruise)  A direct blow to your knee can cause a contusion. Contusion is the medical term for bruise.  Symptoms are mild pain, swelling, and/or bruising.  Here is some care advice that should help.  Reassurance - Bending or Twisting Injury (Strain, Sprain):  Strain and sprain are the medical terms used to describe over-stretching of the muscles and ligaments of the knee. A twisting or bending injury can cause a strain or sprain.  The main symptom is pain that is worse with movement and walking. Swelling can occur. Rarely there may be slight bruising.  Reassurance - Bending or Twisting Injury (Strain, Sprain):  Strain and sprain are the medical terms used to describe over-stretching of the muscles and ligaments of the knee. A twisting or bending injury can cause a strain or sprain.  The main symptom is pain that is worse with movement and walking. Swelling can occur. Rarely there may be slight bruising.  Here is some care advice that should help.  Apply a Cold Pack:  Apply a cold pack or  an ice bag (wrapped in a moist towel) to the area for 20 minutes. Repeat in 1 hour, then every 4 hours while awake.  Continue this for the first 48 hours after an injury.  Apply Heat to the Area:  Beginning 48 hours after an injury, apply a warm washcloth or heating pad for 10 minutes three times a day.  This will help increase blood flow and improve healing.  Wrap with an Elastic Bandage:  Wrap the injured part with a snug, elastic bandage for 48 hours.  The pressure from the bandage can make it feel better and help prevent swelling.  If your start to get numbness or tingling of your foot or toes, the bandage may be too tight. Loosen the bandage wrap.  Elevate the Leg:  Lay down and put your leg on a pillow. This puts (elevates) the knee above the heart.  Do this for 15-20 minutes, 2-3 times a day, for the first two days.  Call Back If:  Pain becomes severe  Pain does not improve after 3 days  Pain or swelling lasts more than 2 weeks  You become worse.  Expected Course:  Pain, swelling, and bruising usually start to get better 2 to 3 days after an injury.  Swelling most often is gone after 1 week.  Bruises fade away slowly over 1-2 weeks.  It may take 2 weeks for pain and tenderness of the injured area  to go away.  Patient Will Follow Care Advice:  YES

## 2014-03-20 ENCOUNTER — Ambulatory Visit (INDEPENDENT_AMBULATORY_CARE_PROVIDER_SITE_OTHER): Payer: Medicare Other | Admitting: Psychology

## 2014-03-20 ENCOUNTER — Ambulatory Visit: Payer: Medicare Other | Admitting: Psychology

## 2014-03-20 DIAGNOSIS — M170 Bilateral primary osteoarthritis of knee: Secondary | ICD-10-CM | POA: Diagnosis not present

## 2014-03-20 DIAGNOSIS — F332 Major depressive disorder, recurrent severe without psychotic features: Secondary | ICD-10-CM | POA: Diagnosis not present

## 2014-04-01 DIAGNOSIS — H259 Unspecified age-related cataract: Secondary | ICD-10-CM | POA: Diagnosis not present

## 2014-04-01 DIAGNOSIS — H25811 Combined forms of age-related cataract, right eye: Secondary | ICD-10-CM | POA: Diagnosis not present

## 2014-04-01 DIAGNOSIS — H2512 Age-related nuclear cataract, left eye: Secondary | ICD-10-CM | POA: Diagnosis not present

## 2014-04-01 DIAGNOSIS — H409 Unspecified glaucoma: Secondary | ICD-10-CM | POA: Diagnosis not present

## 2014-04-02 ENCOUNTER — Ambulatory Visit (HOSPITAL_COMMUNITY): Payer: Self-pay | Admitting: Psychiatry

## 2014-04-03 ENCOUNTER — Ambulatory Visit: Payer: Self-pay | Admitting: Internal Medicine

## 2014-04-04 ENCOUNTER — Ambulatory Visit (INDEPENDENT_AMBULATORY_CARE_PROVIDER_SITE_OTHER): Payer: Medicare Other | Admitting: Psychology

## 2014-04-04 DIAGNOSIS — F332 Major depressive disorder, recurrent severe without psychotic features: Secondary | ICD-10-CM | POA: Diagnosis not present

## 2014-04-09 ENCOUNTER — Ambulatory Visit (HOSPITAL_COMMUNITY): Payer: Self-pay | Admitting: Psychiatry

## 2014-04-10 ENCOUNTER — Ambulatory Visit (INDEPENDENT_AMBULATORY_CARE_PROVIDER_SITE_OTHER): Payer: Medicare Other | Admitting: Psychiatry

## 2014-04-10 ENCOUNTER — Encounter (HOSPITAL_COMMUNITY): Payer: Self-pay | Admitting: Psychiatry

## 2014-04-10 DIAGNOSIS — F330 Major depressive disorder, recurrent, mild: Secondary | ICD-10-CM | POA: Diagnosis not present

## 2014-04-10 MED ORDER — LORAZEPAM 0.5 MG PO TABS: 0.5000 mg | ORAL_TABLET | ORAL | Status: DC | PRN

## 2014-04-10 MED ORDER — ESCITALOPRAM OXALATE 10 MG PO TABS: 10.0000 mg | ORAL_TABLET | Freq: Every day | ORAL | Status: DC

## 2014-04-10 NOTE — Progress Notes (Signed)
[place] [phone] Progress Note  [name] [id number] 80 y.o.  04/10/2014 12:01 PM  Chief Complaint:  Medication management and followup.       History presenting illness [name] came for her followup appointment.  She had decided to take Lexapro 10 mg and she realized that she need antidepressant because she started to feel sad and depressed.  She also is scheduled to see Dr. Cheryln Manly for counseling.  Patient was seen by her primary care physician in recent months and her eye physician.  She also went to emergency room because of chronic abdominal pain .  Patient told when she was taking Lexapro 5 mg she was very sad and tearful and depressed.  Since she is back on 10 mg her depression is improved.  She sleeping good.  She denies any recent crying spells.  She had a quiet Thanksgiving but she is going to have a Christmas at her granddaughter's place.  Overall her energy level is improved.  She denies any agitation, anger, mood swing.  She denies any feeling of hopelessness or worthlessness.  Her appetite is okay.  She denies drinking or using any illegal substances.  Patient lives by herself.    Suicidal Ideation: No Plan Formed: No Patient has means to carry out plan: No  Homicidal Ideation: No Plan Formed: No Patient has means to carry out plan: No  Review of Systems: Psychiatric: Agitation: No Hallucination: No Depressed Mood: No Insomnia: No Hypersomnia: No Altered Concentration: No Feels Worthless: No Grandiose Ideas: No Belief In Special Powers: No New/Increased Substance Abuse: No Compulsions: No  Neurologic: Headache: Yes Seizure: No Paresthesias: No Review of Systems  Musculoskeletal: Positive for joint pain.   Medical history Patient has history of hypothyroidism, vitamin D deficiency, hyperlipidemia, hypertension, allergic rhinitis, GERD, and a fissure, cystitis, chronic diarrhea, afibrillation, osteoporosis, chronic fatigue, history of breast  cancer, hearing loss, irritable bladder, chronic knee pain and dysphagia.  Her primary care physician is Dr. Richardson Landry.  Outpatient Encounter Prescriptions as of 04/10/2014  Medication Sig  . allopurinol (ZYLOPRIM) 300 MG tablet TAKE ONE TABLET BY MOUTH ONCE DAILY  . bisoprolol (ZEBETA) 5 MG tablet Take 0.5 tablets (2.5 mg total) by mouth daily.  . Calcium Carbonate-Vit D-Min (CALTRATE 600+D PLUS PO) Take 1 tablet by mouth daily.  . Cetirizine HCl (ZYRTEC ALLERGY) 10 MG CAPS Take 1 capsule by mouth daily as needed.   . Cholecalciferol (VITAMIN D3) 2000 UNITS TABS Take 1 tablet by mouth as needed.   [name] EPIPEN 2-PAK 0.3 MG/0.3ML DEVI Inject 1 Device into the muscle daily as needed. For allergic reaction  . escitalopram (LEXAPRO) 10 MG tablet Take 1 tablet (10 mg total) by mouth daily.  . Garlic 962 MG TABS Take 1 tablet by mouth daily.  [name] levothyroxine (SYNTHROID, LEVOTHROID) 100 MCG tablet Take 100 mcg by mouth daily before breakfast.  . levothyroxine (SYNTHROID, LEVOTHROID) 100 MCG tablet TAKE ONE TABLET BY MOUTH ONCE DAILY  . LORazepam (ATIVAN) 0.5 MG tablet Take 1 tablet (0.5 mg total) by mouth as needed for anxiety.  . magnesium oxide (MAG-OX) 400 MG tablet Take 400 mg by mouth daily.  [name] oxybutynin (DITROPAN-XL) 5 MG 24 hr tablet Take 5 mg by mouth 3 (three) times daily.  [name] PRESCRIPTION MEDICATION Inject 1 each into the skin See admin instructions. January febuary July august pt gets injection week. All other months patient gets patient gets once monthly. Patient is followed by Dr. Harold Hedge. At [facility]. Allergy and asthma  .  Psyllium (METAMUCIL PO) Once daily  . RABEprazole (ACIPHEX) 20 MG tablet Take 1 tablet by mouth 2 (two) times daily before a meal.  . spironolactone (ALDACTONE) 25 MG tablet Take 25 mg by mouth every Monday, Wednesday, and Friday. PRN  . [DISCONTINUED] escitalopram (LEXAPRO) 10 MG tablet Take 5 mg by mouth daily.  . [DISCONTINUED] LORazepam (ATIVAN) 0.5 MG tablet TAKE  ONE TABLET BY MOUTH ONCE DAILY AS NEEDED FOR  ANXIETY    Past Psychiatric History/Hospitalization(s): Anxiety: Yes Bipolar Disorder: No Depression: Yes Mania: No Psychosis: No Schizophrenia: No Personality Disorder: No Hospitalization for psychiatric illness: Yes History of Electroconvulsive Shock Therapy: No Prior Suicide Attempts: No  Physical Exam: Constitutional:  There were no vitals taken for this visit.  Mental status examination Patient is casually dressed and fairly groomed.  She is pleasant and cooperative.  Her speech is clear.  She denies any active or passive suicidal thoughts or homicidal thoughts.  There were no paranoia or delusions present at this time.  She describes her mood as good and her affect is mood appropriate.  Her attention concentration is okay.  She is alert and oriented x3.  Her insight judgment and impulse control is okay.  Established Problem, Stable/Improving (1), Review or order clinical lab tests (1), Review of Last Therapy Session (1), Review of Medication Regimen & Side Effects (2) and Review of New Medication or Change in Dosage (2)  Assessment: Axis I: Maj. depressive disorder  Axis II: Deferred  Axis III: See medical history  Axis IV: Mild to moderate  Axis V: 60-65   Plan:  Patient is now taking Lexapro 10 mg and her depression is improved.  She takes Ativan 0.5 mg as needed for severe anxiety and insomnia.  Patient denies any side effects of medication.  She is also interested to restart her counseling with Dr. Apolonio Schneiders.  Discussed medication side effects and benefits.  Recommended to call us back if she has any question, concern or if she feels worsening of the symptoms.  I will see her again in 3 months.  Berline Semrad T., MD 04/10/2014

## 2014-04-15 ENCOUNTER — Ambulatory Visit: Payer: Medicare Other | Admitting: Psychology

## 2014-04-22 ENCOUNTER — Encounter: Payer: Self-pay | Admitting: Internal Medicine

## 2014-04-22 ENCOUNTER — Ambulatory Visit (INDEPENDENT_AMBULATORY_CARE_PROVIDER_SITE_OTHER): Payer: Medicare Other | Admitting: Internal Medicine

## 2014-04-22 VITALS — BP 132/98 | HR 70 | Ht 64.0 in | Wt 181.2 lb

## 2014-04-22 DIAGNOSIS — I495 Sick sinus syndrome: Secondary | ICD-10-CM

## 2014-04-22 DIAGNOSIS — I480 Paroxysmal atrial fibrillation: Secondary | ICD-10-CM

## 2014-04-22 DIAGNOSIS — Z950 Presence of cardiac pacemaker: Secondary | ICD-10-CM

## 2014-04-22 LAB — MDC_IDC_ENUM_SESS_TYPE_INCLINIC
Battery Remaining Longevity: 47 mo
Brady Statistic AP VS Percent: 82 %
Brady Statistic AS VS Percent: 15 %
Lead Channel Impedance Value: 525 Ohm
Lead Channel Pacing Threshold Amplitude: 0.5 V
Lead Channel Pacing Threshold Amplitude: 0.75 V
Lead Channel Pacing Threshold Pulse Width: 0.4 ms
Lead Channel Pacing Threshold Pulse Width: 0.4 ms
Lead Channel Sensing Intrinsic Amplitude: 4 mV
Lead Channel Setting Pacing Amplitude: 2 V
Lead Channel Setting Pacing Amplitude: 2.5 V
Lead Channel Setting Pacing Pulse Width: 0.4 ms
MDC IDC MSMT BATTERY IMPEDANCE: 1351 Ohm
MDC IDC MSMT BATTERY VOLTAGE: 2.77 V
MDC IDC MSMT LEADCHNL RV IMPEDANCE VALUE: 542 Ohm
MDC IDC SESS DTM: 20151228122741
MDC IDC SET LEADCHNL RV SENSING SENSITIVITY: 2 mV
MDC IDC STAT BRADY AP VP PERCENT: 2 %
MDC IDC STAT BRADY AS VP PERCENT: 1 %

## 2014-04-22 NOTE — Progress Notes (Signed)
Patient Care Team: Cassandria Anger, MD as PCP - General Kathlee Nations, MD (Psychiatry) Mayme Genta, MD (Gastroenterology) Fay Records, MD (Cardiology) Pedro Earls, MD (General Surgery) Hessie Dibble, MD as Consulting Physician (Orthopedic Surgery)   HPI  [name] is a 80 y.o. female seen in followup for orthostatic intolerance, exercise intolerance, and prior pacemaker implantation for sinus node dysfunction.   She complains of dyspnea on exertion Stress echo 2012 normal LV function. She's had no edema. She has no dietary indiscretion. She has no orthopnea or nocturnal dyspnea.   She is doing pretty well   She has been working with her kids on working together in [employer]       Past Medical History      Past Medical History  Diagnosis Date  . Thyroid disease   . Glaucoma   . IBS (irritable bowel syndrome)   . Incontinence   . GERD (gastroesophageal reflux disease)   . Hypertension   . Anxiety   . Depression     Dr Cheryln Manly  . HX: breast cancer   . Osteoarthritis   . Osteopenia   . Bradycardia   . IBS (irritable bowel syndrome)   . Discoid lupus     skin  . Pacemaker     Brady//Chronotropic incompetence with normal pacemaker function  . Full dentures   . Wears hearing aid     both ears  . Hx of echocardiogram     a.  Echocardiogram (03/2011): Mild LVH, EF 40-98%, grade 1 diastolic dysfunction, mild MR, mild to moderate TR, PASP 42;  b. Echo (05/2013):  Mod LVH, EF 65-70%, no WMA, Gr 1 DD, mild MR, mod TR, PASP 39  . Hx of exercise stress test     a. ETT-echocardiogram (06/08/11): Sub-optimal exercise. Test stopped early due to dizziness and hypotension. EF 60%.  Non-diagnostic.;   b. Lexiscan Myoview (06/2013):  Diaphragmatic attenuation, no ischemia, EF 61%.  Low Risk  . Pacemaker     Past Surgical History  Procedure Laterality Date  . Pacemaker placement  2009  . Foot surgery  2004    Left-hammer toes  . Mastectomy  2002    bilat mastectomy-snbx  . Breast lumpectomy  1995    lt -axillary node dissectoin  . Dorsal compartment release  2000    left  . Abdominal hysterectomy    . Eye surgery  2011    cataract-lt  . Knee arthroscopy Left 08/29/2012    Procedure: LEFT KNEE ARTHROSCOPY ;  Surgeon: Hessie Dibble, MD;  Location: [place];  Service: Orthopedics;  Laterality: Left;  . Trigger finger release Right 07/31/2013    Procedure: EXCISE MASS RIGHT INDEX A-1 PLLLEY RELEASE A-1 RIGHT INDEX;  Surgeon: Cammie Sickle., MD;  Location: [place];  Service: Orthopedics;  Laterality: Right;    Current Outpatient Prescriptions  Medication Sig Dispense Refill  . allopurinol (ZYLOPRIM) 300 MG tablet TAKE ONE TABLET BY MOUTH ONCE DAILY 30 tablet 4  . bisoprolol (ZEBETA) 5 MG tablet Take 0.5 tablets (2.5 mg total) by mouth daily. 30 tablet 11  . Calcium Carbonate-Vit D-Min (CALTRATE 600+D PLUS PO) Take 1 tablet by mouth daily.    . Cetirizine HCl (ZYRTEC ALLERGY) 10 MG CAPS Take 1 capsule by mouth daily as needed.     . Cholecalciferol (VITAMIN D3) 2000 UNITS TABS Take 1 tablet by mouth as needed.     [name]  EPIPEN 2-PAK 0.3 MG/0.3ML DEVI Inject 1 Device into the muscle daily as needed. For allergic reaction    . escitalopram (LEXAPRO) 10 MG tablet Take 1 tablet (10 mg total) by mouth daily. 90 tablet 0  . Garlic 481 MG TABS Take 1 tablet by mouth daily.    [name] levothyroxine (SYNTHROID, LEVOTHROID) 100 MCG tablet Take 100 mcg by mouth daily before breakfast.    . LORazepam (ATIVAN) 0.5 MG tablet Take 1 tablet (0.5 mg total) by mouth as needed for anxiety. 30 tablet 0  . magnesium oxide (MAG-OX) 400 MG tablet Take 400 mg by mouth daily.    [name] oxybutynin (DITROPAN-XL) 5 MG 24 hr tablet Take 5 mg by mouth 3 (three) times daily.    . Psyllium (METAMUCIL PO) Once daily    . RABEprazole (ACIPHEX) 20 MG tablet Take 1 tablet by mouth 2 (two) times daily before a meal.    . spironolactone (ALDACTONE)  25 MG tablet Take 25 mg by mouth every Monday, Wednesday, and Friday. PRN     No current facility-administered medications for this visit.    Allergies  Allergen Reactions  . Amlodipine Besy-Benazepril Hcl Swelling  . Amlodipine Besylate Swelling  . Clonidine Hydrochloride     REACTION: tired  . Lactose Intolerance (Gi) Other (See Comments)  . Valsartan Other (See Comments)    Tongue swelling   . Verapamil     Review of Systems negative except from HPI and PMH  Physical Exam BP 132/98 mmHg  Pulse 70  Ht 5\' 4"  (1.626 m)  Wt 181 lb 3.2 oz (82.192 kg)  BMI 31.09 kg/m2 Well developed and well nourished in no acute distress HENT normal E scleral and icterus clear Neck Supple JVP flat; carotids brisk and full Clear to ausculation  Regular rate and rhythm, no murmurs gallops or rub Soft with active bowel sounds No clubbing cyanosis none Edema Alert and oriented, grossly normal motor and sensory function Skin Warm and Dry    Assessment and  Plan  Sinus node dysfunction   Pacemaker  Medtronic  The patient's device was interrogated.  The information was reviewed. No changes were made in the programming.     Hypertension   Stable  Measurements at home in the 120 range    Stable

## 2014-04-22 NOTE — Patient Instructions (Signed)
Your physician wants you to follow-up in: 12 months with Dr. Klein You will receive a reminder letter in the mail two months in advance. If you don't receive a letter, please call our office to schedule the follow-up appointment.  Remote monitoring is used to monitor your Pacemaker or ICD from home. This monitoring reduces the number of office visits required to check your device to one time per year. It allows us to keep an eye on the functioning of your device to ensure it is working properly. You are scheduled for a device check from home on 07/22/14. You may send your transmission at any time that day. If you have a wireless device, the transmission will be sent automatically. After your physician reviews your transmission, you will receive a postcard with your next transmission date.    

## 2014-04-23 ENCOUNTER — Encounter: Payer: Self-pay | Admitting: Internal Medicine

## 2014-04-23 NOTE — Telephone Encounter (Signed)
Spoke with pt and informed her that I spoke with Juanda Crumble in EP and that he had spoke with Dr. Caryl Comes and that Dr. Caryl Comes stated that he wasn't going to make any changes at this time and that he wanted to continue with current plan of doing scheduled pacemaker check in March.  Pt verbalized understanding and agreed to this plan.

## 2014-04-29 ENCOUNTER — Ambulatory Visit (INDEPENDENT_AMBULATORY_CARE_PROVIDER_SITE_OTHER): Payer: Medicare Other | Admitting: Psychology

## 2014-04-29 DIAGNOSIS — F332 Major depressive disorder, recurrent severe without psychotic features: Secondary | ICD-10-CM | POA: Diagnosis not present

## 2014-04-30 ENCOUNTER — Other Ambulatory Visit (HOSPITAL_COMMUNITY): Payer: Self-pay | Admitting: *Deleted

## 2014-04-30 DIAGNOSIS — F330 Major depressive disorder, recurrent, mild: Secondary | ICD-10-CM

## 2014-04-30 NOTE — Telephone Encounter (Signed)
Pt requesting refill of Lorazepam. States is unable to locate bottle, thinks she may have thrown it away with Christmas wrappings. Please advise

## 2014-05-01 ENCOUNTER — Encounter: Payer: Self-pay | Admitting: Internal Medicine

## 2014-05-01 ENCOUNTER — Telehealth (HOSPITAL_COMMUNITY): Payer: Self-pay

## 2014-05-01 ENCOUNTER — Ambulatory Visit (INDEPENDENT_AMBULATORY_CARE_PROVIDER_SITE_OTHER): Payer: Medicare Other | Admitting: Internal Medicine

## 2014-05-01 VITALS — BP 130/82 | HR 81 | Temp 98.2°F | Resp 16 | Ht 64.0 in | Wt 182.0 lb

## 2014-05-01 DIAGNOSIS — I480 Paroxysmal atrial fibrillation: Secondary | ICD-10-CM | POA: Diagnosis not present

## 2014-05-01 DIAGNOSIS — F411 Generalized anxiety disorder: Secondary | ICD-10-CM | POA: Diagnosis not present

## 2014-05-01 DIAGNOSIS — K589 Irritable bowel syndrome without diarrhea: Secondary | ICD-10-CM

## 2014-05-01 NOTE — Assessment & Plan Note (Addendum)
H/o PAF  Dr Lequita Halt Pt will take baby ASA EC if tolerated. She is worried about meds side effects in general

## 2014-05-01 NOTE — Assessment & Plan Note (Signed)
Doing well w/diet

## 2014-05-01 NOTE — Assessment & Plan Note (Signed)
Continue with current prescription therapy as reflected on the Med list.  

## 2014-05-01 NOTE — Progress Notes (Signed)
Pre visit review using our clinic review tool, if applicable. No additional management support is needed unless otherwise documented below in the visit note. 

## 2014-05-01 NOTE — Progress Notes (Signed)
Subjective:     HPI     C/o wt gain  C/o not feeling well - tired  F/u L knee post-op swelling  The patient is here to follow up on chronic IBS worse lately, GERD,  depression, anxiety, SOB symptoms controlled with medicines.  F/u  legs are fatigued. No LBP...  Pt has a h/o PAF  F/u ongoing IBS sx's, stress - anal fissure. She saw Dr Earlean Shawl - endoscopy is planned. She is planning to be seen at [place].    Wt Readings from Last 3 Encounters:  05/01/14 182 lb (82.555 kg)  04/22/14 181 lb 3.2 oz (82.192 kg)  01/28/14 181 lb (82.101 kg)   BP Readings from Last 3 Encounters:  05/01/14 130/82  04/22/14 132/98  01/28/14 130/84      Past Medical History  Diagnosis Date  . Thyroid disease   . Glaucoma   . IBS (irritable bowel syndrome)   . Incontinence   . GERD (gastroesophageal reflux disease)   . Hypertension   . Anxiety   . Depression     Dr Cheryln Manly  . HX: breast cancer   . Osteoarthritis   . Osteopenia   . Bradycardia   . IBS (irritable bowel syndrome)   . Discoid lupus     skin  . Pacemaker     Brady//Chronotropic incompetence with normal pacemaker function  . Full dentures   . Wears hearing aid     both ears  . Hx of echocardiogram     a.  Echocardiogram (03/2011): Mild LVH, EF 28-31%, grade 1 diastolic dysfunction, mild MR, mild to moderate TR, PASP 42;  b. Echo (05/2013):  Mod LVH, EF 65-70%, no WMA, Gr 1 DD, mild MR, mod TR, PASP 39  . Hx of exercise stress test     a. ETT-echocardiogram (06/08/11): Sub-optimal exercise. Test stopped early due to dizziness and hypotension. EF 60%.  Non-diagnostic.;   b. Lexiscan Myoview (06/2013):  Diaphragmatic attenuation, no ischemia, EF 61%.  Low Risk  . Pacemaker    Past Surgical History  Procedure Laterality Date  . Pacemaker placement  2009  . Foot surgery  2004    Left-hammer toes  . Mastectomy  2002    bilat mastectomy-snbx  . Breast lumpectomy  1995    lt -axillary node dissectoin  . Dorsal compartment  release  2000    left  . Abdominal hysterectomy    . Eye surgery  2011    cataract-lt  . Knee arthroscopy Left 08/29/2012    Procedure: LEFT KNEE ARTHROSCOPY ;  Surgeon: Hessie Dibble, MD;  Location: [place];  Service: Orthopedics;  Laterality: Left;  . Trigger finger release Right 07/31/2013    Procedure: EXCISE MASS RIGHT INDEX A-1 PLLLEY RELEASE A-1 RIGHT INDEX;  Surgeon: Cammie Sickle., MD;  Location: [place];  Service: Orthopedics;  Laterality: Right;    reports that she has never smoked. She has never used smokeless tobacco. She reports that she does not drink alcohol or use illicit drugs. family history includes Cancer in her brother; Heart disease in her brother and father; Ovarian cancer in her mother; Stroke in her brother and sister. Allergies  Allergen Reactions  . Amlodipine Besy-Benazepril Hcl Swelling  . Amlodipine Besylate Swelling  . Clonidine Hydrochloride     REACTION: tired  . Lactose Intolerance (Gi) Other (See Comments)  . Valsartan Other (See Comments)    Tongue swelling   . Verapamil  Current Outpatient Prescriptions on File Prior to Visit  Medication Sig Dispense Refill  . allopurinol (ZYLOPRIM) 300 MG tablet TAKE ONE TABLET BY MOUTH ONCE DAILY 30 tablet 4  . bisoprolol (ZEBETA) 5 MG tablet Take 0.5 tablets (2.5 mg total) by mouth daily. 30 tablet 11  . Calcium Carbonate-Vit D-Min (CALTRATE 600+D PLUS PO) Take 1 tablet by mouth daily.    . Cetirizine HCl (ZYRTEC ALLERGY) 10 MG CAPS Take 1 capsule by mouth daily as needed.     . Cholecalciferol (VITAMIN D3) 2000 UNITS TABS Take 1 tablet by mouth as needed.     [name] EPIPEN 2-PAK 0.3 MG/0.3ML DEVI Inject 1 Device into the muscle daily as needed. For allergic reaction    . escitalopram (LEXAPRO) 10 MG tablet Take 1 tablet (10 mg total) by mouth daily. 90 tablet 0  . Garlic 235 MG TABS Take 1 tablet by mouth daily.    [name] levothyroxine (SYNTHROID, LEVOTHROID) 100 MCG tablet  Take 100 mcg by mouth daily before breakfast.    . LORazepam (ATIVAN) 0.5 MG tablet Take 1 tablet (0.5 mg total) by mouth as needed for anxiety. 30 tablet 0  . magnesium oxide (MAG-OX) 400 MG tablet Take 400 mg by mouth daily.    [name] oxybutynin (DITROPAN-XL) 5 MG 24 hr tablet Take 5 mg by mouth 3 (three) times daily.    . Psyllium (METAMUCIL PO) Once daily    . RABEprazole (ACIPHEX) 20 MG tablet Take 1 tablet by mouth 2 (two) times daily before a meal.    . spironolactone (ALDACTONE) 25 MG tablet Take 25 mg by mouth every Monday, Wednesday, and Friday. PRN     No current facility-administered medications on file prior to visit.   Review of Systems Review of Systems  Constitutional: Negative for diaphoresis and unexpected weight change.  HENT: Negative for drooling and tinnitus.   Eyes: Negative for photophobia and visual disturbance.  Respiratory: Negative for choking and stridor.   Gastrointestinal: Negative for vomiting and blood in stool.  Genitourinary: Negative for hematuria and decreased urine volume.  Musculoskeletal: Negative for gait problem.  Skin: Negative for color change and wound.  Neurological: Negative for tremors and numbness.  Psychiatric/Behavioral: Negative for decreased concentration. The patient is not hyperactive.   Knee pain    Objective:   Physical Exam  Cardiovascular: Normal rate and regular rhythm.    BP 130/82 mmHg  Pulse 81  Temp(Src) 98.2 F (36.8 C) (Oral)  Resp 16  Ht 5\' 4"  (1.626 m)  Wt 182 lb (82.555 kg)  BMI 31.22 kg/m2  SpO2 99% Physical Exam  VS noted Constitutional: Pt appears well-developed and well-nourished.  HENT: Head: Normocephalic.  Right Ear: External ear normal.  Left Ear: External ear normal.  Eyes: Conjunctivae and EOM are normal. Pupils are equal, round, and reactive to light.  Neck: Normal range of motion. Neck supple.  Cardiovascular: Normal rate and regular rhythm.   Pulmonary/Chest: Effort normal and breath sounds  normal. no rales or wheezing Abd:  Soft, NT, non-distended, + BS Neurological: Pt is alert. No cranial nerve deficit.  Skin: Skin is warm. No erythema.  Psychiatric: Pt behavior is normal. Thought content normal.  L knee is a little tender to palp CT angio - ok ECHO - OK for age  Lab Results  Component Value Date   WBC 4.6 01/22/2014   HGB 14.1 01/22/2014   HCT 42.7 01/22/2014   PLT 231 01/22/2014   GLUCOSE 105* 01/22/2014   CHOL 174  05/11/2010   TRIG 78.0 05/11/2010   HDL 52.30 05/11/2010   LDLDIRECT 130.8 09/30/2006   LDLCALC 106* 05/11/2010   ALT 12 01/01/2013   AST 19 01/01/2013   NA 138 01/22/2014   K 4.0 01/22/2014   CL 101 01/22/2014   CREATININE 0.83 01/22/2014   BUN 18 01/22/2014   CO2 25 01/22/2014   TSH 0.50 08/23/2013   INR 1.00 12/13/2009   HGBA1C 6.0 04/10/2012       Assessment & Plan:  Patient ID: [name], female   DOB: 1929/04/29, 81 y.o.   MRN: [id number]

## 2014-05-02 ENCOUNTER — Other Ambulatory Visit (HOSPITAL_COMMUNITY): Payer: Self-pay | Admitting: Psychiatry

## 2014-05-02 MED ORDER — LORAZEPAM 0.5 MG PO TABS
0.5000 mg | ORAL_TABLET | ORAL | Status: DC | PRN
Start: 2014-05-02 — End: 2014-06-13

## 2014-05-03 ENCOUNTER — Telehealth: Payer: Self-pay | Admitting: Internal Medicine

## 2014-05-03 ENCOUNTER — Telehealth (HOSPITAL_COMMUNITY): Payer: Self-pay | Admitting: *Deleted

## 2014-05-03 NOTE — Telephone Encounter (Signed)
Pt called stating her mediation was called in yesterday but [facility] wouldn't fill it unless the Doctor called them. This Probation officer called pharmacy to verify Ativan was called in. They have it on file and it will be filled in 2 hours. State they may have confused patient by telling her it was left on automated system. They apologized. Patient was called back to let her know that she could pick it up after 2 hours. Pt very thankful.

## 2014-05-03 NOTE — Telephone Encounter (Signed)
Calling to see if we could give her a few Ativan pill.  Her Psychia

## 2014-05-03 NOTE — Telephone Encounter (Signed)
Continuous from previous note.  Her Psychiatrist, Dr. Adele Schilder gave her a Rx for Ativan but during her cleaning recently she thinks she threw the Rx away.  She has tried to call his office but they haven't returned her call.  States she has started worrying about her AFib that Dr. Caryl Comes told her she had after talking to some of her friends.  Advised we couldn't give her the Ativan and to keep trying to get Dr. Adele Schilder.  She understands.

## 2014-05-03 NOTE — Telephone Encounter (Signed)
New Msg          Pt would like a call back about getting a new prescription written.

## 2014-05-03 NOTE — Telephone Encounter (Signed)
Katrina from [facility] called stating patient's Ativan was too soon for refill but patient told them she had thrown it away by accident. Dr. Adele Schilder had called in prescription 05/02/14 and they just wanted [place] to be aware in case it happened again in the future. Explained that a note would be placed in patient chart for future reference.

## 2014-05-06 DIAGNOSIS — M1712 Unilateral primary osteoarthritis, left knee: Secondary | ICD-10-CM | POA: Diagnosis not present

## 2014-05-13 ENCOUNTER — Ambulatory Visit: Payer: Medicare Other | Admitting: Psychology

## 2014-05-14 ENCOUNTER — Other Ambulatory Visit: Payer: Self-pay | Admitting: Internal Medicine

## 2014-05-15 ENCOUNTER — Telehealth: Payer: Self-pay | Admitting: Internal Medicine

## 2014-05-15 MED ORDER — TRIAMCINOLONE ACETONIDE 0.5 % EX OINT: 1.0000 "application " | TOPICAL_OINTMENT | Freq: Two times a day (BID) | CUTANEOUS | Status: DC | PRN

## 2014-05-15 NOTE — Telephone Encounter (Signed)
Notified pt rx sent to [facility].../lmb 

## 2014-05-15 NOTE — Telephone Encounter (Signed)
IS this ok to refill...[name]

## 2014-05-15 NOTE — Telephone Encounter (Signed)
OK to fill this prescription with additional refills x2 Thank you!  

## 2014-05-15 NOTE — Telephone Encounter (Signed)
Pt called in requesting a refill on triamcinolone ointment (KENALOG) 0.5 % [[id number].  She said that she has not had it in a year but is out and needs more of it.   [facility]  [phone]

## 2014-05-24 ENCOUNTER — Telehealth: Payer: Self-pay | Admitting: Internal Medicine

## 2014-05-24 MED ORDER — CARVEDILOL 12.5 MG PO TABS
12.5000 mg | ORAL_TABLET | Freq: Two times a day (BID) | ORAL | Status: DC
Start: 2014-05-24 — End: 2014-06-06

## 2014-05-24 NOTE — Telephone Encounter (Signed)
Ok Coreg bid - done [employer]

## 2014-05-24 NOTE — Telephone Encounter (Signed)
Left detailed mess informing pt.  

## 2014-05-24 NOTE — Telephone Encounter (Signed)
Is requesting Blood pressure med bisoprolol to be changed to another script.  States her allergist is requesting this because it will interfere with her allergies.   Patient also states that she was taking one pill every other day of the bisoprolol b/c she could not break the tablet in half.

## 2014-05-27 ENCOUNTER — Ambulatory Visit (INDEPENDENT_AMBULATORY_CARE_PROVIDER_SITE_OTHER): Payer: Medicare Other | Admitting: Psychology

## 2014-05-27 DIAGNOSIS — F332 Major depressive disorder, recurrent severe without psychotic features: Secondary | ICD-10-CM | POA: Diagnosis not present

## 2014-05-28 ENCOUNTER — Encounter: Payer: Self-pay | Admitting: Internal Medicine

## 2014-05-28 ENCOUNTER — Other Ambulatory Visit: Payer: Self-pay | Admitting: Internal Medicine

## 2014-06-04 ENCOUNTER — Telehealth: Payer: Self-pay | Admitting: Internal Medicine

## 2014-06-04 NOTE — Telephone Encounter (Signed)
Pt called because she is having a lot of side effects on one of the medications she is on.  She is requesting call back from the nurse

## 2014-06-05 NOTE — Telephone Encounter (Signed)
Spoke with pt. She states Carvedilol seems to be too strong. States it just makes her feel bad (tired) after she takes it. States that she did not take the evening dose yesterday and BP today at 10:30am was 117/80. She took her morning dose of carvedilol at 11am and "started feeling very bad" afterwards. Pt states that she tolerated Atenolol in the past and did not have these side effects. Pt requests to go back on Atenolol.  Please advise.

## 2014-06-06 MED ORDER — ATENOLOL 25 MG PO TABS: 12.5000 mg | ORAL_TABLET | Freq: Two times a day (BID) | ORAL | Status: DC

## 2014-06-06 NOTE — Telephone Encounter (Signed)
Ok Atenolol 12.5 mg/bid Thx

## 2014-06-07 ENCOUNTER — Telehealth: Payer: Self-pay

## 2014-06-07 ENCOUNTER — Encounter: Payer: Self-pay | Admitting: Internal Medicine

## 2014-06-07 NOTE — Telephone Encounter (Signed)
Pt called in about changing the meds.  She wanted to know if you can call her when you get a chance?

## 2014-06-07 NOTE — Telephone Encounter (Signed)
Patient is unhappy with her PCP and doesn't want to continue taking Carvedilol, she wants to be on Pindolol again.  This was stopped by Dr. Melvyn Novas in 2014 secondary to interference with allergy shots.  Patient continues to explain that she isn't on these shots anymore and wants to go back to Pindolol. Advised patient to contact PCP to discuss this issue as this is prescribed for her BP and they are the ones following this. I explained that there may be another reason for not restarting Pindolol and they would be able to explain why/why not.  She is agreeable with reasoning and will call their office to discuss this further.

## 2014-06-07 NOTE — Telephone Encounter (Signed)
Message from Guinevere Ferrari sent at 06/07/2014 10:25 AM EST -----    Patient called wants to go back to taking pindolol 5 mg She states that her pcp put her on carvedilol 12.5 mg bid and it is too strong for her please advise

## 2014-06-10 ENCOUNTER — Ambulatory Visit: Payer: Medicare Other | Admitting: Psychology

## 2014-06-10 MED ORDER — PINDOLOL 5 MG PO TABS
5.0000 mg | ORAL_TABLET | Freq: Two times a day (BID) | ORAL | Status: DC
Start: 2014-06-10 — End: 2014-06-14

## 2014-06-10 NOTE — Telephone Encounter (Signed)
Ok Done Thx 

## 2014-06-10 NOTE — Telephone Encounter (Signed)
Pt informed of med change. She was NOT requesting Atenolol. She is requesting Pindolol 5 mg. See past meds. Please advise.

## 2014-06-11 ENCOUNTER — Other Ambulatory Visit (HOSPITAL_COMMUNITY): Payer: Self-pay | Admitting: Psychiatry

## 2014-06-11 NOTE — Telephone Encounter (Signed)
Medication refill request received from patient's [facility].

## 2014-06-11 NOTE — Telephone Encounter (Signed)
Pt informed

## 2014-06-12 ENCOUNTER — Encounter: Payer: Self-pay | Admitting: Internal Medicine

## 2014-06-12 NOTE — Telephone Encounter (Signed)
No reason she cnat take pindolol from my perspective

## 2014-06-13 ENCOUNTER — Telehealth: Payer: Self-pay | Admitting: *Deleted

## 2014-06-13 ENCOUNTER — Telehealth (HOSPITAL_COMMUNITY): Payer: Self-pay

## 2014-06-13 DIAGNOSIS — F330 Major depressive disorder, recurrent, mild: Secondary | ICD-10-CM

## 2014-06-13 MED ORDER — LORAZEPAM 0.5 MG PO TABS: 0.5000 mg | ORAL_TABLET | ORAL | Status: DC | PRN

## 2014-06-13 NOTE — Telephone Encounter (Signed)
Telephone call from patient requesting a refill of her Lorazepam.  Patient's next evaluation set for 07/10/14 and medication last filled 05/02/14.  Dr. Adele Schilder authorized medication refill.  Called in order to [facility] on [address].  Called Ms. Juncaj back and informed one time order was called in to her [place] and requested she call back if any difficulties getting filled.  Will see patient at evaluation 07/10/14.

## 2014-06-13 NOTE — Telephone Encounter (Signed)
From  [name]   To  Cassandria Anger, MD   Sent  06/12/2014 11:35 AM      Hello DR  Thank you for the Rx for the Pindolol. I only need one 5MG  daily. The Rx says 5MG  twice a day, but you only authorized 30 pills.You are the DR. WHAT SHALL I DO? I rather take one a day.  Thank you.

## 2014-06-14 MED ORDER — PINDOLOL 5 MG PO TABS: 5.0000 mg | ORAL_TABLET | Freq: Every day | ORAL | Status: DC

## 2014-06-14 NOTE — Telephone Encounter (Signed)
Pls take 1 po qd - corrected Thx

## 2014-06-14 NOTE — Telephone Encounter (Signed)
Pt informed

## 2014-06-18 ENCOUNTER — Ambulatory Visit (INDEPENDENT_AMBULATORY_CARE_PROVIDER_SITE_OTHER): Payer: Medicare Other | Admitting: Psychology

## 2014-06-18 DIAGNOSIS — F332 Major depressive disorder, recurrent severe without psychotic features: Secondary | ICD-10-CM | POA: Diagnosis not present

## 2014-06-24 ENCOUNTER — Ambulatory Visit: Payer: BLUE CROSS/BLUE SHIELD | Admitting: Psychology

## 2014-06-26 DIAGNOSIS — M1711 Unilateral primary osteoarthritis, right knee: Secondary | ICD-10-CM | POA: Diagnosis not present

## 2014-06-26 DIAGNOSIS — M1712 Unilateral primary osteoarthritis, left knee: Secondary | ICD-10-CM | POA: Diagnosis not present

## 2014-07-01 ENCOUNTER — Ambulatory Visit (INDEPENDENT_AMBULATORY_CARE_PROVIDER_SITE_OTHER): Payer: Medicare Other | Admitting: Psychology

## 2014-07-01 ENCOUNTER — Telehealth (HOSPITAL_COMMUNITY): Payer: Self-pay

## 2014-07-01 DIAGNOSIS — F332 Major depressive disorder, recurrent severe without psychotic features: Secondary | ICD-10-CM | POA: Diagnosis not present

## 2014-07-01 NOTE — Telephone Encounter (Signed)
Patient called stating she has been attending her therapy appointments with Dr. Cheryln Manly and he suggested she call to report she was not currently taking her prescribed Lexapro.  Patient reported when she was cleaning out medication she recently threw her Lexapro away by mistake but states "I am doing fine without it" and "I don't think I need it".  Patient stated "I see Dr. Adele Schilder on 3/16" and is calling at the advice of Dr. Cheryln Manly to make sure he is aware patient is currently no longer taking her prescribed antidepressant.  Patient denied any suicidal or homicidal ideations, reports sleep is "fine" and denies any other symptoms of depression.  Patient states she is still taking her Lorazepam as needed but again does not feel she needs to remain on the Lexapro and wanted to inform Dr. Adele Schilder.  Patient stated she plans to keep appointment 07/10/14 and can discuss it more with Dr. Adele Schilder that date but requests he call back earlier if feels she needs to continue but would need a new order at this point due to discarding by mistake.

## 2014-07-08 ENCOUNTER — Ambulatory Visit: Payer: BLUE CROSS/BLUE SHIELD | Admitting: Psychology

## 2014-07-10 ENCOUNTER — Ambulatory Visit (HOSPITAL_COMMUNITY): Payer: Self-pay | Admitting: Psychiatry

## 2014-07-11 ENCOUNTER — Ambulatory Visit (INDEPENDENT_AMBULATORY_CARE_PROVIDER_SITE_OTHER): Payer: Medicare Other | Admitting: Psychiatry

## 2014-07-11 ENCOUNTER — Encounter (HOSPITAL_COMMUNITY): Payer: Self-pay | Admitting: Psychiatry

## 2014-07-11 VITALS — BP 134/84 | HR 63 | Ht 64.0 in | Wt 183.0 lb

## 2014-07-11 DIAGNOSIS — F330 Major depressive disorder, recurrent, mild: Secondary | ICD-10-CM

## 2014-07-11 MED ORDER — ESCITALOPRAM OXALATE 10 MG PO TABS: 10.0000 mg | ORAL_TABLET | Freq: Every day | ORAL | Status: DC

## 2014-07-11 MED ORDER — LORAZEPAM 0.5 MG PO TABS: 0.5000 mg | ORAL_TABLET | ORAL | Status: DC | PRN

## 2014-07-11 NOTE — Progress Notes (Signed)
[place] [phone] Progress Note  [name] [id number] 81 y.o.  07/11/2014 2:50 PM  Chief Complaint:  Medication management and followup.       History presenting illness [name] came for her followup appointment.  She accidentally lost her Lexapro and has been not taking the past few weeks.  She is complaining of increased anxiety, crying spells, poor sleep and depressive thoughts.  She is taking Ativan to help her nervousness but sometimes she feels it is not working.  She wants to go back on Lexapro which was helping her depressive symptoms.  She reported Lexapro was helping her depression and her energy level.  Patient denies any suicidal thoughts or homicidal thought.  She lives by herself.  She has no paranoia or any hallucination.  Her appetite is okay.  Her vitals are stable.  She is seeing Dr. Cheryln Manly for counseling.  Suicidal Ideation: No Plan Formed: No Patient has means to carry out plan: No  Homicidal Ideation: No Plan Formed: No Patient has means to carry out plan: No  Review of Systems: Psychiatric: Agitation: No Hallucination: No Depressed Mood: Yes Insomnia: Yes Hypersomnia: No Altered Concentration: No Feels Worthless: No Grandiose Ideas: No Belief In Special Powers: No New/Increased Substance Abuse: No Compulsions: No  Neurologic: Headache: Yes Seizure: No Paresthesias: No ROS Medical history Patient has history of hypothyroidism, vitamin D deficiency, hyperlipidemia, hypertension, allergic rhinitis, GERD, and a fissure, cystitis, chronic diarrhea, afibrillation, osteoporosis, chronic fatigue, history of breast cancer, hearing loss, irritable bladder, chronic knee pain and dysphagia.  Her primary care physician is Dr. Richardson Landry.  Outpatient Encounter Prescriptions as of 07/11/2014  Medication Sig  . allopurinol (ZYLOPRIM) 300 MG tablet TAKE ONE TABLET BY MOUTH ONCE DAILY  . Calcium Carbonate-Vit D-Min (CALTRATE 600+D PLUS PO) Take 1 tablet  by mouth daily.  . Cetirizine HCl (ZYRTEC ALLERGY) 10 MG CAPS Take 1 capsule by mouth daily as needed.   . Cholecalciferol (VITAMIN D3) 2000 UNITS TABS Take 1 tablet by mouth as needed.   [name] EPIPEN 2-PAK 0.3 MG/0.3ML DEVI Inject 1 Device into the muscle daily as needed. For allergic reaction  . escitalopram (LEXAPRO) 10 MG tablet Take 1 tablet (10 mg total) by mouth daily.  . Garlic 144 MG TABS Take 1 tablet by mouth daily.  [name] levothyroxine (SYNTHROID, LEVOTHROID) 100 MCG tablet TAKE ONE TABLET BY MOUTH ONCE DAILY  . LORazepam (ATIVAN) 0.5 MG tablet Take 1 tablet (0.5 mg total) by mouth as needed for anxiety.  . magnesium oxide (MAG-OX) 400 MG tablet Take 400 mg by mouth daily.  [name] oxybutynin (DITROPAN-XL) 5 MG 24 hr tablet Take 5 mg by mouth 3 (three) times daily.  . pindolol (VISKEN) 5 MG tablet Take 1 tablet (5 mg total) by mouth daily.  . Psyllium (METAMUCIL PO) Once daily  . RABEprazole (ACIPHEX) 20 MG tablet Take 1 tablet by mouth 2 (two) times daily before a meal.  . spironolactone (ALDACTONE) 25 MG tablet Take 25 mg by mouth every Monday, Wednesday, and Friday. PRN  . triamcinolone ointment (KENALOG) 0.5 % Apply 1 application topically 2 (two) times daily as needed. Apply to affected area twice a day as needed  . [DISCONTINUED] escitalopram (LEXAPRO) 10 MG tablet Take 1 tablet (10 mg total) by mouth daily.  . [DISCONTINUED] LORazepam (ATIVAN) 0.5 MG tablet Take 1 tablet (0.5 mg total) by mouth as needed for anxiety.    Past Psychiatric History/Hospitalization(s): Anxiety: Yes Bipolar Disorder: No Depression: Yes Mania: No Psychosis: No Schizophrenia:  No Personality Disorder: No Hospitalization for psychiatric illness: Yes History of Electroconvulsive Shock Therapy: No Prior Suicide Attempts: No  Physical Exam: Constitutional:  BP 134/84 mmHg  Pulse 63  Ht 5\' 4"  (1.626 m)  Wt 183 lb (83.008 kg)  BMI 31.40 kg/m2  Mental status examination Patient is casually dressed and  fairly groomed.   she is anxious and nervous.  She described her mood sad depressed and her affect is constricted.  Her speech is soft clear and coherent.  She denies any active or passive suicidal thoughts or homicidal thoughts.  There were no paranoia or delusions present at this time.   her psychomotor activity is slow.  Her fund of knowledge is good.  Her attention concentration is okay.  She is alert and oriented x3.  Her insight judgment and impulse control is okay.  Established Problem, Stable/Improving (1), Review and summation of old records (2), Review of Last Therapy Session (1) and Review of Medication Regimen & Side Effects (2)  Assessment: Axis I: Maj. depressive disorder  Axis II: Deferred  Axis III: See medical history  Plan:  reassurance given.  We will restart Lexapro 10 mg to help her depression and Ativan 0.5 mg as needed for anxiety.  Discussed medication side effects and benefits.  Recommended to keep appointment with Dr. Cheryln Manly for counseling.  She has no tremors or shakes.  I will see her again in 3 months however recommended to call us back if she has any question, concern if he feel worsening of the symptom.  ARFEEN,SYED T., MD 07/11/2014

## 2014-07-12 ENCOUNTER — Telehealth: Payer: Self-pay | Admitting: Internal Medicine

## 2014-07-12 NOTE — Telephone Encounter (Signed)
Spoke with pt. She reports pain in center of her back for several weeks.  Pain comes and goes.  No chest pain.  I instructed pt to contact primary care to have this pain evaluated.  She is agreeable with this plan.

## 2014-07-12 NOTE — Telephone Encounter (Signed)
New Message        Pt calling stating that she is having pain in her upper back and shoulder area and wants to know if it is something she should be concerned about. Please call back and advise.

## 2014-07-15 ENCOUNTER — Ambulatory Visit (INDEPENDENT_AMBULATORY_CARE_PROVIDER_SITE_OTHER): Payer: Medicare Other | Admitting: Psychology

## 2014-07-15 DIAGNOSIS — F332 Major depressive disorder, recurrent severe without psychotic features: Secondary | ICD-10-CM

## 2014-07-16 ENCOUNTER — Encounter: Payer: Self-pay | Admitting: Internal Medicine

## 2014-07-18 ENCOUNTER — Other Ambulatory Visit: Payer: Self-pay | Admitting: Internal Medicine

## 2014-07-18 DIAGNOSIS — E039 Hypothyroidism, unspecified: Secondary | ICD-10-CM

## 2014-07-22 ENCOUNTER — Ambulatory Visit (INDEPENDENT_AMBULATORY_CARE_PROVIDER_SITE_OTHER): Payer: Medicare Other | Admitting: Psychology

## 2014-07-22 ENCOUNTER — Ambulatory Visit (INDEPENDENT_AMBULATORY_CARE_PROVIDER_SITE_OTHER): Payer: Medicare Other | Admitting: *Deleted

## 2014-07-22 DIAGNOSIS — F332 Major depressive disorder, recurrent severe without psychotic features: Secondary | ICD-10-CM | POA: Diagnosis not present

## 2014-07-22 DIAGNOSIS — I495 Sick sinus syndrome: Secondary | ICD-10-CM | POA: Diagnosis not present

## 2014-07-22 NOTE — Progress Notes (Signed)
Remote pacemaker transmission.   

## 2014-07-23 LAB — MDC_IDC_ENUM_SESS_TYPE_REMOTE
Battery Impedance: 1552 Ohm
Battery Remaining Longevity: 41 mo
Brady Statistic AS VP Percent: 1 %
Brady Statistic AS VS Percent: 17 %
Date Time Interrogation Session: 20160328114026
Lead Channel Impedance Value: 528 Ohm
Lead Channel Pacing Threshold Amplitude: 0.625 V
Lead Channel Pacing Threshold Pulse Width: 0.4 ms
Lead Channel Sensing Intrinsic Amplitude: 5.6 mV
Lead Channel Setting Pacing Amplitude: 2 V
Lead Channel Setting Sensing Sensitivity: 2 mV
MDC IDC MSMT BATTERY VOLTAGE: 2.77 V
MDC IDC MSMT LEADCHNL RA IMPEDANCE VALUE: 503 Ohm
MDC IDC MSMT LEADCHNL RV PACING THRESHOLD AMPLITUDE: 0.5 V
MDC IDC MSMT LEADCHNL RV PACING THRESHOLD PULSEWIDTH: 0.4 ms
MDC IDC SET LEADCHNL RV PACING AMPLITUDE: 2.5 V
MDC IDC SET LEADCHNL RV PACING PULSEWIDTH: 0.4 ms
MDC IDC STAT BRADY AP VP PERCENT: 2 %
MDC IDC STAT BRADY AP VS PERCENT: 81 %

## 2014-07-30 ENCOUNTER — Other Ambulatory Visit (INDEPENDENT_AMBULATORY_CARE_PROVIDER_SITE_OTHER): Payer: Medicare Other

## 2014-07-30 DIAGNOSIS — E039 Hypothyroidism, unspecified: Secondary | ICD-10-CM | POA: Diagnosis not present

## 2014-07-30 LAB — CBC WITH DIFFERENTIAL/PLATELET
BASOS PCT: 0.3 % (ref 0.0–3.0)
Basophils Absolute: 0 10*3/uL (ref 0.0–0.1)
EOS PCT: 3.2 % (ref 0.0–5.0)
Eosinophils Absolute: 0.1 10*3/uL (ref 0.0–0.7)
HEMATOCRIT: 40.8 % (ref 36.0–46.0)
HEMOGLOBIN: 13.8 g/dL (ref 12.0–15.0)
Lymphocytes Relative: 27.1 % (ref 12.0–46.0)
Lymphs Abs: 1.1 10*3/uL (ref 0.7–4.0)
MCHC: 33.7 g/dL (ref 30.0–36.0)
MCV: 97.5 fl (ref 78.0–100.0)
MONOS PCT: 10 % (ref 3.0–12.0)
Monocytes Absolute: 0.4 10*3/uL (ref 0.1–1.0)
NEUTROS PCT: 59.4 % (ref 43.0–77.0)
Neutro Abs: 2.5 10*3/uL (ref 1.4–7.7)
Platelets: 227 10*3/uL (ref 150.0–400.0)
RBC: 4.19 Mil/uL (ref 3.87–5.11)
RDW: 14.9 % (ref 11.5–15.5)
WBC: 4.2 10*3/uL (ref 4.0–10.5)

## 2014-07-30 LAB — T4, FREE: Free T4: 1.17 ng/dL (ref 0.60–1.60)

## 2014-07-30 LAB — BASIC METABOLIC PANEL
BUN: 18 mg/dL (ref 6–23)
CHLORIDE: 100 meq/L (ref 96–112)
CO2: 29 meq/L (ref 19–32)
CREATININE: 0.97 mg/dL (ref 0.40–1.20)
Calcium: 9.3 mg/dL (ref 8.4–10.5)
GFR: 70.62 mL/min (ref >60.00)
Glucose, Bld: 95 mg/dL (ref 70–99)
Potassium: 4.3 mEq/L (ref 3.5–5.1)
Sodium: 134 mEq/L — ABNORMAL LOW (ref 135–145)

## 2014-07-30 LAB — TSH: TSH: 0.38 u[IU]/mL (ref 0.35–4.50)

## 2014-07-31 ENCOUNTER — Encounter: Payer: Self-pay | Admitting: Internal Medicine

## 2014-07-31 ENCOUNTER — Encounter: Payer: Self-pay | Admitting: Cardiology

## 2014-07-31 ENCOUNTER — Ambulatory Visit (INDEPENDENT_AMBULATORY_CARE_PROVIDER_SITE_OTHER): Payer: Medicare Other | Admitting: Internal Medicine

## 2014-07-31 VITALS — BP 138/89 | HR 79 | Wt 185.0 lb

## 2014-07-31 DIAGNOSIS — I119 Hypertensive heart disease without heart failure: Secondary | ICD-10-CM

## 2014-07-31 DIAGNOSIS — E038 Other specified hypothyroidism: Secondary | ICD-10-CM | POA: Diagnosis not present

## 2014-07-31 DIAGNOSIS — Z950 Presence of cardiac pacemaker: Secondary | ICD-10-CM

## 2014-07-31 DIAGNOSIS — M159 Polyosteoarthritis, unspecified: Secondary | ICD-10-CM

## 2014-07-31 DIAGNOSIS — M150 Primary generalized (osteo)arthritis: Secondary | ICD-10-CM | POA: Diagnosis not present

## 2014-07-31 DIAGNOSIS — M25561 Pain in right knee: Secondary | ICD-10-CM

## 2014-07-31 DIAGNOSIS — M8949 Other hypertrophic osteoarthropathy, multiple sites: Secondary | ICD-10-CM

## 2014-07-31 DIAGNOSIS — M25562 Pain in left knee: Secondary | ICD-10-CM

## 2014-07-31 DIAGNOSIS — E034 Atrophy of thyroid (acquired): Secondary | ICD-10-CM

## 2014-07-31 NOTE — Progress Notes (Signed)
Subjective:     HPI     C/o wt gain  F/u not feeling well - tired  F/u L knee post-op swelling - better  The patient is here to follow up on chronic IBS worse lately, GERD,  depression, anxiety, SOB symptoms controlled with medicines.  F/u  legs are fatigued. No LBP...  Pt has a h/o PAF  F/u ongoing IBS sx's, stress - anal fissure. She saw Dr Earlean Shawl - endoscopy is planned. She is planning to be seen at [facility] by her GI again some day.    Wt Readings from Last 3 Encounters:  07/31/14 185 lb (83.915 kg)  07/11/14 183 lb (83.008 kg)  05/01/14 182 lb (82.555 kg)   BP Readings from Last 3 Encounters:  07/31/14 148/90  07/11/14 134/84  05/01/14 130/82      Past Medical History  Diagnosis Date  . Thyroid disease   . Glaucoma   . IBS (irritable bowel syndrome)   . Incontinence   . GERD (gastroesophageal reflux disease)   . Hypertension   . Anxiety   . Depression     Dr Cheryln Manly  . HX: breast cancer   . Osteoarthritis   . Osteopenia   . Bradycardia   . IBS (irritable bowel syndrome)   . Discoid lupus     skin  . Pacemaker     Brady//Chronotropic incompetence with normal pacemaker function  . Full dentures   . Wears hearing aid     both ears  . Hx of echocardiogram     a.  Echocardiogram (03/2011): Mild LVH, EF 96-75%, grade 1 diastolic dysfunction, mild MR, mild to moderate TR, PASP 42;  b. Echo (05/2013):  Mod LVH, EF 65-70%, no WMA, Gr 1 DD, mild MR, mod TR, PASP 39  . Hx of exercise stress test     a. ETT-echocardiogram (06/08/11): Sub-optimal exercise. Test stopped early due to dizziness and hypotension. EF 60%.  Non-diagnostic.;   b. Lexiscan Myoview (06/2013):  Diaphragmatic attenuation, no ischemia, EF 61%.  Low Risk  . Pacemaker    Past Surgical History  Procedure Laterality Date  . Pacemaker placement  2009  . Foot surgery  2004    Left-hammer toes  . Mastectomy  2002    bilat mastectomy-snbx  . Breast lumpectomy  1995    lt -axillary node  dissectoin  . Dorsal compartment release  2000    left  . Abdominal hysterectomy    . Eye surgery  2011    cataract-lt  . Knee arthroscopy Left 08/29/2012    Procedure: LEFT KNEE ARTHROSCOPY ;  Surgeon: Hessie Dibble, MD;  Location: [place];  Service: Orthopedics;  Laterality: Left;  . Trigger finger release Right 07/31/2013    Procedure: EXCISE MASS RIGHT INDEX A-1 PLLLEY RELEASE A-1 RIGHT INDEX;  Surgeon: Cammie Sickle., MD;  Location: [place];  Service: Orthopedics;  Laterality: Right;    reports that she has never smoked. She has never used smokeless tobacco. She reports that she does not drink alcohol or use illicit drugs. family history includes Cancer in her brother; Heart disease in her brother and father; Ovarian cancer in her mother; Stroke in her brother and sister. Allergies  Allergen Reactions  . Amlodipine Besy-Benazepril Hcl Swelling  . Amlodipine Besylate Swelling  . Clonidine Hydrochloride     REACTION: tired  . Lactose Intolerance (Gi) Other (See Comments)  . Valsartan Other (See Comments)    Tongue swelling   .  Verapamil    Current Outpatient Prescriptions on File Prior to Visit  Medication Sig Dispense Refill  . allopurinol (ZYLOPRIM) 300 MG tablet TAKE ONE TABLET BY MOUTH ONCE DAILY 30 tablet 4  . Calcium Carbonate-Vit D-Min (CALTRATE 600+D PLUS PO) Take 1 tablet by mouth daily.    . Cetirizine HCl (ZYRTEC ALLERGY) 10 MG CAPS Take 1 capsule by mouth daily as needed.     . Cholecalciferol (VITAMIN D3) 2000 UNITS TABS Take 1 tablet by mouth as needed.     [name] EPIPEN 2-PAK 0.3 MG/0.3ML DEVI Inject 1 Device into the muscle daily as needed. For allergic reaction    . escitalopram (LEXAPRO) 10 MG tablet Take 1 tablet (10 mg total) by mouth daily. 90 tablet 0  . Garlic 244 MG TABS Take 1 tablet by mouth daily.    [name] levothyroxine (SYNTHROID, LEVOTHROID) 100 MCG tablet TAKE ONE TABLET BY MOUTH ONCE DAILY 90 tablet 3  . LORazepam  (ATIVAN) 0.5 MG tablet Take 1 tablet (0.5 mg total) by mouth as needed for anxiety. 30 tablet 0  . magnesium oxide (MAG-OX) 400 MG tablet Take 400 mg by mouth daily.    [name] oxybutynin (DITROPAN-XL) 5 MG 24 hr tablet Take 5 mg by mouth 3 (three) times daily.    . pindolol (VISKEN) 5 MG tablet Take 1 tablet (5 mg total) by mouth daily. 30 tablet 11  . Psyllium (METAMUCIL PO) Once daily    . RABEprazole (ACIPHEX) 20 MG tablet Take 1 tablet by mouth 2 (two) times daily before a meal.    . spironolactone (ALDACTONE) 25 MG tablet Take 25 mg by mouth every Monday, Wednesday, and Friday. PRN    . triamcinolone ointment (KENALOG) 0.5 % Apply 1 application topically 2 (two) times daily as needed. Apply to affected area twice a day as needed 30 g 2   No current facility-administered medications on file prior to visit.   Review of Systems Review of Systems  Constitutional: Negative for diaphoresis and unexpected weight change.  HENT: Negative for drooling and tinnitus.   Eyes: Negative for photophobia and visual disturbance.  Respiratory: Negative for choking and stridor.   Gastrointestinal: Negative for vomiting and blood in stool.  Genitourinary: Negative for hematuria and decreased urine volume.  Musculoskeletal: Negative for gait problem.  Skin: Negative for color change and wound.  Neurological: Negative for tremors and numbness.  Psychiatric/Behavioral: Negative for decreased concentration. The patient is not hyperactive.   Knee pain    Objective:   Physical Exam  Cardiovascular: Normal rate and regular rhythm.    BP 148/90 mmHg  Pulse 79  Wt 185 lb (83.915 kg)  SpO2 95% Physical Exam  VS noted. Obese. Constitutional: Pt appears well-developed and well-nourished.  HENT: Head: Normocephalic.  Right Ear: External ear normal.  Left Ear: External ear normal.  Eyes: Conjunctivae and EOM are normal. Pupils are equal, round, and reactive to light.  Neck: Normal range of motion. Neck supple.   Cardiovascular: Normal rate and regular rhythm.   Pulmonary/Chest: Effort normal and breath sounds normal. no rales or wheezing Abd:  Soft, NT, non-distended, + BS Neurological: Pt is alert. No cranial nerve deficit.  Skin: Skin is warm. No erythema.  Psychiatric: Pt behavior is normal. Thought content normal.  B knee are a little tender to palp CT angio - ok ECHO - OK for age  Lab Results  Component Value Date   WBC 4.2 07/30/2014   HGB 13.8 07/30/2014   HCT 40.8 07/30/2014  PLT 227.0 07/30/2014   GLUCOSE 95 07/30/2014   CHOL 174 05/11/2010   TRIG 78.0 05/11/2010   HDL 52.30 05/11/2010   LDLDIRECT 130.8 09/30/2006   LDLCALC 106* 05/11/2010   ALT 12 01/01/2013   AST 19 01/01/2013   NA 134* 07/30/2014   K 4.3 07/30/2014   CL 100 07/30/2014   CREATININE 0.97 07/30/2014   BUN 18 07/30/2014   CO2 29 07/30/2014   TSH 0.38 07/30/2014   INR 1.00 12/13/2009   HGBA1C 6.0 04/10/2012       Assessment & Plan:

## 2014-07-31 NOTE — Assessment & Plan Note (Signed)
Doing well 

## 2014-07-31 NOTE — Progress Notes (Signed)
Pre visit review using our clinic review tool, if applicable. No additional management support is needed unless otherwise documented below in the visit note. 

## 2014-07-31 NOTE — Assessment & Plan Note (Signed)
B anserina bursitis/OA B knees OA Pt is ok to have steroid knee injections Dr Rhona Raider  Handicapped papers DMV

## 2014-07-31 NOTE — Assessment & Plan Note (Signed)
B knees Pt is ok to have steroid knee injections Dr Rhona Raider

## 2014-07-31 NOTE — Assessment & Plan Note (Signed)
Chronic On Levothroid - 

## 2014-07-31 NOTE — Assessment & Plan Note (Addendum)
On Pindolol and Spironolactone

## 2014-08-03 ENCOUNTER — Telehealth: Payer: Self-pay | Admitting: Physician Assistant

## 2014-08-03 NOTE — Telephone Encounter (Signed)
[name] is a 81 y.o. female with hx of SSS s/p PPM. She called in with fluttering sensation in her L ear. She notes relief with putting hand over her heart. She denies palpitations, chest pain, SOB, syncope, fever, congestion, cough.  I have asked her to go to urgent care if this gets worse.  She may need her oto canal and TM examined.  Otherwise, I will have the EP clinic arrange a remote check on her Pacer on Monday 08/05/14.  She agreed with this plan. Richardson Dopp, PA-C   08/03/2014 4:04 PM

## 2014-08-05 ENCOUNTER — Ambulatory Visit (INDEPENDENT_AMBULATORY_CARE_PROVIDER_SITE_OTHER): Payer: Medicare Other | Admitting: Psychology

## 2014-08-05 DIAGNOSIS — F332 Major depressive disorder, recurrent severe without psychotic features: Secondary | ICD-10-CM

## 2014-08-05 NOTE — Telephone Encounter (Signed)
Spoke w/pt to advise to send transmission. Transmission will be reviewed and pt will be called back.

## 2014-08-05 NOTE — Telephone Encounter (Signed)
LMOM for return call//kwm  

## 2014-08-06 NOTE — Telephone Encounter (Signed)
Transmission received--normal pacer check. Pt had 1 atrial high rate on 4-10 lasting 50 seconds. No other episodes. Pt was instructed to call PCP if continues.

## 2014-08-08 ENCOUNTER — Encounter: Payer: Self-pay | Admitting: Internal Medicine

## 2014-08-10 ENCOUNTER — Encounter: Payer: Self-pay | Admitting: Family Medicine

## 2014-08-10 ENCOUNTER — Ambulatory Visit (INDEPENDENT_AMBULATORY_CARE_PROVIDER_SITE_OTHER): Payer: Medicare Other | Admitting: Family Medicine

## 2014-08-10 VITALS — BP 120/80 | Temp 98.4°F | Wt 185.0 lb

## 2014-08-10 DIAGNOSIS — H6982 Other specified disorders of Eustachian tube, left ear: Secondary | ICD-10-CM

## 2014-08-10 NOTE — Progress Notes (Signed)
   Subjective:    Patient ID: [name], female    DOB: 1929/11/29, 81 y.o.   MRN: [id number]  HPI Here for one week of intermittent "fluttering" in the left ear. No pain or change in hearing. She does have some allergy issues and takes Zyrtec daily. Some sinus congestion and sneezing.    Review of Systems  Constitutional: Negative.   HENT: Positive for congestion, sinus pressure and sneezing. Negative for ear discharge, ear pain, hearing loss, postnasal drip and rhinorrhea.   Eyes: Negative.   Respiratory: Negative.        Objective:   Physical Exam  Constitutional: She appears well-developed and well-nourished.  HENT:  Right Ear: External ear normal.  Left Ear: External ear normal.  Nose: Nose normal.  Mouth/Throat: Oropharynx is clear and moist. No oropharyngeal exudate.  Eyes: Conjunctivae are normal.  Lymphadenopathy:    She has no cervical adenopathy.          Assessment & Plan:  Add Mucinex prn

## 2014-08-10 NOTE — Progress Notes (Signed)
Pre visit review using our clinic review tool, if applicable. No additional management support is needed unless otherwise documented below in the visit note. 

## 2014-08-19 ENCOUNTER — Ambulatory Visit (INDEPENDENT_AMBULATORY_CARE_PROVIDER_SITE_OTHER): Payer: Medicare Other | Admitting: Psychology

## 2014-08-19 ENCOUNTER — Other Ambulatory Visit (HOSPITAL_COMMUNITY): Payer: Self-pay | Admitting: Psychiatry

## 2014-08-19 DIAGNOSIS — F330 Major depressive disorder, recurrent, mild: Secondary | ICD-10-CM

## 2014-08-19 DIAGNOSIS — M170 Bilateral primary osteoarthritis of knee: Secondary | ICD-10-CM | POA: Diagnosis not present

## 2014-08-19 DIAGNOSIS — F332 Major depressive disorder, recurrent severe without psychotic features: Secondary | ICD-10-CM

## 2014-08-21 ENCOUNTER — Other Ambulatory Visit (HOSPITAL_COMMUNITY): Payer: Self-pay | Admitting: Psychiatry

## 2014-08-21 NOTE — Telephone Encounter (Signed)
New order for patient's requested Lorazepam called in to patient's [place] on [facility].  Called [name] to inform her prescription was at her pharmacy for her to pick up and she will call back if any problems getting refill.

## 2014-08-21 NOTE — Telephone Encounter (Signed)
Dr. Adele Schilder,   Patient request refill on Ativan.  Patient last seen 07-11-14.  Ativan was restarted on 07-11-14. Patient due to follow up 10-14-14.  Patient last filled on 07-11-14.  Is it okay to refill?

## 2014-08-21 NOTE — Telephone Encounter (Signed)
Okay to refill #30 

## 2014-08-22 NOTE — Telephone Encounter (Signed)
Declined refill request for patient's Ativan from [facility] as request was already taken care of on 08/21/14.

## 2014-09-02 ENCOUNTER — Ambulatory Visit (INDEPENDENT_AMBULATORY_CARE_PROVIDER_SITE_OTHER): Payer: Medicare Other | Admitting: Psychology

## 2014-09-02 DIAGNOSIS — F332 Major depressive disorder, recurrent severe without psychotic features: Secondary | ICD-10-CM | POA: Diagnosis not present

## 2014-09-16 ENCOUNTER — Ambulatory Visit (INDEPENDENT_AMBULATORY_CARE_PROVIDER_SITE_OTHER): Payer: Medicare Other | Admitting: Psychology

## 2014-09-16 DIAGNOSIS — F332 Major depressive disorder, recurrent severe without psychotic features: Secondary | ICD-10-CM | POA: Diagnosis not present

## 2014-09-18 ENCOUNTER — Telehealth: Payer: Self-pay | Admitting: Internal Medicine

## 2014-09-18 NOTE — Telephone Encounter (Signed)
Left mess for patient to call back.  

## 2014-09-18 NOTE — Telephone Encounter (Signed)
Patient called and is needing her lab work from 4/5 explained to her. Please call pt

## 2014-09-19 NOTE — Telephone Encounter (Signed)
Pt called back in  °

## 2014-09-19 NOTE — Telephone Encounter (Signed)
Called pt again. No answer. Will try again later.

## 2014-09-24 ENCOUNTER — Other Ambulatory Visit (HOSPITAL_COMMUNITY): Payer: Self-pay | Admitting: Psychiatry

## 2014-09-24 ENCOUNTER — Other Ambulatory Visit: Payer: Self-pay | Admitting: Internal Medicine

## 2014-09-26 NOTE — Telephone Encounter (Signed)
Medication refill request for Lorazepam.  Last filled 08/21/14 with no refills.  Returns to see Dr. Adele Schilder on 10/14/14 and patient states she is "doing fine" and taking medication as is prescribed but now needs a refill.

## 2014-09-30 ENCOUNTER — Other Ambulatory Visit (HOSPITAL_COMMUNITY): Payer: Self-pay | Admitting: Psychiatry

## 2014-09-30 ENCOUNTER — Ambulatory Visit: Payer: Medicare Other | Admitting: Psychology

## 2014-09-30 DIAGNOSIS — F330 Major depressive disorder, recurrent, mild: Secondary | ICD-10-CM

## 2014-10-01 NOTE — Telephone Encounter (Signed)
Met with Dr. Adele Schilder who authorized one time refill of patient's prescribed Ativan and called in one time refill order to patient's [place] on [facility].  Patient to be seen for further refills and is scheduled to return to see Dr. Adele Schilder on 10/14/14.  Left patient a message new order was called into her [place] and reminded patient of need to keep appointment on 10/14/14.

## 2014-10-14 ENCOUNTER — Encounter (HOSPITAL_COMMUNITY): Payer: Self-pay | Admitting: Psychiatry

## 2014-10-14 ENCOUNTER — Ambulatory Visit (INDEPENDENT_AMBULATORY_CARE_PROVIDER_SITE_OTHER): Payer: Medicare Other | Admitting: Psychology

## 2014-10-14 ENCOUNTER — Telehealth: Payer: Self-pay | Admitting: Internal Medicine

## 2014-10-14 ENCOUNTER — Ambulatory Visit (INDEPENDENT_AMBULATORY_CARE_PROVIDER_SITE_OTHER): Payer: Medicare Other | Admitting: Psychiatry

## 2014-10-14 VITALS — BP 136/86 | HR 76 | Ht 64.0 in | Wt 183.4 lb

## 2014-10-14 DIAGNOSIS — F332 Major depressive disorder, recurrent severe without psychotic features: Secondary | ICD-10-CM | POA: Diagnosis not present

## 2014-10-14 DIAGNOSIS — F330 Major depressive disorder, recurrent, mild: Secondary | ICD-10-CM

## 2014-10-14 DIAGNOSIS — F329 Major depressive disorder, single episode, unspecified: Secondary | ICD-10-CM

## 2014-10-14 MED ORDER — ESCITALOPRAM OXALATE 10 MG PO TABS: 10.0000 mg | ORAL_TABLET | Freq: Every day | ORAL | Status: DC

## 2014-10-14 MED ORDER — LORAZEPAM 0.5 MG PO TABS: ORAL_TABLET | ORAL | Status: DC

## 2014-10-14 NOTE — Telephone Encounter (Signed)
OK w/me Thx 

## 2014-10-14 NOTE — Telephone Encounter (Signed)
Pt request to transfer from [facility] to [facility]. Please advise.

## 2014-10-14 NOTE — Progress Notes (Signed)
[facility] [phone] Progress Note  [name] [id number] 81 y.o.  10/14/2014 2:58 PM  Chief Complaint:  Medication management and followup.       History presenting illness Marsa came for her followup appointment.  She is taking her medication as prescribed.  She denies any anxiety or panic attack but she is concerned because she is taking too many medications for her health needs.  She is hoping her medicine can be cut down in the future.  She sleeping good.  She denies any irritability, anger or any mood swing.  She denies any crying spells.  She likes Lexapro and Ativan which she takes at bedtime.  Patient denies any feeling of hopelessness or worthlessness.  Her energy level is okay.  She denies any paranoia or any hallucination.  She lives by herself.  Her appetite is okay.  Her vitals are stable.  She is seeing Dr. Cheryln Manly for counseling.  Suicidal Ideation: No Plan Formed: No Patient has means to carry out plan: No  Homicidal Ideation: No Plan Formed: No Patient has means to carry out plan: No  Review of Systems: Psychiatric: Agitation: No Hallucination: No Depressed Mood: No Insomnia: No Hypersomnia: No Altered Concentration: No Feels Worthless: No Grandiose Ideas: No Belief In Special Powers: No New/Increased Substance Abuse: No Compulsions: No  Neurologic: Headache: No Seizure: No Paresthesias: No ROS Medical history Patient has history of hypothyroidism, vitamin D deficiency, hyperlipidemia, hypertension, allergic rhinitis, GERD, and a fissure, cystitis, chronic diarrhea, afibrillation, osteoporosis, chronic fatigue, history of breast cancer, hearing loss, irritable bladder, chronic knee pain and dysphagia.  Her primary care physician is Dr. Richardson Landry.  Outpatient Encounter Prescriptions as of 10/14/2014  Medication Sig  . escitalopram (LEXAPRO) 10 MG tablet Take 1 tablet (10 mg total) by mouth daily.  [name] LORazepam (ATIVAN) 0.5 MG tablet TAKE ONE  TABLET BY MOUTH AS NEEDED FOR ANXIETY  . [DISCONTINUED] escitalopram (LEXAPRO) 10 MG tablet Take 1 tablet (10 mg total) by mouth daily.  . [DISCONTINUED] LORazepam (ATIVAN) 0.5 MG tablet TAKE ONE TABLET BY MOUTH AS NEEDED FOR ANXIETY  . allopurinol (ZYLOPRIM) 300 MG tablet TAKE ONE TABLET BY MOUTH ONCE DAILY  . Calcium Carbonate-Vit D-Min (CALTRATE 600+D PLUS PO) Take 1 tablet by mouth daily.  . Cetirizine HCl (ZYRTEC ALLERGY) 10 MG CAPS Take 1 capsule by mouth daily as needed.   . Cholecalciferol (VITAMIN D3) 2000 UNITS TABS Take 1 tablet by mouth as needed.   [name] EPIPEN 2-PAK 0.3 MG/0.3ML DEVI Inject 1 Device into the muscle daily as needed. For allergic reaction  . Garlic 297 MG TABS Take 1 tablet by mouth daily.  [name] Kelp 150 MCG TABS Take 150 mg by mouth daily.  [name] levothyroxine (SYNTHROID, LEVOTHROID) 100 MCG tablet TAKE ONE TABLET BY MOUTH ONCE DAILY  . magnesium oxide (MAG-OX) 400 MG tablet Take 400 mg by mouth daily.  [name] olopatadine (PATANOL) 0.1 % ophthalmic solution 1 drop 2 (two) times daily.  [name] oxybutynin (DITROPAN) 5 MG tablet TAKE ONE TABLET BY MOUTH THREE TIMES DAILY  . pindolol (VISKEN) 5 MG tablet Take 1 tablet (5 mg total) by mouth daily.  . Psyllium (METAMUCIL PO) Once daily  . RABEprazole (ACIPHEX) 20 MG tablet Take 1 tablet by mouth 2 (two) times daily before a meal.  . spironolactone (ALDACTONE) 25 MG tablet Take 25 mg by mouth every Monday, Wednesday, and Friday. PRN  . triamcinolone ointment (KENALOG) 0.5 % Apply 1 application topically 2 (two) times daily as needed. Apply  to affected area twice a day as needed  . [DISCONTINUED] oxybutynin (DITROPAN-XL) 5 MG 24 hr tablet Take 5 mg by mouth 3 (three) times daily.   No facility-administered encounter medications on file as of 10/14/2014.    Past Psychiatric History/Hospitalization(s): Anxiety: Yes Bipolar Disorder: No Depression: Yes Mania: No Psychosis: No Schizophrenia: No Personality Disorder: No Hospitalization for  psychiatric illness: Yes History of Electroconvulsive Shock Therapy: No Prior Suicide Attempts: No  Physical Exam: Constitutional:  BP 136/86 mmHg  Pulse 76  Ht 5\' 4"  (1.626 m)  Wt 183 lb 6.4 oz (83.19 kg)  BMI 31.47 kg/m2  Mental status examination Patient is casually dressed and fairly groomed.   She is pleasant and cooperative.  She maintained fair eye contact.  She described her mood euthymic and her affect is appropriate.  Her speech is soft clear and coherent.  She denies any active or passive suicidal thoughts or homicidal thoughts.  There were no paranoia or delusions present at this time.   her psychomotor activity is slow.  Her fund of knowledge is good.  Her attention concentration is okay.  She is alert and oriented x3.  Her insight judgment and impulse control is okay.  Established Problem, Stable/Improving (1), Review and summation of old records (2), Review of Last Therapy Session (1) and Review of Medication Regimen & Side Effects (2)  Assessment: Axis I: Maj. depressive disorder  Axis II: Deferred  Axis III: See medical history  Plan:  Reassurance given.  Patient will discuss with her primary care physician to see if some of the medication she can reduce or stop for her health needs.  I will continue 0.5 mg Ativan at bedtime and Lexapro 10 mg daily.  She has no side effects including any tremors or shakes.  Discussed medication side effects and benefits.  Recommended to keep appointment with Dr. Cheryln Manly for counseling.  I will see her again in 3 months however recommended to call us back if she has any question, concern if he feel worsening of the symptom.  ARFEEN,SYED T., MD 10/14/2014

## 2014-10-15 NOTE — Telephone Encounter (Signed)
Dr. Doug Sou please advise.

## 2014-10-17 ENCOUNTER — Other Ambulatory Visit: Payer: Self-pay | Admitting: Internal Medicine

## 2014-10-17 ENCOUNTER — Encounter: Payer: Self-pay | Admitting: Internal Medicine

## 2014-10-21 ENCOUNTER — Encounter: Payer: Self-pay | Admitting: Internal Medicine

## 2014-10-21 ENCOUNTER — Other Ambulatory Visit: Payer: Self-pay

## 2014-10-21 ENCOUNTER — Ambulatory Visit (INDEPENDENT_AMBULATORY_CARE_PROVIDER_SITE_OTHER): Payer: Medicare Other | Admitting: *Deleted

## 2014-10-21 DIAGNOSIS — I495 Sick sinus syndrome: Secondary | ICD-10-CM | POA: Diagnosis not present

## 2014-10-21 NOTE — Progress Notes (Signed)
Remote pacemaker transmission.   

## 2014-10-22 ENCOUNTER — Encounter: Payer: Self-pay | Admitting: Internal Medicine

## 2014-10-22 ENCOUNTER — Ambulatory Visit (INDEPENDENT_AMBULATORY_CARE_PROVIDER_SITE_OTHER): Payer: Medicare Other | Admitting: Internal Medicine

## 2014-10-22 ENCOUNTER — Other Ambulatory Visit (INDEPENDENT_AMBULATORY_CARE_PROVIDER_SITE_OTHER): Payer: Medicare Other

## 2014-10-22 VITALS — BP 138/86 | HR 60 | Temp 98.3°F | Wt 183.0 lb

## 2014-10-22 DIAGNOSIS — K602 Anal fissure, unspecified: Secondary | ICD-10-CM

## 2014-10-22 DIAGNOSIS — K921 Melena: Secondary | ICD-10-CM

## 2014-10-22 LAB — CBC WITH DIFFERENTIAL/PLATELET
Basophils Absolute: 0 10*3/uL (ref 0.0–0.1)
Basophils Relative: 0.3 % (ref 0.0–3.0)
EOS PCT: 2.5 % (ref 0.0–5.0)
Eosinophils Absolute: 0.1 10*3/uL (ref 0.0–0.7)
HEMATOCRIT: 40.6 % (ref 36.0–46.0)
Hemoglobin: 13.5 g/dL (ref 12.0–15.0)
LYMPHS ABS: 1 10*3/uL (ref 0.7–4.0)
Lymphocytes Relative: 23.9 % (ref 12.0–46.0)
MCHC: 33.4 g/dL (ref 30.0–36.0)
MCV: 99 fl (ref 78.0–100.0)
Monocytes Absolute: 0.3 10*3/uL (ref 0.1–1.0)
Monocytes Relative: 8.2 % (ref 3.0–12.0)
Neutro Abs: 2.6 10*3/uL (ref 1.4–7.7)
Neutrophils Relative %: 65.1 % (ref 43.0–77.0)
Platelets: 212 10*3/uL (ref 150.0–400.0)
RBC: 4.1 Mil/uL (ref 3.87–5.11)
RDW: 15.3 % (ref 11.5–15.5)
WBC: 4 10*3/uL (ref 4.0–10.5)

## 2014-10-22 LAB — PROTIME-INR
INR: 1 ratio (ref 0.8–1.0)
Prothrombin Time: 11.1 s (ref 9.6–13.1)

## 2014-10-22 NOTE — Progress Notes (Signed)
Subjective:     Rectal Bleeding  The current episode started yesterday. The onset was sudden. The problem occurs rarely. The patient is experiencing no pain. The stool is described as hard. Pertinent negatives include no abdominal pain and no diarrhea.  H/o fissures (Dr Earlean Shawl)    F/u not feeling well - tired  F/u L knee post-op swelling - better  The patient is here to follow up on chronic IBS worse lately, GERD,  depression, anxiety, SOB symptoms controlled with medicines.  F/u  legs are fatigued. No LBP...  Pt has a h/o PAF - now on ASA 81 mg/d  F/u ongoing IBS sx's, stress - anal fissure. She saw Dr Earlean Shawl - endoscopy is planned. She is planning to be seen at [facility] by her GI again some day.    Wt Readings from Last 3 Encounters:  10/22/14 183 lb (83.008 kg)  10/14/14 183 lb 6.4 oz (83.19 kg)  08/10/14 185 lb (83.915 kg)   BP Readings from Last 3 Encounters:  10/22/14 138/86  10/14/14 136/86  08/10/14 120/80      Past Medical History  Diagnosis Date  . Thyroid disease   . Glaucoma   . IBS (irritable bowel syndrome)   . Incontinence   . GERD (gastroesophageal reflux disease)   . Hypertension   . Anxiety   . Depression     Dr Cheryln Manly  . HX: breast cancer   . Osteoarthritis   . Osteopenia   . Bradycardia   . IBS (irritable bowel syndrome)   . Discoid lupus     skin  . Pacemaker     Brady//Chronotropic incompetence with normal pacemaker function  . Full dentures   . Wears hearing aid     both ears  . Hx of echocardiogram     a.  Echocardiogram (03/2011): Mild LVH, EF 10-93%, grade 1 diastolic dysfunction, mild MR, mild to moderate TR, PASP 42;  b. Echo (05/2013):  Mod LVH, EF 65-70%, no WMA, Gr 1 DD, mild MR, mod TR, PASP 39  . Hx of exercise stress test     a. ETT-echocardiogram (06/08/11): Sub-optimal exercise. Test stopped early due to dizziness and hypotension. EF 60%.  Non-diagnostic.;   b. Lexiscan Myoview (06/2013):  Diaphragmatic attenuation, no  ischemia, EF 61%.  Low Risk  . Pacemaker    Past Surgical History  Procedure Laterality Date  . Pacemaker placement  2009  . Foot surgery  2004    Left-hammer toes  . Mastectomy  2002    bilat mastectomy-snbx  . Breast lumpectomy  1995    lt -axillary node dissectoin  . Dorsal compartment release  2000    left  . Abdominal hysterectomy    . Eye surgery  2011    cataract-lt  . Knee arthroscopy Left 08/29/2012    Procedure: LEFT KNEE ARTHROSCOPY ;  Surgeon: Hessie Dibble, MD;  Location: [facility];  Service: Orthopedics;  Laterality: Left;  . Trigger finger release Right 07/31/2013    Procedure: EXCISE MASS RIGHT INDEX A-1 PLLLEY RELEASE A-1 RIGHT INDEX;  Surgeon: Cammie Sickle., MD;  Location: [facility];  Service: Orthopedics;  Laterality: Right;    reports that she has never smoked. She has never used smokeless tobacco. She reports that she does not drink alcohol or use illicit drugs. family history includes Cancer in her brother; Heart disease in her brother and father; Ovarian cancer in her mother; Stroke in her brother and sister.  Allergies  Allergen Reactions  . Amlodipine Besy-Benazepril Hcl Swelling  . Amlodipine Besylate Swelling  . Clonidine Hydrochloride     REACTION: tired  . Lactose Intolerance (Gi) Other (See Comments)  . Valsartan Other (See Comments)    Tongue swelling   . Verapamil    Current Outpatient Prescriptions on File Prior to Visit  Medication Sig Dispense Refill  . allopurinol (ZYLOPRIM) 300 MG tablet TAKE ONE TABLET BY MOUTH ONCE DAILY 30 tablet 4  . Cetirizine HCl (ZYRTEC ALLERGY) 10 MG CAPS Take 1 capsule by mouth daily as needed.     . Cholecalciferol (VITAMIN D3) 2000 UNITS TABS Take 1 tablet by mouth as needed.     [name] EPIPEN 2-PAK 0.3 MG/0.3ML DEVI Inject 1 Device into the muscle daily as needed. For allergic reaction    . escitalopram (LEXAPRO) 10 MG tablet Take 1 tablet (10 mg total) by mouth daily. 90 tablet 0   . Kelp 150 MCG TABS Take 150 mg by mouth daily.    [name] levothyroxine (SYNTHROID, LEVOTHROID) 100 MCG tablet TAKE ONE TABLET BY MOUTH ONCE DAILY 90 tablet 3  . LORazepam (ATIVAN) 0.5 MG tablet TAKE ONE TABLET BY MOUTH AS NEEDED FOR ANXIETY 30 tablet 2  . magnesium oxide (MAG-OX) 400 MG tablet Take 400 mg by mouth daily.    [name] oxybutynin (DITROPAN) 5 MG tablet TAKE ONE TABLET BY MOUTH THREE TIMES DAILY 90 tablet 5  . pindolol (VISKEN) 5 MG tablet Take 1 tablet (5 mg total) by mouth daily. 30 tablet 11  . Psyllium (METAMUCIL PO) Once daily    . RABEprazole (ACIPHEX) 20 MG tablet Take 1 tablet by mouth 2 (two) times daily before a meal.    . spironolactone (ALDACTONE) 25 MG tablet Take 25 mg by mouth every Monday, Wednesday, and Friday. PRN    . spironolactone (ALDACTONE) 25 MG tablet TAKE ONE TABLET BY MOUTH ONCE DAILY 30 tablet 11  . Calcium Carbonate-Vit D-Min (CALTRATE 600+D PLUS PO) Take 1 tablet by mouth daily.    . Garlic 353 MG TABS Take 1 tablet by mouth daily.    [name] olopatadine (PATANOL) 0.1 % ophthalmic solution 1 drop 2 (two) times daily.    [name] triamcinolone ointment (KENALOG) 0.5 % Apply 1 application topically 2 (two) times daily as needed. Apply to affected area twice a day as needed (Patient not taking: Reported on 10/22/2014) 30 g 2   No current facility-administered medications on file prior to visit.   Review of Systems  Gastrointestinal: Positive for hematochezia. Negative for abdominal pain and diarrhea.   Review of Systems  Constitutional: Negative for diaphoresis and unexpected weight change.  HENT: Negative for drooling and tinnitus.   Eyes: Negative for photophobia and visual disturbance.  Respiratory: Negative for choking and stridor.   Gastrointestinal: Negative for vomiting and blood in stool.  Genitourinary: Negative for hematuria and decreased urine volume.  Musculoskeletal: Negative for gait problem.  Skin: Negative for color change and wound.  Neurological:  Negative for tremors and numbness.  Psychiatric/Behavioral: Negative for decreased concentration. The patient is not hyperactive.   Knee pain    Objective:   Physical Exam  Cardiovascular: Normal rate and regular rhythm.    BP 138/86 mmHg  Pulse 60  Temp(Src) 98.3 F (36.8 C) (Oral)  Wt 183 lb (83.008 kg)  SpO2 98% Physical Exam  VS noted. Obese. Constitutional: Pt appears well-developed and well-nourished.  HENT: Head: Normocephalic.  Right Ear: External ear normal.  Left Ear: External ear  normal.  Eyes: Conjunctivae and EOM are normal. Pupils are equal, round, and reactive to light.  Neck: Normal range of motion. Neck supple.  Cardiovascular: Normal rate and regular rhythm.   Pulmonary/Chest: Effort normal and breath sounds normal. no rales or wheezing Abd:  Soft, NT, non-distended, + BS Neurological: Pt is alert. No cranial nerve deficit.  Skin: Skin is warm. No erythema.  Psychiatric: Pt behavior is normal. Thought content normal.  B knee are a little tender to palp CT angio - ok ECHO - OK for age  Pt refused rectal exam/anoscopy  Lab Results  Component Value Date   WBC 4.2 07/30/2014   HGB 13.8 07/30/2014   HCT 40.8 07/30/2014   PLT 227.0 07/30/2014   GLUCOSE 95 07/30/2014   CHOL 174 05/11/2010   TRIG 78.0 05/11/2010   HDL 52.30 05/11/2010   LDLDIRECT 130.8 09/30/2006   LDLCALC 106* 05/11/2010   ALT 12 01/01/2013   AST 19 01/01/2013   NA 134* 07/30/2014   K 4.3 07/30/2014   CL 100 07/30/2014   CREATININE 0.97 07/30/2014   BUN 18 07/30/2014   CO2 29 07/30/2014   TSH 0.38 07/30/2014   INR 1.00 12/13/2009   HGBA1C 6.0 04/10/2012       Assessment & Plan:

## 2014-10-22 NOTE — Assessment & Plan Note (Signed)
On topical Rx prn - Kenalog

## 2014-10-22 NOTE — Assessment & Plan Note (Addendum)
6/16 x1 episode - poss due to a fissure vs other GI ref to Dr Earlean Shawl - pt said they can see her tomorrow Pt refused rectal exam/anoscopy

## 2014-10-22 NOTE — Progress Notes (Signed)
Pre visit review using our clinic review tool, if applicable. No additional management support is needed unless otherwise documented below in the visit note. 

## 2014-10-22 NOTE — Telephone Encounter (Signed)
Pt request to cancel this request.

## 2014-10-22 NOTE — Patient Instructions (Signed)
Go to ER if worse 

## 2014-10-24 LAB — CUP PACEART REMOTE DEVICE CHECK
Battery Impedance: 1641 Ohm
Battery Voltage: 2.77 V
Brady Statistic AP VP Percent: 1 %
Brady Statistic AS VP Percent: 0 %
Brady Statistic AS VS Percent: 15 %
Date Time Interrogation Session: 20160627125618
Lead Channel Impedance Value: 526 Ohm
Lead Channel Pacing Threshold Pulse Width: 0.4 ms
Lead Channel Pacing Threshold Pulse Width: 0.4 ms
Lead Channel Setting Pacing Amplitude: 2 V
Lead Channel Setting Pacing Amplitude: 2.5 V
MDC IDC MSMT BATTERY REMAINING LONGEVITY: 39 mo
MDC IDC MSMT LEADCHNL RA IMPEDANCE VALUE: 503 Ohm
MDC IDC MSMT LEADCHNL RA PACING THRESHOLD AMPLITUDE: 0.625 V
MDC IDC MSMT LEADCHNL RV PACING THRESHOLD AMPLITUDE: 0.625 V
MDC IDC MSMT LEADCHNL RV SENSING INTR AMPL: 5.6 mV
MDC IDC SET LEADCHNL RV PACING PULSEWIDTH: 0.4 ms
MDC IDC SET LEADCHNL RV SENSING SENSITIVITY: 2 mV
MDC IDC STAT BRADY AP VS PERCENT: 84 %

## 2014-10-24 NOTE — Telephone Encounter (Signed)
Fine to stay with [facility]

## 2014-11-04 ENCOUNTER — Ambulatory Visit (INDEPENDENT_AMBULATORY_CARE_PROVIDER_SITE_OTHER): Payer: Medicare Other | Admitting: Internal Medicine

## 2014-11-04 ENCOUNTER — Encounter: Payer: Self-pay | Admitting: Internal Medicine

## 2014-11-04 VITALS — BP 124/74 | HR 64 | Wt 179.0 lb

## 2014-11-04 DIAGNOSIS — K589 Irritable bowel syndrome without diarrhea: Secondary | ICD-10-CM

## 2014-11-04 DIAGNOSIS — E034 Atrophy of thyroid (acquired): Secondary | ICD-10-CM

## 2014-11-04 DIAGNOSIS — K602 Anal fissure, unspecified: Secondary | ICD-10-CM

## 2014-11-04 DIAGNOSIS — E038 Other specified hypothyroidism: Secondary | ICD-10-CM | POA: Diagnosis not present

## 2014-11-04 NOTE — Progress Notes (Signed)
Subjective:  Patient ID: [name], female    DOB: 1930/02/23  Age: 81 y.o. MRN: [id number]  CC: No chief complaint on file.   HPI [name] presents for rectal bleeding - resolved. C/o obesity - I need to loose weight... F/u hypothyroidism   Outpatient Prescriptions Prior to Visit  Medication Sig Dispense Refill  . allopurinol (ZYLOPRIM) 300 MG tablet TAKE ONE TABLET BY MOUTH ONCE DAILY 30 tablet 4  . Calcium Carbonate-Vit D-Min (CALTRATE 600+D PLUS PO) Take 1 tablet by mouth daily.    . Cetirizine HCl (ZYRTEC ALLERGY) 10 MG CAPS Take 1 capsule by mouth daily as needed.     . Cholecalciferol (VITAMIN D3) 2000 UNITS TABS Take 1 tablet by mouth as needed.     [name] EPIPEN 2-PAK 0.3 MG/0.3ML DEVI Inject 1 Device into the muscle daily as needed. For allergic reaction    . escitalopram (LEXAPRO) 10 MG tablet Take 1 tablet (10 mg total) by mouth daily. 90 tablet 0  . Garlic 427 MG TABS Take 1 tablet by mouth daily.    [name] Kelp 150 MCG TABS Take 150 mg by mouth daily.    [name] levothyroxine (SYNTHROID, LEVOTHROID) 100 MCG tablet TAKE ONE TABLET BY MOUTH ONCE DAILY 90 tablet 3  . LORazepam (ATIVAN) 0.5 MG tablet TAKE ONE TABLET BY MOUTH AS NEEDED FOR ANXIETY 30 tablet 2  . magnesium oxide (MAG-OX) 400 MG tablet Take 400 mg by mouth daily.    [name] olopatadine (PATANOL) 0.1 % ophthalmic solution 1 drop 2 (two) times daily.    [name] oxybutynin (DITROPAN) 5 MG tablet TAKE ONE TABLET BY MOUTH THREE TIMES DAILY 90 tablet 5  . pindolol (VISKEN) 5 MG tablet Take 1 tablet (5 mg total) by mouth daily. 30 tablet 11  . Psyllium (METAMUCIL PO) Once daily    . RABEprazole (ACIPHEX) 20 MG tablet Take 1 tablet by mouth 2 (two) times daily before a meal.    . spironolactone (ALDACTONE) 25 MG tablet TAKE ONE TABLET BY MOUTH ONCE DAILY 30 tablet 11  . triamcinolone ointment (KENALOG) 0.5 % Apply 1 application topically 2 (two) times daily as needed. Apply to affected area twice a day as needed 30 g 2   No  facility-administered medications prior to visit.    ROS Review of Systems  Constitutional: Negative for chills, activity change, appetite change, fatigue and unexpected weight change.  HENT: Negative for congestion, mouth sores and sinus pressure.   Eyes: Negative for visual disturbance.  Respiratory: Negative for cough and chest tightness.   Gastrointestinal: Negative for nausea, abdominal pain, blood in stool and rectal pain.  Genitourinary: Negative for frequency, decreased urine volume, difficulty urinating and vaginal pain.  Musculoskeletal: Negative for back pain and gait problem.  Skin: Negative for pallor and rash.  Neurological: Negative for dizziness, tremors, weakness, numbness and headaches.  Psychiatric/Behavioral: Negative for suicidal ideas, confusion and sleep disturbance.    Objective:  BP 124/74 mmHg  Pulse 64  Wt 179 lb (81.194 kg)  SpO2 96%  BP Readings from Last 3 Encounters:  11/04/14 124/74  10/22/14 138/86  10/14/14 136/86    Wt Readings from Last 3 Encounters:  11/04/14 179 lb (81.194 kg)  10/22/14 183 lb (83.008 kg)  10/14/14 183 lb 6.4 oz (83.19 kg)    Physical Exam  Constitutional: She appears well-developed. No distress.  HENT:  Head: Normocephalic.  Right Ear: External ear normal.  Left Ear: External ear normal.  Nose: Nose normal.  Mouth/Throat: Oropharynx  is clear and moist.  Eyes: Conjunctivae are normal. Pupils are equal, round, and reactive to light. Right eye exhibits no discharge. Left eye exhibits no discharge.  Neck: Normal range of motion. Neck supple. No JVD present. No tracheal deviation present. No thyromegaly present.  Cardiovascular: Normal rate, regular rhythm and normal heart sounds.   Pulmonary/Chest: No stridor. No respiratory distress. She has no wheezes.  Abdominal: Soft. Bowel sounds are normal. She exhibits no distension and no mass. There is no tenderness. There is no rebound and no guarding.  Musculoskeletal: She  exhibits no edema or tenderness.  Lymphadenopathy:    She has no cervical adenopathy.  Neurological: She displays normal reflexes. No cranial nerve deficit. She exhibits normal muscle tone. Coordination normal.  Skin: No rash noted. No erythema.  Psychiatric: She has a normal mood and affect. Her behavior is normal. Judgment and thought content normal.    Lab Results  Component Value Date   WBC 4.0 10/22/2014   HGB 13.5 10/22/2014   HCT 40.6 10/22/2014   PLT 212.0 10/22/2014   GLUCOSE 95 07/30/2014   CHOL 174 05/11/2010   TRIG 78.0 05/11/2010   HDL 52.30 05/11/2010   LDLDIRECT 130.8 09/30/2006   LDLCALC 106* 05/11/2010   ALT 12 01/01/2013   AST 19 01/01/2013   NA 134* 07/30/2014   K 4.3 07/30/2014   CL 100 07/30/2014   CREATININE 0.97 07/30/2014   BUN 18 07/30/2014   CO2 29 07/30/2014   TSH 0.38 07/30/2014   INR 1.0 10/22/2014   HGBA1C 6.0 04/10/2012    Dg Chest 2 View  01/22/2014   CLINICAL DATA:  Chest pain and shortness of breath  EXAM: CHEST  2 VIEW  COMPARISON:  07/02/2013  FINDINGS: A pacing device is again noted. The cardiac shadow is stable. Postoperative changes are noted on the left. The lungs are well aerated without focal infiltrate or sizable effusion. No acute bony abnormality is seen.  IMPRESSION: Postoperative change without acute abnormality.   Electronically Signed   By: Inez Catalina M.D.   On: 01/22/2014 14:22    Assessment & Plan:   Diagnoses and all orders for this visit:  IBS (irritable bowel syndrome)  Anal fissure  Hypothyroidism due to acquired atrophy of thyroid  I am having Ms. Marinello maintain her Cetirizine HCl, EPIPEN 2-PAK, Psyllium (METAMUCIL PO), RABEprazole, magnesium oxide, Calcium Carbonate-Vit D-Min (CALTRATE 600+D PLUS PO), Vitamin D3, Garlic, allopurinol, levothyroxine, triamcinolone ointment, pindolol, olopatadine, Kelp, oxybutynin, escitalopram, LORazepam, and spironolactone.  No orders of the defined types were placed in this  encounter.     Follow-up: No Follow-up on file.  Walker Kehr, MD

## 2014-11-04 NOTE — Assessment & Plan Note (Signed)
Info on Cytomel given

## 2014-11-04 NOTE — Progress Notes (Signed)
Pre visit review using our clinic review tool, if applicable. No additional management support is needed unless otherwise documented below in the visit note. 

## 2014-11-04 NOTE — Assessment & Plan Note (Signed)
On a good diet Gluten free most of the time

## 2014-11-04 NOTE — Assessment & Plan Note (Signed)
Dr Earlean Shawl Healed - no bleeding

## 2014-11-10 ENCOUNTER — Other Ambulatory Visit: Payer: Self-pay | Admitting: Internal Medicine

## 2014-11-11 ENCOUNTER — Other Ambulatory Visit: Payer: Self-pay

## 2014-11-11 ENCOUNTER — Ambulatory Visit (INDEPENDENT_AMBULATORY_CARE_PROVIDER_SITE_OTHER): Payer: Medicare Other | Admitting: Psychology

## 2014-11-11 DIAGNOSIS — F332 Major depressive disorder, recurrent severe without psychotic features: Secondary | ICD-10-CM | POA: Diagnosis not present

## 2014-11-11 MED ORDER — ALLOPURINOL 300 MG PO TABS: 300.0000 mg | ORAL_TABLET | Freq: Every day | ORAL | Status: DC

## 2014-11-11 NOTE — Telephone Encounter (Signed)
Should be refilled by ordering/following provider. (not sure why this was refilled under Dr. Caryl Comes back in October 2015) Thanks

## 2014-11-11 NOTE — Telephone Encounter (Signed)
Ok to refill 

## 2014-11-25 ENCOUNTER — Ambulatory Visit (INDEPENDENT_AMBULATORY_CARE_PROVIDER_SITE_OTHER): Payer: Medicare Other | Admitting: Psychology

## 2014-11-25 DIAGNOSIS — F332 Major depressive disorder, recurrent severe without psychotic features: Secondary | ICD-10-CM | POA: Diagnosis not present

## 2014-11-25 DIAGNOSIS — M25561 Pain in right knee: Secondary | ICD-10-CM | POA: Diagnosis not present

## 2014-11-25 DIAGNOSIS — M25562 Pain in left knee: Secondary | ICD-10-CM | POA: Diagnosis not present

## 2014-11-29 ENCOUNTER — Encounter: Payer: Self-pay | Admitting: Cardiology

## 2014-12-09 ENCOUNTER — Ambulatory Visit (INDEPENDENT_AMBULATORY_CARE_PROVIDER_SITE_OTHER): Payer: Medicare Other | Admitting: Psychology

## 2014-12-09 DIAGNOSIS — F332 Major depressive disorder, recurrent severe without psychotic features: Secondary | ICD-10-CM | POA: Diagnosis not present

## 2014-12-19 DIAGNOSIS — L03032 Cellulitis of left toe: Secondary | ICD-10-CM | POA: Diagnosis not present

## 2014-12-19 DIAGNOSIS — M2042 Other hammer toe(s) (acquired), left foot: Secondary | ICD-10-CM | POA: Diagnosis not present

## 2014-12-19 DIAGNOSIS — M71572 Other bursitis, not elsewhere classified, left ankle and foot: Secondary | ICD-10-CM | POA: Diagnosis not present

## 2014-12-23 ENCOUNTER — Ambulatory Visit (INDEPENDENT_AMBULATORY_CARE_PROVIDER_SITE_OTHER): Payer: Medicare Other | Admitting: Psychology

## 2014-12-23 DIAGNOSIS — F332 Major depressive disorder, recurrent severe without psychotic features: Secondary | ICD-10-CM

## 2014-12-23 DIAGNOSIS — K219 Gastro-esophageal reflux disease without esophagitis: Secondary | ICD-10-CM | POA: Diagnosis not present

## 2015-01-06 ENCOUNTER — Ambulatory Visit: Payer: Medicare Other | Admitting: Psychology

## 2015-01-15 ENCOUNTER — Ambulatory Visit (INDEPENDENT_AMBULATORY_CARE_PROVIDER_SITE_OTHER): Payer: Medicare Other | Admitting: Psychiatry

## 2015-01-15 ENCOUNTER — Encounter (HOSPITAL_COMMUNITY): Payer: Self-pay | Admitting: Psychiatry

## 2015-01-15 DIAGNOSIS — F329 Major depressive disorder, single episode, unspecified: Secondary | ICD-10-CM | POA: Diagnosis not present

## 2015-01-15 DIAGNOSIS — F330 Major depressive disorder, recurrent, mild: Secondary | ICD-10-CM

## 2015-01-15 MED ORDER — LORAZEPAM 0.5 MG PO TABS
ORAL_TABLET | ORAL | Status: DC
Start: 2015-01-15 — End: 2015-04-09

## 2015-01-15 MED ORDER — ESCITALOPRAM OXALATE 10 MG PO TABS: 10.0000 mg | ORAL_TABLET | Freq: Every day | ORAL | Status: DC

## 2015-01-15 NOTE — Progress Notes (Signed)
[facility] [phone] Progress Note  [name] [id number] 81 y.o.  01/15/2015 3:50 PM  Chief Complaint:  Medication management and followup.       History presenting illness [name] came for her followup appointment.  She is complaining of foot pain and recently she has infection in her toes but is not getting better.  She has difficulty walking.  She is taking her Lexapro every day and some nights lorazepam to help her sleep.  She is not happy with her living situation.  She does not like her neighborhood and now she is thinking to move out but she is distressed about selling her condo and then finding a new place.  Her son is somewhat helping her.  Patient denies any irritability or any agitation.  She feel her current medicine working most of the time.  She has chronic health issues but she is handling better.  She is seeing her therapist every 2 weeks and recently seen Dr. Lovena Le for her health needs and there has been no new changes.  Patient denies any feeling of hopelessness or worthlessness.  She denies any paranoia or any hallucination.  Her appetite is okay.  She sleeping good.  She lives by herself.  Her vitals are stable.  Suicidal Ideation: No Plan Formed: No Patient has means to carry out plan: No  Homicidal Ideation: No Plan Formed: No Patient has means to carry out plan: No  Review of Systems: Psychiatric: Agitation: No Hallucination: No Depressed Mood: No Insomnia: No Hypersomnia: No Altered Concentration: No Feels Worthless: No Grandiose Ideas: No Belief In Special Powers: No New/Increased Substance Abuse: No Compulsions: No  Neurologic: Headache: No Seizure: No Paresthesias: No ROS   Medical history Patient has history of hypothyroidism, vitamin D deficiency, hyperlipidemia, hypertension, allergic rhinitis, GERD, and a fissure, cystitis, chronic diarrhea, afibrillation, osteoporosis, chronic fatigue, history of breast cancer, hearing loss,  irritable bladder, chronic knee pain and dysphagia.  Her primary care physician is Dr. Richardson Landry.  Outpatient Encounter Prescriptions as of 01/15/2015  Medication Sig  . allopurinol (ZYLOPRIM) 300 MG tablet Take 1 tablet (300 mg total) by mouth daily.  . Calcium Carbonate-Vit D-Min (CALTRATE 600+D PLUS PO) Take 1 tablet by mouth daily.  . Cetirizine HCl (ZYRTEC ALLERGY) 10 MG CAPS Take 1 capsule by mouth daily as needed.   . Cholecalciferol (VITAMIN D3) 2000 UNITS TABS Take 1 tablet by mouth as needed.   [name] EPIPEN 2-PAK 0.3 MG/0.3ML DEVI Inject 1 Device into the muscle daily as needed. For allergic reaction  . escitalopram (LEXAPRO) 10 MG tablet Take 1 tablet (10 mg total) by mouth daily.  . Garlic 852 MG TABS Take 1 tablet by mouth daily.  [name] Kelp 150 MCG TABS Take 150 mg by mouth daily.  [name] levothyroxine (SYNTHROID, LEVOTHROID) 100 MCG tablet TAKE ONE TABLET BY MOUTH ONCE DAILY  . LORazepam (ATIVAN) 0.5 MG tablet TAKE ONE TABLET BY MOUTH AS NEEDED FOR ANXIETY  . magnesium oxide (MAG-OX) 400 MG tablet Take 400 mg by mouth daily.  [name] olopatadine (PATANOL) 0.1 % ophthalmic solution 1 drop 2 (two) times daily.  [name] oxybutynin (DITROPAN) 5 MG tablet TAKE ONE TABLET BY MOUTH THREE TIMES DAILY  . pindolol (VISKEN) 5 MG tablet Take 1 tablet (5 mg total) by mouth daily.  . Psyllium (METAMUCIL PO) Once daily  . RABEprazole (ACIPHEX) 20 MG tablet Take 1 tablet by mouth 2 (two) times daily before a meal.  . spironolactone (ALDACTONE) 25 MG tablet TAKE ONE TABLET  BY MOUTH ONCE DAILY  . triamcinolone ointment (KENALOG) 0.5 % Apply 1 application topically 2 (two) times daily as needed. Apply to affected area twice a day as needed  . [DISCONTINUED] escitalopram (LEXAPRO) 10 MG tablet Take 1 tablet (10 mg total) by mouth daily.  . [DISCONTINUED] LORazepam (ATIVAN) 0.5 MG tablet TAKE ONE TABLET BY MOUTH AS NEEDED FOR ANXIETY   No facility-administered encounter medications on file as of 01/15/2015.    Past  Psychiatric History/Hospitalization(s): Anxiety: Yes Bipolar Disorder: No Depression: Yes Mania: No Psychosis: No Schizophrenia: No Personality Disorder: No Hospitalization for psychiatric illness: Yes History of Electroconvulsive Shock Therapy: No Prior Suicide Attempts: No  Physical Exam: Constitutional:  There were no vitals taken for this visit.  Mental status examination Patient is casually dressed and fairly groomed.   She is pleasant and cooperative.  She maintained fair eye contact.  She described her mood euthymic and her affect is appropriate.  Her speech is soft clear and coherent.  She denies any active or passive suicidal thoughts or homicidal thoughts.  There were no paranoia or delusions present at this time.   Her psychomotor activity is slow.  Her fund of knowledge is good.  Her attention concentration is okay.  She is alert and oriented x3.  Her insight judgment and impulse control is okay.  Established Problem, Stable/Improving (1), Review and summation of old records (2), Review of Last Therapy Session (1) and Review of Medication Regimen & Side Effects (2)  Assessment: Axis I: Maj. depressive disorder  Axis II: Deferred  Axis III: See medical history  Plan:  Patient is fairly stable on Lexapro 10 mg daily and Ativan 0.5 mg as needed.  Discussed medication side effects and benefits.  She has no concerns or any side effects.  Recommended to keep appointment with Dr. Cheryln Manly for counseling.  I will see her in 3 months however recommended to call us back if she has any question, concern if she feel worsening of the symptom.  ARFEEN,SYED T., MD 01/15/2015

## 2015-01-20 ENCOUNTER — Encounter: Payer: Self-pay | Admitting: Internal Medicine

## 2015-01-20 ENCOUNTER — Telehealth: Payer: Self-pay | Admitting: Cardiology

## 2015-01-20 ENCOUNTER — Ambulatory Visit (INDEPENDENT_AMBULATORY_CARE_PROVIDER_SITE_OTHER): Payer: Medicare Other | Admitting: *Deleted

## 2015-01-20 ENCOUNTER — Ambulatory Visit (INDEPENDENT_AMBULATORY_CARE_PROVIDER_SITE_OTHER): Payer: Medicare Other | Admitting: Psychology

## 2015-01-20 DIAGNOSIS — F332 Major depressive disorder, recurrent severe without psychotic features: Secondary | ICD-10-CM

## 2015-01-20 DIAGNOSIS — I495 Sick sinus syndrome: Secondary | ICD-10-CM | POA: Diagnosis not present

## 2015-01-20 NOTE — Telephone Encounter (Signed)
Spoke with pt and reminded pt of remote transmission that is due today. Pt verbalized understanding.   

## 2015-01-21 LAB — CUP PACEART REMOTE DEVICE CHECK
Battery Impedance: 1789 Ohm
Battery Voltage: 2.75 V
Brady Statistic AP VP Percent: 1 %
Brady Statistic AP VS Percent: 85 %
Date Time Interrogation Session: 20160926172532
Lead Channel Impedance Value: 518 Ohm
Lead Channel Impedance Value: 548 Ohm
Lead Channel Pacing Threshold Amplitude: 0.625 V
Lead Channel Pacing Threshold Pulse Width: 0.4 ms
Lead Channel Pacing Threshold Pulse Width: 0.4 ms
Lead Channel Setting Pacing Amplitude: 2 V
Lead Channel Setting Pacing Amplitude: 2.5 V
Lead Channel Setting Sensing Sensitivity: 2 mV
MDC IDC MSMT BATTERY REMAINING LONGEVITY: 36 mo
MDC IDC MSMT LEADCHNL RV PACING THRESHOLD AMPLITUDE: 0.5 V
MDC IDC MSMT LEADCHNL RV SENSING INTR AMPL: 5.6 mV — AB
MDC IDC SET LEADCHNL RV PACING PULSEWIDTH: 0.4 ms
MDC IDC STAT BRADY AS VP PERCENT: 0 %
MDC IDC STAT BRADY AS VS PERCENT: 13 %

## 2015-01-21 NOTE — Progress Notes (Signed)
Remote pacemaker transmission.   

## 2015-02-03 ENCOUNTER — Ambulatory Visit (INDEPENDENT_AMBULATORY_CARE_PROVIDER_SITE_OTHER): Payer: Medicare Other | Admitting: Psychology

## 2015-02-03 DIAGNOSIS — F332 Major depressive disorder, recurrent severe without psychotic features: Secondary | ICD-10-CM | POA: Diagnosis not present

## 2015-02-04 ENCOUNTER — Encounter: Payer: Self-pay | Admitting: Cardiology

## 2015-02-13 ENCOUNTER — Other Ambulatory Visit: Payer: Self-pay | Admitting: Allergy and Immunology

## 2015-02-13 ENCOUNTER — Ambulatory Visit
Admission: RE | Admit: 2015-02-13 | Discharge: 2015-02-13 | Disposition: A | Discharge status: Home / Self Care | Payer: Medicare Other | Source: Ambulatory Visit | Attending: Allergy and Immunology | Admitting: Allergy and Immunology

## 2015-02-13 DIAGNOSIS — R042 Hemoptysis: Secondary | ICD-10-CM

## 2015-02-13 DIAGNOSIS — J301 Allergic rhinitis due to pollen: Secondary | ICD-10-CM | POA: Diagnosis not present

## 2015-02-13 DIAGNOSIS — R0602 Shortness of breath: Secondary | ICD-10-CM | POA: Diagnosis not present

## 2015-02-13 DIAGNOSIS — J302 Other seasonal allergic rhinitis: Secondary | ICD-10-CM | POA: Diagnosis not present

## 2015-02-17 ENCOUNTER — Ambulatory Visit (INDEPENDENT_AMBULATORY_CARE_PROVIDER_SITE_OTHER): Payer: Medicare Other | Admitting: Psychology

## 2015-02-17 DIAGNOSIS — F332 Major depressive disorder, recurrent severe without psychotic features: Secondary | ICD-10-CM | POA: Diagnosis not present

## 2015-02-25 DIAGNOSIS — H0289 Other specified disorders of eyelid: Secondary | ICD-10-CM | POA: Diagnosis not present

## 2015-02-25 DIAGNOSIS — H401290 Low-tension glaucoma, unspecified eye, stage unspecified: Secondary | ICD-10-CM | POA: Diagnosis not present

## 2015-02-25 DIAGNOSIS — H43813 Vitreous degeneration, bilateral: Secondary | ICD-10-CM | POA: Diagnosis not present

## 2015-02-26 DIAGNOSIS — M25512 Pain in left shoulder: Secondary | ICD-10-CM | POA: Diagnosis not present

## 2015-02-26 DIAGNOSIS — M25561 Pain in right knee: Secondary | ICD-10-CM | POA: Diagnosis not present

## 2015-02-26 DIAGNOSIS — M25562 Pain in left knee: Secondary | ICD-10-CM | POA: Diagnosis not present

## 2015-02-27 DIAGNOSIS — M25512 Pain in left shoulder: Secondary | ICD-10-CM | POA: Diagnosis not present

## 2015-02-27 DIAGNOSIS — M25561 Pain in right knee: Secondary | ICD-10-CM | POA: Diagnosis not present

## 2015-02-27 DIAGNOSIS — M25562 Pain in left knee: Secondary | ICD-10-CM | POA: Diagnosis not present

## 2015-03-06 ENCOUNTER — Encounter: Payer: Self-pay | Admitting: Internal Medicine

## 2015-03-06 ENCOUNTER — Ambulatory Visit (INDEPENDENT_AMBULATORY_CARE_PROVIDER_SITE_OTHER): Payer: Medicare Other | Admitting: Internal Medicine

## 2015-03-06 VITALS — BP 136/90 | HR 69 | Wt 175.0 lb

## 2015-03-06 DIAGNOSIS — M67911 Unspecified disorder of synovium and tendon, right shoulder: Secondary | ICD-10-CM

## 2015-03-06 DIAGNOSIS — M67919 Unspecified disorder of synovium and tendon, unspecified shoulder: Secondary | ICD-10-CM | POA: Insufficient documentation

## 2015-03-06 MED ORDER — MELOXICAM 7.5 MG PO TABS: 7.5000 mg | ORAL_TABLET | Freq: Every day | ORAL | Status: DC | PRN

## 2015-03-06 NOTE — Progress Notes (Signed)
Subjective:  Patient ID: [name], female    DOB: 1930/01/01  Age: 81 y.o. MRN: [id number]  CC: No chief complaint on file.   HPI [name] presents for R shoulder pain x 1-2 weeks. No injury. Pt had 2 knees injected by Dr Rhona Raider 2 weeks ago  Outpatient Prescriptions Prior to Visit  Medication Sig Dispense Refill  . allopurinol (ZYLOPRIM) 300 MG tablet Take 1 tablet (300 mg total) by mouth daily. 30 tablet 4  . Calcium Carbonate-Vit D-Min (CALTRATE 600+D PLUS PO) Take 1 tablet by mouth daily.    . Cetirizine HCl (ZYRTEC ALLERGY) 10 MG CAPS Take 1 capsule by mouth daily as needed.     . Cholecalciferol (VITAMIN D3) 2000 UNITS TABS Take 1 tablet by mouth as needed.     [name] escitalopram (LEXAPRO) 10 MG tablet Take 1 tablet (10 mg total) by mouth daily. 90 tablet 0  . Garlic 123XX123 MG TABS Take 1 tablet by mouth daily.    [name] Kelp 150 MCG TABS Take 150 mg by mouth daily.    [name] levothyroxine (SYNTHROID, LEVOTHROID) 100 MCG tablet TAKE ONE TABLET BY MOUTH ONCE DAILY 90 tablet 3  . LORazepam (ATIVAN) 0.5 MG tablet TAKE ONE TABLET BY MOUTH AS NEEDED FOR ANXIETY 30 tablet 2  . magnesium oxide (MAG-OX) 400 MG tablet Take 400 mg by mouth daily.    [name] oxybutynin (DITROPAN) 5 MG tablet TAKE ONE TABLET BY MOUTH THREE TIMES DAILY 90 tablet 5  . pindolol (VISKEN) 5 MG tablet Take 1 tablet (5 mg total) by mouth daily. 30 tablet 11  . Psyllium (METAMUCIL PO) Once daily    . spironolactone (ALDACTONE) 25 MG tablet TAKE ONE TABLET BY MOUTH ONCE DAILY 30 tablet 11  . triamcinolone ointment (KENALOG) 0.5 % Apply 1 application topically 2 (two) times daily as needed. Apply to affected area twice a day as needed 30 g 2  . EPIPEN 2-PAK 0.3 MG/0.3ML DEVI Inject 1 Device into the muscle daily as needed. For allergic reaction    . olopatadine (PATANOL) 0.1 % ophthalmic solution 1 drop 2 (two) times daily.    . RABEprazole (ACIPHEX) 20 MG tablet Take 1 tablet by mouth 2 (two) times daily before a meal.     No  facility-administered medications prior to visit.    ROS Review of Systems  Constitutional: Negative for chills, activity change, appetite change, fatigue and unexpected weight change.  HENT: Negative for congestion, mouth sores and sinus pressure.   Eyes: Negative for visual disturbance.  Respiratory: Negative for cough and chest tightness.   Cardiovascular: Negative for leg swelling.  Gastrointestinal: Negative for nausea and abdominal pain.  Genitourinary: Negative for frequency, difficulty urinating and vaginal pain.  Musculoskeletal: Negative for back pain and gait problem.  Skin: Negative for pallor and rash.  Neurological: Negative for dizziness, tremors, weakness, numbness and headaches.  Psychiatric/Behavioral: Negative for confusion and sleep disturbance.    Objective:  BP 136/90 mmHg  Pulse 69  Wt 175 lb (79.379 kg)  SpO2 98%  BP Readings from Last 3 Encounters:  03/06/15 136/90  11/04/14 124/74  10/22/14 138/86    Wt Readings from Last 3 Encounters:  03/06/15 175 lb (79.379 kg)  11/04/14 179 lb (81.194 kg)  10/22/14 183 lb (83.008 kg)    Physical Exam  Constitutional: She appears well-developed. No distress.  HENT:  Head: Normocephalic.  Right Ear: External ear normal.  Left Ear: External ear normal.  Nose: Nose normal.  Mouth/Throat: Oropharynx  is clear and moist.  Eyes: Conjunctivae are normal. Pupils are equal, round, and reactive to light. Right eye exhibits no discharge. Left eye exhibits no discharge.  Neck: Normal range of motion. Neck supple. No JVD present. No tracheal deviation present. No thyromegaly present.  Cardiovascular: Normal rate, regular rhythm and normal heart sounds.   Pulmonary/Chest: No stridor. No respiratory distress. She has no wheezes.  Abdominal: Soft. Bowel sounds are normal. She exhibits no distension and no mass. There is no tenderness. There is no rebound and no guarding.  Musculoskeletal: She exhibits no edema or  tenderness.  Lymphadenopathy:    She has no cervical adenopathy.  Neurological: She displays normal reflexes. No cranial nerve deficit. She exhibits normal muscle tone. Coordination normal.  Skin: No rash noted. No erythema.  Psychiatric: She has a normal mood and affect. Her behavior is normal. Judgment and thought content normal.  R shoulder - tender w/ROM  Lab Results  Component Value Date   WBC 4.0 10/22/2014   HGB 13.5 10/22/2014   HCT 40.6 10/22/2014   PLT 212.0 10/22/2014   GLUCOSE 95 07/30/2014   CHOL 174 05/11/2010   TRIG 78.0 05/11/2010   HDL 52.30 05/11/2010   LDLDIRECT 130.8 09/30/2006   LDLCALC 106* 05/11/2010   ALT 12 01/01/2013   AST 19 01/01/2013   NA 134* 07/30/2014   K 4.3 07/30/2014   CL 100 07/30/2014   CREATININE 0.97 07/30/2014   BUN 18 07/30/2014   CO2 29 07/30/2014   TSH 0.38 07/30/2014   INR 1.0 10/22/2014   HGBA1C 6.0 04/10/2012    Dg Chest 2 View  02/13/2015  CLINICAL DATA:  Hemoptysis.  Symptoms for a few weeks. EXAM: CHEST  2 VIEW COMPARISON:  01/22/2014. FINDINGS: Dual lead pacer from RIGHT subclavian approach appropriately positioned. Cardiomegaly. Calcified tortuous aorta. LEFT mastectomy with axillary node dissection. No pulmonary nodules, infiltrates, or failure. No effusion or pneumothorax. No osseous findings. IMPRESSION: Stable chest.  No active disease. Electronically Signed   By: Staci Righter M.D.   On: 02/13/2015 17:06    Assessment & Plan:   There are no diagnoses linked to this encounter. I am having [name] maintain her Cetirizine HCl, EPIPEN 2-PAK, Psyllium (METAMUCIL PO), RABEprazole, magnesium oxide, Calcium Carbonate-Vit D-Min (CALTRATE 600+D PLUS PO), Vitamin D3, Garlic, levothyroxine, triamcinolone ointment, pindolol, olopatadine, Kelp, oxybutynin, spironolactone, allopurinol, LORazepam, and escitalopram.  No orders of the defined types were placed in this encounter.     Follow-up: No Follow-up on file.  Walker Kehr, MD

## 2015-03-06 NOTE — Patient Instructions (Signed)
Shoulder Range of Motion Exercises Shoulder range of motion (ROM) exercises are designed to keep the shoulder moving freely. They are often recommended for people who have shoulder pain. MOVEMENT EXERCISE When you are able, do this exercise 5-6 days per week, or as told by your health care provider. Work toward doing 2 sets of 10 swings. Pendulum Exercise How To Do This Exercise Lying Down 1. Lie face-down on a bed with your abdomen close to the side of the bed. 2. Let your arm hang over the side of the bed. 3. Relax your shoulder, arm, and hand. 4. Slowly and gently swing your arm forward and back. Do not use your neck muscles to swing your arm. They should be relaxed. If you are struggling to swing your arm, have someone gently swing it for you. When you do this exercise for the first time, swing your arm at a 15 degree angle for 15 seconds, or swing your arm 10 times. As pain lessens over time, increase the angle of the swing to 30-45 degrees. 5. Repeat steps 1-4 with the other arm. How To Do This Exercise While Standing 1. Stand next to a sturdy chair or table and hold on to it with your hand.  Bend forward at the waist.  Bend your knees slightly.  Relax your other arm and let it hang limp.  Relax the shoulder blade of the arm that is hanging and let it drop.  While keeping your shoulder relaxed, use body motion to swing your arm in small circles. The first time you do this exercise, swing your arm for about 30 seconds or 10 times. When you do it next time, swing your arm for a little longer.  Stand up tall and relax.  Repeat steps 1-7, this time changing the direction of the circles. 2. Repeat steps 1-8 with the other arm. STRETCHING EXERCISES Do these exercises 3-4 times per day on 5-6 days per week or as told by your health care provider. Work toward holding the stretch for 20 seconds. Stretching Exercise 1 1. Lift your arm straight out in front of you. 2. Bend your arm 90  degrees at the elbow (right angle) so your forearm goes across your body and looks like the letter "L." 3. Use your other arm to gently pull the elbow forward and across your body. 4. Repeat steps 1-3 with the other arm. Stretching Exercise 2 You will need a towel or rope for this exercise. 1. Bend one arm behind your back with the palm facing outward. 2. Hold a towel with your other hand. 3. Reach the arm that holds the towel above your head, and bend that arm at the elbow. Your wrist should be behind your neck. 4. Use your free hand to grab the free end of the towel. 5. With the higher hand, gently pull the towel up behind you. 6. With the lower hand, pull the towel down behind you. 7. Repeat steps 1-6 with the other arm. STRENGTHENING EXERCISES Do each of these exercises at four different times of day (sessions) every day or as told by your health care provider. To begin with, repeat each exercise 5 times (repetitions). Work toward doing 3 sets of 12 repetitions or as told by your health care provider. Strengthening Exercise 1 You will need a light weight for this activity. As you grow stronger, you may use a heavier weight. 1. Standing with a weight in your hand, lift your arm straight out to the side   until it is at the same height as your shoulder. 2. Bend your arm at 90 degrees so that your fingers are pointing to the ceiling. 3. Slowly raise your hand until your arm is straight up in the air. 4. Repeat steps 1-3 with the other arm. Strengthening Exercise 2 You will need a light weight for this activity. As you grow stronger, you may use a heavier weight. 1. Standing with a weight in your hand, gradually move your straight arm in an arc, starting at your side, then out in front of you, then straight up over your head. 2. Gradually move your other arm in an arc, starting at your side, then out in front of you, then straight up over your head. 3. Repeat steps 1-2 with the other  arm. Strengthening Exercise 3 You will need an elastic band for this activity. As you grow stronger, gradually increase the size of the bands or increase the number of bands that you use at one time. 1. While standing, hold an elastic band in one hand and raise that arm up in the air. 2. With your other hand, pull down the band until that hand is by your side. 3. Repeat steps 1-2 with the other arm.   This information is not intended to replace advice given to you by your health care provider. Make sure you discuss any questions you have with your health care provider.   Document Released: 01/09/2003 Document Revised: 08/27/2014 Document Reviewed: 04/08/2014 Elsevier Interactive Patient Education 2016 Branch Cuff Tendinitis Rotator cuff tendinitis is inflammation of the tough, cord-like bands that connect muscle to bone (tendons) in your rotator cuff. Your rotator cuff is the collection of all the muscles and tendons that connect your arm to your shoulder. Your rotator cuff holds the head of your upper arm bone (humerus) in the cup (fossa) of your shoulder blade (scapula). CAUSES Rotator cuff tendinitis is usually caused by overusing the joint involved.  SIGNS AND SYMPTOMS 6. Deep ache in the shoulder also felt on the outside upper arm over the shoulder muscle. 7. Point tenderness over the area that is injured. 8. Pain comes on gradually and becomes worse with lifting the arm to the side (abduction) or turning it inward (internal rotation). 9. May lead to a chronic tear: When a rotator cuff tendon becomes inflamed, it runs the risk of losing its blood supply, causing some tendon fibers to die. This increases the risk that the tendon can fray and partially or completely tear. DIAGNOSIS Rotator cuff tendinitis is diagnosed by taking a medical history, performing a physical exam, and reviewing results of imaging exams. The medical history is useful to help determine the type of  rotator cuff injury. The physical exam will include looking at the injured shoulder, feeling the injured area, and watching you do range-of-motion exercises. X-ray exams are typically done to rule out other causes of shoulder pain, such as fractures. MRI is the imaging exam usually used for significant shoulder injuries. Sometimes a dye study called CT arthrogram is done, but it is not as widely used as MRI. In some institutions, special ultrasound tests may also be used to aid in the diagnosis. TREATMENT  Less Severe Cases 3. Use of a sling to rest the shoulder for a short period of time. Prolonged use of the sling can cause stiffness, weakness, and loss of motion of the shoulder joint. 4. Anti-inflammatory medicines, such as ibuprofen or naproxen sodium, may be prescribed. More Severe Cases  5. Physical therapy. 6. Use of steroid injections into the shoulder joint. 7. Surgery. HOME CARE INSTRUCTIONS  8. Use a sling or splint until the pain decreases. Prolonged use of the sling can cause stiffness, weakness, and loss of motion of the shoulder joint. 9. Apply ice to the injured area: 1. Put ice in a plastic bag. 2. Place a towel between your skin and the bag. 3. Leave the ice on for 20 minutes, 2-3 times a day. 10. Try to avoid use other than gentle range of motion while your shoulder is painful. Use the shoulder and exercise only as directed by your health care provider. Stop exercises or range of motion if pain or discomfort increases, unless directed otherwise by your health care provider. 11. Only take over-the-counter or prescription medicines for pain, discomfort, or fever as directed by your health care provider. 12. If you were given a shoulder sling and straps (immobilizer), do not remove it except as directed, or until you see a health care provider for a follow-up exam. If you need to remove it, move your arm as little as possible or as directed. 13. You may want to sleep on several  pillows at night to lessen swelling and pain. SEEK IMMEDIATE MEDICAL CARE IF:  5. Your shoulder pain increases or new pain develops in your arm, hand, or fingers and is not relieved with medicines. 6. You have new, unexplained symptoms, especially increased numbness in the hands or loss of strength. 7. You develop any worsening of the problems that brought you in for care. 8. Your arm, hand, or fingers are numb or tingling. 9. Your arm, hand, or fingers are swollen, painful, or turn white or blue. MAKE SURE YOU: 4. Understand these instructions. 5. Will watch your condition. 6. Will get help right away if you are not doing well or get worse.   This information is not intended to replace advice given to you by your health care provider. Make sure you discuss any questions you have with your health care provider.   Document Released: 07/03/2003 Document Revised: 05/03/2014 Document Reviewed: 11/22/2012 Elsevier Interactive Patient Education [employer].

## 2015-03-06 NOTE — Progress Notes (Signed)
Pre visit review using our clinic review tool, if applicable. No additional management support is needed unless otherwise documented below in the visit note. 

## 2015-03-06 NOTE — Assessment & Plan Note (Signed)
F/u w/Dr Rhona Raider for w/up and inj ROM exercise

## 2015-03-11 ENCOUNTER — Telehealth: Payer: Self-pay | Admitting: Internal Medicine

## 2015-03-11 DIAGNOSIS — M8589 Other specified disorders of bone density and structure, multiple sites: Secondary | ICD-10-CM | POA: Diagnosis not present

## 2015-03-11 LAB — HM DEXA SCAN: HM DEXA SCAN: -1.6

## 2015-03-11 NOTE — Telephone Encounter (Signed)
Requesting bone density order to be sent over for patient.

## 2015-03-14 ENCOUNTER — Encounter: Payer: Self-pay | Admitting: Internal Medicine

## 2015-03-17 ENCOUNTER — Ambulatory Visit (INDEPENDENT_AMBULATORY_CARE_PROVIDER_SITE_OTHER): Payer: Medicare Other | Admitting: Psychology

## 2015-03-17 DIAGNOSIS — F332 Major depressive disorder, recurrent severe without psychotic features: Secondary | ICD-10-CM

## 2015-03-31 ENCOUNTER — Ambulatory Visit: Payer: Medicare Other | Admitting: Psychology

## 2015-04-09 ENCOUNTER — Encounter (HOSPITAL_COMMUNITY): Payer: Self-pay | Admitting: Psychiatry

## 2015-04-09 ENCOUNTER — Ambulatory Visit (INDEPENDENT_AMBULATORY_CARE_PROVIDER_SITE_OTHER): Payer: Medicare Other | Admitting: Psychiatry

## 2015-04-09 ENCOUNTER — Ambulatory Visit (INDEPENDENT_AMBULATORY_CARE_PROVIDER_SITE_OTHER): Payer: Medicare Other | Admitting: Psychology

## 2015-04-09 VITALS — BP 127/78 | HR 83 | Resp 12 | Ht 63.0 in | Wt 171.8 lb

## 2015-04-09 DIAGNOSIS — F332 Major depressive disorder, recurrent severe without psychotic features: Secondary | ICD-10-CM | POA: Diagnosis not present

## 2015-04-09 DIAGNOSIS — F330 Major depressive disorder, recurrent, mild: Secondary | ICD-10-CM | POA: Diagnosis not present

## 2015-04-09 MED ORDER — ESCITALOPRAM OXALATE 10 MG PO TABS: 10.0000 mg | ORAL_TABLET | Freq: Every day | ORAL | Status: DC

## 2015-04-09 MED ORDER — LORAZEPAM 0.5 MG PO TABS: ORAL_TABLET | ORAL | Status: DC

## 2015-04-09 NOTE — Progress Notes (Signed)
[facility] [phone] Progress Note  [name] [id number] 81 y.o.  04/09/2015 11:21 AM  Chief Complaint:  Medication management and followup.       History presenting illness [name] came for her followup appointment.  She is taking Lexapro and Ativan as needed.  She admitted Thanksgiving was quite and she spent time with the sister.  Lately she is complaining of more stress from the family member.  Her son is staying with her for past 4 months because he was affected.  She is taking care of her son and grandchild.  She admitted some time she get frustrated and overwhelmed.  However she denies any crying spells, feeling of hopelessness or worthlessness.  She feels current medicine is working very well.  She is seeing Dr. Cheryln Manly for counseling.  Her sleep is good.  She denies any major panic attack.  Her energy level is okay.  She has chronic health issues but she is handling better.  She does not like to take too many medication and she had decided to stop meloxicam which was given for shoulder pain.  She believe her shoulder is much better.  Patient denies any suicidal thoughts.  She denies any paranoia or any hallucination.  She like to continue Lexapro and [employer] as needed.  She denies drinking or using any illegal substances.  She does not ask for early refills of her benzodiazepine. Her appetite is okay.  Her vitals are stable.  Suicidal Ideation: No Plan Formed: No Patient has means to carry out plan: No  Homicidal Ideation: No Plan Formed: No Patient has means to carry out plan: No  Review of Systems: Psychiatric: Agitation: No Hallucination: No Depressed Mood: No Insomnia: No Hypersomnia: No Altered Concentration: No Feels Worthless: No Grandiose Ideas: No Belief In Special Powers: No New/Increased Substance Abuse: No Compulsions: No  Neurologic: Headache: No Seizure: No Paresthesias: No ROS   Medical history Patient has history of hypothyroidism,  vitamin D deficiency, hyperlipidemia, hypertension, allergic rhinitis, GERD, and a fissure, cystitis, chronic diarrhea, afibrillation, osteoporosis, chronic fatigue, history of breast cancer, hearing loss, irritable bladder, chronic knee pain and dysphagia.  Her primary care physician is Dr. Richardson Landry.  Outpatient Encounter Prescriptions as of 04/09/2015  Medication Sig  . allopurinol (ZYLOPRIM) 300 MG tablet Take 1 tablet (300 mg total) by mouth daily.  . Calcium Carbonate-Vit D-Min (CALTRATE 600+D PLUS PO) Take 1 tablet by mouth daily.  . Cetirizine HCl (ZYRTEC ALLERGY) 10 MG CAPS Take 1 capsule by mouth daily as needed.   . Cholecalciferol (VITAMIN D3) 2000 UNITS TABS Take 1 tablet by mouth as needed.   [name] EPIPEN 2-PAK 0.3 MG/0.3ML DEVI Inject 1 Device into the muscle daily as needed. For allergic reaction  . escitalopram (LEXAPRO) 10 MG tablet Take 1 tablet (10 mg total) by mouth daily.  [name] Kelp 150 MCG TABS Take 150 mg by mouth daily.  [name] levothyroxine (SYNTHROID, LEVOTHROID) 100 MCG tablet TAKE ONE TABLET BY MOUTH ONCE DAILY  . LORazepam (ATIVAN) 0.5 MG tablet TAKE ONE TABLET BY MOUTH AS NEEDED FOR ANXIETY  . magnesium oxide (MAG-OX) 400 MG tablet Take 400 mg by mouth daily.  [name] olopatadine (PATANOL) 0.1 % ophthalmic solution 1 drop 2 (two) times daily.  [name] oxybutynin (DITROPAN) 5 MG tablet TAKE ONE TABLET BY MOUTH THREE TIMES DAILY  . pindolol (VISKEN) 5 MG tablet Take 1 tablet (5 mg total) by mouth daily.  . Psyllium (METAMUCIL PO) Once daily  . spironolactone (ALDACTONE) 25 MG  tablet TAKE ONE TABLET BY MOUTH ONCE DAILY  . triamcinolone ointment (KENALOG) 0.5 % Apply 1 application topically 2 (two) times daily as needed. Apply to affected area twice a day as needed  . XIIDRA 5 % SOLN INSTILL 1 DROP IN EACH EYE 2 TIMES A DAY  . [DISCONTINUED] escitalopram (LEXAPRO) 10 MG tablet Take 1 tablet (10 mg total) by mouth daily.  . [DISCONTINUED] Garlic 123XX123 MG TABS Take 1 tablet by mouth daily.  .  [DISCONTINUED] LORazepam (ATIVAN) 0.5 MG tablet TAKE ONE TABLET BY MOUTH AS NEEDED FOR ANXIETY  . [DISCONTINUED] meloxicam (MOBIC) 7.5 MG tablet Take 1 tablet (7.5 mg total) by mouth daily as needed for pain (take pc).  . [DISCONTINUED] RABEprazole (ACIPHEX) 20 MG tablet Take 1 tablet by mouth 2 (two) times daily before a meal.   No facility-administered encounter medications on file as of 04/09/2015.    Past Psychiatric History/Hospitalization(s): Anxiety: Yes Bipolar Disorder: No Depression: Yes Mania: No Psychosis: No Schizophrenia: No Personality Disorder: No Hospitalization for psychiatric illness: Yes History of Electroconvulsive Shock Therapy: No Prior Suicide Attempts: No  Physical Exam: Constitutional:  BP 127/78 mmHg  Pulse 83  Resp 12  Ht 5\' 3"  (1.6 m)  Wt 171 lb 12.8 oz (77.928 kg)  BMI 30.44 kg/m2  Mental status examination Patient is casually dressed and fairly groomed.   She is cooperative and maintained good eye contact.  She described her mood euthymic and her affect is appropriate.  Her speech is soft clear and coherent.  She denies any active or passive suicidal thoughts or homicidal thoughts.  There were no paranoia or delusions present at this time.   Her psychomotor activity is slow.  Her fund of knowledge is good.  Her attention concentration is okay.  She is alert and oriented x3.  Her insight judgment and impulse control is okay.  Established Problem, Stable/Improving (1), Review of Psycho-Social Stressors (1), Review of Last Therapy Session (1) and Review of Medication Regimen & Side Effects (2)  Assessment: Axis I: Maj. depressive disorder  Axis II: Deferred  Axis III: See medical history  Plan:  Patient is fairly stable on Lexapro 10 mg daily and Ativan 0.5 mg as needed.  Discussed medication side effects and benefits.  She has no concerns or any side effects.  Recommended to keep appointment with Dr. Cheryln Manly for counseling.  I will see her in 3  months however recommended to call us back if she has any question, concern if she feel worsening of the symptom.  Estaban Mainville T., MD 04/09/2015

## 2015-04-14 ENCOUNTER — Ambulatory Visit (INDEPENDENT_AMBULATORY_CARE_PROVIDER_SITE_OTHER): Payer: Medicare Other | Admitting: Psychology

## 2015-04-14 DIAGNOSIS — F332 Major depressive disorder, recurrent severe without psychotic features: Secondary | ICD-10-CM | POA: Diagnosis not present

## 2015-04-22 ENCOUNTER — Ambulatory Visit: Payer: Medicare Other | Admitting: Internal Medicine

## 2015-04-23 ENCOUNTER — Encounter: Payer: Self-pay | Admitting: Internal Medicine

## 2015-04-23 ENCOUNTER — Ambulatory Visit (INDEPENDENT_AMBULATORY_CARE_PROVIDER_SITE_OTHER): Payer: Medicare Other | Admitting: Internal Medicine

## 2015-04-23 VITALS — BP 122/84 | HR 69 | Ht 62.0 in | Wt 176.0 lb

## 2015-04-23 DIAGNOSIS — I495 Sick sinus syndrome: Secondary | ICD-10-CM

## 2015-04-23 DIAGNOSIS — I480 Paroxysmal atrial fibrillation: Secondary | ICD-10-CM

## 2015-04-23 DIAGNOSIS — Z950 Presence of cardiac pacemaker: Secondary | ICD-10-CM

## 2015-04-23 NOTE — Patient Instructions (Signed)
Medication Instructions: - no changes  Labwork: - none  Procedures/Testing: - none  Follow-Up: - Remote monitoring is used to monitor your Pacemaker of ICD from home. This monitoring reduces the number of office visits required to check your device to one time per year. It allows [place] to keep an eye on the functioning of your device to ensure it is working properly. You are scheduled for a device check from home on 07/23/15. You may send your transmission at any time that day. If you have a wireless device, the transmission will be sent automatically. After your physician reviews your transmission, you will receive a postcard with your next transmission date.  - Your physician wants you to follow-up in: 1 year with Chanetta Marshall, NP for Dr. Caryl Comes. You will receive a reminder letter in the mail two months in advance. If you don't receive a letter, please call our office to schedule the follow-up appointment.  Any Additional Special Instructions Will Be Listed Below (If Applicable).

## 2015-04-23 NOTE — Progress Notes (Signed)
Patient Care Team: Cassandria Anger, MD as PCP - General Kathlee Nations, MD (Psychiatry) Richmond Campbell, MD (Gastroenterology) Fay Records, MD (Cardiology) Johnathan Hausen, MD (General Surgery) Melrose Nakayama, MD as Consulting Physician (Orthopedic Surgery)   HPI  [name] is a 81 y.o. female seen in followup for orthostatic intolerance, exercise intolerance, and prior pacemaker implantation for sinus node dysfunction.   She complains of dyspnea on exertion  This is stable. Stress echo 2012 normal LV function. She's had no edema. She has no dietary indiscretion. She has no orthopnea or nocturnal dyspnea.   She is doing pretty well ;   No significant lightheadedness   her symptoms of this is in August and is moved in with her along with her grandson. This is "driving her crazy". She also has complaints of pain at the bottom of her left foot       Past Medical History      Past Medical History  Diagnosis Date  . Thyroid disease   . Glaucoma   . IBS (irritable bowel syndrome)   . Incontinence   . GERD (gastroesophageal reflux disease)   . Hypertension   . Anxiety   . Depression     Dr Cheryln Manly  . HX: breast cancer   . Osteoarthritis   . Osteopenia   . Bradycardia   . IBS (irritable bowel syndrome)   . Discoid lupus     skin  . Pacemaker     Brady//Chronotropic incompetence with normal pacemaker function  . Full dentures   . Wears hearing aid     both ears  . Hx of echocardiogram     a.  Echocardiogram (03/2011): Mild LVH, EF 0000000, grade 1 diastolic dysfunction, mild MR, mild to moderate TR, PASP 42;  b. Echo (05/2013):  Mod LVH, EF 65-70%, no WMA, Gr 1 DD, mild MR, mod TR, PASP 39  . Hx of exercise stress test     a. ETT-echocardiogram (06/08/11): Sub-optimal exercise. Test stopped early due to dizziness and hypotension. EF 60%.  Non-diagnostic.;   b. Lexiscan Myoview (06/2013):  Diaphragmatic attenuation, no ischemia, EF 61%.  Low Risk  . Pacemaker      Past Surgical History  Procedure Laterality Date  . Pacemaker placement  2009  . Foot surgery  2004    Left-hammer toes  . Mastectomy  2002    bilat mastectomy-snbx  . Breast lumpectomy  1995    lt -axillary node dissectoin  . Dorsal compartment release  2000    left  . Abdominal hysterectomy    . Eye surgery  2011    cataract-lt  . Knee arthroscopy Left 08/29/2012    Procedure: LEFT KNEE ARTHROSCOPY ;  Surgeon: Hessie Dibble, MD;  Location: [facility];  Service: Orthopedics;  Laterality: Left;  . Trigger finger release Right 07/31/2013    Procedure: EXCISE MASS RIGHT INDEX A-1 PLLLEY RELEASE A-1 RIGHT INDEX;  Surgeon: Cammie Sickle., MD;  Location: [facility];  Service: Orthopedics;  Laterality: Right;    Current Outpatient Prescriptions  Medication Sig Dispense Refill  . allopurinol (ZYLOPRIM) 300 MG tablet Take 1 tablet (300 mg total) by mouth daily. 30 tablet 4  . Cetirizine HCl (ZYRTEC ALLERGY) 10 MG CAPS Take 1 capsule by mouth daily as needed.     . Cholecalciferol (VITAMIN D3) 2000 UNITS TABS Take 1 tablet by mouth as needed.     [name] EPIPEN 2-PAK 0.3  MG/0.3ML DEVI Inject 1 Device into the muscle daily as needed. For allergic reaction    . escitalopram (LEXAPRO) 10 MG tablet Take 1 tablet (10 mg total) by mouth daily. 90 tablet 0  . Kelp 150 MCG TABS Take 150 mg by mouth daily.    [name] levothyroxine (SYNTHROID, LEVOTHROID) 100 MCG tablet TAKE ONE TABLET BY MOUTH ONCE DAILY 90 tablet 3  . LORazepam (ATIVAN) 0.5 MG tablet TAKE ONE TABLET BY MOUTH AS NEEDED FOR ANXIETY 30 tablet 1  . magnesium oxide (MAG-OX) 400 MG tablet Take 400 mg by mouth daily.    [name] oxybutynin (DITROPAN) 5 MG tablet TAKE ONE TABLET BY MOUTH THREE TIMES DAILY 90 tablet 5  . pindolol (VISKEN) 5 MG tablet Take 1 tablet (5 mg total) by mouth daily. 30 tablet 11  . Psyllium (METAMUCIL PO) Once daily    . spironolactone (ALDACTONE) 25 MG tablet TAKE ONE TABLET BY MOUTH ONCE  DAILY 30 tablet 11  . triamcinolone ointment (KENALOG) 0.5 % Apply 1 application topically 2 (two) times daily as needed. Apply to affected area twice a day as needed 30 g 2  . XIIDRA 5 % SOLN INSTILL 1 DROP IN EACH EYE 2 TIMES A DAY  6   No current facility-administered medications for this visit.    Allergies  Allergen Reactions  . Lactase Diarrhea  . Amlodipine Besy-Benazepril Hcl Swelling  . Amlodipine Besylate Swelling  . Clonidine Hydrochloride     REACTION: tired  . Lactose Intolerance (Gi) Other (See Comments)  . Valsartan Other (See Comments)    Tongue swelling   . Verapamil     Review of Systems negative except from HPI and PMH  Physical Exam BP 122/84 mmHg  Pulse 69  Ht 5\' 2"  (1.575 m)  Wt 176 lb (79.833 kg)  BMI 32.18 kg/m2 Well developed and well nourished in no acute distress HENT normal E scleral and icterus clear Neck Supple JVP flat; carotids brisk and full Clear to ausculation  Regular rate and rhythm, no murmurs gallops or rub Soft with active bowel sounds No clubbing cyanosis none Edema Alert and oriented, grossly normal motor and sensory function Skin Warm and Dry  ECG  Sinus with freq PACs  22/08/41  Assessment and  Plan  Sinus node dysfunction   Pacemaker  [employer]tronic  The patient's device was interrogated.  The information was reviewed. No changes were made in the programming.     Hypertension   Stable  Measurements at home in the 120 range   BP  weell contolled   exercise tolerance is stable.   Social issues remain a challenge with her son now at home with her grandson

## 2015-04-24 LAB — CUP PACEART INCLINIC DEVICE CHECK
Battery Remaining Longevity: 34 mo
Battery Voltage: 2.75 V
Brady Statistic AS VP Percent: 0 %
Implantable Lead Implant Date: 20090527
Implantable Lead Location: 753860
Implantable Lead Model: 5076
Implantable Lead Model: 5076
Lead Channel Impedance Value: 510 Ohm
Lead Channel Pacing Threshold Amplitude: 0.75 V
Lead Channel Pacing Threshold Pulse Width: 0.4 ms
Lead Channel Pacing Threshold Pulse Width: 0.4 ms
Lead Channel Setting Pacing Amplitude: 2 V
Lead Channel Setting Pacing Amplitude: 2.5 V
MDC IDC LEAD IMPLANT DT: 20090527
MDC IDC LEAD LOCATION: 753859
MDC IDC MSMT BATTERY IMPEDANCE: 1911 Ohm
MDC IDC MSMT LEADCHNL RA SENSING INTR AMPL: 5.6 mV
MDC IDC MSMT LEADCHNL RV IMPEDANCE VALUE: 539 Ohm
MDC IDC MSMT LEADCHNL RV PACING THRESHOLD AMPLITUDE: 0.75 V
MDC IDC MSMT LEADCHNL RV SENSING INTR AMPL: 4 mV
MDC IDC SESS DTM: 20161228172939
MDC IDC SET LEADCHNL RV PACING PULSEWIDTH: 0.4 ms
MDC IDC SET LEADCHNL RV SENSING SENSITIVITY: 2 mV
MDC IDC STAT BRADY AP VP PERCENT: 1 %
MDC IDC STAT BRADY AP VS PERCENT: 85 %
MDC IDC STAT BRADY AS VS PERCENT: 14 %

## 2015-04-30 ENCOUNTER — Ambulatory Visit (INDEPENDENT_AMBULATORY_CARE_PROVIDER_SITE_OTHER): Payer: Medicare Other | Admitting: Psychology

## 2015-04-30 DIAGNOSIS — F332 Major depressive disorder, recurrent severe without psychotic features: Secondary | ICD-10-CM | POA: Diagnosis not present

## 2015-05-12 ENCOUNTER — Ambulatory Visit: Payer: Medicare Other | Admitting: Psychology

## 2015-05-22 ENCOUNTER — Ambulatory Visit (INDEPENDENT_AMBULATORY_CARE_PROVIDER_SITE_OTHER): Payer: Medicare Other | Admitting: Psychology

## 2015-05-22 DIAGNOSIS — F332 Major depressive disorder, recurrent severe without psychotic features: Secondary | ICD-10-CM

## 2015-05-27 ENCOUNTER — Other Ambulatory Visit: Payer: Self-pay | Admitting: Internal Medicine

## 2015-05-30 DIAGNOSIS — M25562 Pain in left knee: Secondary | ICD-10-CM | POA: Diagnosis not present

## 2015-05-30 DIAGNOSIS — M25561 Pain in right knee: Secondary | ICD-10-CM | POA: Diagnosis not present

## 2015-06-05 DIAGNOSIS — H401292 Low-tension glaucoma, unspecified eye, moderate stage: Secondary | ICD-10-CM | POA: Diagnosis not present

## 2015-06-16 ENCOUNTER — Ambulatory Visit (INDEPENDENT_AMBULATORY_CARE_PROVIDER_SITE_OTHER): Payer: Medicare Other | Admitting: Psychology

## 2015-06-16 DIAGNOSIS — F332 Major depressive disorder, recurrent severe without psychotic features: Secondary | ICD-10-CM | POA: Diagnosis not present

## 2015-06-23 ENCOUNTER — Ambulatory Visit (INDEPENDENT_AMBULATORY_CARE_PROVIDER_SITE_OTHER): Payer: Medicare Other | Admitting: Psychology

## 2015-06-23 DIAGNOSIS — F332 Major depressive disorder, recurrent severe without psychotic features: Secondary | ICD-10-CM | POA: Diagnosis not present

## 2015-06-24 DIAGNOSIS — M25561 Pain in right knee: Secondary | ICD-10-CM | POA: Diagnosis not present

## 2015-06-24 DIAGNOSIS — M1711 Unilateral primary osteoarthritis, right knee: Secondary | ICD-10-CM | POA: Diagnosis not present

## 2015-06-25 ENCOUNTER — Other Ambulatory Visit: Payer: Self-pay | Admitting: Internal Medicine

## 2015-06-27 DIAGNOSIS — M7662 Achilles tendinitis, left leg: Secondary | ICD-10-CM | POA: Diagnosis not present

## 2015-06-27 DIAGNOSIS — M2042 Other hammer toe(s) (acquired), left foot: Secondary | ICD-10-CM | POA: Diagnosis not present

## 2015-06-27 DIAGNOSIS — M722 Plantar fascial fibromatosis: Secondary | ICD-10-CM | POA: Diagnosis not present

## 2015-06-27 DIAGNOSIS — M2041 Other hammer toe(s) (acquired), right foot: Secondary | ICD-10-CM | POA: Diagnosis not present

## 2015-06-28 ENCOUNTER — Other Ambulatory Visit: Payer: Self-pay | Admitting: Internal Medicine

## 2015-06-28 ENCOUNTER — Encounter: Payer: Self-pay | Admitting: Internal Medicine

## 2015-06-28 MED ORDER — PINDOLOL 5 MG PO TABS: 5.0000 mg | ORAL_TABLET | Freq: Every day | ORAL | Status: DC

## 2015-07-01 ENCOUNTER — Ambulatory Visit: Payer: Self-pay | Admitting: Family

## 2015-07-07 ENCOUNTER — Ambulatory Visit (INDEPENDENT_AMBULATORY_CARE_PROVIDER_SITE_OTHER): Payer: Medicare Other | Admitting: Psychology

## 2015-07-07 DIAGNOSIS — F332 Major depressive disorder, recurrent severe without psychotic features: Secondary | ICD-10-CM | POA: Diagnosis not present

## 2015-07-07 DIAGNOSIS — M1711 Unilateral primary osteoarthritis, right knee: Secondary | ICD-10-CM | POA: Diagnosis not present

## 2015-07-08 ENCOUNTER — Ambulatory Visit (HOSPITAL_COMMUNITY): Payer: Self-pay | Admitting: Psychiatry

## 2015-07-14 DIAGNOSIS — M1711 Unilateral primary osteoarthritis, right knee: Secondary | ICD-10-CM | POA: Diagnosis not present

## 2015-07-21 ENCOUNTER — Ambulatory Visit (INDEPENDENT_AMBULATORY_CARE_PROVIDER_SITE_OTHER): Payer: Medicare Other | Admitting: Psychology

## 2015-07-21 ENCOUNTER — Encounter: Payer: Self-pay | Admitting: Internal Medicine

## 2015-07-21 ENCOUNTER — Ambulatory Visit (INDEPENDENT_AMBULATORY_CARE_PROVIDER_SITE_OTHER): Payer: Medicare Other | Admitting: Internal Medicine

## 2015-07-21 ENCOUNTER — Other Ambulatory Visit (INDEPENDENT_AMBULATORY_CARE_PROVIDER_SITE_OTHER): Payer: Medicare Other

## 2015-07-21 VITALS — BP 118/70 | HR 79 | Wt 174.0 lb

## 2015-07-21 DIAGNOSIS — F411 Generalized anxiety disorder: Secondary | ICD-10-CM | POA: Diagnosis not present

## 2015-07-21 DIAGNOSIS — I119 Hypertensive heart disease without heart failure: Secondary | ICD-10-CM | POA: Diagnosis not present

## 2015-07-21 DIAGNOSIS — K589 Irritable bowel syndrome without diarrhea: Secondary | ICD-10-CM

## 2015-07-21 DIAGNOSIS — E034 Atrophy of thyroid (acquired): Secondary | ICD-10-CM

## 2015-07-21 DIAGNOSIS — E038 Other specified hypothyroidism: Secondary | ICD-10-CM

## 2015-07-21 DIAGNOSIS — F332 Major depressive disorder, recurrent severe without psychotic features: Secondary | ICD-10-CM | POA: Diagnosis not present

## 2015-07-21 LAB — BASIC METABOLIC PANEL
BUN: 23 mg/dL (ref 6–23)
CHLORIDE: 102 meq/L (ref 96–112)
CO2: 29 mEq/L (ref 19–32)
Calcium: 9.4 mg/dL (ref 8.4–10.5)
Creatinine, Ser: 0.98 mg/dL (ref 0.40–1.20)
GFR: 69.62 mL/min (ref >60.00)
GLUCOSE: 86 mg/dL (ref 70–99)
POTASSIUM: 4.1 meq/L (ref 3.5–5.1)
Sodium: 138 mEq/L (ref 135–145)

## 2015-07-21 LAB — HEPATIC FUNCTION PANEL
ALBUMIN: 3.9 g/dL (ref 3.5–5.2)
ALT: 9 U/L (ref 0–35)
AST: 14 U/L (ref 0–37)
Alkaline Phosphatase: 70 U/L (ref 39–117)
Bilirubin, Direct: 0.1 mg/dL (ref 0.0–0.3)
TOTAL PROTEIN: 7.1 g/dL (ref 6.0–8.3)
Total Bilirubin: 0.8 mg/dL (ref 0.2–1.2)

## 2015-07-21 LAB — TSH: TSH: 1.36 u[IU]/mL (ref 0.35–4.50)

## 2015-07-21 LAB — URIC ACID: Uric Acid, Serum: 6.2 mg/dL (ref 2.4–7.0)

## 2015-07-21 NOTE — Progress Notes (Signed)
Subjective:  Patient ID: [name], female    DOB: 11/11/1929  Age: 82 y.o. MRN: [id number]  CC: No chief complaint on file.   HPI [facility] presents for foot pain -- B heels (seeing a podiatrist). C/o knee pain. F/u IBS. F/u gout, anxiety, HTN f/u  Outpatient Prescriptions Prior to Visit  Medication Sig Dispense Refill  . allopurinol (ZYLOPRIM) 300 MG tablet Take 1 tablet (300 mg total) by mouth daily. 30 tablet 4  . Cetirizine HCl (ZYRTEC ALLERGY) 10 MG CAPS Take 1 capsule by mouth daily as needed.     . Cholecalciferol (VITAMIN D3) 2000 UNITS TABS Take 1 tablet by mouth as needed.     [name] EPIPEN 2-PAK 0.3 MG/0.3ML DEVI Inject 1 Device into the muscle daily as needed. For allergic reaction    . escitalopram (LEXAPRO) 10 MG tablet Take 1 tablet (10 mg total) by mouth daily. 90 tablet 0  . Kelp 150 MCG TABS Take 150 mg by mouth daily.    [name] levothyroxine (SYNTHROID, LEVOTHROID) 100 MCG tablet TAKE ONE TABLET BY MOUTH ONCE DAILY 90 tablet 0  . LORazepam (ATIVAN) 0.5 MG tablet TAKE ONE TABLET BY MOUTH AS NEEDED FOR ANXIETY 30 tablet 1  . magnesium oxide (MAG-OX) 400 MG tablet Take 400 mg by mouth daily.    [name] oxybutynin (DITROPAN) 5 MG tablet TAKE ONE TABLET BY MOUTH THREE TIMES DAILY 90 tablet 5  . pindolol (VISKEN) 5 MG tablet Take 1 tablet (5 mg total) by mouth daily. 90 tablet 1  . Psyllium (METAMUCIL PO) Once daily    . spironolactone (ALDACTONE) 25 MG tablet TAKE ONE TABLET BY MOUTH ONCE DAILY 30 tablet 11  . triamcinolone ointment (KENALOG) 0.5 % Apply 1 application topically 2 (two) times daily as needed. Apply to affected area twice a day as needed 30 g 2  . XIIDRA 5 % SOLN INSTILL 1 DROP IN EACH EYE 2 TIMES A DAY  6   No facility-administered medications prior to visit.    ROS Review of Systems  Constitutional: Negative for chills, activity change, appetite change, fatigue and unexpected weight change.  HENT: Negative for congestion, mouth sores and sinus pressure.     Eyes: Negative for visual disturbance.  Respiratory: Negative for cough and chest tightness.   Gastrointestinal: Negative for nausea and abdominal pain.  Genitourinary: Negative for frequency, difficulty urinating and vaginal pain.  Musculoskeletal: Negative for back pain and gait problem.  Skin: Negative for pallor and rash.  Neurological: Negative for dizziness, tremors, weakness, numbness and headaches.  Psychiatric/Behavioral: Negative for suicidal ideas, confusion and sleep disturbance.    Objective:  BP 118/70 mmHg  Pulse 79  Wt 174 lb (78.926 kg)  SpO2 97%  BP Readings from Last 3 Encounters:  07/21/15 118/70  04/23/15 122/84  04/09/15 127/78    Wt Readings from Last 3 Encounters:  07/21/15 174 lb (78.926 kg)  04/23/15 176 lb (79.833 kg)  04/09/15 171 lb 12.8 oz (77.928 kg)    Physical Exam  Lab Results  Component Value Date   WBC 4.0 10/22/2014   HGB 13.5 10/22/2014   HCT 40.6 10/22/2014   PLT 212.0 10/22/2014   GLUCOSE 95 07/30/2014   CHOL 174 05/11/2010   TRIG 78.0 05/11/2010   HDL 52.30 05/11/2010   LDLDIRECT 130.8 09/30/2006   LDLCALC 106* 05/11/2010   ALT 12 01/01/2013   AST 19 01/01/2013   NA 134* 07/30/2014   K 4.3 07/30/2014   CL 100 07/30/2014  CREATININE 0.97 07/30/2014   BUN 18 07/30/2014   CO2 29 07/30/2014   TSH 0.38 07/30/2014   INR 1.0 10/22/2014   HGBA1C 6.0 04/10/2012    Dg Chest 2 View  02/13/2015  CLINICAL DATA:  Hemoptysis.  Symptoms for a few weeks. EXAM: CHEST  2 VIEW COMPARISON:  01/22/2014. FINDINGS: Dual lead pacer from RIGHT subclavian approach appropriately positioned. Cardiomegaly. Calcified tortuous aorta. LEFT mastectomy with axillary node dissection. No pulmonary nodules, infiltrates, or failure. No effusion or pneumothorax. No osseous findings. IMPRESSION: Stable chest.  No active disease. Electronically Signed   By: Staci Righter M.D.   On: 02/13/2015 17:06    Assessment & Plan:   There are no diagnoses linked  to this encounter. I am having [name] maintain her Cetirizine HCl, EPIPEN 2-PAK, Psyllium (METAMUCIL PO), magnesium oxide, Vitamin D3, triamcinolone ointment, Kelp, oxybutynin, spironolactone, allopurinol, XIIDRA, LORazepam, escitalopram, levothyroxine, and pindolol.  No orders of the defined types were placed in this encounter.     Follow-up: No Follow-up on file.  Walker Kehr, MD

## 2015-07-21 NOTE — Assessment & Plan Note (Signed)
On Pindolol and Spironolactone 

## 2015-07-21 NOTE — Assessment & Plan Note (Signed)
On Levothroid TSH 

## 2015-07-21 NOTE — Assessment & Plan Note (Signed)
Dr Earlean Shawl On a good diet Gluten free most of the time

## 2015-07-21 NOTE — Assessment & Plan Note (Signed)
Dr Adele Schilder Lexapro Lorazepam  Potential benefits of a long term benzodiazepines  use as well as potential risks  and complications were explained to the patient and were aknowledged.

## 2015-07-21 NOTE — Progress Notes (Signed)
Pre visit review using our clinic review tool, if applicable. No additional management support is needed unless otherwise documented below in the visit note. 

## 2015-07-21 NOTE — Patient Instructions (Signed)
Plantar Fasciitis Plantar fasciitis is a painful foot condition that affects the heel. It occurs when the band of tissue that connects the toes to the heel bone (plantar fascia) becomes irritated. This can happen after exercising too much or doing other repetitive activities (overuse injury). The pain from plantar fasciitis can range from mild irritation to severe pain that makes it difficult for you to walk or move. The pain is usually worse in the morning or after you have been sitting or lying down for a while. CAUSES This condition may be caused by:  Standing for long periods of time.  Wearing shoes that do not fit.  Doing high-impact activities, including running, aerobics, and ballet.  Being overweight.  Having an abnormal way of walking (gait).  Having tight calf muscles.  Having high arches in your feet.  Starting a new athletic activity. SYMPTOMS The main symptom of this condition is heel pain. Other symptoms include:  Pain that gets worse after activity or exercise.  Pain that is worse in the morning or after resting.  Pain that goes away after you walk for a few minutes. DIAGNOSIS This condition may be diagnosed based on your signs and symptoms. Your health care provider will also do a physical exam to check for:  A tender area on the bottom of your foot.  A high arch in your foot.  Pain when you move your foot.  Difficulty moving your foot. You may also need to have imaging studies to confirm the diagnosis. These can include:  X-rays.  Ultrasound.  MRI. TREATMENT  Treatment for plantar fasciitis depends on the severity of the condition. Your treatment may include:  Rest, ice, and over-the-counter pain medicines to manage your pain.  Exercises to stretch your calves and your plantar fascia.  A splint that holds your foot in a stretched, upward position while you sleep (night splint).  Physical therapy to relieve symptoms and prevent problems in the  future.  Cortisone injections to relieve severe pain.  Extracorporeal shock wave therapy (ESWT) to stimulate damaged plantar fascia with electrical impulses. It is often used as a last resort before surgery.  Surgery, if other treatments have not worked after 12 months. HOME CARE INSTRUCTIONS  Take medicines only as directed by your health care provider.  Avoid activities that cause pain.  Roll the bottom of your foot over a bag of ice or a bottle of cold water. Do this for 20 minutes, 3-4 times a day.  Perform simple stretches as directed by your health care provider.  Try wearing athletic shoes with air-sole or gel-sole cushions or soft shoe inserts.  Wear a night splint while sleeping, if directed by your health care provider.  Keep all follow-up appointments with your health care provider. PREVENTION   Do not perform exercises or activities that cause heel pain.  Consider finding low-impact activities if you continue to have problems.  Lose weight if you need to. The best way to prevent plantar fasciitis is to avoid the activities that aggravate your plantar fascia. SEEK MEDICAL CARE IF:  Your symptoms do not go away after treatment with home care measures.  Your pain gets worse.  Your pain affects your ability to move or do your daily activities.   This information is not intended to replace advice given to you by your health care provider. Make sure you discuss any questions you have with your health care provider.   Document Released: 01/05/2001 Document Revised: 01/01/2015 Document Reviewed: 02/20/2014 Elsevier   Interactive Patient Education 2016 Elsevier Inc.  

## 2015-07-23 ENCOUNTER — Ambulatory Visit (HOSPITAL_COMMUNITY): Payer: Self-pay | Admitting: Psychiatry

## 2015-07-23 ENCOUNTER — Telehealth: Payer: Self-pay | Admitting: Cardiology

## 2015-07-23 ENCOUNTER — Ambulatory Visit (INDEPENDENT_AMBULATORY_CARE_PROVIDER_SITE_OTHER): Payer: Medicare Other | Admitting: *Deleted

## 2015-07-23 DIAGNOSIS — I495 Sick sinus syndrome: Secondary | ICD-10-CM | POA: Diagnosis not present

## 2015-07-23 DIAGNOSIS — M1711 Unilateral primary osteoarthritis, right knee: Secondary | ICD-10-CM | POA: Diagnosis not present

## 2015-07-23 NOTE — Telephone Encounter (Signed)
Spoke with pt and reminded pt of remote transmission that is due today. Pt verbalized understanding.   

## 2015-07-23 NOTE — Progress Notes (Signed)
Remote pacemaker transmission.   

## 2015-07-27 ENCOUNTER — Encounter: Payer: Self-pay | Admitting: Internal Medicine

## 2015-07-30 ENCOUNTER — Ambulatory Visit (INDEPENDENT_AMBULATORY_CARE_PROVIDER_SITE_OTHER): Payer: Medicare Other | Admitting: Psychiatry

## 2015-07-30 ENCOUNTER — Encounter (HOSPITAL_COMMUNITY): Payer: Self-pay | Admitting: Psychiatry

## 2015-07-30 VITALS — BP 118/74 | HR 79 | Ht 64.0 in | Wt 171.6 lb

## 2015-07-30 DIAGNOSIS — F330 Major depressive disorder, recurrent, mild: Secondary | ICD-10-CM | POA: Diagnosis not present

## 2015-07-30 DIAGNOSIS — M1711 Unilateral primary osteoarthritis, right knee: Secondary | ICD-10-CM | POA: Diagnosis not present

## 2015-07-30 MED ORDER — ESCITALOPRAM OXALATE 10 MG PO TABS: 10.0000 mg | ORAL_TABLET | Freq: Every day | ORAL | Status: DC

## 2015-07-30 MED ORDER — LORAZEPAM 0.5 MG PO TABS: ORAL_TABLET | ORAL | Status: DC

## 2015-07-30 NOTE — Progress Notes (Signed)
[facility] [phone] Progress Note  Eshita Cosley [id number] 82 y.o.  07/30/2015 2:22 PM  Chief Complaint:  Medication management and followup.       History presenting illness Sumaia came for her followup appointment.  She is complaining of knee pain and difficulty walking.  Recently she has given injection in her joints and she is hoping that her pain get better.  She is also stressed about her son who recently moved in with his son since he was evicted from his house last year.  Patient told his son and grandson are looking for a house and until then they are living with her.  Patient admitted it causing some friction because she does not like dirty house and they leave trails in the house.  She is hoping once his son find the place they will move .  Overall she describes her depression is situational due to chronic pain and living situation.  However she is taking the Lexapro every day and does not feel she need a higher dose at this time.  She still taking lorazepam only as needed.  She denies any major panic attack.  Recently she's seen primary care physician and she had lab work.  Patient denies any irritability, anger, mood swing.  She denies any feeling of hopelessness or worthlessness.  Her energy level is okay.  Her appetite is okay.  Her vitals are stable.  Patient denies drinking or using any illegal substances.    Suicidal Ideation: No Plan Formed: No Patient has means to carry out plan: No  Homicidal Ideation: No Plan Formed: No Patient has means to carry out plan: No  Review of Systems: Psychiatric: Agitation: No Hallucination: No Depressed Mood: No Insomnia: No Hypersomnia: No Altered Concentration: No Feels Worthless: No Grandiose Ideas: No Belief In Special Powers: No New/Increased Substance Abuse: No Compulsions: No  Neurologic: Headache: No Seizure: No Paresthesias: No Review of Systems  Musculoskeletal: Positive for joint pain.     Medical  history Patient has history of hypothyroidism, vitamin D deficiency, hyperlipidemia, hypertension, allergic rhinitis, GERD, and a fissure, cystitis, chronic diarrhea, afibrillation, osteoporosis, chronic fatigue, history of breast cancer, hearing loss, irritable bladder, chronic knee pain and dysphagia.  Her primary care physician is Dr. Richardson Landry.  Outpatient Encounter Prescriptions as of 07/30/2015  Medication Sig  . allopurinol (ZYLOPRIM) 300 MG tablet Take 1 tablet (300 mg total) by mouth daily.  . Cetirizine HCl (ZYRTEC ALLERGY) 10 MG CAPS Take 1 capsule by mouth daily as needed.   . Cholecalciferol (VITAMIN D3) 2000 UNITS TABS Take 1 tablet by mouth as needed.   [name] EPIPEN 2-PAK 0.3 MG/0.3ML DEVI Inject 1 Device into the muscle daily as needed. For allergic reaction  . escitalopram (LEXAPRO) 10 MG tablet Take 1 tablet (10 mg total) by mouth daily.  [name] Kelp 150 MCG TABS Take 150 mg by mouth daily.  [name] levothyroxine (SYNTHROID, LEVOTHROID) 100 MCG tablet TAKE ONE TABLET BY MOUTH ONCE DAILY  . LORazepam (ATIVAN) 0.5 MG tablet TAKE ONE TABLET BY MOUTH AS NEEDED FOR ANXIETY  . magnesium oxide (MAG-OX) 400 MG tablet Take 400 mg by mouth daily.  [name] oxybutynin (DITROPAN) 5 MG tablet TAKE ONE TABLET BY MOUTH THREE TIMES DAILY  . pindolol (VISKEN) 5 MG tablet Take 1 tablet (5 mg total) by mouth daily.  . Psyllium (METAMUCIL PO) Once daily  . spironolactone (ALDACTONE) 25 MG tablet TAKE ONE TABLET BY MOUTH ONCE DAILY  . triamcinolone ointment (KENALOG) 0.5 % Apply 1  application topically 2 (two) times daily as needed. Apply to affected area twice a day as needed  . XIIDRA 5 % SOLN INSTILL 1 DROP IN EACH EYE 2 TIMES A DAY  . [DISCONTINUED] escitalopram (LEXAPRO) 10 MG tablet Take 1 tablet (10 mg total) by mouth daily.  . [DISCONTINUED] LORazepam (ATIVAN) 0.5 MG tablet TAKE ONE TABLET BY MOUTH AS NEEDED FOR ANXIETY   No facility-administered encounter medications on file as of 07/30/2015.   Recent Results  (from the past 2160 hour(s))  Uric acid     Status: None   Collection Time: 07/21/15  4:24 PM  Result Value Ref Range   Uric Acid, Serum 6.2 2.4 - 7.0 mg/dL  Basic metabolic panel     Status: None   Collection Time: 07/21/15  4:24 PM  Result Value Ref Range   Sodium 138 135 - 145 mEq/L   Potassium 4.1 3.5 - 5.1 mEq/L   Chloride 102 96 - 112 mEq/L   CO2 29 19 - 32 mEq/L   Glucose, Bld 86 70 - 99 mg/dL   BUN 23 6 - 23 mg/dL   Creatinine, Ser 0.98 0.40 - 1.20 mg/dL   Calcium 9.4 8.4 - 10.5 mg/dL   GFR 69.62 >60.00 mL/min  Hepatic function panel     Status: None   Collection Time: 07/21/15  4:24 PM  Result Value Ref Range   Total Bilirubin 0.8 0.2 - 1.2 mg/dL   Bilirubin, Direct 0.1 0.0 - 0.3 mg/dL   Alkaline Phosphatase 70 39 - 117 U/L   AST 14 0 - 37 U/L   ALT 9 0 - 35 U/L   Total Protein 7.1 6.0 - 8.3 g/dL   Albumin 3.9 3.5 - 5.2 g/dL  TSH     Status: None   Collection Time: 07/21/15  4:24 PM  Result Value Ref Range   TSH 1.36 0.35 - 4.50 uIU/mL   Past Psychiatric History/Hospitalization(s): Anxiety: Yes Bipolar Disorder: No Depression: Yes Mania: No Psychosis: No Schizophrenia: No Personality Disorder: No Hospitalization for psychiatric illness: Yes History of Electroconvulsive Shock Therapy: No Prior Suicide Attempts: No  Physical Exam: Constitutional:  BP 118/74 mmHg  Pulse 79  Ht 5\' 4"  (1.626 m)  Wt 171 lb 9.6 oz (77.837 kg)  BMI 29.44 kg/m2  Mental status examination Patient is casually dressed and fairly groomed.  She is complaining of pain in her joints but she is cooperative and maintained fair eye contact.  Her speech is soft, clear and coherent.  She described her mood euthymic and her affect is appropriate.  She denies any active or passive suicidal thoughts or homicidal thoughts.  There were no paranoia or delusions present at this time.   Her psychomotor activity is slow.  Her fund of knowledge is good.  Her attention concentration is okay.  She is  alert and oriented x3.  Her insight judgment and impulse control is okay.  Established Problem, Stable/Improving (1), Review of Psycho-Social Stressors (1), Review of Last Therapy Session (1) and Review of Medication Regimen & Side Effects (2)  Assessment: Axis I: Maj. depressive disorder  Axis II: Deferred  Axis III: See medical history  Plan:  Reassurance given.  Discussed psychosocial stressors.  Encouraged to keep appointment with Dr. Cheryln Manly for counseling.  One more time off work increase Lexapro but patient declined.  Patient denies any side effects of medication.  I will continue Lexapro 10 mg daily and Ativan 0.5 mg as needed.  Discussed medication side effects and  benefits.  She has no concerns or any side effects. I will see her in 3 months however recommended to call us back if she has any question, concern if she feel worsening of the symptom.  Carolena Fairbank T., MD 07/30/2015

## 2015-07-31 DIAGNOSIS — M1711 Unilateral primary osteoarthritis, right knee: Secondary | ICD-10-CM | POA: Diagnosis not present

## 2015-08-01 DIAGNOSIS — M722 Plantar fascial fibromatosis: Secondary | ICD-10-CM | POA: Diagnosis not present

## 2015-08-04 ENCOUNTER — Ambulatory Visit (INDEPENDENT_AMBULATORY_CARE_PROVIDER_SITE_OTHER): Payer: Medicare Other | Admitting: Psychology

## 2015-08-04 DIAGNOSIS — F332 Major depressive disorder, recurrent severe without psychotic features: Secondary | ICD-10-CM | POA: Diagnosis not present

## 2015-08-05 LAB — CUP PACEART REMOTE DEVICE CHECK
Battery Remaining Longevity: 30 mo
Brady Statistic AP VP Percent: 1 %
Brady Statistic AS VP Percent: 0 %
Brady Statistic AS VS Percent: 15 %
Date Time Interrogation Session: 20170329141231
Implantable Lead Implant Date: 20090527
Implantable Lead Location: 753859
Implantable Lead Model: 5076
Lead Channel Pacing Threshold Amplitude: 0.5 V
Lead Channel Pacing Threshold Amplitude: 0.75 V
Lead Channel Pacing Threshold Pulse Width: 0.4 ms
Lead Channel Pacing Threshold Pulse Width: 0.4 ms
Lead Channel Sensing Intrinsic Amplitude: 5.6 mV
Lead Channel Setting Pacing Pulse Width: 0.4 ms
Lead Channel Setting Sensing Sensitivity: 2.8 mV
MDC IDC LEAD IMPLANT DT: 20090527
MDC IDC LEAD LOCATION: 753860
MDC IDC MSMT BATTERY IMPEDANCE: 2131 Ohm
MDC IDC MSMT BATTERY VOLTAGE: 2.75 V
MDC IDC MSMT LEADCHNL RA IMPEDANCE VALUE: 529 Ohm
MDC IDC MSMT LEADCHNL RV IMPEDANCE VALUE: 572 Ohm
MDC IDC SET LEADCHNL RA PACING AMPLITUDE: 2 V
MDC IDC SET LEADCHNL RV PACING AMPLITUDE: 2.5 V
MDC IDC STAT BRADY AP VS PERCENT: 83 %

## 2015-08-06 ENCOUNTER — Encounter: Payer: Self-pay | Admitting: Cardiology

## 2015-08-06 DIAGNOSIS — M1711 Unilateral primary osteoarthritis, right knee: Secondary | ICD-10-CM | POA: Diagnosis not present

## 2015-08-12 DIAGNOSIS — R042 Hemoptysis: Secondary | ICD-10-CM | POA: Diagnosis not present

## 2015-08-12 DIAGNOSIS — R0602 Shortness of breath: Secondary | ICD-10-CM | POA: Diagnosis not present

## 2015-08-12 DIAGNOSIS — J302 Other seasonal allergic rhinitis: Secondary | ICD-10-CM | POA: Diagnosis not present

## 2015-08-12 DIAGNOSIS — J301 Allergic rhinitis due to pollen: Secondary | ICD-10-CM | POA: Diagnosis not present

## 2015-08-18 ENCOUNTER — Ambulatory Visit (INDEPENDENT_AMBULATORY_CARE_PROVIDER_SITE_OTHER): Payer: Medicare Other | Admitting: Psychology

## 2015-08-18 DIAGNOSIS — F331 Major depressive disorder, recurrent, moderate: Secondary | ICD-10-CM

## 2015-08-29 ENCOUNTER — Other Ambulatory Visit: Payer: Self-pay | Admitting: Internal Medicine

## 2015-08-29 MED ORDER — LEVOTHYROXINE SODIUM 100 MCG PO TABS: 100.0000 ug | ORAL_TABLET | Freq: Every day | ORAL | Status: DC

## 2015-09-01 ENCOUNTER — Ambulatory Visit (INDEPENDENT_AMBULATORY_CARE_PROVIDER_SITE_OTHER): Payer: Medicare Other | Admitting: Psychology

## 2015-09-01 DIAGNOSIS — F331 Major depressive disorder, recurrent, moderate: Secondary | ICD-10-CM | POA: Diagnosis not present

## 2015-09-03 DIAGNOSIS — M25562 Pain in left knee: Secondary | ICD-10-CM | POA: Diagnosis not present

## 2015-09-04 ENCOUNTER — Ambulatory Visit (INDEPENDENT_AMBULATORY_CARE_PROVIDER_SITE_OTHER): Payer: Medicare Other | Admitting: Internal Medicine

## 2015-09-04 ENCOUNTER — Encounter: Payer: Self-pay | Admitting: Internal Medicine

## 2015-09-04 VITALS — BP 140/82 | HR 70 | Ht 63.0 in | Wt 173.0 lb

## 2015-09-04 DIAGNOSIS — R042 Hemoptysis: Secondary | ICD-10-CM

## 2015-09-04 DIAGNOSIS — J387 Other diseases of larynx: Secondary | ICD-10-CM | POA: Diagnosis not present

## 2015-09-04 DIAGNOSIS — R053 Chronic cough: Secondary | ICD-10-CM

## 2015-09-04 DIAGNOSIS — R0982 Postnasal drip: Secondary | ICD-10-CM

## 2015-09-04 DIAGNOSIS — R05 Cough: Secondary | ICD-10-CM

## 2015-09-04 DIAGNOSIS — M722 Plantar fascial fibromatosis: Secondary | ICD-10-CM | POA: Diagnosis not present

## 2015-09-04 LAB — NITRIC OXIDE: Nitric Oxide: 41

## 2015-09-04 MED ORDER — FLUTICASONE FUROATE-VILANTEROL 200-25 MCG/INH IN AEPB: 1.0000 | INHALATION_SPRAY | Freq: Every day | RESPIRATORY_TRACT | Status: DC

## 2015-09-04 MED ORDER — ALBUTEROL SULFATE HFA 108 (90 BASE) MCG/ACT IN AERS: 2.0000 | INHALATION_SPRAY | Freq: Four times a day (QID) | RESPIRATORY_TRACT | Status: DC | PRN

## 2015-09-04 NOTE — Progress Notes (Signed)
Subjective:    Patient ID: [name], female    DOB: 29-Jan-1930, 82 y.o.   MRN: [id number]  PCP Walker Kehr, MD   HPI  Brief patient profile:  80 yobf never smoker with h/o sneezing/itching/coughing  eval 1979 and placed on allergy shots ever since but nose runs year round  if doesn't  take zyrtec developed new unexplained doe 2014 self referred to pulmonary clinic 06/20/2013 with nl  pfts 07/02/13     History of Present Illness  06/20/2013 1st [facility] office visit/ Wert cc progressive doe x one year (chart suggests dates back to 2012 however) indolent onset to point where can't do steps at [place], can't finish silver sneakers, can't walk a mile, can do grocery shopping but has to rest before going across the parking lot.  Hb during this time worse than usual on aciphex ac and pc rec Stop visken and start bystolic 5 mg one twice daily  Continue aciphex 20 mg Take 30- 60 min before your first and last meals of the day    07/02/2013 f/u ov/Wert re: unexplained sob Chief Complaint  Patient presents with  . Follow-up    Pt reports her breathing is unchanged since her last visit. She c/o HA since she started taking bystolic.   ha is random, transient, not related to time of day or activity, generalized, s nausea of neuro/viz cc's rec Finish your cardiac evaluation first, then if not doing better call [id number] for CPST with spirometry before and after > neg myoview, was not physically stressed at any point Zebeta 5 mg reduce to  one half tablet daily  Take only the medications on your list as they are on your after visit summary  If heart burn acts up, ok to resume aciphex 20 mg  > it did flare so she continue aciphe bid    08/07/2013 f/u ov/Wert re: unexplained doe, takes various alternative rx/ "nothing worksAdvertising account planner Complaint  Patient presents with  . Follow-up    Breathing is unchanged since last visit. She states that Cardiology ruled out any heart problems and  advised her to f/u here.    no better or worse doe since onset, never occurs at rest or sleeping, still can't do a grocery store s stopping to rest  rec Continue on current regimen   Follow med calendar closely and bring to each visit.   08/23/2013 followup and medication review. Patient returns for a followup visit and medication review. Reviewed all her medications and organized them into a medication calendar. Patient education. Appears the patient is taking her medication currently. Says her dyspnea is unchanged since last ov; no new complaints. rec Continue on current regimen  Labs today.  Follow med calendar closely and bring to each visit   09/20/2013 f/u ov/Wert re: sob x much worse since  first of the year/ no med calendar Chief Complaint  Patient presents with  . Shortness of Breath    Breathing is unchanged. Still reports DOE. Denies chest tightness or coughing.  no resting or noct symptoms - doe not really worse since onset   No obvious day to day or daytime variabilty or assoc chronic cough or cp or chest tightness, subjective wheeze overt sinus or hb symptoms. No unusual exp hx or h/o childhood pna/ asthma or knowledge of premature birth.  Sleeping ok without nocturnal  or early am exacerbation  of respiratory  c/o's or need for noct saba. Also denies any obvious fluctuation  of symptoms with weather or environmental changes or other aggravating or alleviating factors except as outlined above OV 09/04/2015      Chief Complaint  Patient presents with  . Pulmonary Consult    Pt referred by Dr. Harold Hedge. Pt c/o blood tinged sputum when waking in morning x 1 year. Pt c/o intermittent DOE. Pt denies CP/tightness.        Referred by Dr. Fredderick Phenix for hemoptysis. Patient herself is a poor historian. She was last seen by Dr. Melvyn Novas in our office in May 2015 and discharged from follow-up. As best as I can gather in reviewing outside chart, old chart and talking to the  patient she seems to have a chronic cough for the last 1 year. She says it is mild but on RSI cough score below it is obviously a lot more severe in symptoms with a score of 29. She says early in the morning when she gets up she notices a mild amount of hemoptysis. Although when I questioned her if she is coughing it up she says it comes from her throat. Tablet every other day. This been no weight loss since then. This no shortness of breath or wheezing. She follows at the [facility] for 1 or 2 decades. She's been on allergy shots to a few years ago. And then that was stopped. She is on follow-up with antihistamines but review of the records. She denies any shortness of breath. She is a nonsmoker. She does complain of scratchy throat and postnasal drip. She's never seen a ENT physician. She seems to think all her cough is due to acid reflux  no matter what I tried to explain   Exhaled nitric oxide in our office today 09/04/2015 - slightly elevated at 41 ppb; denies having been on any asthma inhalers for many years  She is unsure if she wants to do testing for hemoptysis ".doc do what you  need to do"  Prior imaging reviewed and visualized 04/16/2011 she had CT angiogram chest that ruled out pulmonary embolism. Pulmonary parenchyma was clear of infiltrates most recently she had chest x-ray 02/13/2015 that is clear of any infiltrates. She had full pulmonary function test March 2015 that I personally visualized and looks normal except for isolated low diffusion capacity of 61%. She had echocardiogram February 2015 that shows mild diastolic dysfunction she's not on any anticoagulants. According   Dr Lorenza Cambridge Reflux Symptom Index (> 13-15 suggestive of LPR cough) 0 -> 5  =  none ->severe problem 09/04/2015   Hoarseness of problem with voice 1  Clearing  Of Throat 2  Excess throat mucus or feeling of post nasal drip 5  Difficulty swallowing food, liquid or tablets 0  Cough after eating or lying  down 5  Breathing difficulties or choking episodes 4  Troublesome or annoying cough 3  Sensation of something sticking in throat or lump in throat 4  Heartburn, chest pain, indigestion, or stomach acid coming up 5  TOTAL 29        has a past medical history of Thyroid disease; Glaucoma; IBS (irritable bowel syndrome); Incontinence; GERD (gastroesophageal reflux disease); Hypertension; Anxiety; Depression; breast cancer; Osteoarthritis; Osteopenia; Bradycardia; IBS (irritable bowel syndrome); Discoid lupus; Pacemaker; Full dentures; Wears hearing aid; echocardiogram; exercise stress test; and Pacemaker.   reports that she has never smoked. She has never used smokeless tobacco.  Past Surgical History  Procedure Laterality Date  . Pacemaker placement  2009  . Foot surgery  2004  Left-hammer toes  . Mastectomy  2002    bilat mastectomy-snbx  . Breast lumpectomy  1995    lt -axillary node dissectoin  . Dorsal compartment release  2000    left  . Abdominal hysterectomy    . Eye surgery  2011    cataract-lt  . Knee arthroscopy Left 08/29/2012    Procedure: LEFT KNEE ARTHROSCOPY ;  Surgeon: Hessie Dibble, MD;  Location: [facility];  Service: Orthopedics;  Laterality: Left;  . Trigger finger release Right 07/31/2013    Procedure: EXCISE MASS RIGHT INDEX A-1 PLLLEY RELEASE A-1 RIGHT INDEX;  Surgeon: Cammie Sickle., MD;  Location: [facility];  Service: Orthopedics;  Laterality: Right;    Allergies  Allergen Reactions  . Lactase Diarrhea  . Amlodipine Besy-Benazepril Hcl Swelling  . Amlodipine Besylate Swelling  . Clonidine Hydrochloride     REACTION: tired  . Lactose Intolerance (Gi) Other (See Comments)  . Valsartan Other (See Comments)    Tongue swelling   . Verapamil     There is no immunization history for the selected administration types on file for this patient.  Family History  Problem Relation Age of Onset  . Ovarian cancer Mother    . Heart disease Father   . Stroke Sister   . Stroke Brother   . Cancer Brother   . Heart disease Brother      Current outpatient prescriptions:  .  allopurinol (ZYLOPRIM) 300 MG tablet, Take 1 tablet (300 mg total) by mouth daily., Disp: 30 tablet, Rfl: 4 .  Cetirizine HCl (ZYRTEC ALLERGY) 10 MG CAPS, Take 1 capsule by mouth daily as needed. , Disp: , Rfl:  .  Cholecalciferol (VITAMIN D3) 2000 UNITS TABS, Take 1 tablet by mouth as needed. , Disp: , Rfl:  .  escitalopram (LEXAPRO) 10 MG tablet, Take 1 tablet (10 mg total) by mouth daily., Disp: 90 tablet, Rfl: 0 .  levothyroxine (SYNTHROID, LEVOTHROID) 100 MCG tablet, Take 1 tablet (100 mcg total) by mouth daily., Disp: 90 tablet, Rfl: 3 .  LORazepam (ATIVAN) 0.5 MG tablet, TAKE ONE TABLET BY MOUTH AS NEEDED FOR ANXIETY, Disp: 30 tablet, Rfl: 1 .  magnesium oxide (MAG-OX) 400 MG tablet, Take 400 mg by mouth daily., Disp: , Rfl:  .  pindolol (VISKEN) 5 MG tablet, Take 1 tablet (5 mg total) by mouth daily., Disp: 90 tablet, Rfl: 1 .  Psyllium (METAMUCIL PO), Once daily, Disp: , Rfl:  .  spironolactone (ALDACTONE) 25 MG tablet, TAKE ONE TABLET BY MOUTH ONCE DAILY, Disp: 30 tablet, Rfl: 11     Review of Systems  Constitutional: Negative for fever and unexpected weight change.  HENT: Negative for congestion, dental problem, ear pain, nosebleeds, postnasal drip, rhinorrhea, sinus pressure, sneezing, sore throat and trouble swallowing.   Eyes: Negative for redness and itching.  Respiratory: Positive for cough. Negative for chest tightness, shortness of breath and wheezing.   Cardiovascular: Negative for palpitations and leg swelling.  Gastrointestinal: Negative for nausea and vomiting.  Genitourinary: Negative for dysuria.  Musculoskeletal: Negative for joint swelling.  Skin: Negative for rash.  Neurological: Negative for headaches.  Hematological: Does not bruise/bleed easily.  Psychiatric/Behavioral: Negative for dysphoric mood. The  patient is not nervous/anxious.        Objective:   Physical Exam  Constitutional: She is oriented to person, place, and time. She appears well-developed and well-nourished. No distress.  HENT:  Head: Normocephalic and atraumatic.  Right Ear: External ear  normal.  Left Ear: External ear normal.  Mouth/Throat: Oropharynx is clear and moist. No oropharyngeal exudate.  Eyes: Conjunctivae and EOM are normal. Pupils are equal, round, and reactive to light. Right eye exhibits no discharge. Left eye exhibits no discharge. No scleral icterus.  Neck: Normal range of motion. Neck supple. No JVD present. No tracheal deviation present. No thyromegaly present.  Cardiovascular: Normal rate, regular rhythm, normal heart sounds and intact distal pulses.  Exam reveals no gallop and no friction rub.   No murmur heard. Pulmonary/Chest: Effort normal and breath sounds normal. No respiratory distress. She has no wheezes. She has no rales. She exhibits no tenderness.  Abdominal: Soft. Bowel sounds are normal. She exhibits no distension and no mass. There is no tenderness. There is no rebound and no guarding.  Musculoskeletal: Normal range of motion. She exhibits no edema or tenderness.  Antalgic gait Cane +  Lymphadenopathy:    She has no cervical adenopathy.  Neurological: She is alert and oriented to person, place, and time. She has normal reflexes. No cranial nerve deficit. She exhibits normal muscle tone. Coordination normal.  Skin: Skin is warm and dry. No rash noted. She is not diaphoretic. No erythema. No pallor.  Psychiatric: Her behavior is normal.  Very poor historian  Vitals reviewed.   Filed Vitals:   09/04/15 1105  BP: 140/82  Pulse: 70  Height: 5\' 3"  (1.6 m)  Weight: 173 lb (78.472 kg)  SpO2: 98%          Assessment & Plan:     ICD-9-CM ICD-10-CM   1. Hemoptysis 786.30 R04.2   2. Chronic cough 786.2 R05   3. Postnasal drip 784.91 R09.82   4. Irritable larynx 478.79 J38.7      - refer ENT  - start breo high dose 1 puff daily -  - start albuterol as needed - do CT sinus without contrast and HRCT chest  followup  1 month but after completing above  - consider bronch depending on above results  She is agreeable with plan   Dr. Brand Males, M.D., [facility].C.P Pulmonary and Critical Care Medicine Staff Physician [facility] Pager: [phone], If no answer or between  15:00h - 7:00h: call [phone]  09/04/2015 11:50 AM

## 2015-09-04 NOTE — Patient Instructions (Signed)
ICD-9-CM ICD-10-CM   1. Hemoptysis 786.30 R04.2   2. Chronic cough 786.2 R05   3. Postnasal drip 784.91 R09.82   4. Irritable larynx 478.79 J38.7     - refer ENT  - start breo high dose 1 puff daily -  - start albuterol as needed - do CT sinus without contrast and HRCT chest  followup  1 month but after completing above  - consider bronch depending on above results

## 2015-09-04 NOTE — Addendum Note (Signed)
Addended by: Collier Salina on: 09/04/2015 11:55 AM   Modules accepted: Orders

## 2015-09-04 NOTE — Addendum Note (Signed)
Addended by: Collier Salina on: 09/04/2015 02:56 PM   Modules accepted: Orders

## 2015-09-10 ENCOUNTER — Ambulatory Visit (INDEPENDENT_AMBULATORY_CARE_PROVIDER_SITE_OTHER)
Admission: RE | Admit: 2015-09-10 | Discharge: 2015-09-10 | Disposition: A | Discharge status: Home / Self Care | Payer: Medicare Other | Source: Ambulatory Visit | Attending: Internal Medicine | Admitting: Internal Medicine

## 2015-09-10 ENCOUNTER — Encounter: Payer: Self-pay | Admitting: Internal Medicine

## 2015-09-10 DIAGNOSIS — R042 Hemoptysis: Secondary | ICD-10-CM

## 2015-09-10 DIAGNOSIS — R05 Cough: Secondary | ICD-10-CM

## 2015-09-10 DIAGNOSIS — R0602 Shortness of breath: Secondary | ICD-10-CM | POA: Diagnosis not present

## 2015-09-10 DIAGNOSIS — R053 Chronic cough: Secondary | ICD-10-CM

## 2015-09-11 ENCOUNTER — Other Ambulatory Visit: Payer: Self-pay | Admitting: Internal Medicine

## 2015-09-11 DIAGNOSIS — L931 Subacute cutaneous lupus erythematosus: Secondary | ICD-10-CM | POA: Diagnosis not present

## 2015-09-11 DIAGNOSIS — D1722 Benign lipomatous neoplasm of skin and subcutaneous tissue of left arm: Secondary | ICD-10-CM | POA: Diagnosis not present

## 2015-09-11 MED ORDER — SPIRONOLACTONE 25 MG PO TABS: 25.0000 mg | ORAL_TABLET | Freq: Every day | ORAL | Status: DC

## 2015-09-12 ENCOUNTER — Telehealth: Payer: Self-pay | Admitting: Internal Medicine

## 2015-09-12 DIAGNOSIS — R9389 Abnormal findings on diagnostic imaging of other specified body structures: Secondary | ICD-10-CM

## 2015-09-12 NOTE — Telephone Encounter (Signed)
Ct sinus - norma CT ches t - no ILD  But does show pulm htn which is there in echo too - refer Dr [name] for eval of right heart cath or Dr Angelena Form    Ct Chest High Resolution  09/10/2015  CLINICAL DATA:  Shortness of breath on exertion. Blood tinged sputum in the morning. EXAM: CT CHEST WITHOUT CONTRAST TECHNIQUE: Multidetector CT imaging of the chest was performed following the standard protocol without intravenous contrast. High resolution imaging of the lungs, as well as inspiratory and expiratory imaging, was performed. COMPARISON:  04/16/2011. FINDINGS: Mediastinum/Lymph Nodes: No pathologically enlarged mediastinal or axillary lymph nodes. There are surgical clips in the left axilla. Hilar regions are difficult to definitively evaluate without IV contrast. Ascending aorta measures approximately 4.0 cm. Pulmonary arteries and heart are enlarged. No pericardial effusion. Small hiatal hernia. Lungs/Pleura: Minimal scattered scarring and volume loss at the lung bases. No subpleural reticulation, traction bronchiectasis/ bronchiolectasis, ground-glass or architectural distortion. A few scattered pulmonary nodules measure up to 3 mm in the medial right lower lobe, stable. No pleural fluid. Airway is unremarkable. No air trapping. Upper abdomen: Visualized portions of the liver, gallbladder, adrenal glands, left kidney, spleen, pancreas, stomach and bowel are grossly unremarkable with exception of a small hiatal hernia. Upper abdominal lymph nodes are not enlarged by CT size criteria. Musculoskeletal: No worrisome lytic or sclerotic lesions. IMPRESSION: 1. No evidence of interstitial lung disease. 2. Ascending aortic aneurysm. Recommend annual imaging followup by CTA or MRA. This recommendation follows 2010 ACCF/AHA/AATS/ACR/ASA/SCA/SCAI/SIR/STS/SVM Guidelines for the Diagnosis and Management of Patients with Thoracic Aortic Disease. Circulation. 2010; 121: [id number]. 3. Enlarged pulmonary arteries,  indicative of pulmonary arterial hypertension. Electronically Signed   By: Lorin Picket M.D.   On: 09/10/2015 16:26   Floyd Cm  09/10/2015  CLINICAL DATA:  Intermittent cough with blood-tinged sputum. Itchy throat with dyspnea on exertion. Evaluate for sinusitis. EXAM: CT PARANASAL SINUS LIMITED WITHOUT CONTRAST TECHNIQUE: Non-contiguous multidetector CT images of the paranasal sinuses were obtained in a single plane without contrast. COMPARISON:  Head CT 05/28/2012. FINDINGS: Non contiguous axial images were obtained through the paranasal sinuses with the patient supine. The sinuses are clear without mucosal thickening or air-fluid levels. The visualized mastoid air cells and middle ears are clear. The visualized orbital and intracranial contents are unremarkable. IMPRESSION: No evidence of active sinus disease. Electronically Signed   By: Richardean Sale M.D.   On: 09/10/2015 17:13

## 2015-09-15 ENCOUNTER — Ambulatory Visit: Payer: Medicare Other | Admitting: Psychology

## 2015-09-15 DIAGNOSIS — K219 Gastro-esophageal reflux disease without esophagitis: Secondary | ICD-10-CM | POA: Diagnosis not present

## 2015-09-16 NOTE — Telephone Encounter (Signed)
Spoke with pt, aware of results/recs.  Referral placed to cards.  Nothing further needed.

## 2015-09-18 DIAGNOSIS — R05 Cough: Secondary | ICD-10-CM | POA: Diagnosis not present

## 2015-09-18 DIAGNOSIS — J309 Allergic rhinitis, unspecified: Secondary | ICD-10-CM | POA: Diagnosis not present

## 2015-09-25 DIAGNOSIS — M722 Plantar fascial fibromatosis: Secondary | ICD-10-CM | POA: Diagnosis not present

## 2015-09-29 ENCOUNTER — Ambulatory Visit (INDEPENDENT_AMBULATORY_CARE_PROVIDER_SITE_OTHER): Payer: Medicare Other | Admitting: Psychology

## 2015-09-29 DIAGNOSIS — F331 Major depressive disorder, recurrent, moderate: Secondary | ICD-10-CM

## 2015-09-30 ENCOUNTER — Ambulatory Visit (INDEPENDENT_AMBULATORY_CARE_PROVIDER_SITE_OTHER): Payer: Medicare Other | Admitting: Internal Medicine

## 2015-09-30 ENCOUNTER — Encounter: Payer: Self-pay | Admitting: Internal Medicine

## 2015-09-30 VITALS — BP 134/78 | HR 71 | Ht 63.0 in | Wt 171.4 lb

## 2015-09-30 DIAGNOSIS — J4530 Mild persistent asthma, uncomplicated: Secondary | ICD-10-CM | POA: Diagnosis not present

## 2015-09-30 DIAGNOSIS — R05 Cough: Secondary | ICD-10-CM | POA: Diagnosis not present

## 2015-09-30 DIAGNOSIS — I272 Other secondary pulmonary hypertension: Secondary | ICD-10-CM | POA: Diagnosis not present

## 2015-09-30 DIAGNOSIS — R053 Chronic cough: Secondary | ICD-10-CM

## 2015-09-30 DIAGNOSIS — R042 Hemoptysis: Secondary | ICD-10-CM | POA: Diagnosis not present

## 2015-09-30 NOTE — Patient Instructions (Signed)
ICD-9-CM ICD-10-CM   1. Hemoptysis 786.30 R04.2   2. Chronic cough 786.2 R05   3. Mild persistent asthma, uncomplicated 123456 [id number]   4. Pulmonary hypertension (HCC) 416.8 I27.2    Hemoptysis - one time and resolved. No cause based on ENT exam and CT chest - possibly was bronchitis  - wil monitor clinically - if recurs do broncoscopy  Chronic cough Mild persistent asthma, uncomplicated  - probably due to asthma - glad breo helped - continue this daily  Pulmonary hypertension ([place]) - seen on echo 2015 and CT chest 2017  - refer Dr [name] of cards  - this problem can contribute to shortness of breath  #Followup - 6 months or sooner if needed

## 2015-09-30 NOTE — Progress Notes (Signed)
Subjective:     Patient ID: [name], female   DOB: [birth year], 82 y.o.   MRN: [id number]  HPI  PCP Walker Kehr, MD   HPI  Brief patient profile:  85 yobf never smoker with h/o sneezing/itching/coughing  eval 1979 and placed on allergy shots ever since but nose runs year round  if doesn't  take zyrtec developed new unexplained doe 2014 self referred to pulmonary clinic 06/20/2013 with nl  pfts 07/02/13     History of Present Illness  06/20/2013 1st [facility] office visit/ Wert cc progressive doe x one year (chart suggests dates back to 2012 however) indolent onset to point where can't do steps at [place], can't finish silver sneakers, can't walk a mile, can do grocery shopping but has to rest before going across the parking lot.  Hb during this time worse than usual on aciphex ac and pc rec Stop visken and start bystolic 5 mg one twice daily  Continue aciphex 20 mg Take 30- 60 min before your first and last meals of the day    07/02/2013 f/u ov/Wert re: unexplained sob Chief Complaint  Patient presents with  . Follow-up    Pt reports her breathing is unchanged since her last visit. She c/o HA since she started taking bystolic.   ha is random, transient, not related to time of day or activity, generalized, s nausea of neuro/viz cc's rec Finish your cardiac evaluation first, then if not doing better call [id number] for CPST with spirometry before and after > neg myoview, was not physically stressed at any point Zebeta 5 mg reduce to  one half tablet daily  Take only the medications on your list as they are on your after visit summary  If heart burn acts up, ok to resume aciphex 20 mg  > it did flare so she continue aciphe bid    08/07/2013 f/u ov/Wert re: unexplained doe, takes various alternative rx/ "nothing worksAdvertising account planner Complaint  Patient presents with  . Follow-up    Breathing is unchanged since last visit. She states that Cardiology ruled out any heart problems and  advised her to f/u here.    no better or worse doe since onset, never occurs at rest or sleeping, still can't do a grocery store s stopping to rest  rec Continue on current regimen   Follow med calendar closely and bring to each visit.   08/23/2013 followup and medication review. Patient returns for a followup visit and medication review. Reviewed all her medications and organized them into a medication calendar. Patient education. Appears the patient is taking her medication currently. Says her dyspnea is unchanged since last ov; no new complaints. rec Continue on current regimen  Labs today.  Follow med calendar closely and bring to each visit   09/20/2013 f/u ov/Wert re: sob x much worse since  first of the year/ no med calendar Chief Complaint  Patient presents with  . Shortness of Breath    Breathing is unchanged. Still reports DOE. Denies chest tightness or coughing.  no resting or noct symptoms - doe not really worse since onset   No obvious day to day or daytime variabilty or assoc chronic cough or cp or chest tightness, subjective wheeze overt sinus or hb symptoms. No unusual exp hx or h/o childhood pna/ asthma or knowledge of premature birth.  Sleeping ok without nocturnal  or early am exacerbation  of respiratory  c/o's or need for noct saba. Also denies any obvious  fluctuation of symptoms with weather or environmental changes or other aggravating or alleviating factors except as outlined above OV 09/04/2015      Chief Complaint  Patient presents with  . Pulmonary Consult    Pt referred by Dr. Harold Hedge. Pt c/o blood tinged sputum when waking in morning x 1 year. Pt c/o intermittent DOE. Pt denies CP/tightness.        Referred by Dr. Fredderick Phenix for hemoptysis. Patient herself is a poor historian. She was last seen by Dr. Melvyn Novas in our office in May 2015 and discharged from follow-up. As best as I can gather in reviewing outside chart, old chart and talking to the  patient she seems to have a chronic cough for the last 1 year. She says it is mild but on RSI cough score below it is obviously a lot more severe in symptoms with a score of 29. She says early in the morning when she gets up she notices a mild amount of hemoptysis. Although when I questioned her if she is coughing it up she says it comes from her throat. Tablet every other day. This been no weight loss since then. This no shortness of breath or wheezing. She follows at the [facility] for 1 or 2 decades. She's been on allergy shots to a few years ago. And then that was stopped. She is on follow-up with antihistamines but review of the records. She denies any shortness of breath. She is a nonsmoker. She does complain of scratchy throat and postnasal drip. She's never seen a ENT physician. She seems to think all her cough is due to acid reflux  no matter what I tried to explain   Exhaled nitric oxide in our office today 09/04/2015 - slightly elevated at 41 ppb; denies having been on any asthma inhalers for many years  She is unsure if she wants to do testing for hemoptysis ".doc do what you  need to do"  Prior imaging reviewed and visualized 04/16/2011 she had CT angiogram chest that ruled out pulmonary embolism. Pulmonary parenchyma was clear of infiltrates most recently she had chest x-ray 02/13/2015 that is clear of any infiltrates. She had full pulmonary function test March 2015 that I personally visualized and looks normal except for isolated low diffusion capacity of 61%. She had echocardiogram February 2015 that shows mild diastolic dysfunction she's not on any anticoagulants. According   OV 09/30/2015  Chief Complaint  Patient presents with  . Follow-up    pt states her SOB has improved, no more hemoptysis but does note a prod cough with yellow/white mucus.  Denies CP, fever, chest tightness.     Follow-up hemoptysis - is as never recurred after the first time. CT chest is clear CT  sinuses clear. She prefers not to have bronchoscopy/  reports that she has never smoked. She has never used smokeless tobacco.   Follow-up chronic cough presumably due to asthma based on exhaled nitric oxide testing last visit: Now on Brio and she is significantly better. Off and sputum production are all improved. She is only taking Brio 3 times a week because she forgets.  Other evaluation: She has dyspnea. CT chest showed pulmonary hypertension. Is also significant echocardiogram 2015. I made a referral to Dr. Aundra Dubin but she is able to see him I do not see any appointment made for him.    Dr Lorenza Cambridge Reflux Symptom Index (> 13-15 suggestive of LPR cough) 0 -> 5  =  none ->severe problem  09/04/2015   Hoarseness of problem with voice 1  Clearing  Of Throat 2  Excess throat mucus or feeling of post nasal drip 5  Difficulty swallowing food, liquid or tablets 0  Cough after eating or lying down 5  Breathing difficulties or choking episodes 4  Troublesome or annoying cough 3  Sensation of something sticking in throat or lump in throat 4  Heartburn, chest pain, indigestion, or stomach acid coming up 5  TOTAL 29      has a past medical history of Thyroid disease; Glaucoma; IBS (irritable bowel syndrome); Incontinence; GERD (gastroesophageal reflux disease); Hypertension; Anxiety; Depression; breast cancer; Osteoarthritis; Osteopenia; Bradycardia; IBS (irritable bowel syndrome); Discoid lupus; Pacemaker; Full dentures; Wears hearing aid; echocardiogram; exercise stress test; and Pacemaker.   reports that she has never smoked. She has never used smokeless tobacco.  Past Surgical History  Procedure Laterality Date  . Pacemaker placement  2009  . Foot surgery  2004    Left-hammer toes  . Mastectomy  2002    bilat mastectomy-snbx  . Breast lumpectomy  1995    lt -axillary node dissectoin  . Dorsal compartment release  2000    left  . Abdominal hysterectomy    . Eye surgery  2011     cataract-lt  . Knee arthroscopy Left 08/29/2012    Procedure: LEFT KNEE ARTHROSCOPY ;  Surgeon: Hessie Dibble, MD;  Location: [facility];  Service: Orthopedics;  Laterality: Left;  . Trigger finger release Right 07/31/2013    Procedure: EXCISE MASS RIGHT INDEX A-1 PLLLEY RELEASE A-1 RIGHT INDEX;  Surgeon: Cammie Sickle., MD;  Location: [facility];  Service: Orthopedics;  Laterality: Right;    Allergies  Allergen Reactions  . Lactase Diarrhea  . Amlodipine Besy-Benazepril Hcl Swelling  . Amlodipine Besylate Swelling  . Clonidine Hydrochloride     REACTION: tired  . Lactose Intolerance (Gi) Other (See Comments)  . Valsartan Other (See Comments)    Tongue swelling   . Verapamil     There is no immunization history for the selected administration types on file for this patient.  Family History  Problem Relation Age of Onset  . Ovarian cancer Mother   . Heart disease Father   . Stroke Sister   . Stroke Brother   . Cancer Brother   . Heart disease Brother      Current outpatient prescriptions:  .  albuterol (PROVENTIL HFA;VENTOLIN HFA) 108 (90 Base) MCG/ACT inhaler, Inhale 2 puffs into the lungs every 6 (six) hours as needed for wheezing or shortness of breath., Disp: 1 Inhaler, Rfl: 6 .  allopurinol (ZYLOPRIM) 300 MG tablet, Take 1 tablet (300 mg total) by mouth daily., Disp: 30 tablet, Rfl: 4 .  Cetirizine HCl (ZYRTEC ALLERGY) 10 MG CAPS, Take 1 capsule by mouth daily as needed. , Disp: , Rfl:  .  Cholecalciferol (VITAMIN D3) 2000 UNITS TABS, Take 1 tablet by mouth as needed. , Disp: , Rfl:  .  escitalopram (LEXAPRO) 10 MG tablet, Take 1 tablet (10 mg total) by mouth daily., Disp: 90 tablet, Rfl: 0 .  fluticasone furoate-vilanterol (BREO ELLIPTA) 200-25 MCG/INH AEPB, Inhale 1 puff into the lungs daily., Disp: 60 each, Rfl: 5 .  levothyroxine (SYNTHROID, LEVOTHROID) 100 MCG tablet, Take 1 tablet (100 mcg total) by mouth daily., Disp: 90 tablet,  Rfl: 3 .  LORazepam (ATIVAN) 0.5 MG tablet, TAKE ONE TABLET BY MOUTH AS NEEDED FOR ANXIETY, Disp: 30 tablet, Rfl: 1 .  magnesium oxide (MAG-OX) 400 MG tablet, Take 400 mg by mouth daily., Disp: , Rfl:  .  pindolol (VISKEN) 5 MG tablet, Take 1 tablet (5 mg total) by mouth daily., Disp: 90 tablet, Rfl: 1 .  Psyllium (METAMUCIL PO), Once daily, Disp: , Rfl:  .  ranitidine (ZANTAC) 150 MG capsule, Take 150 mg by mouth 3 (three) times daily., Disp: , Rfl:  .  spironolactone (ALDACTONE) 25 MG tablet, Take 1 tablet (25 mg total) by mouth daily., Disp: 30 tablet, Rfl: 11     Review of Systems     Objective:   Physical Exam  Filed Vitals:   09/30/15 1022  BP: 134/78  Pulse: 71  Height: 5\' 3"  (1.6 m)  Weight: 171 lb 6.4 oz (77.747 kg)  SpO2: 100%   Mainly discussion visit Alert and oriented 3. Uses a cane. Clear to auscultation bilaterally. Normal heart sounds. Abdomen is soft no edema     Assessment:       ICD-9-CM ICD-10-CM   1. Hemoptysis 786.30 R04.2 Ambulatory referral to Cardiology  2. Chronic cough 786.2 R05 Ambulatory referral to Cardiology  3. Mild persistent asthma, uncomplicated 123456 [id number] Ambulatory referral to Cardiology  4. Pulmonary hypertension ([place]) 416.8 I27.2 Ambulatory referral to Cardiology       Plan:      Hemoptysis - one time and resolved. No cause based on ENT exam and CT chest - possibly was bronchitis  - wil monitor clinically - if recurs do broncoscopy  Chronic cough Mild persistent asthma, uncomplicated  - probably due to asthma - glad breo helped - continue this daily  Pulmonary hypertension ([place]) - seen on echo 2015 and CT chest 2017  - refer Dr [name] of cards  - this problem can contribute to shortness of breath  #Followup - 6 months or sooner if needed   Dr. Brand Males, M.D., [facility].C.P Pulmonary and Critical Care Medicine Staff Physician [facility] Pager: [phone], If no answer or between  15:00h - 7:00h: call [phone]  09/30/2015 10:46 AM

## 2015-10-05 ENCOUNTER — Encounter: Payer: Self-pay | Admitting: Internal Medicine

## 2015-10-05 ENCOUNTER — Other Ambulatory Visit: Payer: Self-pay | Admitting: Internal Medicine

## 2015-10-06 ENCOUNTER — Telehealth: Payer: Self-pay | Admitting: Internal Medicine

## 2015-10-06 NOTE — Telephone Encounter (Signed)
[facility]  Patient Name: [name]  DOB: 12-Jan-1930    Initial Comment Caller states, pain in lower jaw for 3-4 days, which also goes down her neck.    Nurse Assessment  Nurse: Wynetta Emery, RN, Baker Janus Date/Time Eilene Ghazi Time): 10/06/2015 9:31:32 AM  Confirm and document reason for call. If symptomatic, describe symptoms. You must click the next button to save text entered. ---Don Perking started with jaw pain that radiates in to shoulder onset 3 to 4 days ago  Has the patient traveled out of the country within the last 30 days? ---No  Does the patient have any new or worsening symptoms? ---Yes  Will a triage be completed? ---Yes  Related visit to physician within the last 2 weeks? ---No  Does the PT have any chronic conditions? (i.e. diabetes, asthma, etc.) ---No  Is this a behavioral health or substance abuse call? ---No     Guidelines    Guideline Title Affirmed Question Affirmed Notes  Neck Pain or Stiffness [1] SEVERE neck pain (e.g., excruciating, unable to do any normal activities) AND [2] not improved after 2 hours of pain medicine    Final Disposition User   See Physician within 4 Hours (or PCP triage) Wynetta Emery, RN, Baker Janus    Comments  NOTE No appt today with Dr. Alain Marion --denies any chest pain pressure, tightness heaviness of chest just pain started in jaw neck and now in shoulder like she laid wrong pain is not as bad as Saturday night 10-07-2015 Dr. Alain Marion @ 1115am for said pain   Referrals  REFERRED TO PCP OFFICE   Disagree/Comply: Comply

## 2015-10-07 ENCOUNTER — Ambulatory Visit: Payer: Self-pay | Admitting: Internal Medicine

## 2015-10-10 ENCOUNTER — Other Ambulatory Visit (INDEPENDENT_AMBULATORY_CARE_PROVIDER_SITE_OTHER): Payer: Medicare Other

## 2015-10-10 ENCOUNTER — Encounter: Payer: Self-pay | Admitting: Internal Medicine

## 2015-10-10 ENCOUNTER — Ambulatory Visit (INDEPENDENT_AMBULATORY_CARE_PROVIDER_SITE_OTHER): Payer: Medicare Other | Admitting: Internal Medicine

## 2015-10-10 VITALS — BP 124/80 | HR 78 | Temp 98.7°F | Wt 173.0 lb

## 2015-10-10 DIAGNOSIS — R51 Headache: Secondary | ICD-10-CM | POA: Diagnosis not present

## 2015-10-10 DIAGNOSIS — L930 Discoid lupus erythematosus: Secondary | ICD-10-CM | POA: Insufficient documentation

## 2015-10-10 DIAGNOSIS — R519 Headache, unspecified: Secondary | ICD-10-CM

## 2015-10-10 DIAGNOSIS — M2550 Pain in unspecified joint: Secondary | ICD-10-CM | POA: Insufficient documentation

## 2015-10-10 LAB — URINALYSIS
Bilirubin Urine: NEGATIVE
Ketones, ur: NEGATIVE
Leukocytes, UA: NEGATIVE
Nitrite: NEGATIVE
TOTAL PROTEIN, URINE-UPE24: NEGATIVE
URINE GLUCOSE: NEGATIVE
UROBILINOGEN UA: 0.2 (ref 0.0–1.0)
pH: 6.5 (ref 5.0–8.0)

## 2015-10-10 LAB — CBC WITH DIFFERENTIAL/PLATELET
BASOS PCT: 0.8 % (ref 0.0–3.0)
Basophils Absolute: 0 10*3/uL (ref 0.0–0.1)
Eosinophils Absolute: 0.1 10*3/uL (ref 0.0–0.7)
Eosinophils Relative: 2.5 % (ref 0.0–5.0)
HEMATOCRIT: 38.6 % (ref 36.0–46.0)
Hemoglobin: 13.1 g/dL (ref 12.0–15.0)
LYMPHS PCT: 25 % (ref 12.0–46.0)
Lymphs Abs: 1.2 10*3/uL (ref 0.7–4.0)
MCHC: 33.8 g/dL (ref 30.0–36.0)
MCV: 96.5 fl (ref 78.0–100.0)
MONOS PCT: 7.8 % (ref 3.0–12.0)
Monocytes Absolute: 0.4 10*3/uL (ref 0.1–1.0)
NEUTROS ABS: 3 10*3/uL (ref 1.4–7.7)
Neutrophils Relative %: 63.9 % (ref 43.0–77.0)
PLATELETS: 199 10*3/uL (ref 150.0–400.0)
RBC: 4 Mil/uL (ref 3.87–5.11)
RDW: 14.8 % (ref 11.5–15.5)
WBC: 4.8 10*3/uL (ref 4.0–10.5)

## 2015-10-10 LAB — CK: Total CK: 106 U/L (ref 7–177)

## 2015-10-10 LAB — BASIC METABOLIC PANEL
BUN: 23 mg/dL (ref 6–23)
CO2: 28 mEq/L (ref 19–32)
CREATININE: 1.01 mg/dL (ref 0.40–1.20)
Calcium: 9 mg/dL (ref 8.4–10.5)
Chloride: 102 mEq/L (ref 96–112)
GFR: 67.21 mL/min (ref >60.00)
Glucose, Bld: 86 mg/dL (ref 70–99)
Potassium: 3.9 mEq/L (ref 3.5–5.1)
Sodium: 137 mEq/L (ref 135–145)

## 2015-10-10 LAB — URIC ACID: URIC ACID, SERUM: 4 mg/dL (ref 2.4–7.0)

## 2015-10-10 LAB — SEDIMENTATION RATE: Sed Rate: 31 mm/hr — ABNORMAL HIGH (ref 0–30)

## 2015-10-10 LAB — RHEUMATOID FACTOR

## 2015-10-10 NOTE — Assessment & Plan Note (Addendum)
New Labs.  R/o PMR/temp arteritis - info given Pt refused empiric steroids

## 2015-10-10 NOTE — Progress Notes (Signed)
Pre visit review using our clinic review tool, if applicable. No additional management support is needed unless otherwise documented below in the visit note. 

## 2015-10-10 NOTE — Progress Notes (Signed)
Subjective:  Patient ID: [name], female    DOB: 1930-01-07  Age: 82 y.o. MRN: [id number]  CC: Neck Pain; Headache; and Back Pain   HPI [name] presents for feeling bad, achy x 2 wks. C/o joint aches - neck and shoulder, HA.  Pt is getting shots in the knees.  C/o unsteady gait C/o titanium implants are in the mouth for dentures   Outpatient Prescriptions Prior to Visit  Medication Sig Dispense Refill  . albuterol (PROVENTIL HFA;VENTOLIN HFA) 108 (90 Base) MCG/ACT inhaler Inhale 2 puffs into the lungs every 6 (six) hours as needed for wheezing or shortness of breath. 1 Inhaler 6  . allopurinol (ZYLOPRIM) 300 MG tablet Take 1 tablet (300 mg total) by mouth daily. 30 tablet 4  . Cetirizine HCl (ZYRTEC ALLERGY) 10 MG CAPS Take 1 capsule by mouth daily as needed.     . Cholecalciferol (VITAMIN D3) 2000 UNITS TABS Take 1 tablet by mouth as needed.     [name] escitalopram (LEXAPRO) 10 MG tablet Take 1 tablet (10 mg total) by mouth daily. 90 tablet 0  . fluticasone furoate-vilanterol (BREO ELLIPTA) 200-25 MCG/INH AEPB Inhale 1 puff into the lungs daily. 60 each 5  . levothyroxine (SYNTHROID, LEVOTHROID) 100 MCG tablet Take 1 tablet (100 mcg total) by mouth daily. 90 tablet 3  . LORazepam (ATIVAN) 0.5 MG tablet TAKE ONE TABLET BY MOUTH AS NEEDED FOR ANXIETY 30 tablet 1  . magnesium oxide (MAG-OX) 400 MG tablet Take 400 mg by mouth daily.    [name] oxybutynin (DITROPAN) 5 MG tablet TAKE 1 TABLET BY MOUTH 3 TIMES A DAY 90 tablet 0  . pindolol (VISKEN) 5 MG tablet Take 1 tablet (5 mg total) by mouth daily. 90 tablet 1  . Psyllium (METAMUCIL PO) Once daily    . ranitidine (ZANTAC) 150 MG capsule Take 150 mg by mouth 3 (three) times daily.    [name] spironolactone (ALDACTONE) 25 MG tablet Take 1 tablet (25 mg total) by mouth daily. 30 tablet 11   No facility-administered medications prior to visit.    ROS Review of Systems  Constitutional: Positive for fatigue. Negative for chills, activity change,  appetite change and unexpected weight change.  HENT: Negative for congestion, ear pain, mouth sores and sinus pressure.   Eyes: Negative for photophobia, pain, discharge and visual disturbance.  Respiratory: Negative for cough and chest tightness.   Cardiovascular: Positive for leg swelling. Negative for chest pain.  Gastrointestinal: Negative for nausea and abdominal pain.  Genitourinary: Negative for frequency, difficulty urinating and vaginal pain.  Musculoskeletal: Positive for back pain, arthralgias, gait problem and neck pain.  Skin: Negative for pallor and rash.  Neurological: Positive for headaches. Negative for dizziness, tremors, weakness, light-headedness and numbness.  Psychiatric/Behavioral: Negative for confusion and sleep disturbance.    Objective:  BP 124/80 mmHg  Pulse 78  Temp(Src) 98.7 F (37.1 C) (Oral)  Wt 173 lb (78.472 kg)  SpO2 97%  BP Readings from Last 3 Encounters:  10/10/15 124/80  09/30/15 134/78  09/04/15 140/82    Wt Readings from Last 3 Encounters:  10/10/15 173 lb (78.472 kg)  09/30/15 171 lb 6.4 oz (77.747 kg)  09/04/15 173 lb (78.472 kg)    Physical Exam  Constitutional: She appears well-developed. No distress.  HENT:  Head: Normocephalic.  Right Ear: External ear normal.  Left Ear: External ear normal.  Nose: Nose normal.  Mouth/Throat: Oropharynx is clear and moist.  Eyes: Conjunctivae are normal. Pupils are equal, round,  and reactive to light. Right eye exhibits no discharge. Left eye exhibits no discharge.  Neck: Normal range of motion. Neck supple. No JVD present. No tracheal deviation present. No thyromegaly present.  Cardiovascular: Normal rate, regular rhythm and normal heart sounds.   Pulmonary/Chest: No stridor. No respiratory distress. She has no wheezes.  Abdominal: Soft. Bowel sounds are normal. She exhibits no distension and no mass. There is no tenderness. There is no rebound and no guarding.  Musculoskeletal: She  exhibits edema and tenderness.  Lymphadenopathy:    She has no cervical adenopathy.  Neurological: She displays normal reflexes. No cranial nerve deficit. She exhibits normal muscle tone. Coordination normal.  Skin: No rash noted. No erythema.  Psychiatric: She has a normal mood and affect. Her behavior is normal. Judgment and thought content normal.  No pulsating vessels on the temples Shoulders are tender  Lab Results  Component Value Date   WBC 4.0 10/22/2014   HGB 13.5 10/22/2014   HCT 40.6 10/22/2014   PLT 212.0 10/22/2014   GLUCOSE 86 07/21/2015   CHOL 174 05/11/2010   TRIG 78.0 05/11/2010   HDL 52.30 05/11/2010   LDLDIRECT 130.8 09/30/2006   LDLCALC 106* 05/11/2010   ALT 9 07/21/2015   AST 14 07/21/2015   NA 138 07/21/2015   K 4.1 07/21/2015   CL 102 07/21/2015   CREATININE 0.98 07/21/2015   BUN 23 07/21/2015   CO2 29 07/21/2015   TSH 1.36 07/21/2015   INR 1.0 10/22/2014   HGBA1C 6.0 04/10/2012    Ct Chest High Resolution  09/10/2015  CLINICAL DATA:  Shortness of breath on exertion. Blood tinged sputum in the morning. EXAM: CT CHEST WITHOUT CONTRAST TECHNIQUE: Multidetector CT imaging of the chest was performed following the standard protocol without intravenous contrast. High resolution imaging of the lungs, as well as inspiratory and expiratory imaging, was performed. COMPARISON:  04/16/2011. FINDINGS: Mediastinum/Lymph Nodes: No pathologically enlarged mediastinal or axillary lymph nodes. There are surgical clips in the left axilla. Hilar regions are difficult to definitively evaluate without IV contrast. Ascending aorta measures approximately 4.0 cm. Pulmonary arteries and heart are enlarged. No pericardial effusion. Small hiatal hernia. Lungs/Pleura: Minimal scattered scarring and volume loss at the lung bases. No subpleural reticulation, traction bronchiectasis/ bronchiolectasis, ground-glass or architectural distortion. A few scattered pulmonary nodules measure up to  3 mm in the medial right lower lobe, stable. No pleural fluid. Airway is unremarkable. No air trapping. Upper abdomen: Visualized portions of the liver, gallbladder, adrenal glands, left kidney, spleen, pancreas, stomach and bowel are grossly unremarkable with exception of a small hiatal hernia. Upper abdominal lymph nodes are not enlarged by CT size criteria. Musculoskeletal: No worrisome lytic or sclerotic lesions. IMPRESSION: 1. No evidence of interstitial lung disease. 2. Ascending aortic aneurysm. Recommend annual imaging followup by CTA or MRA. This recommendation follows 2010 ACCF/AHA/AATS/ACR/ASA/SCA/SCAI/SIR/STS/SVM Guidelines for the Diagnosis and Management of Patients with Thoracic Aortic Disease. Circulation. 2010; 121: [id number]. 3. Enlarged pulmonary arteries, indicative of pulmonary arterial hypertension. Electronically Signed   By: Lorin Picket M.D.   On: 09/10/2015 16:26   [place] Cm  09/10/2015  CLINICAL DATA:  Intermittent cough with blood-tinged sputum. Itchy throat with dyspnea on exertion. Evaluate for sinusitis. EXAM: CT PARANASAL SINUS LIMITED WITHOUT CONTRAST TECHNIQUE: Non-contiguous multidetector CT images of the paranasal sinuses were obtained in a single plane without contrast. COMPARISON:  Head CT 05/28/2012. FINDINGS: Non contiguous axial images were obtained through the paranasal sinuses with the patient supine. The  sinuses are clear without mucosal thickening or air-fluid levels. The visualized mastoid air cells and middle ears are clear. The visualized orbital and intracranial contents are unremarkable. IMPRESSION: No evidence of active sinus disease. Electronically Signed   By: Richardean Sale M.D.   On: 09/10/2015 17:13    Assessment & Plan:   There are no diagnoses linked to this encounter. I am having Ms. Claes maintain her Cetirizine HCl, Psyllium (METAMUCIL PO), magnesium oxide, Vitamin D3, allopurinol, pindolol, escitalopram, LORazepam,  levothyroxine, albuterol, fluticasone furoate-vilanterol, spironolactone, ranitidine, oxybutynin, fluocinonide cream, and chlorhexidine.  Meds ordered this encounter  Medications  . fluocinonide cream (LIDEX) 0.05 %    Sig:   . chlorhexidine (PERIDEX) 0.12 % solution    Sig:      Follow-up: No Follow-up on file.  Walker Kehr, MD

## 2015-10-10 NOTE — Assessment & Plan Note (Addendum)
6/17 Worse Labs.  R/o PMR - info given Pt refused empiric steroids

## 2015-10-10 NOTE — Assessment & Plan Note (Signed)
Dr Ubaldo Glassing Fluticasone cream prn

## 2015-10-10 NOTE — Assessment & Plan Note (Signed)
Worse Labs.  R/o PMR

## 2015-10-13 ENCOUNTER — Ambulatory Visit (INDEPENDENT_AMBULATORY_CARE_PROVIDER_SITE_OTHER): Payer: Medicare Other | Admitting: Psychology

## 2015-10-13 DIAGNOSIS — F332 Major depressive disorder, recurrent severe without psychotic features: Secondary | ICD-10-CM

## 2015-10-13 LAB — ANA: Anti Nuclear Antibody(ANA): NEGATIVE

## 2015-10-21 ENCOUNTER — Ambulatory Visit (INDEPENDENT_AMBULATORY_CARE_PROVIDER_SITE_OTHER): Payer: Medicare Other | Admitting: Internal Medicine

## 2015-10-21 ENCOUNTER — Encounter: Payer: Self-pay | Admitting: Internal Medicine

## 2015-10-21 VITALS — BP 120/80 | HR 92 | Wt 169.0 lb

## 2015-10-21 DIAGNOSIS — M2550 Pain in unspecified joint: Secondary | ICD-10-CM | POA: Diagnosis not present

## 2015-10-21 DIAGNOSIS — I119 Hypertensive heart disease without heart failure: Secondary | ICD-10-CM

## 2015-10-21 DIAGNOSIS — F411 Generalized anxiety disorder: Secondary | ICD-10-CM | POA: Diagnosis not present

## 2015-10-21 DIAGNOSIS — L930 Discoid lupus erythematosus: Secondary | ICD-10-CM

## 2015-10-21 DIAGNOSIS — N3289 Other specified disorders of bladder: Secondary | ICD-10-CM | POA: Diagnosis not present

## 2015-10-21 NOTE — Assessment & Plan Note (Signed)
Fluticasone cream prn

## 2015-10-21 NOTE — Progress Notes (Signed)
Subjective:  Patient ID: [name], female    DOB: 1929-07-04  Age: 82 y.o. MRN: [id number]  CC: No chief complaint on file.   HPI Edwena Ghan presents for arthralgias and fatigue -- better. F/u asthma. F/u IBS, OAB.  Outpatient Prescriptions Prior to Visit  Medication Sig Dispense Refill  . albuterol (PROVENTIL HFA;VENTOLIN HFA) 108 (90 Base) MCG/ACT inhaler Inhale 2 puffs into the lungs every 6 (six) hours as needed for wheezing or shortness of breath. 1 Inhaler 6  . allopurinol (ZYLOPRIM) 300 MG tablet Take 1 tablet (300 mg total) by mouth daily. 30 tablet 4  . Cetirizine HCl (ZYRTEC ALLERGY) 10 MG CAPS Take 1 capsule by mouth daily as needed.     . chlorhexidine (PERIDEX) 0.12 % solution     . escitalopram (LEXAPRO) 10 MG tablet Take 1 tablet (10 mg total) by mouth daily. 90 tablet 0  . fluocinonide cream (LIDEX) 0.05 %     . fluticasone furoate-vilanterol (BREO ELLIPTA) 200-25 MCG/INH AEPB Inhale 1 puff into the lungs daily. 60 each 5  . levothyroxine (SYNTHROID, LEVOTHROID) 100 MCG tablet Take 1 tablet (100 mcg total) by mouth daily. 90 tablet 3  . LORazepam (ATIVAN) 0.5 MG tablet TAKE ONE TABLET BY MOUTH AS NEEDED FOR ANXIETY 30 tablet 1  . oxybutynin (DITROPAN) 5 MG tablet TAKE 1 TABLET BY MOUTH 3 TIMES A DAY 90 tablet 0  . pindolol (VISKEN) 5 MG tablet Take 1 tablet (5 mg total) by mouth daily. 90 tablet 1  . Psyllium (METAMUCIL PO) Once daily    . ranitidine (ZANTAC) 150 MG capsule Take 150 mg by mouth 3 (three) times daily.    [name] spironolactone (ALDACTONE) 25 MG tablet Take 1 tablet (25 mg total) by mouth daily. 30 tablet 11  . Cholecalciferol (VITAMIN D3) 2000 UNITS TABS Take 1 tablet by mouth as needed. Reported on 10/21/2015    . magnesium oxide (MAG-OX) 400 MG tablet Take 400 mg by mouth daily. Reported on 10/21/2015     No facility-administered medications prior to visit.    ROS Review of Systems  Constitutional: Positive for fatigue. Negative for chills, activity  change, appetite change and unexpected weight change.  HENT: Negative for congestion, mouth sores and sinus pressure.   Eyes: Negative for visual disturbance.  Respiratory: Negative for cough and chest tightness.   Gastrointestinal: Negative for nausea and abdominal pain.  Genitourinary: Negative for frequency, difficulty urinating and vaginal pain.  Musculoskeletal: Positive for arthralgias. Negative for back pain and gait problem.  Skin: Negative for pallor and rash.  Neurological: Negative for dizziness, tremors, weakness, numbness and headaches.  Psychiatric/Behavioral: Negative for confusion and sleep disturbance.    Objective:  BP 120/80 mmHg  Pulse 92  Wt 169 lb (76.658 kg)  SpO2 97%  BP Readings from Last 3 Encounters:  10/21/15 120/80  10/10/15 124/80  09/30/15 134/78    Wt Readings from Last 3 Encounters:  10/21/15 169 lb (76.658 kg)  10/10/15 173 lb (78.472 kg)  09/30/15 171 lb 6.4 oz (77.747 kg)    Physical Exam  Constitutional: She appears well-developed. No distress.  HENT:  Head: Normocephalic.  Right Ear: External ear normal.  Left Ear: External ear normal.  Nose: Nose normal.  Mouth/Throat: Oropharynx is clear and moist.  Eyes: Conjunctivae are normal. Pupils are equal, round, and reactive to light. Right eye exhibits no discharge. Left eye exhibits no discharge.  Neck: Normal range of motion. Neck supple. No JVD present. No tracheal deviation  present. No thyromegaly present.  Cardiovascular: Normal rate, regular rhythm and normal heart sounds.   Pulmonary/Chest: No stridor. No respiratory distress. She has no wheezes.  Abdominal: Soft. Bowel sounds are normal. She exhibits no distension and no mass. There is no tenderness. There is no rebound and no guarding.  Musculoskeletal: She exhibits no edema or tenderness.  Lymphadenopathy:    She has no cervical adenopathy.  Neurological: She displays normal reflexes. No cranial nerve deficit. She exhibits  normal muscle tone. Coordination abnormal.  Skin: No rash noted. No erythema.  Psychiatric: She has a normal mood and affect. Her behavior is normal. Judgment and thought content normal.  a/o/c  Lab Results  Component Value Date   WBC 4.8 10/10/2015   HGB 13.1 10/10/2015   HCT 38.6 10/10/2015   PLT 199.0 10/10/2015   GLUCOSE 86 10/10/2015   CHOL 174 05/11/2010   TRIG 78.0 05/11/2010   HDL 52.30 05/11/2010   LDLDIRECT 130.8 09/30/2006   LDLCALC 106* 05/11/2010   ALT 9 07/21/2015   AST 14 07/21/2015   NA 137 10/10/2015   K 3.9 10/10/2015   CL 102 10/10/2015   CREATININE 1.01 10/10/2015   BUN 23 10/10/2015   CO2 28 10/10/2015   TSH 1.36 07/21/2015   INR 1.0 10/22/2014   HGBA1C 6.0 04/10/2012    Ct Chest High Resolution  09/10/2015  CLINICAL DATA:  Shortness of breath on exertion. Blood tinged sputum in the morning. EXAM: CT CHEST WITHOUT CONTRAST TECHNIQUE: Multidetector CT imaging of the chest was performed following the standard protocol without intravenous contrast. High resolution imaging of the lungs, as well as inspiratory and expiratory imaging, was performed. COMPARISON:  04/16/2011. FINDINGS: Mediastinum/Lymph Nodes: No pathologically enlarged mediastinal or axillary lymph nodes. There are surgical clips in the left axilla. Hilar regions are difficult to definitively evaluate without IV contrast. Ascending aorta measures approximately 4.0 cm. Pulmonary arteries and heart are enlarged. No pericardial effusion. Small hiatal hernia. Lungs/Pleura: Minimal scattered scarring and volume loss at the lung bases. No subpleural reticulation, traction bronchiectasis/ bronchiolectasis, ground-glass or architectural distortion. A few scattered pulmonary nodules measure up to 3 mm in the medial right lower lobe, stable. No pleural fluid. Airway is unremarkable. No air trapping. Upper abdomen: Visualized portions of the liver, gallbladder, adrenal glands, left kidney, spleen, pancreas, stomach  and bowel are grossly unremarkable with exception of a small hiatal hernia. Upper abdominal lymph nodes are not enlarged by CT size criteria. Musculoskeletal: No worrisome lytic or sclerotic lesions. IMPRESSION: 1. No evidence of interstitial lung disease. 2. Ascending aortic aneurysm. Recommend annual imaging followup by CTA or MRA. This recommendation follows 2010 ACCF/AHA/AATS/ACR/ASA/SCA/SCAI/SIR/STS/SVM Guidelines for the Diagnosis and Management of Patients with Thoracic Aortic Disease. Circulation. 2010; 121: [id number]. 3. Enlarged pulmonary arteries, indicative of pulmonary arterial hypertension. Electronically Signed   By: Lorin Picket M.D.   On: 09/10/2015 16:26   [place] Cm  09/10/2015  CLINICAL DATA:  Intermittent cough with blood-tinged sputum. Itchy throat with dyspnea on exertion. Evaluate for sinusitis. EXAM: CT PARANASAL SINUS LIMITED WITHOUT CONTRAST TECHNIQUE: Non-contiguous multidetector CT images of the paranasal sinuses were obtained in a single plane without contrast. COMPARISON:  Head CT 05/28/2012. FINDINGS: Non contiguous axial images were obtained through the paranasal sinuses with the patient supine. The sinuses are clear without mucosal thickening or air-fluid levels. The visualized mastoid air cells and middle ears are clear. The visualized orbital and intracranial contents are unremarkable. IMPRESSION: No evidence of active sinus disease. Electronically  Signed   By: Richardean Sale M.D.   On: 09/10/2015 17:13    Assessment & Plan:   There are no diagnoses linked to this encounter. I am having Ms. Waterford maintain her Cetirizine HCl, Psyllium (METAMUCIL PO), magnesium oxide, Vitamin D3, allopurinol, pindolol, escitalopram, LORazepam, levothyroxine, albuterol, fluticasone furoate-vilanterol, spironolactone, ranitidine, oxybutynin, fluocinonide cream, and chlorhexidine.  No orders of the defined types were placed in this encounter.     Follow-up: No  Follow-up on file.  Walker Kehr, MD

## 2015-10-21 NOTE — Assessment & Plan Note (Signed)
Pindolol and Spironolactone

## 2015-10-21 NOTE — Assessment & Plan Note (Signed)
Feeling better  Labs reviewed w/pt

## 2015-10-21 NOTE — Assessment & Plan Note (Signed)
Lexapro Lorazepam  Potential benefits of a long term benzodiazepines  use as well as potential risks  and complications were explained to the patient and were aknowledged.

## 2015-10-21 NOTE — Progress Notes (Signed)
Pre visit review using our clinic review tool, if applicable. No additional management support is needed unless otherwise documented below in the visit note. 

## 2015-10-21 NOTE — Assessment & Plan Note (Signed)
Doing fair 

## 2015-10-22 ENCOUNTER — Telehealth: Payer: Self-pay | Admitting: Cardiology

## 2015-10-22 ENCOUNTER — Ambulatory Visit (INDEPENDENT_AMBULATORY_CARE_PROVIDER_SITE_OTHER): Payer: Medicare Other | Admitting: *Deleted

## 2015-10-22 DIAGNOSIS — I495 Sick sinus syndrome: Secondary | ICD-10-CM

## 2015-10-22 NOTE — Telephone Encounter (Signed)
LMOVM reminding pt to send remote transmission.   

## 2015-10-24 ENCOUNTER — Encounter: Payer: Self-pay | Admitting: Cardiology

## 2015-10-24 ENCOUNTER — Telehealth: Payer: Self-pay | Admitting: Internal Medicine

## 2015-10-24 NOTE — Telephone Encounter (Signed)
Called pt back about remote transmission. Instructed pt on how to send a remote. Pts transmission was successfully sent.

## 2015-10-24 NOTE — Telephone Encounter (Signed)
New message  Pt calling to speak w/ Device- stated she received message taht remote transmission from yesterday 6/29 was not sent successfully. Please call back and discuss.

## 2015-10-25 ENCOUNTER — Encounter: Payer: Self-pay | Admitting: Internal Medicine

## 2015-10-27 ENCOUNTER — Other Ambulatory Visit: Payer: Self-pay | Admitting: *Deleted

## 2015-10-27 MED ORDER — SPIRONOLACTONE 25 MG PO TABS: 25.0000 mg | ORAL_TABLET | Freq: Every day | ORAL | Status: DC

## 2015-10-29 ENCOUNTER — Telehealth: Payer: Self-pay | Admitting: Internal Medicine

## 2015-10-29 NOTE — Telephone Encounter (Signed)
[facility]  Patient Name: [name]  DOB: [birth year]    Initial Comment Caller has been having lower back pain spasms    Nurse Assessment      Guidelines    Guideline Title Affirmed Question Affirmed Notes       Final Disposition User   FINAL ATTEMPT MADE - no message left Harlow Mares, Therapist, sports, [employer]

## 2015-10-30 ENCOUNTER — Ambulatory Visit (HOSPITAL_COMMUNITY): Payer: Self-pay | Admitting: Psychiatry

## 2015-10-31 ENCOUNTER — Encounter: Payer: Self-pay | Admitting: Cardiology

## 2015-10-31 DIAGNOSIS — M5441 Lumbago with sciatica, right side: Secondary | ICD-10-CM | POA: Diagnosis not present

## 2015-10-31 NOTE — Progress Notes (Signed)
Remote pacemaker transmission.   

## 2015-11-01 ENCOUNTER — Other Ambulatory Visit: Payer: Self-pay | Admitting: Internal Medicine

## 2015-11-01 ENCOUNTER — Other Ambulatory Visit (HOSPITAL_COMMUNITY): Payer: Self-pay | Admitting: Psychiatry

## 2015-11-04 ENCOUNTER — Ambulatory Visit (HOSPITAL_COMMUNITY)
Admission: RE | Admit: 2015-11-04 | Discharge: 2015-11-04 | Disposition: A | Discharge status: Home / Self Care | Payer: Medicare Other | Source: Ambulatory Visit | Attending: Cardiology | Admitting: Cardiology

## 2015-11-04 ENCOUNTER — Encounter (HOSPITAL_COMMUNITY): Payer: Self-pay

## 2015-11-04 VITALS — BP 148/74 | HR 69 | Wt 166.5 lb

## 2015-11-04 DIAGNOSIS — R0609 Other forms of dyspnea: Secondary | ICD-10-CM | POA: Diagnosis not present

## 2015-11-04 DIAGNOSIS — Z79899 Other long term (current) drug therapy: Secondary | ICD-10-CM | POA: Insufficient documentation

## 2015-11-04 DIAGNOSIS — R0602 Shortness of breath: Secondary | ICD-10-CM | POA: Diagnosis not present

## 2015-11-04 DIAGNOSIS — M109 Gout, unspecified: Secondary | ICD-10-CM | POA: Insufficient documentation

## 2015-11-04 DIAGNOSIS — R0600 Dyspnea, unspecified: Secondary | ICD-10-CM | POA: Diagnosis not present

## 2015-11-04 DIAGNOSIS — I10 Essential (primary) hypertension: Secondary | ICD-10-CM | POA: Diagnosis not present

## 2015-11-04 DIAGNOSIS — I712 Thoracic aortic aneurysm, without rupture: Secondary | ICD-10-CM

## 2015-11-04 DIAGNOSIS — K219 Gastro-esophageal reflux disease without esophagitis: Secondary | ICD-10-CM | POA: Insufficient documentation

## 2015-11-04 DIAGNOSIS — I272 Other secondary pulmonary hypertension: Secondary | ICD-10-CM | POA: Diagnosis not present

## 2015-11-04 DIAGNOSIS — E039 Hypothyroidism, unspecified: Secondary | ICD-10-CM | POA: Diagnosis not present

## 2015-11-04 DIAGNOSIS — F329 Major depressive disorder, single episode, unspecified: Secondary | ICD-10-CM | POA: Diagnosis not present

## 2015-11-04 DIAGNOSIS — I77819 Aortic ectasia, unspecified site: Secondary | ICD-10-CM | POA: Diagnosis not present

## 2015-11-04 DIAGNOSIS — K589 Irritable bowel syndrome without diarrhea: Secondary | ICD-10-CM | POA: Diagnosis not present

## 2015-11-04 DIAGNOSIS — I7121 Aneurysm of the ascending aorta, without rupture: Secondary | ICD-10-CM | POA: Insufficient documentation

## 2015-11-04 LAB — BASIC METABOLIC PANEL
ANION GAP: 8 (ref 5–15)
BUN: 28 mg/dL — AB (ref 6–20)
CHLORIDE: 106 mmol/L (ref 101–111)
CO2: 26 mmol/L (ref 22–32)
Calcium: 9.5 mg/dL (ref 8.9–10.3)
Creatinine, Ser: 0.91 mg/dL (ref 0.44–1.00)
GFR calc Af Amer: >60 mL/min (ref >60)
GFR calc non Af Amer: 57 mL/min — ABNORMAL LOW (ref >60)
GLUCOSE: 95 mg/dL (ref 65–99)
POTASSIUM: 3.8 mmol/L (ref 3.5–5.1)
Sodium: 140 mmol/L (ref 135–145)

## 2015-11-04 LAB — CBC
HCT: 41.1 % (ref 36.0–46.0)
Hemoglobin: 13.5 g/dL (ref 12.0–15.0)
MCH: 32.7 pg (ref 26.0–34.0)
MCHC: 32.8 g/dL (ref 30.0–36.0)
MCV: 99.5 fL (ref 78.0–100.0)
Platelets: 204 10*3/uL (ref 150–400)
RBC: 4.13 MIL/uL (ref 3.87–5.11)
RDW: 13.8 % (ref 11.5–15.5)
WBC: 7.1 10*3/uL (ref 4.0–10.5)

## 2015-11-04 LAB — PROTIME-INR
INR: 1.07 (ref 0.00–1.49)
Prothrombin Time: 14.1 seconds (ref 11.6–15.2)

## 2015-11-04 LAB — BRAIN NATRIURETIC PEPTIDE: B Natriuretic Peptide: 223.6 pg/mL — ABNORMAL HIGH (ref 0.0–100.0)

## 2015-11-04 NOTE — Patient Instructions (Signed)
Please call us back to schedule your heart catheterization  Your physician recommends that you schedule a follow-up appointment in: 3 weeks

## 2015-11-04 NOTE — Progress Notes (Signed)
Patient ID: [name], female   DOB: 1929/05/20, 82 y.o.   MRN: [id number] PCP: [facility] EP: Caryl Comes HF Cardiology: Aundra Dubin  82 yo with history of SA nodal dysfunction s/p PPM, HTN, and chronic exertional dyspnea presents for cardiology followup.  She has been seeing Dr Chase Caller with pulmonary and sees Dr Caryl Comes for her pacemaker. Over the last two years, she has noted significant exertional dyspnea that has been progressive.  She is short of breath with steps or any incline.  She is not short of breath walking on flat ground but is short of breath carrying groceries or doing moderate housework.  No chest pain.  No lightheadedness or palpitations.  There was some concern for asthma, but she says that Breo did not help, and she is no longer using it.   She has had a fairly unrevealing workup so far.  Last echo in 2/15 showed normal EF with mild pulmonary hypertension (PASP 39 mmHg).  PFTs in 3/15 were normal except for a mildly decreased DLCO. She had a high resolution CT of the chest in 5/17 that showed no ILD but PA was dilated suggesting possible pulmonary hypertension.  She was referred to this office for evaluation of possible pulmonary hypertension based on these findings.   Labs (6/17): K 3.9, creatinine 1.01, RF negative, ANA negative  PMH: 1. Gout 2. Depression 3. Hypothyroidism 4. SA node dysfunction with Medtronic PPM. 5. Stress echo (2012) normal. 6. GERD 7. IBS 8. HTN 9. Exertional dyspnea:  - PFTs (3/15) normal except for slightly decreased DLCO - Echo (2/15) with EF 65-70%, mild MR, PASP 39 mmHg.  - High resolution CT chest (5/17) with no evidence for IDL, PA enlarged concerning for pulmonary hypertension. - Possible asthma 10. Ascending aorta dilation: 4.0 cm on CT chest 5/17.   SH: Lives with son, nonsmoker, no ETOH.  FH: CVAs, father with MI, multiple siblings with coronary disease (has > 10 siblings).   ROS: All systems reviewed and negative except as per HPI.    Current Outpatient Prescriptions  Medication Sig Dispense Refill  . albuterol (PROVENTIL HFA;VENTOLIN HFA) 108 (90 Base) MCG/ACT inhaler Inhale 2 puffs into the lungs every 6 (six) hours as needed for wheezing or shortness of breath. 1 Inhaler 6  . allopurinol (ZYLOPRIM) 300 MG tablet Take 1 tablet (300 mg total) by mouth daily. 30 tablet 4  . Cetirizine HCl (ZYRTEC ALLERGY) 10 MG CAPS Take 1 capsule by mouth daily as needed.     . chlorhexidine (PERIDEX) 0.12 % solution     . Cholecalciferol (VITAMIN D3) 2000 UNITS TABS Take 1 tablet by mouth as needed. Reported on 10/21/2015    . escitalopram (LEXAPRO) 10 MG tablet Take 1 tablet (10 mg total) by mouth daily. 90 tablet 0  . fluocinonide cream (LIDEX) 0.05 %     . fluticasone furoate-vilanterol (BREO ELLIPTA) 200-25 MCG/INH AEPB Inhale 1 puff into the lungs daily. 60 each 5  . levothyroxine (SYNTHROID, LEVOTHROID) 100 MCG tablet Take 1 tablet (100 mcg total) by mouth daily. 90 tablet 3  . LORazepam (ATIVAN) 0.5 MG tablet TAKE ONE TABLET BY MOUTH AS NEEDED FOR ANXIETY 30 tablet 1  . magnesium oxide (MAG-OX) 400 MG tablet Take 400 mg by mouth daily. Reported on 10/21/2015    . oxybutynin (DITROPAN) 5 MG tablet TAKE 1 TABLET BY MOUTH 3 TIMES A DAY 90 tablet 0  . pindolol (VISKEN) 5 MG tablet Take 1 tablet (5 mg total) by mouth daily. 90 tablet 1  .  predniSONE (DELTASONE) 5 MG tablet Take 5 mg by mouth daily with breakfast.    . Psyllium (METAMUCIL PO) Once daily    . ranitidine (ZANTAC) 150 MG capsule Take 150 mg by mouth 3 (three) times daily.    [name] spironolactone (ALDACTONE) 25 MG tablet Take 1 tablet (25 mg total) by mouth daily. 90 tablet 1   No current facility-administered medications for this encounter.   BP 148/74 mmHg  Pulse 69  Wt 166 lb 8 oz (75.524 kg)  SpO2 100% General: NAD Neck: No JVD, no thyromegaly or thyroid nodule.  Lungs: Clear to auscultation bilaterally with normal respiratory effort. CV: Nondisplaced PMI.  Heart  regular S1/S2, no S3/S4, 1/6 SEM RUSB.  No peripheral edema.  No carotid bruit.  Normal pedal pulses.  Abdomen: Soft, nontender, no hepatosplenomegaly, no distention.  Skin: Intact without lesions or rashes.  Neurologic: Alert and oriented x 3.  Psych: Normal affect. Extremities: No clubbing or cyanosis.  HEENT: Normal.   Assessment/Plan: 1. Exertional dyspnea:  This has been progressive over the last two years, uncertain etiology.  There was some concern for asthma in the past but Breo inhaler did not help.  Echo in 2/15 showed normal EF with mildly elevated PA systolic pressure.  PFTs in 3/15 were normal except for low DLCO, which could be indicative of pulmonary vascular disease.  Finally, high resolution chest CT in 5/17 did not show interstitial lung disease but did show an enlarged PA, concerning for possible pulmonary hypertension.  NYHA class II-III symptoms.  She does not appear volume overloaded on exam.  - I think that she needs RHC to assess filling pressures and to assess for pulmonary hypertension given the above studies and her exertional dyspnea of uncertain etiology.  I will also do coronary angiography to rule out significant CAD as a cause of her symptoms.  We discussed risks/benefits of the procedures and she agrees to proceed.  - I will check a BNP today.  2. Dilated ascending aorta: 4.0 cm on 5/17 CT.  Repeat CTA chest versus MRA chest in 5/18 to follow.  3. SA nodal dysfunction: She has a Medtronic PPM.  Followed by Dr Caryl Comes.  [name] 11/04/2015

## 2015-11-04 NOTE — Telephone Encounter (Signed)
Will need to have refilled by her PCP.  Thanks!

## 2015-11-05 ENCOUNTER — Other Ambulatory Visit: Payer: Self-pay | Admitting: Internal Medicine

## 2015-11-06 LAB — CUP PACEART REMOTE DEVICE CHECK
Battery Remaining Longevity: 30 mo
Brady Statistic AP VP Percent: 2 %
Brady Statistic AS VS Percent: 13 %
Date Time Interrogation Session: 20170630180819
Implantable Lead Implant Date: 20090527
Implantable Lead Location: 753859
Lead Channel Pacing Threshold Amplitude: 0.5 V
Lead Channel Pacing Threshold Amplitude: 0.625 V
Lead Channel Pacing Threshold Pulse Width: 0.4 ms
Lead Channel Sensing Intrinsic Amplitude: 5.6 mV
Lead Channel Setting Pacing Amplitude: 2 V
Lead Channel Setting Pacing Pulse Width: 0.4 ms
Lead Channel Setting Sensing Sensitivity: 2.8 mV
MDC IDC LEAD IMPLANT DT: 20090527
MDC IDC LEAD LOCATION: 753860
MDC IDC MSMT BATTERY IMPEDANCE: 2134 Ohm
MDC IDC MSMT BATTERY VOLTAGE: 2.75 V
MDC IDC MSMT LEADCHNL RA IMPEDANCE VALUE: 524 Ohm
MDC IDC MSMT LEADCHNL RA PACING THRESHOLD PULSEWIDTH: 0.4 ms
MDC IDC MSMT LEADCHNL RV IMPEDANCE VALUE: 538 Ohm
MDC IDC SET LEADCHNL RV PACING AMPLITUDE: 2.5 V
MDC IDC STAT BRADY AP VS PERCENT: 84 %
MDC IDC STAT BRADY AS VP PERCENT: 1 %

## 2015-11-10 ENCOUNTER — Ambulatory Visit (INDEPENDENT_AMBULATORY_CARE_PROVIDER_SITE_OTHER): Payer: Medicare Other | Admitting: Psychology

## 2015-11-10 DIAGNOSIS — F331 Major depressive disorder, recurrent, moderate: Secondary | ICD-10-CM | POA: Diagnosis not present

## 2015-11-10 DIAGNOSIS — M5441 Lumbago with sciatica, right side: Secondary | ICD-10-CM | POA: Diagnosis not present

## 2015-11-11 ENCOUNTER — Telehealth: Payer: Self-pay | Admitting: Internal Medicine

## 2015-11-11 DIAGNOSIS — I272 Pulmonary hypertension, unspecified: Secondary | ICD-10-CM

## 2015-11-11 NOTE — Telephone Encounter (Signed)
New message      The pt spoke with Dr. Aundra Dubin that the pt was recommended to told the pt she needed a heart cath. And the pt wanted to speak with a nurse she is confused about what  she should do

## 2015-11-11 NOTE — Telephone Encounter (Signed)
I called and spoke with the patient. She states she saw Dr. Aundra Dubin in the [facility] on 11/04/15. He recommended that she have a heart cath done based on symptoms and testing she had done 2 years ago. It appears as thought he has recommended a right heart cath for evaluation of possible pulmonary HTN. She is not wanting to proceed with this, AT ALL, until she hears Dr. Olin Pia opinion on this. I advise her I will have him review and we will be back in touch with her. She is agreeable with this.

## 2015-11-12 ENCOUNTER — Telehealth (HOSPITAL_COMMUNITY): Payer: Self-pay | Admitting: *Deleted

## 2015-11-12 NOTE — Telephone Encounter (Signed)
Called to spoke with [name] She is anxious but willing    I have spoken with Dr. Aundra Dubin. We will plan to do an echo to look at pulmonary pressures. She will see him again in the office in early August and they will discuss the catheterization at that time

## 2015-11-12 NOTE — Telephone Encounter (Signed)
Per Dr. Caryl Comes, the patient is aware she will need a repeat echo. She has follow up with Dr. Aundra Dubin on 8/2 will try to get echo done prior to that appt.

## 2015-11-12 NOTE — Addendum Note (Signed)
Addended by: Alvis Lemmings C on: 11/12/2015 06:32 PM   Modules accepted: Orders

## 2015-11-12 NOTE — Telephone Encounter (Signed)
Pt called to let us know she does not want to sch RHC now, she is waiting to hear back from Dr Caryl Comes on his opinion first.  Advised pt ok to hold off for now, if she would like RHC done to call us back, if not keep f/u appt w/Dr Aundra Dubin on 8/2 she is agreeable

## 2015-11-24 ENCOUNTER — Other Ambulatory Visit (HOSPITAL_COMMUNITY): Payer: Self-pay

## 2015-11-24 ENCOUNTER — Ambulatory Visit (INDEPENDENT_AMBULATORY_CARE_PROVIDER_SITE_OTHER): Payer: Medicare Other | Admitting: Psychology

## 2015-11-24 ENCOUNTER — Ambulatory Visit (HOSPITAL_COMMUNITY): Payer: Medicare Other | Attending: Internal Medicine

## 2015-11-24 DIAGNOSIS — I272 Other secondary pulmonary hypertension: Secondary | ICD-10-CM | POA: Diagnosis not present

## 2015-11-24 DIAGNOSIS — F331 Major depressive disorder, recurrent, moderate: Secondary | ICD-10-CM | POA: Diagnosis not present

## 2015-11-24 DIAGNOSIS — I071 Rheumatic tricuspid insufficiency: Secondary | ICD-10-CM | POA: Insufficient documentation

## 2015-11-26 ENCOUNTER — Encounter (HOSPITAL_COMMUNITY): Payer: Self-pay

## 2015-11-26 ENCOUNTER — Ambulatory Visit (HOSPITAL_COMMUNITY)
Admission: RE | Admit: 2015-11-26 | Discharge: 2015-11-26 | Disposition: A | Discharge status: Home / Self Care | Payer: Medicare Other | Source: Ambulatory Visit | Attending: Cardiology | Admitting: Cardiology

## 2015-11-26 VITALS — BP 126/70 | HR 63 | Wt 165.5 lb

## 2015-11-26 DIAGNOSIS — F329 Major depressive disorder, single episode, unspecified: Secondary | ICD-10-CM | POA: Insufficient documentation

## 2015-11-26 DIAGNOSIS — K589 Irritable bowel syndrome without diarrhea: Secondary | ICD-10-CM | POA: Insufficient documentation

## 2015-11-26 DIAGNOSIS — I10 Essential (primary) hypertension: Secondary | ICD-10-CM | POA: Insufficient documentation

## 2015-11-26 DIAGNOSIS — R0600 Dyspnea, unspecified: Secondary | ICD-10-CM

## 2015-11-26 DIAGNOSIS — M109 Gout, unspecified: Secondary | ICD-10-CM | POA: Insufficient documentation

## 2015-11-26 DIAGNOSIS — R0609 Other forms of dyspnea: Secondary | ICD-10-CM

## 2015-11-26 DIAGNOSIS — I77810 Thoracic aortic ectasia: Secondary | ICD-10-CM | POA: Diagnosis not present

## 2015-11-26 DIAGNOSIS — E039 Hypothyroidism, unspecified: Secondary | ICD-10-CM | POA: Insufficient documentation

## 2015-11-26 DIAGNOSIS — I495 Sick sinus syndrome: Secondary | ICD-10-CM | POA: Diagnosis not present

## 2015-11-26 DIAGNOSIS — K219 Gastro-esophageal reflux disease without esophagitis: Secondary | ICD-10-CM | POA: Diagnosis not present

## 2015-11-26 DIAGNOSIS — Z79899 Other long term (current) drug therapy: Secondary | ICD-10-CM | POA: Diagnosis not present

## 2015-11-26 DIAGNOSIS — Z8249 Family history of ischemic heart disease and other diseases of the circulatory system: Secondary | ICD-10-CM | POA: Insufficient documentation

## 2015-11-26 DIAGNOSIS — Z950 Presence of cardiac pacemaker: Secondary | ICD-10-CM | POA: Insufficient documentation

## 2015-11-26 DIAGNOSIS — Z823 Family history of stroke: Secondary | ICD-10-CM | POA: Insufficient documentation

## 2015-11-26 MED ORDER — POTASSIUM CHLORIDE CRYS ER 20 MEQ PO TBCR: 20.0000 meq | EXTENDED_RELEASE_TABLET | Freq: Every day | ORAL | 3 refills | Status: DC

## 2015-11-26 MED ORDER — FUROSEMIDE 20 MG PO TABS: 20.0000 mg | ORAL_TABLET | Freq: Every day | ORAL | 3 refills | Status: DC

## 2015-11-26 NOTE — Patient Instructions (Signed)
Start Furosemide (Lasix) 20 mg daily  Start Potassium 20 meq daily  Labs in 10 days  Your physician recommends that you schedule a follow-up appointment in: 4 weeks

## 2015-11-27 NOTE — Progress Notes (Signed)
Patient ID: Demiyah Dogan, female   DOB: May 30, 1929, 82 y.o.   MRN: [id number] PCP: [facility] EP: Caryl Comes HF Cardiology: Aundra Dubin  82 yo with history of SA nodal dysfunction s/p PPM, HTN, and chronic exertional dyspnea presents for cardiology followup.  She has been seeing Dr Chase Caller with pulmonary and sees Dr Caryl Comes for her pacemaker. Over the last two years, she has noted significant exertional dyspnea that has been progressive.  She is short of breath with steps or any incline.  She is not short of breath walking on flat ground but is short of breath carrying groceries or doing moderate housework.  No chest pain.  No lightheadedness or palpitations.  There was some concern for asthma, but she says that Breo did not help, and she is no longer using it.   She has had a fairly unrevealing workup so far.  Echo in 2/15 showed normal EF with mild pulmonary hypertension (PASP 39 mmHg).  Recent echo in 7/17, however, showed PA systolic pressure 30 mmHg with normal RV size and systolic function and LVEF 55-60%.  PFTs in 3/15 were normal except for a mildly decreased DLCO. She had a high resolution CT of the chest in 5/17 that showed no ILD but PA was dilated suggesting possible pulmonary hypertension.  She was referred to this office for evaluation of possible pulmonary hypertension based on these findings. At last appointment, we discussed left and right heart cath given ongoing significant exertional dyspnea.  However, she ended up deciding against this.    Of note, she has a lot of stress at home.   Labs (6/17): K 3.9, creatinine 1.01, RF negative, ANA negative Labs (7/17): K 3.8, creatinine 0.91, BNP 224  PMH: 1. Gout 2. Depression 3. Hypothyroidism 4. SA node dysfunction with Medtronic PPM. 5. Stress echo (2012) normal. 6. GERD 7. IBS 8. HTN 9. Exertional dyspnea:  - PFTs (3/15) normal except for slightly decreased DLCO - Echo (2/15) with EF 65-70%, mild MR, PASP 39 mmHg.  - High resolution CT  chest (5/17) with no evidence for IDL, PA enlarged concerning for pulmonary hypertension. - Possible asthma - Echo (7/17): EF 55-60%, mild MR with mitral valve prolapse, moderate TR, normal RV size and systolic function, PASP 30 mmHg.  10. Ascending aorta dilation: 4.0 cm on CT chest 5/17.   SH: Lives with son, nonsmoker, no ETOH.  FH: CVAs, father with MI, multiple siblings with coronary disease (has > 10 siblings).   ROS: All systems reviewed and negative except as per HPI.   Current Outpatient Prescriptions  Medication Sig Dispense Refill  . albuterol (PROVENTIL HFA;VENTOLIN HFA) 108 (90 Base) MCG/ACT inhaler Inhale 2 puffs into the lungs every 6 (six) hours as needed for wheezing or shortness of breath. 1 Inhaler 6  . allopurinol (ZYLOPRIM) 300 MG tablet TAKE 1 TABLET BY MOUTH DAILY 90 tablet 1  . Cetirizine HCl (ZYRTEC ALLERGY) 10 MG CAPS Take 1 capsule by mouth daily as needed.     . chlorhexidine (PERIDEX) 0.12 % solution     . Cholecalciferol (VITAMIN D3) 2000 UNITS TABS Take 1 tablet by mouth as needed. Reported on 10/21/2015    . escitalopram (LEXAPRO) 10 MG tablet Take 1 tablet (10 mg total) by mouth daily. 90 tablet 0  . fluocinonide cream (LIDEX) 0.05 %     . fluticasone furoate-vilanterol (BREO ELLIPTA) 200-25 MCG/INH AEPB Inhale 1 puff into the lungs daily. 60 each 5  . levothyroxine (SYNTHROID, LEVOTHROID) 100 MCG tablet Take 1 tablet (  100 mcg total) by mouth daily. 90 tablet 3  . LORazepam (ATIVAN) 0.5 MG tablet TAKE ONE TABLET BY MOUTH AS NEEDED FOR ANXIETY 30 tablet 1  . magnesium oxide (MAG-OX) 400 MG tablet Take 400 mg by mouth daily. Reported on 10/21/2015    . oxybutynin (DITROPAN) 5 MG tablet TAKE 1 TABLET BY MOUTH 3 TIMES A DAY 90 tablet 0  . pindolol (VISKEN) 5 MG tablet Take 1 tablet (5 mg total) by mouth daily. 90 tablet 1  . Psyllium (METAMUCIL PO) Once daily    . ranitidine (ZANTAC) 150 MG capsule Take 150 mg by mouth 3 (three) times daily.    [name] spironolactone  (ALDACTONE) 25 MG tablet Take 1 tablet (25 mg total) by mouth daily. 90 tablet 1  . furosemide (LASIX) 20 MG tablet Take 1 tablet (20 mg total) by mouth daily. 30 tablet 3  . potassium chloride SA (K-DUR,KLOR-CON) 20 MEQ tablet Take 1 tablet (20 mEq total) by mouth daily. 30 tablet 3   No current facility-administered medications for this encounter.    BP 126/70   Pulse 63   Wt 165 lb 8 oz (75.1 kg)   SpO2 99%   BMI 29.32 kg/m  General: NAD Neck: No JVD, no thyromegaly or thyroid nodule.  Lungs: Clear to auscultation bilaterally with normal respiratory effort. CV: Nondisplaced PMI.  Heart regular S1/S2, no S3/S4, 1/6 SEM RUSB.  No peripheral edema.  No carotid bruit.  Normal pedal pulses.  Abdomen: Soft, nontender, no hepatosplenomegaly, no distention.  Skin: Intact without lesions or rashes.  Neurologic: Alert and oriented x 3.  Psych: Normal affect. Extremities: No clubbing or cyanosis.  HEENT: Normal.   Assessment/Plan: 1. Exertional dyspnea:  This has been progressive over the last two years, uncertain etiology.  There was some concern for asthma in the past but Breo inhaler did not help.  Echo in 2/15 showed normal EF with mildly elevated PA systolic pressure. However, more recent echo in 7/17 showed normal EF and RV function with no pulmonary hypertension.  PFTs in 3/15 were normal except for low DLCO, which could be indicative of pulmonary vascular disease.  Finally, high resolution chest CT in 5/17 did not show interstitial lung disease but did show an enlarged PA, concerning for possible pulmonary hypertension.  NYHA class II-III symptoms.  She does not appear significantly volume overloaded on exam, but BNP was elevated at 224 when recently checked.  - She has been reluctant to have cardiac cath.  Given her ongoing dyspnea of uncertain etiology, this is probably the next step.  However, I told her that was could try empiric Lasix (would use 40 mg daily + KCL 20 mEq daily) with BMET  in 10 days.  If she is significantly improved at followup in 3-4 weeks, could hold off on cath.  If not, she agrees to go ahead with left/right heart cath. 2. Dilated ascending aorta: 4.0 cm on 5/17 CT.  Repeat CTA chest versus MRA chest in 5/18 to follow.  3. SA nodal dysfunction: She has a Medtronic PPM.  Followed by Dr Caryl Comes.  [name] 11/27/2015

## 2015-12-05 ENCOUNTER — Encounter: Payer: Self-pay | Admitting: Internal Medicine

## 2015-12-05 ENCOUNTER — Other Ambulatory Visit: Payer: Self-pay | Admitting: Internal Medicine

## 2015-12-05 ENCOUNTER — Other Ambulatory Visit: Payer: Self-pay | Admitting: *Deleted

## 2015-12-05 DIAGNOSIS — M722 Plantar fascial fibromatosis: Secondary | ICD-10-CM | POA: Diagnosis not present

## 2015-12-05 DIAGNOSIS — M2041 Other hammer toe(s) (acquired), right foot: Secondary | ICD-10-CM | POA: Diagnosis not present

## 2015-12-05 DIAGNOSIS — M2042 Other hammer toe(s) (acquired), left foot: Secondary | ICD-10-CM | POA: Diagnosis not present

## 2015-12-05 MED ORDER — PINDOLOL 5 MG PO TABS: 5.0000 mg | ORAL_TABLET | Freq: Every day | ORAL | 3 refills | Status: DC

## 2015-12-08 ENCOUNTER — Ambulatory Visit: Payer: Medicare Other | Admitting: Psychology

## 2015-12-08 ENCOUNTER — Ambulatory Visit (HOSPITAL_COMMUNITY)
Admission: RE | Admit: 2015-12-08 | Discharge: 2015-12-08 | Disposition: A | Discharge status: Home / Self Care | Payer: Medicare Other | Source: Ambulatory Visit | Attending: Internal Medicine | Admitting: Internal Medicine

## 2015-12-08 DIAGNOSIS — I272 Other secondary pulmonary hypertension: Secondary | ICD-10-CM | POA: Diagnosis not present

## 2015-12-08 LAB — BASIC METABOLIC PANEL
Anion gap: 6 (ref 5–15)
BUN: 15 mg/dL (ref 6–20)
CALCIUM: 9.4 mg/dL (ref 8.9–10.3)
CO2: 30 mmol/L (ref 22–32)
CREATININE: 1.03 mg/dL — AB (ref 0.44–1.00)
Chloride: 106 mmol/L (ref 101–111)
GFR calc Af Amer: 57 mL/min — ABNORMAL LOW (ref >60)
GFR, EST NON AFRICAN AMERICAN: 49 mL/min — AB (ref >60)
Glucose, Bld: 93 mg/dL (ref 65–99)
Potassium: 4.1 mmol/L (ref 3.5–5.1)
SODIUM: 142 mmol/L (ref 135–145)

## 2015-12-12 ENCOUNTER — Other Ambulatory Visit: Payer: Self-pay | Admitting: Internal Medicine

## 2015-12-15 ENCOUNTER — Other Ambulatory Visit: Payer: Self-pay | Admitting: Internal Medicine

## 2015-12-15 MED ORDER — OXYBUTYNIN CHLORIDE 5 MG PO TABS: 5.0000 mg | ORAL_TABLET | Freq: Three times a day (TID) | ORAL | 5 refills | Status: DC

## 2015-12-22 ENCOUNTER — Ambulatory Visit (INDEPENDENT_AMBULATORY_CARE_PROVIDER_SITE_OTHER): Payer: Medicare Other | Admitting: Psychology

## 2015-12-22 ENCOUNTER — Other Ambulatory Visit (HOSPITAL_COMMUNITY): Payer: Self-pay | Admitting: Psychiatry

## 2015-12-22 ENCOUNTER — Telehealth (HOSPITAL_COMMUNITY): Payer: Self-pay | Admitting: *Deleted

## 2015-12-22 DIAGNOSIS — F331 Major depressive disorder, recurrent, moderate: Secondary | ICD-10-CM | POA: Diagnosis not present

## 2015-12-22 DIAGNOSIS — F330 Major depressive disorder, recurrent, mild: Secondary | ICD-10-CM

## 2015-12-22 NOTE — Telephone Encounter (Signed)
Pt left a VM earlier today stating if she was supposed to continue the Lasix and Potassium she would need a new rx sent in.  Per chart meds were sent to the pharmacy on 8/2 with 3 refills.  Attempted to call pt and got her VM, left mess she should continue meds, pharmacy has refills on file call back for further questions/concerns

## 2015-12-30 ENCOUNTER — Ambulatory Visit (HOSPITAL_COMMUNITY): Payer: Self-pay | Admitting: Psychiatry

## 2015-12-30 DIAGNOSIS — M722 Plantar fascial fibromatosis: Secondary | ICD-10-CM | POA: Diagnosis not present

## 2015-12-31 ENCOUNTER — Ambulatory Visit (HOSPITAL_COMMUNITY)
Admission: RE | Admit: 2015-12-31 | Discharge: 2015-12-31 | Disposition: A | Discharge status: Home / Self Care | Payer: Medicare Other | Source: Ambulatory Visit | Attending: Cardiology | Admitting: Cardiology

## 2015-12-31 ENCOUNTER — Encounter (HOSPITAL_COMMUNITY): Payer: Self-pay

## 2015-12-31 VITALS — BP 132/84 | HR 83 | Wt 167.0 lb

## 2015-12-31 DIAGNOSIS — K589 Irritable bowel syndrome without diarrhea: Secondary | ICD-10-CM | POA: Insufficient documentation

## 2015-12-31 DIAGNOSIS — I10 Essential (primary) hypertension: Secondary | ICD-10-CM | POA: Diagnosis not present

## 2015-12-31 DIAGNOSIS — I7121 Aneurysm of the ascending aorta, without rupture: Secondary | ICD-10-CM

## 2015-12-31 DIAGNOSIS — I272 Other secondary pulmonary hypertension: Secondary | ICD-10-CM | POA: Diagnosis not present

## 2015-12-31 DIAGNOSIS — Z823 Family history of stroke: Secondary | ICD-10-CM | POA: Insufficient documentation

## 2015-12-31 DIAGNOSIS — I77819 Aortic ectasia, unspecified site: Secondary | ICD-10-CM | POA: Insufficient documentation

## 2015-12-31 DIAGNOSIS — K219 Gastro-esophageal reflux disease without esophagitis: Secondary | ICD-10-CM | POA: Insufficient documentation

## 2015-12-31 DIAGNOSIS — I341 Nonrheumatic mitral (valve) prolapse: Secondary | ICD-10-CM | POA: Insufficient documentation

## 2015-12-31 DIAGNOSIS — R0609 Other forms of dyspnea: Secondary | ICD-10-CM

## 2015-12-31 DIAGNOSIS — E039 Hypothyroidism, unspecified: Secondary | ICD-10-CM | POA: Diagnosis not present

## 2015-12-31 DIAGNOSIS — R400 Somnolence: Secondary | ICD-10-CM

## 2015-12-31 DIAGNOSIS — I480 Paroxysmal atrial fibrillation: Secondary | ICD-10-CM | POA: Diagnosis not present

## 2015-12-31 DIAGNOSIS — M109 Gout, unspecified: Secondary | ICD-10-CM | POA: Diagnosis not present

## 2015-12-31 DIAGNOSIS — F329 Major depressive disorder, single episode, unspecified: Secondary | ICD-10-CM | POA: Insufficient documentation

## 2015-12-31 DIAGNOSIS — Z8249 Family history of ischemic heart disease and other diseases of the circulatory system: Secondary | ICD-10-CM | POA: Diagnosis not present

## 2015-12-31 DIAGNOSIS — R0600 Dyspnea, unspecified: Secondary | ICD-10-CM

## 2015-12-31 DIAGNOSIS — G4733 Obstructive sleep apnea (adult) (pediatric): Secondary | ICD-10-CM | POA: Insufficient documentation

## 2015-12-31 DIAGNOSIS — Z95810 Presence of automatic (implantable) cardiac defibrillator: Secondary | ICD-10-CM | POA: Insufficient documentation

## 2015-12-31 DIAGNOSIS — I712 Thoracic aortic aneurysm, without rupture: Secondary | ICD-10-CM

## 2015-12-31 LAB — BASIC METABOLIC PANEL
Anion gap: 8 (ref 5–15)
BUN: 18 mg/dL (ref 6–20)
CALCIUM: 9.2 mg/dL (ref 8.9–10.3)
CO2: 26 mmol/L (ref 22–32)
Chloride: 104 mmol/L (ref 101–111)
Creatinine, Ser: 0.9 mg/dL (ref 0.44–1.00)
GFR calc Af Amer: >60 mL/min (ref >60)
GFR calc non Af Amer: 58 mL/min — ABNORMAL LOW (ref >60)
GLUCOSE: 86 mg/dL (ref 65–99)
Potassium: 4.2 mmol/L (ref 3.5–5.1)
Sodium: 138 mmol/L (ref 135–145)

## 2015-12-31 LAB — BRAIN NATRIURETIC PEPTIDE: B Natriuretic Peptide: 124.1 pg/mL — ABNORMAL HIGH (ref 0.0–100.0)

## 2015-12-31 NOTE — Progress Notes (Signed)
Patient ID: [name], female   DOB: 04-20-1930, 82 y.o.   MRN: [id number] PCP: Plotnikov EP: Caryl Comes HF Cardiology: Aundra Dubin  82 yo with history of SA nodal dysfunction s/p PPM, HTN, and chronic exertional dyspnea presents for cardiology followup.  She has been seeing Dr Chase Caller with pulmonary and sees Dr Caryl Comes for her pacemaker. Over the last two years, she has noted significant exertional dyspnea that has been progressive.  She is short of breath with steps or any incline.  She is not short of breath walking on flat ground but is short of breath carrying groceries or doing moderate housework.  No chest pain.  No lightheadedness or palpitations.  There was some concern for asthma, but she says that Breo did not help, and she is no longer using it.  She also has prominent fatigue.   She has had a fairly unrevealing workup so far.  Echo in 2/15 showed normal EF with mild pulmonary hypertension (PASP 39 mmHg).  Recent echo in 7/17, however, showed PA systolic pressure 30 mmHg with normal RV size and systolic function and LVEF 55-60%.  PFTs in 3/15 were normal except for a mildly decreased DLCO. She had a high resolution CT of the chest in 5/17 that showed no ILD but PA was dilated suggesting possible pulmonary hypertension.  She was referred to this office for evaluation of possible pulmonary hypertension based on these findings. At last appointment, we discussed left and right heart cath given ongoing significant exertional dyspnea.  However, she ended up deciding against this.  Instead, I tried her on empiric Lasix 20 mg daily.  She says that this has not changed her dyspnea.  Of note, she was diagnosed with OSA in the past but is no longer using CPAP.   Labs (3/17): TSH normal.  Labs (6/17): K 3.9, creatinine 1.01, RF negative, ANA negative Labs (7/17): K 3.8, creatinine 0.91, BNP 224, HCT 41.1  PMH: 1. Gout 2. Depression 3. Hypothyroidism 4. SA node dysfunction with Medtronic PPM. 5. Stress echo  (2012) normal. 6. GERD 7. IBS 8. HTN 9. Exertional dyspnea:  - PFTs (3/15) normal except for slightly decreased DLCO - Echo (2/15) with EF 65-70%, mild MR, PASP 39 mmHg.  - High resolution CT chest (5/17) with no evidence for IDL, PA enlarged concerning for pulmonary hypertension. - Possible asthma - Echo (7/17): EF 55-60%, mild MR with mitral valve prolapse, moderate TR, normal RV size and systolic function, PASP 30 mmHg.  10. Ascending aorta dilation: 4.0 cm on CT chest 5/17.  11. OSA: Remote diagnosis, no longer using CPAP.   SH: Lives with son, nonsmoker, no ETOH.  FH: CVAs, father with MI, multiple siblings with coronary disease (has > 10 siblings).   ROS: All systems reviewed and negative except as per HPI.   Current Outpatient Prescriptions  Medication Sig Dispense Refill  . allopurinol (ZYLOPRIM) 300 MG tablet TAKE 1 TABLET BY MOUTH DAILY 90 tablet 1  . Cetirizine HCl (ZYRTEC ALLERGY) 10 MG CAPS Take 1 capsule by mouth daily as needed.     . chlorhexidine (PERIDEX) 0.12 % solution     . Cholecalciferol (VITAMIN D3) 2000 UNITS TABS Take 1 tablet by mouth as needed. Reported on 10/21/2015    . escitalopram (LEXAPRO) 10 MG tablet Take 1 tablet (10 mg total) by mouth daily. 90 tablet 0  . fluocinonide cream (LIDEX) 0.05 %     . fluticasone furoate-vilanterol (BREO ELLIPTA) 200-25 MCG/INH AEPB Inhale 1 puff into the lungs  daily. 60 each 5  . furosemide (LASIX) 20 MG tablet Take 1 tablet (20 mg total) by mouth daily. 30 tablet 3  . levothyroxine (SYNTHROID, LEVOTHROID) 100 MCG tablet Take 1 tablet (100 mcg total) by mouth daily. 90 tablet 3  . LORazepam (ATIVAN) 0.5 MG tablet TAKE ONE TABLET BY MOUTH AS NEEDED FOR ANXIETY 30 tablet 1  . magnesium oxide (MAG-OX) 400 MG tablet Take 400 mg by mouth daily. Reported on 10/21/2015    . oxybutynin (DITROPAN) 5 MG tablet Take 1 tablet (5 mg total) by mouth 3 (three) times daily. 90 tablet 5  . pindolol (VISKEN) 5 MG tablet TAKE 1 TABLET BY  MOUTH DAILY 90 tablet 1  . potassium chloride SA (K-DUR,KLOR-CON) 20 MEQ tablet Take 1 tablet (20 mEq total) by mouth daily. 30 tablet 3  . Psyllium (METAMUCIL PO) Once daily    . ranitidine (ZANTAC) 150 MG capsule Take 150 mg by mouth 3 (three) times daily.    [name] spironolactone (ALDACTONE) 25 MG tablet Take 1 tablet (25 mg total) by mouth daily. 90 tablet 1  . albuterol (PROVENTIL HFA;VENTOLIN HFA) 108 (90 Base) MCG/ACT inhaler Inhale 2 puffs into the lungs every 6 (six) hours as needed for wheezing or shortness of breath. (Patient not taking: Reported on 12/31/2015) 1 Inhaler 6   No current facility-administered medications for this encounter.    BP 132/84   Pulse 83   Wt 167 lb (75.8 kg)   SpO2 97%   BMI 29.58 kg/m  General: NAD Neck: No JVD, no thyromegaly or thyroid nodule.  Lungs: Clear to auscultation bilaterally with normal respiratory effort. CV: Nondisplaced PMI.  Heart regular S1/S2, no S3/S4, 1/6 SEM RUSB.  No peripheral edema.  No carotid bruit.  Normal pedal pulses.  Abdomen: Soft, nontender, no hepatosplenomegaly, no distention.  Skin: Intact without lesions or rashes.  Neurologic: Alert and oriented x 3.  Psych: Normal affect. Extremities: No clubbing or cyanosis.  HEENT: Normal.   Assessment/Plan: 1. Exertional dyspnea:  This has been progressive over the last two years, uncertain etiology.  There was some concern for asthma in the past but Breo inhaler did not help.  Echo in 2/15 showed normal EF with mildly elevated PA systolic pressure. However, more recent echo in 7/17 showed normal EF and RV function with no pulmonary hypertension.  PFTs in 3/15 were normal except for low DLCO, which could be indicative of pulmonary vascular disease.  Finally, high resolution chest CT in 5/17 did not show interstitial lung disease but did show an enlarged PA, concerning for possible pulmonary hypertension.  NYHA class II-III symptoms.  She does not appear significantly volume overloaded  on exam, but BNP was elevated at 224 when recently checked.  A trial of Lasix 20 mg daily has not helped her breathing. - Continue Lasix for now.  BMET/BNP today.  - Given ongoing dyspnea with uncertain etiology, I recommended cardiac cath.  I will get a Lexiscan Cardiolite.  If this is normal, I will do RHC only.  If Cardiolite suggests ischemia, I will do RHC/LHC.  I discussed risks/benefits of catheterization today with the patient and she agrees to go ahead with the preceding plan. 2. Dilated ascending aorta: 4.0 cm on 5/17 CT.  Repeat CTA chest versus MRA chest in 5/18 to follow.  3. SA nodal dysfunction: She has a Medtronic PPM.  Followed by Dr Caryl Comes. 4. OSA: Remote history, no longer using CPAP.  This may contribute to daytime fatigue.  I  will arrange for repeat sleep study.   [name] 12/31/2015

## 2015-12-31 NOTE — Patient Instructions (Signed)
Labs today  Your physician has requested that you have a lexiscan myoview. For further information please visit [email or web address]. Please follow instruction sheet, as given.  Your physician has recommended that you have a sleep study. This test records several body functions during sleep, including: brain activity, eye movement, oxygen and carbon dioxide blood levels, heart rate and rhythm, breathing rate and rhythm, the flow of air through your mouth and nose, snoring, body muscle movements, and chest and belly movement.  Your physician recommends that you schedule a follow-up appointment in: 1 month

## 2016-01-06 ENCOUNTER — Other Ambulatory Visit (HOSPITAL_COMMUNITY): Payer: Self-pay | Admitting: Psychiatry

## 2016-01-06 DIAGNOSIS — F330 Major depressive disorder, recurrent, mild: Secondary | ICD-10-CM

## 2016-01-09 ENCOUNTER — Telehealth (HOSPITAL_COMMUNITY): Payer: Self-pay | Admitting: Infectious Diseases

## 2016-01-09 NOTE — Telephone Encounter (Signed)
We can do the right and left heart cath if she is more symptomatic and would like to proceed in that direction.  She has significant exertional dyspnea of uncertain etiology.

## 2016-01-09 NOTE — Telephone Encounter (Signed)
Called to report she is not feeling well and would like to verify testing dates. Saw Dr. Aundra Dubin on 12/31/15 and was scheduled for sleep study and Cardiolite in October; then to proceed with RHC if this was not suggestive of ischemia. Desired plan was to do [facility] however she declined this. She is remorseful she declined the testing previously and was tearful on the phone. Reports she does not have a lot of family around her and 'they don't want to be bothered with her anyway.'   Describes her symptoms to be unchanged since last visit. She is not weighing herself daily but is taking both her lasix and spironolactone as prescribed. She is also concerned she is coughing up mucus with small blood streaks (reports she is on ASA). She reports some DOE with incline and prolonged walking, not at rest and is able to sleep on 1 pillow at night. Denies CP/Palpitations/PND/Orthopnea. Gets up to use the bathroom 4 x a night. Taking lasix/spiro in AM.   Advised her to start monitoring her weights daily and make sure she goes to the testing her physician recommended. She was very appreciative of our conversation. Advised to call clinic to report any worsening of symptoms and we will call her next week to see how she is feeling and to discuss Dr. Claris Gladden original plan for [facility] instead of prolonging possible treatment with doing the Cardiolite first.   She understands the above.

## 2016-01-13 ENCOUNTER — Telehealth (HOSPITAL_COMMUNITY): Payer: Self-pay | Admitting: *Deleted

## 2016-01-13 NOTE — Telephone Encounter (Signed)
Left message on voicemail in reference to upcoming appointment scheduled for 01/14/16. Phone number given for a call back so details instructions can be given.   [name]

## 2016-01-14 ENCOUNTER — Ambulatory Visit (HOSPITAL_COMMUNITY): Payer: Medicare Other | Attending: Cardiology

## 2016-01-14 DIAGNOSIS — R0600 Dyspnea, unspecified: Secondary | ICD-10-CM

## 2016-01-14 DIAGNOSIS — I10 Essential (primary) hypertension: Secondary | ICD-10-CM | POA: Insufficient documentation

## 2016-01-14 DIAGNOSIS — R0609 Other forms of dyspnea: Secondary | ICD-10-CM | POA: Diagnosis not present

## 2016-01-14 LAB — MYOCARDIAL PERFUSION IMAGING
CHL CUP NUCLEAR SSS: 7
CSEPPHR: 61 {beats}/min
LV sys vol: 34 mL
LVDIAVOL: 77 mL (ref 46–106)
NUC STRESS TID: 1.02
RATE: 0.25
Rest HR: 61 {beats}/min
SDS: 2
SRS: 5

## 2016-01-14 MED ORDER — TECHNETIUM TC 99M TETROFOSMIN IV KIT
32.3000 | PACK | Freq: Once | INTRAVENOUS | Status: AC | PRN
  Administered 2016-01-14: 32.3 via INTRAVENOUS
  Filled 2016-01-14: qty 32

## 2016-01-14 MED ORDER — REGADENOSON 0.4 MG/5ML IV SOLN
0.4000 mg | Freq: Once | INTRAVENOUS | Status: AC
  Administered 2016-01-14: 0.4 mg via INTRAVENOUS

## 2016-01-14 MED ORDER — TECHNETIUM TC 99M TETROFOSMIN IV KIT
10.2000 | PACK | Freq: Once | INTRAVENOUS | Status: AC | PRN
  Administered 2016-01-14: 10 via INTRAVENOUS
  Filled 2016-01-14: qty 10

## 2016-01-18 ENCOUNTER — Other Ambulatory Visit (HOSPITAL_COMMUNITY): Payer: Self-pay | Admitting: Psychiatry

## 2016-01-18 DIAGNOSIS — F330 Major depressive disorder, recurrent, mild: Secondary | ICD-10-CM

## 2016-01-19 ENCOUNTER — Ambulatory Visit (INDEPENDENT_AMBULATORY_CARE_PROVIDER_SITE_OTHER): Payer: Medicare Other | Admitting: Psychology

## 2016-01-19 DIAGNOSIS — F331 Major depressive disorder, recurrent, moderate: Secondary | ICD-10-CM

## 2016-01-27 DIAGNOSIS — M722 Plantar fascial fibromatosis: Secondary | ICD-10-CM | POA: Diagnosis not present

## 2016-01-30 ENCOUNTER — Encounter (HOSPITAL_BASED_OUTPATIENT_CLINIC_OR_DEPARTMENT_OTHER): Payer: Self-pay

## 2016-02-02 ENCOUNTER — Ambulatory Visit (INDEPENDENT_AMBULATORY_CARE_PROVIDER_SITE_OTHER): Payer: Medicare Other | Admitting: Psychology

## 2016-02-02 ENCOUNTER — Ambulatory Visit (INDEPENDENT_AMBULATORY_CARE_PROVIDER_SITE_OTHER): Payer: Medicare Other | Admitting: *Deleted

## 2016-02-02 DIAGNOSIS — I495 Sick sinus syndrome: Secondary | ICD-10-CM | POA: Diagnosis not present

## 2016-02-02 DIAGNOSIS — F332 Major depressive disorder, recurrent severe without psychotic features: Secondary | ICD-10-CM | POA: Diagnosis not present

## 2016-02-02 NOTE — Progress Notes (Signed)
Remote pacemaker transmission.   

## 2016-02-03 ENCOUNTER — Encounter: Payer: Self-pay | Admitting: Cardiology

## 2016-02-04 ENCOUNTER — Ambulatory Visit (HOSPITAL_COMMUNITY)
Admission: RE | Admit: 2016-02-04 | Discharge: 2016-02-04 | Disposition: A | Discharge status: Home / Self Care | Payer: Medicare Other | Source: Ambulatory Visit | Attending: Cardiology | Admitting: Cardiology

## 2016-02-04 ENCOUNTER — Other Ambulatory Visit (HOSPITAL_COMMUNITY): Payer: Self-pay | Admitting: *Deleted

## 2016-02-04 ENCOUNTER — Encounter (HOSPITAL_COMMUNITY): Payer: Self-pay | Admitting: *Deleted

## 2016-02-04 ENCOUNTER — Encounter (HOSPITAL_COMMUNITY): Payer: Self-pay

## 2016-02-04 VITALS — BP 98/70 | HR 72 | Wt 166.5 lb

## 2016-02-04 DIAGNOSIS — I10 Essential (primary) hypertension: Secondary | ICD-10-CM | POA: Insufficient documentation

## 2016-02-04 DIAGNOSIS — I77819 Aortic ectasia, unspecified site: Secondary | ICD-10-CM | POA: Diagnosis not present

## 2016-02-04 DIAGNOSIS — C50919 Malignant neoplasm of unspecified site of unspecified female breast: Secondary | ICD-10-CM | POA: Insufficient documentation

## 2016-02-04 DIAGNOSIS — Z8249 Family history of ischemic heart disease and other diseases of the circulatory system: Secondary | ICD-10-CM | POA: Diagnosis not present

## 2016-02-04 DIAGNOSIS — Z950 Presence of cardiac pacemaker: Secondary | ICD-10-CM | POA: Diagnosis not present

## 2016-02-04 DIAGNOSIS — I495 Sick sinus syndrome: Secondary | ICD-10-CM

## 2016-02-04 DIAGNOSIS — K219 Gastro-esophageal reflux disease without esophagitis: Secondary | ICD-10-CM | POA: Diagnosis not present

## 2016-02-04 DIAGNOSIS — I341 Nonrheumatic mitral (valve) prolapse: Secondary | ICD-10-CM | POA: Diagnosis not present

## 2016-02-04 DIAGNOSIS — Z923 Personal history of irradiation: Secondary | ICD-10-CM | POA: Diagnosis not present

## 2016-02-04 DIAGNOSIS — Z823 Family history of stroke: Secondary | ICD-10-CM | POA: Insufficient documentation

## 2016-02-04 DIAGNOSIS — K589 Irritable bowel syndrome without diarrhea: Secondary | ICD-10-CM | POA: Insufficient documentation

## 2016-02-04 DIAGNOSIS — I272 Pulmonary hypertension, unspecified: Secondary | ICD-10-CM

## 2016-02-04 DIAGNOSIS — G4733 Obstructive sleep apnea (adult) (pediatric): Secondary | ICD-10-CM | POA: Diagnosis not present

## 2016-02-04 DIAGNOSIS — R0609 Other forms of dyspnea: Secondary | ICD-10-CM | POA: Diagnosis not present

## 2016-02-04 DIAGNOSIS — M109 Gout, unspecified: Secondary | ICD-10-CM | POA: Insufficient documentation

## 2016-02-04 DIAGNOSIS — Z9889 Other specified postprocedural states: Secondary | ICD-10-CM | POA: Diagnosis not present

## 2016-02-04 DIAGNOSIS — E039 Hypothyroidism, unspecified: Secondary | ICD-10-CM | POA: Insufficient documentation

## 2016-02-04 DIAGNOSIS — R0600 Dyspnea, unspecified: Secondary | ICD-10-CM

## 2016-02-04 DIAGNOSIS — I712 Thoracic aortic aneurysm, without rupture: Secondary | ICD-10-CM | POA: Diagnosis not present

## 2016-02-04 DIAGNOSIS — I7121 Aneurysm of the ascending aorta, without rupture: Secondary | ICD-10-CM

## 2016-02-04 LAB — CUP PACEART REMOTE DEVICE CHECK
Battery Impedance: 2277 Ohm
Battery Voltage: 2.75 V
Brady Statistic AP VP Percent: 2 %
Brady Statistic AS VP Percent: 1 %
Implantable Lead Implant Date: 20090527
Implantable Lead Location: 753860
Implantable Lead Model: 5076
Lead Channel Impedance Value: 506 Ohm
Lead Channel Impedance Value: 558 Ohm
Lead Channel Pacing Threshold Amplitude: 0.5 V
Lead Channel Pacing Threshold Amplitude: 0.75 V
Lead Channel Setting Pacing Pulse Width: 0.4 ms
MDC IDC LEAD IMPLANT DT: 20090527
MDC IDC LEAD LOCATION: 753859
MDC IDC MSMT BATTERY REMAINING LONGEVITY: 28 mo
MDC IDC MSMT LEADCHNL RA PACING THRESHOLD PULSEWIDTH: 0.4 ms
MDC IDC MSMT LEADCHNL RV PACING THRESHOLD PULSEWIDTH: 0.4 ms
MDC IDC SESS DTM: 20171009110438
MDC IDC SET LEADCHNL RA PACING AMPLITUDE: 2 V
MDC IDC SET LEADCHNL RV PACING AMPLITUDE: 2.5 V
MDC IDC SET LEADCHNL RV SENSING SENSITIVITY: 2 mV
MDC IDC STAT BRADY AP VS PERCENT: 85 %
MDC IDC STAT BRADY AS VS PERCENT: 12 %

## 2016-02-04 NOTE — Progress Notes (Signed)
Advanced Heart Failure Medication Review by a Pharmacist  Does the patient  feel that his/her medications are working for him/her?  yes  Has the patient been experiencing any side effects to the medications prescribed?  no  Does the patient measure his/her own blood pressure or blood glucose at home?  yes   Does the patient have any problems obtaining medications due to transportation or finances?   no  Understanding of regimen: good Understanding of indications: good Potential of compliance: good Patient understands to avoid NSAIDs. Patient understands to avoid decongestants.  Issues to address at subsequent visits: None   Pharmacist comments:  [name] is a pleasant 82 yo F presenting without a medication list but with fair recall of her regimen. She reports good compliance with her regimen and her only concern is that she has felt more fatigued/weak lately. She did not have any other medication-related questions or concerns for me at this time.   [name]. [name], PharmD, BCPS, CPP Clinical Pharmacist Pager: [phone] Phone: [phone] 02/04/2016 11:57 AM      Time with patient: 10 minutes Preparation and documentation time: 2 minutes Total time: 12 minutes

## 2016-02-04 NOTE — Patient Instructions (Signed)
Routine lab work today. Will notify you of abnormal results  Follow up with Dr.McLean in 1 month.   Your provider has scheduled you for a right heart cath.  (please see cath letter)

## 2016-02-04 NOTE — Progress Notes (Signed)
Patient ID: [name], female   DOB: 08-24-1929, 82 y.o.   MRN: [id number] PCP: Plotnikov EP: Caryl Comes HF Cardiology: Aundra Dubin  82 yo with history of SA nodal dysfunction s/p PPM, HTN, and chronic exertional dyspnea presents for cardiology followup.  She has been seeing Dr Chase Caller with pulmonary and sees Dr Caryl Comes for her pacemaker. Over the last two years, she has noted significant exertional dyspnea that has been progressive.  She is short of breath with steps or any incline.  She is not short of breath walking on flat ground but is short of breath carrying groceries or doing moderate housework.  No chest pain.  No lightheadedness or palpitations.  There was some concern for asthma, but she says that Breo did not help, and she is no longer using it.  She also has prominent fatigue.   She has had a fairly unrevealing workup so far.  Echo in 2/15 showed normal EF with mild pulmonary hypertension (PASP 39 mmHg).  Recent echo in 7/17, however, showed PA systolic pressure 30 mmHg with normal RV size and systolic function and LVEF 55-60%.  PFTs in 3/15 were normal except for a mildly decreased DLCO. She had a high resolution CT of the chest in 5/17 that showed no ILD but PA was dilated suggesting possible pulmonary hypertension.  She was referred to this office for evaluation of possible pulmonary hypertension based on these findings. In the past, we have discussed left and right heart cath given ongoing significant exertional dyspnea.  However, she ended up deciding against this.  Instead, I tried her on empiric Lasix 20 mg daily.  She says that this has not changed her dyspnea.  Of note, she was diagnosed with OSA in the past but is no longer using CPAP.  Most recently, I had her do a Lexiscan Cardiolite in 9/17.  This was low risk with no ischemia or infarction.   Labs (3/17): TSH normal.  Labs (6/17): K 3.9, creatinine 1.01, RF negative, ANA negative Labs (7/17): K 3.8, creatinine 0.91, BNP 224, HCT  41.1 Labs (9/17): K 4.2, creatinine 0.9, BNP 124  PMH: 1. Gout 2. Depression 3. Hypothyroidism 4. SA node dysfunction with Medtronic PPM. 5. Stress echo (2012) normal. 6. GERD 7. IBS 8. HTN 9. Exertional dyspnea:  - PFTs (3/15) normal except for slightly decreased DLCO - Echo (2/15) with EF 65-70%, mild MR, PASP 39 mmHg.  - High resolution CT chest (5/17) with no evidence for IDL, PA enlarged concerning for pulmonary hypertension. - Possible asthma - Echo (7/17): EF 55-60%, mild MR with mitral valve prolapse, moderate TR, normal RV size and systolic function, PASP 30 mmHg.  - Lexiscan Cardiolite (9/17) with EF 55%, no ischemia/infarction 10. Ascending aorta dilation: 4.0 cm on CT chest 5/17.  11. OSA: Remote diagnosis, no longer using CPAP.   SH: Lives with son, nonsmoker, no ETOH.  FH: CVAs, father with MI, multiple siblings with coronary disease (has > 10 siblings).   ROS: All systems reviewed and negative except as per HPI.   Current Outpatient Prescriptions  Medication Sig Dispense Refill  . allopurinol (ZYLOPRIM) 300 MG tablet TAKE 1 TABLET BY MOUTH DAILY 90 tablet 1  . Cetirizine HCl (ZYRTEC ALLERGY) 10 MG CAPS Take 1 capsule by mouth daily as needed (allergies).     . Cholecalciferol (VITAMIN D3) 2000 UNITS TABS Take 2,000 Units by mouth daily. Reported on 10/21/2015    . escitalopram (LEXAPRO) 10 MG tablet TAKE 1 TABLET BY MOUTH EVERY DAY  90 tablet 0  . fluocinonide cream (LIDEX) [id number] % Apply 1 application topically daily as needed (dry skin).     . fluticasone furoate-vilanterol (BREO ELLIPTA) 200-25 MCG/INH AEPB Inhale 1 puff into the lungs daily. 60 each 5  . furosemide (LASIX) 20 MG tablet Take 1 tablet (20 mg total) by mouth daily. 30 tablet 3  . levothyroxine (SYNTHROID, LEVOTHROID) 100 MCG tablet Take 1 tablet (100 mcg total) by mouth daily. 90 tablet 3  . LORazepam (ATIVAN) 0.5 MG tablet TAKE ONE TABLET BY MOUTH AS NEEDED FOR ANXIETY 30 tablet 1  . magnesium  oxide (MAG-OX) 400 MG tablet Take 400 mg by mouth daily. Reported on 10/21/2015    . oxybutynin (DITROPAN) 5 MG tablet Take 1 tablet (5 mg total) by mouth 3 (three) times daily. 90 tablet 5  . pindolol (VISKEN) 5 MG tablet TAKE 1 TABLET BY MOUTH DAILY 90 tablet 1  . potassium chloride SA (K-DUR,KLOR-CON) 20 MEQ tablet Take 1 tablet (20 mEq total) by mouth daily. 30 tablet 3  . Psyllium (METAMUCIL PO) Once daily    . ranitidine (ZANTAC) 150 MG capsule Take 150 mg by mouth 3 (three) times daily as needed for heartburn.     . spironolactone (ALDACTONE) 25 MG tablet Take 1 tablet (25 mg total) by mouth daily. 90 tablet 1  . albuterol (PROVENTIL HFA;VENTOLIN HFA) 108 (90 Base) MCG/ACT inhaler Inhale 2 puffs into the lungs every 6 (six) hours as needed for wheezing or shortness of breath. (Patient not taking: Reported on 02/04/2016) 1 Inhaler 6   No current facility-administered medications for this encounter.    BP 98/70   Pulse 72   Wt 166 lb 8 oz (75.5 kg)   SpO2 100%   BMI 29.49 kg/m  General: NAD Neck: No JVD, no thyromegaly or thyroid nodule.  Lungs: Clear to auscultation bilaterally with normal respiratory effort. CV: Nondisplaced PMI.  Heart regular S1/S2, no S3/S4, 1/6 SEM RUSB.  No peripheral edema.  No carotid bruit.  Normal pedal pulses.  Abdomen: Soft, nontender, no hepatosplenomegaly, no distention.  Skin: Intact without lesions or rashes.  Neurologic: Alert and oriented x 3.  Psych: Normal affect. Extremities: No clubbing or cyanosis.  HEENT: Normal.   Assessment/Plan: 1. Exertional dyspnea:  This has been progressive over the last two years, uncertain etiology.  There was some concern for asthma in the past but Breo inhaler did not help.  Echo in 2/15 showed normal EF with mildly elevated PA systolic pressure. However, more recent echo in 7/17 showed normal EF and RV function with no pulmonary hypertension.  PFTs in 3/15 were normal except for low DLCO, which could be indicative  of pulmonary vascular disease.  High resolution chest CT in 5/17 did not show interstitial lung disease but did show an enlarged PA, concerning for possible pulmonary hypertension.  NYHA class II-III symptoms.  Finally, Lexiscan Cardiolite in 9/17 showed no ischemia or infarction.  She does not appear significantly volume overloaded on exam, but BNP has been elevated (224 pre-Lasix and 124 after starting Lasix).  A trial of Lasix 20 mg daily has not helped her breathing despite lowering BNP. - Given normal Lexiscan Cardiolite and ongoing exertional dyspnea as well as some findings that could be suggestive of pulmonary hypertension, I suggested today that we arrange for RHC.  After discussion of risks/benefits, she agrees to proceed with the study.  Will use right femoral access given PPM on right and left arm restriction due to breast  cancer with radiation.  2. Dilated ascending aorta: 4.0 cm on 5/17 CT.  Repeat CTA chest versus MRA chest in 5/18 to follow.  3. SA nodal dysfunction: She has a Medtronic PPM.  Followed by Dr Caryl Comes. 4. OSA: Remote history, no longer using CPAP.  This may contribute to daytime fatigue.  I have arranged for repeat sleep study, this is planned for next Thursday.   [name] 02/04/2016

## 2016-02-10 ENCOUNTER — Ambulatory Visit (HOSPITAL_BASED_OUTPATIENT_CLINIC_OR_DEPARTMENT_OTHER): Payer: Medicare Other | Attending: Cardiology | Admitting: Cardiology

## 2016-02-10 DIAGNOSIS — R400 Somnolence: Secondary | ICD-10-CM | POA: Diagnosis not present

## 2016-02-10 DIAGNOSIS — G4733 Obstructive sleep apnea (adult) (pediatric): Secondary | ICD-10-CM | POA: Insufficient documentation

## 2016-02-10 DIAGNOSIS — R0683 Snoring: Secondary | ICD-10-CM | POA: Insufficient documentation

## 2016-02-10 DIAGNOSIS — I493 Ventricular premature depolarization: Secondary | ICD-10-CM | POA: Diagnosis not present

## 2016-02-10 DIAGNOSIS — R5383 Other fatigue: Secondary | ICD-10-CM | POA: Diagnosis present

## 2016-02-10 DIAGNOSIS — Z79899 Other long term (current) drug therapy: Secondary | ICD-10-CM | POA: Diagnosis not present

## 2016-02-11 ENCOUNTER — Ambulatory Visit (HOSPITAL_COMMUNITY): Payer: Self-pay | Admitting: Psychiatry

## 2016-02-13 ENCOUNTER — Encounter (HOSPITAL_COMMUNITY)
Admission: RE | Disposition: A | Discharge status: Home / Self Care | Payer: Self-pay | Source: Ambulatory Visit | Attending: Cardiology

## 2016-02-13 ENCOUNTER — Ambulatory Visit (HOSPITAL_COMMUNITY)
Admission: RE | Admit: 2016-02-13 | Discharge: 2016-02-13 | Disposition: A | Discharge status: Home / Self Care | Payer: Medicare Other | Source: Ambulatory Visit | Attending: Cardiology | Admitting: Cardiology

## 2016-02-13 ENCOUNTER — Encounter (HOSPITAL_COMMUNITY): Payer: Self-pay | Admitting: *Deleted

## 2016-02-13 DIAGNOSIS — Z8249 Family history of ischemic heart disease and other diseases of the circulatory system: Secondary | ICD-10-CM | POA: Insufficient documentation

## 2016-02-13 DIAGNOSIS — Z923 Personal history of irradiation: Secondary | ICD-10-CM | POA: Diagnosis not present

## 2016-02-13 DIAGNOSIS — M109 Gout, unspecified: Secondary | ICD-10-CM | POA: Insufficient documentation

## 2016-02-13 DIAGNOSIS — I341 Nonrheumatic mitral (valve) prolapse: Secondary | ICD-10-CM | POA: Insufficient documentation

## 2016-02-13 DIAGNOSIS — R0600 Dyspnea, unspecified: Secondary | ICD-10-CM | POA: Insufficient documentation

## 2016-02-13 DIAGNOSIS — I10 Essential (primary) hypertension: Secondary | ICD-10-CM | POA: Diagnosis not present

## 2016-02-13 DIAGNOSIS — R0609 Other forms of dyspnea: Secondary | ICD-10-CM | POA: Diagnosis not present

## 2016-02-13 DIAGNOSIS — F329 Major depressive disorder, single episode, unspecified: Secondary | ICD-10-CM | POA: Insufficient documentation

## 2016-02-13 DIAGNOSIS — Z950 Presence of cardiac pacemaker: Secondary | ICD-10-CM | POA: Diagnosis not present

## 2016-02-13 DIAGNOSIS — K589 Irritable bowel syndrome without diarrhea: Secondary | ICD-10-CM | POA: Insufficient documentation

## 2016-02-13 DIAGNOSIS — K219 Gastro-esophageal reflux disease without esophagitis: Secondary | ICD-10-CM | POA: Diagnosis not present

## 2016-02-13 DIAGNOSIS — E039 Hypothyroidism, unspecified: Secondary | ICD-10-CM | POA: Insufficient documentation

## 2016-02-13 DIAGNOSIS — Z853 Personal history of malignant neoplasm of breast: Secondary | ICD-10-CM | POA: Insufficient documentation

## 2016-02-13 DIAGNOSIS — I272 Pulmonary hypertension, unspecified: Secondary | ICD-10-CM

## 2016-02-13 DIAGNOSIS — G4733 Obstructive sleep apnea (adult) (pediatric): Secondary | ICD-10-CM | POA: Diagnosis not present

## 2016-02-13 HISTORY — PX: CARDIAC CATHETERIZATION: SHX172

## 2016-02-13 LAB — CBC
HEMATOCRIT: 40.4 % (ref 36.0–46.0)
HEMOGLOBIN: 13.3 g/dL (ref 12.0–15.0)
MCH: 32.5 pg (ref 26.0–34.0)
MCHC: 32.9 g/dL (ref 30.0–36.0)
MCV: 98.8 fL (ref 78.0–100.0)
Platelets: 199 10*3/uL (ref 150–400)
RBC: 4.09 MIL/uL (ref 3.87–5.11)
RDW: 13.7 % (ref 11.5–15.5)
WBC: 3.9 10*3/uL — ABNORMAL LOW (ref 4.0–10.5)

## 2016-02-13 LAB — POCT I-STAT 3, VENOUS BLOOD GAS (G3P V)
Acid-base deficit: 2 mmol/L (ref 0.0–2.0)
BICARBONATE: 23.7 mmol/L (ref 20.0–28.0)
BICARBONATE: 26 mmol/L (ref 20.0–28.0)
O2 SAT: 68 %
O2 Saturation: 69 %
PCO2 VEN: 43.9 mmHg — AB (ref 44.0–60.0)
PO2 VEN: 38 mmHg (ref 32.0–45.0)
PO2 VEN: 38 mmHg (ref 32.0–45.0)
TCO2: 25 mmol/L (ref 0–100)
TCO2: 27 mmol/L (ref 0–100)
pCO2, Ven: 47.2 mmHg (ref 44.0–60.0)
pH, Ven: 7.341 (ref 7.250–7.430)
pH, Ven: 7.349 (ref 7.250–7.430)

## 2016-02-13 LAB — BASIC METABOLIC PANEL
ANION GAP: 9 (ref 5–15)
BUN: 17 mg/dL (ref 6–20)
CALCIUM: 9.2 mg/dL (ref 8.9–10.3)
CHLORIDE: 104 mmol/L (ref 101–111)
CO2: 26 mmol/L (ref 22–32)
Creatinine, Ser: 0.98 mg/dL (ref 0.44–1.00)
GFR calc non Af Amer: 52 mL/min — ABNORMAL LOW (ref >60)
GFR, EST AFRICAN AMERICAN: 60 mL/min — AB (ref >60)
GLUCOSE: 83 mg/dL (ref 65–99)
POTASSIUM: 4.4 mmol/L (ref 3.5–5.1)
Sodium: 139 mmol/L (ref 135–145)

## 2016-02-13 LAB — PROTIME-INR
INR: 0.94
Prothrombin Time: 12.6 seconds (ref 11.4–15.2)

## 2016-02-13 SURGERY — RIGHT HEART CATH

## 2016-02-13 MED ORDER — SODIUM CHLORIDE 0.9 % IV SOLN
INTRAVENOUS | Status: DC
  Administered 2016-02-13: 10:00:00 via INTRAVENOUS

## 2016-02-13 MED ORDER — ACETAMINOPHEN 325 MG PO TABS: 650.0000 mg | ORAL_TABLET | ORAL | Status: DC | PRN

## 2016-02-13 MED ORDER — LIDOCAINE HCL (PF) 1 % IJ SOLN
INTRAMUSCULAR | Status: DC | PRN
  Administered 2016-02-13: 20 mL

## 2016-02-13 MED ORDER — SODIUM CHLORIDE 0.9% FLUSH: 3.0000 mL | INTRAVENOUS | Status: DC | PRN

## 2016-02-13 MED ORDER — HEPARIN (PORCINE) IN NACL 2-0.9 UNIT/ML-% IJ SOLN
INTRAMUSCULAR | Status: AC
  Filled 2016-02-13: qty 500

## 2016-02-13 MED ORDER — FENTANYL CITRATE (PF) 100 MCG/2ML IJ SOLN
INTRAMUSCULAR | Status: DC | PRN
  Administered 2016-02-13 (×2): 25 ug via INTRAVENOUS

## 2016-02-13 MED ORDER — SODIUM CHLORIDE 0.9 % IV SOLN: 250.0000 mL | INTRAVENOUS | Status: DC | PRN

## 2016-02-13 MED ORDER — MIDAZOLAM HCL 2 MG/2ML IJ SOLN
INTRAMUSCULAR | Status: DC | PRN
  Administered 2016-02-13 (×2): 1 mg via INTRAVENOUS

## 2016-02-13 MED ORDER — LIDOCAINE HCL (PF) 1 % IJ SOLN
INTRAMUSCULAR | Status: AC
  Filled 2016-02-13: qty 30

## 2016-02-13 MED ORDER — MIDAZOLAM HCL 2 MG/2ML IJ SOLN
INTRAMUSCULAR | Status: AC
  Filled 2016-02-13: qty 2

## 2016-02-13 MED ORDER — HEPARIN (PORCINE) IN NACL 2-0.9 UNIT/ML-% IJ SOLN
INTRAMUSCULAR | Status: DC | PRN
  Administered 2016-02-13: 500 mL

## 2016-02-13 MED ORDER — ASPIRIN 81 MG PO CHEW: 81.0000 mg | CHEWABLE_TABLET | ORAL | Status: AC

## 2016-02-13 MED ORDER — SODIUM CHLORIDE 0.9% FLUSH: 3.0000 mL | Freq: Two times a day (BID) | INTRAVENOUS | Status: DC

## 2016-02-13 MED ORDER — ASPIRIN 81 MG PO CHEW
CHEWABLE_TABLET | ORAL | Status: AC
  Filled 2016-02-13: qty 1

## 2016-02-13 MED ORDER — ONDANSETRON HCL 4 MG/2ML IJ SOLN: 4.0000 mg | Freq: Four times a day (QID) | INTRAMUSCULAR | Status: DC | PRN

## 2016-02-13 MED ORDER — FENTANYL CITRATE (PF) 100 MCG/2ML IJ SOLN
INTRAMUSCULAR | Status: AC
  Filled 2016-02-13: qty 2

## 2016-02-13 SURGICAL SUPPLY — 8 items
CATH SWAN GANZ 7F STRAIGHT (CATHETERS) ×1 IMPLANT
PACK CARDIAC CATHETERIZATION (CUSTOM PROCEDURE TRAY) ×2 IMPLANT
PROTECTION STATION PRESSURIZED (MISCELLANEOUS) ×2
SHEATH PINNACLE 7F 10CM (SHEATH) ×1 IMPLANT
STATION PROTECTION PRESSURIZED (MISCELLANEOUS) IMPLANT
TRANSDUCER W/STOPCOCK (MISCELLANEOUS) ×3 IMPLANT
TUBING ART PRESS 72  MALE/FEM (TUBING) ×1
TUBING ART PRESS 72 MALE/FEM (TUBING) IMPLANT

## 2016-02-13 NOTE — H&P (View-Only) (Signed)
Patient ID: [name], female   DOB: 04-24-1930, 82 y.o.   MRN: [id number] PCP: Plotnikov EP: Caryl Comes HF Cardiology: Aundra Dubin  82 yo with history of SA nodal dysfunction s/p PPM, HTN, and chronic exertional dyspnea presents for cardiology followup.  She has been seeing Dr Chase Caller with pulmonary and sees Dr Caryl Comes for her pacemaker. Over the last two years, she has noted significant exertional dyspnea that has been progressive.  She is short of breath with steps or any incline.  She is not short of breath walking on flat ground but is short of breath carrying groceries or doing moderate housework.  No chest pain.  No lightheadedness or palpitations.  There was some concern for asthma, but she says that Breo did not help, and she is no longer using it.  She also has prominent fatigue.   She has had a fairly unrevealing workup so far.  Echo in 2/15 showed normal EF with mild pulmonary hypertension (PASP 39 mmHg).  Recent echo in 7/17, however, showed PA systolic pressure 30 mmHg with normal RV size and systolic function and LVEF 55-60%.  PFTs in 3/15 were normal except for a mildly decreased DLCO. She had a high resolution CT of the chest in 5/17 that showed no ILD but PA was dilated suggesting possible pulmonary hypertension.  She was referred to this office for evaluation of possible pulmonary hypertension based on these findings. In the past, we have discussed left and right heart cath given ongoing significant exertional dyspnea.  However, she ended up deciding against this.  Instead, I tried her on empiric Lasix 20 mg daily.  She says that this has not changed her dyspnea.  Of note, she was diagnosed with OSA in the past but is no longer using CPAP.  Most recently, I had her do a Lexiscan Cardiolite in 9/17.  This was low risk with no ischemia or infarction.   Labs (3/17): TSH normal.  Labs (6/17): K 3.9, creatinine 1.01, RF negative, ANA negative Labs (7/17): K 3.8, creatinine 0.91, BNP 224, HCT  41.1 Labs (9/17): K 4.2, creatinine 0.9, BNP 124  PMH: 1. Gout 2. Depression 3. Hypothyroidism 4. SA node dysfunction with Medtronic PPM. 5. Stress echo (2012) normal. 6. GERD 7. IBS 8. HTN 9. Exertional dyspnea:  - PFTs (3/15) normal except for slightly decreased DLCO - Echo (2/15) with EF 65-70%, mild MR, PASP 39 mmHg.  - High resolution CT chest (5/17) with no evidence for IDL, PA enlarged concerning for pulmonary hypertension. - Possible asthma - Echo (7/17): EF 55-60%, mild MR with mitral valve prolapse, moderate TR, normal RV size and systolic function, PASP 30 mmHg.  - Lexiscan Cardiolite (9/17) with EF 55%, no ischemia/infarction 10. Ascending aorta dilation: 4.0 cm on CT chest 5/17.  11. OSA: Remote diagnosis, no longer using CPAP.   SH: Lives with son, nonsmoker, no ETOH.  FH: CVAs, father with MI, multiple siblings with coronary disease (has > 10 siblings).   ROS: All systems reviewed and negative except as per HPI.   Current Outpatient Prescriptions  Medication Sig Dispense Refill  . allopurinol (ZYLOPRIM) 300 MG tablet TAKE 1 TABLET BY MOUTH DAILY 90 tablet 1  . Cetirizine HCl (ZYRTEC ALLERGY) 10 MG CAPS Take 1 capsule by mouth daily as needed (allergies).     . Cholecalciferol (VITAMIN D3) 2000 UNITS TABS Take 2,000 Units by mouth daily. Reported on 10/21/2015    . escitalopram (LEXAPRO) 10 MG tablet TAKE 1 TABLET BY MOUTH EVERY DAY  90 tablet 0  . fluocinonide cream (LIDEX) [id number] % Apply 1 application topically daily as needed (dry skin).     . fluticasone furoate-vilanterol (BREO ELLIPTA) 200-25 MCG/INH AEPB Inhale 1 puff into the lungs daily. 60 each 5  . furosemide (LASIX) 20 MG tablet Take 1 tablet (20 mg total) by mouth daily. 30 tablet 3  . levothyroxine (SYNTHROID, LEVOTHROID) 100 MCG tablet Take 1 tablet (100 mcg total) by mouth daily. 90 tablet 3  . LORazepam (ATIVAN) 0.5 MG tablet TAKE ONE TABLET BY MOUTH AS NEEDED FOR ANXIETY 30 tablet 1  . magnesium  oxide (MAG-OX) 400 MG tablet Take 400 mg by mouth daily. Reported on 10/21/2015    . oxybutynin (DITROPAN) 5 MG tablet Take 1 tablet (5 mg total) by mouth 3 (three) times daily. 90 tablet 5  . pindolol (VISKEN) 5 MG tablet TAKE 1 TABLET BY MOUTH DAILY 90 tablet 1  . potassium chloride SA (K-DUR,KLOR-CON) 20 MEQ tablet Take 1 tablet (20 mEq total) by mouth daily. 30 tablet 3  . Psyllium (METAMUCIL PO) Once daily    . ranitidine (ZANTAC) 150 MG capsule Take 150 mg by mouth 3 (three) times daily as needed for heartburn.     . spironolactone (ALDACTONE) 25 MG tablet Take 1 tablet (25 mg total) by mouth daily. 90 tablet 1  . albuterol (PROVENTIL HFA;VENTOLIN HFA) 108 (90 Base) MCG/ACT inhaler Inhale 2 puffs into the lungs every 6 (six) hours as needed for wheezing or shortness of breath. (Patient not taking: Reported on 02/04/2016) 1 Inhaler 6   No current facility-administered medications for this encounter.    BP 98/70   Pulse 72   Wt 166 lb 8 oz (75.5 kg)   SpO2 100%   BMI 29.49 kg/m  General: NAD Neck: No JVD, no thyromegaly or thyroid nodule.  Lungs: Clear to auscultation bilaterally with normal respiratory effort. CV: Nondisplaced PMI.  Heart regular S1/S2, no S3/S4, 1/6 SEM RUSB.  No peripheral edema.  No carotid bruit.  Normal pedal pulses.  Abdomen: Soft, nontender, no hepatosplenomegaly, no distention.  Skin: Intact without lesions or rashes.  Neurologic: Alert and oriented x 3.  Psych: Normal affect. Extremities: No clubbing or cyanosis.  HEENT: Normal.   Assessment/Plan: 1. Exertional dyspnea:  This has been progressive over the last two years, uncertain etiology.  There was some concern for asthma in the past but Breo inhaler did not help.  Echo in 2/15 showed normal EF with mildly elevated PA systolic pressure. However, more recent echo in 7/17 showed normal EF and RV function with no pulmonary hypertension.  PFTs in 3/15 were normal except for low DLCO, which could be indicative  of pulmonary vascular disease.  High resolution chest CT in 5/17 did not show interstitial lung disease but did show an enlarged PA, concerning for possible pulmonary hypertension.  NYHA class II-III symptoms.  Finally, Lexiscan Cardiolite in 9/17 showed no ischemia or infarction.  She does not appear significantly volume overloaded on exam, but BNP has been elevated (224 pre-Lasix and 124 after starting Lasix).  A trial of Lasix 20 mg daily has not helped her breathing despite lowering BNP. - Given normal Lexiscan Cardiolite and ongoing exertional dyspnea as well as some findings that could be suggestive of pulmonary hypertension, I suggested today that we arrange for RHC.  After discussion of risks/benefits, she agrees to proceed with the study.  Will use right femoral access given PPM on right and left arm restriction due to breast  cancer with radiation.  2. Dilated ascending aorta: 4.0 cm on 5/17 CT.  Repeat CTA chest versus MRA chest in 5/18 to follow.  3. SA nodal dysfunction: She has a Medtronic PPM.  Followed by Dr Caryl Comes. 4. OSA: Remote history, no longer using CPAP.  This may contribute to daytime fatigue.  I have arranged for repeat sleep study, this is planned for next Thursday.   [name] 02/04/2016

## 2016-02-13 NOTE — Discharge Instructions (Signed)
Groin Surgical Site Care °Refer to this sheet in the next few weeks. These instructions provide you with information about caring for yourself after your procedure. Your health care provider may also give you more specific instructions. Your treatment has been planned according to current medical practices, but problems sometimes occur. Call your health care provider if you have any problems or questions after your procedure. °WHAT TO EXPECT AFTER THE PROCEDURE °After your procedure, it is typical to have the following: °· Bruising at the groin site that usually fades within 1-2 weeks. °· Blood collecting in the tissue (hematoma) that may be painful to the touch. It should usually decrease in size and tenderness within 1-2 weeks. °HOME CARE INSTRUCTIONS °· Take medicines only as directed by your health care provider. °· You may shower 24-48 hours after the procedure or as directed by your health care provider. Remove the bandage (dressing) and gently wash the site with plain soap and water. Pat the area dry with a clean towel. Do not rub the site, because this may cause bleeding. °· Do not take baths, swim, or use a hot tub until your health care provider approves. °· Check your insertion site every day for redness, swelling, or drainage. °· Do not apply powder or lotion to the site. °· Limit use of stairs to twice a day for the first 2-3 days or as directed by your health care provider. °· Do not squat for the first 2-3 days or as directed by your health care provider. °· Do not lift over 10 lb (4.5 kg) for 5 days after your procedure or as directed by your health care provider. °· Ask your health care provider when it is okay to: °¨ Return to work or school. °¨ Resume usual physical activities or sports. °¨ Resume sexual activity. °· Do not drive home if you are discharged the same day as the procedure. Have someone else drive you. °· You may drive 24 hours after the procedure unless otherwise instructed by your  health care provider. °· Do not operate machinery or power tools for 24 hours after the procedure or as directed by your health care provider. °· If your procedure was done as an outpatient procedure, which means that you went home the same day as your procedure, a responsible adult should be with you for the first 24 hours after you arrive home. °· Keep all follow-up visits as directed by your health care provider. This is important. °SEEK MEDICAL CARE IF: °· You have a fever. °· You have chills. °· You have increased bleeding from the groin site. Hold pressure on the site. °SEEK IMMEDIATE MEDICAL CARE IF: °· You have unusual pain at the groin site. °· You have redness, warmth, or swelling at the groin site. °· You have drainage (other than a small amount of blood on the dressing) from the groin site. °· The groin site is bleeding, and the bleeding does not stop after 30 minutes of holding steady pressure on the site. °· Your leg or foot becomes pale, cool, tingly, or numb. °  °This information is not intended to replace advice given to you by your health care provider. Make sure you discuss any questions you have with your health care provider. °  °Document Released: 12/14/2013 Document Reviewed: 12/14/2013 °Elsevier Interactive Patient Education ©2016 [employer] ° °

## 2016-02-13 NOTE — Interval H&P Note (Signed)
History and Physical Interval Note:  02/13/2016 12:20 PM  [name]  has presented today for surgery, with the diagnosis of hp  The various methods of treatment have been discussed with the patient and family. After consideration of risks, benefits and other options for treatment, the patient has consented to  Procedure(s): Right Heart Cath (N/A) as a surgical intervention .  The patient's history has been reviewed, patient examined, no change in status, stable for surgery.  I have reviewed the patient's chart and labs.  Questions were answered to the patient's satisfaction.     Ivon Oelkers [employer]

## 2016-02-16 ENCOUNTER — Encounter (HOSPITAL_COMMUNITY): Payer: Self-pay | Admitting: Cardiology

## 2016-02-16 ENCOUNTER — Ambulatory Visit (INDEPENDENT_AMBULATORY_CARE_PROVIDER_SITE_OTHER): Payer: Medicare Other | Admitting: Psychology

## 2016-02-16 DIAGNOSIS — F332 Major depressive disorder, recurrent severe without psychotic features: Secondary | ICD-10-CM | POA: Diagnosis not present

## 2016-02-16 NOTE — Procedures (Signed)
Patient Name: [name], Can Date: 02/10/2016 Gender: Female D.O.B: 12/17/1929 Age (years): 45 Referring Provider: [name] Height (inches): 63 Interpreting Physician: Fransico Him MD, ABSM Weight (lbs): 163 RPSGT: Madelon Lips BMI: 29 MRN: [id number] Neck Size: 14.00  CLINICAL INFORMATION Sleep Study Type: NPSG Indication for sleep study: Fatigue Epworth Sleepiness Score: 3  SLEEP STUDY TECHNIQUE As per the AASM Manual for the Scoring of Sleep and Associated Events v2.3 (April 2016) with a hypopnea requiring 4% desaturations. The channels recorded and monitored were frontal, central and occipital EEG, electrooculogram (EOG), submentalis EMG (chin), nasal and oral airflow, thoracic and abdominal wall motion, anterior tibialis EMG, snore microphone, electrocardiogram, and pulse oximetry.  MEDICATIONS Medications self-administered by patient taken the night of the study : ESCITALOPRAM, LORAZEPAM, OXYBUTNIN, RANITIDINE  SLEEP ARCHITECTURE The study was initiated at 11:05:48 PM and ended at 5:08:15 AM. Sleep onset time was 42.7 minutes and the sleep efficiency was 37.2%. The total sleep time was 134.7 minutes. Stage REM latency was N/A minutes. The patient spent 42.84% of the night in stage N1 sleep, 57.16% in stage N2 sleep, 0.00% in stage N3 and 0.00% in REM. Alpha intrusion was absent. Supine sleep was 68.52%.  RESPIRATORY PARAMETERS The overall apnea/hypopnea index (AHI) was 12.0 per hour. There were 25 total apneas, including 17 obstructive, 7 central and 1 mixed apneas. There were 2 hypopneas and 19 RERAs. The AHI during Stage REM sleep was N/A per hour. AHI while supine was 15.6 per hour. The mean oxygen saturation was 97.37%. The minimum SpO2 during sleep was 92.00%. Loud snoring was noted during this study.  CARDIAC DATA The 2 lead EKG demonstrated sinus rhythm. The mean heart rate was 58.72 beats per minute. Other EKG findings include: PVCs.  LEG MOVEMENT  DATA The total PLMS were 83 with a resulting PLMS index of 36.97. Associated arousal with leg movement index was 0.4 .  IMPRESSIONS - Mild obstructive sleep apnea occurred during this study (AHI = 12.0/h). - No significant central sleep apnea occurred during this study (CAI = 3.1/h). - The patient had minimal or no oxygen desaturation during the study (Min O2 = 92.00%) - The patient snored with Loud snoring volume. - EKG findings include PVCs. - Moderate periodic limb movements of sleep occurred during the study. No significant associated arousals.  DIAGNOSIS - Obstructive Sleep Apnea (327.23 [G47.33 ICD-10])  RECOMMENDATIONS - Therapeutic CPAP titration to determine optimal pressure required to alleviate sleep disordered breathing.  Recommend that sleep aide be used during the CPAP titration, as the patient had difficulty initiating and maintaining sleep. - Positional therapy avoiding supine position during sleep. - Avoid alcohol, sedatives and other CNS depressants that may worsen sleep apnea and disrupt normal sleep architecture. - Sleep hygiene should be reviewed to assess factors that may improve sleep quality. - Weight management and regular exercise should be initiated or continued if appropriate.       [place], American Board of Sleep Medicine  ELECTRONICALLY SIGNED ON:  02/16/2016, 7:38 PM [place] PH: [phone]   FX: [phone] [place]

## 2016-02-17 ENCOUNTER — Ambulatory Visit: Payer: Self-pay | Admitting: Internal Medicine

## 2016-02-17 ENCOUNTER — Telehealth (HOSPITAL_COMMUNITY): Payer: Self-pay

## 2016-02-17 DIAGNOSIS — R0602 Shortness of breath: Secondary | ICD-10-CM | POA: Diagnosis not present

## 2016-02-17 DIAGNOSIS — R042 Hemoptysis: Secondary | ICD-10-CM | POA: Diagnosis not present

## 2016-02-17 DIAGNOSIS — J301 Allergic rhinitis due to pollen: Secondary | ICD-10-CM | POA: Diagnosis not present

## 2016-02-17 DIAGNOSIS — J302 Other seasonal allergic rhinitis: Secondary | ICD-10-CM | POA: Diagnosis not present

## 2016-02-17 NOTE — Telephone Encounter (Signed)
Patient called to go over results of right heart cath from last Friday. She does not recall speaking to the doctor after the procedure. Advised that filling pressures were normal and did not give any specifics as to why she was dyspneic. Patient endorses she has been dyspneic for many years, moreso with exertion (going up the stairs to her 2nd floor apt). Patient pleased with results and has no other questions/concerns/needs at this time.  Renee Pain, RN

## 2016-02-17 NOTE — Progress Notes (Signed)
02/17/16  Spoke with patient and advised her of sleep study results and Dr.Turners recommendations. She stated that she is 82 years old and tired of going to doctors all the time. She had an appt this morning and she cancelled it. She doesn't agree with the sleep study results. She had to get up and go to the restroom several times due to medications she is taking. She stated I could call her back in a couple of weeks to see if she is ready to be set up for titration. Encouraged patient to ask any questions she may have and at this point she stated she had no questions. She thanked me for calling and being nice and understanding regarding her decision not to set up titration yet. Will route to Dr.Turner as an [place]

## 2016-02-19 ENCOUNTER — Telehealth: Payer: Self-pay | Admitting: Cardiology

## 2016-02-19 DIAGNOSIS — G4733 Obstructive sleep apnea (adult) (pediatric): Secondary | ICD-10-CM

## 2016-02-19 NOTE — Telephone Encounter (Signed)
New Message  Pt call requesting to speak with RN about getting a mask. Pt states she refused the mask, but states she currently does not feel good at all and would like to discuss with RN. Please call back to discuss

## 2016-02-19 NOTE — Telephone Encounter (Signed)
Patient has decided that she does want to proceed with CPAP Titration.   She is aware that I will get her information over to the sleep lab for scheduling, and that she will get a letter in the mail with the date.

## 2016-02-20 ENCOUNTER — Encounter: Payer: Self-pay | Admitting: *Deleted

## 2016-02-29 ENCOUNTER — Other Ambulatory Visit: Payer: Self-pay | Admitting: Internal Medicine

## 2016-03-01 ENCOUNTER — Ambulatory Visit (INDEPENDENT_AMBULATORY_CARE_PROVIDER_SITE_OTHER): Payer: Medicare Other | Admitting: Psychology

## 2016-03-01 DIAGNOSIS — F332 Major depressive disorder, recurrent severe without psychotic features: Secondary | ICD-10-CM

## 2016-03-08 ENCOUNTER — Encounter (HOSPITAL_COMMUNITY): Payer: Self-pay

## 2016-03-08 ENCOUNTER — Ambulatory Visit (HOSPITAL_COMMUNITY)
Admission: RE | Admit: 2016-03-08 | Discharge: 2016-03-08 | Disposition: A | Discharge status: Home / Self Care | Payer: Medicare Other | Source: Ambulatory Visit | Attending: Cardiology | Admitting: Cardiology

## 2016-03-08 VITALS — BP 132/80 | HR 82 | Wt 167.8 lb

## 2016-03-08 DIAGNOSIS — Z950 Presence of cardiac pacemaker: Secondary | ICD-10-CM | POA: Diagnosis not present

## 2016-03-08 DIAGNOSIS — I712 Thoracic aortic aneurysm, without rupture: Secondary | ICD-10-CM | POA: Diagnosis not present

## 2016-03-08 DIAGNOSIS — K219 Gastro-esophageal reflux disease without esophagitis: Secondary | ICD-10-CM | POA: Insufficient documentation

## 2016-03-08 DIAGNOSIS — R0600 Dyspnea, unspecified: Secondary | ICD-10-CM

## 2016-03-08 DIAGNOSIS — M109 Gout, unspecified: Secondary | ICD-10-CM | POA: Diagnosis not present

## 2016-03-08 DIAGNOSIS — Z7982 Long term (current) use of aspirin: Secondary | ICD-10-CM | POA: Diagnosis not present

## 2016-03-08 DIAGNOSIS — I10 Essential (primary) hypertension: Secondary | ICD-10-CM | POA: Diagnosis not present

## 2016-03-08 DIAGNOSIS — G4733 Obstructive sleep apnea (adult) (pediatric): Secondary | ICD-10-CM | POA: Insufficient documentation

## 2016-03-08 DIAGNOSIS — K589 Irritable bowel syndrome without diarrhea: Secondary | ICD-10-CM | POA: Diagnosis not present

## 2016-03-08 DIAGNOSIS — Z8249 Family history of ischemic heart disease and other diseases of the circulatory system: Secondary | ICD-10-CM | POA: Insufficient documentation

## 2016-03-08 DIAGNOSIS — R0609 Other forms of dyspnea: Secondary | ICD-10-CM | POA: Diagnosis not present

## 2016-03-08 DIAGNOSIS — I341 Nonrheumatic mitral (valve) prolapse: Secondary | ICD-10-CM | POA: Diagnosis not present

## 2016-03-08 DIAGNOSIS — I495 Sick sinus syndrome: Secondary | ICD-10-CM

## 2016-03-08 DIAGNOSIS — E039 Hypothyroidism, unspecified: Secondary | ICD-10-CM | POA: Diagnosis not present

## 2016-03-08 NOTE — Progress Notes (Signed)
Patient ID: [name], female   DOB: 1929/10/22, 82 y.o.   MRN: [id number] PCP: Plotnikov EP: Caryl Comes HF Cardiology: Aundra Dubin  82 yo with history of SA nodal dysfunction s/p PPM, HTN, and chronic exertional dyspnea presents for cardiology followup.  She has been seeing Dr Chase Caller with pulmonary and sees Dr Caryl Comes for her pacemaker. Over the last two years, she has noted significant exertional dyspnea that has been progressive.  She is short of breath with steps or any incline.  She is not short of breath walking on flat ground but is short of breath carrying groceries or doing moderate housework.  No chest pain.  No lightheadedness or palpitations.  There was some concern for asthma, but she says that Breo did not help, and she is no longer using it.  She also has prominent fatigue.   Echo in 2/15 showed normal EF with mild pulmonary hypertension (PASP 39 mmHg).  Recent echo in 7/17, however, showed PA systolic pressure 30 mmHg with normal RV size and systolic function and LVEF 55-60%.  PFTs in 3/15 were normal except for a mildly decreased DLCO. She had a high resolution CT of the chest in 5/17 that showed no ILD but PA was dilated suggesting possible pulmonary hypertension.  She was referred to this office for evaluation of possible pulmonary hypertension based on these findings. I tried her on empiric Lasix 20 mg daily.  She says that this has not changed her dyspnea.  Of note, she was diagnosed with OSA in the past but is no longer using CPAP. I had her do a Agricultural consultant in 9/17.  This was low risk with no ischemia or infarction. We finally ended up doing a right heart cath in 10/17 that showed normal filling pressures and no pulmonary hypertension.    Labs (3/17): TSH normal.  Labs (6/17): K 3.9, creatinine 1.01, RF negative, ANA negative Labs (7/17): K 3.8, creatinine 0.91, BNP 224, HCT 41.1 Labs (9/17): K 4.2, creatinine 0.9, BNP 124 Labs (10/17): K 4.4, creatinine 0.98  PMH: 1. Gout 2.  Depression 3. Hypothyroidism 4. SA node dysfunction with Medtronic PPM. 5. Stress echo (2012) normal. 6. GERD 7. IBS 8. HTN 9. Exertional dyspnea:  - PFTs (3/15) normal except for slightly decreased DLCO - Echo (2/15) with EF 65-70%, mild MR, PASP 39 mmHg.  - High resolution CT chest (5/17) with no evidence for IDL, PA enlarged concerning for pulmonary hypertension. - Possible asthma - Echo (7/17): EF 55-60%, mild MR with mitral valve prolapse, moderate TR, normal RV size and systolic function, PASP 30 mmHg.  - Lexiscan Cardiolite (9/17) with EF 55%, no ischemia/infarction - RHC (10/17) with mean RA 6, PA 28/9, mean PCWP 7, CI 2.37 10. Ascending aorta dilation: 4.0 cm on CT chest 5/17.  11. OSA: Remote diagnosis, no longer using CPAP.  Sleep study in 10/17 showed mild OSA.   SH: Lives with son, nonsmoker, no ETOH.  FH: CVAs, father with MI, multiple siblings with coronary disease (has > 10 siblings).   ROS: All systems reviewed and negative except as per HPI.   Current Outpatient Prescriptions  Medication Sig Dispense Refill  . albuterol (PROVENTIL HFA;VENTOLIN HFA) 108 (90 Base) MCG/ACT inhaler Inhale 2 puffs into the lungs every 6 (six) hours as needed for wheezing or shortness of breath. 1 Inhaler 6  . allopurinol (ZYLOPRIM) 300 MG tablet TAKE 1 TABLET BY MOUTH DAILY 30 tablet 1  . aspirin 81 MG chewable tablet Chew 81 mg by mouth  daily.    . cetirizine (ZYRTEC) 10 MG tablet Take 10 mg by mouth daily as needed for allergies.    [name] escitalopram (LEXAPRO) 10 MG tablet TAKE 1 TABLET BY MOUTH EVERY DAY (Patient taking differently: TAKE 1 TABLET (10 MG) BY MOUTH DAILY AT BEDTIME.) 90 tablet 0  . fluocinonide cream (LIDEX) [id number] % Apply 1 application topically daily as needed (For spots on face/itching.).     [name] fluticasone furoate-vilanterol (BREO ELLIPTA) 200-25 MCG/INH AEPB Inhale 1 puff into the lungs daily. 60 each 5  . furosemide (LASIX) 20 MG tablet Take 1 tablet (20 mg total) by  mouth daily. 30 tablet 3  . hydroxypropyl methylcellulose / hypromellose (ISOPTO TEARS / GONIOVISC) 2.5 % ophthalmic solution Place 1-2 drops into both eyes 3 (three) times daily as needed for dry eyes.    [name] levothyroxine (SYNTHROID, LEVOTHROID) 100 MCG tablet Take 1 tablet (100 mcg total) by mouth daily. (Patient taking differently: Take 100 mcg by mouth daily before breakfast. ) 90 tablet 3  . LORazepam (ATIVAN) 0.5 MG tablet TAKE ONE TABLET BY MOUTH AS NEEDED FOR ANXIETY (Patient taking differently: Take 0.5 mg by mouth 2 (two) times daily as needed (for anxiety.). ) 30 tablet 1  . oxybutynin (DITROPAN) 5 MG tablet Take 1 tablet (5 mg total) by mouth 3 (three) times daily. (Patient taking differently: Take 5 mg by mouth 2 (two) times daily. ) 90 tablet 5  . pindolol (VISKEN) 5 MG tablet TAKE 1 TABLET BY MOUTH DAILY 90 tablet 1  . potassium chloride SA (K-DUR,KLOR-CON) 20 MEQ tablet Take 1 tablet (20 mEq total) by mouth daily. 30 tablet 3  . psyllium (METAMUCIL SMOOTH TEXTURE) 28 % packet Take 1 packet by mouth daily with breakfast.    . ranitidine (ZANTAC) 150 MG tablet Take 150 mg by mouth 3 (three) times daily.    [name] spironolactone (ALDACTONE) 25 MG tablet Take 1 tablet (25 mg total) by mouth daily. 90 tablet 1   No current facility-administered medications for this encounter.    BP 132/80   Pulse 82   Wt 167 lb 12.8 oz (76.1 kg)   SpO2 96%   BMI 29.72 kg/m  General: NAD Neck: No JVD, no thyromegaly or thyroid nodule.  Lungs: Occasional rhonchi CV: Nondisplaced PMI.  Heart regular S1/S2, no S3/S4, 1/6 SEM RUSB.  No peripheral edema.  No carotid bruit.  Normal pedal pulses.  Abdomen: Soft, nontender, no hepatosplenomegaly, no distention.  Skin: Intact without lesions or rashes.  Neurologic: Alert and oriented x 3.  Psych: Normal affect. Extremities: No clubbing or cyanosis.  HEENT: Normal.   Assessment/Plan: 1. Exertional dyspnea:  This has been progressive over the last two years,  uncertain etiology.  There was some concern for asthma in the past but Breo inhaler did not help.  Echo in 2/15 showed normal EF with mildly elevated PA systolic pressure. However, more recent echo in 7/17 showed normal EF and RV function with no pulmonary hypertension.  PFTs in 3/15 were normal except for low DLCO, which could be indicative of pulmonary vascular disease.  High resolution chest CT in 5/17 did not show interstitial lung disease but did show an enlarged PA, concerning for possible pulmonary hypertension.  NYHA class II-III symptoms.  Lexiscan Cardiolite in 9/17 showed no ischemia or infarction.  She does not appear significantly volume overloaded on exam, but BNP has been elevated (224 pre-Lasix and 124 after starting Lasix).  A trial of Lasix 20 mg daily has  not helped her breathing despite lowering BNP.  Finally, I did a right heart cath in 10/17.  This showed normal filling pressures and no pulmonary hypertension.  I suspect the dyspnea is due to deconditioning and perhaps mild diastolic CHF given elevated BNP.  This is well-compensated on low dose Lasix. No further workup at this time.  2. Dilated ascending aorta: 4.0 cm on 5/17 CT.  Repeat CTA chest versus MRA chest in 5/18 to follow.  3. SA nodal dysfunction: She has a Medtronic PPM.  Followed by Dr Caryl Comes. 4. OSA: Only mild on recent sleep study.  I will see her back in 6 months.    [name] 03/09/2016

## 2016-03-08 NOTE — Patient Instructions (Signed)
Follow up in 6 months 

## 2016-03-13 ENCOUNTER — Other Ambulatory Visit (HOSPITAL_COMMUNITY): Payer: Self-pay | Admitting: Psychiatry

## 2016-03-13 DIAGNOSIS — F330 Major depressive disorder, recurrent, mild: Secondary | ICD-10-CM

## 2016-03-14 ENCOUNTER — Other Ambulatory Visit (HOSPITAL_COMMUNITY): Payer: Self-pay | Admitting: Cardiology

## 2016-03-15 ENCOUNTER — Encounter: Payer: Self-pay | Admitting: Internal Medicine

## 2016-03-15 ENCOUNTER — Ambulatory Visit (INDEPENDENT_AMBULATORY_CARE_PROVIDER_SITE_OTHER): Payer: Medicare Other | Admitting: Internal Medicine

## 2016-03-15 ENCOUNTER — Ambulatory Visit: Payer: Medicare Other | Admitting: Psychology

## 2016-03-15 DIAGNOSIS — L930 Discoid lupus erythematosus: Secondary | ICD-10-CM | POA: Diagnosis not present

## 2016-03-15 DIAGNOSIS — E034 Atrophy of thyroid (acquired): Secondary | ICD-10-CM | POA: Diagnosis not present

## 2016-03-15 DIAGNOSIS — F411 Generalized anxiety disorder: Secondary | ICD-10-CM | POA: Diagnosis not present

## 2016-03-15 DIAGNOSIS — I119 Hypertensive heart disease without heart failure: Secondary | ICD-10-CM | POA: Diagnosis not present

## 2016-03-15 DIAGNOSIS — R0600 Dyspnea, unspecified: Secondary | ICD-10-CM

## 2016-03-15 DIAGNOSIS — F330 Major depressive disorder, recurrent, mild: Secondary | ICD-10-CM

## 2016-03-15 DIAGNOSIS — K580 Irritable bowel syndrome with diarrhea: Secondary | ICD-10-CM

## 2016-03-15 DIAGNOSIS — R0609 Other forms of dyspnea: Secondary | ICD-10-CM

## 2016-03-15 MED ORDER — LORAZEPAM 0.5 MG PO TABS: 0.5000 mg | ORAL_TABLET | Freq: Two times a day (BID) | ORAL | 3 refills | Status: DC | PRN

## 2016-03-15 NOTE — Assessment & Plan Note (Signed)
Better w/Lasix, Furosemide

## 2016-03-15 NOTE — Assessment & Plan Note (Signed)
Face, neck F/u w/Dermatology

## 2016-03-15 NOTE — Progress Notes (Signed)
Subjective:  Patient ID: [name], female    DOB: May 23, 1929  Age: 82 y.o. MRN: [id number]  CC: No chief complaint on file.   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 She does not want to go back to the CHF clinic or to her dermatologist...  Outpatient Medications Prior to Visit  Medication Sig Dispense Refill  . allopurinol (ZYLOPRIM) 300 MG tablet TAKE 1 TABLET BY MOUTH DAILY 30 tablet 1  . aspirin 81 MG chewable tablet Chew 81 mg by mouth daily.    . cetirizine (ZYRTEC) 10 MG tablet Take 10 mg by mouth daily as needed for allergies.    [name] escitalopram (LEXAPRO) 10 MG tablet TAKE 1 TABLET BY MOUTH EVERY DAY (Patient taking differently: TAKE 1 TABLET (10 MG) BY MOUTH DAILY AT BEDTIME.) 90 tablet 0  . fluocinonide cream (LIDEX) [id number] % Apply 1 application topically daily as needed (For spots on face/itching.).     [name] furosemide (LASIX) 20 MG tablet TAKE 1 TABLET (20 MG TOTAL) BY MOUTH DAILY. 30 tablet 3  . hydroxypropyl methylcellulose / hypromellose (ISOPTO TEARS / GONIOVISC) 2.5 % ophthalmic solution Place 1-2 drops into both eyes 3 (three) times daily as needed for dry eyes.    [name] KLOR-CON M20 20 MEQ tablet TAKE 1 TABLET (20 MEQ TOTAL) BY MOUTH DAILY. 30 tablet 3  . levothyroxine (SYNTHROID, LEVOTHROID) 100 MCG tablet Take 1 tablet (100 mcg total) by mouth daily. (Patient taking differently: Take 100 mcg by mouth daily before breakfast. ) 90 tablet 3  . LORazepam (ATIVAN) 0.5 MG tablet TAKE ONE TABLET BY MOUTH AS NEEDED FOR ANXIETY (Patient taking differently: Take 0.5 mg by mouth 2 (two) times daily as needed (for anxiety.). ) 30 tablet 1  . oxybutynin (DITROPAN) 5 MG tablet Take 1 tablet (5 mg total) by mouth 3 (three) times daily. (Patient taking differently: Take 5 mg by mouth 2 (two) times daily. ) 90 tablet 5  . pindolol (VISKEN) 5 MG tablet TAKE 1 TABLET BY MOUTH DAILY 90 tablet 1  . psyllium (METAMUCIL SMOOTH TEXTURE) 28 %  packet Take 1 packet by mouth daily with breakfast.    . ranitidine (ZANTAC) 150 MG tablet Take 150 mg by mouth 3 (three) times daily.    [name] spironolactone (ALDACTONE) 25 MG tablet Take 1 tablet (25 mg total) by mouth daily. 90 tablet 1  . albuterol (PROVENTIL HFA;VENTOLIN HFA) 108 (90 Base) MCG/ACT inhaler Inhale 2 puffs into the lungs every 6 (six) hours as needed for wheezing or shortness of breath. (Patient not taking: Reported on 03/15/2016) 1 Inhaler 6  . fluticasone furoate-vilanterol (BREO ELLIPTA) 200-25 MCG/INH AEPB Inhale 1 puff into the lungs daily. (Patient not taking: Reported on 03/15/2016) 60 each 5   No facility-administered medications prior to visit.     ROS Review of Systems  Constitutional: Positive for fatigue. Negative for activity change, appetite change, chills and unexpected weight change.  HENT: Negative for congestion, mouth sores and sinus pressure.   Eyes: Negative for visual disturbance.  Respiratory: Positive for shortness of breath. Negative for cough and chest tightness.   Gastrointestinal: Negative for abdominal pain and nausea.  Genitourinary: Negative for difficulty urinating, frequency and vaginal pain.  Musculoskeletal: Negative for back pain and gait problem.  Skin: Positive for color change and rash. Negative for pallor.  Neurological: Negative for dizziness, tremors, weakness, numbness and headaches.  Psychiatric/Behavioral: Negative for confusion and sleep disturbance.  Objective:  BP 118/68   Pulse 63   Wt 168 lb (76.2 kg)   SpO2 97%   BMI 29.76 kg/m   BP Readings from Last 3 Encounters:  03/15/16 118/68  03/08/16 132/80  02/13/16 110/80    Wt Readings from Last 3 Encounters:  03/15/16 168 lb (76.2 kg)  03/08/16 167 lb 12.8 oz (76.1 kg)  02/13/16 163 lb (73.9 kg)    Physical Exam  Constitutional: She appears well-developed. No distress.  HENT:  Head: Normocephalic.  Right Ear: External ear normal.  Left Ear: External ear  normal.  Nose: Nose normal.  Mouth/Throat: Oropharynx is clear and moist.  Eyes: Conjunctivae are normal. Pupils are equal, round, and reactive to light. Right eye exhibits no discharge. Left eye exhibits no discharge.  Neck: Normal range of motion. Neck supple. No JVD present. No tracheal deviation present. No thyromegaly present.  Cardiovascular: Normal rate, regular rhythm and normal heart sounds.   Pulmonary/Chest: No stridor. No respiratory distress. She has no wheezes.  Abdominal: Soft. Bowel sounds are normal. She exhibits no distension and no mass. There is no tenderness. There is no rebound and no guarding.  Musculoskeletal: She exhibits no edema or tenderness.  Lymphadenopathy:    She has no cervical adenopathy.  Neurological: She displays normal reflexes. No cranial nerve deficit. She exhibits normal muscle tone. Coordination normal.  Skin: No rash noted. No erythema.  Psychiatric: Her behavior is normal. Judgment and thought content normal.    Lab Results  Component Value Date   WBC 3.9 (L) 02/13/2016   HGB 13.3 02/13/2016   HCT 40.4 02/13/2016   PLT 199 02/13/2016   GLUCOSE 83 02/13/2016   CHOL 174 05/11/2010   TRIG 78.0 05/11/2010   HDL 52.30 05/11/2010   LDLDIRECT 130.8 09/30/2006   LDLCALC 106 (H) 05/11/2010   ALT 9 07/21/2015   AST 14 07/21/2015   NA 139 02/13/2016   K 4.4 02/13/2016   CL 104 02/13/2016   CREATININE 0.98 02/13/2016   BUN 17 02/13/2016   CO2 26 02/13/2016   TSH 1.36 07/21/2015   INR 0.94 02/13/2016   HGBA1C 6.0 04/10/2012    No results found.  Assessment & Plan:   There are no diagnoses linked to this encounter. I am having Ms. Patil maintain her LORazepam, levothyroxine, albuterol, fluticasone furoate-vilanterol, fluocinonide cream, spironolactone, pindolol, oxybutynin, escitalopram, ranitidine, cetirizine, hydroxypropyl methylcellulose / hypromellose, psyllium, aspirin, allopurinol, furosemide, and KLOR-CON M20.  No orders of the  defined types were placed in this encounter.    Follow-up: No Follow-up on file.  Walker Kehr, MD

## 2016-03-15 NOTE — Assessment & Plan Note (Signed)
Pindolol, Spironolactone

## 2016-03-15 NOTE — Assessment & Plan Note (Signed)
On Lexapro 

## 2016-03-15 NOTE — Progress Notes (Signed)
Pre visit review using our clinic review tool, if applicable. No additional management support is needed unless otherwise documented below in the visit note. 

## 2016-03-15 NOTE — Assessment & Plan Note (Signed)
F/u w/Dr Medoff 

## 2016-03-15 NOTE — Assessment & Plan Note (Signed)
On Levothroid 

## 2016-03-22 ENCOUNTER — Observation Stay (HOSPITAL_COMMUNITY)
Admission: EM | Admit: 2016-03-22 | Discharge: 2016-03-23 | Disposition: A | Discharge status: Home / Self Care | Payer: Medicare Other | Attending: Internal Medicine | Admitting: Internal Medicine

## 2016-03-22 ENCOUNTER — Emergency Department (HOSPITAL_COMMUNITY): Payer: Medicare Other

## 2016-03-22 ENCOUNTER — Encounter (HOSPITAL_COMMUNITY): Payer: Self-pay | Admitting: Emergency Medicine

## 2016-03-22 DIAGNOSIS — K589 Irritable bowel syndrome without diarrhea: Secondary | ICD-10-CM | POA: Insufficient documentation

## 2016-03-22 DIAGNOSIS — I5032 Chronic diastolic (congestive) heart failure: Secondary | ICD-10-CM | POA: Diagnosis not present

## 2016-03-22 DIAGNOSIS — Z950 Presence of cardiac pacemaker: Secondary | ICD-10-CM | POA: Diagnosis not present

## 2016-03-22 DIAGNOSIS — I2510 Atherosclerotic heart disease of native coronary artery without angina pectoris: Secondary | ICD-10-CM | POA: Diagnosis present

## 2016-03-22 DIAGNOSIS — M1990 Unspecified osteoarthritis, unspecified site: Secondary | ICD-10-CM | POA: Insufficient documentation

## 2016-03-22 DIAGNOSIS — Z66 Do not resuscitate: Secondary | ICD-10-CM | POA: Diagnosis not present

## 2016-03-22 DIAGNOSIS — Z9013 Acquired absence of bilateral breasts and nipples: Secondary | ICD-10-CM | POA: Insufficient documentation

## 2016-03-22 DIAGNOSIS — Z7982 Long term (current) use of aspirin: Secondary | ICD-10-CM | POA: Insufficient documentation

## 2016-03-22 DIAGNOSIS — F329 Major depressive disorder, single episode, unspecified: Secondary | ICD-10-CM | POA: Diagnosis present

## 2016-03-22 DIAGNOSIS — I130 Hypertensive heart and chronic kidney disease with heart failure and stage 1 through stage 4 chronic kidney disease, or unspecified chronic kidney disease: Secondary | ICD-10-CM | POA: Diagnosis not present

## 2016-03-22 DIAGNOSIS — R05 Cough: Secondary | ICD-10-CM | POA: Diagnosis not present

## 2016-03-22 DIAGNOSIS — N183 Chronic kidney disease, stage 3 unspecified: Secondary | ICD-10-CM | POA: Diagnosis present

## 2016-03-22 DIAGNOSIS — E039 Hypothyroidism, unspecified: Secondary | ICD-10-CM | POA: Insufficient documentation

## 2016-03-22 DIAGNOSIS — K219 Gastro-esophageal reflux disease without esophagitis: Secondary | ICD-10-CM | POA: Insufficient documentation

## 2016-03-22 DIAGNOSIS — I472 Ventricular tachycardia: Secondary | ICD-10-CM | POA: Diagnosis not present

## 2016-03-22 DIAGNOSIS — I129 Hypertensive chronic kidney disease with stage 1 through stage 4 chronic kidney disease, or unspecified chronic kidney disease: Secondary | ICD-10-CM | POA: Diagnosis not present

## 2016-03-22 DIAGNOSIS — I495 Sick sinus syndrome: Secondary | ICD-10-CM | POA: Diagnosis not present

## 2016-03-22 DIAGNOSIS — R42 Dizziness and giddiness: Secondary | ICD-10-CM | POA: Diagnosis not present

## 2016-03-22 DIAGNOSIS — G4733 Obstructive sleep apnea (adult) (pediatric): Secondary | ICD-10-CM | POA: Diagnosis not present

## 2016-03-22 DIAGNOSIS — R404 Transient alteration of awareness: Secondary | ICD-10-CM | POA: Diagnosis not present

## 2016-03-22 DIAGNOSIS — R55 Syncope and collapse: Secondary | ICD-10-CM | POA: Diagnosis not present

## 2016-03-22 DIAGNOSIS — F32A Depression, unspecified: Secondary | ICD-10-CM | POA: Diagnosis present

## 2016-03-22 DIAGNOSIS — I10 Essential (primary) hypertension: Secondary | ICD-10-CM | POA: Diagnosis present

## 2016-03-22 DIAGNOSIS — Z853 Personal history of malignant neoplasm of breast: Secondary | ICD-10-CM | POA: Diagnosis not present

## 2016-03-22 HISTORY — DX: Hypothyroidism, unspecified: E03.9

## 2016-03-22 HISTORY — DX: Gout, unspecified: M10.9

## 2016-03-22 HISTORY — DX: Malignant neoplasm of unspecified site of left female breast: C50.912

## 2016-03-22 HISTORY — DX: Obstructive sleep apnea (adult) (pediatric): G47.33

## 2016-03-22 HISTORY — DX: Dependence on other enabling machines and devices: Z99.89

## 2016-03-22 HISTORY — DX: Malignant neoplasm of unspecified site of right female breast: C50.911

## 2016-03-22 HISTORY — DX: Heart failure, unspecified: I50.9

## 2016-03-22 LAB — CBC
HEMATOCRIT: 38.4 % (ref 36.0–46.0)
HEMOGLOBIN: 12.6 g/dL (ref 12.0–15.0)
MCH: 32.6 pg (ref 26.0–34.0)
MCHC: 32.8 g/dL (ref 30.0–36.0)
MCV: 99.5 fL (ref 78.0–100.0)
Platelets: 152 10*3/uL (ref 150–400)
RBC: 3.86 MIL/uL — AB (ref 3.87–5.11)
RDW: 13.8 % (ref 11.5–15.5)
WBC: 3.3 10*3/uL — ABNORMAL LOW (ref 4.0–10.5)

## 2016-03-22 LAB — URINE MICROSCOPIC-ADD ON

## 2016-03-22 LAB — BASIC METABOLIC PANEL
ANION GAP: 6 (ref 5–15)
BUN: 16 mg/dL (ref 6–20)
CO2: 24 mmol/L (ref 22–32)
Calcium: 8.7 mg/dL — ABNORMAL LOW (ref 8.9–10.3)
Chloride: 108 mmol/L (ref 101–111)
Creatinine, Ser: 1.1 mg/dL — ABNORMAL HIGH (ref 0.44–1.00)
GFR, EST AFRICAN AMERICAN: 52 mL/min — AB (ref >60)
GFR, EST NON AFRICAN AMERICAN: 45 mL/min — AB (ref >60)
GLUCOSE: 81 mg/dL (ref 65–99)
POTASSIUM: 4.3 mmol/L (ref 3.5–5.1)
Sodium: 138 mmol/L (ref 135–145)

## 2016-03-22 LAB — URINALYSIS, ROUTINE W REFLEX MICROSCOPIC
Bilirubin Urine: NEGATIVE
GLUCOSE, UA: NEGATIVE mg/dL
Hgb urine dipstick: NEGATIVE
KETONES UR: NEGATIVE mg/dL
NITRITE: NEGATIVE
PROTEIN: NEGATIVE mg/dL
Specific Gravity, Urine: 1.01 (ref 1.005–1.030)
pH: 7.5 (ref 5.0–8.0)

## 2016-03-22 LAB — I-STAT TROPONIN, ED
Troponin i, poc: 0.01 ng/mL (ref 0.00–0.08)
Troponin i, poc: 0 ng/mL (ref 0.00–0.08)

## 2016-03-22 LAB — BRAIN NATRIURETIC PEPTIDE: B NATRIURETIC PEPTIDE 5: 81.9 pg/mL (ref 0.0–100.0)

## 2016-03-22 LAB — TSH: TSH: 1.7 u[IU]/mL (ref 0.350–4.500)

## 2016-03-22 LAB — CBG MONITORING, ED: GLUCOSE-CAPILLARY: 75 mg/dL (ref 65–99)

## 2016-03-22 MED ORDER — SODIUM CHLORIDE 0.9% FLUSH: 3.0000 mL | Freq: Two times a day (BID) | INTRAVENOUS | Status: DC

## 2016-03-22 MED ORDER — ENSURE ENLIVE PO LIQD: 237.0000 mL | Freq: Two times a day (BID) | ORAL | Status: DC

## 2016-03-22 MED ORDER — ALBUTEROL SULFATE HFA 108 (90 BASE) MCG/ACT IN AERS: 2.0000 | INHALATION_SPRAY | Freq: Four times a day (QID) | RESPIRATORY_TRACT | Status: DC | PRN

## 2016-03-22 MED ORDER — HEPARIN SODIUM (PORCINE) 5000 UNIT/ML IJ SOLN
5000.0000 [IU] | Freq: Three times a day (TID) | INTRAMUSCULAR | Status: DC
  Filled 2016-03-22 (×2): qty 1

## 2016-03-22 MED ORDER — ESCITALOPRAM OXALATE 10 MG PO TABS
10.0000 mg | ORAL_TABLET | Freq: Every day | ORAL | Status: DC
  Administered 2016-03-22: 10 mg via ORAL
  Filled 2016-03-22: qty 1

## 2016-03-22 MED ORDER — PSYLLIUM 95 % PO PACK
1.0000 | PACK | Freq: Every day | ORAL | Status: DC
  Administered 2016-03-23: 1 via ORAL
  Filled 2016-03-22: qty 1

## 2016-03-22 MED ORDER — LORATADINE 10 MG PO TABS
10.0000 mg | ORAL_TABLET | Freq: Every day | ORAL | Status: DC
  Administered 2016-03-23: 10 mg via ORAL
  Filled 2016-03-22: qty 1

## 2016-03-22 MED ORDER — FLUTICASONE FUROATE-VILANTEROL 200-25 MCG/INH IN AEPB
1.0000 | INHALATION_SPRAY | Freq: Every day | RESPIRATORY_TRACT | Status: DC
  Filled 2016-03-22: qty 28

## 2016-03-22 MED ORDER — HYPROMELLOSE (GONIOSCOPIC) 2.5 % OP SOLN: 1.0000 [drp] | Freq: Three times a day (TID) | OPHTHALMIC | Status: DC | PRN

## 2016-03-22 MED ORDER — LORAZEPAM 0.5 MG PO TABS
0.5000 mg | ORAL_TABLET | Freq: Two times a day (BID) | ORAL | Status: DC | PRN
  Administered 2016-03-23: 0.5 mg via ORAL
  Filled 2016-03-22: qty 1

## 2016-03-22 MED ORDER — ASPIRIN 81 MG PO CHEW
81.0000 mg | CHEWABLE_TABLET | Freq: Every day | ORAL | Status: DC
  Administered 2016-03-23: 81 mg via ORAL
  Filled 2016-03-22: qty 1

## 2016-03-22 MED ORDER — FAMOTIDINE 20 MG PO TABS
20.0000 mg | ORAL_TABLET | Freq: Every day | ORAL | Status: DC
  Administered 2016-03-23: 20 mg via ORAL
  Filled 2016-03-22: qty 1

## 2016-03-22 MED ORDER — SODIUM CHLORIDE 0.9 % IV SOLN
INTRAVENOUS | Status: DC
  Administered 2016-03-22 – 2016-03-23 (×2): via INTRAVENOUS

## 2016-03-22 MED ORDER — LEVOTHYROXINE SODIUM 100 MCG PO TABS
100.0000 ug | ORAL_TABLET | Freq: Every day | ORAL | Status: DC
  Administered 2016-03-23: 100 ug via ORAL
  Filled 2016-03-22: qty 1

## 2016-03-22 MED ORDER — ALBUTEROL SULFATE (2.5 MG/3ML) 0.083% IN NEBU: 2.5000 mg | INHALATION_SOLUTION | Freq: Four times a day (QID) | RESPIRATORY_TRACT | Status: DC | PRN

## 2016-03-22 NOTE — ED Notes (Signed)
[employer] report called to this RN. Dual chamber device, everything has been working properly. Device battery is starting to run low. Hx 3 ventricular high rate episodes, last in May 2017; 5 atrial high rate episodes, last in July 2017

## 2016-03-22 NOTE — ED Notes (Signed)
Pt ambulatory w/ steady gait to restroom. 

## 2016-03-22 NOTE — ED Triage Notes (Signed)
Arrived via EMS patient had a witnessed syncope episode stood up from a sitting position person caught patient assisted patient to the floor.  EMS called and initial BP 86/50 CBG 92. Administered  0.9 NS 500ML bolus. Repeat BP 109/78.  Alert answering and following commands appropriate.

## 2016-03-22 NOTE — H&P (Signed)
History and Physical    [name] [id number] DOB: 1929-07-17 DOA: 03/22/2016  PCP: Walker Kehr, MD  Patient coming from: home  Chief Complaint: syncopal episode   HPI: [name] is a 82 y.o. female with medical history significant of SA nodal dysfunction s/p PPM, HTN, CAD, chronic exertional dyspnea, OSA, dilated ascending aorta, depression, hypothyroidism who presents after a syncopal episode. She states that she was walking, when she felt lightheaded/dizzy. Next thing she knew, she was on the floor. She admits to nausea at this time as well. Otherwise, she did not have any vision loss, vertigo, room spinning sensation, chest pain, shortness of breath, vomiting, diarrhea, abdominal pain, dysuria, peripheral edema. There was someone in her home who was fixing her water heater who came to her aid. He did not witness the event. There is no report of any seizure-like activity or postictal symptoms. She denies any recent stressors, change in medication, change in routine.  ED Course: CT head, chest x-ray obtained which were both unremarkable. Patient's blood pressure and other vital signs have been stable in the ED.  Review of Systems: As per HPI otherwise 10 point review of systems negative.   Past Medical History:  Diagnosis Date  . Anxiety   . Bradycardia   . Depression    Dr Cheryln Manly  . Discoid lupus    skin  . Full dentures   . GERD (gastroesophageal reflux disease)   . Glaucoma   . Hx of echocardiogram    a.  Echocardiogram (03/2011): Mild LVH, EF 0000000, grade 1 diastolic dysfunction, mild MR, mild to moderate TR, PASP 42;  b. Echo (05/2013):  Mod LVH, EF 65-70%, no WMA, Gr 1 DD, mild MR, mod TR, PASP 39  . Hx of exercise stress test    a. ETT-echocardiogram (06/08/11): Sub-optimal exercise. Test stopped early due to dizziness and hypotension. EF 60%.  Non-diagnostic.;   b. Lexiscan Myoview (06/2013):  Diaphragmatic attenuation, no ischemia, EF 61%.  Low Risk  . HX: breast  cancer   . Hypertension   . IBS (irritable bowel syndrome)   . IBS (irritable bowel syndrome)   . Incontinence   . Osteoarthritis   . Osteopenia   . Pacemaker    Brady//Chronotropic incompetence with normal pacemaker function  . Pacemaker   . Thyroid disease   . Wears hearing aid    both ears    Past Surgical History:  Procedure Laterality Date  . ABDOMINAL HYSTERECTOMY    . BREAST LUMPECTOMY  1995   lt -axillary node dissectoin  . CARDIAC CATHETERIZATION N/A 02/13/2016   Procedure: Right Heart Cath;  Surgeon: Larey Dresser, MD;  Location: [facility] [facility];  Service: Cardiovascular;  Laterality: N/A;  . DORSAL COMPARTMENT RELEASE  2000   left  . EYE SURGERY  2011   cataract-lt  . FOOT SURGERY  2004   Left-hammer toes  . KNEE ARTHROSCOPY Left 08/29/2012   Procedure: LEFT KNEE ARTHROSCOPY ;  Surgeon: Hessie Dibble, MD;  Location: [place];  Service: Orthopedics;  Laterality: Left;  [name] MASTECTOMY  2002   bilat mastectomy-snbx  . PACEMAKER PLACEMENT  2009  . TRIGGER FINGER RELEASE Right 07/31/2013   Procedure: EXCISE MASS RIGHT INDEX A-1 PLLLEY RELEASE A-1 RIGHT INDEX;  Surgeon: Cammie Sickle., MD;  Location: [place];  Service: Orthopedics;  Laterality: Right;     reports that she has never smoked. She has never used smokeless tobacco. She reports that she  does not drink alcohol or use drugs.  Allergies  Allergen Reactions  . Lactase Diarrhea  . Amlodipine Besy-Benazepril Hcl Swelling  . Amlodipine Besylate Swelling  . Clonidine Hydrochloride     REACTION: tired  . Lactose Intolerance (Gi) Other (See Comments)  . Valsartan Other (See Comments)    Tongue swelling   . Verapamil     Family History  Problem Relation Age of Onset  . Ovarian cancer Mother   . Heart disease Father   . Stroke Sister   . Stroke Brother   . Cancer Brother   . Heart disease Brother     Prior to Admission medications   Medication Sig Start Date  End Date Taking? Authorizing Provider  albuterol (PROVENTIL HFA;VENTOLIN HFA) 108 (90 Base) MCG/ACT inhaler Inhale 2 puffs into the lungs every 6 (six) hours as needed for wheezing or shortness of breath. 09/04/15  Yes Brand Males, MD  allopurinol (ZYLOPRIM) 300 MG tablet TAKE 1 TABLET BY MOUTH DAILY 03/01/16  Yes Deboraha Sprang, MD  aspirin 81 MG chewable tablet Chew 81 mg by mouth daily.   Yes Historical Provider, MD  cetirizine (ZYRTEC) 10 MG tablet Take 10 mg by mouth daily as needed for allergies.   Yes Historical Provider, MD  escitalopram (LEXAPRO) 10 MG tablet TAKE 1 TABLET BY MOUTH EVERY DAY Patient taking differently: TAKE 1 TABLET (10 MG) BY MOUTH DAILY AT BEDTIME. 01/07/16  Yes Kathlee Nations, MD  fluocinonide cream (LIDEX) [id number] % Apply 1 application topically daily as needed (For spots on face/itching.).  09/11/15  Yes Historical Provider, MD  fluticasone furoate-vilanterol (BREO ELLIPTA) 200-25 MCG/INH AEPB Inhale 1 puff into the lungs daily. 09/04/15  Yes Brand Males, MD  furosemide (LASIX) 20 MG tablet TAKE 1 TABLET (20 MG TOTAL) BY MOUTH DAILY. 03/15/16  Yes Larey Dresser, MD  hydroxypropyl methylcellulose / hypromellose (ISOPTO TEARS / GONIOVISC) 2.5 % ophthalmic solution Place 1-2 drops into both eyes 3 (three) times daily as needed for dry eyes.   Yes Historical Provider, MD  KLOR-CON M20 20 MEQ tablet TAKE 1 TABLET (20 MEQ TOTAL) BY MOUTH DAILY. 03/15/16  Yes Larey Dresser, MD  levothyroxine (SYNTHROID, LEVOTHROID) 100 MCG tablet Take 1 tablet (100 mcg total) by mouth daily. Patient taking differently: Take 100 mcg by mouth daily before breakfast.  08/29/15  Yes Evie Lacks Plotnikov, MD  LORazepam (ATIVAN) 0.5 MG tablet Take 1 tablet (0.5 mg total) by mouth 2 (two) times daily as needed (for anxiety.). 03/15/16  Yes Evie Lacks Plotnikov, MD  oxybutynin (DITROPAN) 5 MG tablet Take 1 tablet (5 mg total) by mouth 3 (three) times daily. Patient taking differently: Take 5 mg by  mouth 2 (two) times daily.  12/15/15  Yes Evie Lacks Plotnikov, MD  pindolol (VISKEN) 5 MG tablet TAKE 1 TABLET BY MOUTH DAILY 12/08/15  Yes Evie Lacks Plotnikov, MD  psyllium (METAMUCIL SMOOTH TEXTURE) 28 % packet Take 1 packet by mouth daily with breakfast.   Yes Historical Provider, MD  ranitidine (ZANTAC) 150 MG tablet Take 150 mg by mouth 3 (three) times daily. 01/01/16  Yes Historical Provider, MD  spironolactone (ALDACTONE) 25 MG tablet Take 1 tablet (25 mg total) by mouth daily. 10/27/15  Yes Cassandria Anger, MD    Physical Exam: Vitals:   03/22/16 1330 03/22/16 1430 03/22/16 1500 03/22/16 1615  BP: 120/68 112/74 121/69 116/85  Pulse: 60 68 66 64  Resp: 19 14 20 19   SpO2: 100% 100% 100% 99%  Weight:      Height:         Constitutional: NAD, calm, comfortable Eyes: PERRL, lids and conjunctivae normal ENMT: Mucous membranes are dry. Posterior pharynx clear of any exudate or lesions.Normal dentition.  Neck: normal, supple, no masses, no thyromegaly Respiratory: clear to auscultation bilaterally, no wheezing, no crackles. Normal respiratory effort. No accessory muscle use.  Cardiovascular: Regular irregular rhythm, rate 60s, no murmurs / rubs / gallops. No extremity edema. 2+ pedal pulses.  Abdomen: no tenderness, no masses palpated. No hepatosplenomegaly. Bowel sounds positive.  Musculoskeletal: no clubbing / cyanosis. No joint deformity upper and lower extremities. Good ROM, no contractures. Normal muscle tone.  Skin: no rashes, lesions, ulcers. No induration Neurologic: CN 2-12 grossly intact. Sensation intact. Strength 5/5 in all 4.  Psychiatric: Normal judgment and insight. Alert and oriented x 3. Normal mood.   Labs on Admission: I have personally reviewed following labs and imaging studies  CBC:  Recent Labs Lab 03/22/16 1230  WBC 3.3*  HGB 12.6  HCT 38.4  MCV 99.5  PLT 0000000   Basic Metabolic Panel:  Recent Labs Lab 03/22/16 1230  NA 138  K 4.3  CL 108  CO2  24  GLUCOSE 81  BUN 16  CREATININE 1.10*  CALCIUM 8.7*   GFR: Estimated Creatinine Clearance: 36.7 mL/min (by C-G formula based on SCr of 1.1 mg/dL (H)). Liver Function Tests: No results for input(s): AST, ALT, ALKPHOS, BILITOT, PROT, ALBUMIN in the last 168 hours. No results for input(s): LIPASE, AMYLASE in the last 168 hours. No results for input(s): AMMONIA in the last 168 hours. Coagulation Profile: No results for input(s): INR, PROTIME in the last 168 hours. Cardiac Enzymes: No results for input(s): CKTOTAL, CKMB, CKMBINDEX, TROPONINI in the last 168 hours. BNP (last 3 results) No results for input(s): PROBNP in the last 8760 hours. HbA1C: No results for input(s): HGBA1C in the last 72 hours. CBG:  Recent Labs Lab 03/22/16 1231  GLUCAP 75   Lipid Profile: No results for input(s): CHOL, HDL, LDLCALC, TRIG, CHOLHDL, LDLDIRECT in the last 72 hours. Thyroid Function Tests:  Recent Labs  03/22/16 1300  TSH 1.700   Anemia Panel: No results for input(s): VITAMINB12, FOLATE, FERRITIN, TIBC, IRON, RETICCTPCT in the last 72 hours. Urine analysis:    Component Value Date/Time   COLORURINE YELLOW 03/22/2016 [place] 03/22/2016 1257   LABSPEC 1.010 03/22/2016 1257   PHURINE 7.5 03/22/2016 1257   GLUCOSEU NEGATIVE 03/22/2016 1257   GLUCOSEU NEGATIVE 10/10/2015 1642   HGBUR NEGATIVE 03/22/2016 1257   BILIRUBINUR NEGATIVE 03/22/2016 1257   KETONESUR NEGATIVE 03/22/2016 1257   PROTEINUR NEGATIVE 03/22/2016 1257   UROBILINOGEN 0.2 10/10/2015 1642   NITRITE NEGATIVE 03/22/2016 1257   LEUKOCYTESUR TRACE (A) 03/22/2016 1257   Sepsis Labs: !!!!!!!!!!!!!!!!!!!!!!!!!!!!!!!!!!!!!!!!!!!! @LABRCNTIP (procalcitonin:4,lacticidven:4) )No results found for this or any previous visit (from the past 240 hour(s)).   Radiological Exams on Admission: Dg Chest 2 View  Result Date: 03/22/2016 CLINICAL DATA:  Syncope and nausea with cough and congestion. History of breast  carcinoma EXAM: CHEST  2 VIEW COMPARISON:  Chest radiograph February 13, 2015 and chest CT Sep 10, 2015 FINDINGS: There is no edema or consolidation. Heart size and pulmonary vascularity are normal. There is atherosclerotic calcification in the aorta. Pacemaker leads are attached to the right atrium and right ventricle. No adenopathy. No evident bone lesions. There are surgical clips in the left axillary region. IMPRESSION: No edema or consolidation. Aortic atherosclerosis. Pacemaker leads  are attached to the right atrium and right ventricle. Electronically Signed   By: Lowella Grip III M.D.   On: 03/22/2016 14:16   Ct Head Wo Contrast  Result Date: 03/22/2016 CLINICAL DATA:  Dizziness, syncopal episode. History of breast cancer, atrial fibrillation, hypertension. EXAM: CT HEAD WITHOUT CONTRAST TECHNIQUE: Contiguous axial images were obtained from the base of the skull through the vertex without intravenous contrast. COMPARISON:  CT HEAD May 28, 2012 FINDINGS: BRAIN: The ventricles and sulci are normal for age. No intraparenchymal hemorrhage, mass effect nor midline shift. Patchy supratentorial white matter hypodensities less than expected for patient's age, though non-specific are most compatible with chronic small vessel ischemic disease. No acute large vascular territory infarcts. No abnormal extra-axial fluid collections. Basal cisterns are patent. VASCULAR: Mild calcific atherosclerosis of the carotid siphons. SKULL: No skull fracture. No significant scalp soft tissue swelling. SINUSES/ORBITS: The mastoid air-cells and included paranasal sinuses are well-aerated.The included ocular globes and orbital contents are non-suspicious. OTHER: None. IMPRESSION: No acute intracranial process; stable negative CT HEAD for age. Electronically Signed   By: Elon Alas M.D.   On: 03/22/2016 13:56    EKG: Independently reviewed. Sinus with PAC, left axis deviation.   Assessment/Plan Principal  Problem:   Syncope Active Problems:   Hypothyroidism   Sinoatrial node dysfunction (HCC)   GERD   Pacemaker-[employer]tr[employer]   Benign essential HTN   CAD (coronary artery disease)   CKD (chronic kidney disease) stage 3, GFR 30-59 ml/min   Depression   Syncope -Check orthostatic vital signs -Continue cardiac monitor -Pacemaker being interrogated, per ED  -Hold antihypertensive, as well as Ditropan -IVF  -Check echo to r/o structural heart disease, valve dysfunction   Essential HTN -Hold Lasix, pindolol, Aldactone in setting of syncopal episode   CAD -Continue aspirin 81 mg   Chronic kidney disease stage III -Baseline creatinine around 0.9-1 -Stable, continue to monitor  Depression -Continue Lexapro, Ativan  Hypothyroidism -Continue Synthroid -TSH within normal  GERD -Continue Zantac or pepcid   DVT prophylaxis: subq hep Code Status: DO NOT RESUSCITATE  Family Communication: Family/friends at bedside Disposition Plan: Pending further workup, suspect discharge back home Consults called: none  Admission status: observation  It is my clinical opinion that referral for OBSERVATION is reasonable and necessary in this 82 y.o. year old female  presenting with symptoms of syncopal episode, concerning for cardiac etiology  in the context of PMH including SA node dysfunction s/p pacemaker, HTN, CAD, hypothyroidism  with pertinent physical exam findings of hypotension per EMS report as well as dry mucosal membrane  The aforementioned taken together are felt to place the patient at high risk for further  clinical deterioration. However it is anticipated that the patient may be medically stable for discharge from the hospital within 24 to 48 hours.    Dessa Phi, DO [facility] [email or web address] Password [email or web address] 03/22/2016, 5:06 PM

## 2016-03-22 NOTE — ED Provider Notes (Addendum)
[facility] DEPT Provider Note   CSN: [id number] Arrival date & time: 03/22/16  1213     History   Chief Complaint Chief Complaint  Patient presents with  . Loss of Consciousness  . Hypotension    HPI [name] is a 82 y.o. female.   Loss of Consciousness   This is a new problem. The current episode started 1 to 2 hours ago. The problem occurs rarely. The problem has been resolved. She lost consciousness for a period of 1 to 5 minutes. The problem is associated with normal activity. Associated symptoms include light-headedness and nausea. Pertinent negatives include abdominal pain, back pain, bladder incontinence, chest pain, diaphoresis, dizziness, fever, focal weakness, headaches, palpitations, seizures, slurred speech, visual change and vomiting. She has tried nothing for the symptoms. The treatment provided significant relief.    Past Medical History:  Diagnosis Date  . Anxiety   . Bradycardia   . Depression    Dr Cheryln Manly  . Discoid lupus    skin  . Full dentures   . GERD (gastroesophageal reflux disease)   . Glaucoma   . Hx of echocardiogram    a.  Echocardiogram (03/2011): Mild LVH, EF 0000000, grade 1 diastolic dysfunction, mild MR, mild to moderate TR, PASP 42;  b. Echo (05/2013):  Mod LVH, EF 65-70%, no WMA, Gr 1 DD, mild MR, mod TR, PASP 39  . Hx of exercise stress test    a. ETT-echocardiogram (06/08/11): Sub-optimal exercise. Test stopped early due to dizziness and hypotension. EF 60%.  Non-diagnostic.;   b. Lexiscan Myoview (06/2013):  Diaphragmatic attenuation, no ischemia, EF 61%.  Low Risk  . HX: breast cancer   . Hypertension   . IBS (irritable bowel syndrome)   . IBS (irritable bowel syndrome)   . Incontinence   . Osteoarthritis   . Osteopenia   . Pacemaker    Brady//Chronotropic incompetence with normal pacemaker function  . Pacemaker   . Thyroid disease   . Wears hearing aid    both ears    Patient Active Problem List   Diagnosis Date  Noted  . Syncope 03/22/2016  . Exertional dyspnea 11/04/2015  . Ascending aortic aneurysm ([place]) 11/04/2015  . 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 Headache 10/10/2015  . Arthralgia 10/10/2015  . Pulmonary hypertension 09/30/2015  . Mild persistent asthma 09/30/2015  . Chronic cough 09/04/2015  . Irritable larynx 09/04/2015  . Hemoptysis 09/04/2015  . Rotator cuff disorder 03/06/2015  . Hematochezia 10/22/2014  . OCD (obsessive compulsive disorder) 04/03/2013  . IBS (irritable bowel syndrome) 07/03/2012  . Dysphagia 04/13/2012  . Diarrhea, infectious, adult 04/10/2012  . Denture irritation 12/20/2011  . Irritable bladder 12/20/2011  . Knee pain, bilateral 12/20/2011  . Hearing loss 10/11/2011  . Pacemaker-[employer]tr[employer] 05/18/2011  . DOE (dyspnea on exertion) 04/13/2011  . Conjunctivitis 10/20/2010  . Gout, unspecified 04/30/2010  . Ventricular tachycardia-nonsustained 03/05/2010  . Anal fissure 02/03/2010  . Atrial fibrillation ([place]) 08/26/2009  . Cramp of limb 07/15/2009  . HYPOKALEMIA 04/22/2009  . HYPERGLYCEMIA 01/13/2009  . Sinoatrial node dysfunction ([place]) 09/17/2008  . HYPOTENSION, ORTHOSTATIC 01/29/2008  . CYSTITIS 01/29/2008  . SYNCOPE 10/04/2007  . Microscopic hematuria 08/21/2007  . Rash and other nonspecific skin eruption 08/21/2007  . Pain in joint, pelvic region and thigh 08/01/2007  . CELLULITIS, FINGER 06/30/2007  . VITAMIN D DEFICIENCY 05/23/2007  . Anxiety state 05/23/2007  . Hypothyroidism 03/22/2007  . GERD 03/22/2007  . Osteoarthritis 03/22/2007  . FATIGUE 03/22/2007  . CFS (chronic fatigue  syndrome) 11/17/2006  . Hypertensive heart disease 11/17/2006  . ALLERGIC RHINITIS 11/17/2006  . OSTEOPOROSIS 11/17/2006  . BREAST CANCER, HX OF 11/17/2006    Past Surgical History:  Procedure Laterality Date  . ABDOMINAL HYSTERECTOMY    . BREAST LUMPECTOMY  1995   lt -axillary node dissectoin  . CARDIAC CATHETERIZATION N/A 02/13/2016   Procedure: Right Heart Cath;   Surgeon: Larey Dresser, MD;  Location: [facility];  Service: Cardiovascular;  Laterality: N/A;  . DORSAL COMPARTMENT RELEASE  2000   left  . EYE SURGERY  2011   cataract-lt  . FOOT SURGERY  2004   Left-hammer toes  . KNEE ARTHROSCOPY Left 08/29/2012   Procedure: LEFT KNEE ARTHROSCOPY ;  Surgeon: Hessie Dibble, MD;  Location: [facility];  Service: Orthopedics;  Laterality: Left;  [name] MASTECTOMY  2002   bilat mastectomy-snbx  . PACEMAKER PLACEMENT  2009  . TRIGGER FINGER RELEASE Right 07/31/2013   Procedure: EXCISE MASS RIGHT INDEX A-1 PLLLEY RELEASE A-1 RIGHT INDEX;  Surgeon: Cammie Sickle., MD;  Location: [facility];  Service: Orthopedics;  Laterality: Right;    OB History    No data available       Home Medications    Prior to Admission medications   Medication Sig Start Date End Date Taking? Authorizing Provider  albuterol (PROVENTIL HFA;VENTOLIN HFA) 108 (90 Base) MCG/ACT inhaler Inhale 2 puffs into the lungs every 6 (six) hours as needed for wheezing or shortness of breath. 09/04/15  Yes Brand Males, MD  allopurinol (ZYLOPRIM) 300 MG tablet TAKE 1 TABLET BY MOUTH DAILY 03/01/16  Yes Deboraha Sprang, MD  aspirin 81 MG chewable tablet Chew 81 mg by mouth daily.   Yes Historical Provider, MD  cetirizine (ZYRTEC) 10 MG tablet Take 10 mg by mouth daily as needed for allergies.   Yes Historical Provider, MD  escitalopram (LEXAPRO) 10 MG tablet TAKE 1 TABLET BY MOUTH EVERY DAY Patient taking differently: TAKE 1 TABLET (10 MG) BY MOUTH DAILY AT BEDTIME. 01/07/16  Yes Kathlee Nations, MD  fluocinonide cream (LIDEX) [id number] % Apply 1 application topically daily as needed (For spots on face/itching.).  09/11/15  Yes Historical Provider, MD  fluticasone furoate-vilanterol (BREO ELLIPTA) 200-25 MCG/INH AEPB Inhale 1 puff into the lungs daily. 09/04/15  Yes Brand Males, MD  furosemide (LASIX) 20 MG tablet TAKE 1 TABLET (20 MG TOTAL) BY MOUTH DAILY.  03/15/16  Yes Larey Dresser, MD  hydroxypropyl methylcellulose / hypromellose (ISOPTO TEARS / GONIOVISC) 2.5 % ophthalmic solution Place 1-2 drops into both eyes 3 (three) times daily as needed for dry eyes.   Yes Historical Provider, MD  KLOR-CON M20 20 MEQ tablet TAKE 1 TABLET (20 MEQ TOTAL) BY MOUTH DAILY. 03/15/16  Yes Larey Dresser, MD  levothyroxine (SYNTHROID, LEVOTHROID) 100 MCG tablet Take 1 tablet (100 mcg total) by mouth daily. Patient taking differently: Take 100 mcg by mouth daily before breakfast.  08/29/15  Yes Evie Lacks Plotnikov, MD  LORazepam (ATIVAN) 0.5 MG tablet Take 1 tablet (0.5 mg total) by mouth 2 (two) times daily as needed (for anxiety.). 03/15/16  Yes Evie Lacks Plotnikov, MD  oxybutynin (DITROPAN) 5 MG tablet Take 1 tablet (5 mg total) by mouth 3 (three) times daily. Patient taking differently: Take 5 mg by mouth 2 (two) times daily.  12/15/15  Yes Evie Lacks Plotnikov, MD  pindolol (VISKEN) 5 MG tablet TAKE 1 TABLET BY MOUTH DAILY 12/08/15  Yes Evie Lacks Plotnikov, MD  psyllium (METAMUCIL SMOOTH TEXTURE) 28 % packet Take 1 packet by mouth daily with breakfast.   Yes Historical Provider, MD  ranitidine (ZANTAC) 150 MG tablet Take 150 mg by mouth 3 (three) times daily. 01/01/16  Yes Historical Provider, MD  spironolactone (ALDACTONE) 25 MG tablet Take 1 tablet (25 mg total) by mouth daily. 10/27/15  Yes Cassandria Anger, MD    Family History Family History  Problem Relation Age of Onset  . Ovarian cancer Mother   . Heart disease Father   . Stroke Sister   . Stroke Brother   . Cancer Brother   . Heart disease Brother     Social History Social History  Substance Use Topics  . Smoking status: Never Smoker  . Smokeless tobacco: Never Used  . Alcohol use No     Allergies   Lactase; Amlodipine besy-benazepril hcl; Amlodipine besylate; Clonidine hydrochloride; Lactose intolerance (gi); Valsartan; and Verapamil   Review of Systems Review of Systems    Constitutional: Negative for chills, diaphoresis and fever.  HENT: Negative for ear pain and sore throat.   Eyes: Negative for pain and visual disturbance.  Respiratory: Negative for cough and shortness of breath.   Cardiovascular: Positive for syncope. Negative for chest pain and palpitations.  Gastrointestinal: Positive for nausea. Negative for abdominal pain and vomiting.  Genitourinary: Negative for bladder incontinence, dysuria and hematuria.  Musculoskeletal: Negative for arthralgias and back pain.  Skin: Negative for color change and rash.  Neurological: Positive for light-headedness. Negative for dizziness, focal weakness, seizures, syncope and headaches.  All other systems reviewed and are negative.    Physical Exam Updated Vital Signs BP 116/85   Pulse 64   Resp 19   Ht 5\' 3"  (1.6 m)   Wt 73.9 kg   SpO2 99%   BMI 28.87 kg/m   Physical Exam  Constitutional: She is oriented to person, place, and time. She appears well-developed and well-nourished. No distress.  HENT:  Head: Normocephalic and atraumatic.  Eyes: Conjunctivae are normal.  Neck: Neck supple.  Cardiovascular: Normal rate and regular rhythm.   No murmur heard. Pulmonary/Chest: Effort normal and breath sounds normal. No respiratory distress.  Abdominal: Soft. Bowel sounds are normal. She exhibits no distension. There is no tenderness. There is no guarding.  Musculoskeletal: She exhibits no edema.  Neurological: She is alert and oriented to person, place, and time. No cranial nerve deficit or sensory deficit. She exhibits normal muscle tone. Coordination normal.  Skin: Skin is warm and dry.  Psychiatric: She has a normal mood and affect.  Nursing note and vitals reviewed.    ED Treatments / Results  Labs (all labs ordered are listed, but only abnormal results are displayed) Labs Reviewed  BASIC METABOLIC PANEL - Abnormal; Notable for the following:       Result Value   Creatinine, Ser 1.10 (*)     Calcium 8.7 (*)    GFR calc non Af Amer 45 (*)    GFR calc Af Amer 52 (*)    All other components within normal limits  CBC - Abnormal; Notable for the following:    WBC 3.3 (*)    RBC 3.86 (*)    All other components within normal limits  URINALYSIS, ROUTINE W REFLEX MICROSCOPIC (NOT AT [facility]) - Abnormal; Notable for the following:    Leukocytes, UA TRACE (*)    All other components within normal limits  URINE MICROSCOPIC-ADD ON - Abnormal; Notable for  the following:    Squamous Epithelial / LPF 0-5 (*)    Bacteria, UA RARE (*)    Casts HYALINE CASTS (*)    All other components within normal limits  BRAIN NATRIURETIC PEPTIDE  TSH  CBG MONITORING, ED  I-STAT TROPOININ, ED  I-STAT TROPOININ, ED    EKG  EKG Interpretation  Date/Time:  Monday March 22 2016 12:16:34 EST Ventricular Rate:  61 PR Interval:    QRS Duration: 84 QT Interval:  430 QTC Calculation: 434 R Axis:   -47 Text Interpretation:  Sinus or ectopic atrial rhythm Atrial premature complexes Left anterior fascicular block Borderline T abnormalities, lateral leads Slightly Flattened T waves in lateral leads when compared to prior No STEMI Confirmed by Sherry Ruffing MD, [place] Gave [id number]) on 03/22/2016 12:24:07 PM Also confirmed by Sherry Ruffing MD, [place] [phone]), editor Lorenda Cahill CT, Leda Gauze [phone])  on 03/22/2016 1:21:22 PM       Radiology Dg Chest 2 View  Result Date: 03/22/2016 CLINICAL DATA:  Syncope and nausea with cough and congestion. History of breast carcinoma EXAM: CHEST  2 VIEW COMPARISON:  Chest radiograph February 13, 2015 and chest CT Sep 10, 2015 FINDINGS: There is no edema or consolidation. Heart size and pulmonary vascularity are normal. There is atherosclerotic calcification in the aorta. Pacemaker leads are attached to the right atrium and right ventricle. No adenopathy. No evident bone lesions. There are surgical clips in the left axillary region. IMPRESSION: No edema or consolidation. Aortic  atherosclerosis. Pacemaker leads are attached to the right atrium and right ventricle. Electronically Signed   By: Lowella Grip III M.D.   On: 03/22/2016 14:16   Ct Head Wo Contrast  Result Date: 03/22/2016 CLINICAL DATA:  Dizziness, syncopal episode. History of breast cancer, atrial fibrillation, hypertension. EXAM: CT HEAD WITHOUT CONTRAST TECHNIQUE: Contiguous axial images were obtained from the base of the skull through the vertex without intravenous contrast. COMPARISON:  CT HEAD May 28, 2012 FINDINGS: BRAIN: The ventricles and sulci are normal for age. No intraparenchymal hemorrhage, mass effect nor midline shift. Patchy supratentorial white matter hypodensities less than expected for patient's age, though non-specific are most compatible with chronic small vessel ischemic disease. No acute large vascular territory infarcts. No abnormal extra-axial fluid collections. Basal cisterns are patent. VASCULAR: Mild calcific atherosclerosis of the carotid siphons. SKULL: No skull fracture. No significant scalp soft tissue swelling. SINUSES/ORBITS: The mastoid air-cells and included paranasal sinuses are well-aerated.The included ocular globes and orbital contents are non-suspicious. OTHER: None. IMPRESSION: No acute intracranial process; stable negative CT HEAD for age. Electronically Signed   By: Elon Alas M.D.   On: 03/22/2016 13:56    Procedures Procedures (including critical care time)  Medications Ordered in ED Medications - No data to display   Initial Impression / Assessment and Plan / ED Course  I have reviewed the triage vital signs and the nursing notes.  Pertinent labs & imaging results that were available during my care of the patient were reviewed by me and considered in my medical decision making (see chart for details).  Clinical Course     82 year old female consisted after an unwitnessed syncopal episode. She remembers feeling lightheaded and nauseous beyond  that Celexa and she remembers being helped to her feet from her knees by a worker at her house. She does not recall she hit her head. CT of her head is without any acute intracranial abnormalities. EKG shows paced rhythm no acute signs of ischemia. No elevated troponins. No signs of heart  failure on exam or laboratory testing. Normal kidney function and electrolytes. Urinalysis is without signs of infection. This patient does have a history of right heart dysfunction and was recently cathed and had an echo however she needs to come in after pacemaker interrogation and further provocative testing, possibly involving a echocardiogram or stress test. The hospitalist team will admit this patient further observation. For the remainder of this patient's care please see inpatient team notes. Of note there is no neurologic dysfunction detected on exam.  Final Clinical Impressions(s) / ED Diagnoses   Final diagnoses:  Syncope and collapse    New Prescriptions New Prescriptions   No medications on file     Dewaine Conger, MD 03/22/16 [place], MD 03/22/16 2046    Dewaine Conger, MD 04/05/16 1600

## 2016-03-22 NOTE — ED Notes (Signed)
Attempted report to floor x 1 

## 2016-03-23 ENCOUNTER — Encounter: Payer: Self-pay | Admitting: Internal Medicine

## 2016-03-23 ENCOUNTER — Observation Stay (HOSPITAL_BASED_OUTPATIENT_CLINIC_OR_DEPARTMENT_OTHER): Payer: Medicare Other

## 2016-03-23 DIAGNOSIS — I2510 Atherosclerotic heart disease of native coronary artery without angina pectoris: Secondary | ICD-10-CM | POA: Diagnosis not present

## 2016-03-23 DIAGNOSIS — R55 Syncope and collapse: Secondary | ICD-10-CM

## 2016-03-23 DIAGNOSIS — I495 Sick sinus syndrome: Secondary | ICD-10-CM

## 2016-03-23 DIAGNOSIS — Z950 Presence of cardiac pacemaker: Secondary | ICD-10-CM

## 2016-03-23 DIAGNOSIS — K210 Gastro-esophageal reflux disease with esophagitis: Secondary | ICD-10-CM

## 2016-03-23 DIAGNOSIS — I2583 Coronary atherosclerosis due to lipid rich plaque: Secondary | ICD-10-CM

## 2016-03-23 LAB — CBC
HEMATOCRIT: 38.9 % (ref 36.0–46.0)
Hemoglobin: 13.1 g/dL (ref 12.0–15.0)
MCH: 33.2 pg (ref 26.0–34.0)
MCHC: 33.7 g/dL (ref 30.0–36.0)
MCV: 98.5 fL (ref 78.0–100.0)
PLATELETS: 166 10*3/uL (ref 150–400)
RBC: 3.95 MIL/uL (ref 3.87–5.11)
RDW: 13.8 % (ref 11.5–15.5)
WBC: 2.8 10*3/uL — AB (ref 4.0–10.5)

## 2016-03-23 LAB — ECHOCARDIOGRAM COMPLETE
Height: 63 in
Weight: 2608 oz

## 2016-03-23 LAB — T4, FREE: FREE T4: 1.11 ng/dL (ref 0.61–1.12)

## 2016-03-23 LAB — BASIC METABOLIC PANEL
Anion gap: 10 (ref 5–15)
BUN: 17 mg/dL (ref 6–20)
CHLORIDE: 104 mmol/L (ref 101–111)
CO2: 23 mmol/L (ref 22–32)
CREATININE: 0.94 mg/dL (ref 0.44–1.00)
Calcium: 8.9 mg/dL (ref 8.9–10.3)
GFR calc non Af Amer: 54 mL/min — ABNORMAL LOW (ref >60)
Glucose, Bld: 77 mg/dL (ref 65–99)
POTASSIUM: 3.8 mmol/L (ref 3.5–5.1)
Sodium: 137 mmol/L (ref 135–145)

## 2016-03-23 MED ORDER — ENSURE ENLIVE PO LIQD: 237.0000 mL | Freq: Two times a day (BID) | ORAL | 12 refills | Status: DC

## 2016-03-23 NOTE — Care Management Obs Status (Signed)
[facility] NOTIFICATION   Patient Details  Name: [name] MRN: [id number] Date of Birth: 12-28-1929   Medicare Observation Status Notification Given:  Yes    Dawayne Patricia, RN 03/23/2016, 11:43 AM

## 2016-03-23 NOTE — Progress Notes (Signed)
  Echocardiogram 2D Echocardiogram has been performed.  [name] 03/23/2016, 1:13 PM

## 2016-03-23 NOTE — Discharge Summary (Signed)
Physician Discharge Summary  [name] MRN: [id number] DOB/AGE: 1929/11/25 82 y.o.  PCP: Walker Kehr, MD   Admit date: 03/22/2016 Discharge date: 03/23/2016  Discharge Diagnoses:   Principal Problem:   Syncope Active Problems:   Hypothyroidism   Sinoatrial node dysfunction (HCC)   GERD   Pacemaker-[employer]   Benign essential HTN   CAD (coronary artery disease)   CKD (chronic kidney disease) stage 3, GFR 30-59 ml/min   Depression    Follow-up recommendations Follow-up with PCP in 3-5 days , including all  additional recommended appointments as below Follow-up CBC, CMP in 3-5 days       Current Discharge Medication List    START taking these medications   Details  feeding supplement, ENSURE ENLIVE, (ENSURE ENLIVE) LIQD Take 237 mLs by mouth 2 (two) times daily between meals. Qty: 237 mL, Refills: 12      CONTINUE these medications which have NOT CHANGED   Details  albuterol (PROVENTIL HFA;VENTOLIN HFA) 108 (90 Base) MCG/ACT inhaler Inhale 2 puffs into the lungs every 6 (six) hours as needed for wheezing or shortness of breath. Qty: 1 Inhaler, Refills: 6    allopurinol (ZYLOPRIM) 300 MG tablet TAKE 1 TABLET BY MOUTH DAILY Qty: 30 tablet, Refills: 1    aspirin 81 MG chewable tablet Chew 81 mg by mouth daily.    cetirizine (ZYRTEC) 10 MG tablet Take 10 mg by mouth daily as needed for allergies.    escitalopram (LEXAPRO) 10 MG tablet TAKE 1 TABLET BY MOUTH EVERY DAY Qty: 90 tablet, Refills: 0   Associated Diagnoses: Major depressive disorder, recurrent episode, mild (HCC)    fluocinonide cream (LIDEX) 7.00 % Apply 1 application topically daily as needed (For spots on face/itching.).     fluticasone furoate-vilanterol (BREO ELLIPTA) 200-25 MCG/INH AEPB Inhale 1 puff into the lungs daily. Qty: 60 each, Refills: 5    hydroxypropyl methylcellulose / hypromellose (ISOPTO TEARS / GONIOVISC) 2.5 % ophthalmic solution Place 1-2 drops into both eyes 3 (three)  times daily as needed for dry eyes.    KLOR-CON M20 20 MEQ tablet TAKE 1 TABLET (20 MEQ TOTAL) BY MOUTH DAILY. Qty: 30 tablet, Refills: 3    levothyroxine (SYNTHROID, LEVOTHROID) 100 MCG tablet Take 1 tablet (100 mcg total) by mouth daily. Qty: 90 tablet, Refills: 3    LORazepam (ATIVAN) 0.5 MG tablet Take 1 tablet (0.5 mg total) by mouth 2 (two) times daily as needed (for anxiety.). Qty: 60 tablet, Refills: 3   Associated Diagnoses: Major depressive disorder, recurrent episode, mild (HCC)    pindolol (VISKEN) 5 MG tablet TAKE 1 TABLET BY MOUTH DAILY Qty: 90 tablet, Refills: 1    psyllium (METAMUCIL SMOOTH TEXTURE) 28 % packet Take 1 packet by mouth daily with breakfast.    ranitidine (ZANTAC) 150 MG tablet Take 150 mg by mouth 3 (three) times daily.    spironolactone (ALDACTONE) 25 MG tablet Take 1 tablet (25 mg total) by mouth daily. Qty: 90 tablet, Refills: 1      STOP taking these medications     furosemide (LASIX) 20 MG tablet      oxybutynin (DITROPAN) 5 MG tablet          Discharge Condition: stable   Discharge Instructions Get Medicines reviewed and adjusted: Please take all your medications with you for your next visit with your Primary MD  Please request your Primary MD to go over all hospital tests and procedure/radiological results at the follow up, please ask your Primary MD to  get all Hospital records sent to his/her office.  If you experience worsening of your admission symptoms, develop shortness of breath, life threatening emergency, suicidal or homicidal thoughts you must seek medical attention immediately by calling 911 or calling your MD immediately if symptoms less severe.  You must read complete instructions/literature along with all the possible adverse reactions/side effects for all the Medicines you take and that have been prescribed to you. Take any new Medicines after you have completely understood and accpet all the possible adverse  reactions/side effects.   Do not drive when taking Pain medications.   Do not take more than prescribed Pain, Sleep and Anxiety Medications  Special Instructions: If you have smoked or chewed Tobacco in the last 2 yrs please stop smoking, stop any regular Alcohol and or any Recreational drug use.  Wear Seat belts while driving.  Please note  You were cared for by a hospitalist during your hospital stay. Once you are discharged, your primary care physician will handle any further medical issues. Please note that NO REFILLS for any discharge medications will be authorized once you are discharged, as it is imperative that you return to your primary care physician (or establish a relationship with a primary care physician if you do not have one) for your aftercare needs so that they can reassess your need for medications and monitor your lab values.  Discharge Instructions    Diet - low sodium heart healthy    Complete by:  As directed    Increase activity slowly    Complete by:  As directed        Allergies  Allergen Reactions  . Lactase Diarrhea  . Amlodipine Besy-Benazepril Hcl Swelling  . Amlodipine Besylate Swelling  . Clonidine Hydrochloride     REACTION: tired  . Lactose Intolerance (Gi) Other (See Comments)  . Valsartan Other (See Comments)    Tongue swelling   . Verapamil       Disposition: 01-Home or Self Care   Consults:  cardiology   Significant Diagnostic Studies:  Dg Chest 2 View  Result Date: 03/22/2016 CLINICAL DATA:  Syncope and nausea with cough and congestion. History of breast carcinoma EXAM: CHEST  2 VIEW COMPARISON:  Chest radiograph February 13, 2015 and chest CT Sep 10, 2015 FINDINGS: There is no edema or consolidation. Heart size and pulmonary vascularity are normal. There is atherosclerotic calcification in the aorta. Pacemaker leads are attached to the right atrium and right ventricle. No adenopathy. No evident bone lesions. There are surgical  clips in the left axillary region. IMPRESSION: No edema or consolidation. Aortic atherosclerosis. Pacemaker leads are attached to the right atrium and right ventricle. Electronically Signed   By: Lowella Grip III M.D.   On: 03/22/2016 14:16   Ct Head Wo Contrast  Result Date: 03/22/2016 CLINICAL DATA:  Dizziness, syncopal episode. History of breast cancer, atrial fibrillation, hypertension. EXAM: CT HEAD WITHOUT CONTRAST TECHNIQUE: Contiguous axial images were obtained from the base of the skull through the vertex without intravenous contrast. COMPARISON:  CT HEAD May 28, 2012 FINDINGS: BRAIN: The ventricles and sulci are normal for age. No intraparenchymal hemorrhage, mass effect nor midline shift. Patchy supratentorial white matter hypodensities less than expected for patient's age, though non-specific are most compatible with chronic small vessel ischemic disease. No acute large vascular territory infarcts. No abnormal extra-axial fluid collections. Basal cisterns are patent. VASCULAR: Mild calcific atherosclerosis of the carotid siphons. SKULL: No skull fracture. No significant scalp soft tissue swelling. SINUSES/ORBITS:  The mastoid air-cells and included paranasal sinuses are well-aerated.The included ocular globes and orbital contents are non-suspicious. OTHER: None. IMPRESSION: No acute intracranial process; stable negative CT HEAD for age. Electronically Signed   By: Elon Alas M.D.   On: 03/22/2016 13:56    echocardiogram  LV EF: 55%  ------------------------------------------------------------------- Indications:      Syncope 780.2.  ------------------------------------------------------------------- History:   PMH:  Hypothyroidism. Pacemaker. Obstructive sleep apnea (on CPAP). Bilateral breast cancer. Bradycardia.  Congestive heart failure.  Risk factors:  Hypertension.  ------------------------------------------------------------------- Study Conclusions  - Left  ventricle: The cavity size was normal. There was moderate   focal basal hypertrophy. Systolic function was normal. The   estimated ejection fraction was 55%. Wall motion was normal;   there were no regional wall motion abnormalities. There was an   increased relative contribution of atrial contraction to   ventricular filling. Doppler parameters are consistent with   abnormal left ventricular relaxation (grade 1 diastolic   dysfunction). - Mitral valve: Mild, late systolicprolapse, involving the   posterior leaflet. There was trivial regurgitation. - Tricuspid valve: There was mild regurgitation. - Pulmonary arteries: PA peak pressure: 31 mm Hg (S).   Filed Weights   03/22/16 1216  Weight: 73.9 kg (163 lb)     Microbiology: No results found for this or any previous visit (from the past 240 hour(s)).     Blood Culture    Component Value Date/Time   SDES URINE, RANDOM 03/31/2012 1908   SPECREQUEST NONE 03/31/2012 1908   CULT  03/31/2012 1908    Multiple bacterial morphotypes present, none predominant. Suggest appropriate recollection if clinically indicated.   REPTSTATUS 04/02/2012 FINAL 03/31/2012 1908      Labs: Results for orders placed or performed during the hospital encounter of 03/22/16 (from the past 48 hour(s))  Basic metabolic panel     Status: Abnormal   Collection Time: 03/22/16 12:30 PM  Result Value Ref Range   Sodium 138 135 - 145 mmol/L   Potassium 4.3 3.5 - 5.1 mmol/L   Chloride 108 101 - 111 mmol/L   CO2 24 22 - 32 mmol/L   Glucose, Bld 81 65 - 99 mg/dL   BUN 16 6 - 20 mg/dL   Creatinine, Ser 1.10 (H) 0.44 - 1.00 mg/dL   Calcium 8.7 (L) 8.9 - 10.3 mg/dL   GFR calc non Af Amer 45 (L) >60 mL/min   GFR calc Af Amer 52 (L) >60 mL/min    Comment: (NOTE) The eGFR has been calculated using the CKD EPI equation. This calculation has not been validated in all clinical situations. eGFR's persistently <60 mL/min signify possible Chronic Kidney Disease.     Anion gap 6 5 - 15  CBC     Status: Abnormal   Collection Time: 03/22/16 12:30 PM  Result Value Ref Range   WBC 3.3 (L) 4.0 - 10.5 K/uL   RBC 3.86 (L) 3.87 - 5.11 MIL/uL   Hemoglobin 12.6 12.0 - 15.0 g/dL   HCT 38.4 36.0 - 46.0 %   MCV 99.5 78.0 - 100.0 fL   MCH 32.6 26.0 - 34.0 pg   MCHC 32.8 30.0 - 36.0 g/dL   RDW 13.8 11.5 - 15.5 %   Platelets 152 150 - 400 K/uL  Brain natriuretic peptide     Status: None   Collection Time: 03/22/16 12:30 PM  Result Value Ref Range   B Natriuretic Peptide 81.9 0.0 - 100.0 pg/mL  CBG monitoring, ED  Status: None   Collection Time: 03/22/16 12:31 PM  Result Value Ref Range   Glucose-Capillary 75 65 - 99 mg/dL  Urinalysis, Routine w reflex microscopic     Status: Abnormal   Collection Time: 03/22/16 12:57 PM  Result Value Ref Range   Color, Urine YELLOW YELLOW   APPearance CLEAR CLEAR   Specific Gravity, Urine 1.010 1.005 - 1.030   pH 7.5 5.0 - 8.0   Glucose, UA NEGATIVE NEGATIVE mg/dL   Hgb urine dipstick NEGATIVE NEGATIVE   Bilirubin Urine NEGATIVE NEGATIVE   Ketones, ur NEGATIVE NEGATIVE mg/dL   Protein, ur NEGATIVE NEGATIVE mg/dL   Nitrite NEGATIVE NEGATIVE   Leukocytes, UA TRACE (A) NEGATIVE  Urine microscopic-add on     Status: Abnormal   Collection Time: 03/22/16 12:57 PM  Result Value Ref Range   Squamous Epithelial / LPF 0-5 (A) NONE SEEN   WBC, UA 0-5 0 - 5 WBC/hpf   RBC / HPF 0-5 0 - 5 RBC/hpf   Bacteria, UA RARE (A) NONE SEEN   Casts HYALINE CASTS (A) NEGATIVE  TSH     Status: None   Collection Time: 03/22/16  1:00 PM  Result Value Ref Range   TSH 1.700 0.350 - 4.500 uIU/mL    Comment: Performed by a 3rd Generation assay with a functional sensitivity of <=0.01 uIU/mL.  I-stat troponin, ED     Status: None   Collection Time: 03/22/16  1:30 PM  Result Value Ref Range   Troponin i, poc 0.01 0.00 - 0.08 ng/mL   Comment 3            Comment: Due to the release kinetics of cTnI, a negative result within the first  hours of the onset of symptoms does not rule out myocardial infarction with certainty. If myocardial infarction is still suspected, repeat the test at appropriate intervals.   I-Stat Troponin, ED (not at [facility])     Status: None   Collection Time: 03/22/16  3:50 PM  Result Value Ref Range   Troponin i, poc 0.00 0.00 - 0.08 ng/mL   Comment 3            Comment: Due to the release kinetics of cTnI, a negative result within the first hours of the onset of symptoms does not rule out myocardial infarction with certainty. If myocardial infarction is still suspected, repeat the test at appropriate intervals.   Basic metabolic panel     Status: Abnormal   Collection Time: 03/23/16  2:11 AM  Result Value Ref Range   Sodium 137 135 - 145 mmol/L   Potassium 3.8 3.5 - 5.1 mmol/L   Chloride 104 101 - 111 mmol/L   CO2 23 22 - 32 mmol/L   Glucose, Bld 77 65 - 99 mg/dL   BUN 17 6 - 20 mg/dL   Creatinine, Ser 0.94 0.44 - 1.00 mg/dL   Calcium 8.9 8.9 - 10.3 mg/dL   GFR calc non Af Amer 54 (L) >60 mL/min   GFR calc Af Amer >60 >60 mL/min    Comment: (NOTE) The eGFR has been calculated using the CKD EPI equation. This calculation has not been validated in all clinical situations. eGFR's persistently <60 mL/min signify possible Chronic Kidney Disease.    Anion gap 10 5 - 15  CBC     Status: Abnormal   Collection Time: 03/23/16  2:11 AM  Result Value Ref Range   WBC 2.8 (L) 4.0 - 10.5 K/uL   RBC 3.95 3.87 -  5.11 MIL/uL   Hemoglobin 13.1 12.0 - 15.0 g/dL   HCT 38.9 36.0 - 46.0 %   MCV 98.5 78.0 - 100.0 fL   MCH 33.2 26.0 - 34.0 pg   MCHC 33.7 30.0 - 36.0 g/dL   RDW 13.8 11.5 - 15.5 %   Platelets 166 150 - 400 K/uL  T4, free     Status: None   Collection Time: 03/23/16  1:33 PM  Result Value Ref Range   Free T4 1.11 0.61 - 1.12 ng/dL    Comment: (NOTE) Biotin ingestion may interfere with free T4 tests. If the results are inconsistent with the TSH level, previous test results, or  the clinical presentation, then consider biotin interference. If needed, order repeat testing after stopping biotin.      Lipid Panel     Component Value Date/Time   CHOL 174 05/11/2010 1229   TRIG 78.0 05/11/2010 1229   HDL 52.30 05/11/2010 1229   CHOLHDL 3 05/11/2010 1229   VLDL 15.6 05/11/2010 1229   LDLCALC 106 (H) 05/11/2010 1229   LDLDIRECT 130.8 09/30/2006 1047     Lab Results  Component Value Date   HGBA1C 6.0 04/10/2012   HGBA1C 5.9 01/29/2010   HGBA1C 5.8 10/14/2009        HPI : [name] is a 82 y.o. female with a history of SA nodal dysfunction s/p PPM, HTN, mild OSA, dilated ascending aorta 4cm on CT 08/2015 (plan to repeat study in 08/2016) and chronic exertional dyspnea who presented with syncope.   Last seen by Dr. Aundra Dubin 03/08/16. She continues to have progressive exertional dyspnea of unknown etiology. Felt due to deconditioning and perhaps mild diastolic CHF   - Echo (4/65): EF 55-60%, mild MR with mitral valve prolapse, moderate TR, normal RV size and systolic function, PASP 30 mmHg.  -Lexiscan Cardiolite (9/17) with EF 55%, no ischemia/infarction - RHC (10/17) with mean RA 6, PA 28/9, mean PCWP 7, CI 2.37  She was in [facility] up-until yesterday when she had episode of syncope. She was on patio watching family handyman repairing water heater and she suddenly felt dizzy and lightheaded. Next thing she remember is that [place] her up. She felt nauseated and symptoms persisted. EMS was called and found to be hypotensive at 86/50 with CBG of 92. BP with bolus of fluid.   CXR and CT of head unremarkable. Device check was normal per note (unable to find report). Scr of 1.1 that improved with hydration. EKG showed sinus rhythm with PACs. No acute changes.  Electrolytes and scr normal. Troponin negative. TSH normal. Echo showed LV Ef of 55%, grade 1 DD, late systolic MV prolapse with trivial regur, PASP of 28m Hg. Telemetry showed paced rhythm with PACs  and PVCs. Cardiology is asked for further management.  HOSPITAL COURSE:  Syncope-EKG Atrial premature complexes Left anterior fascicular block  -Continue cardiac monitor-NSVT on tele , cards consult requested  -Hold antihypertensive, as well as Ditropan -IVF  She has had extensive cardiac evaluation this year including Echo, Myoview, right heart cath. Right heart cath showed low PCWP of 7 mm Hg. Pacer check showed normal function.  Orthostatic BP check normal post hydration  suspect this was related to hypotension exacerbated by volume depletion and possible vagal component. No further cardiac work up needed. Recommend stopping lasix. Continue pindolol and aldactone at current doses. OK for DC home from cardiac standpoint.  EssentialHTN -Held Lasix, pindolol, Aldactone in setting of syncopal episode   CAD -Continue aspirin  81 mg   Chronic kidney disease stage III -Baseline creatinine around 0.9-1 -Stable, continue to monitor  Depression -Continue Lexapro, Ativan  Hypothyroidism -Continue Synthroid -TSH within normal  GERD -Continue Zantac or pepcid      Discharge Exam: * Blood pressure 118/71, pulse 61, temperature 98.4 F (36.9 C), temperature source Oral, resp. rate 18, height _0  (1.6 m), weight 73.9 kg (163 lb), SpO2 100 %.      Follow-up Information    Walker Kehr, MD. Schedule an appointment as soon as possible for a visit in 2 day(s).   Specialty:  Internal Medicine Why:  hospital follow up Contact information: [address] [place] [address] [phone]           Signed: Reyne Dumas 03/23/2016, 4:14 PM        Time spent >45 mins

## 2016-03-23 NOTE — Consult Note (Signed)
CARDIOLOGY CONSULT NOTE   Patient ID: [name] MRN: [id number] DOB/AGE: [birth year] 82 y.o.  Admit date: 03/22/2016  Primary Physician   Walker Kehr, MD Primary Cardiologist   Dr. Aundra Dubin EP: Dr. Caryl Comes Reason for Consultation   Syncope Requesting Physician  Dr. Allyson Sabal  HPI: [name] is a 82 y.o. female with a history of SA nodal dysfunction s/p PPM, HTN, mild OSA, dilated ascending aorta 4cm on CT 08/2015 (plan to repeat study in 08/2016) and chronic exertional dyspnea who presented with syncope.   Last seen by Dr. Aundra Dubin 03/08/16. She continues to have progressive exertional dyspnea of unknown etiology. Felt due to deconditioning and perhaps mild diastolic CHF   - Echo (A999333): EF 55-60%, mild MR with mitral valve prolapse, moderate TR, normal RV size and systolic function, PASP 30 mmHg.  -Lexiscan Cardiolite (9/17) with EF 55%, no ischemia/infarction - RHC (10/17) with mean RA 6, PA 28/9, mean PCWP 7, CI 2.37  She was in [facility] up-until yesterday when she had episode of syncope. She was on patio watching family handyman repairing water heater and she suddenly felt dizzy and lightheaded. Next thing she remember is that Willey her up. She felt nauseated and symptoms persisted. EMS was called and found to be hypotensive at 86/50 with CBG of 92. BP with bolus of fluid.   CXR and CT of head unremarkable. Device check was normal per note (unable to find report). Scr of 1.1 that improved with hydration. EKG showed sinus rhythm with PACs. No acute changes.  Electrolytes and scr normal. Troponin negative. TSH normal. Echo showed LV Ef of 55%, grade 1 DD, late systolic MV prolapse with trivial regur, PASP of 30mm Hg. Telemetry showed paced rhythm with PACs and PVCs. Cardiology is asked for further management.  Orthostatic VS for the past 24 hrs:  BP- Lying Pulse- Lying BP- Sitting Pulse- Sitting BP- Standing at 0 minutes Pulse- Standing at 0 minutes  03/23/16 1301 125/67 61 152/77  61 136/74 62      Past Medical History:  Diagnosis Date  . Anxiety   . Bradycardia   . Breast cancer, left breast ([place]) 1995  . Breast cancer, right breast ([place]) 2002  . CHF (congestive heart failure) ([place])   . Depression    Dr Cheryln Manly  . Discoid lupus    skin  . Full dentures   . GERD (gastroesophageal reflux disease)   . Glaucoma   . Gout   . Hx of echocardiogram    a.  Echocardiogram (03/2011): Mild LVH, EF 0000000, grade 1 diastolic dysfunction, mild MR, mild to moderate TR, PASP 42;  b. Echo (05/2013):  Mod LVH, EF 65-70%, no WMA, Gr 1 DD, mild MR, mod TR, PASP 39  . Hx of exercise stress test    a. ETT-echocardiogram (06/08/11): Sub-optimal exercise. Test stopped early due to dizziness and hypotension. EF 60%.  Non-diagnostic.;   b. Lexiscan Myoview (06/2013):  Diaphragmatic attenuation, no ischemia, EF 61%.  Low Risk  . Hypertension   . Hypothyroidism   . IBS (irritable bowel syndrome)   . IBS (irritable bowel syndrome)   . Incontinence   . OSA on CPAP    "quit wearing mask; retested; not enough information; to be tested again 04/08/2016"  . Osteoarthritis   . Osteopenia   . Pacemaker 2009   Brady//Chronotropic incompetence with normal pacemaker function  . Thyroid disease   . Wears hearing aid    both ears     Past Surgical  History:  Procedure Laterality Date  . BREAST BIOPSY Left 1995  . BREAST LUMPECTOMY Left 1995   axillary node dissectoin  . CARDIAC CATHETERIZATION N/A 02/13/2016   Procedure: Right Heart Cath;  Surgeon: Larey Dresser, MD;  Location: [facility];  Service: Cardiovascular;  Laterality: N/A;  . CATARACT EXTRACTION W/ INTRAOCULAR LENS  IMPLANT, BILATERAL Bilateral 06/2009 - 12.2915   left - right  . DORSAL COMPARTMENT RELEASE  2000   left  . HAMMER TOE SURGERY Left 2004  . HEMORRHOID BANDING    . INSERT / REPLACE / REMOVE PACEMAKER  2009  . KNEE ARTHROSCOPY Left 08/29/2012   Procedure: LEFT KNEE ARTHROSCOPY ;  Surgeon: Hessie Dibble, MD;  Location: [facility];  Service: Orthopedics;  Laterality: Left;  [name] MASTECTOMY Bilateral 09/2000   nbx  . TRIGGER FINGER RELEASE Right 07/31/2013   Procedure: EXCISE MASS RIGHT INDEX A-1 PLLLEY RELEASE A-1 RIGHT INDEX;  Surgeon: Cammie Sickle., MD;  Location: [facility];  Service: Orthopedics;  Laterality: Right;  [name] VAGINAL HYSTERECTOMY      Allergies  Allergen Reactions  . Lactase Diarrhea  . Amlodipine Besy-Benazepril Hcl Swelling  . Amlodipine Besylate Swelling  . Clonidine Hydrochloride     REACTION: tired  . Lactose Intolerance (Gi) Other (See Comments)  . Valsartan Other (See Comments)    Tongue swelling   . Verapamil     I have reviewed the patient's current medications . aspirin  81 mg Oral Daily  . escitalopram  10 mg Oral QHS  . famotidine  20 mg Oral Daily  . feeding supplement (ENSURE ENLIVE)  237 mL Oral BID BM  . fluticasone furoate-vilanterol  1 puff Inhalation Daily  . heparin  5,000 Units Subcutaneous Q8H  . levothyroxine  100 mcg Oral QAC breakfast  . loratadine  10 mg Oral Daily  . psyllium  1 packet Oral Q breakfast  . sodium chloride flush  3 mL Intravenous Q12H   . sodium chloride 50 mL/hr at 03/23/16 1202   albuterol, hydroxypropyl methylcellulose / hypromellose, LORazepam  Prior to Admission medications   Medication Sig Start Date End Date Taking? Authorizing Provider  albuterol (PROVENTIL HFA;VENTOLIN HFA) 108 (90 Base) MCG/ACT inhaler Inhale 2 puffs into the lungs every 6 (six) hours as needed for wheezing or shortness of breath. 09/04/15  Yes Brand Males, MD  allopurinol (ZYLOPRIM) 300 MG tablet TAKE 1 TABLET BY MOUTH DAILY 03/01/16  Yes Deboraha Sprang, MD  aspirin 81 MG chewable tablet Chew 81 mg by mouth daily.   Yes Historical Provider, MD  cetirizine (ZYRTEC) 10 MG tablet Take 10 mg by mouth daily as needed for allergies.   Yes Historical Provider, MD  escitalopram (LEXAPRO) 10 MG tablet TAKE 1  TABLET BY MOUTH EVERY DAY Patient taking differently: TAKE 1 TABLET (10 MG) BY MOUTH DAILY AT BEDTIME. 01/07/16  Yes Kathlee Nations, MD  fluocinonide cream (LIDEX) [id number] % Apply 1 application topically daily as needed (For spots on face/itching.).  09/11/15  Yes Historical Provider, MD  fluticasone furoate-vilanterol (BREO ELLIPTA) 200-25 MCG/INH AEPB Inhale 1 puff into the lungs daily. 09/04/15  Yes Brand Males, MD  furosemide (LASIX) 20 MG tablet TAKE 1 TABLET (20 MG TOTAL) BY MOUTH DAILY. 03/15/16  Yes Larey Dresser, MD  hydroxypropyl methylcellulose / hypromellose (ISOPTO TEARS / GONIOVISC) 2.5 % ophthalmic solution Place 1-2 drops into both eyes 3 (three) times daily as needed for dry eyes.  Yes Historical Provider, MD  KLOR-CON M20 20 MEQ tablet TAKE 1 TABLET (20 MEQ TOTAL) BY MOUTH DAILY. 03/15/16  Yes Larey Dresser, MD  levothyroxine (SYNTHROID, LEVOTHROID) 100 MCG tablet Take 1 tablet (100 mcg total) by mouth daily. Patient taking differently: Take 100 mcg by mouth daily before breakfast.  08/29/15  Yes Evie Lacks Plotnikov, MD  LORazepam (ATIVAN) 0.5 MG tablet Take 1 tablet (0.5 mg total) by mouth 2 (two) times daily as needed (for anxiety.). 03/15/16  Yes Evie Lacks Plotnikov, MD  oxybutynin (DITROPAN) 5 MG tablet Take 1 tablet (5 mg total) by mouth 3 (three) times daily. Patient taking differently: Take 5 mg by mouth 2 (two) times daily.  12/15/15  Yes Evie Lacks Plotnikov, MD  pindolol (VISKEN) 5 MG tablet TAKE 1 TABLET BY MOUTH DAILY 12/08/15  Yes Evie Lacks Plotnikov, MD  psyllium (METAMUCIL SMOOTH TEXTURE) 28 % packet Take 1 packet by mouth daily with breakfast.   Yes Historical Provider, MD  ranitidine (ZANTAC) 150 MG tablet Take 150 mg by mouth 3 (three) times daily. 01/01/16  Yes Historical Provider, MD  spironolactone (ALDACTONE) 25 MG tablet Take 1 tablet (25 mg total) by mouth daily. 10/27/15  Yes Cassandria Anger, MD     Social History   Social History  . Marital status:  Widowed    Spouse name: N/A  . Number of children: N/A  . Years of education: N/A   Occupational History  . retired    Social History Main Topics  . Smoking status: Never Smoker  . Smokeless tobacco: Never Used  . Alcohol use No  . Drug use: No  . Sexual activity: No   Other Topics Concern  . Not on file   Social History Narrative   She is a vegan now 2010    Family Status  Relation Status  . Mother Deceased  . Father Deceased  . Sister Deceased  . Brother Deceased  . Daughter Alive  . Son Alive  . Son Alive  . Maternal Grandmother Deceased  . Maternal Grandfather Deceased  . Paternal Grandmother Deceased  . Paternal Grandfather Deceased   Family History  Problem Relation Age of Onset  . Ovarian cancer Mother   . Heart disease Father   . Stroke Sister   . Stroke Brother   . Cancer Brother   . Heart disease Brother    ROS:  Full 14 point review of systems complete and found to be negative unless listed above.  Physical Exam: Blood pressure 118/71, pulse 61, temperature 98.4 F (36.9 C), temperature source Oral, resp. rate 18, height 5\' 3"  (1.6 m), weight 163 lb (73.9 kg), SpO2 100 %.  General: Well developed, well nourished, female in no acute distress Head: Eyes PERRLA, No xanthomas. Normocephalic and atraumatic, oropharynx without edema or exudate.  Lungs: Resp regular and unlabored, CTA. Heart: RRR no s3, s4, or murmurs..   Neck: No carotid bruits. No lymphadenopathy. No  JVD. Abdomen: Bowel sounds present, abdomen soft and non-tender without masses or hernias noted. Msk:  No spine or cva tenderness. No weakness, no joint deformities or effusions. Extremities: No clubbing, cyanosis or edema. DP/PT/Radials 2+ and equal bilaterally. Neuro: Alert and oriented X 3. No focal deficits noted. Psych:  Good affect, responds appropriately Skin: No rashes or lesions noted.  Labs:   Lab Results  Component Value Date   WBC 2.8 (L) 03/23/2016   HGB 13.1  03/23/2016   HCT 38.9 03/23/2016   MCV 98.5  03/23/2016   PLT 166 03/23/2016   No results for input(s): INR in the last 72 hours.  Recent Labs Lab 03/23/16 0211  NA 137  K 3.8  CL 104  CO2 23  BUN 17  CREATININE 0.94  CALCIUM 8.9  GLUCOSE 77   No results found for: MG No results for input(s): CKTOTAL, CKMB, TROPONINI in the last 72 hours.  Recent Labs  03/22/16 1330 03/22/16 1550  TROPIPOC 0.01 0.00   Pro B Natriuretic peptide (BNP)  Date/Time Value Ref Range Status  01/22/2014 01:14 PM 283.3 0 - 450 pg/mL Final  05/29/2013 12:36 PM 103.0 (H) 0.0 - 100.0 pg/mL Final   Lab Results  Component Value Date   CHOL 174 05/11/2010   HDL 52.30 05/11/2010   LDLCALC 106 (H) 05/11/2010   TRIG 78.0 05/11/2010   Lab Results  Component Value Date   DDIMER 0.75 (H) 04/13/2011   Lipase  Date/Time Value Ref Range Status  09/19/2007 05:45 AM 22  Final   Amylase  Date/Time Value Ref Range Status  09/19/2007 05:45 AM 162 (H)  Final   TSH  Date/Time Value Ref Range Status  03/22/2016 01:00 PM 1.700 0.350 - 4.500 uIU/mL Final    Comment:    Performed by a 3rd Generation assay with a functional sensitivity of <=0.01 uIU/mL.  07/21/2015 04:24 PM 1.36 0.35 - 4.50 uIU/mL Final   T3, Free  Date/Time Value Ref Range Status  09/21/2007 05:20 AM 2.3 2.3 - 4.2 Final   Vitamin B-12  Date/Time Value Ref Range Status  09/27/2008 11:20 AM 1171 (H) 211 - 911 pg/mL Final   Iron  Date/Time Value Ref Range Status  02/29/2008 11:37 AM 53 42 - 145 mcg/dL Final    Echo:  03/23/16 ------------------------------------------------------------------- LV EF: 55%  ------------------------------------------------------------------- Indications:      Syncope 780.2.  ------------------------------------------------------------------- History:   PMH:  Hypothyroidism. Pacemaker. Obstructive sleep apnea (on CPAP). Bilateral breast cancer. Bradycardia.  Congestive heart failure.  Risk  factors:  Hypertension.  ------------------------------------------------------------------- Study Conclusions  - Left ventricle: The cavity size was normal. There was moderate   focal basal hypertrophy. Systolic function was normal. The   estimated ejection fraction was 55%. Wall motion was normal;   there were no regional wall motion abnormalities. There was an   increased relative contribution of atrial contraction to   ventricular filling. Doppler parameters are consistent with   abnormal left ventricular relaxation (grade 1 diastolic   dysfunction). - Mitral valve: Mild, late systolicprolapse, involving the   posterior leaflet. There was trivial regurgitation. - Tricuspid valve: There was mild regurgitation. - Pulmonary arteries: PA peak pressure: 31 mm Hg (S).    Radiology:  Dg Chest 2 View  Result Date: 03/22/2016 CLINICAL DATA:  Syncope and nausea with cough and congestion. History of breast carcinoma EXAM: CHEST  2 VIEW COMPARISON:  Chest radiograph February 13, 2015 and chest CT Sep 10, 2015 FINDINGS: There is no edema or consolidation. Heart size and pulmonary vascularity are normal. There is atherosclerotic calcification in the aorta. Pacemaker leads are attached to the right atrium and right ventricle. No adenopathy. No evident bone lesions. There are surgical clips in the left axillary region. IMPRESSION: No edema or consolidation. Aortic atherosclerosis. Pacemaker leads are attached to the right atrium and right ventricle. Electronically Signed   By: Lowella Grip III M.D.   On: 03/22/2016 14:16   Ct Head Wo Contrast  Result Date: 03/22/2016 CLINICAL DATA:  Dizziness, syncopal episode. History of  breast cancer, atrial fibrillation, hypertension. EXAM: CT HEAD WITHOUT CONTRAST TECHNIQUE: Contiguous axial images were obtained from the base of the skull through the vertex without intravenous contrast. COMPARISON:  CT HEAD May 28, 2012 FINDINGS: BRAIN: The ventricles  and sulci are normal for age. No intraparenchymal hemorrhage, mass effect nor midline shift. Patchy supratentorial white matter hypodensities less than expected for patient's age, though non-specific are most compatible with chronic small vessel ischemic disease. No acute large vascular territory infarcts. No abnormal extra-axial fluid collections. Basal cisterns are patent. VASCULAR: Mild calcific atherosclerosis of the carotid siphons. SKULL: No skull fracture. No significant scalp soft tissue swelling. SINUSES/ORBITS: The mastoid air-cells and included paranasal sinuses are well-aerated.The included ocular globes and orbital contents are non-suspicious. OTHER: None. IMPRESSION: No acute intracranial process; stable negative CT HEAD for age. Electronically Signed   By: Elon Alas M.D.   On: 03/22/2016 13:56    ASSESSMENT AND PLAN:     1. Syncope - She was hypotensive by EMS. Negative orthostatic vitals. Device check is normal per note. Unable to find report. EKG reassuring. Telemetry with paced rhythm with PACs and PVCs. Echo showed LV Ef of 55%, grade 1 DD, late systolic MV prolapse with trivial regur, PASP of 5mm Hg. Telemetry showed paced rhythm.   2. Sinoatrial node dysfunction (HCC) - s/p [employer] PPM  3. HTN - Home dose of lasix 20mg , pindolol 5mg  and spironolactone 25mg  held. BP stable now. D/C lasix and continue pindolol and spironolactone.   4. Chronic diastolic CHF - Stable shortness of breath. BNP of 81. She is euvolemic on exam.    Signed: Bhagat,Bhavinkumar, PA 03/23/2016, 2:52 PM Pager [phone]  Co-Sign MD  Patient seen and examined and history reviewed. Agree with above findings and plan. 82 yo BF with history of PPM and diastolic CHF presents following a syncopal episode. Episode was witnessed. Patient had preceding dizziness. Witness noted her falling over and she was helped to ground. No injury. Felt nauseated when she came to. No chest pain or SOB. No recent  change in health except for allergies- taking Zyrtec.  She has had extensive cardiac evaluation this year including Echo, Myoview, right heart cath. Right heart cath showed low PCWP of 7 mm Hg. Pacer check showed normal function.  Orthostatic BP check normal post hydration I suspect this was related to hypotension exacerbated by volume depletion and possible vagal component. No further cardiac work up needed. Recommend stopping lasix. Continue pindolol and aldactone at current doses. OK for DC home from our standpoint.  Destan Franchini [place], [place] 03/23/2016 3:33 PM

## 2016-03-23 NOTE — Progress Notes (Signed)
[facility] PROGRESS NOTE  [name] [id number] DOB: 1930/03/10 DOA: 03/22/2016   PCP: Walker Kehr, MD     Assessment/Plan: Principal Problem:   Syncope Active Problems:   Hypothyroidism   Sinoatrial node dysfunction (HCC)   GERD   Pacemaker-[employer]onic   Benign essential HTN   CAD (coronary artery disease)   CKD (chronic kidney disease) stage 3, GFR 30-59 ml/min   Depression     82 y.o. female with medical history significant of SA nodal dysfunction s/p PPM, HTN, CAD, chronic exertional dyspnea, OSA, dilated ascending aorta, depression, hypothyroidism who presents after a syncopal episode. She states that she was walking, when she felt lightheaded/dizzy.CT head, chest x-ray obtained which were both unremarkable  Assessment/Plan Syncope- EKG Atrial premature complexes Left anterior fascicular block  -Continue cardiac monitor-NSVT on tele , cards consult requested  -Hold antihypertensive, as well as Ditropan -IVF  She has had extensive cardiac evaluation this year including Echo, Myoview, right heart cath. Right heart cath showed low PCWP of 7 mm Hg. Pacer check showed normal function.  Orthostatic BP check normal post hydration  suspect this was related to hypotension exacerbated by volume depletion and possible vagal component. No further cardiac work up needed. Recommend stopping lasix. Continue pindolol and aldactone at current doses. OK for DC home from our standpoint.  Essential HTN -Hold Lasix, pindolol, Aldactone in setting of syncopal episode   CAD -Continue aspirin 81 mg   Chronic kidney disease stage III -Baseline creatinine around 0.9-1 -Stable, continue to monitor  Depression -Continue Lexapro, Ativan  Hypothyroidism -Continue Synthroid -TSH within normal  GERD -Continue Zantac or pepcid    DVT prophylaxsis heparin  Code Status:  Full code     Family Communication: Discussed in detail with the patient, all imaging  results, lab results explained to the patient   Disposition Plan:  In am      Consultants:  cardiology  Procedures:  None   Antibiotics: Anti-infectives    None         HPI/Subjective: Denies any dizziness   Objective: Vitals:   03/22/16 1727 03/22/16 2123 03/23/16 0516 03/23/16 1128  BP: 132/80 125/73 124/77 118/71  Pulse: 65 65 60 61  Resp: 17 20 18    Temp: 98.6 F (37 C) 97.8 F (36.6 C) 98.4 F (36.9 C)   TempSrc: Oral Oral Oral   SpO2: 98% 100% 100%   Weight:      Height:        Intake/Output Summary (Last 24 hours) at 03/23/16 1228 Last data filed at 03/23/16 0700  Gross per 24 hour  Intake              665 ml  Output                0 ml  Net              665 ml    Exam:  Examination:  General exam: Appears calm and comfortable  Respiratory system: Clear to auscultation. Respiratory effort normal. Cardiovascular system: S1 & S2 heard, RRR. No JVD, murmurs, rubs, gallops or clicks. No pedal edema. Gastrointestinal system: Abdomen is nondistended, soft and nontender. No organomegaly or masses felt. Normal bowel sounds heard. Central nervous system: Alert and oriented. No focal neurological deficits. Extremities: Symmetric 5 x 5 power. Skin: No rashes, lesions or ulcers Psychiatry: Judgement and insight appear normal. Mood & affect appropriate.     Data Reviewed: I have personally reviewed  following labs and imaging studies  Micro Results No results found for this or any previous visit (from the past 240 hour(s)).  Radiology Reports Dg Chest 2 View  Result Date: 03/22/2016 CLINICAL DATA:  Syncope and nausea with cough and congestion. History of breast carcinoma EXAM: CHEST  2 VIEW COMPARISON:  Chest radiograph February 13, 2015 and chest CT Sep 10, 2015 FINDINGS: There is no edema or consolidation. Heart size and pulmonary vascularity are normal. There is atherosclerotic calcification in the aorta. Pacemaker leads are attached to the right  atrium and right ventricle. No adenopathy. No evident bone lesions. There are surgical clips in the left axillary region. IMPRESSION: No edema or consolidation. Aortic atherosclerosis. Pacemaker leads are attached to the right atrium and right ventricle. Electronically Signed   By: Lowella Grip III M.D.   On: 03/22/2016 14:16   Ct Head Wo Contrast  Result Date: 03/22/2016 CLINICAL DATA:  Dizziness, syncopal episode. History of breast cancer, atrial fibrillation, hypertension. EXAM: CT HEAD WITHOUT CONTRAST TECHNIQUE: Contiguous axial images were obtained from the base of the skull through the vertex without intravenous contrast. COMPARISON:  CT HEAD May 28, 2012 FINDINGS: BRAIN: The ventricles and sulci are normal for age. No intraparenchymal hemorrhage, mass effect nor midline shift. Patchy supratentorial white matter hypodensities less than expected for patient's age, though non-specific are most compatible with chronic small vessel ischemic disease. No acute large vascular territory infarcts. No abnormal extra-axial fluid collections. Basal cisterns are patent. VASCULAR: Mild calcific atherosclerosis of the carotid siphons. SKULL: No skull fracture. No significant scalp soft tissue swelling. SINUSES/ORBITS: The mastoid air-cells and included paranasal sinuses are well-aerated.The included ocular globes and orbital contents are non-suspicious. OTHER: None. IMPRESSION: No acute intracranial process; stable negative CT HEAD for age. Electronically Signed   By: Elon Alas M.D.   On: 03/22/2016 13:56     CBC  Recent Labs Lab 03/22/16 1230 03/23/16 0211  WBC 3.3* 2.8*  HGB 12.6 13.1  HCT 38.4 38.9  PLT 152 166  MCV 99.5 98.5  MCH 32.6 33.2  MCHC 32.8 33.7  RDW 13.8 13.8    Chemistries   Recent Labs Lab 03/22/16 1230 03/23/16 0211  NA 138 137  K 4.3 3.8  CL 108 104  CO2 24 23  GLUCOSE 81 77  BUN 16 17  CREATININE 1.10* 0.94  CALCIUM 8.7* 8.9    ------------------------------------------------------------------------------------------------------------------ estimated creatinine clearance is 42.9 mL/min (by C-G formula based on SCr of 0.94 mg/dL). ------------------------------------------------------------------------------------------------------------------ No results for input(s): HGBA1C in the last 72 hours. ------------------------------------------------------------------------------------------------------------------ No results for input(s): CHOL, HDL, LDLCALC, TRIG, CHOLHDL, LDLDIRECT in the last 72 hours. ------------------------------------------------------------------------------------------------------------------  Recent Labs  03/22/16 1300  TSH 1.700   ------------------------------------------------------------------------------------------------------------------ No results for input(s): VITAMINB12, FOLATE, FERRITIN, TIBC, IRON, RETICCTPCT in the last 72 hours.  Coagulation profile No results for input(s): INR, PROTIME in the last 168 hours.  No results for input(s): DDIMER in the last 72 hours.  Cardiac Enzymes No results for input(s): CKMB, TROPONINI, MYOGLOBIN in the last 168 hours.  Invalid input(s): CK ------------------------------------------------------------------------------------------------------------------ Invalid input(s): POCBNP   CBG:  Recent Labs Lab 03/22/16 1231  GLUCAP 75       Studies: Dg Chest 2 View  Result Date: 03/22/2016 CLINICAL DATA:  Syncope and nausea with cough and congestion. History of breast carcinoma EXAM: CHEST  2 VIEW COMPARISON:  Chest radiograph February 13, 2015 and chest CT Sep 10, 2015 FINDINGS: There is no edema or consolidation. Heart size and pulmonary vascularity  are normal. There is atherosclerotic calcification in the aorta. Pacemaker leads are attached to the right atrium and right ventricle. No adenopathy. No evident bone lesions. There are  surgical clips in the left axillary region. IMPRESSION: No edema or consolidation. Aortic atherosclerosis. Pacemaker leads are attached to the right atrium and right ventricle. Electronically Signed   By: Lowella Grip III M.D.   On: 03/22/2016 14:16   Ct Head Wo Contrast  Result Date: 03/22/2016 CLINICAL DATA:  Dizziness, syncopal episode. History of breast cancer, atrial fibrillation, hypertension. EXAM: CT HEAD WITHOUT CONTRAST TECHNIQUE: Contiguous axial images were obtained from the base of the skull through the vertex without intravenous contrast. COMPARISON:  CT HEAD May 28, 2012 FINDINGS: BRAIN: The ventricles and sulci are normal for age. No intraparenchymal hemorrhage, mass effect nor midline shift. Patchy supratentorial white matter hypodensities less than expected for patient's age, though non-specific are most compatible with chronic small vessel ischemic disease. No acute large vascular territory infarcts. No abnormal extra-axial fluid collections. Basal cisterns are patent. VASCULAR: Mild calcific atherosclerosis of the carotid siphons. SKULL: No skull fracture. No significant scalp soft tissue swelling. SINUSES/ORBITS: The mastoid air-cells and included paranasal sinuses are well-aerated.The included ocular globes and orbital contents are non-suspicious. OTHER: None. IMPRESSION: No acute intracranial process; stable negative CT HEAD for age. Electronically Signed   By: Elon Alas M.D.   On: 03/22/2016 13:56      Lab Results  Component Value Date   HGBA1C 6.0 04/10/2012   HGBA1C 5.9 01/29/2010   HGBA1C 5.8 10/14/2009   Lab Results  Component Value Date   LDLCALC 106 (H) 05/11/2010   CREATININE 0.94 03/23/2016       Scheduled Meds: . aspirin  81 mg Oral Daily  . escitalopram  10 mg Oral QHS  . famotidine  20 mg Oral Daily  . feeding supplement (ENSURE ENLIVE)  237 mL Oral BID BM  . fluticasone furoate-vilanterol  1 puff Inhalation Daily  . heparin  5,000  Units Subcutaneous Q8H  . levothyroxine  100 mcg Oral QAC breakfast  . loratadine  10 mg Oral Daily  . psyllium  1 packet Oral Q breakfast  . sodium chloride flush  3 mL Intravenous Q12H   Continuous Infusions: . sodium chloride 50 mL/hr at 03/23/16 1202     LOS: 0 days    Time spent: >30 MINS    [facility]  [facility] Pager [phone]. If 7PM-7AM, please contact night-coverage at [email or web address], password [facility] 03/23/2016, 12:28 PM  LOS: 0 days

## 2016-03-23 NOTE — Progress Notes (Signed)
Pt discharged home. Pt ambulated independently prior to discharge. IV and telemetry box removed. Pt and pt's daughter received discharge instructions and all questions were answered. Pt left with all of her belongings. Pt left the unit via wheelchair and was accompanied by a nurse tech and this RN. Pt was in no distress at time of discharge.   Grant Fontana BSN, RN

## 2016-03-25 DIAGNOSIS — J302 Other seasonal allergic rhinitis: Secondary | ICD-10-CM | POA: Diagnosis not present

## 2016-03-25 DIAGNOSIS — J301 Allergic rhinitis due to pollen: Secondary | ICD-10-CM | POA: Diagnosis not present

## 2016-03-25 DIAGNOSIS — J019 Acute sinusitis, unspecified: Secondary | ICD-10-CM | POA: Diagnosis not present

## 2016-03-26 ENCOUNTER — Telehealth: Payer: Self-pay | Admitting: Internal Medicine

## 2016-03-26 ENCOUNTER — Ambulatory Visit (INDEPENDENT_AMBULATORY_CARE_PROVIDER_SITE_OTHER): Payer: Medicare Other | Admitting: Family

## 2016-03-26 VITALS — BP 152/88 | HR 66 | Temp 97.7°F | Ht 63.0 in | Wt 166.1 lb

## 2016-03-26 DIAGNOSIS — R55 Syncope and collapse: Secondary | ICD-10-CM | POA: Diagnosis not present

## 2016-03-26 DIAGNOSIS — H6121 Impacted cerumen, right ear: Secondary | ICD-10-CM | POA: Diagnosis not present

## 2016-03-26 NOTE — Assessment & Plan Note (Signed)
Impacted cerumen right ear noted and removed without complication and tolerated well. Continue to monitor.

## 2016-03-26 NOTE — Assessment & Plan Note (Signed)
Syncope possibly related to dehydration and vasovagal episode. Continues to remain on furosemide with slightly elevated blood pressure today. No further episodes of lightheadedness, syncope, dizziness, or nausea. Continue to take medications as prescribed and follow-up if symptoms worsen or return.

## 2016-03-26 NOTE — Telephone Encounter (Signed)
Patient is requesting to transfer from [place] to [facility].  Patient seen Calone today and really liked him.

## 2016-03-26 NOTE — Telephone Encounter (Signed)
OK with me Thx

## 2016-03-26 NOTE — Progress Notes (Signed)
Subjective:    Patient ID: [name], female    DOB: 1929/06/06, 82 y.o.   MRN: [id number]  Chief Complaint  Patient presents with  . Hospitalization Follow-up    HPI:  [name] is a 82 y.o. female who  has a past medical history of Anxiety; Bradycardia; Breast cancer, left breast ([place]) (1995); Breast cancer, right breast ([place]) (2002); CHF (congestive heart failure) ([place]); Depression; Discoid lupus; Full dentures; GERD (gastroesophageal reflux disease); Glaucoma; Gout; echocardiogram; exercise stress test; Hypertension; Hypothyroidism; IBS (irritable bowel syndrome); IBS (irritable bowel syndrome); Incontinence; OSA on CPAP; Osteoarthritis; Osteopenia; Pacemaker (2009); Thyroid disease; and Wears hearing aid. and presents today for a hospitalization follow up.  Syncope - recently evaluated in the emergency department for an episode of syncope with described loss of consciousness for approximately one to 5 minutes. At the time assessment sheet continue to experience lightheadedness and nausea. Noted to be unwitnessed syncopal episode. She does not recall hitting her head with CT without any acute intracranial abnormalities. EKG shows a further with no acute signs of ischemia. Troponins were normal. Urinalysis without evidence of infection. She was admitted for observation. 2-D echocardiogram showed ejection fraction of 55% with normal wall motion and grade 1 diastolic dysfunction. It was believed this episode of syncope was related to volume depletion and possible vagal component. Furosemide was stopped in the hospital. And she was discharged home. All records, imaging, and labs reviewed in detail.  Since leaving the hospital she reports no further episodes of syncope, nausea since, or dizziness. Blood pressure has remained stable. She has remained off of her Lasix. Reports taking her medications as prescribed and denies adverse side effects. Able to complete her activities of daily living back  to baseline. She does continue to experience associated symptoms of right ear discomfort. She was seen by allergist within the last 24 hours and started on an antibiotic. She has yet to start the prescribed antibiotic. Per patient the allergist evaluated her ears and noted to have cerumen.  Allergies  Allergen Reactions  . Lactase Diarrhea  . Amlodipine Besy-Benazepril Hcl Swelling  . Amlodipine Besylate Swelling  . Clonidine Hydrochloride     REACTION: tired  . Lactose Intolerance (Gi) Other (See Comments)  . Valsartan Other (See Comments)    Tongue swelling   . Verapamil       Outpatient Medications Prior to Visit  Medication Sig Dispense Refill  . albuterol (PROVENTIL HFA;VENTOLIN HFA) 108 (90 Base) MCG/ACT inhaler Inhale 2 puffs into the lungs every 6 (six) hours as needed for wheezing or shortness of breath. 1 Inhaler 6  . allopurinol (ZYLOPRIM) 300 MG tablet TAKE 1 TABLET BY MOUTH DAILY 30 tablet 1  . aspirin 81 MG chewable tablet Chew 81 mg by mouth daily.    . cetirizine (ZYRTEC) 10 MG tablet Take 10 mg by mouth daily as needed for allergies.    [name] escitalopram (LEXAPRO) 10 MG tablet TAKE 1 TABLET BY MOUTH EVERY DAY (Patient taking differently: TAKE 1 TABLET (10 MG) BY MOUTH DAILY AT BEDTIME.) 90 tablet 0  . feeding supplement, ENSURE ENLIVE, (ENSURE ENLIVE) LIQD Take 237 mLs by mouth 2 (two) times daily between meals. 237 mL 12  . fluocinonide cream (LIDEX) [id number] % Apply 1 application topically daily as needed (For spots on face/itching.).     [name] hydroxypropyl methylcellulose / hypromellose (ISOPTO TEARS / GONIOVISC) 2.5 % ophthalmic solution Place 1-2 drops into both eyes 3 (three) times daily as needed for dry eyes.    [name]  KLOR-CON M20 20 MEQ tablet TAKE 1 TABLET (20 MEQ TOTAL) BY MOUTH DAILY. 30 tablet 3  . levothyroxine (SYNTHROID, LEVOTHROID) 100 MCG tablet Take 1 tablet (100 mcg total) by mouth daily. (Patient taking differently: Take 100 mcg by mouth daily before breakfast. )  90 tablet 3  . LORazepam (ATIVAN) 0.5 MG tablet Take 1 tablet (0.5 mg total) by mouth 2 (two) times daily as needed (for anxiety.). 60 tablet 3  . pindolol (VISKEN) 5 MG tablet TAKE 1 TABLET BY MOUTH DAILY 90 tablet 1  . psyllium (METAMUCIL SMOOTH TEXTURE) 28 % packet Take 1 packet by mouth daily with breakfast.    . ranitidine (ZANTAC) 150 MG tablet Take 150 mg by mouth 3 (three) times daily.    [name] spironolactone (ALDACTONE) 25 MG tablet Take 1 tablet (25 mg total) by mouth daily. 90 tablet 1  . fluticasone furoate-vilanterol (BREO ELLIPTA) 200-25 MCG/INH AEPB Inhale 1 puff into the lungs daily. (Patient not taking: Reported on 03/26/2016) 60 each 5   No facility-administered medications prior to visit.       Past Surgical History:  Procedure Laterality Date  . BREAST BIOPSY Left 1995  . BREAST LUMPECTOMY Left 1995   axillary node dissectoin  . CARDIAC CATHETERIZATION N/A 02/13/2016   Procedure: Right Heart Cath;  Surgeon: Larey Dresser, MD;  Location: [facility];  Service: Cardiovascular;  Laterality: N/A;  . CATARACT EXTRACTION W/ INTRAOCULAR LENS  IMPLANT, BILATERAL Bilateral 06/2009 - 12.2915   left - right  . DORSAL COMPARTMENT RELEASE  2000   left  . HAMMER TOE SURGERY Left 2004  . HEMORRHOID BANDING    . INSERT / REPLACE / REMOVE PACEMAKER  2009  . KNEE ARTHROSCOPY Left 08/29/2012   Procedure: LEFT KNEE ARTHROSCOPY ;  Surgeon: Hessie Dibble, MD;  Location: [facility];  Service: Orthopedics;  Laterality: Left;  [name] MASTECTOMY Bilateral 09/2000   nbx  . TRIGGER FINGER RELEASE Right 07/31/2013   Procedure: EXCISE MASS RIGHT INDEX A-1 PLLLEY RELEASE A-1 RIGHT INDEX;  Surgeon: Cammie Sickle., MD;  Location: [facility];  Service: Orthopedics;  Laterality: Right;  [name] VAGINAL HYSTERECTOMY        Past Medical History:  Diagnosis Date  . Anxiety   . Bradycardia   . Breast cancer, left breast ([place]) 1995  . Breast cancer, right breast ([place])  2002  . CHF (congestive heart failure) ([place])   . Depression    Dr Cheryln Manly  . Discoid lupus    skin  . Full dentures   . GERD (gastroesophageal reflux disease)   . Glaucoma   . Gout   . Hx of echocardiogram    a.  Echocardiogram (03/2011): Mild LVH, EF 0000000, grade 1 diastolic dysfunction, mild MR, mild to moderate TR, PASP 42;  b. Echo (05/2013):  Mod LVH, EF 65-70%, no WMA, Gr 1 DD, mild MR, mod TR, PASP 39  . Hx of exercise stress test    a. ETT-echocardiogram (06/08/11): Sub-optimal exercise. Test stopped early due to dizziness and hypotension. EF 60%.  Non-diagnostic.;   b. Lexiscan Myoview (06/2013):  Diaphragmatic attenuation, no ischemia, EF 61%.  Low Risk  . Hypertension   . Hypothyroidism   . IBS (irritable bowel syndrome)   . IBS (irritable bowel syndrome)   . Incontinence   . OSA on CPAP    "quit wearing mask; retested; not enough information; to be tested again 04/08/2016"  . Osteoarthritis   .  Osteopenia   . Pacemaker 2009   Brady//Chronotropic incompetence with normal pacemaker function  . Thyroid disease   . Wears hearing aid    both ears      Review of Systems  Constitutional: Negative for chills and fever.  HENT: Positive for ear pain. Negative for congestion.   Respiratory: Negative for chest tightness, shortness of breath and wheezing.   Cardiovascular: Negative for chest pain, palpitations and leg swelling.  Neurological: Negative for tremors, seizures, syncope and weakness.      Objective:    BP (!) 152/88 (BP Location: Right Arm, Patient Position: Sitting, Cuff Size: Normal)   Pulse 66   Temp 97.7 F (36.5 C) (Oral)   Ht 5\' 3"  (1.6 m)   Wt 166 lb 1.3 oz (75.3 kg)   SpO2 98%   BMI 29.42 kg/m  Nursing note and vital signs reviewed.  Physical Exam  Constitutional: She is oriented to person, place, and time. She appears well-developed and well-nourished. No distress.  HENT:  Right ear with increased cerumen. No evidence of infection.     Cardiovascular: Normal rate, regular rhythm, normal heart sounds and intact distal pulses.   Pulmonary/Chest: Effort normal and breath sounds normal.  Neurological: She is alert and oriented to person, place, and time.  Skin: Skin is warm and dry.  Psychiatric: She has a normal mood and affect. Her behavior is normal. Judgment and thought content normal.   Procedure: Manual Cerumen Removal  Risk of procedure including alternative treatments were discussed. Patient verbally consented. Time out performed. Bionix lighted ear cleaning system was used to manually remove a mild/moderate amount of soft, yellow cerumen. Improvement was noted at completion of the procedure. No complications and tolerated well.     Assessment & Plan:   Problem List Items Addressed This Visit      Cardiovascular and Mediastinum   Syncope - Primary    Syncope possibly related to dehydration and vasovagal episode. Continues to remain on furosemide with slightly elevated blood pressure today. No further episodes of lightheadedness, syncope, dizziness, or nausea. Continue to take medications as prescribed and follow-up if symptoms worsen or return.        Nervous and Auditory   Impacted cerumen of right ear    Impacted cerumen right ear noted and removed without complication and tolerated well. Continue to monitor.          I am having Ms. Soulsby maintain her levothyroxine, albuterol, fluticasone furoate-vilanterol, fluocinonide cream, spironolactone, pindolol, escitalopram, ranitidine, cetirizine, hydroxypropyl methylcellulose / hypromellose, psyllium, aspirin, allopurinol, KLOR-CON M20, LORazepam, and feeding supplement (ENSURE ENLIVE).   Follow-up: Return in about 3 months (around 06/24/2016), or if symptoms worsen or fail to improve.  Mauricio Po, FNP

## 2016-03-26 NOTE — Patient Instructions (Addendum)
Thank you for choosing [facility].  SUMMARY AND INSTRUCTIONS:  Eat frequent small meals.   Consider Boost / Breeze.   Start the antibiotic previously prescribed.                                                                                                                                                                                           Medication:  Please continue to take your medications as prescribed.   Your prescription(s) have been submitted to your pharmacy or been printed and provided for you. Please take as directed and contact our office if you believe you are having problem(s) with the medication(s) or have any questions.   Follow up:  If your symptoms worsen or fail to improve, please contact our office for further instruction, or in case of emergency go directly to the emergency room at the closest medical facility.

## 2016-03-29 ENCOUNTER — Ambulatory Visit (INDEPENDENT_AMBULATORY_CARE_PROVIDER_SITE_OTHER): Payer: Medicare Other | Admitting: Psychology

## 2016-03-29 DIAGNOSIS — F332 Major depressive disorder, recurrent severe without psychotic features: Secondary | ICD-10-CM | POA: Diagnosis not present

## 2016-03-29 DIAGNOSIS — R42 Dizziness and giddiness: Secondary | ICD-10-CM | POA: Diagnosis not present

## 2016-03-29 NOTE — Telephone Encounter (Signed)
Ok with me 

## 2016-03-29 NOTE — Telephone Encounter (Signed)
Left patient vm to call back to change appointment in March to [place].  Needs to be 30 minute OV.

## 2016-03-30 ENCOUNTER — Ambulatory Visit (INDEPENDENT_AMBULATORY_CARE_PROVIDER_SITE_OTHER): Payer: Medicare Other | Admitting: Internal Medicine

## 2016-03-30 ENCOUNTER — Encounter: Payer: Self-pay | Admitting: Internal Medicine

## 2016-03-30 VITALS — BP 114/80 | HR 65 | Ht 64.0 in | Wt 168.2 lb

## 2016-03-30 DIAGNOSIS — Z950 Presence of cardiac pacemaker: Secondary | ICD-10-CM | POA: Diagnosis not present

## 2016-03-30 DIAGNOSIS — I495 Sick sinus syndrome: Secondary | ICD-10-CM

## 2016-03-30 LAB — CUP PACEART INCLINIC DEVICE CHECK
Brady Statistic AP VP Percent: 3 %
Brady Statistic AS VS Percent: 11 %
Date Time Interrogation Session: 20171205174142
Implantable Lead Implant Date: 20090527
Implantable Lead Location: 753859
Lead Channel Pacing Threshold Amplitude: 0.75 V
Lead Channel Sensing Intrinsic Amplitude: 4 mV
Lead Channel Setting Pacing Amplitude: 2 V
Lead Channel Setting Pacing Pulse Width: 0.4 ms
Lead Channel Setting Sensing Sensitivity: 2 mV
MDC IDC LEAD IMPLANT DT: 20090527
MDC IDC LEAD LOCATION: 753860
MDC IDC MSMT BATTERY IMPEDANCE: 2333 Ohm
MDC IDC MSMT BATTERY REMAINING LONGEVITY: 27 mo
MDC IDC MSMT BATTERY VOLTAGE: 2.74 V
MDC IDC MSMT LEADCHNL RA IMPEDANCE VALUE: 515 Ohm
MDC IDC MSMT LEADCHNL RA PACING THRESHOLD PULSEWIDTH: 0.4 ms
MDC IDC MSMT LEADCHNL RA SENSING INTR AMPL: 4 mV
MDC IDC MSMT LEADCHNL RV IMPEDANCE VALUE: 555 Ohm
MDC IDC MSMT LEADCHNL RV PACING THRESHOLD AMPLITUDE: 0.5 V
MDC IDC MSMT LEADCHNL RV PACING THRESHOLD PULSEWIDTH: 0.4 ms
MDC IDC PG IMPLANT DT: 20090527
MDC IDC SET LEADCHNL RV PACING AMPLITUDE: 2.5 V
MDC IDC STAT BRADY AP VS PERCENT: 85 %
MDC IDC STAT BRADY AS VP PERCENT: 1 %

## 2016-03-30 NOTE — Patient Instructions (Addendum)
Medication Instructions: - Your physician recommends that you continue on your current medications as directed. Please refer to the Current Medication list given to you today.  Labwork: - none ordered  Procedures/Testing: - none ordered  Follow-Up: - Remote monitoring is used to monitor your Pacemaker of ICD from home. This monitoring reduces the number of office visits required to check your device to one time per year. It allows [place] to keep an eye on the functioning of your device to ensure it is working properly. You are scheduled for a device check from home on 06/29/16. You may send your transmission at any time that day. If you have a wireless device, the transmission will be sent automatically. After your physician reviews your transmission, you will receive a postcard with your next transmission date.  - Your physician wants you to follow-up in: 1 year with Dr. Caryl Comes. You will receive a reminder letter in the mail two months in advance. If you don't receive a letter, please call our office to schedule the follow-up appointment.  Any Additional Special Instructions Will Be Listed Below (If Applicable).     If you need a refill on your cardiac medications before your next appointment, please call your pharmacy.

## 2016-03-30 NOTE — Progress Notes (Signed)
Patient Care Team: Cassandria Anger, MD as PCP - General Kathlee Nations, MD (Psychiatry) Richmond Campbell, MD (Gastroenterology) Fay Records, MD (Cardiology) Johnathan Hausen, MD (General Surgery) Melrose Nakayama, MD as Consulting Physician (Orthopedic Surgery) Larey Dresser, MD as Consulting Physician (Cardiology)   HPI  [name] is a 82 y.o. female seen in followup for orthostatic intolerance, exercise intolerance, and prior pacemaker implantation for sinus node dysfunction.   She continues to  complain of dyspnea on exertion  Dr MR  Dr. DM and the heart failure clinic has been working on trying to sort this out. Stress echo 2012 normal LV function.   Echo (7/17): EF 55-60%, mild MR with mitral valve prolapse, moderate TR, normal RV size and systolic function, PASP 30 mmHg.  -Lexiscan Cardiolite (9/17) with EF 55%, no ischemia/infarction - RHC (10/17) with mean RA 6, PA 28/9, mean PCWP 7, CI 2.37    She was admitted with syncope 11/17. She was nauseated upon awakening. Blood pressure is 86/50. She was resuscitated with volume; orthostatic vital signs were incomplete   She has no orthopnea or nocturnal dyspnea.  NO Lightheadedness      Her grandson has finally moved out     Past Medical History:  Diagnosis Date  . Anxiety   . Bradycardia   . Breast cancer, left breast ([place]) 1995  . Breast cancer, right breast ([place]) 2002  . CHF (congestive heart failure) ([place])   . Depression    Dr Cheryln Manly  . Discoid lupus    skin  . Full dentures   . GERD (gastroesophageal reflux disease)   . Glaucoma   . Gout   . Hx of echocardiogram    a.  Echocardiogram (03/2011): Mild LVH, EF 0000000, grade 1 diastolic dysfunction, mild MR, mild to moderate TR, PASP 42;  b. Echo (05/2013):  Mod LVH, EF 65-70%, no WMA, Gr 1 DD, mild MR, mod TR, PASP 39  . Hx of exercise stress test    a. ETT-echocardiogram (06/08/11): Sub-optimal exercise. Test stopped early due to dizziness and  hypotension. EF 60%.  Non-diagnostic.;   b. Lexiscan Myoview (06/2013):  Diaphragmatic attenuation, no ischemia, EF 61%.  Low Risk  . Hypertension   . Hypothyroidism   . IBS (irritable bowel syndrome)   . IBS (irritable bowel syndrome)   . Incontinence   . OSA on CPAP    "quit wearing mask; retested; not enough information; to be tested again 04/08/2016"  . Osteoarthritis   . Osteopenia   . Pacemaker 2009   Brady//Chronotropic incompetence with normal pacemaker function  . Thyroid disease   . Wears hearing aid    both ears    Past Surgical History:  Procedure Laterality Date  . BREAST BIOPSY Left 1995  . BREAST LUMPECTOMY Left 1995   axillary node dissectoin  . CARDIAC CATHETERIZATION N/A 02/13/2016   Procedure: Right Heart Cath;  Surgeon: Larey Dresser, MD;  Location: [facility];  Service: Cardiovascular;  Laterality: N/A;  . CATARACT EXTRACTION W/ INTRAOCULAR LENS  IMPLANT, BILATERAL Bilateral 06/2009 - 12.2915   left - right  . DORSAL COMPARTMENT RELEASE  2000   left  . HAMMER TOE SURGERY Left 2004  . HEMORRHOID BANDING    . INSERT / REPLACE / REMOVE PACEMAKER  2009  . KNEE ARTHROSCOPY Left 08/29/2012   Procedure: LEFT KNEE ARTHROSCOPY ;  Surgeon: Hessie Dibble, MD;  Location: [facility];  Service: Orthopedics;  Laterality: Left;  [name] MASTECTOMY Bilateral 09/2000   nbx  . TRIGGER FINGER RELEASE Right 07/31/2013   Procedure: EXCISE MASS RIGHT INDEX A-1 PLLLEY RELEASE A-1 RIGHT INDEX;  Surgeon: Cammie Sickle., MD;  Location: [facility];  Service: Orthopedics;  Laterality: Right;  [name] VAGINAL HYSTERECTOMY      Current Outpatient Prescriptions  Medication Sig Dispense Refill  . albuterol (PROVENTIL HFA;VENTOLIN HFA) 108 (90 Base) MCG/ACT inhaler Inhale 2 puffs into the lungs every 6 (six) hours as needed for wheezing or shortness of breath. 1 Inhaler 6  . allopurinol (ZYLOPRIM) 300 MG tablet TAKE 1 TABLET BY MOUTH DAILY 30 tablet 1  .  amoxicillin (AMOXIL) 875 MG tablet Take 1 tablet by mouth daily. Take for 10 days    . aspirin 81 MG chewable tablet Chew 81 mg by mouth daily.    . cetirizine (ZYRTEC) 10 MG tablet Take 10 mg by mouth daily as needed for allergies.    [name] escitalopram (LEXAPRO) 10 MG tablet TAKE 1 TABLET BY MOUTH EVERY DAY (Patient taking differently: TAKE 1 TABLET (10 MG) BY MOUTH DAILY AT BEDTIME.) 90 tablet 0  . feeding supplement, ENSURE ENLIVE, (ENSURE ENLIVE) LIQD Take 237 mLs by mouth 2 (two) times daily between meals. 237 mL 12  . fluocinonide cream (LIDEX) [id number] % Apply 1 application topically daily as needed (For spots on face/itching.).     [name] fluticasone furoate-vilanterol (BREO ELLIPTA) 200-25 MCG/INH AEPB Inhale 1 puff into the lungs daily. 60 each 5  . hydroxypropyl methylcellulose / hypromellose (ISOPTO TEARS / GONIOVISC) 2.5 % ophthalmic solution Place 1-2 drops into both eyes 3 (three) times daily as needed for dry eyes.    [name] KLOR-CON M20 20 MEQ tablet TAKE 1 TABLET (20 MEQ TOTAL) BY MOUTH DAILY. 30 tablet 3  . levothyroxine (SYNTHROID, LEVOTHROID) 100 MCG tablet Take 1 tablet (100 mcg total) by mouth daily. (Patient taking differently: Take 100 mcg by mouth daily before breakfast. ) 90 tablet 3  . LORazepam (ATIVAN) 0.5 MG tablet Take 1 tablet (0.5 mg total) by mouth 2 (two) times daily as needed (for anxiety.). 60 tablet 3  . oxybutynin (DITROPAN) 5 MG tablet Take 1 tablet by mouth daily as needed for urinary retention.    . pindolol (VISKEN) 5 MG tablet TAKE 1 TABLET BY MOUTH DAILY 90 tablet 1  . psyllium (METAMUCIL SMOOTH TEXTURE) 28 % packet Take 1 packet by mouth daily with breakfast.    . ranitidine (ZANTAC) 150 MG tablet Take 150 mg by mouth 3 (three) times daily.    [name] spironolactone (ALDACTONE) 25 MG tablet Take 1 tablet (25 mg total) by mouth daily. 90 tablet 1   No current facility-administered medications for this visit.     Allergies  Allergen Reactions  . Lactase Diarrhea  .  Amlodipine Besy-Benazepril Hcl Swelling  . Amlodipine Besylate Swelling  . Clonidine Hydrochloride     REACTION: tired  . Lactose Intolerance (Gi) Other (See Comments)  . Valsartan Other (See Comments)    Tongue swelling   . Verapamil     Review of Systems negative except from HPI and PMH  Physical Exam BP 114/80   Pulse 65   Ht 5\' 4"  (1.626 m)   Wt 168 lb 3.2 oz (76.3 kg)   SpO2 98%   BMI 28.87 kg/m  Well developed and well nourished in no acute distress HENT normal E scleral and icterus clear Neck Supple JVP flat; carotids brisk and full Clear  to ausculation  Regular rate and rhythm, no murmurs gallops or rub Soft with active bowel sounds No clubbing cyanosis none Edema Alert and oriented, grossly normal motor and sensory function Skin Warm and Dry  ECG  Sinus at 67  Intervals 20/07/40  Axis -45  Nonspecific ST-T changes     Assessment and  Plan  Sinus node dysfunction   Pacemaker  Medtronic  The patient's device was interrogated.  The information was reviewed. No changes were made in the programming.     Hypertension   Stable  Measurements at home in the 120 range   BP  well contolled  Exercise tolerance is stable but remains poor.       Euvolemic continue current meds    Social issues remain a challenge with her son now at home with her grandson

## 2016-04-01 DIAGNOSIS — H04123 Dry eye syndrome of bilateral lacrimal glands: Secondary | ICD-10-CM | POA: Diagnosis not present

## 2016-04-01 DIAGNOSIS — H401231 Low-tension glaucoma, bilateral, mild stage: Secondary | ICD-10-CM | POA: Diagnosis not present

## 2016-04-02 ENCOUNTER — Ambulatory Visit (INDEPENDENT_AMBULATORY_CARE_PROVIDER_SITE_OTHER): Payer: Medicare Other | Admitting: Internal Medicine

## 2016-04-02 ENCOUNTER — Encounter: Payer: Self-pay | Admitting: Internal Medicine

## 2016-04-02 VITALS — BP 118/76 | HR 77 | Ht 64.0 in | Wt 162.0 lb

## 2016-04-02 DIAGNOSIS — R042 Hemoptysis: Secondary | ICD-10-CM

## 2016-04-02 DIAGNOSIS — J4530 Mild persistent asthma, uncomplicated: Secondary | ICD-10-CM

## 2016-04-02 NOTE — Patient Instructions (Addendum)
ICD-9-CM ICD-10-CM   1. Hemoptysis 786.30 R04.2   2. Mild persistent asthma, uncomplicated 123456 [id number]     Hemoptysis - one time and resolved. No cause based on ENT exam and CT chest  if returns consider bronchoscopy   Chronic cough Mild persistent asthma, uncomplicated  - likey due to asthma based on repeated elevation of nitric oxide test - glad breo helped but now cough is back because you are off breo - restart low dose breo - continue this daily - rec high dose flu shot 04/02/2016 - if you refuse we will document it  #Followup - 9 months or sooner if needed

## 2016-04-02 NOTE — Progress Notes (Signed)
Subjective:     Patient ID: [name], female   DOB: 05-15-1929, 82 y.o.   MRN: [id number]  HPI  PCP Walker Kehr, MD   HPI  Brief patient profile:  63 yobf never smoker with h/o sneezing/itching/coughing  eval 1979 and placed on allergy shots ever since but nose runs year round  if doesn't  take zyrtec developed new unexplained doe 2014 self referred to pulmonary clinic 06/20/2013 with nl  pfts 07/02/13     History of Present Illness  06/20/2013 1st [facility] office visit/ Wert cc progressive doe x one year (chart suggests dates back to 2012 however) indolent onset to point where can't do steps at [place], can't finish silver sneakers, can't walk a mile, can do grocery shopping but has to rest before going across the parking lot.  Hb during this time worse than usual on aciphex ac and pc rec Stop visken and start bystolic 5 mg one twice daily  Continue aciphex 20 mg Take 30- 60 min before your first and last meals of the day    07/02/2013 f/u ov/Wert re: unexplained sob Chief Complaint  Patient presents with  . Follow-up    Pt reports her breathing is unchanged since her last visit. She c/o HA since she started taking bystolic.   ha is random, transient, not related to time of day or activity, generalized, s nausea of neuro/viz cc's rec Finish your cardiac evaluation first, then if not doing better call [id number] for CPST with spirometry before and after > neg myoview, was not physically stressed at any point Zebeta 5 mg reduce to  one half tablet daily  Take only the medications on your list as they are on your after visit summary  If heart burn acts up, ok to resume aciphex 20 mg  > it did flare so she continue aciphe bid    08/07/2013 f/u ov/Wert re: unexplained doe, takes various alternative rx/ "nothing worksAdvertising account planner Complaint  Patient presents with  . Follow-up    Breathing is unchanged since last visit. She states that Cardiology ruled out any heart problems and  advised her to f/u here.    no better or worse doe since onset, never occurs at rest or sleeping, still can't do a grocery store s stopping to rest  rec Continue on current regimen   Follow med calendar closely and bring to each visit.   08/23/2013 followup and medication review. Patient returns for a followup visit and medication review. Reviewed all her medications and organized them into a medication calendar. Patient education. Appears the patient is taking her medication currently. Says her dyspnea is unchanged since last ov; no new complaints. rec Continue on current regimen  Labs today.  Follow med calendar closely and bring to each visit   09/20/2013 f/u ov/Wert re: sob x much worse since  first of the year/ no med calendar Chief Complaint  Patient presents with  . Shortness of Breath    Breathing is unchanged. Still reports DOE. Denies chest tightness or coughing.  no resting or noct symptoms - doe not really worse since onset   No obvious day to day or daytime variabilty or assoc chronic cough or cp or chest tightness, subjective wheeze overt sinus or hb symptoms. No unusual exp hx or h/o childhood pna/ asthma or knowledge of premature birth.  Sleeping ok without nocturnal  or early am exacerbation  of respiratory  c/o's or need for noct saba. Also denies any obvious  fluctuation of symptoms with weather or environmental changes or other aggravating or alleviating factors except as outlined above OV 09/04/2015      Chief Complaint  Patient presents with  . Pulmonary Consult    Pt referred by Dr. Harold Hedge. Pt c/o blood tinged sputum when waking in morning x 1 year. Pt c/o intermittent DOE. Pt denies CP/tightness.        Referred by Dr. Fredderick Phenix for hemoptysis. Patient herself is a poor historian. She was last seen by Dr. Melvyn Novas in our office in May 2015 and discharged from follow-up. As best as I can gather in reviewing outside chart, old chart and talking to the  patient she seems to have a chronic cough for the last 1 year. She says it is mild but on RSI cough score below it is obviously a lot more severe in symptoms with a score of 29. She says early in the morning when she gets up she notices a mild amount of hemoptysis. Although when I questioned her if she is coughing it up she says it comes from her throat. Tablet every other day. This been no weight loss since then. This no shortness of breath or wheezing. She follows at the [facility] for 1 or 2 decades. She's been on allergy shots to a few years ago. And then that was stopped. She is on follow-up with antihistamines but review of the records. She denies any shortness of breath. She is a nonsmoker. She does complain of scratchy throat and postnasal drip. She's never seen a ENT physician. She seems to think all her cough is due to acid reflux  no matter what I tried to explain   Exhaled nitric oxide in our office today 09/04/2015 - slightly elevated at 41 ppb; denies having been on any asthma inhalers for many years  She is unsure if she wants to do testing for hemoptysis ".doc do what you  need to do"  Prior imaging reviewed and visualized 04/16/2011 she had CT angiogram chest that ruled out pulmonary embolism. Pulmonary parenchyma was clear of infiltrates most recently she had chest x-ray 02/13/2015 that is clear of any infiltrates. She had full pulmonary function test March 2015 that I personally visualized and looks normal except for isolated low diffusion capacity of 61%. She had echocardiogram February 2015 that shows mild diastolic dysfunction she's not on any anticoagulants. According   OV 09/30/2015  Chief Complaint  Patient presents with  . Follow-up    pt states her SOB has improved, no more hemoptysis but does note a prod cough with yellow/white mucus.  Denies CP, fever, chest tightness.     Follow-up hemoptysis - is as never recurred after the first time. CT chest is clear CT  sinuses clear. She prefers not to have bronchoscopy/  reports that she has never smoked. She has never used smokeless tobacco.   Follow-up chronic cough presumably due to asthma based on exhaled nitric oxide testing last visit: Now on Brio and she is significantly better. Off and sputum production are all improved. She is only taking Brio 3 times a week because she forgets.  Other evaluation: She has dyspnea. CT chest showed pulmonary hypertension. Is also significant echocardiogram 2015. I made a referral to Dr. Aundra Dubin but she is able to see him I do not see any appointment made for him.   OV 04/02/2016  Chief Complaint  Patient presents with  . Follow-up    Pt c/o prod cough with yellow  mucus - pt was given abx by allergist and pt states the abx are helping. Pt deneis hemoptysis and CP/tightness.    Patient is a very poor historian. She confuses history. Last seen June 2017  - Hemoptysis: At the time of my visit in June 2017 she said she has not had any hemoptysis. Today she tells me that she has not had hemoptysis in the last few months. However she is unable to ascertain if she has been hemoptysis free since June 2017. As best as I can tell she's been hemoptysis..  Evaluation for pulmonary arterial hypertension based on CT chest: Right heart pressures aon cardiac catheterization 02/13/2016 by Dr. [name] a normal period this was reviewed by myself  Chronic cough labeled as mild persistent asthma due to borderline high nitric oxide and responds to [place] in the summer 2017: She tells me that she is not taking the Brio anymore because someone told her she does not have asthma. She also tells me that the cough is present now and is mild to moderate in intensity with some element mucus she is unable to tell if it is worse than baseline or not. My sense is that is only mild-moderate in intensity.  Dr Lorenza [place] Reflux Symptom Index (> 13-15 suggestive of LPR cough) 0 -> 5  =  none ->severe  problem 09/04/2015   Hoarseness of problem with voice 1  Clearing  Of Throat 2  Excess throat mucus or feeling of post nasal drip 5  Difficulty swallowing food, liquid or tablets 0  Cough after eating or lying down 5  Breathing difficulties or choking episodes 4  Troublesome or annoying cough 3  Sensation of something sticking in throat or lump in throat 4  Heartburn, chest pain, indigestion, or stomach acid coming up 5  TOTAL 29       has a past medical history of Anxiety; Bradycardia; Breast cancer, left breast ([place]) (1995); Breast cancer, right breast ([place]) (2002); CHF (congestive heart failure) ([place]); Depression; Discoid lupus; Full dentures; GERD (gastroesophageal reflux disease); Glaucoma; Gout; echocardiogram; exercise stress test; Hypertension; Hypothyroidism; IBS (irritable bowel syndrome); IBS (irritable bowel syndrome); Incontinence; OSA on CPAP; Osteoarthritis; Osteopenia; Pacemaker (2009); Thyroid disease; and Wears hearing aid.   reports that she has never smoked. She has never used smokeless tobacco.  Past Surgical History:  Procedure Laterality Date  . BREAST BIOPSY Left 1995  . BREAST LUMPECTOMY Left 1995   axillary node dissectoin  . CARDIAC CATHETERIZATION N/A 02/13/2016   Procedure: Right Heart Cath;  Surgeon: Larey Dresser, MD;  Location: [facility];  Service: Cardiovascular;  Laterality: N/A;  . CATARACT EXTRACTION W/ INTRAOCULAR LENS  IMPLANT, BILATERAL Bilateral 06/2009 - 12.2915   left - right  . DORSAL COMPARTMENT RELEASE  2000   left  . HAMMER TOE SURGERY Left 2004  . HEMORRHOID BANDING    . INSERT / REPLACE / REMOVE PACEMAKER  2009  . KNEE ARTHROSCOPY Left 08/29/2012   Procedure: LEFT KNEE ARTHROSCOPY ;  Surgeon: Hessie Dibble, MD;  Location: [facility];  Service: Orthopedics;  Laterality: Left;  [name] MASTECTOMY Bilateral 09/2000   nbx  . TRIGGER FINGER RELEASE Right 07/31/2013   Procedure: EXCISE MASS RIGHT INDEX A-1 PLLLEY  RELEASE A-1 RIGHT INDEX;  Surgeon: Cammie Sickle., MD;  Location: [facility];  Service: Orthopedics;  Laterality: Right;  [name] VAGINAL HYSTERECTOMY      Allergies  Allergen Reactions  . Lactase Diarrhea  .  Amlodipine Besy-Benazepril Hcl Swelling  . Amlodipine Besylate Swelling  . Clonidine Hydrochloride     REACTION: tired  . Lactose Intolerance (Gi) Other (See Comments)  . Valsartan Other (See Comments)    Tongue swelling   . Verapamil     There is no immunization history for the selected administration types on file for this patient.  Family History  Problem Relation Age of Onset  . Ovarian cancer Mother   . Heart disease Father   . Stroke Sister   . Stroke Brother   . Cancer Brother   . Heart disease Brother      Current Outpatient Prescriptions:  .  allopurinol (ZYLOPRIM) 300 MG tablet, TAKE 1 TABLET BY MOUTH DAILY, Disp: 30 tablet, Rfl: 1 .  amoxicillin (AMOXIL) 875 MG tablet, Take 1 tablet by mouth daily. Take for 10 days, Disp: , Rfl:  .  aspirin 81 MG chewable tablet, Chew 81 mg by mouth daily., Disp: , Rfl:  .  cetirizine (ZYRTEC) 10 MG tablet, Take 10 mg by mouth daily as needed for allergies., Disp: , Rfl:  .  escitalopram (LEXAPRO) 10 MG tablet, TAKE 1 TABLET BY MOUTH EVERY DAY (Patient taking differently: TAKE 1 TABLET (10 MG) BY MOUTH DAILY AT BEDTIME.), Disp: 90 tablet, Rfl: 0 .  feeding supplement, ENSURE ENLIVE, (ENSURE ENLIVE) LIQD, Take 237 mLs by mouth 2 (two) times daily between meals., Disp: 237 mL, Rfl: 12 .  fluocinonide cream (LIDEX) [id number] %, Apply 1 application topically daily as needed (For spots on face/itching.). , Disp: , Rfl:  .  hydroxypropyl methylcellulose / hypromellose (ISOPTO TEARS / GONIOVISC) 2.5 % ophthalmic solution, Place 1-2 drops into both eyes 3 (three) times daily as needed for dry eyes., Disp: , Rfl:  .  KLOR-CON M20 20 MEQ tablet, TAKE 1 TABLET (20 MEQ TOTAL) BY MOUTH DAILY., Disp: 30 tablet, Rfl: 3 .   levothyroxine (SYNTHROID, LEVOTHROID) 100 MCG tablet, Take 1 tablet (100 mcg total) by mouth daily. (Patient taking differently: Take 100 mcg by mouth daily before breakfast. ), Disp: 90 tablet, Rfl: 3 .  LORazepam (ATIVAN) 0.5 MG tablet, Take 1 tablet (0.5 mg total) by mouth 2 (two) times daily as needed (for anxiety.)., Disp: 60 tablet, Rfl: 3 .  oxybutynin (DITROPAN) 5 MG tablet, Take 1 tablet by mouth daily as needed for urinary retention., Disp: , Rfl:  .  pindolol (VISKEN) 5 MG tablet, TAKE 1 TABLET BY MOUTH DAILY, Disp: 90 tablet, Rfl: 1 .  psyllium (METAMUCIL SMOOTH TEXTURE) 28 % packet, Take 1 packet by mouth daily with breakfast., Disp: , Rfl:  .  ranitidine (ZANTAC) 150 MG tablet, Take 150 mg by mouth 3 (three) times daily., Disp: , Rfl:  .  spironolactone (ALDACTONE) 25 MG tablet, Take 1 tablet (25 mg total) by mouth daily., Disp: 90 tablet, Rfl: 1 .  albuterol (PROVENTIL HFA;VENTOLIN HFA) 108 (90 Base) MCG/ACT inhaler, Inhale 2 puffs into the lungs every 6 (six) hours as needed for wheezing or shortness of breath. (Patient not taking: Reported on 04/02/2016), Disp: 1 Inhaler, Rfl: 6 .  fluticasone furoate-vilanterol (BREO ELLIPTA) 200-25 MCG/INH AEPB, Inhale 1 puff into the lungs daily. (Patient not taking: Reported on 04/02/2016), Disp: 60 each, Rfl: 5   Review of Systems     Objective:   Physical Exam  Constitutional: She is oriented to person, place, and time. She appears well-developed and well-nourished. No distress.  HENT:  Head: Normocephalic and atraumatic.  Right Ear: External ear normal.  Left  Ear: External ear normal.  Mouth/Throat: Oropharynx is clear and moist. No oropharyngeal exudate.  Eyes: Conjunctivae and EOM are normal. Pupils are equal, round, and reactive to light. Right eye exhibits no discharge. Left eye exhibits no discharge. No scleral icterus.  Neck: Normal range of motion. Neck supple. No JVD present. No tracheal deviation present. No thyromegaly present.   Cardiovascular: Normal rate, regular rhythm, normal heart sounds and intact distal pulses.  Exam reveals no gallop and no friction rub.   No murmur heard. Pulmonary/Chest: Effort normal and breath sounds normal. No respiratory distress. She has no wheezes. She has no rales. She exhibits no tenderness.  Abdominal: Soft. Bowel sounds are normal. She exhibits no distension and no mass. There is no tenderness. There is no rebound and no guarding.  Musculoskeletal: Normal range of motion. She exhibits no edema or tenderness.  Lymphadenopathy:    She has no cervical adenopathy.  Neurological: She is alert and oriented to person, place, and time. She has normal reflexes. No cranial nerve deficit. She exhibits normal muscle tone. Coordination normal.  Skin: Skin is warm and dry. No rash noted. She is not diaphoretic. No erythema. No pallor.  Psychiatric: She has a normal mood and affect. Her behavior is normal. Judgment and thought content normal.  Vitals reviewed.   Vitals:   04/02/16 0905  BP: 118/76  Pulse: 77  SpO2: 94%  Weight: 162 lb (73.5 kg)  Height: 5\' 4"  (1.626 m)    Estimated body mass index is 27.81 kg/m as calculated from the following:   Height as of this encounter: 5\' 4"  (1.626 m).   Weight as of this encounter: 162 lb (73.5 kg).      Assessment:       ICD-9-CM ICD-10-CM   1. Hemoptysis 786.30 R04.2   2. Mild persistent asthma, uncomplicated 123456 [id number]        Plan:       Hemoptysis - one time and resolved. No cause based on ENT exam and CT chest  if returns consider bronchoscopy   Chronic cough Mild persistent asthma, uncomplicated  - likey due to asthma based on repeated elevation of nitric oxide test - glad breo helped but now cough is back because you are off breo - restart low dose breo - continue this daily - rec high dose flu shot 04/02/2016 - if you refuse we will document it  #Followup - 9 months or sooner if needed      Dr. Brand Males, M.D., [facility].C.P Pulmonary and Critical Care Medicine Staff Physician [facility] Pager: [phone], If no answer or between  15:00h - 7:00h: call [phone]  04/02/2016 9:29 AM

## 2016-04-03 ENCOUNTER — Encounter: Payer: Self-pay | Admitting: Internal Medicine

## 2016-04-05 NOTE — Telephone Encounter (Signed)
chagne to symbicort 80/4.5 2 puff bid

## 2016-04-05 NOTE — Telephone Encounter (Signed)
MR  Pt. Sent these two emails below   Conversation: Non-Urgent Medical Question  (Newest Message First)  [name]  to Brand Males, MD       04/03/16 5:37 PM  Dr. Alfonso Patten.  Please cancel the request about the [facility]. I have requested a refill. I am going to use the med as you recommended.  I am sure you would not recommend anything that will harm me. I will pick up the RX tomorrow.  Thank you.    [name]  to Brand Males, MD       04/03/16 12:40 PM  Dr Chase Caller,  There are a lots of serious negative side effects on the BREO. Maybe you can prescribe something  with less negative side effects. I live alone and have a fear of fainting from dizzeness due to Rx or other medical problems.  Thank you. I appreciate all you do to help me feel better.  [name]

## 2016-04-06 ENCOUNTER — Telehealth: Payer: Self-pay | Admitting: Internal Medicine

## 2016-04-06 NOTE — Telephone Encounter (Signed)
Patient returned phone call... Contact # [phone].[name]

## 2016-04-06 NOTE — Telephone Encounter (Signed)
MR  We received an email from the pt which is below. She stated that she has already picked up the [facility]. Her initial email she had some concerns about the [facility] and then she sent another email stating to disregard. You made the suggestions to change her to Symbicort. We wanted to make you aware of this.      [name]  to Brand Males, MD       8:53 PM  I have already picked up the [facility] and used it one time. I didn't pick it up until 6:30pm. The [facility] [address]) is in my chart. I get my messages on my computer, not a cell phone. I don't check my computer all day for messages. If I had received a phone call before 5:30 or someone could have called [facility] before 5:30 I have gotten the message on time. I paid $27.00. I am allergy to milk, and all dairy products. I have IBS. I should have reported this before. It is my chart.  I am sorry.       Brand Males, MD      12:14 PM  Note    chagne to symbicort 80/4.5 2 puff bid

## 2016-04-06 NOTE — Telephone Encounter (Signed)
LMOM TCB x1 to pt.   Need to know what pt. Would like to do a message was sent to MR because pt. Stated she had already picked up the [facility] that was prescribed after she changed her mind about wanting it switched to something else and MR suggested Symbicort. Please review what she wants to do.  [name]  to Brand Males, MD       8:53 PM  I have already picked up the [facility] and used it one time. I didn't pick it up until 6:30pm. [facility] [address] is in my chart. I get my messages on my computer, not a cell phone. I don't check my computer all day for messages. If I had received a phone call before 5:30 or someone could have [facility] before 5:30 I have gotten the message on time. I paid $27.00. I am allergy to milk, and all dairy products. I have IBS. I should have reported this before. It is my chart.  I am sorry.   Lorane Gell, CMA  to [name]       4:07 PM  [name],    Dr Chase Caller reviewed your chart and concerns with [facility] inhaler. He is suggesting we rx Symbicort 80/4.5 2 puffs twice daily and RINSE your MOUTH after use. We need to confirm which pharmacy you would like that sent to.    Thanks and look forward to hearing from you soon.   [place] [facility] Triage Nurse    Last read by [name] at

## 2016-04-06 NOTE — Telephone Encounter (Signed)
[facility] Symbicort for patient

## 2016-04-06 NOTE — Telephone Encounter (Signed)
LMOMTCB x 1 

## 2016-04-06 NOTE — Telephone Encounter (Signed)
Patient request to call her on cell phone [phone].

## 2016-04-07 MED ORDER — BUDESONIDE-FORMOTEROL FUMARATE 80-4.5 MCG/ACT IN AERO: 2.0000 | INHALATION_SPRAY | Freq: Two times a day (BID) | RESPIRATORY_TRACT | 5 refills | Status: DC

## 2016-04-07 NOTE — Telephone Encounter (Signed)
Spoke with the pt  She states that she saw the mychart msg were MR rec symbicort 80, but this was never sent  I have sent this in to her pharmacy and she states nothing further needed

## 2016-04-07 NOTE — Telephone Encounter (Signed)
Patient stated she want the last medication prescribed sent over to the pharmacy. Patient stated the medication start with an "s".

## 2016-04-08 ENCOUNTER — Ambulatory Visit (HOSPITAL_BASED_OUTPATIENT_CLINIC_OR_DEPARTMENT_OTHER): Payer: Medicare Other | Attending: Cardiology | Admitting: Cardiology

## 2016-04-08 DIAGNOSIS — G4733 Obstructive sleep apnea (adult) (pediatric): Secondary | ICD-10-CM | POA: Diagnosis not present

## 2016-04-08 DIAGNOSIS — Z79899 Other long term (current) drug therapy: Secondary | ICD-10-CM | POA: Diagnosis not present

## 2016-04-09 NOTE — Telephone Encounter (Signed)
If she still has concerna bout breo, try symbicort  Dr. Brand Males, M.D., [facility].C.P Pulmonary and Critical Care Medicine Staff Physician [place] [facility] Pager: [phone], If no answer or between  15:00h - 7:00h: call [phone]  04/09/2016 11:51 AM

## 2016-04-12 ENCOUNTER — Ambulatory Visit (INDEPENDENT_AMBULATORY_CARE_PROVIDER_SITE_OTHER): Payer: Medicare Other | Admitting: Psychology

## 2016-04-12 DIAGNOSIS — F332 Major depressive disorder, recurrent severe without psychotic features: Secondary | ICD-10-CM

## 2016-04-13 ENCOUNTER — Encounter: Payer: Self-pay | Admitting: Internal Medicine

## 2016-04-13 NOTE — Telephone Encounter (Signed)
LM for pt to return call Pt asked via voicemail and e-mail to please let us know what other symptoms she is having so that we may better treat her.  Pt advised also to contact our office in the future via telephone with questions regarding any acute symptoms.

## 2016-04-14 ENCOUNTER — Telehealth: Payer: Self-pay | Admitting: Internal Medicine

## 2016-04-14 ENCOUNTER — Other Ambulatory Visit (HOSPITAL_COMMUNITY): Payer: Self-pay

## 2016-04-14 DIAGNOSIS — F330 Major depressive disorder, recurrent, mild: Secondary | ICD-10-CM

## 2016-04-14 MED ORDER — ESCITALOPRAM OXALATE 10 MG PO TABS: ORAL_TABLET | ORAL | 0 refills | Status: DC

## 2016-04-14 MED ORDER — DOXYCYCLINE HYCLATE 100 MG PO TABS: 100.0000 mg | ORAL_TABLET | Freq: Two times a day (BID) | ORAL | 0 refills | Status: DC

## 2016-04-14 MED ORDER — PREDNISONE 10 MG PO TABS: ORAL_TABLET | ORAL | 0 refills | Status: DC

## 2016-04-14 NOTE — Telephone Encounter (Signed)
Patient states she has had a cough now for over 3 days. Productive cough with yellow phlegm. Denies fever, chills, or body aches. Wants to know if she can have a round of Prednisone sent to pharmacy. Thanks!

## 2016-04-14 NOTE — Telephone Encounter (Signed)
Spoke with pt. She is aware of MR's recommendations. Rxs have been sent in. States that she is taking a maintenance inhaler >> Symbicort 80.

## 2016-04-14 NOTE — Telephone Encounter (Signed)
Spoke with pt over the phone. States that she is having issues with her breathing. Reports cough and SOB. Cough is producing yellow mucus. Cough is worse at night time. Denies chest tightness, wheezing or fever. Symptoms started 1 week ago. Would like to have prednisone sent in.  MR - please advise. Thanks.

## 2016-04-14 NOTE — Telephone Encounter (Signed)
This matter is being handled through a telephone encounter as the pt called after I spoke with her this AM. MyChart message will be closed.

## 2016-04-14 NOTE — Telephone Encounter (Signed)
She has ae-sathma  Allergies  Allergen Reactions  . Lactase Diarrhea  . Amlodipine Besy-Benazepril Hcl Swelling  . Amlodipine Besylate Swelling  . Clonidine Hydrochloride     REACTION: tired  . Lactose Intolerance (Gi) Other (See Comments)  . Valsartan Other (See Comments)    Tongue swelling   . Verapamil      Take doxycycline 100mg  po twice daily x 5 days; take after meals and avoid sunlight  Please take prednisone 40 mg x1 day, then 30 mg x1 day, then 20 mg x1 day, then 10 mg x1 day, and then 5 mg x1 day and stop  She needs to be on maintenance - She had concerns about BREO. She needs to be on some maintenance - either breo, or symbicort or advair or dulera. Or else she will be flaring up like this all the time   Dr. Brand Males, M.D., [facility].C.P Pulmonary and Critical Care Medicine Staff Physician [place] [facility] Pager: [phone], If no answer or between  15:00h - 7:00h: call [phone]  04/14/2016 4:46 PM

## 2016-04-16 ENCOUNTER — Other Ambulatory Visit: Payer: Self-pay | Admitting: Internal Medicine

## 2016-04-16 ENCOUNTER — Encounter: Payer: Self-pay | Admitting: Internal Medicine

## 2016-04-21 ENCOUNTER — Telehealth: Payer: Self-pay | Admitting: Internal Medicine

## 2016-04-21 MED ORDER — CLOTRIMAZOLE 10 MG MT TROC: 10.0000 mg | Freq: Every day | OROMUCOSAL | 0 refills | Status: DC

## 2016-04-21 NOTE — Telephone Encounter (Signed)
Spoke with pt, who states her tongue is sore and is covered in white X 5d. Pt states her throat is red as well. Pt is requesting a medication to be sent in.  TP please advise. Thanks.

## 2016-04-21 NOTE — Telephone Encounter (Signed)
Brush/rinse after inhalers May have thrush  Send in Mycelex troche #35 , 1 troche five times daily for 1 week , no refills  If not resolved will need ov  Please contact office for sooner follow up if symptoms do not improve or worsen or seek emergency care

## 2016-04-21 NOTE — Telephone Encounter (Signed)
Spoke with pt, aware of recs.  rx sent to preferred pharmacy.  Nothing further needed.  

## 2016-04-25 NOTE — Procedures (Signed)
   Patient Name: [name], [name] Date: 04/08/2016 Gender: Female D.O.B: 1930-04-06 Age (years): 87 Referring Provider: [name] Height (inches): 63 Interpreting Physician: Fransico Him MD, ABSM Weight (lbs): 163 RPSGT: Gerhard Perches BMI: 29 MRN: [id number] Neck Size: 14.00  CLINICAL INFORMATION The patient is referred for a CPAP titration to treat sleep apnea. Date of NPSG, Split Night or HST:02/10/2016  SLEEP STUDY TECHNIQUE As per the AASM Manual for the Scoring of Sleep and Associated Events v2.3 (April 2016) with a hypopnea requiring 4% desaturations.  The channels recorded and monitored were frontal, central and occipital EEG, electrooculogram (EOG), submentalis EMG (chin), nasal and oral airflow, thoracic and abdominal wall motion, anterior tibialis EMG, snore microphone, electrocardiogram, and pulse oximetry. Continuous positive airway pressure (CPAP) was initiated at the beginning of the study and titrated to treat sleep-disordered breathing.  MEDICATIONS Medications self-administered by patient taken the night of the study : ESCITALOPRAM, LORAZEPAM, OXYBUTNIN  TECHNICIAN COMMENTS Comments added by technician: Patient was restless all through the night. CPAP  TITRATION  Comments added by scorer: N/A  RESPIRATORY PARAMETERS Optimal PAP Pressure (cm): 12  AHI at Optimal Pressure (/hr):0.0 Overall Minimal O2 (%):94.00  Supine % at Optimal Pressure (%):100 Minimal O2 at Optimal Pressure (%): 95.0    SLEEP ARCHITECTURE The study was initiated at 11:15:20 PM and ended at 5:48:48 AM.  Sleep onset time was 8.9 minutes and the sleep efficiency was 52.2%. The total sleep time was 205.5 minutes.  The patient spent 27.98% of the night in stage N1 sleep, 60.58% in stage N2 sleep, 11.44% in stage N3 and 0.00% in REM.Stage REM latency was N/A minutes  Wake after sleep onset was 179.0. Alpha intrusion was absent. Supine sleep was 82.00%.  CARDIAC DATA The 2  lead EKG demonstrated sinus rhythm. The mean heart rate was 56.03 beats per minute. Other EKG findings include: None.  LEG MOVEMENT DATA The total Periodic Limb Movements of Sleep (PLMS) were 325. The PLMS index was 94.89. A PLMS index of <15 is considered normal in adults.  IMPRESSIONS - The optimal PAP pressure was 12 cm of water. - Central sleep apnea was not noted during this titration (CAI = 0.0/h). - Mild oxygen desaturations were observed during this titration (min O2 = 94.00%). - The patient snored with Moderate snoring volume during this titration study. - 2-lead EKG demonstrated: NSR - Severe periodic limb movements were observed during this study. Arousals associated with PLMs were significant.  DIAGNOSIS - Obstructive Sleep Apnea (327.23 [G47.33 ICD-10])  RECOMMENDATIONS - Trial of CPAP therapy on 12 cm H2O with a Small size Resmed Full Face Mask AirFit F20 mask and heated humidification. - Avoid alcohol, sedatives and other CNS depressants that may worsen sleep apnea and disrupt normal sleep architecture. - Sleep hygiene should be reviewed to assess factors that may improve sleep quality. - Weight management and regular exercise should be initiated or continued. - Return to Sleep Center for re-evaluation after 10 weeks of therapy  [place], American Board of Sleep Medicine  ELECTRONICALLY SIGNED ON:  04/25/2016, 1:32 PM [place] PH: [phone]   FX: [phone] [place]

## 2016-04-28 ENCOUNTER — Telehealth: Payer: Self-pay | Admitting: *Deleted

## 2016-04-28 NOTE — Telephone Encounter (Signed)
-----   Message from Sueanne Margarita, MD sent at 04/25/2016  1:36 PM EST ----- Pt had successful PAP titration. Please setup appointment in 10 weeks. Please let [facility] know that order for PAP is in EPIC.

## 2016-04-28 NOTE — Telephone Encounter (Signed)
Called the patient and gave her the sleep study results, she verbalized understanding 

## 2016-04-30 ENCOUNTER — Telehealth: Payer: Self-pay | Admitting: Internal Medicine

## 2016-04-30 ENCOUNTER — Encounter (HOSPITAL_COMMUNITY): Payer: Self-pay | Admitting: Family Medicine

## 2016-04-30 ENCOUNTER — Ambulatory Visit (HOSPITAL_COMMUNITY)
Admission: EM | Admit: 2016-04-30 | Discharge: 2016-04-30 | Disposition: A | Discharge status: Home / Self Care | Payer: Medicare Other | Attending: Emergency Medicine | Admitting: Emergency Medicine

## 2016-04-30 DIAGNOSIS — K602 Anal fissure, unspecified: Secondary | ICD-10-CM | POA: Diagnosis not present

## 2016-04-30 MED ORDER — DILTIAZEM GEL 2 %: 1.0000 "application " | Freq: Two times a day (BID) | CUTANEOUS | 0 refills | Status: DC

## 2016-04-30 NOTE — Telephone Encounter (Signed)
Pt called stating she has bright red blood coming out of her colon. She tried to call her GI doctor but he is out on vacation and she is wondering what to do. Can you please give her a call at [phone]

## 2016-04-30 NOTE — Telephone Encounter (Signed)
Called pt no answer LMOM RTC.../lmb 

## 2016-04-30 NOTE — Telephone Encounter (Signed)
Pls see a PA with GI Go to ER if a lot of blood is coming out or if she is dizzy Thx

## 2016-04-30 NOTE — ED Triage Notes (Signed)
Pt here for rectal bleeding. sts hx of fissures and has been bright red bleeding x a few days. sts some rectal pain. Unable to contact GI doctor. sts she just wants some cream for the discomfort.

## 2016-04-30 NOTE — Discharge Instructions (Signed)
Follow up with your primary care provider for evaluation. You may have a sensitivity with the medication you have been prescribed due to it being similar to other medications you have had an allergy to. Should you have a reaction, stop taking the medicine and seek treatment at the emergency room. I recommend you follow up with your primary care provider or your GI doctor as soon as possible to evaluate your condition.

## 2016-04-30 NOTE — ED Provider Notes (Signed)
CSN: [id number]     Arrival date & time 04/30/16  1358 History   First MD Initiated Contact with Patient 04/30/16 1603     Chief Complaint  Patient presents with  . Rectal Bleeding   (Consider location/radiation/quality/duration/timing/severity/associated sxs/prior Treatment) 83 year old female presents to clinic with chief complaint of "anal fissures" States she has a history of these and has attempted to see her GI doctor and her primary care provider but they are unavailable. She states she wants the cream her PCP has given her before and requests that she not have a rectal exam. Patient states she has had pain for 3 days with defecation and bright red blood on the tissue when she wipes. Patient denies any systemic symptoms, denies any abdominal pain, constipations, diarrhea, or other GI complaints.   The history is provided by the patient.  Rectal Bleeding  Associated symptoms: no abdominal pain and no vomiting     Past Medical History:  Diagnosis Date  . Anxiety   . Bradycardia   . Breast cancer, left breast ([place]) 1995  . Breast cancer, right breast ([place]) 2002  . CHF (congestive heart failure) ([place])   . Depression    Dr Cheryln Manly  . Discoid lupus    skin  . Full dentures   . GERD (gastroesophageal reflux disease)   . Glaucoma   . Gout   . Hx of echocardiogram    a.  Echocardiogram (03/2011): Mild LVH, EF 0000000, grade 1 diastolic dysfunction, mild MR, mild to moderate TR, PASP 42;  b. Echo (05/2013):  Mod LVH, EF 65-70%, no WMA, Gr 1 DD, mild MR, mod TR, PASP 39  . Hx of exercise stress test    a. ETT-echocardiogram (06/08/11): Sub-optimal exercise. Test stopped early due to dizziness and hypotension. EF 60%.  Non-diagnostic.;   b. Lexiscan Myoview (06/2013):  Diaphragmatic attenuation, no ischemia, EF 61%.  Low Risk  . Hypertension   . Hypothyroidism   . IBS (irritable bowel syndrome)   . IBS (irritable bowel syndrome)   . Incontinence   . OSA on CPAP    "quit wearing mask;  retested; not enough information; to be tested again 04/08/2016"  . Osteoarthritis   . Osteopenia   . Pacemaker 2009   Brady//Chronotropic incompetence with normal pacemaker function  . Thyroid disease   . Wears hearing aid    both ears   Past Surgical History:  Procedure Laterality Date  . BREAST BIOPSY Left 1995  . BREAST LUMPECTOMY Left 1995   axillary node dissectoin  . CARDIAC CATHETERIZATION N/A 02/13/2016   Procedure: Right Heart Cath;  Surgeon: Larey Dresser, MD;  Location: [facility];  Service: Cardiovascular;  Laterality: N/A;  . CATARACT EXTRACTION W/ INTRAOCULAR LENS  IMPLANT, BILATERAL Bilateral 06/2009 - 12.2915   left - right  . DORSAL COMPARTMENT RELEASE  2000   left  . HAMMER TOE SURGERY Left 2004  . HEMORRHOID BANDING    . INSERT / REPLACE / REMOVE PACEMAKER  2009  . KNEE ARTHROSCOPY Left 08/29/2012   Procedure: LEFT KNEE ARTHROSCOPY ;  Surgeon: Hessie Dibble, MD;  Location: [facility];  Service: Orthopedics;  Laterality: Left;  [name] MASTECTOMY Bilateral 09/2000   nbx  . TRIGGER FINGER RELEASE Right 07/31/2013   Procedure: EXCISE MASS RIGHT INDEX A-1 PLLLEY RELEASE A-1 RIGHT INDEX;  Surgeon: Cammie Sickle., MD;  Location: [facility];  Service: Orthopedics;  Laterality: Right;  [name] VAGINAL HYSTERECTOMY  Family History  Problem Relation Age of Onset  . Ovarian cancer Mother   . Heart disease Father   . Stroke Sister   . Stroke Brother   . Cancer Brother   . Heart disease Brother    Social History  Substance Use Topics  . Smoking status: Never Smoker  . Smokeless tobacco: Never Used  . Alcohol use No   OB History    No data available     Review of Systems  Constitutional: Negative.   HENT: Negative.   Respiratory: Negative.   Cardiovascular: Negative.   Gastrointestinal: Positive for hematochezia and rectal pain. Negative for abdominal pain, constipation, diarrhea and vomiting.  Genitourinary: Negative.    Musculoskeletal: Negative.   Neurological: Negative.     Allergies  Lactase; Amlodipine besy-benazepril hcl; Amlodipine besylate; Clonidine hydrochloride; Lactose intolerance (gi); Valsartan; and Verapamil  Home Medications   Prior to Admission medications   Medication Sig Start Date End Date Taking? Authorizing Provider  albuterol (PROVENTIL HFA;VENTOLIN HFA) 108 (90 Base) MCG/ACT inhaler Inhale 2 puffs into the lungs every 6 (six) hours as needed for wheezing or shortness of breath. Patient not taking: Reported on 04/02/2016 09/04/15   Brand Males, MD  allopurinol (ZYLOPRIM) 300 MG tablet TAKE 1 TABLET BY MOUTH DAILY 03/01/16   Deboraha Sprang, MD  amoxicillin (AMOXIL) 875 MG tablet Take 1 tablet by mouth daily. Take for 10 days 03/25/16   Historical Provider, MD  aspirin 81 MG chewable tablet Chew 81 mg by mouth daily.    Historical Provider, MD  budesonide-formoterol (SYMBICORT) 80-4.5 MCG/ACT inhaler Inhale 2 puffs into the lungs 2 (two) times daily. 04/07/16   Brand Males, MD  cetirizine (ZYRTEC) 10 MG tablet Take 10 mg by mouth daily as needed for allergies.    Historical Provider, MD  clotrimazole (MYCELEX) 10 MG troche Take 1 tablet (10 mg total) by mouth 5 (five) times daily. 04/21/16   Tammy S Parrett, NP  diltiazem 2 % GEL Apply 1 application topically 2 (two) times daily. 04/30/16   Barnet Glasgow, NP  doxycycline (VIBRA-TABS) 100 MG tablet Take 1 tablet (100 mg total) by mouth 2 (two) times daily. 04/14/16   Brand Males, MD  escitalopram (LEXAPRO) 10 MG tablet TAKE 1 TABLET (10 MG) BY MOUTH DAILY AT BEDTIME. 04/14/16   Kathlee Nations, MD  feeding supplement, ENSURE ENLIVE, (ENSURE ENLIVE) LIQD Take 237 mLs by mouth 2 (two) times daily between meals. 03/23/16   Reyne Dumas, MD  fluocinonide cream (LIDEX) [id number] % Apply 1 application topically daily as needed (For spots on face/itching.).  09/11/15   Historical Provider, MD  fluticasone furoate-vilanterol (BREO ELLIPTA)  200-25 MCG/INH AEPB Inhale 1 puff into the lungs daily. Patient not taking: Reported on 04/02/2016 09/04/15   Brand Males, MD  hydroxypropyl methylcellulose / hypromellose (ISOPTO TEARS / GONIOVISC) 2.5 % ophthalmic solution Place 1-2 drops into both eyes 3 (three) times daily as needed for dry eyes.    Historical Provider, MD  KLOR-CON M20 20 MEQ tablet TAKE 1 TABLET (20 MEQ TOTAL) BY MOUTH DAILY. 03/15/16   Larey Dresser, MD  levothyroxine (SYNTHROID, LEVOTHROID) 100 MCG tablet Take 1 tablet (100 mcg total) by mouth daily. Patient taking differently: Take 100 mcg by mouth daily before breakfast.  08/29/15   Cassandria Anger, MD  LORazepam (ATIVAN) 0.5 MG tablet Take 1 tablet (0.5 mg total) by mouth 2 (two) times daily as needed (for anxiety.). 03/15/16   Cassandria Anger, MD  oxybutynin (  DITROPAN) 5 MG tablet Take 1 tablet by mouth daily as needed for urinary retention. 03/14/16   Historical Provider, MD  pindolol (VISKEN) 5 MG tablet TAKE 1 TABLET BY MOUTH DAILY 12/08/15   Evie Lacks Plotnikov, MD  predniSONE (DELTASONE) 10 MG tablet Take 4 tabs x1 day, 3 tabs x1 day, 2 tabs x1 day, 1 tab x1 day, 0.5 tab x1 day 04/14/16   Brand Males, MD  psyllium (METAMUCIL SMOOTH TEXTURE) 28 % packet Take 1 packet by mouth daily with breakfast.    Historical Provider, MD  ranitidine (ZANTAC) 150 MG tablet Take 150 mg by mouth 3 (three) times daily. 01/01/16   Historical Provider, MD  spironolactone (ALDACTONE) 25 MG tablet Take 1 tablet (25 mg total) by mouth daily. 10/27/15   Cassandria Anger, MD   Meds Ordered and Administered this Visit  Medications - No data to display  BP 129/67   Pulse 69   Temp 98.2 F (36.8 C)   Resp 18   SpO2 98%  No data found.   Physical Exam  Constitutional: She is oriented to person, place, and time. She appears well-developed and well-nourished. No distress.  HENT:  Head: Normocephalic.  Eyes: Pupils are equal, round, and reactive to light.   Cardiovascular: Normal rate, regular rhythm and normal heart sounds.   Pulmonary/Chest: Effort normal and breath sounds normal.  Abdominal: Soft. Bowel sounds are normal. She exhibits no distension. There is no tenderness. There is no guarding.  Neurological: She is alert and oriented to person, place, and time.  Skin: Skin is warm and dry. Capillary refill takes less than 2 seconds. She is not diaphoretic. No erythema. No pallor.  Psychiatric: She has a normal mood and affect.    Urgent Care Course   Clinical Course     Procedures (including critical care time)  Labs Review Labs Reviewed - No data to display  Imaging Review No results found.   Visual Acuity Review  Right Eye Distance:   Left Eye Distance:   Bilateral Distance:    Right Eye Near:   Left Eye Near:    Bilateral Near:         MDM   1. Anal fissure    Rectal exam deferred at patients request against advice. Patient was encouraged to follow up with either her primary care provider or her gastroenterologist for further evaluation and assessment of her condition. Patient was given an rx for Diltiazem cream and encouraged to call her PCP to schedule an appointment for further care.      Barnet Glasgow, NP 04/30/16 1630

## 2016-05-01 ENCOUNTER — Other Ambulatory Visit: Payer: Self-pay | Admitting: Internal Medicine

## 2016-05-03 ENCOUNTER — Telehealth: Payer: Self-pay | Admitting: Internal Medicine

## 2016-05-03 NOTE — Telephone Encounter (Signed)
Okay to refill or should this be deferred to pcp? Please advise. Thanks, MI 

## 2016-05-03 NOTE — Telephone Encounter (Signed)
Pt has anal fissure and she had some bleeding. She went to urgent care and got her medicine. She states the medicine is helping and there is no more bleeding.   They told her to see you in about 2 days. She's feeling better and is wanting to know if you still want her to see you.  She feels she doesn't need to  Please advise

## 2016-05-03 NOTE — Telephone Encounter (Signed)
Further refills for this should come from her PCP.  Thanks

## 2016-05-03 NOTE — Telephone Encounter (Signed)
Per chart pt went to [facility] on 04/30/16 (Friday) see ED report...[name]

## 2016-05-04 NOTE — Telephone Encounter (Signed)
She can f/u with GI later if doing well now Thx

## 2016-05-05 NOTE — Telephone Encounter (Signed)
Pt aware.

## 2016-05-06 ENCOUNTER — Ambulatory Visit (INDEPENDENT_AMBULATORY_CARE_PROVIDER_SITE_OTHER): Payer: Medicare Other | Admitting: Psychiatry

## 2016-05-06 ENCOUNTER — Encounter (HOSPITAL_COMMUNITY): Payer: Self-pay | Admitting: Psychiatry

## 2016-05-06 DIAGNOSIS — F330 Major depressive disorder, recurrent, mild: Secondary | ICD-10-CM

## 2016-05-06 DIAGNOSIS — Z79899 Other long term (current) drug therapy: Secondary | ICD-10-CM

## 2016-05-06 DIAGNOSIS — Z8041 Family history of malignant neoplasm of ovary: Secondary | ICD-10-CM

## 2016-05-06 DIAGNOSIS — Z888 Allergy status to other drugs, medicaments and biological substances status: Secondary | ICD-10-CM

## 2016-05-06 DIAGNOSIS — Z823 Family history of stroke: Secondary | ICD-10-CM

## 2016-05-06 DIAGNOSIS — Z8249 Family history of ischemic heart disease and other diseases of the circulatory system: Secondary | ICD-10-CM

## 2016-05-06 DIAGNOSIS — Z9889 Other specified postprocedural states: Secondary | ICD-10-CM

## 2016-05-06 DIAGNOSIS — Z7982 Long term (current) use of aspirin: Secondary | ICD-10-CM

## 2016-05-06 MED ORDER — LORAZEPAM 0.5 MG PO TABS: ORAL_TABLET | ORAL | 1 refills | Status: DC

## 2016-05-06 MED ORDER — ESCITALOPRAM OXALATE 10 MG PO TABS: ORAL_TABLET | ORAL | 0 refills | Status: DC

## 2016-05-06 NOTE — Progress Notes (Signed)
[facility] MD/PA/NP OP Progress Note  05/06/2016 10:49 AM [name]  MRN:  [id number]  Chief Complaint:  Chief Complaint    Follow-up     Subjective:  I am going to multiple doctors.  My health is declining.  I'm concerned about my future.  I saw my primary care physician who increased my Ativan and now I'm taking twice a day as needed.  HPI: [name] came for her follow-up appointment.  She was last seen in April in 2017.  She experiencing a lot of health issues and recently she has seen GI, pulmonologist, cardiologist and have sleep study.  She admitted getting frustrated seeing multiple doctors and taking multiple medication but she has no other choice.  She endorse her Christmas was quite and she was usually by herself.  She admitted having some issues with her daughter because she did not stay and off when she was hospitalized for the procedure.  She admitted sometime feeling burdened to her family but denies any hallucination, paranoia, suicidal thoughts or homicidal thought.  She denies any crying spells but she ruminates about her living situation and lack of support.  She recently seen her primary care physician who increased her Ativan and now she is taking 0.5 mg twice a day.  However she admitted she has really take 2 Ativan every day.  She is compliant with Lexapro.  She has chronic GI symptoms which includes abdominal pain, constipation and bloating.  She was prescribed ensure because she was losing weight.  Patient denies any agitation, anger, mood swing.  She lives by herself.  Her daughter lives in [name].  Patient denies drinking alcohol or using any illegal substances.  She is seeing Dr. Cheryln Manly but admitted noncompliant since Christmas because she has no appointment.  Her appetite is fair.  Her vital signs are a.  Her energy level is okay.  Visit Diagnosis:    ICD-9-CM ICD-10-CM   1. Major depressive disorder, recurrent episode, mild (HCC) 296.31 F33.0 LORazepam (ATIVAN) 0.5 MG  tablet     escitalopram (LEXAPRO) 10 MG tablet    Past Psychiatric History: Reviewed.  Past Medical History:  Past Medical History:  Diagnosis Date  . Anxiety   . Bradycardia   . Breast cancer, left breast ([facility]) 1995  . Breast cancer, right breast ([place]) 2002  . CHF (congestive heart failure) ([place])   . Depression    Dr Cheryln Manly  . Discoid lupus    skin  . Full dentures   . GERD (gastroesophageal reflux disease)   . Glaucoma   . Gout   . Hx of echocardiogram    a.  Echocardiogram (03/2011): Mild LVH, EF 0000000, grade 1 diastolic dysfunction, mild MR, mild to moderate TR, PASP 42;  b. Echo (05/2013):  Mod LVH, EF 65-70%, no WMA, Gr 1 DD, mild MR, mod TR, PASP 39  . Hx of exercise stress test    a. ETT-echocardiogram (06/08/11): Sub-optimal exercise. Test stopped early due to dizziness and hypotension. EF 60%.  Non-diagnostic.;   b. Lexiscan Myoview (06/2013):  Diaphragmatic attenuation, no ischemia, EF 61%.  Low Risk  . Hypertension   . Hypothyroidism   . IBS (irritable bowel syndrome)   . IBS (irritable bowel syndrome)   . Incontinence   . OSA on CPAP    "quit wearing mask; retested; not enough information; to be tested again 04/08/2016"  . Osteoarthritis   . Osteopenia   . Pacemaker 2009   Brady//Chronotropic incompetence with normal pacemaker function  .  Thyroid disease   . Wears hearing aid    both ears    Past Surgical History:  Procedure Laterality Date  . BREAST BIOPSY Left 1995  . BREAST LUMPECTOMY Left 1995   axillary node dissectoin  . CARDIAC CATHETERIZATION N/A 02/13/2016   Procedure: Right Heart Cath;  Surgeon: Larey Dresser, MD;  Location: [facility];  Service: Cardiovascular;  Laterality: N/A;  . CATARACT EXTRACTION W/ INTRAOCULAR LENS  IMPLANT, BILATERAL Bilateral 06/2009 - 12.2915   left - right  . DORSAL COMPARTMENT RELEASE  2000   left  . HAMMER TOE SURGERY Left 2004  . HEMORRHOID BANDING    . INSERT / REPLACE / REMOVE PACEMAKER  2009  .  KNEE ARTHROSCOPY Left 08/29/2012   Procedure: LEFT KNEE ARTHROSCOPY ;  Surgeon: Hessie Dibble, MD;  Location: [facility];  Service: Orthopedics;  Laterality: Left;  [name] MASTECTOMY Bilateral 09/2000   nbx  . TRIGGER FINGER RELEASE Right 07/31/2013   Procedure: EXCISE MASS RIGHT INDEX A-1 PLLLEY RELEASE A-1 RIGHT INDEX;  Surgeon: Cammie Sickle., MD;  Location: [facility];  Service: Orthopedics;  Laterality: Right;  [name] VAGINAL HYSTERECTOMY      Family Psychiatric History: Reviewed.  Family History:  Family History  Problem Relation Age of Onset  . Ovarian cancer Mother   . Heart disease Father   . Stroke Sister   . Stroke Brother   . Cancer Brother   . Heart disease Brother     Social History:  Social History   Social History  . Marital status: Widowed    Spouse name: N/A  . Number of children: N/A  . Years of education: N/A   Occupational History  . retired    Social History Main Topics  . Smoking status: Never Smoker  . Smokeless tobacco: Never Used  . Alcohol use No  . Drug use: No  . Sexual activity: No   Other Topics Concern  . None   Social History Narrative   She is a vegan now 2010    Allergies:  Allergies  Allergen Reactions  . Lactase Diarrhea  . Amlodipine Besy-Benazepril Hcl Swelling  . Amlodipine Besylate Swelling  . Clonidine Hydrochloride     REACTION: tired  . Lactose Intolerance (Gi) Other (See Comments)  . Valsartan Other (See Comments)    Tongue swelling   . Verapamil     Metabolic Disorder Labs: Lab Results  Component Value Date   HGBA1C 6.0 04/10/2012   No results found for: PROLACTIN Lab Results  Component Value Date   CHOL 174 05/11/2010   TRIG 78.0 05/11/2010   HDL 52.30 05/11/2010   CHOLHDL 3 05/11/2010   VLDL 15.6 05/11/2010   LDLCALC 106 (H) 05/11/2010   LDLCALC 105 (H) 10/14/2009     Current Medications: Current Outpatient Prescriptions  Medication Sig Dispense Refill  . albuterol  (PROVENTIL HFA;VENTOLIN HFA) 108 (90 Base) MCG/ACT inhaler Inhale 2 puffs into the lungs every 6 (six) hours as needed for wheezing or shortness of breath. (Patient not taking: Reported on 04/02/2016) 1 Inhaler 6  . allopurinol (ZYLOPRIM) 300 MG tablet TAKE 1 TABLET BY MOUTH DAILY 30 tablet 1  . aspirin 81 MG chewable tablet Chew 81 mg by mouth daily.    . budesonide-formoterol (SYMBICORT) 80-4.5 MCG/ACT inhaler Inhale 2 puffs into the lungs 2 (two) times daily. 1 Inhaler 5  . cetirizine (ZYRTEC) 10 MG tablet Take 10 mg by mouth daily as needed  for allergies.    . clotrimazole (MYCELEX) 10 MG troche Take 1 tablet (10 mg total) by mouth 5 (five) times daily. 35 tablet 0  . diltiazem 2 % GEL Apply 1 application topically 2 (two) times daily. 30 g 0  . escitalopram (LEXAPRO) 10 MG tablet TAKE 1 TABLET (10 MG) BY MOUTH DAILY AT BEDTIME. 90 tablet 0  . feeding supplement, ENSURE ENLIVE, (ENSURE ENLIVE) LIQD Take 237 mLs by mouth 2 (two) times daily between meals. 237 mL 12  . fluocinonide cream (LIDEX) [id number] % Apply 1 application topically daily as needed (For spots on face/itching.).     [name] fluticasone furoate-vilanterol (BREO ELLIPTA) 200-25 MCG/INH AEPB Inhale 1 puff into the lungs daily. (Patient not taking: Reported on 04/02/2016) 60 each 5  . hydroxypropyl methylcellulose / hypromellose (ISOPTO TEARS / GONIOVISC) 2.5 % ophthalmic solution Place 1-2 drops into both eyes 3 (three) times daily as needed for dry eyes.    [name] KLOR-CON M20 20 MEQ tablet TAKE 1 TABLET (20 MEQ TOTAL) BY MOUTH DAILY. 30 tablet 3  . levothyroxine (SYNTHROID, LEVOTHROID) 100 MCG tablet Take 1 tablet (100 mcg total) by mouth daily. (Patient taking differently: Take 100 mcg by mouth daily before breakfast. ) 90 tablet 3  . LORazepam (ATIVAN) 0.5 MG tablet Take 1 tab daily and 2nd as needed 45 tablet 1  . oxybutynin (DITROPAN) 5 MG tablet Take 1 tablet by mouth daily as needed for urinary retention.    . pindolol (VISKEN) 5 MG tablet  TAKE 1 TABLET BY MOUTH DAILY 90 tablet 1  . psyllium (METAMUCIL SMOOTH TEXTURE) 28 % packet Take 1 packet by mouth daily with breakfast.    . ranitidine (ZANTAC) 150 MG tablet Take 150 mg by mouth 3 (three) times daily.    [name] spironolactone (ALDACTONE) 25 MG tablet Take 1 tablet (25 mg total) by mouth daily. 90 tablet 1   No current facility-administered medications for this visit.     Neurologic: Headache: No Seizure: No Paresthesias: No  Musculoskeletal: Strength & Muscle Tone: within normal limits Gait & Station: Difficulty walking due to pain in her joints. Patient leans: N/A  Psychiatric Specialty Exam: Review of Systems  Constitutional: Negative.   HENT: Negative.   Gastrointestinal: Positive for abdominal pain, constipation and nausea.  Musculoskeletal: Positive for back pain and joint pain.  Skin: Negative.   Neurological: Negative.     There were no vitals taken for this visit.There is no height or weight on file to calculate BMI.  General Appearance: Casual  Eye Contact:  Good  Speech:  Clear and Coherent  Volume:  Decreased  Mood:  Dysphoric  Affect:  Congruent  Thought Process:  Goal Directed  Orientation:  Full (Time, Place, and Person)  Thought Content: Rumination   Suicidal Thoughts:  No  Homicidal Thoughts:  No  Memory:  Immediate;   Good Recent;   Fair Remote;   Good  Judgement:  Good  Insight:  Good  Psychomotor Activity:  Normal  Concentration:  Concentration: Fair and Attention Span: Good  Recall:  Good  Fund of Knowledge: Good  Language: Good  Akathisia:  No  Handed:  Right  AIMS (if indicated):  None reported   Assets:  Communication Skills Desire for Improvement Housing Transportation  ADL's:  Intact  Cognition: WNL  Sleep:  Adequate    Assessment: Major depressive disorder, recurrent.  Anxiety disorder NOS.  Plan: I review her chart, current medication and recent blood work results.  Encouraged to  keep appointment with Dr.  Octavia Bruckner for counseling.  Patient is not interested to increase the Lexapro.  She feels that she is already taking multiple medication.  Continue Lexapro 10 mg daily.  Continue lorazepam 0.5 mg daily and second as needed.  Discussed benzodiazepine dependence, tolerance and withdrawal.  Patient admitted taking lorazepam 0.5 mg daily and second if she is going to the doctor's appointment.  Recommended to call us back if she has any question, concern if she feels worsening of the symptom.  Follow-up in 3 months.  Discuss safety plan that anytime having active suicidal thoughts or homicidal thoughts and she need to call 911 or go to the local emergency room.  Follow-up in 3 months.     Giovoni Bunch T., MD 05/06/2016, 10:49 AM

## 2016-05-10 ENCOUNTER — Ambulatory Visit (INDEPENDENT_AMBULATORY_CARE_PROVIDER_SITE_OTHER): Payer: Medicare Other | Admitting: Psychology

## 2016-05-10 DIAGNOSIS — F332 Major depressive disorder, recurrent severe without psychotic features: Secondary | ICD-10-CM | POA: Diagnosis not present

## 2016-05-24 ENCOUNTER — Ambulatory Visit: Payer: Medicare Other | Admitting: Psychology

## 2016-06-07 ENCOUNTER — Ambulatory Visit (INDEPENDENT_AMBULATORY_CARE_PROVIDER_SITE_OTHER): Payer: Medicare Other | Admitting: Psychology

## 2016-06-07 DIAGNOSIS — F332 Major depressive disorder, recurrent severe without psychotic features: Secondary | ICD-10-CM | POA: Diagnosis not present

## 2016-06-11 ENCOUNTER — Telehealth: Payer: Self-pay | Admitting: Cardiology

## 2016-06-11 DIAGNOSIS — G4733 Obstructive sleep apnea (adult) (pediatric): Secondary | ICD-10-CM

## 2016-06-11 NOTE — Telephone Encounter (Signed)
Order in.

## 2016-06-11 NOTE — Telephone Encounter (Signed)
Pt called to request a chin strap script per [facility].

## 2016-06-14 ENCOUNTER — Telehealth: Payer: Self-pay | Admitting: Internal Medicine

## 2016-06-14 MED ORDER — OXYBUTYNIN CHLORIDE 5 MG PO TABS: 5.0000 mg | ORAL_TABLET | Freq: Every day | ORAL | 3 refills | Status: DC | PRN

## 2016-06-14 NOTE — Telephone Encounter (Signed)
Pt called in and said that she needs a refill on her  oxybutynin (DITROPAN) 5 MG tablet [id number]    Pharmacy [facility] on [address]

## 2016-06-14 NOTE — Telephone Encounter (Signed)
Done. Thx.

## 2016-06-14 NOTE — Telephone Encounter (Signed)
Routing to dr plotnikov---i'm not showing that you prescribed this patient in this past, are you ok with refilling rx---please advise, thanks

## 2016-06-15 DIAGNOSIS — J302 Other seasonal allergic rhinitis: Secondary | ICD-10-CM | POA: Diagnosis not present

## 2016-06-15 DIAGNOSIS — J301 Allergic rhinitis due to pollen: Secondary | ICD-10-CM | POA: Diagnosis not present

## 2016-06-15 NOTE — Telephone Encounter (Signed)
Left message advising patient that rx is ready for pick up at her pharm

## 2016-06-21 ENCOUNTER — Ambulatory Visit (INDEPENDENT_AMBULATORY_CARE_PROVIDER_SITE_OTHER): Payer: Medicare Other | Admitting: Psychology

## 2016-06-21 DIAGNOSIS — F332 Major depressive disorder, recurrent severe without psychotic features: Secondary | ICD-10-CM | POA: Diagnosis not present

## 2016-06-22 ENCOUNTER — Telehealth: Payer: Self-pay | Admitting: Cardiology

## 2016-06-22 NOTE — Telephone Encounter (Signed)
New Message    Per pt wants to know if she needs to wear the c-pap? She wants to know the reasons of having to wear the c-pap... Requesting a call back from nurse

## 2016-06-28 ENCOUNTER — Telehealth: Payer: Self-pay | Admitting: *Deleted

## 2016-06-28 NOTE — Telephone Encounter (Signed)
I called the patient back and explained the reason she needed a cpap machine was because the overall apnea/hypopnea index (AHI) was 12.0 per hour she quit breathing. She verbalized understanding and thanked me for helping her understand better why she needed a cpap. She will try to continue to be compliant and willl call [facility] to get the recommended chin strap

## 2016-06-28 NOTE — Telephone Encounter (Signed)
Called the patient back to address her concerns but was unable to reach her. I called [facility] to see if maybe they could help her and found out she has been calling them as well about her machine. Merredith at [facility] said she will reach out to the patient to offer assistance again

## 2016-06-28 NOTE — Telephone Encounter (Signed)
F/U CALL:  Patient calling states that she is having issues with her CPAP machine and would like to stop using it. She would like to know if it is okay to stop using CPAP machine. Please call, thanks.

## 2016-06-29 ENCOUNTER — Ambulatory Visit (INDEPENDENT_AMBULATORY_CARE_PROVIDER_SITE_OTHER): Payer: Medicare Other | Admitting: *Deleted

## 2016-06-29 DIAGNOSIS — I495 Sick sinus syndrome: Secondary | ICD-10-CM | POA: Diagnosis not present

## 2016-06-29 NOTE — Progress Notes (Signed)
Remote pacemaker transmission.   

## 2016-06-30 ENCOUNTER — Inpatient Hospital Stay (HOSPITAL_COMMUNITY)
Admission: EM | Admit: 2016-06-30 | Discharge: 2016-07-02 | DRG: 378 | Disposition: A | Discharge status: Home / Self Care | Payer: Medicare Other | Attending: Internal Medicine | Admitting: Internal Medicine

## 2016-06-30 ENCOUNTER — Emergency Department (HOSPITAL_COMMUNITY): Payer: Medicare Other

## 2016-06-30 ENCOUNTER — Encounter (HOSPITAL_COMMUNITY): Payer: Self-pay | Admitting: *Deleted

## 2016-06-30 ENCOUNTER — Encounter: Payer: Self-pay | Admitting: Cardiology

## 2016-06-30 DIAGNOSIS — R112 Nausea with vomiting, unspecified: Secondary | ICD-10-CM

## 2016-06-30 DIAGNOSIS — J4530 Mild persistent asthma, uncomplicated: Secondary | ICD-10-CM | POA: Diagnosis present

## 2016-06-30 DIAGNOSIS — I272 Pulmonary hypertension, unspecified: Secondary | ICD-10-CM | POA: Diagnosis present

## 2016-06-30 DIAGNOSIS — I495 Sick sinus syndrome: Secondary | ICD-10-CM | POA: Diagnosis present

## 2016-06-30 DIAGNOSIS — F419 Anxiety disorder, unspecified: Secondary | ICD-10-CM | POA: Diagnosis present

## 2016-06-30 DIAGNOSIS — K580 Irritable bowel syndrome with diarrhea: Secondary | ICD-10-CM | POA: Diagnosis present

## 2016-06-30 DIAGNOSIS — Z9842 Cataract extraction status, left eye: Secondary | ICD-10-CM

## 2016-06-30 DIAGNOSIS — I10 Essential (primary) hypertension: Secondary | ICD-10-CM | POA: Diagnosis present

## 2016-06-30 DIAGNOSIS — L930 Discoid lupus erythematosus: Secondary | ICD-10-CM | POA: Diagnosis present

## 2016-06-30 DIAGNOSIS — Z91018 Allergy to other foods: Secondary | ICD-10-CM

## 2016-06-30 DIAGNOSIS — I712 Thoracic aortic aneurysm, without rupture: Secondary | ICD-10-CM | POA: Diagnosis present

## 2016-06-30 DIAGNOSIS — Z7982 Long term (current) use of aspirin: Secondary | ICD-10-CM

## 2016-06-30 DIAGNOSIS — Z7951 Long term (current) use of inhaled steroids: Secondary | ICD-10-CM

## 2016-06-30 DIAGNOSIS — Z79899 Other long term (current) drug therapy: Secondary | ICD-10-CM

## 2016-06-30 DIAGNOSIS — Z8041 Family history of malignant neoplasm of ovary: Secondary | ICD-10-CM

## 2016-06-30 DIAGNOSIS — Z888 Allergy status to other drugs, medicaments and biological substances status: Secondary | ICD-10-CM

## 2016-06-30 DIAGNOSIS — I5032 Chronic diastolic (congestive) heart failure: Secondary | ICD-10-CM | POA: Diagnosis not present

## 2016-06-30 DIAGNOSIS — N183 Chronic kidney disease, stage 3 unspecified: Secondary | ICD-10-CM | POA: Diagnosis present

## 2016-06-30 DIAGNOSIS — K5792 Diverticulitis of intestine, part unspecified, without perforation or abscess without bleeding: Secondary | ICD-10-CM | POA: Diagnosis not present

## 2016-06-30 DIAGNOSIS — Z66 Do not resuscitate: Secondary | ICD-10-CM | POA: Diagnosis present

## 2016-06-30 DIAGNOSIS — I130 Hypertensive heart and chronic kidney disease with heart failure and stage 1 through stage 4 chronic kidney disease, or unspecified chronic kidney disease: Secondary | ICD-10-CM | POA: Diagnosis present

## 2016-06-30 DIAGNOSIS — K625 Hemorrhage of anus and rectum: Secondary | ICD-10-CM | POA: Diagnosis present

## 2016-06-30 DIAGNOSIS — E739 Lactose intolerance, unspecified: Secondary | ICD-10-CM | POA: Diagnosis present

## 2016-06-30 DIAGNOSIS — M109 Gout, unspecified: Secondary | ICD-10-CM | POA: Diagnosis present

## 2016-06-30 DIAGNOSIS — K922 Gastrointestinal hemorrhage, unspecified: Secondary | ICD-10-CM | POA: Diagnosis not present

## 2016-06-30 DIAGNOSIS — Z9841 Cataract extraction status, right eye: Secondary | ICD-10-CM

## 2016-06-30 DIAGNOSIS — G4733 Obstructive sleep apnea (adult) (pediatric): Secondary | ICD-10-CM | POA: Diagnosis present

## 2016-06-30 DIAGNOSIS — Z9013 Acquired absence of bilateral breasts and nipples: Secondary | ICD-10-CM

## 2016-06-30 DIAGNOSIS — Z90710 Acquired absence of both cervix and uterus: Secondary | ICD-10-CM

## 2016-06-30 DIAGNOSIS — K5730 Diverticulosis of large intestine without perforation or abscess without bleeding: Secondary | ICD-10-CM | POA: Diagnosis not present

## 2016-06-30 DIAGNOSIS — K5733 Diverticulitis of large intestine without perforation or abscess with bleeding: Secondary | ICD-10-CM | POA: Diagnosis not present

## 2016-06-30 DIAGNOSIS — Z8249 Family history of ischemic heart disease and other diseases of the circulatory system: Secondary | ICD-10-CM

## 2016-06-30 DIAGNOSIS — F329 Major depressive disorder, single episode, unspecified: Secondary | ICD-10-CM | POA: Diagnosis present

## 2016-06-30 DIAGNOSIS — Z974 Presence of external hearing-aid: Secondary | ICD-10-CM

## 2016-06-30 DIAGNOSIS — Z853 Personal history of malignant neoplasm of breast: Secondary | ICD-10-CM

## 2016-06-30 DIAGNOSIS — Z823 Family history of stroke: Secondary | ICD-10-CM

## 2016-06-30 DIAGNOSIS — Z961 Presence of intraocular lens: Secondary | ICD-10-CM | POA: Diagnosis present

## 2016-06-30 DIAGNOSIS — K5732 Diverticulitis of large intestine without perforation or abscess without bleeding: Secondary | ICD-10-CM | POA: Diagnosis present

## 2016-06-30 DIAGNOSIS — K219 Gastro-esophageal reflux disease without esophagitis: Secondary | ICD-10-CM | POA: Diagnosis present

## 2016-06-30 DIAGNOSIS — I7121 Aneurysm of the ascending aorta, without rupture: Secondary | ICD-10-CM | POA: Diagnosis present

## 2016-06-30 DIAGNOSIS — E039 Hypothyroidism, unspecified: Secondary | ICD-10-CM | POA: Diagnosis present

## 2016-06-30 DIAGNOSIS — Z950 Presence of cardiac pacemaker: Secondary | ICD-10-CM

## 2016-06-30 DIAGNOSIS — Z972 Presence of dental prosthetic device (complete) (partial): Secondary | ICD-10-CM

## 2016-06-30 LAB — CBC
HCT: 39.2 % (ref 36.0–46.0)
HEMOGLOBIN: 12.9 g/dL (ref 12.0–15.0)
MCH: 33.1 pg (ref 26.0–34.0)
MCHC: 32.9 g/dL (ref 30.0–36.0)
MCV: 100.5 fL — AB (ref 78.0–100.0)
Platelets: 202 10*3/uL (ref 150–400)
RBC: 3.9 MIL/uL (ref 3.87–5.11)
RDW: 14 % (ref 11.5–15.5)
WBC: 4.9 10*3/uL (ref 4.0–10.5)

## 2016-06-30 LAB — COMPREHENSIVE METABOLIC PANEL
ALBUMIN: 3.6 g/dL (ref 3.5–5.0)
ALK PHOS: 63 U/L (ref 38–126)
ALT: 13 U/L — AB (ref 14–54)
ANION GAP: 8 (ref 5–15)
AST: 22 U/L (ref 15–41)
BUN: 27 mg/dL — ABNORMAL HIGH (ref 6–20)
CHLORIDE: 107 mmol/L (ref 101–111)
CO2: 26 mmol/L (ref 22–32)
CREATININE: 0.96 mg/dL (ref 0.44–1.00)
Calcium: 9.3 mg/dL (ref 8.9–10.3)
GFR calc non Af Amer: 53 mL/min — ABNORMAL LOW (ref >60)
GLUCOSE: 113 mg/dL — AB (ref 65–99)
Potassium: 4.5 mmol/L (ref 3.5–5.1)
SODIUM: 141 mmol/L (ref 135–145)
Total Bilirubin: 1 mg/dL (ref 0.3–1.2)
Total Protein: 6.9 g/dL (ref 6.5–8.1)

## 2016-06-30 LAB — POC OCCULT BLOOD, ED: Fecal Occult Bld: POSITIVE — AB

## 2016-06-30 LAB — PROTIME-INR
INR: 1.08
Prothrombin Time: 14 seconds (ref 11.4–15.2)

## 2016-06-30 LAB — CUP PACEART REMOTE DEVICE CHECK
Battery Remaining Longevity: 27 mo
Battery Voltage: 2.74 V
Date Time Interrogation Session: 20180306153748
Implantable Lead Implant Date: 20090527
Implantable Lead Location: 753859
Implantable Pulse Generator Implant Date: 20090527
Lead Channel Pacing Threshold Pulse Width: 0.4 ms
Lead Channel Setting Pacing Amplitude: 2 V
Lead Channel Setting Pacing Pulse Width: 0.4 ms
Lead Channel Setting Sensing Sensitivity: 2 mV
MDC IDC LEAD IMPLANT DT: 20090527
MDC IDC LEAD LOCATION: 753860
MDC IDC MSMT BATTERY IMPEDANCE: 2357 Ohm
MDC IDC MSMT LEADCHNL RA IMPEDANCE VALUE: 523 Ohm
MDC IDC MSMT LEADCHNL RA PACING THRESHOLD AMPLITUDE: 0.625 V
MDC IDC MSMT LEADCHNL RA PACING THRESHOLD PULSEWIDTH: 0.4 ms
MDC IDC MSMT LEADCHNL RV IMPEDANCE VALUE: 512 Ohm
MDC IDC MSMT LEADCHNL RV PACING THRESHOLD AMPLITUDE: 0.75 V
MDC IDC SET LEADCHNL RV PACING AMPLITUDE: 2.5 V
MDC IDC STAT BRADY AP VP PERCENT: 4 %
MDC IDC STAT BRADY AP VS PERCENT: 86 %
MDC IDC STAT BRADY AS VP PERCENT: 1 %
MDC IDC STAT BRADY AS VS PERCENT: 9 %

## 2016-06-30 MED ORDER — IOPAMIDOL (ISOVUE-300) INJECTION 61%
INTRAVENOUS | Status: AC
  Administered 2016-06-30: 100 mL
  Filled 2016-06-30: qty 100

## 2016-06-30 MED ORDER — CIPROFLOXACIN IN D5W 400 MG/200ML IV SOLN
400.0000 mg | Freq: Once | INTRAVENOUS | Status: AC
  Administered 2016-06-30: 400 mg via INTRAVENOUS
  Filled 2016-06-30: qty 200

## 2016-06-30 MED ORDER — METRONIDAZOLE IN NACL 5-0.79 MG/ML-% IV SOLN
500.0000 mg | Freq: Once | INTRAVENOUS | Status: AC
  Administered 2016-06-30: 500 mg via INTRAVENOUS
  Filled 2016-06-30: qty 100

## 2016-06-30 MED ORDER — SODIUM CHLORIDE 0.9 % IV BOLUS (SEPSIS)
1000.0000 mL | Freq: Once | INTRAVENOUS | Status: AC
  Administered 2016-06-30: 1000 mL via INTRAVENOUS

## 2016-06-30 NOTE — ED Notes (Signed)
Pt to CT

## 2016-06-30 NOTE — ED Provider Notes (Signed)
[facility] DEPT Provider Note   CSN: [id number] Arrival date & time: 06/30/16  1518     History   Chief Complaint Chief Complaint  Patient presents with  . Rectal Bleeding    HPI [name] is a 83 y.o. female.  HPI 83 year old female with past medical history of CHF, breast cancer, history of anal fissure secondary to IBS who presents with rectal bleeding. Patient states she woke up this morning and had a large, grossly bloody bowel movement mixed in with maroon colored stool and clots. Since then, she has had persistent blood in her stools. She has a history of anal fissures but states she had no pain prior to or during the episode today. She denies any associated lightheadedness, dizziness, chest pain, or shortness of breath. She has a history of diverticulosis but has never had a diverticular bleed. She does have some mild aching, throbbing, lower abdominal pain, worse in the left lower quadrant. Denies any nausea or vomiting. No fevers. She is on an aspirin daily but denies any other blood thinner use. Denies any regular NSAID use.   Past Medical History:  Diagnosis Date  . Anxiety   . Bradycardia   . Breast cancer, left breast ([place]) 1995  . Breast cancer, right breast ([place]) 2002  . CHF (congestive heart failure) ([place])   . Depression    Dr Cheryln Manly  . Discoid lupus    skin  . Full dentures   . GERD (gastroesophageal reflux disease)   . Glaucoma   . Gout   . Hx of echocardiogram    a.  Echocardiogram (03/2011): Mild LVH, EF 61-60%, grade 1 diastolic dysfunction, mild MR, mild to moderate TR, PASP 42;  b. Echo (05/2013):  Mod LVH, EF 65-70%, no WMA, Gr 1 DD, mild MR, mod TR, PASP 39  . Hx of exercise stress test    a. ETT-echocardiogram (06/08/11): Sub-optimal exercise. Test stopped early due to dizziness and hypotension. EF 60%.  Non-diagnostic.;   b. Lexiscan Myoview (06/2013):  Diaphragmatic attenuation, no ischemia, EF 61%.  Low Risk  . Hypertension   .  Hypothyroidism   . IBS (irritable bowel syndrome)   . IBS (irritable bowel syndrome)   . Incontinence   . OSA on CPAP    "quit wearing mask; retested; not enough information; to be tested again 04/08/2016"  . Osteoarthritis   . Osteopenia   . Pacemaker 2009   Brady//Chronotropic incompetence with normal pacemaker function  . Thyroid disease   . Wears hearing aid    both ears    Patient Active Problem List   Diagnosis Date Noted  . Rectal bleeding 06/30/2016  . Impacted cerumen of right ear 03/26/2016  . Syncope 03/22/2016  . Benign essential HTN 03/22/2016  . CAD (coronary artery disease) 03/22/2016  . CKD (chronic kidney disease) stage 3, GFR 30-59 ml/min 03/22/2016  . Depression 03/22/2016  . Exertional dyspnea 11/04/2015  . Ascending aortic aneurysm ([place]) 11/04/2015  . 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 Arthralgia 10/10/2015  . Pulmonary hypertension 09/30/2015  . Mild persistent asthma 09/30/2015  . Irritable larynx 09/04/2015  . Rotator cuff disorder 03/06/2015  . OCD (obsessive compulsive disorder) 04/03/2013  . IBS (irritable bowel syndrome) 07/03/2012  . Dysphagia 04/13/2012  . Denture irritation 12/20/2011  . Irritable bladder 12/20/2011  . Knee pain, bilateral 12/20/2011  . Hearing loss 10/11/2011  . Pacemaker-Medtronic 05/18/2011  . Gout, unspecified 04/30/2010  . Anal fissure 02/03/2010  . Atrial fibrillation ([place]) 08/26/2009  . Sinoatrial  node dysfunction ([place]) 09/17/2008  . HYPOTENSION, ORTHOSTATIC 01/29/2008  . Microscopic hematuria 08/21/2007  . Pain in joint, pelvic region and thigh 08/01/2007  . VITAMIN D DEFICIENCY 05/23/2007  . Anxiety state 05/23/2007  . Hypothyroidism 03/22/2007  . GERD 03/22/2007  . Osteoarthritis 03/22/2007  . CFS (chronic fatigue syndrome) 11/17/2006  . Hypertensive heart disease 11/17/2006  . ALLERGIC RHINITIS 11/17/2006  . OSTEOPOROSIS 11/17/2006  . BREAST CANCER, HX OF 11/17/2006    Past Surgical History:  Procedure  Laterality Date  . BREAST BIOPSY Left 1995  . BREAST LUMPECTOMY Left 1995   axillary node dissectoin  . CARDIAC CATHETERIZATION N/A 02/13/2016   Procedure: Right Heart Cath;  Surgeon: Larey Dresser, MD;  Location: [facility];  Service: Cardiovascular;  Laterality: N/A;  . CATARACT EXTRACTION W/ INTRAOCULAR LENS  IMPLANT, BILATERAL Bilateral 06/2009 - 12.2915   left - right  . DORSAL COMPARTMENT RELEASE  2000   left  . HAMMER TOE SURGERY Left 2004  . HEMORRHOID BANDING    . INSERT / REPLACE / REMOVE PACEMAKER  2009  . KNEE ARTHROSCOPY Left 08/29/2012   Procedure: LEFT KNEE ARTHROSCOPY ;  Surgeon: Hessie Dibble, MD;  Location: [facility];  Service: Orthopedics;  Laterality: Left;  [name] MASTECTOMY Bilateral 09/2000   nbx  . TRIGGER FINGER RELEASE Right 07/31/2013   Procedure: EXCISE MASS RIGHT INDEX A-1 PLLLEY RELEASE A-1 RIGHT INDEX;  Surgeon: Cammie Sickle., MD;  Location: [facility];  Service: Orthopedics;  Laterality: Right;  [name] VAGINAL HYSTERECTOMY      OB History    No data available       Home Medications    Prior to Admission medications   Medication Sig Start Date End Date Taking? Authorizing Provider  albuterol (PROVENTIL HFA;VENTOLIN HFA) 108 (90 Base) MCG/ACT inhaler Inhale 2 puffs into the lungs every 6 (six) hours as needed for wheezing or shortness of breath. 09/04/15  Yes Brand Males, MD  allopurinol (ZYLOPRIM) 300 MG tablet TAKE 1 TABLET BY MOUTH DAILY 03/01/16  Yes Deboraha Sprang, MD  cetirizine (ZYRTEC) 10 MG tablet Take 10 mg by mouth daily as needed for allergies.   Yes Historical Provider, MD  escitalopram (LEXAPRO) 10 MG tablet TAKE 1 TABLET (10 MG) BY MOUTH DAILY AT BEDTIME. 05/06/16  Yes Kathlee Nations, MD  feeding supplement, ENSURE ENLIVE, (ENSURE ENLIVE) LIQD Take 237 mLs by mouth 2 (two) times daily between meals. 03/23/16  Yes Reyne Dumas, MD  fluocinonide cream (LIDEX) 4.74 % Apply 1 application topically daily  as needed (For spots on face/itching.).  09/11/15  Yes Historical Provider, MD  fluticasone furoate-vilanterol (BREO ELLIPTA) 200-25 MCG/INH AEPB Inhale 1 puff into the lungs daily. 09/04/15  Yes Brand Males, MD  KLOR-CON M20 20 MEQ tablet TAKE 1 TABLET (20 MEQ TOTAL) BY MOUTH DAILY. 03/15/16  Yes Larey Dresser, MD  levothyroxine (SYNTHROID, LEVOTHROID) 100 MCG tablet Take 1 tablet (100 mcg total) by mouth daily. Patient taking differently: Take 100 mcg by mouth daily before breakfast.  08/29/15  Yes Aleksei Plotnikov V, MD  LORazepam (ATIVAN) 0.5 MG tablet Take 1 tab daily and 2nd as needed Patient taking differently: Take 0.5 mg by mouth every 6 (six) hours as needed for anxiety. Take 1 tab daily and 2nd as needed 05/06/16  Yes Kathlee Nations, MD  oxybutynin (DITROPAN) 5 MG tablet Take 1 tablet (5 mg total) by mouth daily as needed. 06/14/16  Yes Aleksei Plotnikov  V, MD  pindolol (VISKEN) 5 MG tablet TAKE 1 TABLET BY MOUTH DAILY 12/08/15  Yes Aleksei Plotnikov V, MD  psyllium (METAMUCIL SMOOTH TEXTURE) 28 % packet Take 1 packet by mouth daily with breakfast.   Yes Historical Provider, MD  ranitidine (ZANTAC) 150 MG tablet Take 150 mg by mouth 3 (three) times daily. 01/01/16  Yes Historical Provider, MD  spironolactone (ALDACTONE) 25 MG tablet Take 1 tablet (25 mg total) by mouth daily. 10/27/15  Yes Aleksei Plotnikov V, MD  budesonide-formoterol (SYMBICORT) 80-4.5 MCG/ACT inhaler Inhale 2 puffs into the lungs 2 (two) times daily. Patient not taking: Reported on 06/30/2016 04/07/16   Brand Males, MD  clotrimazole (MYCELEX) 10 MG troche Take 1 tablet (10 mg total) by mouth 5 (five) times daily. Patient not taking: Reported on 06/30/2016 04/21/16   Tammy S Parrett, NP  diltiazem 2 % GEL Apply 1 application topically 2 (two) times daily. Patient not taking: Reported on 06/30/2016 04/30/16   Barnet Glasgow, NP    Family History Family History  Problem Relation Age of Onset  . Ovarian cancer Mother   .  Heart disease Father   . Stroke Sister   . Stroke Brother   . Cancer Brother   . Heart disease Brother     Social History Social History  Substance Use Topics  . Smoking status: Never Smoker  . Smokeless tobacco: Never Used  . Alcohol use No     Allergies   Lactase; Amlodipine besy-benazepril hcl; Amlodipine besylate; Clonidine hydrochloride; Lactose intolerance (gi); Valsartan; and Verapamil   Review of Systems Review of Systems  Constitutional: Positive for fatigue. Negative for chills and fever.  HENT: Negative for congestion and rhinorrhea.   Eyes: Negative for visual disturbance.  Respiratory: Negative for cough, shortness of breath and wheezing.   Cardiovascular: Negative for chest pain and leg swelling.  Gastrointestinal: Positive for abdominal pain, blood in stool and constipation. Negative for diarrhea, nausea and vomiting.  Genitourinary: Negative for dysuria and flank pain.  Musculoskeletal: Negative for neck pain and neck stiffness.  Skin: Negative for rash and wound.  Allergic/Immunologic: Negative for immunocompromised state.  Neurological: Negative for syncope, weakness and headaches.  All other systems reviewed and are negative.    Physical Exam Updated Vital Signs BP (!) 161/76 (BP Location: Right Arm)   Pulse 60   Temp 98.3 F (36.8 C) (Oral)   Resp 18   Ht 5\' 4"  (1.626 m)   Wt 164 lb (74.4 kg)   SpO2 98%   BMI 28.15 kg/m   Physical Exam  Constitutional: She is oriented to person, place, and time. She appears well-developed and well-nourished. No distress.  HENT:  Head: Normocephalic and atraumatic.  Eyes: Conjunctivae are normal.  Neck: Neck supple.  Cardiovascular: Normal rate, regular rhythm and normal heart sounds.  Exam reveals no friction rub.   No murmur heard. Pulmonary/Chest: Effort normal and breath sounds normal. No respiratory distress. She has no wheezes. She has no rales.  Abdominal: Soft. Normal appearance. She exhibits no  distension. There is tenderness in the suprapubic area and left lower quadrant. There is no rigidity, no rebound and no guarding.  Genitourinary:  Genitourinary Comments: Rectal tone normal. There is a large amount of maroon-colored stool in the rectal vault with mixed bright red blood. No active external bleeding. No anal fissures.  Musculoskeletal: She exhibits no edema.  Neurological: She is alert and oriented to person, place, and time. She exhibits normal muscle tone.  Skin: Skin is  warm. Capillary refill takes less than 2 seconds.  Psychiatric: She has a normal mood and affect.  Nursing note and vitals reviewed.    ED Treatments / Results  Labs (all labs ordered are listed, but only abnormal results are displayed) Labs Reviewed  COMPREHENSIVE METABOLIC PANEL - Abnormal; Notable for the following:       Result Value   Glucose, Bld 113 (*)    BUN 27 (*)    ALT 13 (*)    GFR calc non Af Amer 53 (*)    All other components within normal limits  CBC - Abnormal; Notable for the following:    MCV 100.5 (*)    All other components within normal limits  POC OCCULT BLOOD, ED - Abnormal; Notable for the following:    Fecal Occult Bld POSITIVE (*)    All other components within normal limits  CULTURE, BLOOD (ROUTINE X 2)  CULTURE, BLOOD (ROUTINE X 2)  PROTIME-INR    EKG  EKG Interpretation None       Radiology Ct Abdomen Pelvis W Contrast  Result Date: 06/30/2016 CLINICAL DATA:  Rectal bleeding. Left lower quadrant abdominal pain. Symptoms became worse today. Personal history of anal fissures and diverticulitis. IBS. Personal history of breast cancer. EXAM: CT ABDOMEN AND PELVIS WITH CONTRAST TECHNIQUE: Multidetector CT imaging of the abdomen and pelvis was performed using the standard protocol following bolus administration of intravenous contrast. CONTRAST:  172mL ISOVUE-300 IOPAMIDOL (ISOVUE-300) INJECTION 61% COMPARISON:  CT of the chest, abdomen, and pelvis 02/11/2009.  FINDINGS: Lower chest: Lung bases are clear without focal nodule, mass, or airspace disease. The heart is mildly enlarged. There is no edema or effusion. Pacing wires are in place. Hepatobiliary: No focal intrahepatic biliary dilation is present. Multiple gallstones are noted. There is no inflammatory change about the gallbladder. Pancreas: No significant inflammatory changes are present. There is mild dilation of the pancreatic duct. No obstructing mass lesion is evident. Spleen: Normal in size without focal abnormality. Adrenals/Urinary Tract: Adrenal glands are normal bilaterally. A 2.5 cm cyst is present at the lower pole of the left kidney. The focally tear present. There are no stones. The urinary bladder is within normal limits. Stomach/Bowel: A small hiatal hernia is present. The stomach and duodenum are otherwise unremarkable. Small bowel is within normal limits. Appendix is visualized and normal. The ascending and transverse colon are within normal limits. Diverticular changes are present in the descending and sigmoid colon. There is focal wall thickening inflammatory change in the distal sigmoid colon. The rectum is unremarkable. No anal fissures are evident. Vascular/Lymphatic: Atherosclerotic calcifications are present in the aorta and branch vessels without aneurysm. No significant retroperitoneal or pelvic adenopathy is present. Reproductive: Hysterectomy is noted. The ovaries are not discretely visualized and may be surgically absent as well. Other: No significant free fluid or free air is present. Musculoskeletal: Degenerative endplate changes and facet arthropathy are present in the lower lumbar spine. No focal lytic or blastic lesions are present. Vertebral body heights are maintained. Bony pelvis is intact. The hips are located. Degenerative changes are noted at the SI joints bilaterally. IMPRESSION: 1. Inflammatory changes of the sigmoid colon with diverticula compatible with acute  diverticulitis. There is no abscess or free air. No complicating features are evident. 2. Additional diverticular present within the remaining sigmoid colon and descending colon without other inflammatory change. 3. Small hiatal hernia. 4. Mild atherosclerotic changes within the aorta and branch vessels. 5. Mild cardiomegaly without failure. Electronically Signed  By: San Morelle M.D.   On: 06/30/2016 20:22    Procedures Procedures (including critical care time)  Medications Ordered in ED Medications  sodium chloride 0.9 % bolus 1,000 mL (0 mLs Intravenous Stopped 06/30/16 2058)  iopamidol (ISOVUE-300) 61 % injection (100 mLs  Contrast Given 06/30/16 1942)  ciprofloxacin (CIPRO) IVPB 400 mg (0 mg Intravenous Stopped 06/30/16 2259)  metroNIDAZOLE (FLAGYL) IVPB 500 mg (0 mg Intravenous Stopped 06/30/16 2259)     Initial Impression / Assessment and Plan / ED Course  I have reviewed the triage vital signs and the nursing notes.  Pertinent labs & imaging results that were available during my care of the patient were reviewed by me and considered in my medical decision making (see chart for details).    83 year old female here with lower abdominal pain and rectal bleeding. Exam shows grossly bloody stool with maroon-colored stool mixed in with large clots and bright red blood. There are no apparent active anal fissures or hemorrhoids. Patient has stable hemoglobin but CT scan concerning for acute diverticulitis. Given her age and diverticulitis with large volume bleeding, will admit for trending of hemoglobin and antibiotics.  Final Clinical Impressions(s) / ED Diagnoses   Final diagnoses:  Acute diverticulitis  Lower GI bleed    New Prescriptions Current Discharge Medication List       Duffy Bruce, MD 06/30/16 2357

## 2016-06-30 NOTE — ED Notes (Addendum)
Unsuccessful attempt to draw blood for cultures. Phlebotomy called and will attempt to draw blood for cultures.

## 2016-06-30 NOTE — ED Notes (Signed)
Pt returned from CT °

## 2016-06-30 NOTE — ED Triage Notes (Addendum)
Pt reports having bright red rectal bleeding and mild rectal pain. Hx of anal fissures. No acute distress noted at triage.

## 2016-06-30 NOTE — ED Notes (Signed)
Provider at bedside discussing plan of care with pt and family.

## 2016-06-30 NOTE — ED Notes (Signed)
Patient ambulated to BR with slow, steady gait.

## 2016-06-30 NOTE — ED Notes (Signed)
Phlebotomy at bedside.

## 2016-07-01 ENCOUNTER — Encounter (HOSPITAL_COMMUNITY): Payer: Self-pay | Admitting: Internal Medicine

## 2016-07-01 ENCOUNTER — Observation Stay (HOSPITAL_COMMUNITY): Payer: Medicare Other

## 2016-07-01 DIAGNOSIS — I272 Pulmonary hypertension, unspecified: Secondary | ICD-10-CM | POA: Diagnosis present

## 2016-07-01 DIAGNOSIS — Z90710 Acquired absence of both cervix and uterus: Secondary | ICD-10-CM | POA: Diagnosis not present

## 2016-07-01 DIAGNOSIS — Z66 Do not resuscitate: Secondary | ICD-10-CM | POA: Diagnosis present

## 2016-07-01 DIAGNOSIS — K5792 Diverticulitis of intestine, part unspecified, without perforation or abscess without bleeding: Secondary | ICD-10-CM | POA: Diagnosis not present

## 2016-07-01 DIAGNOSIS — I5032 Chronic diastolic (congestive) heart failure: Secondary | ICD-10-CM | POA: Diagnosis present

## 2016-07-01 DIAGNOSIS — K625 Hemorrhage of anus and rectum: Secondary | ICD-10-CM

## 2016-07-01 DIAGNOSIS — Z8041 Family history of malignant neoplasm of ovary: Secondary | ICD-10-CM | POA: Diagnosis not present

## 2016-07-01 DIAGNOSIS — F419 Anxiety disorder, unspecified: Secondary | ICD-10-CM | POA: Diagnosis present

## 2016-07-01 DIAGNOSIS — N183 Chronic kidney disease, stage 3 (moderate): Secondary | ICD-10-CM | POA: Diagnosis present

## 2016-07-01 DIAGNOSIS — Z823 Family history of stroke: Secondary | ICD-10-CM | POA: Diagnosis not present

## 2016-07-01 DIAGNOSIS — K5732 Diverticulitis of large intestine without perforation or abscess without bleeding: Secondary | ICD-10-CM | POA: Diagnosis present

## 2016-07-01 DIAGNOSIS — Z972 Presence of dental prosthetic device (complete) (partial): Secondary | ICD-10-CM | POA: Diagnosis not present

## 2016-07-01 DIAGNOSIS — R112 Nausea with vomiting, unspecified: Secondary | ICD-10-CM | POA: Diagnosis not present

## 2016-07-01 DIAGNOSIS — G4733 Obstructive sleep apnea (adult) (pediatric): Secondary | ICD-10-CM | POA: Diagnosis present

## 2016-07-01 DIAGNOSIS — M109 Gout, unspecified: Secondary | ICD-10-CM | POA: Diagnosis present

## 2016-07-01 DIAGNOSIS — F329 Major depressive disorder, single episode, unspecified: Secondary | ICD-10-CM | POA: Diagnosis present

## 2016-07-01 DIAGNOSIS — K5733 Diverticulitis of large intestine without perforation or abscess with bleeding: Secondary | ICD-10-CM | POA: Diagnosis present

## 2016-07-01 DIAGNOSIS — I495 Sick sinus syndrome: Secondary | ICD-10-CM | POA: Diagnosis present

## 2016-07-01 DIAGNOSIS — Z8249 Family history of ischemic heart disease and other diseases of the circulatory system: Secondary | ICD-10-CM | POA: Diagnosis not present

## 2016-07-01 DIAGNOSIS — Z950 Presence of cardiac pacemaker: Secondary | ICD-10-CM | POA: Diagnosis not present

## 2016-07-01 DIAGNOSIS — I130 Hypertensive heart and chronic kidney disease with heart failure and stage 1 through stage 4 chronic kidney disease, or unspecified chronic kidney disease: Secondary | ICD-10-CM | POA: Diagnosis present

## 2016-07-01 DIAGNOSIS — Z974 Presence of external hearing-aid: Secondary | ICD-10-CM | POA: Diagnosis not present

## 2016-07-01 DIAGNOSIS — K219 Gastro-esophageal reflux disease without esophagitis: Secondary | ICD-10-CM | POA: Diagnosis present

## 2016-07-01 DIAGNOSIS — E039 Hypothyroidism, unspecified: Secondary | ICD-10-CM | POA: Diagnosis present

## 2016-07-01 DIAGNOSIS — Z853 Personal history of malignant neoplasm of breast: Secondary | ICD-10-CM | POA: Diagnosis not present

## 2016-07-01 DIAGNOSIS — I712 Thoracic aortic aneurysm, without rupture: Secondary | ICD-10-CM | POA: Diagnosis present

## 2016-07-01 DIAGNOSIS — L930 Discoid lupus erythematosus: Secondary | ICD-10-CM | POA: Diagnosis present

## 2016-07-01 DIAGNOSIS — J4530 Mild persistent asthma, uncomplicated: Secondary | ICD-10-CM | POA: Diagnosis present

## 2016-07-01 LAB — HEMOGLOBIN AND HEMATOCRIT, BLOOD
HCT: 38 % (ref 36.0–46.0)
HEMATOCRIT: 38.8 % (ref 36.0–46.0)
HEMOGLOBIN: 12.3 g/dL (ref 12.0–15.0)
Hemoglobin: 12.9 g/dL (ref 12.0–15.0)

## 2016-07-01 LAB — CBC
HCT: 36.6 % (ref 36.0–46.0)
HEMATOCRIT: 36.3 % (ref 36.0–46.0)
HEMOGLOBIN: 11.8 g/dL — AB (ref 12.0–15.0)
HEMOGLOBIN: 11.8 g/dL — AB (ref 12.0–15.0)
MCH: 32.9 pg (ref 26.0–34.0)
MCH: 32.5 pg (ref 26.0–34.0)
MCHC: 32.5 g/dL (ref 30.0–36.0)
MCHC: 32.2 g/dL (ref 30.0–36.0)
MCV: 101.1 fL — ABNORMAL HIGH (ref 78.0–100.0)
MCV: 100.8 fL — AB (ref 78.0–100.0)
PLATELETS: 166 10*3/uL (ref 150–400)
Platelets: 167 10*3/uL (ref 150–400)
RBC: 3.59 MIL/uL — AB (ref 3.87–5.11)
RBC: 3.63 MIL/uL — AB (ref 3.87–5.11)
RDW: 14.2 % (ref 11.5–15.5)
RDW: 14 % (ref 11.5–15.5)
WBC: 5.5 10*3/uL (ref 4.0–10.5)
WBC: 4.4 10*3/uL (ref 4.0–10.5)

## 2016-07-01 LAB — BASIC METABOLIC PANEL
ANION GAP: 7 (ref 5–15)
BUN: 22 mg/dL — AB (ref 6–20)
CHLORIDE: 109 mmol/L (ref 101–111)
CO2: 21 mmol/L — ABNORMAL LOW (ref 22–32)
Calcium: 8.4 mg/dL — ABNORMAL LOW (ref 8.9–10.3)
Creatinine, Ser: 0.83 mg/dL (ref 0.44–1.00)
GFR calc Af Amer: >60 mL/min (ref >60)
GLUCOSE: 83 mg/dL (ref 65–99)
POTASSIUM: 3.8 mmol/L (ref 3.5–5.1)
Sodium: 137 mmol/L (ref 135–145)

## 2016-07-01 LAB — TYPE AND SCREEN
ABO/RH(D): A POS
Antibody Screen: NEGATIVE

## 2016-07-01 LAB — ABO/RH: ABO/RH(D): A POS

## 2016-07-01 MED ORDER — LEVOTHYROXINE SODIUM 100 MCG PO TABS
100.0000 ug | ORAL_TABLET | Freq: Every day | ORAL | Status: DC
  Administered 2016-07-01 – 2016-07-02 (×2): 100 ug via ORAL
  Filled 2016-07-01 (×2): qty 1

## 2016-07-01 MED ORDER — FAMOTIDINE 20 MG PO TABS
20.0000 mg | ORAL_TABLET | Freq: Two times a day (BID) | ORAL | Status: DC
  Administered 2016-07-01 – 2016-07-02 (×4): 20 mg via ORAL
  Filled 2016-07-01 (×4): qty 1

## 2016-07-01 MED ORDER — ACETAMINOPHEN 325 MG PO TABS: 650.0000 mg | ORAL_TABLET | Freq: Four times a day (QID) | ORAL | Status: DC | PRN

## 2016-07-01 MED ORDER — ACETAMINOPHEN 650 MG RE SUPP: 650.0000 mg | Freq: Four times a day (QID) | RECTAL | Status: DC | PRN

## 2016-07-01 MED ORDER — OXYBUTYNIN CHLORIDE 5 MG PO TABS
5.0000 mg | ORAL_TABLET | Freq: Every day | ORAL | Status: DC
  Administered 2016-07-01 (×2): 5 mg via ORAL
  Filled 2016-07-01 (×2): qty 1

## 2016-07-01 MED ORDER — POTASSIUM CHLORIDE CRYS ER 20 MEQ PO TBCR
20.0000 meq | EXTENDED_RELEASE_TABLET | Freq: Every day | ORAL | Status: DC
  Administered 2016-07-01 – 2016-07-02 (×2): 20 meq via ORAL
  Filled 2016-07-01 (×2): qty 1

## 2016-07-01 MED ORDER — ONDANSETRON HCL 4 MG/2ML IJ SOLN
4.0000 mg | Freq: Four times a day (QID) | INTRAMUSCULAR | Status: DC | PRN
  Administered 2016-07-02: 4 mg via INTRAVENOUS
  Filled 2016-07-01 (×2): qty 2

## 2016-07-01 MED ORDER — ALBUTEROL SULFATE (2.5 MG/3ML) 0.083% IN NEBU: 2.5000 mg | INHALATION_SOLUTION | Freq: Four times a day (QID) | RESPIRATORY_TRACT | Status: DC | PRN

## 2016-07-01 MED ORDER — FLUTICASONE FUROATE-VILANTEROL 200-25 MCG/INH IN AEPB
1.0000 | INHALATION_SPRAY | Freq: Every day | RESPIRATORY_TRACT | Status: DC
Start: 2016-07-01 — End: 2016-07-02
  Filled 2016-07-01: qty 28

## 2016-07-01 MED ORDER — METRONIDAZOLE IN NACL 5-0.79 MG/ML-% IV SOLN
500.0000 mg | Freq: Three times a day (TID) | INTRAVENOUS | Status: DC
  Administered 2016-07-01 – 2016-07-02 (×4): 500 mg via INTRAVENOUS
  Filled 2016-07-01 (×4): qty 100

## 2016-07-01 MED ORDER — LORAZEPAM 0.5 MG PO TABS
0.5000 mg | ORAL_TABLET | Freq: Four times a day (QID) | ORAL | Status: DC | PRN
Start: 2016-07-01 — End: 2016-07-02
  Administered 2016-07-01: 0.5 mg via ORAL
  Filled 2016-07-01: qty 1

## 2016-07-01 MED ORDER — PINDOLOL 5 MG PO TABS
5.0000 mg | ORAL_TABLET | Freq: Every day | ORAL | Status: DC
  Administered 2016-07-01 – 2016-07-02 (×2): 5 mg via ORAL
  Filled 2016-07-01 (×2): qty 1

## 2016-07-01 MED ORDER — CIPROFLOXACIN IN D5W 400 MG/200ML IV SOLN
400.0000 mg | Freq: Two times a day (BID) | INTRAVENOUS | Status: DC
  Administered 2016-07-01 – 2016-07-02 (×3): 400 mg via INTRAVENOUS
  Filled 2016-07-01 (×3): qty 200

## 2016-07-01 MED ORDER — ESCITALOPRAM OXALATE 10 MG PO TABS
10.0000 mg | ORAL_TABLET | Freq: Every day | ORAL | Status: DC
  Administered 2016-07-01 (×2): 10 mg via ORAL
  Filled 2016-07-01 (×2): qty 1

## 2016-07-01 MED ORDER — ALLOPURINOL 300 MG PO TABS
300.0000 mg | ORAL_TABLET | Freq: Every day | ORAL | Status: DC
  Administered 2016-07-01 – 2016-07-02 (×2): 300 mg via ORAL
  Filled 2016-07-01 (×2): qty 1

## 2016-07-01 MED ORDER — POLYETHYLENE GLYCOL 3350 17 G PO PACK
17.0000 g | PACK | Freq: Three times a day (TID) | ORAL | Status: DC
  Administered 2016-07-01: 17 g via ORAL
  Filled 2016-07-01: qty 1

## 2016-07-01 NOTE — Progress Notes (Signed)
Pt refusing Breo DPI. Says she does not take at home and wants it removed from med list.

## 2016-07-01 NOTE — Progress Notes (Signed)
Pharmacy Antibiotic Note  [name] is a 83 y.o. female admitted on 06/30/2016 with intra-abd infection (diverticulitis).  Pharmacy has been consulted for Ciprofloxacin dosing. Pt also on Flagyl.  Plan: Ciprofloxacin 400mg  po q12h Will f/u micro data, renal function, and pt's clinical condition   Height: 5\' 4"  (162.6 cm) Weight: 164 lb (74.4 kg) IBW/kg (Calculated) : 54.7  Temp (24hrs), Avg:98.3 F (36.8 C), Min:98.3 F (36.8 C), Max:98.3 F (36.8 C)   Recent Labs Lab 06/30/16 1535  WBC 4.9  CREATININE 0.96    Estimated Creatinine Clearance: 43.1 mL/min (by C-G formula based on SCr of 0.96 mg/dL).    Allergies  Allergen Reactions  . Lactase Diarrhea  . Amlodipine Besy-Benazepril Hcl Swelling  . Amlodipine Besylate Swelling  . Clonidine Hydrochloride     REACTION: tired  . Lactose Intolerance (Gi) Other (See Comments)  . Valsartan Other (See Comments)    Tongue swelling   . Verapamil     Antimicrobials this admission: 3/8 Cipro >>  3/8 Flagyl >>   Microbiology results: 3/7 BCx x2:   Thank you for allowing pharmacy to be a part of this patient's care.  Sherlon Handing, PharmD, BCPS Clinical pharmacist, pager [phone] 07/01/2016 1:12 AM

## 2016-07-01 NOTE — Progress Notes (Signed)
Patient did not bring her mask from home, stated she cant wear our mask. Patient refused to wear CPAP tonight and will bring her mask.

## 2016-07-01 NOTE — Progress Notes (Signed)
Admitted from ED, alert and oriented x 4, no complaints of abdominal pain, no bloody stools reported since arrival to the hospital.  Noted to have ongoing IV antibiotics.  Cane  at bedside. Oriented to unit's routines and initial plan of care.  Report received from [place], [place].  Will continue to monitor and evaluate.

## 2016-07-01 NOTE — Progress Notes (Signed)
Pt vomited a small amount after she ate lunch.  MD paged.

## 2016-07-01 NOTE — Progress Notes (Signed)
Patient up to bathroom when mobility tech and RN walked in.  RN did look at stool, formed stool with bright red water in toilet.  Patient says this is not as much blood as she was having at home.  Primary nurse notified.

## 2016-07-01 NOTE — Progress Notes (Signed)
Cpap setup for patient at bedside with nasal mask, as she wears at home. Pt states mask does not fit comfortably, even after adjustments. Encouraged patient to have family bring in her mask from home so that she may wear cpap tomorrow night. Cpap remains on standby at bedside.

## 2016-07-01 NOTE — Consult Note (Signed)
[facility] CONSULT Reason for consult: active diverticulitis and rectal bleeding Referring Physician: [facility] hospitalist. PCP: Dr Alain Marion. Primary G.I.: Dr Earlean Shawl  [name] is an 83 y.o. female.  HPI: she has a long history of IBS and has diverticulosis. She normally has diarrhea. She began to have vague abdominal pain had been somewhat constipated and began to have rectal bleeding. Her primary gastroenterologist told her to come to the emergency room for evaluation. CT scan showed active diverticulitis and she was passing bright red blood. Her hemoglobin is 11.8. She does get regular colonoscopies from Dr. Earlean Shawl and Norva Riffle. last one was several years ago and showed diverticular disease. The patient's hemoglobin has been stable since coming into the hospital and she is currently receiving IV Cipro and Flagyl.  Past Medical History:  Diagnosis Date  . Anxiety   . Bradycardia   . Breast cancer, left breast ([place]) 1995  . Breast cancer, right breast ([place]) 2002  . CHF (congestive heart failure) ([place])   . Depression    Dr Cheryln Manly  . Discoid lupus    skin  . Full dentures   . GERD (gastroesophageal reflux disease)   . Glaucoma   . Gout   . Hx of echocardiogram    a.  Echocardiogram (03/2011): Mild LVH, EF 32-00%, grade 1 diastolic dysfunction, mild MR, mild to moderate TR, PASP 42;  b. Echo (05/2013):  Mod LVH, EF 65-70%, no WMA, Gr 1 DD, mild MR, mod TR, PASP 39  . Hx of exercise stress test    a. ETT-echocardiogram (06/08/11): Sub-optimal exercise. Test stopped early due to dizziness and hypotension. EF 60%.  Non-diagnostic.;   b. Lexiscan Myoview (06/2013):  Diaphragmatic attenuation, no ischemia, EF 61%.  Low Risk  . Hypertension   . Hypothyroidism   . IBS (irritable bowel syndrome)   . IBS (irritable bowel syndrome)   . Incontinence   . OSA on CPAP    "quit wearing mask; retested; not enough information; to be tested again 04/08/2016"  . Osteoarthritis   .  Osteopenia   . Pacemaker 2009   Brady//Chronotropic incompetence with normal pacemaker function  . Thyroid disease   . Wears hearing aid    both ears    Past Surgical History:  Procedure Laterality Date  . BREAST BIOPSY Left 1995  . BREAST LUMPECTOMY Left 1995   axillary node dissectoin  . CARDIAC CATHETERIZATION N/A 02/13/2016   Procedure: Right Heart Cath;  Surgeon: Larey Dresser, MD;  Location: [facility];  Service: Cardiovascular;  Laterality: N/A;  . CATARACT EXTRACTION W/ INTRAOCULAR LENS  IMPLANT, BILATERAL Bilateral 06/2009 - 12.2915   left - right  . DORSAL COMPARTMENT RELEASE  2000   left  . HAMMER TOE SURGERY Left 2004  . HEMORRHOID BANDING    . INSERT / REPLACE / REMOVE PACEMAKER  2009  . KNEE ARTHROSCOPY Left 08/29/2012   Procedure: LEFT KNEE ARTHROSCOPY ;  Surgeon: Hessie Dibble, MD;  Location: [facility];  Service: Orthopedics;  Laterality: Left;  [name] MASTECTOMY Bilateral 09/2000   nbx  . TRIGGER FINGER RELEASE Right 07/31/2013   Procedure: EXCISE MASS RIGHT INDEX A-1 [place] RELEASE A-1 RIGHT INDEX;  Surgeon: Cammie Sickle., MD;  Location: [facility];  Service: Orthopedics;  Laterality: Right;  [name] VAGINAL HYSTERECTOMY      Family History  Problem Relation Age of Onset  . Ovarian cancer Mother   . Heart disease Father   . Stroke  Sister   . Stroke Brother   . Cancer Brother   . Heart disease Brother     Social History:  reports that she has never smoked. She has never used smokeless tobacco. She reports that she does not drink alcohol or use drugs.  Allergies:  Allergies  Allergen Reactions  . Lactase Diarrhea  . Amlodipine Besy-Benazepril Hcl Swelling  . Amlodipine Besylate Swelling  . Clonidine Hydrochloride     REACTION: tired  . Lactose Intolerance (Gi) Other (See Comments)  . Valsartan Other (See Comments)    Tongue swelling   . Verapamil     Medications; Prior to Admission medications   Medication  Sig Start Date End Date Taking? Authorizing Provider  albuterol (PROVENTIL HFA;VENTOLIN HFA) 108 (90 Base) MCG/ACT inhaler Inhale 2 puffs into the lungs every 6 (six) hours as needed for wheezing or shortness of breath. 09/04/15  Yes Brand Males, MD  allopurinol (ZYLOPRIM) 300 MG tablet TAKE 1 TABLET BY MOUTH DAILY 03/01/16  Yes Deboraha Sprang, MD  cetirizine (ZYRTEC) 10 MG tablet Take 10 mg by mouth daily as needed for allergies.   Yes Historical Provider, MD  escitalopram (LEXAPRO) 10 MG tablet TAKE 1 TABLET (10 MG) BY MOUTH DAILY AT BEDTIME. 05/06/16  Yes Kathlee Nations, MD  feeding supplement, ENSURE ENLIVE, (ENSURE ENLIVE) LIQD Take 237 mLs by mouth 2 (two) times daily between meals. 03/23/16  Yes Reyne Dumas, MD  fluocinonide cream (LIDEX) 6.19 % Apply 1 application topically daily as needed (For spots on face/itching.).  09/11/15  Yes Historical Provider, MD  fluticasone furoate-vilanterol (BREO ELLIPTA) 200-25 MCG/INH AEPB Inhale 1 puff into the lungs daily. 09/04/15  Yes Brand Males, MD  KLOR-CON M20 20 MEQ tablet TAKE 1 TABLET (20 MEQ TOTAL) BY MOUTH DAILY. 03/15/16  Yes Larey Dresser, MD  levothyroxine (SYNTHROID, LEVOTHROID) 100 MCG tablet Take 1 tablet (100 mcg total) by mouth daily. Patient taking differently: Take 100 mcg by mouth daily before breakfast.  08/29/15  Yes Aleksei Plotnikov V, MD  LORazepam (ATIVAN) 0.5 MG tablet Take 1 tab daily and 2nd as needed Patient taking differently: Take 0.5 mg by mouth every 6 (six) hours as needed for anxiety. Take 1 tab daily and 2nd as needed 05/06/16  Yes Kathlee Nations, MD  oxybutynin (DITROPAN) 5 MG tablet Take 1 tablet (5 mg total) by mouth daily as needed. 06/14/16  Yes Aleksei Plotnikov V, MD  pindolol (VISKEN) 5 MG tablet TAKE 1 TABLET BY MOUTH DAILY 12/08/15  Yes Aleksei Plotnikov V, MD  psyllium (METAMUCIL SMOOTH TEXTURE) 28 % packet Take 1 packet by mouth daily with breakfast.   Yes Historical Provider, MD  ranitidine (ZANTAC) 150  MG tablet Take 150 mg by mouth 3 (three) times daily. 01/01/16  Yes Historical Provider, MD  spironolactone (ALDACTONE) 25 MG tablet Take 1 tablet (25 mg total) by mouth daily. 10/27/15  Yes Aleksei Plotnikov V, MD  budesonide-formoterol (SYMBICORT) 80-4.5 MCG/ACT inhaler Inhale 2 puffs into the lungs 2 (two) times daily. Patient not taking: Reported on 06/30/2016 04/07/16   Brand Males, MD  clotrimazole (MYCELEX) 10 MG troche Take 1 tablet (10 mg total) by mouth 5 (five) times daily. Patient not taking: Reported on 06/30/2016 04/21/16   Tammy S Parrett, NP  diltiazem 2 % GEL Apply 1 application topically 2 (two) times daily. Patient not taking: Reported on 06/30/2016 04/30/16   Barnet Glasgow, NP   . allopurinol  300 mg Oral Daily  . ciprofloxacin  400  mg Intravenous Q12H  . escitalopram  10 mg Oral QHS  . famotidine  20 mg Oral BID  . fluticasone furoate-vilanterol  1 puff Inhalation Daily  . levothyroxine  100 mcg Oral QAC breakfast  . metronidazole  500 mg Intravenous Q8H  . oxybutynin  5 mg Oral QHS  . pindolol  5 mg Oral Daily  . potassium chloride SA  20 mEq Oral Daily   PRN Meds acetaminophen **OR** acetaminophen, albuterol, LORazepam, ondansetron (ZOFRAN) IV Results for orders placed or performed during the hospital encounter of 06/30/16 (from the past 48 hour(s))  Comprehensive metabolic panel     Status: Abnormal   Collection Time: 06/30/16  3:35 PM  Result Value Ref Range   Sodium 141 135 - 145 mmol/L   Potassium 4.5 3.5 - 5.1 mmol/L   Chloride 107 101 - 111 mmol/L   CO2 26 22 - 32 mmol/L   Glucose, Bld 113 (H) 65 - 99 mg/dL   BUN 27 (H) 6 - 20 mg/dL   Creatinine, Ser 0.96 0.44 - 1.00 mg/dL   Calcium 9.3 8.9 - 10.3 mg/dL   Total Protein 6.9 6.5 - 8.1 g/dL   Albumin 3.6 3.5 - 5.0 g/dL   AST 22 15 - 41 U/L   ALT 13 (L) 14 - 54 U/L   Alkaline Phosphatase 63 38 - 126 U/L   Total Bilirubin 1.0 0.3 - 1.2 mg/dL   GFR calc non Af Amer 53 (L) >60 mL/min   GFR calc Af Amer >60  >60 mL/min    Comment: (NOTE) The eGFR has been calculated using the CKD EPI equation. This calculation has not been validated in all clinical situations. eGFR's persistently <60 mL/min signify possible Chronic Kidney Disease.    Anion gap 8 5 - 15  CBC     Status: Abnormal   Collection Time: 06/30/16  3:35 PM  Result Value Ref Range   WBC 4.9 4.0 - 10.5 K/uL   RBC 3.90 3.87 - 5.11 MIL/uL   Hemoglobin 12.9 12.0 - 15.0 g/dL   HCT 39.2 36.0 - 46.0 %   MCV 100.5 (H) 78.0 - 100.0 fL   MCH 33.1 26.0 - 34.0 pg   MCHC 32.9 30.0 - 36.0 g/dL   RDW 14.0 11.5 - 15.5 %   Platelets 202 150 - 400 K/uL  Protime-INR     Status: None   Collection Time: 06/30/16  3:35 PM  Result Value Ref Range   Prothrombin Time 14.0 11.4 - 15.2 seconds   INR 1.08   POC occult blood, ED     Status: Abnormal   Collection Time: 06/30/16  8:37 PM  Result Value Ref Range   Fecal Occult Bld POSITIVE (A) NEGATIVE  Blood culture (routine x 2)     Status: None (Preliminary result)   Collection Time: 06/30/16  9:25 PM  Result Value Ref Range   Specimen Description BLOOD RIGHT FOREARM    Special Requests BOTTLES DRAWN AEROBIC AND ANAEROBIC 5CC    Culture NO GROWTH < 24 HOURS    Report Status PENDING   Blood culture (routine x 2)     Status: None (Preliminary result)   Collection Time: 06/30/16  9:30 PM  Result Value Ref Range   Specimen Description BLOOD RIGHT HAND    Special Requests BOTTLES DRAWN AEROBIC AND ANAEROBIC 5CC    Culture NO GROWTH < 24 HOURS    Report Status PENDING   CBC     Status: Abnormal  Collection Time: 07/01/16  1:03 AM  Result Value Ref Range   WBC 5.5 4.0 - 10.5 K/uL   RBC 3.59 (L) 3.87 - 5.11 MIL/uL   Hemoglobin 11.8 (L) 12.0 - 15.0 g/dL   HCT 36.3 36.0 - 46.0 %   MCV 101.1 (H) 78.0 - 100.0 fL   MCH 32.9 26.0 - 34.0 pg   MCHC 32.5 30.0 - 36.0 g/dL   RDW 14.2 11.5 - 15.5 %   Platelets 167 150 - 400 K/uL  CBC     Status: Abnormal   Collection Time: 07/01/16  7:12 AM  Result Value  Ref Range   WBC 4.4 4.0 - 10.5 K/uL   RBC 3.63 (L) 3.87 - 5.11 MIL/uL   Hemoglobin 11.8 (L) 12.0 - 15.0 g/dL   HCT 36.6 36.0 - 46.0 %   MCV 100.8 (H) 78.0 - 100.0 fL   MCH 32.5 26.0 - 34.0 pg   MCHC 32.2 30.0 - 36.0 g/dL   RDW 14.0 11.5 - 15.5 %   Platelets 166 150 - 400 K/uL  Basic metabolic panel     Status: Abnormal   Collection Time: 07/01/16  7:12 AM  Result Value Ref Range   Sodium 137 135 - 145 mmol/L   Potassium 3.8 3.5 - 5.1 mmol/L   Chloride 109 101 - 111 mmol/L   CO2 21 (L) 22 - 32 mmol/L   Glucose, Bld 83 65 - 99 mg/dL   BUN 22 (H) 6 - 20 mg/dL   Creatinine, Ser 0.83 0.44 - 1.00 mg/dL   Calcium 8.4 (L) 8.9 - 10.3 mg/dL   GFR calc non Af Amer >60 >60 mL/min   GFR calc Af Amer >60 >60 mL/min    Comment: (NOTE) The eGFR has been calculated using the CKD EPI equation. This calculation has not been validated in all clinical situations. eGFR's persistently <60 mL/min signify possible Chronic Kidney Disease.    Anion gap 7 5 - 15  Type and screen [place]     Status: None   Collection Time: 07/01/16  1:05 PM  Result Value Ref Range   ABO/RH(D) A POS    Antibody Screen NEG    Sample Expiration 07/04/2016   ABO/Rh     Status: None   Collection Time: 07/01/16  1:05 PM  Result Value Ref Range   ABO/RH(D) A POS   Hemoglobin and hematocrit, blood     Status: None   Collection Time: 07/01/16  1:08 PM  Result Value Ref Range   Hemoglobin 12.3 12.0 - 15.0 g/dL   HCT 38.0 36.0 - 46.0 %    Ct Abdomen Pelvis W Contrast  Result Date: 06/30/2016 CLINICAL DATA:  Rectal bleeding. Left lower quadrant abdominal pain. Symptoms became worse today. Personal history of anal fissures and diverticulitis. IBS. Personal history of breast cancer. EXAM: CT ABDOMEN AND PELVIS WITH CONTRAST TECHNIQUE: Multidetector CT imaging of the abdomen and pelvis was performed using the standard protocol following bolus administration of intravenous contrast. CONTRAST:  180m  ISOVUE-300 IOPAMIDOL (ISOVUE-300) INJECTION 61% COMPARISON:  CT of the chest, abdomen, and pelvis 02/11/2009. FINDINGS: Lower chest: Lung bases are clear without focal nodule, mass, or airspace disease. The heart is mildly enlarged. There is no edema or effusion. Pacing wires are in place. Hepatobiliary: No focal intrahepatic biliary dilation is present. Multiple gallstones are noted. There is no inflammatory change about the gallbladder. Pancreas: No significant inflammatory changes are present. There is mild dilation of the pancreatic duct.  No obstructing mass lesion is evident. Spleen: Normal in size without focal abnormality. Adrenals/Urinary Tract: Adrenal glands are normal bilaterally. A 2.5 cm cyst is present at the lower pole of the left kidney. The focally tear present. There are no stones. The urinary bladder is within normal limits. Stomach/Bowel: A small hiatal hernia is present. The stomach and duodenum are otherwise unremarkable. Small bowel is within normal limits. Appendix is visualized and normal. The ascending and transverse colon are within normal limits. Diverticular changes are present in the descending and sigmoid colon. There is focal wall thickening inflammatory change in the distal sigmoid colon. The rectum is unremarkable. No anal fissures are evident. Vascular/Lymphatic: Atherosclerotic calcifications are present in the aorta and branch vessels without aneurysm. No significant retroperitoneal or pelvic adenopathy is present. Reproductive: Hysterectomy is noted. The ovaries are not discretely visualized and may be surgically absent as well. Other: No significant free fluid or free air is present. Musculoskeletal: Degenerative endplate changes and facet arthropathy are present in the lower lumbar spine. No focal lytic or blastic lesions are present. Vertebral body heights are maintained. Bony pelvis is intact. The hips are located. Degenerative changes are noted at the SI joints bilaterally.  IMPRESSION: 1. Inflammatory changes of the sigmoid colon with diverticula compatible with acute diverticulitis. There is no abscess or free air. No complicating features are evident. 2. Additional diverticular present within the remaining sigmoid colon and descending colon without other inflammatory change. 3. Small hiatal hernia. 4. Mild atherosclerotic changes within the aorta and branch vessels. 5. Mild cardiomegaly without failure. Electronically Signed   By: San Morelle M.D.   On: 06/30/2016 20:22   Dg Abd Portable 1v  Result Date: 07/01/2016 CLINICAL DATA:  Nausea and vomiting EXAM: PORTABLE ABDOMEN - 1 VIEW COMPARISON:  06/30/2016 FINDINGS: Scattered large and small bowel gas is seen. No obstructive changes are seen. No free air is noted. Mild degenerative change of the lumbar spine is noted. IMPRESSION: No acute abnormality seen. Electronically Signed   By: Inez Catalina M.D.   On: 07/01/2016 15:49               Blood pressure 114/74, pulse 62, temperature 98.7 F (37.1 C), temperature source Oral, resp. rate 20, height 5' 4"  (1.626 m), weight 73.6 kg (162 lb 4.8 oz), SpO2 98 %.  Physical exam:   General-- talkative African-American female no acute distress ENT-- nonicteric Neck-- supple with full range of motion Heart-- normal S1 and S2 without murmurs are gallops Lungs-- clear Abdomen-- soft and nontender except in left lower quadrant rectal - not performed. Patient just had a bowel movement which I observed and was solid stool with a moderate amount of bright red blood. Psych-- alert and oriented answers questions appropriately   Assessment: 1. Acute diverticulitis with bleeding. Her hemoglobin is stable and she is on appropriate medications. 2. History of bradycardia has pacemaker 3. History of IBS followed by Dr. Earlean Shawl 3 history of congestive heart failure  Plan: 1. Would continue IV Cipro on Flagyl for now and is doing well tomorrow would switch over to  oral medications 2. Continue clear liquids for now will go ahead and add Miralax. If her stool clears up and she quits bleeding we could answer to full liquid diet tomorrow.   Tiffannie Sloss JR,Marykay Mccleod L 07/01/2016, 5:09 PM   This note was created using voice recognition software and minor errors may Have occurred unintentionally. Pager: [phone] If no answer or after hours call [phone]

## 2016-07-01 NOTE — Progress Notes (Addendum)
PROGRESS NOTE                                                                                                                                                                                                             Patient Demographics:    [name], is a 83 y.o. female, DOB - 05/11/1929, AST:[id number]  Admit date - 06/30/2016   Admitting Physician Rise Patience, MD  Outpatient Primary MD for the patient is Walker Kehr, MD  LOS - 0  Chief Complaint  Patient presents with  . Rectal Bleeding       Brief Narrative   [name] is a 83 y.o. female with history of before meals node dysfunction status post pacemaker placement, diastolic CHF, sleep apnea presented to the ER patient had rectal bleeding yesterday morning. Patient states she had a bowel movement and when trying to clean she found frank rectal bleeding in the commode with clots. Denies any nausea vomiting abdominal pain or diarrhea.    Subjective:    [name] today has, No headache, No chest pain, No abdominal pain - No Nausea, No new weakness tingling or numbness, No Cough - SOB.     Assessment  & Plan :     1. Rectal bleeding - likely diverticular/Internal hemorrhoid in origin. Patient has not had any further bleeds since in the ER. Will hold aspirin.         Follow serial H&H, have informed GI M.D.         Dr. Oletta Lamas evaluated the patient as well, type screen done. 2. Sigmoid diverticulitis per CAT scan - on exam patient's abdomen is not tender. Will keep patient on Cipro and Flagyl along with liquid diet. 3. Hypertension - on pindolol. Closely follow blood pressure trends. 4. Chronic Diastolic CHF EF 98%- appears compensated. Hold spironolactone for now due to GI bleed. 5. Sleep apnea on CPAP daily at bedtime.   Diet : Diet clear liquid Room service appropriate? Yes; Fluid consistency: Thin    Family Communication  :  None  Code Status  :  Full  Disposition Plan  :  TBD  Consults  :  GI  Procedures  :    DVT Prophylaxis  :   SCDs    Lab Results  Component Value Date   PLT 166 07/01/2016  Inpatient Medications  Scheduled Meds: . allopurinol  300 mg Oral Daily  . ciprofloxacin  400 mg Intravenous Q12H  . escitalopram  10 mg Oral QHS  . famotidine  20 mg Oral BID  . fluticasone furoate-vilanterol  1 puff Inhalation Daily  . levothyroxine  100 mcg Oral QAC breakfast  . metronidazole  500 mg Intravenous Q8H  . oxybutynin  5 mg Oral QHS  . pindolol  5 mg Oral Daily  . potassium chloride SA  20 mEq Oral Daily   Continuous Infusions: PRN Meds:.acetaminophen **OR** acetaminophen, albuterol, LORazepam  Antibiotics  :    Anti-infectives    Start     Dose/Rate Route Frequency Ordered Stop   07/01/16 0600  metroNIDAZOLE (FLAGYL) IVPB 500 mg     500 mg 100 mL/hr over 60 Minutes Intravenous Every 8 hours 07/01/16 0050     07/01/16 0130  ciprofloxacin (CIPRO) IVPB 400 mg     400 mg 200 mL/hr over 60 Minutes Intravenous Every 12 hours 07/01/16 0116     06/30/16 2045  ciprofloxacin (CIPRO) IVPB 400 mg     400 mg 200 mL/hr over 60 Minutes Intravenous  Once 06/30/16 2033 06/30/16 2259   06/30/16 2045  metroNIDAZOLE (FLAGYL) IVPB 500 mg     500 mg 100 mL/hr over 60 Minutes Intravenous  Once 06/30/16 2033 06/30/16 2259         Objective:   Vitals:   06/30/16 2200 06/30/16 2246 07/01/16 0530 07/01/16 1003  BP: 140/70 (!) 161/76 122/67 118/70  Pulse: 65 60 65 60  Resp: 20 18 18    Temp:  98.3 F (36.8 C) 98.7 F (37.1 C)   TempSrc:  Oral Oral   SpO2: 97% 98% 98%   Weight:  74.4 kg (164 lb) 73.6 kg (162 lb 4.8 oz)   Height:  5\' 4"  (1.626 m)      Wt Readings from Last 3 Encounters:  07/01/16 73.6 kg (162 lb 4.8 oz)  04/08/16 73.9 kg (163 lb)  04/02/16 73.5 kg (162 lb)     Intake/Output Summary (Last 24 hours) at 07/01/16 1232 Last data filed at 07/01/16 1008  Gross per 24 hour  Intake              1840 ml  Output              650 ml  Net             1190 ml     Physical Exam  Awake Alert, Oriented X 3, No new F.N deficits, Normal affect [place].AT,PERRAL Supple Neck,No JVD, No cervical lymphadenopathy appriciated.  Symmetrical Chest wall movement, Good air movement bilaterally, CTAB RRR,No Gallops,Rubs or new Murmurs, No Parasternal Heave +ve B.Sounds, Abd Soft, No tenderness, No organomegaly appriciated, No rebound - guarding or rigidity. No Cyanosis, Clubbing or edema, No new Rash or bruise      Data Review:    CBC  Recent Labs Lab 06/30/16 1535 07/01/16 0103 07/01/16 0712  WBC 4.9 5.5 4.4  HGB 12.9 11.8* 11.8*  HCT 39.2 36.3 36.6  PLT 202 167 166  MCV 100.5* 101.1* 100.8*  MCH 33.1 32.9 32.5  MCHC 32.9 32.5 32.2  RDW 14.0 14.2 14.0    Chemistries   Recent Labs Lab 06/30/16 1535 07/01/16 0712  NA 141 137  K 4.5 3.8  CL 107 109  CO2 26 21*  GLUCOSE 113* 83  BUN 27* 22*  CREATININE 0.96 0.83  CALCIUM 9.3 8.4*  AST  22  --   ALT 13*  --   ALKPHOS 63  --   BILITOT 1.0  --    ------------------------------------------------------------------------------------------------------------------ No results for input(s): CHOL, HDL, LDLCALC, TRIG, CHOLHDL, LDLDIRECT in the last 72 hours.  Lab Results  Component Value Date   HGBA1C 6.0 04/10/2012   ------------------------------------------------------------------------------------------------------------------ No results for input(s): TSH, T4TOTAL, T3FREE, THYROIDAB in the last 72 hours.  Invalid input(s): FREET3 ------------------------------------------------------------------------------------------------------------------ No results for input(s): VITAMINB12, FOLATE, FERRITIN, TIBC, IRON, RETICCTPCT in the last 72 hours.  Coagulation profile  Recent Labs Lab 06/30/16 1535  INR 1.08    No results for input(s): DDIMER in the last 72 hours.  Cardiac Enzymes No results for input(s): CKMB,  TROPONINI, MYOGLOBIN in the last 168 hours.  Invalid input(s): CK ------------------------------------------------------------------------------------------------------------------    Component Value Date/Time   BNP 81.9 03/22/2016 1230    Micro Results  No results found for this or any previous visit (from the past 240 hour(s)).  Radiology Reports  Ct Abdomen Pelvis W Contrast  Result Date: 06/30/2016 CLINICAL DATA:  Rectal bleeding. Left lower quadrant abdominal pain. Symptoms became worse today. Personal history of anal fissures and diverticulitis. IBS. Personal history of breast cancer. EXAM: CT ABDOMEN AND PELVIS WITH CONTRAST TECHNIQUE: Multidetector CT imaging of the abdomen and pelvis was performed using the standard protocol following bolus administration of intravenous contrast. CONTRAST:  154mL ISOVUE-300 IOPAMIDOL (ISOVUE-300) INJECTION 61% COMPARISON:  CT of the chest, abdomen, and pelvis 02/11/2009. FINDINGS: Lower chest: Lung bases are clear without focal nodule, mass, or airspace disease. The heart is mildly enlarged. There is no edema or effusion. Pacing wires are in place. Hepatobiliary: No focal intrahepatic biliary dilation is present. Multiple gallstones are noted. There is no inflammatory change about the gallbladder. Pancreas: No significant inflammatory changes are present. There is mild dilation of the pancreatic duct. No obstructing mass lesion is evident. Spleen: Normal in size without focal abnormality. Adrenals/Urinary Tract: Adrenal glands are normal bilaterally. A 2.5 cm cyst is present at the lower pole of the left kidney. The focally tear present. There are no stones. The urinary bladder is within normal limits. Stomach/Bowel: A small hiatal hernia is present. The stomach and duodenum are otherwise unremarkable. Small bowel is within normal limits. Appendix is visualized and normal. The ascending and transverse colon are within normal limits. Diverticular changes are  present in the descending and sigmoid colon. There is focal wall thickening inflammatory change in the distal sigmoid colon. The rectum is unremarkable. No anal fissures are evident. Vascular/Lymphatic: Atherosclerotic calcifications are present in the aorta and branch vessels without aneurysm. No significant retroperitoneal or pelvic adenopathy is present. Reproductive: Hysterectomy is noted. The ovaries are not discretely visualized and may be surgically absent as well. Other: No significant free fluid or free air is present. Musculoskeletal: Degenerative endplate changes and facet arthropathy are present in the lower lumbar spine. No focal lytic or blastic lesions are present. Vertebral body heights are maintained. Bony pelvis is intact. The hips are located. Degenerative changes are noted at the SI joints bilaterally. IMPRESSION: 1. Inflammatory changes of the sigmoid colon with diverticula compatible with acute diverticulitis. There is no abscess or free air. No complicating features are evident. 2. Additional diverticular present within the remaining sigmoid colon and descending colon without other inflammatory change. 3. Small hiatal hernia. 4. Mild atherosclerotic changes within the aorta and branch vessels. 5. Mild cardiomegaly without failure. Electronically Signed   By: San Morelle M.D.   On: 06/30/2016 20:22  Time Spent in minutes  30   Chistine Dematteo K M.D on 07/01/2016 at 12:32 PM  Between 7am to 7pm - Pager - [phone]  After 7pm go to [email or web address] - password [facility]  [facility] -  Office  [phone]

## 2016-07-01 NOTE — H&P (Signed)
History and Physical    [name] SWN:[id number] DOB: Oct 17, 1929 DOA: 06/30/2016  PCP: Walker Kehr, MD  Patient coming from: Home.  Chief Complaint: Rectal bleeding.  HPI: [name] is a 83 y.o. female with history of before meals node dysfunction status post pacemaker placement, diastolic CHF, sleep apnea presented to the ER patient had rectal bleeding yesterday morning. Patient states she had a bowel movement and when trying to clean she found frank rectal bleeding in the commode with clots. Denies any nausea vomiting abdominal pain or diarrhea.   ED Course: Hemoglobin the ER was found to be around 12. Patient was hemodynamically stable. CT of the abdomen shows left-sided sigmoid diverticulitis. Patient takes aspirin otherwise denies taking any NSAIDs. Patient is being admitted for further observation.  Review of Systems: As per HPI, rest all negative.   Past Medical History:  Diagnosis Date  . Anxiety   . Bradycardia   . Breast cancer, left breast ([place]) 1995  . Breast cancer, right breast ([place]) 2002  . CHF (congestive heart failure) ([place])   . Depression    Dr Cheryln Manly  . Discoid lupus    skin  . Full dentures   . GERD (gastroesophageal reflux disease)   . Glaucoma   . Gout   . Hx of echocardiogram    a.  Echocardiogram (03/2011): Mild LVH, EF 93-81%, grade 1 diastolic dysfunction, mild MR, mild to moderate TR, PASP 42;  b. Echo (05/2013):  Mod LVH, EF 65-70%, no WMA, Gr 1 DD, mild MR, mod TR, PASP 39  . Hx of exercise stress test    a. ETT-echocardiogram (06/08/11): Sub-optimal exercise. Test stopped early due to dizziness and hypotension. EF 60%.  Non-diagnostic.;   b. Lexiscan Myoview (06/2013):  Diaphragmatic attenuation, no ischemia, EF 61%.  Low Risk  . Hypertension   . Hypothyroidism   . IBS (irritable bowel syndrome)   . IBS (irritable bowel syndrome)   . Incontinence   . OSA on CPAP    "quit wearing mask; retested; not enough information; to be tested again  04/08/2016"  . Osteoarthritis   . Osteopenia   . Pacemaker 2009   Brady//Chronotropic incompetence with normal pacemaker function  . Thyroid disease   . Wears hearing aid    both ears    Past Surgical History:  Procedure Laterality Date  . BREAST BIOPSY Left 1995  . BREAST LUMPECTOMY Left 1995   axillary node dissectoin  . CARDIAC CATHETERIZATION N/A 02/13/2016   Procedure: Right Heart Cath;  Surgeon: Larey Dresser, MD;  Location: [facility];  Service: Cardiovascular;  Laterality: N/A;  . CATARACT EXTRACTION W/ INTRAOCULAR LENS  IMPLANT, BILATERAL Bilateral 06/2009 - 12.2915   left - right  . DORSAL COMPARTMENT RELEASE  2000   left  . HAMMER TOE SURGERY Left 2004  . HEMORRHOID BANDING    . INSERT / REPLACE / REMOVE PACEMAKER  2009  . KNEE ARTHROSCOPY Left 08/29/2012   Procedure: LEFT KNEE ARTHROSCOPY ;  Surgeon: Hessie Dibble, MD;  Location: [facility];  Service: Orthopedics;  Laterality: Left;  [name] MASTECTOMY Bilateral 09/2000   nbx  . TRIGGER FINGER RELEASE Right 07/31/2013   Procedure: EXCISE MASS RIGHT INDEX A-1 PLLLEY RELEASE A-1 RIGHT INDEX;  Surgeon: Cammie Sickle., MD;  Location: [facility];  Service: Orthopedics;  Laterality: Right;  [name] VAGINAL HYSTERECTOMY       reports that she has never smoked. She has never used smokeless  tobacco. She reports that she does not drink alcohol or use drugs.  Allergies  Allergen Reactions  . Lactase Diarrhea  . Amlodipine Besy-Benazepril Hcl Swelling  . Amlodipine Besylate Swelling  . Clonidine Hydrochloride     REACTION: tired  . Lactose Intolerance (Gi) Other (See Comments)  . Valsartan Other (See Comments)    Tongue swelling   . Verapamil     Family History  Problem Relation Age of Onset  . Ovarian cancer Mother   . Heart disease Father   . Stroke Sister   . Stroke Brother   . Cancer Brother   . Heart disease Brother     Prior to Admission medications   Medication Sig Start  Date End Date Taking? Authorizing Provider  albuterol (PROVENTIL HFA;VENTOLIN HFA) 108 (90 Base) MCG/ACT inhaler Inhale 2 puffs into the lungs every 6 (six) hours as needed for wheezing or shortness of breath. 09/04/15  Yes Brand Males, MD  allopurinol (ZYLOPRIM) 300 MG tablet TAKE 1 TABLET BY MOUTH DAILY 03/01/16  Yes Deboraha Sprang, MD  cetirizine (ZYRTEC) 10 MG tablet Take 10 mg by mouth daily as needed for allergies.   Yes Historical Provider, MD  escitalopram (LEXAPRO) 10 MG tablet TAKE 1 TABLET (10 MG) BY MOUTH DAILY AT BEDTIME. 05/06/16  Yes Kathlee Nations, MD  feeding supplement, ENSURE ENLIVE, (ENSURE ENLIVE) LIQD Take 237 mLs by mouth 2 (two) times daily between meals. 03/23/16  Yes Reyne Dumas, MD  fluocinonide cream (LIDEX) 1.27 % Apply 1 application topically daily as needed (For spots on face/itching.).  09/11/15  Yes Historical Provider, MD  fluticasone furoate-vilanterol (BREO ELLIPTA) 200-25 MCG/INH AEPB Inhale 1 puff into the lungs daily. 09/04/15  Yes Brand Males, MD  KLOR-CON M20 20 MEQ tablet TAKE 1 TABLET (20 MEQ TOTAL) BY MOUTH DAILY. 03/15/16  Yes Larey Dresser, MD  levothyroxine (SYNTHROID, LEVOTHROID) 100 MCG tablet Take 1 tablet (100 mcg total) by mouth daily. Patient taking differently: Take 100 mcg by mouth daily before breakfast.  08/29/15  Yes Aleksei Plotnikov V, MD  LORazepam (ATIVAN) 0.5 MG tablet Take 1 tab daily and 2nd as needed Patient taking differently: Take 0.5 mg by mouth every 6 (six) hours as needed for anxiety. Take 1 tab daily and 2nd as needed 05/06/16  Yes Kathlee Nations, MD  oxybutynin (DITROPAN) 5 MG tablet Take 1 tablet (5 mg total) by mouth daily as needed. 06/14/16  Yes Aleksei Plotnikov V, MD  pindolol (VISKEN) 5 MG tablet TAKE 1 TABLET BY MOUTH DAILY 12/08/15  Yes Aleksei Plotnikov V, MD  psyllium (METAMUCIL SMOOTH TEXTURE) 28 % packet Take 1 packet by mouth daily with breakfast.   Yes Historical Provider, MD  ranitidine (ZANTAC) 150 MG tablet  Take 150 mg by mouth 3 (three) times daily. 01/01/16  Yes Historical Provider, MD  spironolactone (ALDACTONE) 25 MG tablet Take 1 tablet (25 mg total) by mouth daily. 10/27/15  Yes Aleksei Plotnikov V, MD  budesonide-formoterol (SYMBICORT) 80-4.5 MCG/ACT inhaler Inhale 2 puffs into the lungs 2 (two) times daily. Patient not taking: Reported on 06/30/2016 04/07/16   Brand Males, MD  clotrimazole (MYCELEX) 10 MG troche Take 1 tablet (10 mg total) by mouth 5 (five) times daily. Patient not taking: Reported on 06/30/2016 04/21/16   Tammy S Parrett, NP  diltiazem 2 % GEL Apply 1 application topically 2 (two) times daily. Patient not taking: Reported on 06/30/2016 04/30/16   Barnet Glasgow, NP    Physical Exam: Vitals:  06/30/16 2030 06/30/16 2113 06/30/16 2200 06/30/16 2246  BP: 152/83 145/77 140/70 (!) 161/76  Pulse: 69 113 65 60  Resp:   20 18  Temp:    98.3 F (36.8 C)  TempSrc:    Oral  SpO2: 100% 99% 97% 98%  Weight:    74.4 kg (164 lb)  Height:    5\' 4"  (1.626 m)      Constitutional: Moderately built and nourished. Vitals:   06/30/16 2030 06/30/16 2113 06/30/16 2200 06/30/16 2246  BP: 152/83 145/77 140/70 (!) 161/76  Pulse: 69 113 65 60  Resp:   20 18  Temp:    98.3 F (36.8 C)  TempSrc:    Oral  SpO2: 100% 99% 97% 98%  Weight:    74.4 kg (164 lb)  Height:    5\' 4"  (1.626 m)   Eyes: Anicteric no pallor. ENMT: No discharge from the ears eyes nose and mouth. Neck: No mass felt. No JVD appreciated. Respiratory: No rhonchi or crepitations. Cardiovascular: S1-S2. No murmurs appreciated. Abdomen: Soft nontender bowel sounds present. Musculoskeletal: No edema. No joint effusion. Skin: No rash. Skin appears warm. Neurologic: Alert awake oriented to time place and person. Moves all extremities. Psychiatric: Appears normal. Normal affect.   Labs on Admission: I have personally reviewed following labs and imaging studies  CBC:  Recent Labs Lab 06/30/16 1535  WBC 4.9  HGB  12.9  HCT 39.2  MCV 100.5*  PLT 423   Basic Metabolic Panel:  Recent Labs Lab 06/30/16 1535  NA 141  K 4.5  CL 107  CO2 26  GLUCOSE 113*  BUN 27*  CREATININE 0.96  CALCIUM 9.3   GFR: Estimated Creatinine Clearance: 43.1 mL/min (by C-G formula based on SCr of 0.96 mg/dL). Liver Function Tests:  Recent Labs Lab 06/30/16 1535  AST 22  ALT 13*  ALKPHOS 63  BILITOT 1.0  PROT 6.9  ALBUMIN 3.6   No results for input(s): LIPASE, AMYLASE in the last 168 hours. No results for input(s): AMMONIA in the last 168 hours. Coagulation Profile:  Recent Labs Lab 06/30/16 1535  INR 1.08   Cardiac Enzymes: No results for input(s): CKTOTAL, CKMB, CKMBINDEX, TROPONINI in the last 168 hours. BNP (last 3 results) No results for input(s): PROBNP in the last 8760 hours. HbA1C: No results for input(s): HGBA1C in the last 72 hours. CBG: No results for input(s): GLUCAP in the last 168 hours. Lipid Profile: No results for input(s): CHOL, HDL, LDLCALC, TRIG, CHOLHDL, LDLDIRECT in the last 72 hours. Thyroid Function Tests: No results for input(s): TSH, T4TOTAL, FREET4, T3FREE, THYROIDAB in the last 72 hours. Anemia Panel: No results for input(s): VITAMINB12, FOLATE, FERRITIN, TIBC, IRON, RETICCTPCT in the last 72 hours. Urine analysis:    Component Value Date/Time   COLORURINE YELLOW 03/22/2016 [place] 03/22/2016 1257   LABSPEC 1.010 03/22/2016 1257   PHURINE 7.5 03/22/2016 1257   GLUCOSEU NEGATIVE 03/22/2016 1257   GLUCOSEU NEGATIVE 10/10/2015 1642   HGBUR NEGATIVE 03/22/2016 1257   BILIRUBINUR NEGATIVE 03/22/2016 1257   KETONESUR NEGATIVE 03/22/2016 1257   PROTEINUR NEGATIVE 03/22/2016 1257   UROBILINOGEN 0.2 10/10/2015 1642   NITRITE NEGATIVE 03/22/2016 1257   LEUKOCYTESUR TRACE (A) 03/22/2016 1257   Sepsis Labs: @LABRCNTIP (procalcitonin:4,lacticidven:4) )No results found for this or any previous visit (from the past 240 hour(s)).   Radiological Exams  on Admission: Ct Abdomen Pelvis W Contrast  Result Date: 06/30/2016 CLINICAL DATA:  Rectal bleeding. Left lower quadrant abdominal pain.  Symptoms became worse today. Personal history of anal fissures and diverticulitis. IBS. Personal history of breast cancer. EXAM: CT ABDOMEN AND PELVIS WITH CONTRAST TECHNIQUE: Multidetector CT imaging of the abdomen and pelvis was performed using the standard protocol following bolus administration of intravenous contrast. CONTRAST:  169mL ISOVUE-300 IOPAMIDOL (ISOVUE-300) INJECTION 61% COMPARISON:  CT of the chest, abdomen, and pelvis 02/11/2009. FINDINGS: Lower chest: Lung bases are clear without focal nodule, mass, or airspace disease. The heart is mildly enlarged. There is no edema or effusion. Pacing wires are in place. Hepatobiliary: No focal intrahepatic biliary dilation is present. Multiple gallstones are noted. There is no inflammatory change about the gallbladder. Pancreas: No significant inflammatory changes are present. There is mild dilation of the pancreatic duct. No obstructing mass lesion is evident. Spleen: Normal in size without focal abnormality. Adrenals/Urinary Tract: Adrenal glands are normal bilaterally. A 2.5 cm cyst is present at the lower pole of the left kidney. The focally tear present. There are no stones. The urinary bladder is within normal limits. Stomach/Bowel: A small hiatal hernia is present. The stomach and duodenum are otherwise unremarkable. Small bowel is within normal limits. Appendix is visualized and normal. The ascending and transverse colon are within normal limits. Diverticular changes are present in the descending and sigmoid colon. There is focal wall thickening inflammatory change in the distal sigmoid colon. The rectum is unremarkable. No anal fissures are evident. Vascular/Lymphatic: Atherosclerotic calcifications are present in the aorta and branch vessels without aneurysm. No significant retroperitoneal or pelvic adenopathy is  present. Reproductive: Hysterectomy is noted. The ovaries are not discretely visualized and may be surgically absent as well. Other: No significant free fluid or free air is present. Musculoskeletal: Degenerative endplate changes and facet arthropathy are present in the lower lumbar spine. No focal lytic or blastic lesions are present. Vertebral body heights are maintained. Bony pelvis is intact. The hips are located. Degenerative changes are noted at the SI joints bilaterally. IMPRESSION: 1. Inflammatory changes of the sigmoid colon with diverticula compatible with acute diverticulitis. There is no abscess or free air. No complicating features are evident. 2. Additional diverticular present within the remaining sigmoid colon and descending colon without other inflammatory change. 3. Small hiatal hernia. 4. Mild atherosclerotic changes within the aorta and branch vessels. 5. Mild cardiomegaly without failure. Electronically Signed   By: San Morelle M.D.   On: 06/30/2016 20:22     Assessment/Plan Active Problems:   Sinoatrial node dysfunction (HCC)   Pulmonary hypertension   Ascending aortic aneurysm (HCC)   Benign essential HTN   CKD (chronic kidney disease) stage 3, GFR 30-59 ml/min   Rectal bleeding   Sigmoid diverticulitis   Acute diverticulitis    1. Rectal bleeding - likely diverticular in origin. Patient has not had any further bleeds in the ER. Will hold aspirin. Follow serial CBC. Patient has had a colonoscopy in 2013 and patient states she is due for one now with Dr. Manya Silvas, gastroenterologist. 2. Sigmoid diverticulitis per CAT scan - on exam patient's abdomen is not tender. Will keep patient on Cipro and Flagyl. 3. Hypertension - on pindolol. Closely follow blood pressure trends. 4. Diastolic CHF - appears compensated. Hold spironolactone for now due to GI bleed. 5. Sleep apnea on CPAP.   DVT prophylaxis: SCDs. Code Status: DO NOT RESUSCITATE.  Family Communication:  Discussed with patient.  Disposition Plan: Home.  Consults called: None.  Admission status: Observation.    Rise Patience MD [facility] Pager [phone].  If  7PM-7AM, please contact night-coverage [email or web address] Password [email or web address]  07/01/2016, 12:51 AM

## 2016-07-02 LAB — HEMOGLOBIN AND HEMATOCRIT, BLOOD
HCT: 34.5 % — ABNORMAL LOW (ref 36.0–46.0)
HEMATOCRIT: 35.8 % — AB (ref 36.0–46.0)
HEMOGLOBIN: 11.7 g/dL — AB (ref 12.0–15.0)
Hemoglobin: 11.5 g/dL — ABNORMAL LOW (ref 12.0–15.0)

## 2016-07-02 LAB — BASIC METABOLIC PANEL
Anion gap: 7 (ref 5–15)
BUN: 17 mg/dL (ref 6–20)
CO2: 22 mmol/L (ref 22–32)
Calcium: 8.6 mg/dL — ABNORMAL LOW (ref 8.9–10.3)
Chloride: 107 mmol/L (ref 101–111)
Creatinine, Ser: 0.93 mg/dL (ref 0.44–1.00)
GFR calc Af Amer: >60 mL/min (ref >60)
GFR, EST NON AFRICAN AMERICAN: 55 mL/min — AB (ref >60)
GLUCOSE: 85 mg/dL (ref 65–99)
POTASSIUM: 3.7 mmol/L (ref 3.5–5.1)
Sodium: 136 mmol/L (ref 135–145)

## 2016-07-02 MED ORDER — METRONIDAZOLE 500 MG PO TABS: 500.0000 mg | ORAL_TABLET | Freq: Three times a day (TID) | ORAL | 0 refills | Status: DC

## 2016-07-02 MED ORDER — CIPROFLOXACIN HCL 500 MG PO TABS: 500.0000 mg | ORAL_TABLET | Freq: Two times a day (BID) | ORAL | 0 refills | Status: DC

## 2016-07-02 MED ORDER — METRONIDAZOLE 500 MG PO TABS: 500.0000 mg | ORAL_TABLET | Freq: Three times a day (TID) | ORAL | Status: DC

## 2016-07-02 MED ORDER — CIPROFLOXACIN HCL 500 MG PO TABS: 500.0000 mg | ORAL_TABLET | Freq: Two times a day (BID) | ORAL | Status: DC

## 2016-07-02 NOTE — Discharge Instructions (Signed)
Follow with Primary MD Walker Kehr, MD in 3-4 days   Get CBC, CMP, 2 view Chest X ray checked  by Primary MD in 3-4 days ( we routinely change or add medications that can affect your baseline labs and fluid status, therefore we recommend that you get the mentioned basic workup next visit with your PCP, your PCP may decide not to get them or add new tests based on their clinical decision)  Activity: As tolerated with Full fall precautions use walker/cane & assistance as needed  Disposition Home    Diet:   DIET SOFT .  For Heart failure patients - Check your Weight same time everyday, if you gain over 2 pounds, or you develop in leg swelling, experience more shortness of breath or chest pain, call your Primary MD immediately. Follow Cardiac Low Salt Diet and 1.5 lit/day fluid restriction.  On your next visit with your primary care physician please Get Medicines reviewed and adjusted.  Please request your Prim.MD to go over all Hospital Tests and Procedure/Radiological results at the follow up, please get all Hospital records sent to your Prim MD by signing hospital release before you go home.  If you experience worsening of your admission symptoms, develop shortness of breath, life threatening emergency, suicidal or homicidal thoughts you must seek medical attention immediately by calling 911 or calling your MD immediately  if symptoms less severe.  You Must read complete instructions/literature along with all the possible adverse reactions/side effects for all the Medicines you take and that have been prescribed to you. Take any new Medicines after you have completely understood and accpet all the possible adverse reactions/side effects.   Do not drive, operate heavy machinery, perform activities at heights, swimming or participation in water activities or provide baby sitting services if your were admitted for syncope or siezures until you have seen by Primary MD or a Neurologist and advised  to do so again.  Do not drive when taking Pain medications.    Do not take more than prescribed Pain, Sleep and Anxiety Medications  Special Instructions: If you have smoked or chewed Tobacco  in the last 2 yrs please stop smoking, stop any regular Alcohol  and or any Recreational drug use.  Wear Seat belts while driving.   Please note  You were cared for by a hospitalist during your hospital stay. If you have any questions about your discharge medications or the care you received while you were in the hospital after you are discharged, you can call the unit and asked to speak with the hospitalist on call if the hospitalist that took care of you is not available. Once you are discharged, your primary care physician will handle any further medical issues. Please note that NO REFILLS for any discharge medications will be authorized once you are discharged, as it is imperative that you return to your primary care physician (or establish a relationship with a primary care physician if you do not have one) for your aftercare needs so that they can reassess your need for medications and monitor your lab values.

## 2016-07-02 NOTE — Progress Notes (Signed)
Report received from E.D., RN. Pt. Alert and stable, resting in bed. Call light within reach. Bed alarm on. Pt. Told to call to assist with BSC. RN will continue to monitor for changes in condition. Helane Briceno, Katherine Roan

## 2016-07-02 NOTE — Discharge Summary (Signed)
[name] MCN:[id number] DOB: [birth year] DOA: 06/30/2016  PCP: [name] Kehr, MD  Admit date: 06/30/2016  Discharge date: 07/02/2016  Admitted From: Home  Disposition:  Home   Recommendations for Outpatient Follow-up:   Follow up with PCP in 1-2 weeks  PCP Please obtain BMP/CBC, 2 view CXR in 1week,  (see Discharge instructions)   PCP Please follow up on the following pending results: None   Home Health: None   Equipment/Devices: None  Consultations: GI Discharge Condition: Stable   CODE STATUS: Full   Diet Recommendation: DIET SOFT    Chief Complaint  Patient presents with  . Rectal Bleeding     Brief history of present illness from the day of admission and additional interim summary    [name] Cainis a 83 y.o.femalewith history of before meals node dysfunction status post pacemaker placement, diastolic CHF, sleep apnea presented to the ER patient had rectal bleeding yesterday morning. Patient states she had a bowel movement and when trying to clean she found frank rectal bleeding in the commode with clots. Denies any nausea vomiting abdominal pain or diarrhea.                                                                 Hospital Course    1. Rectal bleeding - Due to acute diverticulitis. Patient has not had any further bleeds since in the ER. She was kept on clear liquid diet along with IV Cipro and Flagyl, H&H remained stable, no transfusions needed, seen by GI physician Dr. Oletta Lamas as her primary GI physician was out of town on vacation. She will be discharged on oral Cipro Flagyl for 10 more days along with soft diet my request her to follow with PCP in 3-4 days. PCP to recheck CBC next visit. Also to follow with primary GI physician in one to 2 weeks. 2. Hypertension - on pindolol. Closely follow  blood pressure trends. 3. Chronic Diastolic CHF EF 62%- appears compensated. Hold spironolactone for now due to GI bleed. 4. Sleep apnea on CPAP daily at bedtime.   Discharge diagnosis     Active Problems:   Sinoatrial node dysfunction (HCC)   Pulmonary hypertension   Ascending aortic aneurysm (HCC)   Benign essential HTN   CKD (chronic kidney disease) stage 3, GFR 30-59 ml/min   Rectal bleeding   Sigmoid diverticulitis   Acute diverticulitis    Discharge instructions    Discharge Instructions    Discharge instructions    Complete by:  As directed    Follow with Primary MD [name] Kehr, MD in 3-4 days   Get CBC, CMP, 2 view Chest X ray checked  by Primary MD in 3-4 days ( we routinely change or add medications that can affect your baseline labs and fluid status, therefore we recommend that  you get the mentioned basic workup next visit with your PCP, your PCP may decide not to get them or add new tests based on their clinical decision)  Activity: As tolerated with Full fall precautions use [name]/cane & assistance as needed  Disposition Home    Diet:   DIET SOFT .  For Heart failure patients - Check your Weight same time everyday, if you gain over 2 pounds, or you develop in leg swelling, experience more shortness of breath or chest pain, call your Primary MD immediately. Follow Cardiac Low Salt Diet and 1.5 lit/day fluid restriction.  On your next visit with your primary care physician please Get Medicines reviewed and adjusted.  Please request your Prim.MD to go over all Hospital Tests and Procedure/Radiological results at the follow up, please get all Hospital records sent to your Prim MD by signing hospital release before you go home.  If you experience worsening of your admission symptoms, develop shortness of breath, life threatening emergency, suicidal or homicidal thoughts you must seek medical attention immediately by calling 911 or calling your MD immediately  if  symptoms less severe.  You Must read complete instructions/literature along with all the possible adverse reactions/side effects for all the Medicines you take and that have been prescribed to you. Take any new Medicines after you have completely understood and accpet all the possible adverse reactions/side effects.   Do not drive, operate heavy machinery, perform activities at heights, swimming or participation in water activities or provide baby sitting services if your were admitted for syncope or siezures until you have seen by Primary MD or a Neurologist and advised to do so again.  Do not drive when taking Pain medications.    Do not take more than prescribed Pain, Sleep and Anxiety Medications  Special Instructions: If you have smoked or chewed Tobacco  in the last 2 yrs please stop smoking, stop any regular Alcohol  and or any Recreational drug use.  Wear Seat belts while driving.   Please note  You were cared for by a hospitalist during your hospital stay. If you have any questions about your discharge medications or the care you received while you were in the hospital after you are discharged, you can call the unit and asked to speak with the hospitalist on call if the hospitalist that took care of you is not available. Once you are discharged, your primary care physician will handle any further medical issues. Please note that NO REFILLS for any discharge medications will be authorized once you are discharged, as it is imperative that you return to your primary care physician (or establish a relationship with a primary care physician if you do not have one) for your aftercare needs so that they can reassess your need for medications and monitor your lab values.   Increase activity slowly    Complete by:  As directed       Discharge Medications   Allergies as of 07/02/2016      Reactions   Lactase Diarrhea   Amlodipine Besy-benazepril Hcl Swelling   Amlodipine Besylate Swelling     Clonidine Hydrochloride    REACTION: tired   Lactose Intolerance (gi) Other (See Comments)   Valsartan Other (See Comments)   Tongue swelling    Verapamil       Medication List    TAKE these medications   albuterol 108 (90 Base) MCG/ACT inhaler Commonly known as:  PROVENTIL HFA;VENTOLIN HFA Inhale 2 puffs into the lungs every 6 (  six) hours as needed for wheezing or shortness of breath.   allopurinol 300 MG tablet Commonly known as:  ZYLOPRIM TAKE 1 TABLET BY MOUTH DAILY   budesonide-formoterol 80-4.5 MCG/ACT inhaler Commonly known as:  SYMBICORT Inhale 2 puffs into the lungs 2 (two) times daily.   cetirizine 10 MG tablet Commonly known as:  ZYRTEC Take 10 mg by mouth daily as needed for allergies.   ciprofloxacin 500 MG tablet Commonly known as:  CIPRO Take 1 tablet (500 mg total) by mouth 2 (two) times daily.   clotrimazole 10 MG troche Commonly known as:  MYCELEX Take 1 tablet (10 mg total) by mouth 5 (five) times daily.   diltiazem 2 % Gel Apply 1 application topically 2 (two) times daily.   escitalopram 10 MG tablet Commonly known as:  LEXAPRO TAKE 1 TABLET (10 MG) BY MOUTH DAILY AT BEDTIME.   feeding supplement (ENSURE ENLIVE) Liqd Take 237 mLs by mouth 2 (two) times daily between meals.   fluocinonide cream 0.05 % Commonly known as:  LIDEX Apply 1 application topically daily as needed (For spots on face/itching.).   fluticasone furoate-vilanterol 200-25 MCG/INH Aepb Commonly known as:  BREO ELLIPTA Inhale 1 puff into the lungs daily.   KLOR-CON M20 20 MEQ tablet Generic drug:  potassium chloride SA TAKE 1 TABLET (20 MEQ TOTAL) BY MOUTH DAILY.   levothyroxine 100 MCG tablet Commonly known as:  SYNTHROID, LEVOTHROID Take 1 tablet (100 mcg total) by mouth daily. What changed:  when to take this   LORazepam 0.5 MG tablet Commonly known as:  ATIVAN Take 1 tab daily and 2nd as needed What changed:  how much to take  how to take this  when to  take this  reasons to take this  additional instructions   metroNIDAZOLE 500 MG tablet Commonly known as:  FLAGYL Take 1 tablet (500 mg total) by mouth every 8 (eight) hours.   oxybutynin 5 MG tablet Commonly known as:  DITROPAN Take 1 tablet (5 mg total) by mouth daily as needed.   pindolol 5 MG tablet Commonly known as:  VISKEN TAKE 1 TABLET BY MOUTH DAILY   psyllium 28 % packet Commonly known as:  METAMUCIL SMOOTH TEXTURE Take 1 packet by mouth daily with breakfast.   ranitidine 150 MG tablet Commonly known as:  ZANTAC Take 150 mg by mouth 3 (three) times daily.   spironolactone 25 MG tablet Commonly known as:  ALDACTONE Take 1 tablet (25 mg total) by mouth daily.       Follow-up Information    [name] Kehr, MD. Schedule an appointment as soon as possible for a visit in 3 day(s).   Specialty:  Internal Medicine Contact information: [place] [address] [phone]        MEDOFF,JEFFREY R, MD. Schedule an appointment as soon as possible for a visit in 1 week(s).   Specialty:  Gastroenterology Contact information: [place] [address] [phone]           Major procedures and Radiology Reports - PLEASE review detailed and final reports thoroughly  -         Ct Abdomen Pelvis W Contrast  Result Date: 06/30/2016 CLINICAL DATA:  Rectal bleeding. Left lower quadrant abdominal pain. Symptoms became worse today. Personal history of anal fissures and diverticulitis. IBS. Personal history of breast cancer. EXAM: CT ABDOMEN AND PELVIS WITH CONTRAST TECHNIQUE: Multidetector CT imaging of the abdomen and pelvis was performed using the standard protocol  following bolus administration of intravenous contrast. CONTRAST:  130mL ISOVUE-300 IOPAMIDOL (ISOVUE-300) INJECTION 61% COMPARISON:  CT of the chest, abdomen, and pelvis 02/11/2009. FINDINGS: Lower chest: Lung bases are clear without focal nodule, mass, or airspace  disease. The heart is mildly enlarged. There is no edema or effusion. Pacing wires are in place. Hepatobiliary: No focal intrahepatic biliary dilation is present. Multiple gallstones are noted. There is no inflammatory change about the gallbladder. Pancreas: No significant inflammatory changes are present. There is mild dilation of the pancreatic duct. No obstructing mass lesion is evident. Spleen: Normal in size without focal abnormality. Adrenals/Urinary Tract: Adrenal glands are normal bilaterally. A 2.5 cm cyst is present at the lower pole of the left kidney. The focally tear present. There are no stones. The urinary bladder is within normal limits. Stomach/Bowel: A small hiatal hernia is present. The stomach and duodenum are otherwise unremarkable. Small bowel is within normal limits. Appendix is visualized and normal. The ascending and transverse colon are within normal limits. Diverticular changes are present in the descending and sigmoid colon. There is focal wall thickening inflammatory change in the distal sigmoid colon. The rectum is unremarkable. No anal fissures are evident. Vascular/Lymphatic: Atherosclerotic calcifications are present in the aorta and branch vessels without aneurysm. No significant retroperitoneal or pelvic adenopathy is present. Reproductive: Hysterectomy is noted. The ovaries are not discretely visualized and may be surgically absent as well. Other: No significant free fluid or free air is present. Musculoskeletal: Degenerative endplate changes and facet arthropathy are present in the lower lumbar spine. No focal lytic or blastic lesions are present. Vertebral body heights are maintained. Bony pelvis is intact. The hips are located. Degenerative changes are noted at the SI joints bilaterally. IMPRESSION: 1. Inflammatory changes of the sigmoid colon with diverticula compatible with acute diverticulitis. There is no abscess or free air. No complicating features are evident. 2.  Additional diverticular present within the remaining sigmoid colon and descending colon without other inflammatory change. 3. Small hiatal hernia. 4. Mild atherosclerotic changes within the aorta and branch vessels. 5. Mild cardiomegaly without failure. Electronically Signed   By: San Morelle M.D.   On: 06/30/2016 20:22   Dg Abd Portable 1v  Result Date: 07/01/2016 CLINICAL DATA:  Nausea and vomiting EXAM: PORTABLE ABDOMEN - 1 VIEW COMPARISON:  06/30/2016 FINDINGS: Scattered large and small bowel gas is seen. No obstructive changes are seen. No free air is noted. Mild degenerative change of the lumbar spine is noted. IMPRESSION: No acute abnormality seen. Electronically Signed   By: Inez Catalina M.D.   On: 07/01/2016 15:49    Micro Results     Recent Results (from the past 240 hour(s))  Blood culture (routine x 2)     Status: None (Preliminary result)   Collection Time: 06/30/16  9:25 PM  Result Value Ref Range Status   Specimen Description BLOOD RIGHT FOREARM  Final   Special Requests BOTTLES DRAWN AEROBIC AND ANAEROBIC 5CC  Final   Culture NO GROWTH < 24 HOURS  Final   Report Status PENDING  Incomplete  Blood culture (routine x 2)     Status: None (Preliminary result)   Collection Time: 06/30/16  9:30 PM  Result Value Ref Range Status   Specimen Description BLOOD RIGHT HAND  Final   Special Requests BOTTLES DRAWN AEROBIC AND ANAEROBIC 5CC  Final   Culture NO GROWTH < 24 HOURS  Final   Report Status PENDING  Incomplete    Today   Subjective  [name] today has no headache,no chest abdominal pain,no new weakness tingling or numbness, feels much better wants to go home today.     Objective   Blood pressure 139/72, pulse 61, temperature 98 F (36.7 C), temperature source Oral, resp. rate 18, height 5\' 4"  (1.626 m), weight 73.6 kg (162 lb 4.8 oz), SpO2 100 %.   Intake/Output Summary (Last 24 hours) at 07/02/16 1045 Last data filed at 07/02/16 0936  Gross per 24  hour  Intake              940 ml  Output              300 ml  Net              640 ml    Exam Awake Alert, Oriented x 3, No new F.N deficits, Normal affect [place].AT,PERRAL Supple Neck,No JVD, No cervical lymphadenopathy appriciated.  Symmetrical Chest wall movement, Good air movement bilaterally, CTAB RRR,No Gallops,Rubs or new Murmurs, No Parasternal Heave +ve B.Sounds, Abd Soft, Non tender, No organomegaly appriciated, No rebound -guarding or rigidity. No Cyanosis, Clubbing or edema, No new Rash or bruise   Data Review   CBC w Diff:  Lab Results  Component Value Date   WBC 4.4 07/01/2016   HGB 11.5 (L) 07/02/2016   HGB 13.7 10/30/2008   HCT 34.5 (L) 07/02/2016   HCT 40.0 10/30/2008   PLT 166 07/01/2016   PLT 234 10/30/2008   LYMPHOPCT 25.0 10/10/2015   LYMPHOPCT 25.3 10/30/2008   MONOPCT 7.8 10/10/2015   MONOPCT 11.9 10/30/2008   EOSPCT 2.5 10/10/2015   EOSPCT 1.7 10/30/2008   BASOPCT 0.8 10/10/2015   BASOPCT 0.5 10/30/2008    CMP:  Lab Results  Component Value Date   NA 136 07/02/2016   K 3.7 07/02/2016   CL 107 07/02/2016   CO2 22 07/02/2016   BUN 17 07/02/2016   CREATININE 0.93 07/02/2016   PROT 6.9 06/30/2016   ALBUMIN 3.6 06/30/2016   BILITOT 1.0 06/30/2016   ALKPHOS 63 06/30/2016   AST 22 06/30/2016   ALT 13 (L) 06/30/2016  .   Total Time in preparing paper work, data evaluation and todays exam - 35 minutes  Thurnell Lose M.D on 07/02/2016 at 10:45 AM  [facility]   Office  [phone]

## 2016-07-02 NOTE — Progress Notes (Signed)
Pt. C/o of nausea. PRN Zofran given. RN will continue to monitor. Jena Tegeler, Katherine Roan

## 2016-07-02 NOTE — Consult Note (Signed)
   [facility] CM Inpatient Consult   07/02/2016  [name] July 20, 1929 [id number]  Patient screened for potential [place] Management services. Patient is eligible for [facility] Care Management services under patient's Medicare  Plan and has been assigned to [facility] HF follow up calls by the inpatient RNCM, Hassan Rowan. Met with the patient to explain post follow up calls and she states, "Oh, it's this something new".  Explained for patients who has been in the hospital.  Patient verbally consents to follow up.  She states, "This is so nice, so nice, thank you. I don't think I have heart failure though because Dr. Aundra Dubin did a test for that and I don't have it."  Explained that if she has symptoms she could speak to a live nurse. A brochure and magnet with contact information was given.  Please place a [facility] Care Management consult or for questions contact:   Natividad Brood, RN BSN [place] [facility] Liaison  [phone] business mobile phone Toll free office [phone]

## 2016-07-02 NOTE — Progress Notes (Signed)
Pt has orders to be discharged. Discharge instructions given and pt has no additional questions at this time. Medication regimen reviewed and pt educated. Pt verbalized understanding and has no additional questions. Telemetry box removed. IV removed and site in good condition. Pt stable and waiting for transportation.  Arlene Brickel RN 

## 2016-07-05 ENCOUNTER — Ambulatory Visit: Payer: Medicare Other | Admitting: Psychology

## 2016-07-05 LAB — CULTURE, BLOOD (ROUTINE X 2)
CULTURE: NO GROWTH
Culture: NO GROWTH

## 2016-07-07 ENCOUNTER — Other Ambulatory Visit: Payer: Self-pay

## 2016-07-07 DIAGNOSIS — K921 Melena: Secondary | ICD-10-CM | POA: Diagnosis not present

## 2016-07-07 NOTE — Patient Outreach (Signed)
[facility]) Care Management  07/07/2016  [name] [birth year] [id number]   REFERRAL DATE: 07/06/16 REFERRAL SOURCE; EMMI heart failure/ status post hospitalization 06/30/16 - 07/02/16. REFERRAL REASON: EMMI heart failure RED alert for:  Have a way to get to follow - up appointment: NO  Telephone call to patient regarding EMMII heart failure alert. Unable to reach patient. HIPAA compliant voice message left with call back phone number.   PLAN; RNCM will attempt 2nd telephone call to patient within  1 week   Quinn Plowman RN,BSN,CCM [facility] Telephonic  [phone]

## 2016-07-08 ENCOUNTER — Encounter: Payer: Self-pay | Admitting: Family

## 2016-07-08 ENCOUNTER — Other Ambulatory Visit (INDEPENDENT_AMBULATORY_CARE_PROVIDER_SITE_OTHER): Payer: Medicare Other

## 2016-07-08 ENCOUNTER — Other Ambulatory Visit: Payer: Self-pay

## 2016-07-08 ENCOUNTER — Ambulatory Visit (INDEPENDENT_AMBULATORY_CARE_PROVIDER_SITE_OTHER): Payer: Medicare Other | Admitting: Family

## 2016-07-08 VITALS — BP 132/86 | HR 70 | Temp 98.2°F | Resp 16 | Ht 64.0 in | Wt 168.0 lb

## 2016-07-08 DIAGNOSIS — K5792 Diverticulitis of intestine, part unspecified, without perforation or abscess without bleeding: Secondary | ICD-10-CM

## 2016-07-08 LAB — CBC
HCT: 37.9 % (ref 36.0–46.0)
HEMOGLOBIN: 12.8 g/dL (ref 12.0–15.0)
MCHC: 33.8 g/dL (ref 30.0–36.0)
MCV: 100.1 fl — ABNORMAL HIGH (ref 78.0–100.0)
PLATELETS: 199 10*3/uL (ref 150.0–400.0)
RBC: 3.79 Mil/uL — ABNORMAL LOW (ref 3.87–5.11)
RDW: 14.8 % (ref 11.5–15.5)
WBC: 4 10*3/uL (ref 4.0–10.5)

## 2016-07-08 NOTE — Progress Notes (Signed)
Subjective:    Patient ID: [name], female    DOB: October 15, 1929, 83 y.o.   MRN: [id number]  Chief Complaint  Patient presents with  . Hospitalization Follow-up    diverticulitis, feels weak     HPI:  [name] is a 83 y.o. female who  has a past medical history of Anxiety; Bradycardia; Breast cancer, left breast ([place]) (1995); Breast cancer, right breast ([place]) (2002); CHF (congestive heart failure) ([place]); Depression; Discoid lupus; Full dentures; GERD (gastroesophageal reflux disease); Glaucoma; Gout; echocardiogram; exercise stress test; Hypertension; Hypothyroidism; IBS (irritable bowel syndrome); IBS (irritable bowel syndrome); Incontinence; OSA on CPAP; Osteoarthritis; Osteopenia; Pacemaker (2009); Thyroid disease; and Wears hearing aid. and presents today for a follow up office visit.     This is a new problem. Recently evaluated in the emergency department and admitted to the hospital with the chief complaint of rectal bleeding with blood clots noted. CT scan showed inflammatory changes of the sigmoid colon with diverticula compatible with acute diverticulitis. She was noted to have a small hiatal hernia and additional diverticular present with no inflammatory changes. She was kept on clear liquid diet and started on IV ciprofloxacin and Flagyl. Hemoglobin and hematocrit remained stable with no need for transfusions. Hospitalist recommend follow-up with primary care to recheck CBC and follow-up with gastroenterologist. All hospital records, labs, and imaging reviewed in detail.  Since leaving the hospital, she reports she has been feeling weak but overall improved. Does not feel as well as she would like to. Has also seen GI yesterday and has scheduled a colonoscpy for next month to check for colon cancer. She is continuing with the soft diet until seen again by GI.  Denies any nausea, vomiting or constipation. She has had multiple bowel movements but does have history of IBS. Appetite is  also decreased. She follows a vegan diet.   Allergies  Allergen Reactions  . Lactase Diarrhea  . Amlodipine Besy-Benazepril Hcl Swelling  . Amlodipine Besylate Swelling  . Clonidine Hydrochloride     REACTION: tired  . Lactose Intolerance (Gi) Other (See Comments)  . Valsartan Other (See Comments)    Tongue swelling   . Verapamil       Outpatient Medications Prior to Visit  Medication Sig Dispense Refill  . allopurinol (ZYLOPRIM) 300 MG tablet TAKE 1 TABLET BY MOUTH DAILY 30 tablet 1  . cetirizine (ZYRTEC) 10 MG tablet Take 10 mg by mouth daily as needed for allergies.    [name] escitalopram (LEXAPRO) 10 MG tablet TAKE 1 TABLET (10 MG) BY MOUTH DAILY AT BEDTIME. 90 tablet 0  . feeding supplement, ENSURE ENLIVE, (ENSURE ENLIVE) LIQD Take 237 mLs by mouth 2 (two) times daily between meals. 237 mL 12  . fluocinonide cream (LIDEX) 9.38 % Apply 1 application topically daily as needed (For spots on face/itching.).     [name] KLOR-CON M20 20 MEQ tablet TAKE 1 TABLET (20 MEQ TOTAL) BY MOUTH DAILY. 30 tablet 3  . levothyroxine (SYNTHROID, LEVOTHROID) 100 MCG tablet Take 1 tablet (100 mcg total) by mouth daily. (Patient taking differently: Take 100 mcg by mouth daily before breakfast. ) 90 tablet 3  . LORazepam (ATIVAN) 0.5 MG tablet Take 1 tab daily and 2nd as needed (Patient taking differently: Take 0.5 mg by mouth every 6 (six) hours as needed for anxiety. Take 1 tab daily and 2nd as needed) 45 tablet 1  . oxybutynin (DITROPAN) 5 MG tablet Take 1 tablet (5 mg total) by mouth daily as needed. [place]  tablet 3  . pindolol (VISKEN) 5 MG tablet TAKE 1 TABLET BY MOUTH DAILY 90 tablet 1  . psyllium (METAMUCIL SMOOTH TEXTURE) 28 % packet Take 1 packet by mouth daily with breakfast.    . ranitidine (ZANTAC) 150 MG tablet Take 150 mg by mouth 3 (three) times daily.    [name] spironolactone (ALDACTONE) 25 MG tablet Take 1 tablet (25 mg total) by mouth daily. 90 tablet 1  . albuterol (PROVENTIL HFA;VENTOLIN HFA) 108 (90  Base) MCG/ACT inhaler Inhale 2 puffs into the lungs every 6 (six) hours as needed for wheezing or shortness of breath. 1 Inhaler 6  . budesonide-formoterol (SYMBICORT) 80-4.5 MCG/ACT inhaler Inhale 2 puffs into the lungs 2 (two) times daily. (Patient not taking: Reported on 06/30/2016) 1 Inhaler 5  . ciprofloxacin (CIPRO) 500 MG tablet Take 1 tablet (500 mg total) by mouth 2 (two) times daily. 20 tablet 0  . clotrimazole (MYCELEX) 10 MG troche Take 1 tablet (10 mg total) by mouth 5 (five) times daily. (Patient not taking: Reported on 06/30/2016) 35 tablet 0  . diltiazem 2 % GEL Apply 1 application topically 2 (two) times daily. (Patient not taking: Reported on 06/30/2016) 30 g 0  . fluticasone furoate-vilanterol (BREO ELLIPTA) 200-25 MCG/INH AEPB Inhale 1 puff into the lungs daily. 60 each 5  . metroNIDAZOLE (FLAGYL) 500 MG tablet Take 1 tablet (500 mg total) by mouth every 8 (eight) hours. 30 tablet 0   No facility-administered medications prior to visit.       Past Surgical History:  Procedure Laterality Date  . BREAST BIOPSY Left 1995  . BREAST LUMPECTOMY Left 1995   axillary node dissectoin  . CARDIAC CATHETERIZATION N/A 02/13/2016   Procedure: Right Heart Cath;  Surgeon: Larey Dresser, MD;  Location: [facility];  Service: Cardiovascular;  Laterality: N/A;  . CATARACT EXTRACTION W/ INTRAOCULAR LENS  IMPLANT, BILATERAL Bilateral 06/2009 - 12.2915   left - right  . DORSAL COMPARTMENT RELEASE  2000   left  . HAMMER TOE SURGERY Left 2004  . HEMORRHOID BANDING    . INSERT / REPLACE / REMOVE PACEMAKER  2009  . KNEE ARTHROSCOPY Left 08/29/2012   Procedure: LEFT KNEE ARTHROSCOPY ;  Surgeon: Hessie Dibble, MD;  Location: [facility];  Service: Orthopedics;  Laterality: Left;  [name] MASTECTOMY Bilateral 09/2000   nbx  . TRIGGER FINGER RELEASE Right 07/31/2013   Procedure: EXCISE MASS RIGHT INDEX A-1 PLLLEY RELEASE A-1 RIGHT INDEX;  Surgeon: Cammie Sickle., MD;  Location:  [facility];  Service: Orthopedics;  Laterality: Right;  [name] VAGINAL HYSTERECTOMY        Past Medical History:  Diagnosis Date  . Anxiety   . Bradycardia   . Breast cancer, left breast ([place]) 1995  . Breast cancer, right breast ([place]) 2002  . CHF (congestive heart failure) ([place])   . Depression    Dr Cheryln Manly  . Discoid lupus    skin  . Full dentures   . GERD (gastroesophageal reflux disease)   . Glaucoma   . Gout   . Hx of echocardiogram    a.  Echocardiogram (03/2011): Mild LVH, EF 82-50%, grade 1 diastolic dysfunction, mild MR, mild to moderate TR, PASP 42;  b. Echo (05/2013):  Mod LVH, EF 65-70%, no WMA, Gr 1 DD, mild MR, mod TR, PASP 39  . Hx of exercise stress test    a. ETT-echocardiogram (06/08/11): Sub-optimal exercise. Test stopped early due to dizziness  and hypotension. EF 60%.  Non-diagnostic.;   b. Lexiscan Myoview (06/2013):  Diaphragmatic attenuation, no ischemia, EF 61%.  Low Risk  . Hypertension   . Hypothyroidism   . IBS (irritable bowel syndrome)   . IBS (irritable bowel syndrome)   . Incontinence   . OSA on CPAP    "quit wearing mask; retested; not enough information; to be tested again 04/08/2016"  . Osteoarthritis   . Osteopenia   . Pacemaker 2009   Brady//Chronotropic incompetence with normal pacemaker function  . Thyroid disease   . Wears hearing aid    both ears      Review of Systems  Constitutional: Positive for fatigue. Negative for chills and fever.  Respiratory: Negative for cough, chest tightness, shortness of breath and wheezing.   Cardiovascular: Negative for chest pain, palpitations and leg swelling.  Gastrointestinal: Positive for diarrhea. Negative for abdominal distention, abdominal pain, anal bleeding, blood in stool, constipation, nausea, rectal pain and vomiting.  Neurological: Positive for weakness.      Objective:    BP 132/86 (BP Location: Left Arm, Patient Position: Sitting, Cuff Size: Large)   Pulse 70   Temp  98.2 F (36.8 C) (Oral)   Resp 16   Ht 5\' 4"  (1.626 m)   Wt 168 lb (76.2 kg)   SpO2 95%   BMI 28.84 kg/m  Nursing note and vital signs reviewed.  Physical Exam  Constitutional: She is oriented to person, place, and time. She appears well-developed and well-nourished. No distress.  Cardiovascular: Normal rate, regular rhythm, normal heart sounds and intact distal pulses.   Pulmonary/Chest: Effort normal and breath sounds normal.  Abdominal: Soft. Bowel sounds are normal. She exhibits no distension and no mass. There is no tenderness. There is no rebound and no guarding.  Neurological: She is alert and oriented to person, place, and time.  Skin: Skin is warm and dry.  Psychiatric: She has a normal mood and affect. Her behavior is normal. Judgment and thought content normal.       Assessment & Plan:   Problem List Items Addressed This Visit      Other   Acute diverticulitis - Primary    Symptoms of acute diverticulitis appears improving and recently completed ciprofloxacin which was discontinued by gastroenterology secondary to joint pains. No further evidence of lower abdominal pain with continued loose bowel movements in the setting of previous irritable bowel syndrome with diarrhea. Weakness most likely associated with decreased oral intake which is complicated by a vegan diet. Gastroenterology recommends continued soft diet with follow-up colonoscopy in one month. Obtain CBC given most recent gradual decrease in hemoglobin during hospitalization. Continue to monitor and follow-up if symptoms worsen or do not continue to improve.      Relevant Orders   CBC (Completed)       I have discontinued Ms. Stabler's albuterol, fluticasone furoate-vilanterol, budesonide-formoterol, clotrimazole, diltiazem, ciprofloxacin, and metroNIDAZOLE. I am also having her maintain her levothyroxine, fluocinonide cream, spironolactone, pindolol, ranitidine, cetirizine, psyllium, allopurinol, KLOR-CON M20,  feeding supplement (ENSURE ENLIVE), LORazepam, escitalopram, and oxybutynin.   Follow-up: Return if symptoms worsen or fail to improve.  Mauricio Po, FNP

## 2016-07-08 NOTE — Patient Instructions (Addendum)
Thank you for choosing [facility].  SUMMARY AND INSTRUCTIONS:  Please continue to follow a soft diet.  Increase caloric intake as you are able.   Frequent small meals throughout the day.    Follow up:  If your symptoms worsen or fail to improve, please contact our office for further instruction, or in case of emergency go directly to the emergency room at the closest medical facility.    Soft-Food Meal Plan A soft-food meal plan includes foods that are safe and easy to swallow. This meal plan typically is used:  If you are having trouble chewing or swallowing foods.  As a transition meal plan after only having had liquid meals for a long period. What do I need to know about the soft-food meal plan? A soft-food meal plan includes tender foods that are soft and easy to chew and swallow. In most cases, bite-sized pieces of food are easier to swallow. A bite-sized piece is about  inch or smaller. Foods in this plan do not need to be ground or pureed. Foods that are very hard, crunchy, or sticky should be avoided. Also, breads, cereals, yogurts, and desserts with nuts, seeds, or fruits should be avoided. What foods can I eat? Grains  Rice and wild rice. Moist bread, dressing, pasta, and noodles. Well-moistened dry or cooked cereals, such as farina (cooked wheat cereal), oatmeal, or grits. Biscuits, breads, muffins, pancakes, and waffles that have been well moistened. Vegetables  Shredded lettuce. Cooked, tender vegetables, including potatoes without skins. Vegetable juices. Broths or creamed soups made with vegetables that are not stringy or chewy. Strained tomatoes (without seeds). Fruits  Canned or well-cooked fruits. Soft (ripe), peeled fresh fruits, such as peaches, nectarines, kiwi, cantaloupe, honeydew melon, and watermelon (without seeds). Soft berries with small seeds, such as strawberries. Fruit juices (without pulp). Meats and Other Protein Sources  Moist, tender, lean  beef. Mutton. Lamb. Veal. Chicken. [place]. Liver. Ham. Fish without bones. Eggs. Dairy  Milk, milk drinks, and cream. Plain cream cheese and cottage cheese. Plain yogurt. Sweets/Desserts  Flavored gelatin desserts. Custard. Plain ice cream, frozen yogurt, sherbet, milk shakes, and malts. Plain cakes and cookies. Plain hard candy. Other  Butter, margarine (without trans fat), and cooking oils. Mayonnaise. Cream sauces. Mild spices, salt, and sugar. Syrup, molasses, honey, and jelly. The items listed above may not be a complete list of recommended foods or beverages. Contact your dietitian for more options.  What foods are not recommended? Grains  Dry bread, toast, crackers that have not been moistened. Coarse or dry cereals, such as bran, granola, and shredded wheat. Tough or chewy crusty breads, such as [place] bread or baguettes. Vegetables  Corn. Raw vegetables except shredded lettuce. Cooked vegetables that are tough or stringy. Tough, crisp, fried potatoes and potato skins. Fruits  Fresh fruits with skins or seeds or both, such as apples, pears, or grapes. Stringy, high-pulp fruits, such as papaya, pineapple, coconut, or mango. Fruit leather, fruit roll-ups, and all dried fruits. Meats and Other Protein Sources  Sausages and hot dogs. Meats with gristle. Fish with bones. Nuts, seeds, and chunky peanut or other nut butters. Sweets/Desserts  Cakes or cookies that are very dry or chewy. The items listed above may not be a complete list of foods and beverages to avoid. Contact your dietitian for more information.  This information is not intended to replace advice given to you by your health care provider. Make sure you discuss any questions you have with your health care provider.  Document Released: 07/20/2007 Document Revised: 09/18/2015 Document Reviewed: 03/09/2013 Elsevier Interactive Patient Education  2017 [employer].

## 2016-07-08 NOTE — Patient Outreach (Signed)
[facility]) Care Management  07/08/2016  [name] [birth year] [id number]   REFERRAL DATE; 07/08/16 REFERRAL SOURCE:  EMMI heart failure status post hospital admission REFERRAL REASON: Have a way to get to follow up appointment: NO   Telephone call to patient regarding EMMI heart failure red alert. HIPAA verified with patient.  Discussed EMMI  Heart failure program with patient. Patient verbally agreed to services.   Patient states she saw Dr. Earlean Shawl on yesterday. States Dr. Earlean Shawl discontinued her antibiotics for the diverticulitis. Patient states she is scheduled to have a colonoscopy on 08/07/16. Patient states she will need transportation assistance for this appointment.  Patient states she has a follow up appointment scheduled with her primary MD office on 07/13/16. Patient states she feels weak.  States she has been on a soft diet and thinks her weakness may be from this. Patient states Dr. Earlean Shawl wants her to remain on a soft diet for the next month.  Patient states she has been told in the past she has heart failure but has not had much issue with this.  Patient states she had had more problem with intestinal issues.  Patient states she has a son and daughter that live fairly close. Patient states she is unable to ask her son or daughter for assistance to her appointments because they do not believe in doctors. Patient states she does  not have support from them regarding her health care.   Patient states she has a car and is able to drive but there are times she is not feeling well enough to drive. Patient states she will also ask friend to take her to her appointments as well.  Patient states she would like to discuss transportation assistance with Education officer, museum.   Patient states she has been dealing with stomach issues such as  irritable bowel syndrome since 1997 along with diverticulosis.  Patient states because of this she has had a lot of stress over the years.  Patient  states she goes to  Counseling and is on medication for depression.  Patient reports she also has sleep apnea and uses cpap machine.  RNCM informed patient she will refer her to [facility] care management social worker for transportation assistance.  Patient verbally agreed to  follow up with  telephonic RNCM  For additional education regarding diverticulosis and heart failure.  ASSESSMENT:  EMM heart failure referral.    PLAN:  RNCM will follow up with patient within 2 weeks. RNCM will send patient [facility] care management welcome packet/ consent RNCM will send patients primary MD involvement letter.   Quinn Plowman RN,BSN,CCM [facility] Telephonic  [phone]

## 2016-07-08 NOTE — Assessment & Plan Note (Signed)
Symptoms of acute diverticulitis appears improving and recently completed ciprofloxacin which was discontinued by gastroenterology secondary to joint pains. No further evidence of lower abdominal pain with continued loose bowel movements in the setting of previous irritable bowel syndrome with diarrhea. Weakness most likely associated with decreased oral intake which is complicated by a vegan diet. Gastroenterology recommends continued soft diet with follow-up colonoscopy in one month. Obtain CBC given most recent gradual decrease in hemoglobin during hospitalization. Continue to monitor and follow-up if symptoms worsen or do not continue to improve.

## 2016-07-09 ENCOUNTER — Encounter: Payer: Self-pay | Admitting: *Deleted

## 2016-07-09 NOTE — Addendum Note (Signed)
Addended by: Quinn Plowman E on: 07/09/2016 10:34 AM   Modules accepted: Orders

## 2016-07-10 NOTE — Telephone Encounter (Unsigned)
This encounter was created in error - please disregard.

## 2016-07-12 ENCOUNTER — Other Ambulatory Visit: Payer: Self-pay

## 2016-07-12 NOTE — Patient Outreach (Addendum)
[facility]) Care Management  [place]  07/12/2016   [name] 12/20/1929 [id number]   Follow up for EMMI heart failure red alert: Weighed today: NO Initial telephone assessment Subjective: Telephone call to patient regarding EMMI heart failure red alert and initial telephone assessment.  HIPAA verified with patient. Patient states she weighed today. Reports todays weight at 162 lbs. Patient states she normally fluctuates 1 to 2 lbs.  Patient denies any swelling, shortness of breath, or chest discomfort. Patient states she does not eat any salt. Patient states she is vegan. Patient states she has to be due to the many issues she has with her intestines. Patient states she is currently on a soft diet as ordered by her doctor. Patient states she drinks an organic nutritional drink as recommended by her doctor.  Patient denies any pain. Patient states she has discomfort/ soreness when she has bowel movements due to the anal fistula. Patient states she has several bowel movements a day and she takes metamucil as instructed by her doctor. Patient states she doctor wants her to have bowel movements regularly due to the diverticulitis.  Patient states she continues to take her medications as prescribed. Patient states she has a follow up appointment with Dr. Golden Hurter next week regarding sleep apnea. Patient states she saw Dr. Algernon Huxley last year. States she does not have a return follow up visit because office said they would call her. Patient states she sees Dr. Caryl Comes regarding her pacemaker 1 time per year.  Patient states she is going to call [facility] regarding the tubing for her sleep apnea machine.  Patient states she does have a lack of energy. Patient states she has reported this to her doctor. Patient states her doctor said its because she is on a soft diet and is not taking in as many calories.  Patient states she is having some burning when she urinates.  States she has an appointment with her primary MD on tomorrow and will report symptoms.  RNCM advised patient to take her medication as prescribed. RNCM advised patient to keep follow up appointments with doctor.  RNCM advised patient to adhere to specific diet restriction for diverticulitis.     Objective: see assessment   Encounter Medications:  Outpatient Encounter Prescriptions as of 07/12/2016  Medication Sig  . allopurinol (ZYLOPRIM) 300 MG tablet TAKE 1 TABLET BY MOUTH DAILY  . cetirizine (ZYRTEC) 10 MG tablet Take 10 mg by mouth daily as needed for allergies.  [name] escitalopram (LEXAPRO) 10 MG tablet TAKE 1 TABLET (10 MG) BY MOUTH DAILY AT BEDTIME.  . feeding supplement, ENSURE ENLIVE, (ENSURE ENLIVE) LIQD Take 237 mLs by mouth 2 (two) times daily between meals.  . fluocinonide cream (LIDEX) 1.01 % Apply 1 application topically daily as needed (For spots on face/itching.).   [name] KLOR-CON M20 20 MEQ tablet TAKE 1 TABLET (20 MEQ TOTAL) BY MOUTH DAILY.  [name] levothyroxine (SYNTHROID, LEVOTHROID) 100 MCG tablet Take 1 tablet (100 mcg total) by mouth daily. (Patient taking differently: Take 100 mcg by mouth daily before breakfast. )  . LORazepam (ATIVAN) 0.5 MG tablet Take 1 tab daily and 2nd as needed (Patient taking differently: Take 0.5 mg by mouth every 6 (six) hours as needed for anxiety. Take 1 tab daily and 2nd as needed)  . oxybutynin (DITROPAN) 5 MG tablet Take 1 tablet (5 mg total) by mouth daily as needed.  . pindolol (VISKEN) 5 MG tablet TAKE 1 TABLET BY MOUTH  DAILY  . psyllium (METAMUCIL SMOOTH TEXTURE) 28 % packet Take 1 packet by mouth daily with breakfast.  . ranitidine (ZANTAC) 150 MG tablet Take 150 mg by mouth 3 (three) times daily.  [name] spironolactone (ALDACTONE) 25 MG tablet Take 1 tablet (25 mg total) by mouth daily.   No facility-administered encounter medications on file as of 07/12/2016.     Functional Status:  In your present state of health, do you have any difficulty  performing the following activities: 07/12/2016 06/30/2016  Hearing? Tempie Donning  Vision? N N  Difficulty concentrating or making decisions? N N  Walking or climbing stairs? N N  Dressing or bathing? N N  Doing errands, shopping? N N  Preparing Food and eating ? N -  Using the Toilet? N -  In the past six months, have you accidently leaked urine? Y -  Do you have problems with loss of bowel control? N -  Managing your Medications? N -  Managing your Finances? N -  Housekeeping or managing your Housekeeping? N -  Some recent data might be hidden    Fall/Depression Screening: PHQ 2/9 Scores 07/12/2016 07/08/2016 07/31/2014  PHQ - 2 Score 2 2 2   PHQ- 9 Score 10 10 -   Fall Risk  07/12/2016 10/10/2015 07/31/2014  Falls in the past year? No No Yes  Number falls in past yr: - - 1  Injury with Fall? - - No    THN CM Care Plan Problem One     Most Recent Value  Care Plan Problem One  potential for readmission due to recent hospitalization  Role Documenting the Problem One  Care Management Telephonic [place] for Problem One  Active  THN Long Term Goal (31-90 days)  Patient will report no hospital readmission within 31 days  THN Long Term Goal Start Date  07/12/16  Interventions for Problem One Long Term Goal  RNCM sent patient EMMI education material regarding Diverticulitis, RNCM advised patient to keep follow up appointments with doctor, take medication as prescribed, report symptoms to doctor  [facility] CM Short Term Goal #1 (0-30 days)  Patient will report symptoms of diverticulitis within 3 weeks  THN CM Short Term Goal #1 Start Date  07/12/16  Interventions for Short Term Goal #1  RNCM disucssed signs of diverticulitis with patient. EMMI education material sent to patient.   THN CM Short Term Goal #2 (0-30 days)  Patient will report she has had her follow up visit with her doctor within 3 weeks.  THN CM Short Term Goal #2 Start Date  07/12/16  Interventions for Short Term Goal #2  RNCM  advised patient to keep follow up appointment with her doctor and reports signs/ symptoms as soon as possible to doctor.     THN CM Care Plan Problem Two     Most Recent Value  Care Plan Problem Two  patient reports burning upon urination  Role Documenting the Problem Two  Care Management Telephonic Coordinator  Care Plan for Problem Two  Active  THN CM Short Term Goal #1 (0-30 days)  Patient will report she has follow up with her primary MD regarding new onset of urinary symptoms within 2 weeks.   THN CM Short Term Goal #1 Start Date  07/12/16  Interventions for Short Term Goal #2   RNCM advised patient to keep scheduled follow up appointment with primary MD and report urinary symptoms.       Assessment: patient will benefit from ongoing  education regarding diverticulitis.  Plan: RNCM will follow up with patient within 3 weeks.  RNCM will send patient EMMI education material regarding diverticulitis.  Quinn Plowman RN,BSN,CCM [facility] Telephonic  [phone]

## 2016-07-13 ENCOUNTER — Ambulatory Visit (INDEPENDENT_AMBULATORY_CARE_PROVIDER_SITE_OTHER): Payer: Medicare Other | Admitting: Internal Medicine

## 2016-07-13 ENCOUNTER — Encounter: Payer: Self-pay | Admitting: Internal Medicine

## 2016-07-13 VITALS — BP 138/82 | HR 66 | Temp 97.9°F | Resp 16 | Ht 64.0 in | Wt 166.0 lb

## 2016-07-13 DIAGNOSIS — K5732 Diverticulitis of large intestine without perforation or abscess without bleeding: Secondary | ICD-10-CM

## 2016-07-13 DIAGNOSIS — I2510 Atherosclerotic heart disease of native coronary artery without angina pectoris: Secondary | ICD-10-CM | POA: Diagnosis not present

## 2016-07-13 DIAGNOSIS — G9332 Myalgic encephalomyelitis/chronic fatigue syndrome: Secondary | ICD-10-CM

## 2016-07-13 DIAGNOSIS — R5382 Chronic fatigue, unspecified: Secondary | ICD-10-CM | POA: Diagnosis not present

## 2016-07-13 DIAGNOSIS — D500 Iron deficiency anemia secondary to blood loss (chronic): Secondary | ICD-10-CM

## 2016-07-13 DIAGNOSIS — K5792 Diverticulitis of intestine, part unspecified, without perforation or abscess without bleeding: Secondary | ICD-10-CM

## 2016-07-13 NOTE — Progress Notes (Signed)
Subjective:  Patient ID: [name], female    DOB: 1929/06/14  Age: 83 y.o. MRN: [id number]  CC: Follow-up (HTN, Depression., hypothyroidism, )   HPI [name] presents for post-hosp f/u: she was at the hospital with diverticulitis and rectal bleeding - d/c'd on 07/02/16. She saw Dr Earlean Shawl after her d/c. Off abx. C/o weakness...  Outpatient Medications Prior to Visit  Medication Sig Dispense Refill  . allopurinol (ZYLOPRIM) 300 MG tablet TAKE 1 TABLET BY MOUTH DAILY 30 tablet 1  . cetirizine (ZYRTEC) 10 MG tablet Take 10 mg by mouth daily as needed for allergies.    [name] escitalopram (LEXAPRO) 10 MG tablet TAKE 1 TABLET (10 MG) BY MOUTH DAILY AT BEDTIME. 90 tablet 0  . feeding supplement, ENSURE ENLIVE, (ENSURE ENLIVE) LIQD Take 237 mLs by mouth 2 (two) times daily between meals. 237 mL 12  . fluocinonide cream (LIDEX) 8.12 % Apply 1 application topically daily as needed (For spots on face/itching.).     [name] KLOR-CON M20 20 MEQ tablet TAKE 1 TABLET (20 MEQ TOTAL) BY MOUTH DAILY. 30 tablet 3  . levothyroxine (SYNTHROID, LEVOTHROID) 100 MCG tablet Take 1 tablet (100 mcg total) by mouth daily. (Patient taking differently: Take 100 mcg by mouth daily before breakfast. ) 90 tablet 3  . LORazepam (ATIVAN) 0.5 MG tablet Take 1 tab daily and 2nd as needed (Patient taking differently: Take 0.5 mg by mouth every 6 (six) hours as needed for anxiety. Take 1 tab daily and 2nd as needed) 45 tablet 1  . oxybutynin (DITROPAN) 5 MG tablet Take 1 tablet (5 mg total) by mouth daily as needed. 30 tablet 3  . pindolol (VISKEN) 5 MG tablet TAKE 1 TABLET BY MOUTH DAILY 90 tablet 1  . psyllium (METAMUCIL SMOOTH TEXTURE) 28 % packet Take 1 packet by mouth daily with breakfast.    . ranitidine (ZANTAC) 150 MG tablet Take 150 mg by mouth 3 (three) times daily.    [name] spironolactone (ALDACTONE) 25 MG tablet Take 1 tablet (25 mg total) by mouth daily. 90 tablet 1   No facility-administered medications prior to visit.      ROS Review of Systems  Constitutional: Positive for fatigue. Negative for activity change, appetite change, chills and unexpected weight change.  HENT: Negative for congestion, mouth sores and sinus pressure.   Eyes: Negative for visual disturbance.  Respiratory: Negative for cough and chest tightness.   Gastrointestinal: Positive for abdominal pain. Negative for diarrhea and nausea.  Genitourinary: Negative for difficulty urinating, frequency and vaginal pain.  Musculoskeletal: Negative for back pain and gait problem.  Skin: Negative for pallor and rash.  Neurological: Positive for weakness. Negative for dizziness, tremors, numbness and headaches.  Psychiatric/Behavioral: Negative for confusion and sleep disturbance.    Objective:  BP 138/82   Pulse 66   Temp 97.9 F (36.6 C) (Oral)   Resp 16   Ht 5\' 4"  (1.626 m)   Wt 166 lb (75.3 kg)   SpO2 97%   BMI 28.49 kg/m   BP Readings from Last 3 Encounters:  07/13/16 138/82  07/08/16 132/86  07/02/16 139/72    Wt Readings from Last 3 Encounters:  07/13/16 166 lb (75.3 kg)  07/12/16 162 lb (73.5 kg)  07/08/16 168 lb (76.2 kg)    Physical Exam  Constitutional: She appears well-developed. No distress.  HENT:  Head: Normocephalic.  Right Ear: External ear normal.  Left Ear: External ear normal.  Nose: Nose normal.  Mouth/Throat: Oropharynx is clear  and moist.  Eyes: Conjunctivae are normal. Pupils are equal, round, and reactive to light. Right eye exhibits no discharge. Left eye exhibits no discharge.  Neck: Normal range of motion. Neck supple. No JVD present. No tracheal deviation present. No thyromegaly present.  Cardiovascular: Normal rate, regular rhythm and normal heart sounds.   Pulmonary/Chest: No stridor. No respiratory distress. She has no wheezes.  Abdominal: Soft. Bowel sounds are normal. She exhibits no distension and no mass. There is tenderness. There is no rebound and no guarding.  Musculoskeletal: She  exhibits no edema or tenderness.  Lymphadenopathy:    She has no cervical adenopathy.  Neurological: She displays normal reflexes. No cranial nerve deficit. She exhibits normal muscle tone. Coordination normal.  Skin: No rash noted. No erythema.  Psychiatric: She has a normal mood and affect. Her behavior is normal. Judgment and thought content normal.    Lab Results  Component Value Date   WBC 4.0 07/08/2016   HGB 12.8 07/08/2016   HCT 37.9 07/08/2016   PLT 199.0 07/08/2016   GLUCOSE 85 07/02/2016   CHOL 174 05/11/2010   TRIG 78.0 05/11/2010   HDL 52.30 05/11/2010   LDLDIRECT 130.8 09/30/2006   LDLCALC 106 (H) 05/11/2010   ALT 13 (L) 06/30/2016   AST 22 06/30/2016   NA 136 07/02/2016   K 3.7 07/02/2016   CL 107 07/02/2016   CREATININE 0.93 07/02/2016   BUN 17 07/02/2016   CO2 22 07/02/2016   TSH 1.700 03/22/2016   INR 1.08 06/30/2016   HGBA1C 6.0 04/10/2012    Ct Abdomen Pelvis W Contrast  Result Date: 06/30/2016 CLINICAL DATA:  Rectal bleeding. Left lower quadrant abdominal pain. Symptoms became worse today. Personal history of anal fissures and diverticulitis. IBS. Personal history of breast cancer. EXAM: CT ABDOMEN AND PELVIS WITH CONTRAST TECHNIQUE: Multidetector CT imaging of the abdomen and pelvis was performed using the standard protocol following bolus administration of intravenous contrast. CONTRAST:  160mL ISOVUE-300 IOPAMIDOL (ISOVUE-300) INJECTION 61% COMPARISON:  CT of the chest, abdomen, and pelvis 02/11/2009. FINDINGS: Lower chest: Lung bases are clear without focal nodule, mass, or airspace disease. The heart is mildly enlarged. There is no edema or effusion. Pacing wires are in place. Hepatobiliary: No focal intrahepatic biliary dilation is present. Multiple gallstones are noted. There is no inflammatory change about the gallbladder. Pancreas: No significant inflammatory changes are present. There is mild dilation of the pancreatic duct. No obstructing mass lesion  is evident. Spleen: Normal in size without focal abnormality. Adrenals/Urinary Tract: Adrenal glands are normal bilaterally. A 2.5 cm cyst is present at the lower pole of the left kidney. The focally tear present. There are no stones. The urinary bladder is within normal limits. Stomach/Bowel: A small hiatal hernia is present. The stomach and duodenum are otherwise unremarkable. Small bowel is within normal limits. Appendix is visualized and normal. The ascending and transverse colon are within normal limits. Diverticular changes are present in the descending and sigmoid colon. There is focal wall thickening inflammatory change in the distal sigmoid colon. The rectum is unremarkable. No anal fissures are evident. Vascular/Lymphatic: Atherosclerotic calcifications are present in the aorta and branch vessels without aneurysm. No significant retroperitoneal or pelvic adenopathy is present. Reproductive: Hysterectomy is noted. The ovaries are not discretely visualized and may be surgically absent as well. Other: No significant free fluid or free air is present. Musculoskeletal: Degenerative endplate changes and facet arthropathy are present in the lower lumbar spine. No focal lytic or blastic lesions are  present. Vertebral body heights are maintained. Bony pelvis is intact. The hips are located. Degenerative changes are noted at the SI joints bilaterally. IMPRESSION: 1. Inflammatory changes of the sigmoid colon with diverticula compatible with acute diverticulitis. There is no abscess or free air. No complicating features are evident. 2. Additional diverticular present within the remaining sigmoid colon and descending colon without other inflammatory change. 3. Small hiatal hernia. 4. Mild atherosclerotic changes within the aorta and branch vessels. 5. Mild cardiomegaly without failure. Electronically Signed   By: San Morelle M.D.   On: 06/30/2016 20:22   Dg Abd Portable 1v  Result Date: 07/01/2016 CLINICAL  DATA:  Nausea and vomiting EXAM: PORTABLE ABDOMEN - 1 VIEW COMPARISON:  06/30/2016 FINDINGS: Scattered large and small bowel gas is seen. No obstructive changes are seen. No free air is noted. Mild degenerative change of the lumbar spine is noted. IMPRESSION: No acute abnormality seen. Electronically Signed   By: Inez Catalina M.D.   On: 07/01/2016 15:49    Assessment & Plan:   There are no diagnoses linked to this encounter. I am having Ms. Lemaire maintain her levothyroxine, fluocinonide cream, spironolactone, pindolol, ranitidine, cetirizine, psyllium, allopurinol, KLOR-CON M20, feeding supplement (ENSURE ENLIVE), LORazepam, escitalopram, and oxybutynin.  No orders of the defined types were placed in this encounter.    Follow-up: No Follow-up on file.  Walker Kehr, MD

## 2016-07-13 NOTE — Assessment & Plan Note (Signed)
Finished abx 

## 2016-07-13 NOTE — Assessment & Plan Note (Signed)
No angina 

## 2016-07-13 NOTE — Assessment & Plan Note (Signed)
Ginseng tea Gummy MVI

## 2016-07-13 NOTE — Progress Notes (Signed)
Pre-visit discussion using our clinic review tool. No additional management support is needed unless otherwise documented below in the visit note.  

## 2016-07-13 NOTE — Patient Instructions (Addendum)
Gummy chewable vitamins Tea with ginseng for energy Labs in 4 weeks

## 2016-07-13 NOTE — Assessment & Plan Note (Signed)
3/18 treated

## 2016-07-14 ENCOUNTER — Encounter: Payer: Self-pay | Admitting: *Deleted

## 2016-07-14 ENCOUNTER — Other Ambulatory Visit: Payer: Self-pay | Admitting: *Deleted

## 2016-07-14 NOTE — Patient Outreach (Signed)
[facility]) Care Management  07/14/2016  Cameren Odwyer 05-18-1929 [id number]   CSW was able to make initial contact with patient today to perform phone assessment, as well as assess and assist with social work needs and services.  CSW introduced self, explained role and types of services provided through [place] Management ([place] [facility]).  CSW further explained to patient that CSW works with patient's Telephonic RNCM, also with [place] Management, Quinn Plowman. CSW then explained the reason for the call, indicating that Mrs. Nyoka Cowden thought that patient would benefit from social work services and resources to assist with transportation to and from physician appointments.  CSW obtained two HIPAA compliant identifiers from patient, which included patient's name and date of birth. Patient admitted that she is a little nervous this morning, as she found a little blood in her stool.  Patient believes that it may be from an anal fissure, but is concerned, none-the-less.  Patient was encouraged to place a call into her Gastroenterologist, Dr. Richmond Campbell to report findings.  Patient voiced understanding and was agreeable to this plan.  Patient indicated that she is scheduled for a colonoscopy next month, so hopeful this will provide more insight into the bleeding that she witnesses while having bowel movements.  Patient reported, "I've lost several pounds recently because I don't always eat like I should". Patient indicated that her son, daughter and/or neighbor transport her to her physician appointments when she does not feel comfortable driving herself.  CSW explained to patient that I might be good to have a back-up plan, in the event that one of the above-named individuals are not available to transport her.  CSW agreed to mail patient a packet of resource information, including all of the following: SCAT Paramedic)  Psychiatric nurse for [employer] for [employer] through [employer] [facility] [place] agreed to follow-up with patient on Thursday of next week to ensure that patient received the packet of information that CSW will place in the mail to patient today, as well as to assist patient with completion of referrals and applications. Nat Christen, BSW, MSW, LCSW  Licensed Education officer, [facility]  Mailing [place] [address] [place], [place] [address] Physical Address-[address], [place], [place] [address] Toll Free Main # [phone] Fax # [phone] Cell # [phone]  Office # [phone] [email or web address]

## 2016-07-14 NOTE — Patient Outreach (Signed)
[facility]) Care Management  07/14/2016  [name] [birth year] [id number]   Mailed Welcome Packet, Consent for Treatment, SCAT application, Arts administrator for [employer], Programmer, systems for [employer] through [employer] [facility] [employer] per [employer], [place]

## 2016-07-19 ENCOUNTER — Encounter: Payer: Self-pay | Admitting: Cardiology

## 2016-07-19 NOTE — Progress Notes (Signed)
Cardiology Office Note    Date:  07/20/2016   ID:  [name], DOB [birth year], MRN [id number]  PCP:  Walker Kehr, MD  Cardiologist:  Fransico Him, MD   Chief Complaint  Patient presents with  . Sleep Apnea  . Hypertension    History of Present Illness:  [name] is a 83 y.o. female with a history of bradycardia and CHF who was referred for sleep study by Dr. Aundra Dubin.   He ordered it due to CHF and also a history of snoring. She underwent PSG showing mild OSA with an AHI of 12/hr with no significant oxygen desaturations but loud snoring was noted.  She underwent CPAP titration to 12cm H2O and is now here for followup.  She is doing well with her CPAP device.  She tolerates the nasal pillow mask and feels the pressure is adequate. She says that she has to get up multiple times nightly to use the bathroom. She takes oxybutinin but that makes her mouth dry and so she stopped taking it.   Since going on CPAP she feels more rested in the am unless she has to get up a lot to go to the bathroom.  She has not really noticed if she has less daytime sleepiness.  She does not nap during the day. She does not know if she is snoring.     Past Medical History:  Diagnosis Date  . Anxiety   . Bradycardia   . Breast cancer, left breast ([place]) 1995  . Breast cancer, right breast ([place]) 2002  . CHF (congestive heart failure) ([place])   . Depression    Dr Cheryln Manly  . Discoid lupus    skin  . Full dentures   . GERD (gastroesophageal reflux disease)   . Glaucoma   . Gout   . Hx of echocardiogram    a.  Echocardiogram (03/2011): Mild LVH, EF 15-05%, grade 1 diastolic dysfunction, mild MR, mild to moderate TR, PASP 42;  b. Echo (05/2013):  Mod LVH, EF 65-70%, no WMA, Gr 1 DD, mild MR, mod TR, PASP 39  . Hx of exercise stress test    a. ETT-echocardiogram (06/08/11): Sub-optimal exercise. Test stopped early due to dizziness and hypotension. EF 60%.  Non-diagnostic.;   b. Lexiscan Myoview (06/2013):   Diaphragmatic attenuation, no ischemia, EF 61%.  Low Risk  . Hypertension   . Hypothyroidism   . IBS (irritable bowel syndrome)   . IBS (irritable bowel syndrome)   . Incontinence   . OSA (obstructive sleep apnea) 07/20/2016   Mild with AHI 12/hr now on CPAP  . OSA on CPAP    "quit wearing mask; retested; not enough information; to be tested again 04/08/2016"  . Osteoarthritis   . Osteopenia   . Pacemaker 2009   Brady//Chronotropic incompetence with normal pacemaker function  . Thyroid disease   . Wears hearing aid    both ears    Past Surgical History:  Procedure Laterality Date  . BREAST BIOPSY Left 1995  . BREAST LUMPECTOMY Left 1995   axillary node dissectoin  . CARDIAC CATHETERIZATION N/A 02/13/2016   Procedure: Right Heart Cath;  Surgeon: Larey Dresser, MD;  Location: [facility];  Service: Cardiovascular;  Laterality: N/A;  . CATARACT EXTRACTION W/ INTRAOCULAR LENS  IMPLANT, BILATERAL Bilateral 06/2009 - 12.2915   left - right  . DORSAL COMPARTMENT RELEASE  2000   left  . HAMMER TOE SURGERY Left 2004  . HEMORRHOID BANDING    .  INSERT / REPLACE / REMOVE PACEMAKER  2009  . KNEE ARTHROSCOPY Left 08/29/2012   Procedure: LEFT KNEE ARTHROSCOPY ;  Surgeon: Hessie Dibble, MD;  Location: [facility];  Service: Orthopedics;  Laterality: Left;  [name] MASTECTOMY Bilateral 09/2000   nbx  . TRIGGER FINGER RELEASE Right 07/31/2013   Procedure: EXCISE MASS RIGHT INDEX A-1 PLLLEY RELEASE A-1 RIGHT INDEX;  Surgeon: Cammie Sickle., MD;  Location: [facility];  Service: Orthopedics;  Laterality: Right;  [name] VAGINAL HYSTERECTOMY      Current Medications: Current Meds  Medication Sig  . allopurinol (ZYLOPRIM) 300 MG tablet TAKE 1 TABLET BY MOUTH DAILY  . cetirizine (ZYRTEC) 10 MG tablet Take 10 mg by mouth daily as needed for allergies.  [name] escitalopram (LEXAPRO) 10 MG tablet TAKE 1 TABLET (10 MG) BY MOUTH DAILY AT BEDTIME.  . feeding supplement,  ENSURE ENLIVE, (ENSURE ENLIVE) LIQD Take 237 mLs by mouth 2 (two) times daily between meals.  . fluocinonide cream (LIDEX) 3.50 % Apply 1 application topically daily as needed (For spots on face/itching.).   [name] KLOR-CON M20 20 MEQ tablet TAKE 1 TABLET (20 MEQ TOTAL) BY MOUTH DAILY.  [name] levothyroxine (SYNTHROID, LEVOTHROID) 100 MCG tablet Take 1 tablet (100 mcg total) by mouth daily.  [name] LORazepam (ATIVAN) 0.5 MG tablet Take 1 tab daily and 2nd as needed  . oxybutynin (DITROPAN) 5 MG tablet Take 1 tablet (5 mg total) by mouth daily as needed.  . pindolol (VISKEN) 5 MG tablet TAKE 1 TABLET BY MOUTH DAILY  . psyllium (METAMUCIL SMOOTH TEXTURE) 28 % packet Take 1 packet by mouth daily with breakfast.  . ranitidine (ZANTAC) 150 MG tablet Take 150 mg by mouth 3 (three) times daily.  [name] spironolactone (ALDACTONE) 25 MG tablet Take 1 tablet (25 mg total) by mouth daily.    Allergies:   Lactase; Amlodipine besy-benazepril hcl; Amlodipine besylate; Clonidine hydrochloride; Lactose intolerance (gi); Valsartan; and Verapamil   Social History   Social History  . Marital status: Widowed    Spouse name: N/A  . Number of children: N/A  . Years of education: N/A   Occupational History  . retired    Social History Main Topics  . Smoking status: Never Smoker  . Smokeless tobacco: Never Used  . Alcohol use No  . Drug use: No  . Sexual activity: No   Other Topics Concern  . Not on file   Social History Narrative   She is a vegan now 2010     Family History:  The patient's family history includes Cancer in her brother; Heart disease in her brother and father; Ovarian cancer in her mother; Stroke in her brother and sister.   ROS:   Please see the history of present illness.    ROS All other systems reviewed and are negative.  No flowsheet data found.     PHYSICAL EXAM:   VS:  BP 138/80   Pulse 89   Ht 5\' 4"  (1.626 m)   Wt 166 lb 8 oz (75.5 kg)   SpO2 93%   BMI 28.58 kg/m    GEN: Well  nourished, well developed, in no acute distress  HEENT: normal  Neck: no JVD, carotid bruits, or masses Cardiac: RRR; no murmurs, rubs, or gallops,no edema.  Intact distal pulses bilaterally.  Respiratory:  clear to auscultation bilaterally, normal work of breathing GI: soft, nontender, nondistended, + BS MS: no deformity or atrophy  Skin: warm and dry, no  rash Neuro:  Alert and Oriented x 3, Strength and sensation are intact Psych: euthymic mood, full affect  Wt Readings from Last 3 Encounters:  07/20/16 166 lb 8 oz (75.5 kg)  07/13/16 166 lb (75.3 kg)  07/12/16 162 lb (73.5 kg)      Studies/Labs Reviewed:   EKG:  EKG is not ordered today.    Recent Labs: 03/22/2016: B Natriuretic Peptide 81.9; TSH 1.700 06/30/2016: ALT 13 07/02/2016: BUN 17; Creatinine, Ser 0.93; Potassium 3.7; Sodium 136 07/08/2016: Hemoglobin 12.8; Platelets 199.0   Lipid Panel    Component Value Date/Time   CHOL 174 05/11/2010 1229   TRIG 78.0 05/11/2010 1229   HDL 52.30 05/11/2010 1229   CHOLHDL 3 05/11/2010 1229   VLDL 15.6 05/11/2010 1229   LDLCALC 106 (H) 05/11/2010 1229   LDLDIRECT 130.8 09/30/2006 1047    Additional studies/ records that were reviewed today include:  CPAP download    ASSESSMENT:    1. OSA (obstructive sleep apnea)   2. Benign essential HTN      PLAN:  In order of problems listed above:  OSA - the patient is tolerating PAP therapy well without any problems. The PAP download was reviewed today and showed an AHI of 9.6/hr on 12 cm H2O with 57 % compliance in using more than 4 hours nightly.  The patient has been using and benefiting from CPAP use and will continue to benefit from therapy. I am concerned that she may be breathing through her mouth and that could be why her AHI is so high.  I am going to order a chin strap and get a 2 week autotitration from 4 to 18cm H2O.  I have encouraged her to be more compliant with her device.  HTN - BP controlled on current meds. She  will continue on Pindolol and spironolactone.     Medication Adjustments/Labs and Tests Ordered: Current medicines are reviewed at length with the patient today.  Concerns regarding medicines are outlined above.  Medication changes, Labs and Tests ordered today are listed in the Patient Instructions below.  There are no Patient Instructions on file for this visit.   Signed, Fransico Him, MD  07/20/2016 9:55 AM    [address] [place], [place],   [address] Phone: [phone]; Fax: [phone]

## 2016-07-20 ENCOUNTER — Other Ambulatory Visit: Payer: Self-pay

## 2016-07-20 ENCOUNTER — Encounter: Payer: Self-pay | Admitting: Cardiology

## 2016-07-20 ENCOUNTER — Ambulatory Visit (INDEPENDENT_AMBULATORY_CARE_PROVIDER_SITE_OTHER): Payer: Medicare Other | Admitting: Cardiology

## 2016-07-20 VITALS — BP 138/80 | HR 89 | Ht 64.0 in | Wt 166.5 lb

## 2016-07-20 DIAGNOSIS — I10 Essential (primary) hypertension: Secondary | ICD-10-CM | POA: Diagnosis not present

## 2016-07-20 DIAGNOSIS — G4733 Obstructive sleep apnea (adult) (pediatric): Secondary | ICD-10-CM

## 2016-07-20 DIAGNOSIS — I2510 Atherosclerotic heart disease of native coronary artery without angina pectoris: Secondary | ICD-10-CM | POA: Diagnosis not present

## 2016-07-20 DIAGNOSIS — G4730 Sleep apnea, unspecified: Secondary | ICD-10-CM | POA: Insufficient documentation

## 2016-07-20 HISTORY — DX: Obstructive sleep apnea (adult) (pediatric): G47.33

## 2016-07-20 NOTE — Patient Outreach (Signed)
[facility]) Care Management  07/20/2016  [name] 1929/11/29 [id number]  EMMI heart failure RED alert - Day # 14 REFERRAL DATE;  07/20/16  REFERRAL REASON;  Weight 162 lbs  Telephone call to patient regarding EMMI heart failure red alert. HIPAA verified with patient. RNCM informed patient of RED alert for her weight. Patient denies any symptoms of shortness of breath or swelling.  Patient states she was on a liquid diet while in the hospital and is now on a soft. Patient also reports that she did not take her "fluid pill" over the weekend. Patient states when she is busy on the weekends taking the fluid pill makes it difficult for her to hold her urine. Patient states her doctor put her oxybutin to help her with bladder control. Patient states she also gets up often at night to urinate.  She states that the oxybutin causes her to have dry mouth. Patient states she did not take the oxybutin over the weekend. Patient reports she only takes the oxybutin when she takes the fluid pill. RNCM advised patient to inform her doctor of the symptoms that she is having. RNCM informed patient that by not taking her fluid pill this causes her to retain fluid. Patient states she is 83 years old and has to sometimes do what is best for her.  RNCM reviewed heart failure signs/  symptoms with patient.  Advised patient to take her medication as prescribed and discuss the medication symptoms she is having with the doctor.  Patient voiced understanding and agreed to discuss with her doctor. Patient states her next follow up visit with her primary MD is on 08/10/16.  Patient states she is scheduled for a colonoscopy on 08/07/16.  Patient states she saw Dr. Radford Pax today who handles her sleep apnea.  Patient states she continues to use her CPAP machine daily. Patient states she is more rested.  Patient states she received call from [place] worker to discuss transportation assistance. Patient states she has  not received the transportation resource list in the mail.  RNCM advised patient to expect list in a few days.  Advised if she does not receive to call and inform RNCM.  Patient verbally agreed to next telephone follow up with RNCM.    PLAN: RNCM will follow up with patient at next scheduled outreach.  Quinn Plowman RN,BSN,CCM [facility] Telephonic  [phone]

## 2016-07-20 NOTE — Patient Instructions (Signed)
Medication Instructions:  Your physician recommends that you continue on your current medications as directed. Please refer to the Current Medication list given to you today.   Labwork: None  Testing/Procedures: None  Follow-Up: Your physician wants you to follow-up in: 6 months with Dr. Radford Pax. You will receive a reminder letter in the mail two months in advance. If you don't receive a letter, please call our office to schedule the follow-up appointment.   Any Other Special Instructions Will Be Listed Below (If Applicable). Please go to [place] and pick up your new chin strap. Dr. Radford Pax has ordered for you a 2 week auto-titration to be completed with the new strap.     If you need a refill on your cardiac medications before your next appointment, please call your pharmacy.

## 2016-07-20 NOTE — Patient Outreach (Signed)
[facility]) Care Management  07/20/2016  [name] May 14, 1929 [id number]   Patient triggered Red on EMMI Heart Failure Dashboard, notification sent to:  Quinn Plowman, RN

## 2016-07-20 NOTE — Patient Outreach (Signed)
[facility]) Care Management  07/20/2016  Harini Dearmond 26-Dec-1929 [id number]  EMMI heart failure - Day # 14 REFERRAL DATE: 07/20/16 REFERRAL REASON: Weight 162 lbs  Telephone call to patient regarding EMMI heart failure red alert. Unable to reach patient on listed home and mobile number. HIPAA compliant voice message left with call back phone number.   PLAN; RNCM will attempt 2nd telephone outreach to patient within  1 week.   Quinn Plowman RN,BSN,CCM [facility] Telephonic  [phone]

## 2016-07-21 ENCOUNTER — Ambulatory Visit: Payer: Self-pay

## 2016-07-22 ENCOUNTER — Other Ambulatory Visit: Payer: Self-pay | Admitting: *Deleted

## 2016-07-22 ENCOUNTER — Telehealth: Payer: Self-pay | Admitting: Internal Medicine

## 2016-07-22 ENCOUNTER — Other Ambulatory Visit: Payer: Self-pay

## 2016-07-22 NOTE — Patient Outreach (Signed)
[facility]) Care Management  07/22/2016  [name] 09/16/1929 [id number]   CSW was able to make contact with patient today to follow-up regarding social work services and resources, as well as to ensure that patient received the packet of resource information mailed to her home.  Patient admitted to receiving the resource information but not needing transportation services at this time, as she reports that her family is always available to transport her when she is unable to drive herself.  Patient was very appreciative of the resources but indicated that no additional social work needs have been identified at this time. CSW will perform a case closure on patient, as all goals of treatment have been met from social work standpoint and no additional social work needs have been identified at this time.  CSW will notify patient's RNCM with [facility], Quinn Plowman of CSW's plans to close patient's case.  CSW will fax an update to patient's Primary Care Physician, Dr. Tyrone Apple Plotnikov to ensure that they are aware of CSW's involvement with patient's plan of care.  CSW will submit a case closure request to Verlon Setting, Care Management Assistant with [facility], in the form of an [employer].  CSW will ensure that [name] is aware of [name]'s, RNCM with [facility], continued involvement with patient's care. Nat Christen, BSW, MSW, LCSW  Licensed Education officer, [facility]  Mailing [place] [address] [place], [place] [address] Physical Address-[address], [place], [place] [address] Toll Free Main # [phone] Fax # [phone] Cell # [phone]  Office # [phone] [email or web address]

## 2016-07-22 NOTE — Telephone Encounter (Signed)
Routing to dr plotnikov, please advise, thanks 

## 2016-07-22 NOTE — Patient Outreach (Signed)
[facility]) Care Management  07/22/2016  [name] 08-Jul-1929 [id number]  EMMI heart failure red alert: Day #16 REFERRAL DATE: 07/22/16 REFERRAL REASON: Questions about medications  Telephone call to patient regarding EMMI heart failure red alert. Unable to reach patient at this time. HIPAA compliant voice message left with call back phone number.   PLAN:  RNCM will attempt 2nd telephone follow up with patient within 1 week.  Quinn Plowman RN,BSN,CCM [facility] Telephonic  [phone]

## 2016-07-22 NOTE — Patient Outreach (Signed)
[facility]) Care Management  07/22/2016  [name] 17-Jan-1930 [id number]   EMMI heart failure red alert: Day #16 REFERRAL DATE: 07/22/16 REFERRAL REASON: Questions about medications  Received return call from patient. HIPAA verified with patient. Patient states she saw her allergist, Dr. Fredderick Phenix recently due to her allergies. Patient states she needs to start taking her allergy shots again but is unable to due to the blood pressure medication, pindolol she is on. Patient states her allergist requested she have her cardiologist prescribe her another blood pressure medicine. Patient states she asked her cardiologist but was told that they did not authorize the original blood pressure medicine therefore could not change the prescription. Patient states her primary MD authorized her blood pressure medicine. Patient states she has asked her primary MD for an alternative blood pressure medication in the past for her allergy shots but was not recommended an alternative.  RNCM contacted Dr.Plotnikov's office and left message with Aldona Bar requesting alternative blood pressure medication so that patient can take her allergy shots.  RNCM will refer patient to [facility] pharmacist to discuss her medications concerns.  Quinn Plowman RN,BSN,CCM [facility] Telephonic  [phone]

## 2016-07-22 NOTE — Telephone Encounter (Signed)
Pt needs to start taking allergie shots but will need an alternative BP pill other than pindolol (VISKEN). Please advise.

## 2016-07-23 ENCOUNTER — Telehealth: Payer: Self-pay | Admitting: *Deleted

## 2016-07-23 DIAGNOSIS — G4733 Obstructive sleep apnea (adult) (pediatric): Secondary | ICD-10-CM

## 2016-07-23 NOTE — Telephone Encounter (Signed)
Called the patient to follow up on whether she got her chin strap. She got the chin strap and wore it for the first time last night. I have ordered her 2 week titration and notified [facility].

## 2016-07-23 NOTE — Telephone Encounter (Signed)
-----   Message from Theodoro Parma, RN sent at 07/20/2016  1:01 PM EDT ----- Regarding: dme order The patient #should# have stopped by and gotten her chin strap today (I spoke with Apolonio Schneiders on the phone during clinic).   We need to get a 2 week auto-titration after.  Thank you!!

## 2016-07-25 MED ORDER — CARVEDILOL 3.125 MG PO TABS: 3.1250 mg | ORAL_TABLET | Freq: Two times a day (BID) | ORAL | 11 refills | Status: DC

## 2016-07-25 NOTE — Telephone Encounter (Signed)
OK Coreg Thx

## 2016-07-26 ENCOUNTER — Other Ambulatory Visit: Payer: Self-pay | Admitting: Pharmacist

## 2016-07-26 NOTE — Patient Outreach (Addendum)
[name] is referred to pharmacy for medication management. Per referral from [place], "patient is currently seeing Dr. Fredderick Phenix, allergist due to severe seasonal allergies.  Patient is unable to receive her allergy shots due to the blood pressure medication pindolol she is taking. Patient's allergist has asked her to follow up with her doctor for an alternative blood pressure medication so that she can continue to take her allergy shots."   Note that use of beta blockers, such as pindolol, are of concern in patients receiving allergy shots as they may diminish the patient's response to anti-anaphylactic treatments if these are needed to treat an anaphylactic reaction to the allergy therapy. Note that RNCM has contacted patient's PCP, Dr. Alain Marion regarding a change to the patient's pindolol therapy and that Dr. Alain Marion has changed the patient's pindolol to carvedilol. However, carvedilol is also a beta blocker and, therefore, has the same interaction with the patient's allergy therapy. Call to follow up with Ms. Donzetta Matters. Called and spoke with patient. HIPAA identifiers verified and verbal consent received.  Let Ms. Canlas know the reason for my call. Ms. Mcanany asks if I can follow up with her Cardiologist, Dr. Aundra Dubin, about this need for a change to her medication as he manages her heart failure.   PLAN:  1) As requested, will follow up with patient's Cardiologist, Dr. Aundra Dubin regarding the potential interaction between her allergy shots and beta blockers.  2) Will also follow up with patient's PCP.  Harlow Asa, PharmD, [facility] [phone]

## 2016-07-26 NOTE — Patient Outreach (Addendum)
Call to follow up with patient's Cardiologist, Dr. Aundra Dubin, regarding the potential interaction between the patient's allergy shots and beta blockers.  Speak with Nira Conn in the office. Let Nira Conn know that, per patient, she is currently seeing Dr. Fredderick Phenix, allergist, due to severe seasonal allergies and that she is currently unable to receive her allergy shots due to an interaction with her pindolol. Note that use of beta blockers, such as pindolol, are of concern in patients receiving allergy shots as they may diminish the patient's response to anti-anaphylactic treatments if these are needed to treat an anaphylactic reaction to the allergy therapy. Let Nira Conn know that our RNCM contacted patient's PCP, Dr. Alain Marion regarding a change to the patient's pindolol therapy and that Dr. Alain Marion has changed the patient's pindolol to carvedilol, but that, as it is also a beta-blocker, this has the same interaction.   Nira Conn lets me know that Dr. Aundra Dubin is out of the office this week, but that she will follow up with Dr. Aundra Dubin when he returns about potential use of another class of medication in place of the beta blocker and then follow up with Ms. Donzetta Matters.  Harlow Asa, PharmD, [facility] [phone]

## 2016-07-27 ENCOUNTER — Other Ambulatory Visit: Payer: Self-pay

## 2016-07-27 NOTE — Patient Outreach (Signed)
[facility]) Care Management  07/27/2016  [name] 27-Nov-1929 [id number]   EMMI Heart failure: Day #21 and telephone assessment follow up. REFERRAL DATE; 07/27/16  REFERRAL SOURCE: EMMI heart failure RED ALERT / and telephone assessment follow up.  REFERRAL REASON: Weighed themselves today; NO,  New worsening problems YES, New swelling: YES  Telephone call to patient regarding EMMI heart failure red alert.  HIPAA verified with patient. Patient states she is not having any new swelling. Patient reports she has been weighing herself.  Patient reports she has been having some anal bleeding for the last 2-3 days.  Patient states she has anal fissures that may bleed.  Patient denies calling and reporting symptoms to her doctor. Patient states she noticed the bleeding was a little more today. Patient reports the color being bright red.  Patient states she has not been constipated.  States she continues to have 2-3 bowel movements per day.  Patient reports she is only seeing the bleeding when she wipes herself. Patient denies any blood in her stool.   Patient states she continues to adhere to a soft diet as advised.  RNCM advised patient to call Dr. Liliane Channel office immediately to report new symptoms.  Patient verbally agreed.   PLAN; RNCM will follow up with patient within 1 business day. Patient will call gastroenterologist office to report new onset of symptom of bleeding.   Quinn Plowman RN,BSN,CCM [facility] Telephonic  [phone]

## 2016-07-27 NOTE — Progress Notes (Signed)
This encounter was created in error - please disregard.

## 2016-07-27 NOTE — Addendum Note (Signed)
Addended by: Virgel Manifold on: 07/27/2016 10:13 AM   Modules accepted: Level of Service, SmartSet

## 2016-07-28 ENCOUNTER — Other Ambulatory Visit: Payer: Self-pay

## 2016-07-28 NOTE — Patient Outreach (Signed)
[facility] [facility]) Care Management  07/28/2016  Talulah Schirmer 21-Nov-1929 [id number]  Telephone call to patient for follow up. HIPAA verified with patient. Patient states she spoke with her doctors office on yesterday. Patient states her doctor advised her to increase the over the counter cream she is using, Calmoseptine to 4 times per day and follow up with office in 1 week. Patient states she has had two bowel movements today. Denies any bleeding.  Patient reports she did a lot of walking on yesterday due to running errands. Patient stas she noticed her legs were slightly swollen last night before bed. Patient states her weight this morning was 163 lbs. Patient states she took all of her medications along with fluid pill.  Patient reports slight shortness of breath this morning. RNCM advised patient to call and notify her doctor of symptoms. Patient advised to call 911 for worsening heart failure symptoms.  Patient states she will re- weigh herself after her fluid pill has had time to work. Patient states she is aware to call if symptoms worsen. Patient reports she is having trouble sleeping with her CPAP machine. States she has to wear a chin strap as well as the mask at night. Patient states it causes her face to itch. Patient states she also has allergies so her nose runs a lot at night. RNCM advised nurse to contact medical company providing machine to have facial gear changed. Patient states she called [facility] and has an appointment scheduled for next week to take in equipment and request different face wear.  Patient verbally agreed to [facility] follow up.  PLAN; RNCM will follow up with patient within 2 weeks. Patient will report outcome of CPAP equipment appointment  Quinn Plowman RN,BSN,CCM [facility] Telephonic  [phone]

## 2016-07-29 ENCOUNTER — Ambulatory Visit: Payer: Self-pay

## 2016-07-29 ENCOUNTER — Other Ambulatory Visit: Payer: Self-pay

## 2016-07-29 NOTE — Patient Outreach (Signed)
[facility]) Care Management  07/29/2016  [name] [birth year] [id number]     EMMI- HF RED ON EMMI ALERT Day # 23 Date:07/28/16 Red Alert Reason: "Weight: 163 lbs"   "New/worsening problems? Yes"     Outreach attempt #1 to patient. Spoke with patient. She states she is still lying down in the bed. She reports she 'just feels so tired." Patient reports that she does not sleep well with CPAP and will be glad when that issues has been fixed and resolved and that's why she responded with feeling worse as she was tired.  However, patient states she can tell that she is already feeling better today than she was yesterday. She has not weighed herself yet this morning. She feels that swelling and breathing is better today as well.  Reviewed with patient s/s of worsening condition and when to alert MD. She voiced understanding. Patient voices that she is adhering to med regimen. She denies any further RN CM needs or concerns. Patient aware that she will continue to get automated EMMI HF calls and will receive a call from a nurse if any of her responses are abnormal. Patient voiced understanding and was appreciative of f/u call.     Plan: Assigned RN CM will follow up with patient as needed within the next month.    Enzo Montgomery, RN,BSN,CCM [facility] Telephonic Care Management Coordinator Direct Phone: [phone] Toll Free: [phone] Fax: [phone]

## 2016-07-30 ENCOUNTER — Other Ambulatory Visit: Payer: Self-pay

## 2016-07-30 ENCOUNTER — Other Ambulatory Visit (HOSPITAL_COMMUNITY): Payer: Self-pay | Admitting: Cardiology

## 2016-07-30 NOTE — Patient Outreach (Signed)
[facility]) Care Management  07/30/2016  Danelly Hassinger [birth year] [id number]   EMMI heart failure EMMI heart failure red alert Day # 24 REFERRAL DATE 07/30/16 REFERRAL REASON; Weighed themselves: no  Telephone outreach to patient to address EMMI heart failure red alert. HIPAA verified with patient. Patient states she has been weighing herself daily. Patient reports today's weight is 163 lbs. Patient states she is just tired. States she does not have a lot of energy. Patient states she has been resting today because she had a busy day on yesterday. Patient denies any swelling today. Patient states she still gets short of breath sometimes with a lot of activity. Patient denies any new symptoms. RNCM advised patient to notify her doctor for swelling, shortness of breath or increase weight gain  3 lbs overnight or 5 lbs in a week. Patient verbalized understanding.   PLAN; RNCM will follow up with patient at next scheduled appointment   Quinn Plowman RN,BSN,CCM [facility] Telephonic  [phone]

## 2016-07-30 NOTE — Patient Outreach (Signed)
Patient triggered Red on Emmi Heart Failure dashboard, notification sent to:  Quinn Plowman, RN

## 2016-08-02 ENCOUNTER — Ambulatory Visit (INDEPENDENT_AMBULATORY_CARE_PROVIDER_SITE_OTHER): Payer: Medicare Other | Admitting: Psychology

## 2016-08-02 DIAGNOSIS — F332 Major depressive disorder, recurrent severe without psychotic features: Secondary | ICD-10-CM | POA: Diagnosis not present

## 2016-08-04 ENCOUNTER — Telehealth: Payer: Self-pay | Admitting: Internal Medicine

## 2016-08-04 NOTE — Telephone Encounter (Signed)
Patient is calling and states that her allergist Dr. Harold Hedge told her that she cannot get her allergy shots while she is taking pindolol. It looks like the patient's PCP manages her BP meds. The pindolol was d/c'd on 4/1 by the patient's PCP and carvedilol 3.125 mg BID was ordered instead. When I told the patient this information she states that she was told by one of the nurses from [facility] that her PCP ordered the wrong medication and instructed her not to pick it up. Patient was advised to speak to her PCP about this since they manage her BP meds. Patient states that she has already spoken to them and that she wants to let Dr. Caryl [name] know. She states that she really needs to get her allergy shots and she cannot do so until she stops taking the pindolol. I advised the patient again that her PCP already switched her to carvedilol. Spoke with [facility] and she did not know of any interactions of beta blockers and allergy shots. I advised the patient to have Dr. Alyson [name] office contact her PCP's office to give guidance on what medications she can take while taking the allergy shots. Patient verbalized understanding.

## 2016-08-04 NOTE — Telephone Encounter (Signed)
Left message for patient to call back.  It looks like patient's BP meds are managed by her PCP.

## 2016-08-04 NOTE — Telephone Encounter (Signed)
Patient calling, states that she would like an alternative for her current blood pressure medication (pindolol). Thanks.

## 2016-08-05 ENCOUNTER — Telehealth (HOSPITAL_COMMUNITY): Payer: Self-pay | Admitting: *Deleted

## 2016-08-05 ENCOUNTER — Encounter (HOSPITAL_COMMUNITY): Payer: Self-pay | Admitting: Psychiatry

## 2016-08-05 ENCOUNTER — Ambulatory Visit (INDEPENDENT_AMBULATORY_CARE_PROVIDER_SITE_OTHER): Payer: Medicare Other | Admitting: Psychiatry

## 2016-08-05 DIAGNOSIS — Z79899 Other long term (current) drug therapy: Secondary | ICD-10-CM | POA: Diagnosis not present

## 2016-08-05 DIAGNOSIS — I2510 Atherosclerotic heart disease of native coronary artery without angina pectoris: Secondary | ICD-10-CM | POA: Diagnosis not present

## 2016-08-05 DIAGNOSIS — F330 Major depressive disorder, recurrent, mild: Secondary | ICD-10-CM

## 2016-08-05 MED ORDER — ESCITALOPRAM OXALATE 10 MG PO TABS: ORAL_TABLET | ORAL | 0 refills | Status: DC

## 2016-08-05 NOTE — Telephone Encounter (Signed)
Pt needs allergy shots, but MD states she can not be on beta blockers while getting the shots.  Discussed w/Dr Aundra Dubin, he states pt should just stop beta blocker for now, if BP starts increasing she can call back for another class of medications.  Pt is aware and agreeable w/plan.

## 2016-08-05 NOTE — Progress Notes (Signed)
BH MD/PA/NP OP Progress Note  08/05/2016 2:12 PM [name]  MRN:  [id number]  Chief Complaint:  Subjective:  I was admitted to the hospital because of passing out and rectal bleeding.  I still feel tired.  HPI: [name] came for her follow-up appointment.  In recent months she was hospitalized 2 times because of health issues.  Her first hospitalization was due to passing out and then she was admitted due to rectal bleeding.  She is concerned about her chronic health issues and feels some time nervous and anxious about the future.  She is feeling tired.  She is not using CPAP machine because she has postnasal drip due to allergies and she is looking for allergist who can prescribe injection which she took few years ago with good response.  She is seeing Dr. Cheryln Manly for therapy.  She denies any crying spells, anhedonia, hopelessness or worthlessness at get some time tired and feeling sad.  She denies any suicidal thoughts.  Her energy level is low.  She is taking Lexapro and reported no side effects.  She lives by herself however her daughter lives in [place] and she does communicate with her when she needed.  Her appetite is fair.  Her vital signs are stable.  Patient denies drinking alcohol or using any illegal substances.  Visit Diagnosis:    ICD-9-CM ICD-10-CM   1. Major depressive disorder, recurrent episode, mild (HCC) 296.31 F33.0 escitalopram (LEXAPRO) 10 MG tablet    Past Psychiatric History: Reviewed. Patient has one history of psychiatric inpatient treatment many years ago when she was admitted due to suicidal thinking.  Patient denies any history of suicidal attempt or any psychosis  Past Medical History:  Past Medical History:  Diagnosis Date  . Anxiety   . Bradycardia   . Breast cancer, left breast ([place]) 1995  . Breast cancer, right breast ([place]) 2002  . CHF (congestive heart failure) ([place])   . Depression    Dr Cheryln Manly  . Discoid lupus    skin  . Full dentures   .  GERD (gastroesophageal reflux disease)   . Glaucoma   . Gout   . Hx of echocardiogram    a.  Echocardiogram (03/2011): Mild LVH, EF 24-23%, grade 1 diastolic dysfunction, mild MR, mild to moderate TR, PASP 42;  b. Echo (05/2013):  Mod LVH, EF 65-70%, no WMA, Gr 1 DD, mild MR, mod TR, PASP 39  . Hx of exercise stress test    a. ETT-echocardiogram (06/08/11): Sub-optimal exercise. Test stopped early due to dizziness and hypotension. EF 60%.  Non-diagnostic.;   b. Lexiscan Myoview (06/2013):  Diaphragmatic attenuation, no ischemia, EF 61%.  Low Risk  . Hypertension   . Hypothyroidism   . IBS (irritable bowel syndrome)   . IBS (irritable bowel syndrome)   . Incontinence   . OSA (obstructive sleep apnea) 07/20/2016   Mild with AHI 12/hr now on CPAP  . OSA on CPAP    "quit wearing mask; retested; not enough information; to be tested again 04/08/2016"  . Osteoarthritis   . Osteopenia   . Pacemaker 2009   Brady//Chronotropic incompetence with normal pacemaker function  . Thyroid disease   . Wears hearing aid    both ears    Past Surgical History:  Procedure Laterality Date  . BREAST BIOPSY Left 1995  . BREAST LUMPECTOMY Left 1995   axillary node dissectoin  . CARDIAC CATHETERIZATION N/A 02/13/2016   Procedure: Right Heart Cath;  Surgeon: Larey Dresser, MD;  Location: [facility];  Service: Cardiovascular;  Laterality: N/A;  . CATARACT EXTRACTION W/ INTRAOCULAR LENS  IMPLANT, BILATERAL Bilateral 06/2009 - 12.2915   left - right  . DORSAL COMPARTMENT RELEASE  2000   left  . HAMMER TOE SURGERY Left 2004  . HEMORRHOID BANDING    . INSERT / REPLACE / REMOVE PACEMAKER  2009  . KNEE ARTHROSCOPY Left 08/29/2012   Procedure: LEFT KNEE ARTHROSCOPY ;  Surgeon: Hessie Dibble, MD;  Location: [facility];  Service: Orthopedics;  Laterality: Left;  [name] MASTECTOMY Bilateral 09/2000   nbx  . TRIGGER FINGER RELEASE Right 07/31/2013   Procedure: EXCISE MASS RIGHT INDEX A-1 PLLLEY  RELEASE A-1 RIGHT INDEX;  Surgeon: Cammie Sickle., MD;  Location: [facility];  Service: Orthopedics;  Laterality: Right;  [name] VAGINAL HYSTERECTOMY      Family Psychiatric History: Reviewed.  Family History:  Family History  Problem Relation Age of Onset  . Ovarian cancer Mother   . Heart disease Father   . Stroke Sister   . Stroke Brother   . Cancer Brother   . Heart disease Brother     Social History:  Social History   Social History  . Marital status: Widowed    Spouse name: N/A  . Number of children: N/A  . Years of education: N/A   Occupational History  . retired    Social History Main Topics  . Smoking status: Never Smoker  . Smokeless tobacco: Never Used  . Alcohol use No  . Drug use: No  . Sexual activity: No   Other Topics Concern  . Not on file   Social History Narrative   She is a vegan now 2010    Allergies:  Allergies  Allergen Reactions  . Lactase Diarrhea  . Amlodipine Besy-Benazepril Hcl Swelling  . Amlodipine Besylate Swelling  . Clonidine Hydrochloride     REACTION: tired  . Lactose Intolerance (Gi) Other (See Comments)  . Valsartan Other (See Comments)    Tongue swelling   . Verapamil     Metabolic Disorder Labs: Lab Results  Component Value Date   HGBA1C 6.0 04/10/2012   No results found for: PROLACTIN Lab Results  Component Value Date   CHOL 174 05/11/2010   TRIG 78.0 05/11/2010   HDL 52.30 05/11/2010   CHOLHDL 3 05/11/2010   VLDL 15.6 05/11/2010   LDLCALC 106 (H) 05/11/2010   LDLCALC 105 (H) 10/14/2009     Current Medications: Current Outpatient Prescriptions  Medication Sig Dispense Refill  . allopurinol (ZYLOPRIM) 300 MG tablet TAKE 1 TABLET BY MOUTH DAILY 30 tablet 1  . cetirizine (ZYRTEC) 10 MG tablet Take 10 mg by mouth daily as needed for allergies.    [name] escitalopram (LEXAPRO) 10 MG tablet TAKE 1 TABLET (10 MG) BY MOUTH DAILY AT BEDTIME. 90 tablet 0  . feeding supplement, ENSURE ENLIVE,  (ENSURE ENLIVE) LIQD Take 237 mLs by mouth 2 (two) times daily between meals. 237 mL 12  . fluocinonide cream (LIDEX) 1.88 % Apply 1 application topically daily as needed (For spots on face/itching.).     [name] KLOR-CON M20 20 MEQ tablet TAKE 1 TABLET (20 MEQ TOTAL) BY MOUTH DAILY. 30 tablet 3  . levothyroxine (SYNTHROID, LEVOTHROID) 100 MCG tablet Take 1 tablet (100 mcg total) by mouth daily. 90 tablet 3  . LORazepam (ATIVAN) 0.5 MG tablet Take 1 tab daily and 2nd as needed 45 tablet 1  . oxybutynin (DITROPAN) 5  MG tablet Take 1 tablet (5 mg total) by mouth daily as needed. 30 tablet 3  . psyllium (METAMUCIL SMOOTH TEXTURE) 28 % packet Take 1 packet by mouth daily with breakfast.    . ranitidine (ZANTAC) 150 MG tablet Take 150 mg by mouth 3 (three) times daily.    [name] spironolactone (ALDACTONE) 25 MG tablet Take 1 tablet (25 mg total) by mouth daily. 90 tablet 1   No current facility-administered medications for this visit.     Neurologic: Headache: No Seizure: No Paresthesias: No  Musculoskeletal: Strength & Muscle Tone: within normal limits Gait & Station: normal Patient leans: N/A  Psychiatric Specialty Exam: Review of Systems  Constitutional: Positive for malaise/fatigue.  Respiratory: Positive for shortness of breath.   Gastrointestinal: Negative for nausea.  Psychiatric/Behavioral: The patient has insomnia.     Blood pressure 124/74, pulse 81, height 5\' 4"  (1.626 m), weight 167 lb (75.8 kg).There is no height or weight on file to calculate BMI.  General Appearance: Casual  Eye Contact:  Good  Speech:  Clear and Coherent  Volume:  Normal  Mood:  Anxious and Dysphoric  Affect:  Congruent  Thought Process:  Goal Directed  Orientation:  Full (Time, Place, and Person)  Thought Content: Rumination   Suicidal Thoughts:  No  Homicidal Thoughts:  No  Memory:  Immediate;   Good Recent;   Good Remote;   Good  Judgement:  Good  Insight:  Good  Psychomotor Activity:  Normal   Concentration:  Concentration: Fair and Attention Span: Fair  Recall:  Good  Fund of Knowledge: Good  Language: Good  Akathisia:  No  Handed:  Right  AIMS (if indicated):  0  Assets:  Communication Skills Desire for Improvement Housing  ADL's:  Intact  Cognition: WNL  Sleep:     Assessment: Major depressive disorder, recurrent.  Plan: Reassurance given.  Recommended to continue Lexapro 10 mg daily.  Patient is resistant to take higher dose.  She is seeing Dr. Cheryln Manly for counseling.  She has cut down her lorazepam because of fear of addiction and taking only as needed.  She has still old prescription at pharmacy.  She does not need a new one.  Recommended to call us back if she has any question, concern or if she feels worsening of the symptom.  Follow-up in 3 months.   Kimbrely Buckel T., MD 08/05/2016, 2:12 PM

## 2016-08-06 ENCOUNTER — Other Ambulatory Visit (INDEPENDENT_AMBULATORY_CARE_PROVIDER_SITE_OTHER): Payer: Medicare Other

## 2016-08-06 ENCOUNTER — Telehealth: Payer: Self-pay | Admitting: Cardiology

## 2016-08-06 ENCOUNTER — Other Ambulatory Visit: Payer: Self-pay | Admitting: Pharmacist

## 2016-08-06 DIAGNOSIS — D500 Iron deficiency anemia secondary to blood loss (chronic): Secondary | ICD-10-CM | POA: Diagnosis not present

## 2016-08-06 DIAGNOSIS — I2510 Atherosclerotic heart disease of native coronary artery without angina pectoris: Secondary | ICD-10-CM | POA: Diagnosis not present

## 2016-08-06 DIAGNOSIS — K5792 Diverticulitis of intestine, part unspecified, without perforation or abscess without bleeding: Secondary | ICD-10-CM | POA: Diagnosis not present

## 2016-08-06 LAB — CBC WITH DIFFERENTIAL/PLATELET
BASOS ABS: 0 10*3/uL (ref 0.0–0.1)
Basophils Relative: 0.9 % (ref 0.0–3.0)
EOS ABS: 0.2 10*3/uL (ref 0.0–0.7)
Eosinophils Relative: 4.4 % (ref 0.0–5.0)
HCT: 39.4 % (ref 36.0–46.0)
HEMOGLOBIN: 13 g/dL (ref 12.0–15.0)
LYMPHS ABS: 1.3 10*3/uL (ref 0.7–4.0)
Lymphocytes Relative: 27.6 % (ref 12.0–46.0)
MCHC: 33 g/dL (ref 30.0–36.0)
MCV: 100.6 fl — ABNORMAL HIGH (ref 78.0–100.0)
MONO ABS: 0.4 10*3/uL (ref 0.1–1.0)
Monocytes Relative: 8.7 % (ref 3.0–12.0)
NEUTROS PCT: 58.4 % (ref 43.0–77.0)
Neutro Abs: 2.7 10*3/uL (ref 1.4–7.7)
Platelets: 197 10*3/uL (ref 150.0–400.0)
RBC: 3.91 Mil/uL (ref 3.87–5.11)
RDW: 14.2 % (ref 11.5–15.5)
WBC: 4.6 10*3/uL (ref 4.0–10.5)

## 2016-08-06 LAB — BASIC METABOLIC PANEL
BUN: 24 mg/dL — ABNORMAL HIGH (ref 6–23)
CO2: 31 meq/L (ref 19–32)
Calcium: 9.5 mg/dL (ref 8.4–10.5)
Chloride: 102 mEq/L (ref 96–112)
Creatinine, Ser: 1.09 mg/dL (ref 0.40–1.20)
GFR: 61.42 mL/min (ref >60.00)
GLUCOSE: 101 mg/dL — AB (ref 70–99)
POTASSIUM: 4.5 meq/L (ref 3.5–5.1)
SODIUM: 137 meq/L (ref 135–145)

## 2016-08-06 LAB — HEPATIC FUNCTION PANEL
ALT: 11 U/L (ref 0–35)
AST: 17 U/L (ref 0–37)
Albumin: 3.9 g/dL (ref 3.5–5.2)
Alkaline Phosphatase: 65 U/L (ref 39–117)
Bilirubin, Direct: 0.2 mg/dL (ref 0.0–0.3)
TOTAL PROTEIN: 7.3 g/dL (ref 6.0–8.3)
Total Bilirubin: 0.8 mg/dL (ref 0.2–1.2)

## 2016-08-06 NOTE — Telephone Encounter (Signed)
Called the patient back to follow up on her call. Patient states she did not use her cpap machine last night because her allergies have been really bad. She says she has to keep taking the mask on and off through the night to wipe and blow her nose. She says she will wear her cpap as much as she can so she can still try to meet compliance.

## 2016-08-06 NOTE — Patient Outreach (Signed)
Receive a voicemail message from [place] in Dr. Claris Gladden office. [name] reports that per Dr. Aundra Dubin, patient should stop her beta blocker for now, if BP starts increasing she can call back for another class of medication. Note that patient's beta blocker has been discontinued from her medication list in EPIC.  Called and spoke with patient. HIPAA identifiers verified and verbal consent received. [name] confirms that she spoke with [facility] yesterday and is excited that she will be able to start her allergy shots again. Reports that she stopped taking the pindolol yesterday and that she had her blood pressure checked at an appointment yesterday. Note that per EPIC, her blood pressure at this appointment was 124/74 with a pulse of 81. Patient reports that she is going to start checking her blood pressure daily and will keep a log.   Reports that she has called and left a message with her allergist, Dr. Alyson Reedy, office requesting an appointment to start the allergy shots again.  Reports that she has been having terrible difficulty with using her CPAP machine because of the allergy symptoms. Reports that she was unable to use it last night. Patient reports that she is going to follow up with Dr. Theodosia Blender office, who she sees related to her CPAP machine.  [name] reports that she has a follow up appointment with her PCP on Monday. States that she will let him know that she has discontinued her beta blocker per Dr. Claris Gladden instruction, to allow her to restart her allergy shots.  [name] denies any further medication questions/concerns at this time. Patient confirms that she has my phone number.  Will close pharmacy episode at this time.  Harlow Asa, PharmD, [facility] [phone]

## 2016-08-06 NOTE — Telephone Encounter (Signed)
New message      FYI Pt did not use her CPAP machine last night because her allergies are bad causing her nose to run all of the time.  She may have to restart allergy shots.  Until then, she will use the machine whenever she can.

## 2016-08-09 ENCOUNTER — Ambulatory Visit (INDEPENDENT_AMBULATORY_CARE_PROVIDER_SITE_OTHER): Payer: Medicare Other | Admitting: Internal Medicine

## 2016-08-09 ENCOUNTER — Encounter: Payer: Self-pay | Admitting: Internal Medicine

## 2016-08-09 ENCOUNTER — Other Ambulatory Visit: Payer: Self-pay

## 2016-08-09 DIAGNOSIS — K602 Anal fissure, unspecified: Secondary | ICD-10-CM

## 2016-08-09 DIAGNOSIS — I10 Essential (primary) hypertension: Secondary | ICD-10-CM | POA: Diagnosis not present

## 2016-08-09 DIAGNOSIS — I2510 Atherosclerotic heart disease of native coronary artery without angina pectoris: Secondary | ICD-10-CM

## 2016-08-09 DIAGNOSIS — E032 Hypothyroidism due to medicaments and other exogenous substances: Secondary | ICD-10-CM | POA: Diagnosis not present

## 2016-08-09 NOTE — Assessment & Plan Note (Signed)
On levothroid 

## 2016-08-09 NOTE — Patient Outreach (Signed)
[facility]) Care Management  08/09/2016  [name] June 02, 1929 [id number]  Care coordination update:  Received update from [facility] care management pharmacist, Tommy Rainwater.  Per Benjamine Mola patient is to stop beta blocker, monitor blood pressure, and make appointment with allergist.  Patient has been made aware of this per pharmacist.   Pharmacist has close patient to her services.   PLAN: RNCM will follow up with patient at next scheduled outreach.   Quinn Plowman RN,BSN,CCM [facility] Telephonic  [phone]

## 2016-08-09 NOTE — Progress Notes (Signed)
Subjective:  Patient ID: [name], female    DOB: 1930/04/17  Age: 83 y.o. MRN: [id number]  CC: No chief complaint on file.   HPI [name] presents for fatigue (CFS), IBS, allergies f/u   Outpatient Medications Prior to Visit  Medication Sig Dispense Refill  . allopurinol (ZYLOPRIM) 300 MG tablet TAKE 1 TABLET BY MOUTH DAILY 30 tablet 1  . cetirizine (ZYRTEC) 10 MG tablet Take 10 mg by mouth daily as needed for allergies.    [name] escitalopram (LEXAPRO) 10 MG tablet TAKE 1 TABLET (10 MG) BY MOUTH DAILY AT BEDTIME. 90 tablet 0  . feeding supplement, ENSURE ENLIVE, (ENSURE ENLIVE) LIQD Take 237 mLs by mouth 2 (two) times daily between meals. 237 mL 12  . fluocinonide cream (LIDEX) 6.30 % Apply 1 application topically daily as needed (For spots on face/itching.).     [name] KLOR-CON M20 20 MEQ tablet TAKE 1 TABLET (20 MEQ TOTAL) BY MOUTH DAILY. 30 tablet 3  . levothyroxine (SYNTHROID, LEVOTHROID) 100 MCG tablet Take 1 tablet (100 mcg total) by mouth daily. 90 tablet 3  . LORazepam (ATIVAN) 0.5 MG tablet Take 1 tab daily and 2nd as needed 45 tablet 1  . oxybutynin (DITROPAN) 5 MG tablet Take 1 tablet (5 mg total) by mouth daily as needed. 30 tablet 3  . psyllium (METAMUCIL SMOOTH TEXTURE) 28 % packet Take 1 packet by mouth daily with breakfast.    . ranitidine (ZANTAC) 150 MG tablet Take 150 mg by mouth 3 (three) times daily.    [name] spironolactone (ALDACTONE) 25 MG tablet Take 1 tablet (25 mg total) by mouth daily. 90 tablet 1   No facility-administered medications prior to visit.     ROS Review of Systems  Constitutional: Positive for fatigue. Negative for activity change, appetite change, chills and unexpected weight change.  HENT: Negative for congestion, mouth sores and sinus pressure.   Eyes: Negative for visual disturbance.  Respiratory: Negative for cough and chest tightness.   Gastrointestinal: Negative for abdominal pain and nausea.  Genitourinary: Negative for difficulty  urinating, frequency and vaginal pain.  Musculoskeletal: Negative for back pain and gait problem.  Skin: Negative for pallor and rash.  Neurological: Negative for dizziness, tremors, weakness, numbness and headaches.  Psychiatric/Behavioral: Negative for confusion and sleep disturbance. The patient is nervous/anxious.     Objective:  BP 128/84 (BP Location: Left Arm, Patient Position: Sitting, Cuff Size: Normal)   Pulse 71   Temp 97.8 F (36.6 C) (Oral)   Ht 5\' 4"  (1.626 m)   Wt 167 lb 1.3 oz (75.8 kg)   SpO2 100%   BMI 28.68 kg/m   BP Readings from Last 3 Encounters:  08/09/16 128/84  07/20/16 138/80  07/13/16 138/82    Wt Readings from Last 3 Encounters:  08/09/16 167 lb 1.3 oz (75.8 kg)  07/28/16 163 lb (73.9 kg)  07/20/16 166 lb 8 oz (75.5 kg)    Physical Exam  Constitutional: She appears well-developed. No distress.  HENT:  Head: Normocephalic.  Right Ear: External ear normal.  Left Ear: External ear normal.  Nose: Nose normal.  Mouth/Throat: Oropharynx is clear and moist.  Eyes: Conjunctivae are normal. Pupils are equal, round, and reactive to light. Right eye exhibits no discharge. Left eye exhibits no discharge.  Neck: Normal range of motion. Neck supple. No JVD present. No tracheal deviation present. No thyromegaly present.  Cardiovascular: Normal rate, regular rhythm and normal heart sounds.   Pulmonary/Chest: No stridor. No respiratory distress.  She has no wheezes.  Abdominal: Soft. Bowel sounds are normal. She exhibits no distension and no mass. There is no tenderness. There is no rebound and no guarding.  Musculoskeletal: She exhibits no edema or tenderness.  Lymphadenopathy:    She has no cervical adenopathy.  Neurological: She displays normal reflexes. No cranial nerve deficit. She exhibits normal muscle tone. Coordination normal.  Skin: No rash noted. No erythema.  Psychiatric: She has a normal mood and affect. Her behavior is normal. Judgment and  thought content normal.  obese A little ataxic  Lab Results  Component Value Date   WBC 4.6 08/06/2016   HGB 13.0 08/06/2016   HCT 39.4 08/06/2016   PLT 197.0 08/06/2016   GLUCOSE 101 (H) 08/06/2016   CHOL 174 05/11/2010   TRIG 78.0 05/11/2010   HDL 52.30 05/11/2010   LDLDIRECT 130.8 09/30/2006   LDLCALC 106 (H) 05/11/2010   ALT 11 08/06/2016   AST 17 08/06/2016   NA 137 08/06/2016   K 4.5 08/06/2016   CL 102 08/06/2016   CREATININE 1.09 08/06/2016   BUN 24 (H) 08/06/2016   CO2 31 08/06/2016   TSH 1.700 03/22/2016   INR 1.08 06/30/2016   HGBA1C 6.0 04/10/2012    Ct Abdomen Pelvis W Contrast  Result Date: 06/30/2016 CLINICAL DATA:  Rectal bleeding. Left lower quadrant abdominal pain. Symptoms became worse today. Personal history of anal fissures and diverticulitis. IBS. Personal history of breast cancer. EXAM: CT ABDOMEN AND PELVIS WITH CONTRAST TECHNIQUE: Multidetector CT imaging of the abdomen and pelvis was performed using the standard protocol following bolus administration of intravenous contrast. CONTRAST:  113mL ISOVUE-300 IOPAMIDOL (ISOVUE-300) INJECTION 61% COMPARISON:  CT of the chest, abdomen, and pelvis 02/11/2009. FINDINGS: Lower chest: Lung bases are clear without focal nodule, mass, or airspace disease. The heart is mildly enlarged. There is no edema or effusion. Pacing wires are in place. Hepatobiliary: No focal intrahepatic biliary dilation is present. Multiple gallstones are noted. There is no inflammatory change about the gallbladder. Pancreas: No significant inflammatory changes are present. There is mild dilation of the pancreatic duct. No obstructing mass lesion is evident. Spleen: Normal in size without focal abnormality. Adrenals/Urinary Tract: Adrenal glands are normal bilaterally. A 2.5 cm cyst is present at the lower pole of the left kidney. The focally tear present. There are no stones. The urinary bladder is within normal limits. Stomach/Bowel: A small hiatal  hernia is present. The stomach and duodenum are otherwise unremarkable. Small bowel is within normal limits. Appendix is visualized and normal. The ascending and transverse colon are within normal limits. Diverticular changes are present in the descending and sigmoid colon. There is focal wall thickening inflammatory change in the distal sigmoid colon. The rectum is unremarkable. No anal fissures are evident. Vascular/Lymphatic: Atherosclerotic calcifications are present in the aorta and branch vessels without aneurysm. No significant retroperitoneal or pelvic adenopathy is present. Reproductive: Hysterectomy is noted. The ovaries are not discretely visualized and may be surgically absent as well. Other: No significant free fluid or free air is present. Musculoskeletal: Degenerative endplate changes and facet arthropathy are present in the lower lumbar spine. No focal lytic or blastic lesions are present. Vertebral body heights are maintained. Bony pelvis is intact. The hips are located. Degenerative changes are noted at the SI joints bilaterally. IMPRESSION: 1. Inflammatory changes of the sigmoid colon with diverticula compatible with acute diverticulitis. There is no abscess or free air. No complicating features are evident. 2. Additional diverticular present within the remaining  sigmoid colon and descending colon without other inflammatory change. 3. Small hiatal hernia. 4. Mild atherosclerotic changes within the aorta and branch vessels. 5. Mild cardiomegaly without failure. Electronically Signed   By: San Morelle M.D.   On: 06/30/2016 20:22   Dg Abd Portable 1v  Result Date: 07/01/2016 CLINICAL DATA:  Nausea and vomiting EXAM: PORTABLE ABDOMEN - 1 VIEW COMPARISON:  06/30/2016 FINDINGS: Scattered large and small bowel gas is seen. No obstructive changes are seen. No free air is noted. Mild degenerative change of the lumbar spine is noted. IMPRESSION: No acute abnormality seen. Electronically Signed    By: Inez Catalina M.D.   On: 07/01/2016 15:49    Assessment & Plan:   There are no diagnoses linked to this encounter. I am having Ms. Stubbe maintain her levothyroxine, fluocinonide cream, spironolactone, ranitidine, cetirizine, psyllium, allopurinol, feeding supplement (ENSURE ENLIVE), LORazepam, oxybutynin, KLOR-CON M20, and escitalopram.  No orders of the defined types were placed in this encounter.    Follow-up: No Follow-up on file.  Walker Kehr, MD

## 2016-08-09 NOTE — Assessment & Plan Note (Signed)
Appt w/Dr Medoff/colonoscopy

## 2016-08-09 NOTE — Assessment & Plan Note (Signed)
Spironolactone

## 2016-08-09 NOTE — Assessment & Plan Note (Signed)
No CP 

## 2016-08-09 NOTE — Progress Notes (Signed)
Pre visit review using our clinic review tool, if applicable. No additional management support is needed unless otherwise documented below in the visit note. 

## 2016-08-11 DIAGNOSIS — J302 Other seasonal allergic rhinitis: Secondary | ICD-10-CM | POA: Diagnosis not present

## 2016-08-11 DIAGNOSIS — J301 Allergic rhinitis due to pollen: Secondary | ICD-10-CM | POA: Diagnosis not present

## 2016-08-12 DIAGNOSIS — Z86010 Personal history of colonic polyps: Secondary | ICD-10-CM | POA: Diagnosis not present

## 2016-08-12 DIAGNOSIS — K5790 Diverticulosis of intestine, part unspecified, without perforation or abscess without bleeding: Secondary | ICD-10-CM | POA: Diagnosis not present

## 2016-08-12 DIAGNOSIS — K5730 Diverticulosis of large intestine without perforation or abscess without bleeding: Secondary | ICD-10-CM | POA: Diagnosis not present

## 2016-08-12 DIAGNOSIS — K921 Melena: Secondary | ICD-10-CM | POA: Diagnosis not present

## 2016-08-12 DIAGNOSIS — K625 Hemorrhage of anus and rectum: Secondary | ICD-10-CM | POA: Diagnosis not present

## 2016-08-12 DIAGNOSIS — Z1211 Encounter for screening for malignant neoplasm of colon: Secondary | ICD-10-CM | POA: Diagnosis not present

## 2016-08-14 ENCOUNTER — Other Ambulatory Visit: Payer: Self-pay | Admitting: Internal Medicine

## 2016-08-16 ENCOUNTER — Ambulatory Visit (INDEPENDENT_AMBULATORY_CARE_PROVIDER_SITE_OTHER): Payer: Medicare Other | Admitting: Psychology

## 2016-08-16 DIAGNOSIS — J3089 Other allergic rhinitis: Secondary | ICD-10-CM | POA: Diagnosis not present

## 2016-08-16 DIAGNOSIS — J301 Allergic rhinitis due to pollen: Secondary | ICD-10-CM | POA: Diagnosis not present

## 2016-08-16 DIAGNOSIS — F332 Major depressive disorder, recurrent severe without psychotic features: Secondary | ICD-10-CM | POA: Diagnosis not present

## 2016-08-17 ENCOUNTER — Encounter: Payer: Self-pay | Admitting: Cardiology

## 2016-08-17 DIAGNOSIS — J3089 Other allergic rhinitis: Secondary | ICD-10-CM | POA: Diagnosis not present

## 2016-08-17 DIAGNOSIS — J301 Allergic rhinitis due to pollen: Secondary | ICD-10-CM | POA: Diagnosis not present

## 2016-08-18 ENCOUNTER — Other Ambulatory Visit: Payer: Self-pay

## 2016-08-18 NOTE — Patient Outreach (Signed)
[facility]) Care Management  08/18/2016  [name] [birth year] [id number]  EMMI: heart failure Referral date: 08/18/16 Referral source:EMMI heart failure red alert Referral reason: weight 163 Day # 43  Telephone call to patient for EMMI heart failure red alert.  HIPAA verified with patient. Patient reports she is not experiencing any shortness of breath or swelling at this time.Patient states she is aware of the 3 lb weight gain overnight. Patient states she will at times not take her fluid pill in the morning when she knows she has to go out for the day. Patient states it causes her to have to go to the bathroom so much. Patient states she knows she is suppose to take her fluid pill daily but she just gets tired of doing things everybody wants her to do. Patient states she appreciates the calls from the [facility] automated program and calls from this [facility]. Patient states she wants the calls to continue. Patient states she has not been weighing the same time daily and recording. RNCM advised patient to weigh daily around the same time and record. RNCM discussed with patient importance of monitoring weight, limiting sodium, reporting symptoms to her doctor, and taking her medications as prescribed.  RNCM, discussed importance of patient managing her heart failure at home to avoid increased potential for hospitalizations. Patient verbalized understanding. Patient states she is going to try to do better with weighing and recording weights daily and taking her fluid pill consistently. Patient states she takes all of her other medications as prescribed.  Patient reports she saw her counselor on Monday 08/16/16, had her allergy shot on yesterday 08/17/16 and had her colonoscopy on 08/12/16.  Patient states her colonoscopy came out fine.Patient state she is concerned that her gastroenterologist informed her there is not much she could do not to have the GI bleeding to reoccur. Patient states she is  not satisfied with that answer. Patient states she is going to ask her primary MD to refer her to a dietician.  Patient voiced frustration regarding issues she is having wth her [employer] computer and the loss of some of her password information. Patient states she uses her computer to help to manage her bills. RNCM advised patient to contact the [employer] support center for assistance. Patient states she does have the number. Patient states she has to do everything for herself such as managing her bills and household. Patient states it gets overwhelming at times.  Patient states the problem with her computer has caused her some frustration. Patient states she will call [employer] support.  RNCM advised patient to keep all of her appointment dates and bill dates on single calendar. Advised patient to keep her passwords written in a private place  ASSESSMENT: Patient will benefit from ongoing heart failure education/ management.   PLAN: RNCM will follow up with patient at next scheduled outreach.   Quinn Plowman RN,BSN,CCM [facility] Telephonic  [phone]

## 2016-08-19 DIAGNOSIS — L810 Postinflammatory hyperpigmentation: Secondary | ICD-10-CM | POA: Diagnosis not present

## 2016-08-19 DIAGNOSIS — J3089 Other allergic rhinitis: Secondary | ICD-10-CM | POA: Diagnosis not present

## 2016-08-19 DIAGNOSIS — J301 Allergic rhinitis due to pollen: Secondary | ICD-10-CM | POA: Diagnosis not present

## 2016-08-23 ENCOUNTER — Other Ambulatory Visit: Payer: Self-pay

## 2016-08-23 NOTE — Patient Outreach (Signed)
[facility] [facility]) Care Management  08/23/2016  [name] 02-Jan-1930 [id number]   Telephone call to patient regarding telephone assessment. Unable to reach patient. HIPAA compliant voice message left with call back phone number.  PLAN:  RNCM will attempt 2nd telephone outreach to patient within 1 week.   Quinn Plowman RN,BSN,CCM [facility] Telephonic  [phone]

## 2016-08-24 DIAGNOSIS — J3089 Other allergic rhinitis: Secondary | ICD-10-CM | POA: Diagnosis not present

## 2016-08-24 DIAGNOSIS — R21 Rash and other nonspecific skin eruption: Secondary | ICD-10-CM | POA: Diagnosis not present

## 2016-08-24 DIAGNOSIS — J301 Allergic rhinitis due to pollen: Secondary | ICD-10-CM | POA: Diagnosis not present

## 2016-08-26 ENCOUNTER — Telehealth (HOSPITAL_COMMUNITY): Payer: Self-pay | Admitting: *Deleted

## 2016-08-26 DIAGNOSIS — I5022 Chronic systolic (congestive) heart failure: Secondary | ICD-10-CM

## 2016-08-26 NOTE — Telephone Encounter (Signed)
Patient called and complained of having really bad leg cramps at night and she thinks its from the spironolactone.  She is also taking potassium but stated her lasix was stopped sometime end of last year.   I spoke with [name] Kilts, PA and he said she can come in and we can check a BMET and Mag level.  Patient is agreeable to come in tomorrow.  Lab orders placed and appointment scheduled.

## 2016-08-27 ENCOUNTER — Other Ambulatory Visit: Payer: Self-pay

## 2016-08-27 ENCOUNTER — Telehealth: Payer: Self-pay | Admitting: *Deleted

## 2016-08-27 ENCOUNTER — Ambulatory Visit (HOSPITAL_COMMUNITY)
Admission: RE | Admit: 2016-08-27 | Discharge: 2016-08-27 | Disposition: A | Discharge status: Home / Self Care | Payer: Medicare Other | Source: Ambulatory Visit | Attending: Cardiology | Admitting: Cardiology

## 2016-08-27 DIAGNOSIS — J301 Allergic rhinitis due to pollen: Secondary | ICD-10-CM | POA: Diagnosis not present

## 2016-08-27 DIAGNOSIS — G4733 Obstructive sleep apnea (adult) (pediatric): Secondary | ICD-10-CM

## 2016-08-27 DIAGNOSIS — J3089 Other allergic rhinitis: Secondary | ICD-10-CM | POA: Diagnosis not present

## 2016-08-27 DIAGNOSIS — I5022 Chronic systolic (congestive) heart failure: Secondary | ICD-10-CM | POA: Diagnosis not present

## 2016-08-27 LAB — BASIC METABOLIC PANEL
Anion gap: 10 (ref 5–15)
BUN: 26 mg/dL — ABNORMAL HIGH (ref 6–20)
CO2: 24 mmol/L (ref 22–32)
Calcium: 9.1 mg/dL (ref 8.9–10.3)
Chloride: 100 mmol/L — ABNORMAL LOW (ref 101–111)
Creatinine, Ser: 0.98 mg/dL (ref 0.44–1.00)
GFR, EST AFRICAN AMERICAN: 60 mL/min — AB (ref >60)
GFR, EST NON AFRICAN AMERICAN: 52 mL/min — AB (ref >60)
GLUCOSE: 91 mg/dL (ref 65–99)
Potassium: 4.3 mmol/L (ref 3.5–5.1)
Sodium: 134 mmol/L — ABNORMAL LOW (ref 135–145)

## 2016-08-27 LAB — MAGNESIUM: Magnesium: 1.9 mg/dL (ref 1.7–2.4)

## 2016-08-27 NOTE — Patient Outreach (Signed)
[facility]) Care Management  08/27/2016  [name] [birth year] [id number]  TELEPHONE ASSESSMENT: Program: Diverticulitis Insurance: Medicare  PROVIDERS; Dr. Lew Dawes - primary MD Dr. Earlean Shawl - gastroenterologist Dr. Aundra Dubin - cardiology  SOCIAL: Patient lives alone.  Self care.  SUBJECTIVE; Telephone call to patient regarding telephone follow up assessment. HIPAA verified with patient. MEDICATION: patient states she is having cramps from taking the fluid pill. Patient states she has called her doctor and has an appointment for lab work today. Patient reports she did not take the fluid pill on yesterday due to the cramps. RNCM advised patient to monitor weight and symptoms closely.  Report symptoms to doctor as soon as possible.  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  Reports she also has a rash above her anus. Patient states she saw Dr. Earlean Shawl on 08/24/16 and he  gave her two cream prescriptions. Patient states she is unsure which prescription is for the fistula and which one is for the rash. Patient states she checked with her pharmacy but they did not know. Patient states she called her doctors office this morning to request clarification.  Patient reports yesterdays weight was 163. Patient states she has not weighed today. Patient states she is recording her weights.  RNCM discussed importance of patient weighing everyday and recording. Advised patient to weigh when she gets up every morning. Advised to keep notebook in bathroom to record. Patient verbalized understanding. Patient denies any increased shortness of breath or swelling.  Patient reports she does not have any food in the house that she can eat. Patient states she has not received her social security check yet. Patient states she does have some food but due to the type of diet she needs to be on for her diverticulitis she is not able to eat it. RNCM discussed and offered [facility] social  worker assistance for community resources and to discuss her financial situation. Patient refused. Patient states she is ok.  States she will call her daughter and her daughter will put money in her account. Patient states she should be able to get her groceries today. Patient states her church has a food pantry in the event she needed assistance.  RNCM advised patient once she has clarification on her new medication creams to apply the creams as directed.  RNCM advised patient to report any signs of continued bleeding to doctor.  Patient aware to access 911 for severe symptoms.  Patient has list of foods she should not eat due to diverticulitis. Patient aware not to eat foods with seeds/ nuts.   ASSESSMENT; Ongoing education and supportive care by telephonic RNCM.   PLAN; RNCM will follow up with patient within 1 week.  Patient will report outcome of blood work at next telephone outreach.   Quinn Plowman RN,BSN,CCM [facility] Telephonic  [phone]

## 2016-08-27 NOTE — Telephone Encounter (Signed)
Per Dr. Radford Pax gave patient recommended pressure changes and  understanding was verbalized. Patient understands that Dr. Radford Pax has ordered to set CPAP at 12cm H2O and repeat download in 2 weeks. Patient understands that [facility] will contact her about the change. Patient understands the order has been put in epic. [place] notified

## 2016-08-30 ENCOUNTER — Ambulatory Visit (INDEPENDENT_AMBULATORY_CARE_PROVIDER_SITE_OTHER): Payer: Medicare Other | Admitting: Psychology

## 2016-08-30 DIAGNOSIS — F332 Major depressive disorder, recurrent severe without psychotic features: Secondary | ICD-10-CM | POA: Diagnosis not present

## 2016-08-31 DIAGNOSIS — J3089 Other allergic rhinitis: Secondary | ICD-10-CM | POA: Diagnosis not present

## 2016-08-31 DIAGNOSIS — J301 Allergic rhinitis due to pollen: Secondary | ICD-10-CM | POA: Diagnosis not present

## 2016-09-01 ENCOUNTER — Other Ambulatory Visit: Payer: Self-pay

## 2016-09-01 NOTE — Patient Outreach (Signed)
[place] [facility]) Care Management  09/01/2016  [name] 1929/06/05 [id number]  Telephone assessment:   Telephone call to patient for follow up. HIPAA verified with patient. RNCM inquired if patient received update on her blood work. Patient states she has not hear back from her doctors office. Patient states she will call today.  Patient logged in to [facility]. Patient reports Potassium results 4.3 and magnesium level is 1.9.  RNCM discussed with patient that levels were within normal limits. Patient states she is still having some leg cramps but not as bad.  RNCM advised patient to call doctor to notify she is still having symptoms. Patient verbalized agreement.   Plan:  RNCM will follow up with patient within the month of June 2018.   Quinn Plowman RN,BSN,CCM [facility] Telephonic  [phone]

## 2016-09-02 DIAGNOSIS — J301 Allergic rhinitis due to pollen: Secondary | ICD-10-CM | POA: Diagnosis not present

## 2016-09-02 DIAGNOSIS — J3089 Other allergic rhinitis: Secondary | ICD-10-CM | POA: Diagnosis not present

## 2016-09-05 ENCOUNTER — Other Ambulatory Visit: Payer: Self-pay | Admitting: Internal Medicine

## 2016-09-05 ENCOUNTER — Encounter: Payer: Self-pay | Admitting: Cardiology

## 2016-09-06 ENCOUNTER — Telehealth (HOSPITAL_COMMUNITY): Payer: Self-pay

## 2016-09-06 MED ORDER — MAGNESIUM OXIDE -MG SUPPLEMENT 200 MG PO TABS: 200.0000 mg | ORAL_TABLET | Freq: Every day | ORAL | 6 refills | Status: DC

## 2016-09-06 NOTE — Telephone Encounter (Signed)
Pharmacy requesting a refill on allopurinol 300mg  tablet. Would you like to refill? Please advise

## 2016-09-06 NOTE — Telephone Encounter (Signed)
Basic metabolic panel  Order: [id number]  Status:  Final result Visible to patient:  Yes (MyChart) Dx:  Chronic systolic heart failure ([place])  Notes recorded by Effie Berkshire, RN on 09/06/2016 at 10:48 AM EDT Patient aware and agreeable, Rx sent to preferred pharm electronically, due for 6 mo f/u with [facility], apt made. ------  Notes recorded by Kerry Dory, CMA on 08/31/2016 at 9:32 AM EDT Unable to reach patient. No answer unable to leave message  *[phone] ------  Notes recorded by Shirley Friar, PA-C on 08/27/2016 at 1:42 PM EDT K WNL and Mg 1.9. Can start on 200 mg Mag ox once daily, but only mildly low so likely not causing her cramps.     Legrand Como [address] Strang, PA-C 08/27/2016 1:42 PM

## 2016-09-07 DIAGNOSIS — J3089 Other allergic rhinitis: Secondary | ICD-10-CM | POA: Diagnosis not present

## 2016-09-07 DIAGNOSIS — J301 Allergic rhinitis due to pollen: Secondary | ICD-10-CM | POA: Diagnosis not present

## 2016-09-07 NOTE — Telephone Encounter (Signed)
This was a 1 time fill for the patient- this needs to be filled by her PCP.   Thanks!

## 2016-09-08 ENCOUNTER — Other Ambulatory Visit (HOSPITAL_COMMUNITY): Payer: Self-pay | Admitting: Cardiology

## 2016-09-08 MED ORDER — MAGNESIUM OXIDE -MG SUPPLEMENT 200 MG PO TABS: 200.0000 mg | ORAL_TABLET | Freq: Every day | ORAL | 6 refills | Status: DC

## 2016-09-08 NOTE — Telephone Encounter (Signed)
Pt called to request a 200 mg tab of magnesium  rx sent

## 2016-09-09 DIAGNOSIS — J301 Allergic rhinitis due to pollen: Secondary | ICD-10-CM | POA: Diagnosis not present

## 2016-09-09 DIAGNOSIS — J3089 Other allergic rhinitis: Secondary | ICD-10-CM | POA: Diagnosis not present

## 2016-09-13 ENCOUNTER — Ambulatory Visit: Payer: Medicare Other | Admitting: Psychology

## 2016-09-14 DIAGNOSIS — J3089 Other allergic rhinitis: Secondary | ICD-10-CM | POA: Diagnosis not present

## 2016-09-14 DIAGNOSIS — J301 Allergic rhinitis due to pollen: Secondary | ICD-10-CM | POA: Diagnosis not present

## 2016-09-16 ENCOUNTER — Encounter: Payer: Self-pay | Admitting: Cardiology

## 2016-09-16 ENCOUNTER — Ambulatory Visit (INDEPENDENT_AMBULATORY_CARE_PROVIDER_SITE_OTHER): Payer: Medicare Other | Admitting: Psychology

## 2016-09-16 DIAGNOSIS — J301 Allergic rhinitis due to pollen: Secondary | ICD-10-CM | POA: Diagnosis not present

## 2016-09-16 DIAGNOSIS — F332 Major depressive disorder, recurrent severe without psychotic features: Secondary | ICD-10-CM | POA: Diagnosis not present

## 2016-09-16 DIAGNOSIS — J3089 Other allergic rhinitis: Secondary | ICD-10-CM | POA: Diagnosis not present

## 2016-09-21 ENCOUNTER — Ambulatory Visit (HOSPITAL_COMMUNITY)
Admission: RE | Admit: 2016-09-21 | Discharge: 2016-09-21 | Disposition: A | Discharge status: Home / Self Care | Payer: Medicare Other | Source: Ambulatory Visit | Attending: Cardiology | Admitting: Cardiology

## 2016-09-21 ENCOUNTER — Encounter (HOSPITAL_COMMUNITY): Payer: Self-pay

## 2016-09-21 VITALS — BP 113/71 | HR 72 | Wt 169.5 lb

## 2016-09-21 DIAGNOSIS — F329 Major depressive disorder, single episode, unspecified: Secondary | ICD-10-CM | POA: Insufficient documentation

## 2016-09-21 DIAGNOSIS — Z950 Presence of cardiac pacemaker: Secondary | ICD-10-CM | POA: Diagnosis not present

## 2016-09-21 DIAGNOSIS — I712 Thoracic aortic aneurysm, without rupture, unspecified: Secondary | ICD-10-CM

## 2016-09-21 DIAGNOSIS — I77810 Thoracic aortic ectasia: Secondary | ICD-10-CM | POA: Insufficient documentation

## 2016-09-21 DIAGNOSIS — E039 Hypothyroidism, unspecified: Secondary | ICD-10-CM | POA: Insufficient documentation

## 2016-09-21 DIAGNOSIS — Z79899 Other long term (current) drug therapy: Secondary | ICD-10-CM | POA: Diagnosis not present

## 2016-09-21 DIAGNOSIS — J301 Allergic rhinitis due to pollen: Secondary | ICD-10-CM | POA: Diagnosis not present

## 2016-09-21 DIAGNOSIS — M109 Gout, unspecified: Secondary | ICD-10-CM | POA: Insufficient documentation

## 2016-09-21 DIAGNOSIS — I5032 Chronic diastolic (congestive) heart failure: Secondary | ICD-10-CM

## 2016-09-21 DIAGNOSIS — R0609 Other forms of dyspnea: Secondary | ICD-10-CM

## 2016-09-21 DIAGNOSIS — I10 Essential (primary) hypertension: Secondary | ICD-10-CM | POA: Diagnosis not present

## 2016-09-21 DIAGNOSIS — G4733 Obstructive sleep apnea (adult) (pediatric): Secondary | ICD-10-CM | POA: Insufficient documentation

## 2016-09-21 DIAGNOSIS — I7121 Aneurysm of the ascending aorta, without rupture: Secondary | ICD-10-CM

## 2016-09-21 DIAGNOSIS — R0600 Dyspnea, unspecified: Secondary | ICD-10-CM

## 2016-09-21 DIAGNOSIS — J3089 Other allergic rhinitis: Secondary | ICD-10-CM | POA: Diagnosis not present

## 2016-09-21 DIAGNOSIS — K219 Gastro-esophageal reflux disease without esophagitis: Secondary | ICD-10-CM | POA: Insufficient documentation

## 2016-09-21 NOTE — Patient Instructions (Signed)
MRA of your chest  We will contact you in 1 year to schedule your next appointment.

## 2016-09-21 NOTE — Progress Notes (Signed)
Patient ID: [name], female   DOB: 19-Oct-1929, 83 y.o.   MRN: [id number] PCP: Plotnikov EP: Caryl Comes HF Cardiology: Aundra Dubin  83 yo with history of SA nodal dysfunction s/p PPM, HTN, and chronic exertional dyspnea presents for cardiology followup.  She has been seeing Dr Chase Caller with pulmonary and sees Dr Caryl Comes for her pacemaker. Over the last few years, she has noted significant exertional dyspnea that has been progressive.  She is short of breath with steps or any incline.  She is not short of breath walking on flat ground but is short of breath carrying groceries or doing moderate housework.  No chest pain.  No lightheadedness or palpitations.  There was some concern for asthma, but she says that Breo did not help, and she is no longer using it.  She also has prominent fatigue.   Echo in 2/15 showed normal EF with mild pulmonary hypertension (PASP 39 mmHg).  Recent echo in 7/17, however, showed PA systolic pressure 30 mmHg with normal RV size and systolic function and LVEF 55-60%.  PFTs in 3/15 were normal except for a mildly decreased DLCO. She had a high resolution CT of the chest in 5/17 that showed no ILD but PA was dilated suggesting possible pulmonary hypertension.  She was referred to this office for evaluation of possible pulmonary hypertension based on these findings. I tried her on empiric Lasix 20 mg daily, but this did not help her dyspnea and she has now stopped it.   I had her do a Agricultural consultant in 9/17.  This was low risk with no ischemia or infarction. We finally ended up doing a right heart cath in 10/17 that showed normal filling pressures and no pulmonary hypertension.    Patient is now using CPAP nightly.  It has not really helped much.  Symptomatically, she is the same, no worse and no better.  She is now off pindolol with no change in symptoms.  She is trying to walk for exercise.  Weight is up 2 lbs.   Labs (3/17): TSH normal.  Labs (6/17): K 3.9, creatinine 1.01, RF  negative, ANA negative Labs (7/17): K 3.8, creatinine 0.91, BNP 224, HCT 41.1 Labs (9/17): K 4.2, creatinine 0.9, BNP 124 Labs (10/17): K 4.4, creatinine 0.98 Labs (11/17): TSH normal Labs (3/18): hgb 12.8 Labs (5/18): K 4.3, creatinine 0.98  PMH: 1. Gout 2. Depression 3. Hypothyroidism 4. SA node dysfunction with Medtronic PPM. 5. Stress echo (2012) normal. 6. GERD 7. IBS 8. HTN 9. Exertional dyspnea:  - PFTs (3/15) normal except for slightly decreased DLCO - Echo (2/15) with EF 65-70%, mild MR, PASP 39 mmHg.  - High resolution CT chest (5/17) with no evidence for IDL, PA enlarged concerning for pulmonary hypertension. - Possible asthma - Echo (7/17): EF 55-60%, mild MR with mitral valve prolapse, moderate TR, normal RV size and systolic function, PASP 30 mmHg.  - Lexiscan Cardiolite (9/17) with EF 55%, no ischemia/infarction - RHC (10/17) with mean RA 6, PA 28/9, mean PCWP 7, CI 2.37 10. Ascending aorta dilation: 4.0 cm on CT chest 5/17.  11. OSA: Using CPAP.  12. Diverticulitis 3/18  SH: Lives with son, nonsmoker, no ETOH.  FH: CVAs, father with MI, multiple siblings with coronary disease (has > 10 siblings).   ROS: All systems reviewed and negative except as per HPI.   Current Outpatient Prescriptions  Medication Sig Dispense Refill  . allopurinol (ZYLOPRIM) 300 MG tablet TAKE 1 TABLET BY MOUTH DAILY 30 tablet 1  .  cetirizine (ZYRTEC) 10 MG tablet Take 10 mg by mouth daily as needed for allergies.    . clotrimazole (LOTRIMIN) 1 % cream Apply 1 application topically 2 (two) times daily.    [name] escitalopram (LEXAPRO) 10 MG tablet TAKE 1 TABLET (10 MG) BY MOUTH DAILY AT BEDTIME. 90 tablet 0  . feeding supplement, ENSURE ENLIVE, (ENSURE ENLIVE) LIQD Take 237 mLs by mouth 2 (two) times daily between meals. 237 mL 12  . fluocinonide cream (LIDEX) 4.62 % Apply 1 application topically daily as needed (For spots on face/itching.).     [name] KLOR-CON M20 20 MEQ tablet TAKE 1 TABLET (20  MEQ TOTAL) BY MOUTH DAILY. 30 tablet 3  . levothyroxine (SYNTHROID, LEVOTHROID) 100 MCG tablet TAKE 1 TABLET (100 MCG TOTAL) BY MOUTH DAILY. 90 tablet 3  . LORazepam (ATIVAN) 0.5 MG tablet Take 1 tab daily and 2nd as needed 45 tablet 1  . Magnesium Oxide 200 MG TABS Take 1 tablet (200 mg total) by mouth daily. 30 tablet 6  . oxybutynin (DITROPAN) 5 MG tablet Take 1 tablet (5 mg total) by mouth daily as needed. 30 tablet 3  . psyllium (METAMUCIL SMOOTH TEXTURE) 28 % packet Take 1 packet by mouth daily with breakfast.    . ranitidine (ZANTAC) 150 MG tablet Take 150 mg by mouth 3 (three) times daily.    [name] spironolactone (ALDACTONE) 25 MG tablet TAKE 1 TABLET (25 MG TOTAL) BY MOUTH DAILY. 30 tablet 3   No current facility-administered medications for this encounter.    BP 113/71   Pulse 72   Wt 169 lb 8 oz (76.9 kg)   SpO2 100%   BMI 29.09 kg/m  General: NAD Neck: JVP 7 cm, no thyromegaly or thyroid nodule.  Lungs: CTAB CV: Nondisplaced PMI.  Heart regular S1/S2, no S3/S4, 1/6 SEM RUSB.  No peripheral edema.  No carotid bruit.  Normal pedal pulses.  Abdomen: Soft, nontender, no hepatosplenomegaly, no distention.  Skin: Intact without lesions or rashes.  Neurologic: Alert and oriented x 3.  Psych: Normal affect. Extremities: No clubbing or cyanosis.  HEENT: Normal.   Assessment/Plan: 1. Exertional dyspnea:  This has been progressive over the last two years, uncertain etiology.  There was some concern for asthma in the past but Breo inhaler did not help.  Echo in 2/15 showed normal EF with mildly elevated PA systolic pressure. However, more recent echo in 7/17 showed normal EF and RV function with no pulmonary hypertension.  PFTs in 3/15 were normal except for low DLCO, which could be indicative of pulmonary vascular disease.  High resolution chest CT in 5/17 did not show interstitial lung disease but did show an enlarged PA, concerning for possible pulmonary hypertension.  NYHA class II-III  symptoms.  Lexiscan Cardiolite in 9/17 showed no ischemia or infarction.  She does not appear significantly volume overloaded on exam, but BNP has been elevated (224 pre-Lasix and 124 after starting Lasix).  A trial of Lasix 20 mg daily did not help her breathing despite lowering BNP and she is now off Lasix.  Finally, I did a right heart cath in 10/17.  This showed normal filling pressures and no pulmonary hypertension.  I suspect the dyspnea is due to deconditioning and perhaps mild diastolic CHF given elevated BNP.   - I encouraged her to increase her activity level, walk more. - I do not think that she needs to restart Lasix.  2. Dilated ascending aorta: 4.0 cm on 5/17 CT.  I will  obtain repeat MRA chest to follow.  3. SA nodal dysfunction: She has a Medtronic PPM.  Followed by Dr Caryl Comes. 4. OSA: Now on CPAP.   I will see her back in 1 year    [name] 09/21/2016

## 2016-09-22 ENCOUNTER — Telehealth: Payer: Self-pay | Admitting: *Deleted

## 2016-09-22 DIAGNOSIS — G4733 Obstructive sleep apnea (adult) (pediatric): Secondary | ICD-10-CM

## 2016-09-22 NOTE — Telephone Encounter (Signed)
Informed patient of download results and patient understanding was verbalized. Patient understands a 2 week autotitration will be done from 4 to 20cm. Patient understands she will be contacted by [facility] to receive a loaner machine for 2 weeks. She understands to call if [facility] does not contact her with new setup in a timely manner. She was grateful for the call and thanked me  Per Dr Radford Pax a 2 week autotitration from 4 to 20cm has been placed in epic

## 2016-09-23 DIAGNOSIS — J3089 Other allergic rhinitis: Secondary | ICD-10-CM | POA: Diagnosis not present

## 2016-09-23 DIAGNOSIS — J301 Allergic rhinitis due to pollen: Secondary | ICD-10-CM | POA: Diagnosis not present

## 2016-09-23 NOTE — Telephone Encounter (Signed)
Patient states the last time she did an auto titration [facility] called her to say medicare called [facility] and said patient was not being compliant on her machine and [facility] told her she can private pay or turn in her machine and she said come get it. Then [facility] called back to say they forgot she was doing the 2 week auto on a loaner machine and did not add that time for her so they fixed the problem. Patient states she was a worried about doing this titration again but this time she will tell [facility] not to forget she is on a loaner machine for 2 weeks so she won't have that problem again.

## 2016-09-27 DIAGNOSIS — J3089 Other allergic rhinitis: Secondary | ICD-10-CM | POA: Diagnosis not present

## 2016-09-27 DIAGNOSIS — J301 Allergic rhinitis due to pollen: Secondary | ICD-10-CM | POA: Diagnosis not present

## 2016-09-28 ENCOUNTER — Ambulatory Visit (INDEPENDENT_AMBULATORY_CARE_PROVIDER_SITE_OTHER): Payer: Medicare Other | Admitting: *Deleted

## 2016-09-28 ENCOUNTER — Other Ambulatory Visit: Payer: Self-pay

## 2016-09-28 ENCOUNTER — Telehealth: Payer: Self-pay | Admitting: Cardiology

## 2016-09-28 DIAGNOSIS — I495 Sick sinus syndrome: Secondary | ICD-10-CM

## 2016-09-28 LAB — CUP PACEART REMOTE DEVICE CHECK
Battery Impedance: 2576 Ohm
Battery Voltage: 2.73 V
Brady Statistic AP VS Percent: 87 %
Brady Statistic AS VP Percent: 1 %
Date Time Interrogation Session: 20180605151622
Implantable Lead Implant Date: 20090527
Implantable Lead Location: 753860
Implantable Lead Model: 5076
Lead Channel Pacing Threshold Amplitude: 0.625 V
Lead Channel Pacing Threshold Pulse Width: 0.4 ms
Lead Channel Setting Pacing Amplitude: 2 V
Lead Channel Setting Pacing Amplitude: 2.5 V
Lead Channel Setting Pacing Pulse Width: 0.4 ms
Lead Channel Setting Sensing Sensitivity: 2 mV
MDC IDC LEAD IMPLANT DT: 20090527
MDC IDC LEAD LOCATION: 753859
MDC IDC MSMT BATTERY REMAINING LONGEVITY: 25 mo
MDC IDC MSMT LEADCHNL RA IMPEDANCE VALUE: 538 Ohm
MDC IDC MSMT LEADCHNL RA PACING THRESHOLD PULSEWIDTH: 0.4 ms
MDC IDC MSMT LEADCHNL RV IMPEDANCE VALUE: 537 Ohm
MDC IDC MSMT LEADCHNL RV PACING THRESHOLD AMPLITUDE: 0.5 V
MDC IDC PG IMPLANT DT: 20090527
MDC IDC STAT BRADY AP VP PERCENT: 4 %
MDC IDC STAT BRADY AS VS PERCENT: 8 %

## 2016-09-28 NOTE — Telephone Encounter (Signed)
New message    Pt is calling asking for a call back from RN. She did not say what it was about.

## 2016-09-28 NOTE — Progress Notes (Signed)
Remote pacemaker transmission.   

## 2016-09-28 NOTE — Patient Outreach (Signed)
[place] [facility]) Care Management  09/28/2016  [name] 12-17-1929 [id number]  TELEPHONE ASSESSMENT: Program: Diverticulitis Insurance: Medicare  PROVIDERS; Dr. Lew Dawes - primary MD Dr. Earlean Shawl - gastroenterologist Dr. Aundra Dubin - cardiology  SOCIAL: Patient lives alone.  Self care.  Call to patient for telephone assessment follow up . Unable to reach patient. HIPAA compliant voice message left with call back phone number.   PLAN; RNCM will attempt 2nd telephone outreach to patient within 1 week .  Quinn Plowman RN,BSN,CCM [facility] Telephonic  [phone]

## 2016-09-28 NOTE — Telephone Encounter (Signed)
Patient called to say [facility] called her about her mask having leaks in it during the autotitration. Patient was calling to say she needed a full face mask instead of the nasal pillow for her autotitration. Patient was advised to call [facility].

## 2016-09-28 NOTE — Telephone Encounter (Signed)
Spoke with pt and reminded pt of remote transmission that is due today. Pt verbalized understanding.   

## 2016-09-30 ENCOUNTER — Other Ambulatory Visit: Payer: Self-pay

## 2016-09-30 NOTE — Patient Outreach (Addendum)
[place] [facility]) Care Management  09/30/2016  [name] 1929-11-14 [id number]  Telephone assessment:  Telephone call to patient.  HIPAA compliant voice message left with call back phone number. Patient states the cramping in her legs have gotten much better. Patient states her doctor prescribed her magnesium to take for the cramps. Patient reports she received new tubing from [facility] for her CPAP machine. Patient states she talked with her doctor about getting a face mask for her machine. Patient states she will call [facility] today to see if they received the order from her doctor.  Patient reports she has a rash under her chin and on her neck. Patient states she was applying medication to her face prescribed by her dermatologist but has stopped using due to the rash. RNCM advised patient inform her dermatologist of the rash.  Patient verbalized understanding.  Patient reports she has a xray scheduled for tomorrow 10/01/16 for her cardiologist.  Patient states she checked her pacemaker on Tuesday 09/28/16.   Patient denies any additional concerns at this time.  Patient continues to adhere to her diet for diverticulitis. Continues to have bowel movements.  RNCM advised patient to continue to keep follow up appointments and take medications as prescribed.  RNCM advised patient to notify MD of any changes in condition prior to scheduled appointment. RNCM verified patient aware of 911 services for urgent/ emergent needs. Patient verbally agreed to next telephone follow up with RNCM.    Medications Reviewed Today    Reviewed by Dannielle Karvonen, RN (Registered Nurse) on 09/30/16 at 1124  Med List Status: <None>  Medication Order Taking? Sig Documenting Provider Last Dose Status Informant  allopurinol (ZYLOPRIM) 300 MG tablet [id number] Yes TAKE 1 TABLET BY MOUTH DAILY Deboraha Sprang, MD Taking Active Self  cetirizine (ZYRTEC) 10 MG tablet [id number] Yes Take 10 mg by  mouth daily as needed for allergies. [provider] Taking Active Self  clotrimazole (LOTRIMIN) 1 % cream [id number] No Apply 1 application topically 2 (two) times daily. [provider] Not Taking Active Self  escitalopram (LEXAPRO) 10 MG tablet [id number] Yes TAKE 1 TABLET (10 MG) BY MOUTH DAILY AT BEDTIME. Kathlee Nations, MD Taking Active   feeding supplement, ENSURE ENLIVE, (ENSURE ENLIVE) LIQD [id number] Yes Take 237 mLs by mouth 2 (two) times daily between meals. Reyne Dumas, MD Taking Active Self  fluocinonide cream (LIDEX) 0.05 % [id number] No Apply 1 application topically daily as needed (For spots on face/itching.).  [provider] Not Taking Active Self           Med Note Owens Shark, JASMINE Q   Tue Nov 04, 2015 11:51 AM)    KLOR-CON M20 20 MEQ tablet [id number] Yes TAKE 1 TABLET (20 MEQ TOTAL) BY MOUTH DAILY. Bensimhon, Shaune Pascal, MD Taking Active   levothyroxine (SYNTHROID, LEVOTHROID) 100 MCG tablet [id number] Yes TAKE 1 TABLET (100 MCG TOTAL) BY MOUTH DAILY. Plotnikov, Evie Lacks, MD Taking Active   LORazepam (ATIVAN) 0.5 MG tablet [id number] Yes Take 1 tab daily and 2nd as needed Arfeen, Arlyce Harman, MD Taking Active Self  Magnesium Oxide 200 MG TABS [id number] Yes Take 1 tablet (200 mg total) by mouth daily. Bensimhon, Shaune Pascal, MD Taking Active   oxybutynin (DITROPAN) 5 MG tablet [id number] Yes Take 1 tablet (5 mg total) by mouth daily as needed. Plotnikov, Evie Lacks, MD Taking Active Self           Med Note (BROWN, JASMINE Q  Tue Sep 21, 2016  9:20 AM)    psyllium (METAMUCIL SMOOTH TEXTURE) 28 % packet [id number] Yes Take 1 packet by mouth daily with breakfast. [provider] Taking Active Self  ranitidine (ZANTAC) 150 MG tablet [id number] Yes Take 150 mg by mouth 3 (three) times daily. [provider] Taking Active Self           Med Note Nyoka Cowden, Kie Calvin   Thu Sep 30, 2016 11:23 AM) Patient states she takes differently. Patient states she takes 1 tablet  per day.   spironolactone (ALDACTONE) 25 MG tablet [id number] Yes TAKE 1 TABLET (25 MG TOTAL) BY MOUTH DAILY. Plotnikov, Evie Lacks, MD Taking Active   Med List Note Colletta [place] Iline Oven 08/24/12 1534): probiotic          PLAN:  RNCM will follow up with patient within the month of July 2018.  Patient will report outcome of xray at next outreach.  Patient will report status of rash at next outreach.   Quinn Plowman RN,BSN,CCM [facility] Telephonic  [phone]

## 2016-10-01 ENCOUNTER — Encounter (HOSPITAL_COMMUNITY): Payer: Self-pay

## 2016-10-01 ENCOUNTER — Ambulatory Visit (HOSPITAL_COMMUNITY)
Admission: RE | Admit: 2016-10-01 | Discharge: 2016-10-01 | Disposition: A | Discharge status: Home / Self Care | Payer: Medicare Other | Source: Ambulatory Visit | Attending: Cardiology | Admitting: Cardiology

## 2016-10-01 DIAGNOSIS — J3089 Other allergic rhinitis: Secondary | ICD-10-CM | POA: Diagnosis not present

## 2016-10-01 DIAGNOSIS — I712 Thoracic aortic aneurysm, without rupture, unspecified: Secondary | ICD-10-CM

## 2016-10-01 DIAGNOSIS — J301 Allergic rhinitis due to pollen: Secondary | ICD-10-CM | POA: Diagnosis not present

## 2016-10-01 NOTE — Telephone Encounter (Signed)
-----   Message from Sueanne Margarita, MD sent at 09/30/2016  7:16 PM EDT ----- Set CPAP at 13cm H2O and get a download in 2 weeks

## 2016-10-01 NOTE — Telephone Encounter (Signed)
[place] notified of order change and download.

## 2016-10-05 ENCOUNTER — Encounter: Payer: Self-pay | Admitting: Cardiology

## 2016-10-05 DIAGNOSIS — J3089 Other allergic rhinitis: Secondary | ICD-10-CM | POA: Diagnosis not present

## 2016-10-05 DIAGNOSIS — J301 Allergic rhinitis due to pollen: Secondary | ICD-10-CM | POA: Diagnosis not present

## 2016-10-07 DIAGNOSIS — J301 Allergic rhinitis due to pollen: Secondary | ICD-10-CM | POA: Diagnosis not present

## 2016-10-07 DIAGNOSIS — J3089 Other allergic rhinitis: Secondary | ICD-10-CM | POA: Diagnosis not present

## 2016-10-08 DIAGNOSIS — H43813 Vitreous degeneration, bilateral: Secondary | ICD-10-CM | POA: Diagnosis not present

## 2016-10-08 DIAGNOSIS — H401231 Low-tension glaucoma, bilateral, mild stage: Secondary | ICD-10-CM | POA: Diagnosis not present

## 2016-10-08 DIAGNOSIS — H16142 Punctate keratitis, left eye: Secondary | ICD-10-CM | POA: Diagnosis not present

## 2016-10-08 DIAGNOSIS — Z961 Presence of intraocular lens: Secondary | ICD-10-CM | POA: Diagnosis not present

## 2016-10-08 DIAGNOSIS — H0289 Other specified disorders of eyelid: Secondary | ICD-10-CM | POA: Diagnosis not present

## 2016-10-12 DIAGNOSIS — J3089 Other allergic rhinitis: Secondary | ICD-10-CM | POA: Diagnosis not present

## 2016-10-12 DIAGNOSIS — J301 Allergic rhinitis due to pollen: Secondary | ICD-10-CM | POA: Diagnosis not present

## 2016-10-13 ENCOUNTER — Other Ambulatory Visit (INDEPENDENT_AMBULATORY_CARE_PROVIDER_SITE_OTHER): Payer: Medicare Other

## 2016-10-13 ENCOUNTER — Encounter: Payer: Self-pay | Admitting: Family Medicine

## 2016-10-13 ENCOUNTER — Ambulatory Visit (INDEPENDENT_AMBULATORY_CARE_PROVIDER_SITE_OTHER): Payer: Medicare Other | Admitting: Family Medicine

## 2016-10-13 VITALS — BP 132/84 | HR 78 | Temp 98.0°F | Wt 172.0 lb

## 2016-10-13 DIAGNOSIS — I2510 Atherosclerotic heart disease of native coronary artery without angina pectoris: Secondary | ICD-10-CM

## 2016-10-13 DIAGNOSIS — I10 Essential (primary) hypertension: Secondary | ICD-10-CM | POA: Diagnosis not present

## 2016-10-13 DIAGNOSIS — E032 Hypothyroidism due to medicaments and other exogenous substances: Secondary | ICD-10-CM | POA: Diagnosis not present

## 2016-10-13 DIAGNOSIS — K5792 Diverticulitis of intestine, part unspecified, without perforation or abscess without bleeding: Secondary | ICD-10-CM | POA: Diagnosis not present

## 2016-10-13 DIAGNOSIS — R8290 Unspecified abnormal findings in urine: Secondary | ICD-10-CM

## 2016-10-13 DIAGNOSIS — S39012A Strain of muscle, fascia and tendon of lower back, initial encounter: Secondary | ICD-10-CM

## 2016-10-13 DIAGNOSIS — M1A9XX0 Chronic gout, unspecified, without tophus (tophi): Secondary | ICD-10-CM

## 2016-10-13 DIAGNOSIS — D500 Iron deficiency anemia secondary to blood loss (chronic): Secondary | ICD-10-CM | POA: Diagnosis not present

## 2016-10-13 LAB — POCT URINALYSIS DIPSTICK
Bilirubin, UA: NEGATIVE
Blood, UA: NEGATIVE
Glucose, UA: NEGATIVE
Ketones, UA: NEGATIVE
Nitrite, UA: NEGATIVE
PROTEIN UA: NEGATIVE
Urobilinogen, UA: 0.2 E.U./dL
pH, UA: 7.5 (ref 5.0–8.0)

## 2016-10-13 LAB — IBC PANEL
IRON: 48 ug/dL (ref 42–145)
SATURATION RATIOS: 14.5 % — AB (ref 20.0–50.0)
Transferrin: 236 mg/dL (ref 212.0–360.0)

## 2016-10-13 LAB — BASIC METABOLIC PANEL
BUN: 30 mg/dL — AB (ref 6–23)
CHLORIDE: 98 meq/L (ref 96–112)
CO2: 29 meq/L (ref 19–32)
Calcium: 9.6 mg/dL (ref 8.4–10.5)
Creatinine, Ser: 0.95 mg/dL (ref 0.40–1.20)
GFR: 71.95 mL/min (ref >60.00)
GLUCOSE: 95 mg/dL (ref 70–99)
POTASSIUM: 4.6 meq/L (ref 3.5–5.1)
SODIUM: 133 meq/L — AB (ref 135–145)

## 2016-10-13 LAB — T4, FREE: Free T4: 0.7 ng/dL (ref 0.60–1.60)

## 2016-10-13 LAB — TSH: TSH: 4.45 u[IU]/mL (ref 0.35–4.50)

## 2016-10-13 MED ORDER — ALLOPURINOL 300 MG PO TABS: 300.0000 mg | ORAL_TABLET | Freq: Every day | ORAL | 5 refills | Status: DC

## 2016-10-13 NOTE — Progress Notes (Signed)
Subjective:    Patient ID: [name], female    DOB: 1929/11/17, 83 y.o.   MRN: [id number]  HPI This is an 83 yo female who presents today with back pain, feels like spasms. Started this week. Has had more trouble getting up from chair, no unusual activity. Some pain with turning over in bed. No numbness/tingling, no weakness, no dysuria, no frequency. She does not wish to take any medication. No dysuria, no frequency, no hematuria.    Was up 2 pounds at home this morning, a little more on our scale. No increased DOE or LE edema.   Requests refill of allopurinol. Has chronic, bilateral toe gout. Good control with daily allopurinol. Dr. Caryl Comes was prescribing but has asked her to get further refills from PCP.   Past Medical History:  Diagnosis Date  . Anxiety   . Bradycardia   . Breast cancer, left breast ([place]) 1995  . Breast cancer, right breast ([place]) 2002  . CHF (congestive heart failure) ([place])   . Depression    Dr Cheryln Manly  . Discoid lupus    skin  . Full dentures   . GERD (gastroesophageal reflux disease)   . Glaucoma   . Gout   . Hx of echocardiogram    a.  Echocardiogram (03/2011): Mild LVH, EF 34-19%, grade 1 diastolic dysfunction, mild MR, mild to moderate TR, PASP 42;  b. Echo (05/2013):  Mod LVH, EF 65-70%, no WMA, Gr 1 DD, mild MR, mod TR, PASP 39  . Hx of exercise stress test    a. ETT-echocardiogram (06/08/11): Sub-optimal exercise. Test stopped early due to dizziness and hypotension. EF 60%.  Non-diagnostic.;   b. Lexiscan Myoview (06/2013):  Diaphragmatic attenuation, no ischemia, EF 61%.  Low Risk  . Hypertension   . Hypothyroidism   . IBS (irritable bowel syndrome)   . IBS (irritable bowel syndrome)   . Incontinence   . OSA (obstructive sleep apnea) 07/20/2016   Mild with AHI 12/hr now on CPAP  . OSA on CPAP    "quit wearing mask; retested; not enough information; to be tested again 04/08/2016"  . Osteoarthritis   . Osteopenia   . Pacemaker 2009   Brady//Chronotropic incompetence with normal pacemaker function  . Thyroid disease   . Wears hearing aid    both ears   Past Surgical History:  Procedure Laterality Date  . BREAST BIOPSY Left 1995  . BREAST LUMPECTOMY Left 1995   axillary node dissectoin  . CARDIAC CATHETERIZATION N/A 02/13/2016   Procedure: Right Heart Cath;  Surgeon: Larey Dresser, MD;  Location: [place] [facility];  Service: Cardiovascular;  Laterality: N/A;  . CATARACT EXTRACTION W/ INTRAOCULAR LENS  IMPLANT, BILATERAL Bilateral 06/2009 - 12.2915   left - right  . DORSAL COMPARTMENT RELEASE  2000   left  . HAMMER TOE SURGERY Left 2004  . HEMORRHOID BANDING    . INSERT / REPLACE / REMOVE PACEMAKER  2009  . KNEE ARTHROSCOPY Left 08/29/2012   Procedure: LEFT KNEE ARTHROSCOPY ;  Surgeon: Hessie Dibble, MD;  Location: [place];  Service: Orthopedics;  Laterality: Left;  [name] MASTECTOMY Bilateral 09/2000   nbx  . TRIGGER FINGER RELEASE Right 07/31/2013   Procedure: EXCISE MASS RIGHT INDEX A-1 PLLLEY RELEASE A-1 RIGHT INDEX;  Surgeon: Cammie Sickle., MD;  Location: [place];  Service: Orthopedics;  Laterality: Right;  [name] VAGINAL HYSTERECTOMY     Family History  Problem Relation Age of Onset  .  Ovarian cancer Mother   . Heart disease Father   . Stroke Sister   . Stroke Brother   . Cancer Brother   . Heart disease Brother    Social History  Substance Use Topics  . Smoking status: Never Smoker  . Smokeless tobacco: Never Used  . Alcohol use No      Review of Systems Per HPI    Objective:   Physical Exam  Constitutional: She is oriented to person, place, and time. She appears well-developed and well-nourished.  HENT:  Head: Normocephalic and atraumatic.  Eyes: Conjunctivae are normal.  Cardiovascular: Normal rate.   Pulmonary/Chest: Effort normal.  Musculoskeletal: She exhibits no edema.       Lumbar back: She exhibits tenderness (paraspinal muslce tenderness). She  exhibits normal range of motion, no swelling, no edema and no deformity.  Neurological: She is alert and oriented to person, place, and time.  Skin: Skin is warm and dry.  Psychiatric: She has a normal mood and affect. Her behavior is normal. Thought content normal.  Vitals reviewed.   BP 132/84 (BP Location: Right Arm, Patient Position: Sitting, Cuff Size: Normal)   Pulse 78   Temp 98 F (36.7 C) (Oral)   Wt 172 lb (78 kg)   SpO2 97%   BMI 29.52 kg/m  Wt Readings from Last 3 Encounters:  10/13/16 172 lb (78 kg)  09/21/16 169 lb 8 oz (76.9 kg)  08/27/16 163 lb (73.9 kg)   Results for orders placed or performed in visit on 10/13/16  POCT urinalysis dipstick  Result Value Ref Range   Color, UA yellow    Clarity, UA clear    Glucose, UA neg    Bilirubin, UA neg    Ketones, UA neg    Spec Grav, UA <=1.005 (A) 1.010 - 1.025   Blood, UA neg    pH, UA 7.5 5.0 - 8.0   Protein, UA neg    Urobilinogen, UA 0.2 0.2 or 1.0 E.U./dL   Nitrite, UA neg    Leukocytes, UA Trace (A) Negative       Assessment & Plan:  1. Back strain, initial encounter - patient does not wish to take any medication including OTC analgesics, I provided her with some back exercises, encouraged her to try heat - RTC if worsening symptoms, no improvement - POCT urinalysis dipstick  2. Chronic gout involving toe without tophus, unspecified cause, unspecified laterality - allopurinol (ZYLOPRIM) 300 MG tablet; Take 1 tablet (300 mg total) by mouth daily.  Dispense: 30 tablet; Refill: 5  3. Abnormal urine - urine dip with trace leukocytes, will obtain culture - Urine Culture; Future   Clarene Reamer, FNP-BC  [facility]  10/16/2016 6:54 AM

## 2016-10-13 NOTE — Patient Instructions (Addendum)
Can apply heat Exercises can be done on bed  I have sent your urine for culture and will let you know there is bacteria growth  Back Exercises If you have pain in your back, do these exercises 2-3 times each day or as told by your doctor. When the pain goes away, do the exercises once each day, but repeat the steps more times for each exercise (do more repetitions). If you do not have pain in your back, do these exercises once each day or as told by your doctor. Exercises Single Knee to Chest  Do these steps 3-5 times in a row for each leg: 1. Lie on your back on a firm bed or the floor with your legs stretched out. 2. Bring one knee to your chest. 3. Hold your knee to your chest by grabbing your knee or thigh. 4. Pull on your knee until you feel a gentle stretch in your lower back. 5. Keep doing the stretch for 10-30 seconds. 6. Slowly let go of your leg and straighten it.  Pelvic Tilt  Do these steps 5-10 times in a row: 1. Lie on your back on a firm bed or the floor with your legs stretched out. 2. Bend your knees so they point up to the ceiling. Your feet should be flat on the floor. 3. Tighten your lower belly (abdomen) muscles to press your lower back against the floor. This will make your tailbone point up to the ceiling instead of pointing down to your feet or the floor. 4. Stay in this position for 5-10 seconds while you gently tighten your muscles and breathe evenly.  Cat-Cow  Do these steps until your lower back bends more easily: 1. Get on your hands and knees on a firm surface. Keep your hands under your shoulders, and keep your knees under your hips. You may put padding under your knees. 2. Let your head hang down, and make your tailbone point down to the floor so your lower back is round like the back of a cat. 3. Stay in this position for 5 seconds. 4. Slowly lift your head and make your tailbone point up to the ceiling so your back hangs low (sags) like the back of a  cow. 5. Stay in this position for 5 seconds.  Press-Ups  Do these steps 5-10 times in a row: 1. Lie on your belly (face-down) on the floor. 2. Place your hands near your head, about shoulder-width apart. 3. While you keep your back relaxed and keep your hips on the floor, slowly straighten your arms to raise the top half of your body and lift your shoulders. Do not use your back muscles. To make yourself more comfortable, you may change where you place your hands. 4. Stay in this position for 5 seconds. 5. Slowly return to lying flat on the floor.  Bridges  Do these steps 10 times in a row: 1. Lie on your back on a firm surface. 2. Bend your knees so they point up to the ceiling. Your feet should be flat on the floor. 3. Tighten your butt muscles and lift your butt off of the floor until your waist is almost as high as your knees. If you do not feel the muscles working in your butt and the back of your thighs, slide your feet 1-2 inches farther away from your butt. 4. Stay in this position for 3-5 seconds. 5. Slowly lower your butt to the floor, and let your butt muscles  relax.  If this exercise is too easy, try doing it with your arms crossed over your chest. Belly Crunches  Do these steps 5-10 times in a row: 1. Lie on your back on a firm bed or the floor with your legs stretched out. 2. Bend your knees so they point up to the ceiling. Your feet should be flat on the floor. 3. Cross your arms over your chest. 4. Tip your chin a little bit toward your chest but do not bend your neck. 5. Tighten your belly muscles and slowly raise your chest just enough to lift your shoulder blades a tiny bit off of the floor. 6. Slowly lower your chest and your head to the floor.  Back Lifts Do these steps 5-10 times in a row: 1. Lie on your belly (face-down) with your arms at your sides, and rest your forehead on the floor. 2. Tighten the muscles in your legs and your butt. 3. Slowly lift your  chest off of the floor while you keep your hips on the floor. Keep the back of your head in line with the curve in your back. Look at the floor while you do this. 4. Stay in this position for 3-5 seconds. 5. Slowly lower your chest and your face to the floor.  Contact a doctor if:  Your back pain gets a lot worse when you do an exercise.  Your back pain does not lessen 2 hours after you exercise. If you have any of these problems, stop doing the exercises. Do not do them again unless your doctor says it is okay. Get help right away if:  You have sudden, very bad back pain. If this happens, stop doing the exercises. Do not do them again unless your doctor says it is okay. This information is not intended to replace advice given to you by your health care provider. Make sure you discuss any questions you have with your health care provider. Document Released: 05/15/2010 Document Revised: 09/18/2015 Document Reviewed: 06/06/2014 Elsevier Interactive Patient Education  [employer].

## 2016-10-14 DIAGNOSIS — J301 Allergic rhinitis due to pollen: Secondary | ICD-10-CM | POA: Diagnosis not present

## 2016-10-14 DIAGNOSIS — J3089 Other allergic rhinitis: Secondary | ICD-10-CM | POA: Diagnosis not present

## 2016-10-14 LAB — URINE CULTURE

## 2016-10-18 ENCOUNTER — Telehealth (HOSPITAL_COMMUNITY): Payer: Self-pay

## 2016-10-18 NOTE — Telephone Encounter (Signed)
Patient left VM to return call but did not specify needs. Left return call to patient to call us back and if needing to leave a VM to leave a detailed message of her needs/questions/concerns.  Renee Pain, RN

## 2016-10-20 ENCOUNTER — Telehealth (HOSPITAL_COMMUNITY): Payer: Self-pay | Admitting: *Deleted

## 2016-10-20 DIAGNOSIS — J3089 Other allergic rhinitis: Secondary | ICD-10-CM | POA: Diagnosis not present

## 2016-10-20 DIAGNOSIS — J301 Allergic rhinitis due to pollen: Secondary | ICD-10-CM | POA: Diagnosis not present

## 2016-10-20 NOTE — Telephone Encounter (Signed)
-----   Message from Larey Dresser, MD sent at 10/19/2016  5:04 PM EDT ----- Would follow by echo  ----- Message ----- From: Scarlette Calico, RN Sent: 10/19/2016   4:59 PM To: Larey Dresser, MD  Hey per your last OV note: 2. Dilated ascending aorta: 4.0 cm on 5/17 CT.  I will obtain repeat MRA chest to follow  However due to her pacemaker could not have mra done

## 2016-10-22 DIAGNOSIS — J3089 Other allergic rhinitis: Secondary | ICD-10-CM | POA: Diagnosis not present

## 2016-10-22 DIAGNOSIS — J301 Allergic rhinitis due to pollen: Secondary | ICD-10-CM | POA: Diagnosis not present

## 2016-10-25 ENCOUNTER — Ambulatory Visit (INDEPENDENT_AMBULATORY_CARE_PROVIDER_SITE_OTHER): Payer: Medicare Other | Admitting: Psychology

## 2016-10-25 DIAGNOSIS — F332 Major depressive disorder, recurrent severe without psychotic features: Secondary | ICD-10-CM

## 2016-10-26 ENCOUNTER — Encounter: Payer: Self-pay | Admitting: Cardiology

## 2016-10-28 DIAGNOSIS — J301 Allergic rhinitis due to pollen: Secondary | ICD-10-CM | POA: Diagnosis not present

## 2016-10-28 DIAGNOSIS — J3089 Other allergic rhinitis: Secondary | ICD-10-CM | POA: Diagnosis not present

## 2016-10-29 ENCOUNTER — Other Ambulatory Visit: Payer: Self-pay

## 2016-10-29 NOTE — Patient Outreach (Signed)
[facility]) Care Management  10/29/2016  Mita Vallo Oct 04, 1929 [id number]   Case Closure:  Telephone call to patient regarding telephone assessment and case closure.  HIPAA verified with patient.  Patient states she is doing pretty good. Patient states the rash has cleared up. Patient states she has extra medication to use in the event she has flare ups from the rash.  Patient states she went to have the xray's done but was told after being at the facility that she cannot have the xray done due to her pacemaker. Patient states she has her next pacemaker check in September 2018 and has her yearly check in December 2018. Patient states she has a follow up with her primary MD the month of July 2018.  Patient states she is not having any problems with her diverticulitis or IBS. Patient states he daughter has a garden and she has been able to eat fresh vegetables.  Patient states she continues to be on a vegan diet.  Patient denies any further concerns at this time.  Discussed case closure with patient due to goal being met. Patient verbally agreed.  Patient advised to continue to keep follow up appointments.  Report symptoms to doctor as needed Patient advised to take medications as prescribed.   PLAN; RNCM will refer patient to care management assistant to close due to goals  Being met. RNCM will send patients primary MD notification of closure. RNCM will send patient closure letter.   Quinn Plowman RN,BSN,CCM [facility] Telephonic  [phone]

## 2016-11-03 DIAGNOSIS — J3089 Other allergic rhinitis: Secondary | ICD-10-CM | POA: Diagnosis not present

## 2016-11-03 DIAGNOSIS — J301 Allergic rhinitis due to pollen: Secondary | ICD-10-CM | POA: Diagnosis not present

## 2016-11-04 ENCOUNTER — Other Ambulatory Visit: Payer: Self-pay | Admitting: Internal Medicine

## 2016-11-04 ENCOUNTER — Encounter (HOSPITAL_COMMUNITY): Payer: Self-pay | Admitting: Psychiatry

## 2016-11-04 ENCOUNTER — Ambulatory Visit (INDEPENDENT_AMBULATORY_CARE_PROVIDER_SITE_OTHER): Payer: Medicare Other | Admitting: Psychiatry

## 2016-11-04 DIAGNOSIS — F330 Major depressive disorder, recurrent, mild: Secondary | ICD-10-CM

## 2016-11-04 DIAGNOSIS — I2510 Atherosclerotic heart disease of native coronary artery without angina pectoris: Secondary | ICD-10-CM

## 2016-11-04 MED ORDER — ESCITALOPRAM OXALATE 10 MG PO TABS: ORAL_TABLET | ORAL | 0 refills | Status: DC

## 2016-11-04 MED ORDER — LORAZEPAM 0.5 MG PO TABS: ORAL_TABLET | ORAL | 1 refills | Status: DC

## 2016-11-04 NOTE — Progress Notes (Signed)
[place] MD/PA/NP OP Progress Note  11/04/2016 3:06 PM [name]  MRN:  [id number]  Chief Complaint:  Chief Complaint    Follow-up     Subjective:  I am taking medication.  I'm sleeping better.  HPI: Patient came for her follow-up appointment.  She continues to have a lot of health issues but she is having much better.  She has chronic GI issues and recently her heart condition is also getting worse.  She regrets that her children did not came to visit when she was in the hospital.  She feels sometimes lonely and isolated.  However she described her depression is a stable.  She is seeing Dr. Cheryln Manly for counseling.  She denies any crying spells, irritability, anhedonia, hopelessness or worthlessness.  She sleeping good.  Her energy level is low due to her own health conditions.  She takes lorazepam only as needed.  She lives by herself but her daughter comes some time to help grossly.  Patient told her other to some does not involved in her treatment plan.  Her appetite is okay.  Her vital signs are stable.  Patient denies any paranoia or any hallucination.  She wants to continue Lexapro 10 mg daily.  She does not want increase the dose of Lexapro.  Patient denies any drug use.  Visit Diagnosis:    ICD-10-CM   1. Major depressive disorder, recurrent episode, mild (HCC) F33.0 escitalopram (LEXAPRO) 10 MG tablet    LORazepam (ATIVAN) 0.5 MG tablet    Past Psychiatric History: Reviewed. Patient has one history of psychiatric inpatient treatment many years ago when she was admitted due to suicidal thinking. Patient denies any history of suicidal attempt or any psychosis.  Past Medical History:  Past Medical History:  Diagnosis Date  . Anxiety   . Bradycardia   . Breast cancer, left breast ([place]) 1995  . Breast cancer, right breast ([place]) 2002  . CHF (congestive heart failure) ([place])   . Depression    Dr Cheryln Manly  . Discoid lupus    skin  . Full dentures   . GERD (gastroesophageal reflux  disease)   . Glaucoma   . Gout   . Hx of echocardiogram    a.  Echocardiogram (03/2011): Mild LVH, EF 69-67%, grade 1 diastolic dysfunction, mild MR, mild to moderate TR, PASP 42;  b. Echo (05/2013):  Mod LVH, EF 65-70%, no WMA, Gr 1 DD, mild MR, mod TR, PASP 39  . Hx of exercise stress test    a. ETT-echocardiogram (06/08/11): Sub-optimal exercise. Test stopped early due to dizziness and hypotension. EF 60%.  Non-diagnostic.;   b. Lexiscan Myoview (06/2013):  Diaphragmatic attenuation, no ischemia, EF 61%.  Low Risk  . Hypertension   . Hypothyroidism   . IBS (irritable bowel syndrome)   . IBS (irritable bowel syndrome)   . Incontinence   . OSA (obstructive sleep apnea) 07/20/2016   Mild with AHI 12/hr now on CPAP  . OSA on CPAP    "quit wearing mask; retested; not enough information; to be tested again 04/08/2016"  . Osteoarthritis   . Osteopenia   . Pacemaker 2009   Brady//Chronotropic incompetence with normal pacemaker function  . Thyroid disease   . Wears hearing aid    both ears    Past Surgical History:  Procedure Laterality Date  . BREAST BIOPSY Left 1995  . BREAST LUMPECTOMY Left 1995   axillary node dissectoin  . CARDIAC CATHETERIZATION N/A 02/13/2016   Procedure: Right Heart Cath;  Surgeon: Larey Dresser, MD;  Location: [place] [facility];  Service: Cardiovascular;  Laterality: N/A;  . CATARACT EXTRACTION W/ INTRAOCULAR LENS  IMPLANT, BILATERAL Bilateral 06/2009 - 12.2915   left - right  . DORSAL COMPARTMENT RELEASE  2000   left  . HAMMER TOE SURGERY Left 2004  . HEMORRHOID BANDING    . INSERT / REPLACE / REMOVE PACEMAKER  2009  . KNEE ARTHROSCOPY Left 08/29/2012   Procedure: LEFT KNEE ARTHROSCOPY ;  Surgeon: Hessie Dibble, MD;  Location: [facility];  Service: Orthopedics;  Laterality: Left;  [name] MASTECTOMY Bilateral 09/2000   nbx  . TRIGGER FINGER RELEASE Right 07/31/2013   Procedure: EXCISE MASS RIGHT INDEX A-1 PLLLEY RELEASE A-1 RIGHT INDEX;  Surgeon:  Cammie Sickle., MD;  Location: [place] [facility];  Service: Orthopedics;  Laterality: Right;  [name] VAGINAL HYSTERECTOMY      Family Psychiatric History: Reviewed.  Family History:  Family History  Problem Relation Age of Onset  . Ovarian cancer Mother   . Heart disease Father   . Stroke Sister   . Stroke Brother   . Cancer Brother   . Heart disease Brother     Social History:  Social History   Social History  . Marital status: Widowed    Spouse name: N/A  . Number of children: N/A  . Years of education: N/A   Occupational History  . retired    Social History Main Topics  . Smoking status: Never Smoker  . Smokeless tobacco: Never Used  . Alcohol use No  . Drug use: No  . Sexual activity: No   Other Topics Concern  . None   Social History Narrative   She is a vegan now 2010    Allergies:  Allergies  Allergen Reactions  . Lactase Diarrhea  . Amlodipine Besy-Benazepril Hcl Swelling  . Amlodipine Besylate Swelling  . Clonidine Hydrochloride     REACTION: tired  . Lactose Intolerance (Gi) Other (See Comments)  . Valsartan Other (See Comments)    Tongue swelling   . Verapamil     Metabolic Disorder Labs: Lab Results  Component Value Date   HGBA1C 6.0 04/10/2012   No results found for: PROLACTIN Lab Results  Component Value Date   CHOL 174 05/11/2010   TRIG 78.0 05/11/2010   HDL 52.30 05/11/2010   CHOLHDL 3 05/11/2010   VLDL 15.6 05/11/2010   LDLCALC 106 (H) 05/11/2010   LDLCALC 105 (H) 10/14/2009     Current Medications: Current Outpatient Prescriptions  Medication Sig Dispense Refill  . allopurinol (ZYLOPRIM) 300 MG tablet Take 1 tablet (300 mg total) by mouth daily. 30 tablet 5  . cetirizine (ZYRTEC) 10 MG tablet Take 10 mg by mouth daily as needed for allergies.    . clotrimazole (LOTRIMIN) 1 % cream Apply 1 application topically 2 (two) times daily.    [name] escitalopram (LEXAPRO) 10 MG tablet TAKE 1 TABLET (10 MG) BY MOUTH DAILY AT  BEDTIME. 90 tablet 0  . feeding supplement, ENSURE ENLIVE, (ENSURE ENLIVE) LIQD Take 237 mLs by mouth 2 (two) times daily between meals. 237 mL 12  . KLOR-CON M20 20 MEQ tablet TAKE 1 TABLET (20 MEQ TOTAL) BY MOUTH DAILY. 30 tablet 3  . levothyroxine (SYNTHROID, LEVOTHROID) 100 MCG tablet TAKE 1 TABLET (100 MCG TOTAL) BY MOUTH DAILY. 90 tablet 3  . LORazepam (ATIVAN) 0.5 MG tablet Take 1 tab daily and 2nd as needed 45 tablet 1  .  LOTEMAX 0.5 % ophthalmic suspension APPLY 1/2 INCH RIBBON INTO CONJUCTIVA OF EYE  0  . magnesium oxide (MAG-OX) 400 (241.3 Mg) MG tablet Take 0.5 tablets by mouth daily.  6  . oxybutynin (DITROPAN) 5 MG tablet Take 1 tablet (5 mg total) by mouth daily as needed. 30 tablet 3  . psyllium (METAMUCIL SMOOTH TEXTURE) 28 % packet Take 1 packet by mouth daily with breakfast.    . ranitidine (ZANTAC) 150 MG tablet Take 150 mg by mouth 3 (three) times daily.    [name] spironolactone (ALDACTONE) 25 MG tablet TAKE 1 TABLET (25 MG TOTAL) BY MOUTH DAILY. 30 tablet 3  . fluocinonide cream (LIDEX) 6.43 % Apply 1 application topically daily as needed (For spots on face/itching.).     . Magnesium Oxide 200 MG TABS Take 1 tablet (200 mg total) by mouth daily. 30 tablet 6   No current facility-administered medications for this visit.     Neurologic: Headache: No Seizure: No Paresthesias: No  Musculoskeletal: Strength & Muscle Tone: within normal limits Gait & Station: normal Patient leans: N/A  Psychiatric Specialty Exam: ROS  Blood pressure 112/70, pulse 64, height 5\' 4"  (1.626 m), weight 170 lb (77.1 kg), SpO2 99 %.Body mass index is 29.18 kg/m.  General Appearance: Casual  Eye Contact:  Good  Speech:  Clear and Coherent  Volume:  Normal  Mood:  Dysphoric  Affect:  Constricted  Thought Process:  Goal Directed  Orientation:  Full (Time, Place, and Person)  Thought Content: Rumination   Suicidal Thoughts:  No  Homicidal Thoughts:  No  Memory:  Immediate;   Good Recent;    Good Remote;   Good  Judgement:  Good  Insight:  Good  Psychomotor Activity:  Normal  Concentration:  Concentration: Good and Attention Span: Good  Recall:  Good  Fund of Knowledge: Good  Language: Good  Akathisia:  No  Handed:  Right  AIMS (if indicated):  0  Assets:  Communication Skills Desire for Improvement Housing Resilience  ADL's:  Intact  Cognition: WNL  Sleep:  improved   Assessment: Major depressive disorder, recurrent.  Plan: Reassurance given.  Patient is reluctant to increase the dose of Lexapro.  She like to continue 10 mg daily and lorazepam 0.5 mg 1-2 tablet as needed.  She has a lot of fear about benzodiazepine addiction.  She never asked for early refills.  Encouraged to keep appointment with Dr. Cheryln Manly for counseling.  Recommended to call us back if she has any question, concern or if she feels worsening of the symptom.  Follow-up in 3 months.  [name] T., MD 11/04/2016, 3:06 PM

## 2016-11-05 ENCOUNTER — Telehealth: Payer: Self-pay | Admitting: *Deleted

## 2016-11-05 DIAGNOSIS — G4733 Obstructive sleep apnea (adult) (pediatric): Secondary | ICD-10-CM

## 2016-11-05 NOTE — Telephone Encounter (Signed)
-----   Message from Sueanne Margarita, MD sent at 11/03/2016  2:46 PM EDT ----- Set CPAP at 13cm H2O and repeat download in 2 weeks

## 2016-11-05 NOTE — Telephone Encounter (Signed)
Informed patient that Dr. Radford Pax has changed her settings from 4cm H20 to 13cm H20 and verbalized understanding was indicated. Patient agreed and thanked me for the call. [place] notified

## 2016-11-08 ENCOUNTER — Ambulatory Visit (INDEPENDENT_AMBULATORY_CARE_PROVIDER_SITE_OTHER): Payer: Medicare Other | Admitting: Psychology

## 2016-11-08 ENCOUNTER — Encounter: Payer: Self-pay | Admitting: Internal Medicine

## 2016-11-08 ENCOUNTER — Ambulatory Visit (INDEPENDENT_AMBULATORY_CARE_PROVIDER_SITE_OTHER): Payer: Medicare Other | Admitting: Internal Medicine

## 2016-11-08 DIAGNOSIS — I480 Paroxysmal atrial fibrillation: Secondary | ICD-10-CM | POA: Diagnosis not present

## 2016-11-08 DIAGNOSIS — K2100 Gastro-esophageal reflux disease with esophagitis, without bleeding: Secondary | ICD-10-CM

## 2016-11-08 DIAGNOSIS — M1010 Lead-induced gout, unspecified site: Secondary | ICD-10-CM | POA: Diagnosis not present

## 2016-11-08 DIAGNOSIS — R0600 Dyspnea, unspecified: Secondary | ICD-10-CM

## 2016-11-08 DIAGNOSIS — N183 Chronic kidney disease, stage 3 unspecified: Secondary | ICD-10-CM

## 2016-11-08 DIAGNOSIS — F332 Major depressive disorder, recurrent severe without psychotic features: Secondary | ICD-10-CM

## 2016-11-08 DIAGNOSIS — I2510 Atherosclerotic heart disease of native coronary artery without angina pectoris: Secondary | ICD-10-CM | POA: Diagnosis not present

## 2016-11-08 DIAGNOSIS — T560X1S Toxic effect of lead and its compounds, accidental (unintentional), sequela: Secondary | ICD-10-CM

## 2016-11-08 DIAGNOSIS — I10 Essential (primary) hypertension: Secondary | ICD-10-CM | POA: Diagnosis not present

## 2016-11-08 DIAGNOSIS — R0609 Other forms of dyspnea: Secondary | ICD-10-CM

## 2016-11-08 NOTE — Assessment & Plan Note (Signed)
No angina 

## 2016-11-08 NOTE — Assessment & Plan Note (Signed)
No relapse 

## 2016-11-08 NOTE — Assessment & Plan Note (Signed)
On Spironolactone 

## 2016-11-08 NOTE — Assessment & Plan Note (Signed)
Zantac 

## 2016-11-08 NOTE — Assessment & Plan Note (Signed)
Chronic. 

## 2016-11-08 NOTE — Assessment & Plan Note (Signed)
Monitor labs 

## 2016-11-08 NOTE — Progress Notes (Signed)
Subjective:  Patient ID: [name], female    DOB: December 03, 1929  Age: 83 y.o. MRN: [id number]  CC: No chief complaint on file.   HPI Evelean Bigler presents for CFS, FMS, gout, IBS f/u  Outpatient Medications Prior to Visit  Medication Sig Dispense Refill  . allopurinol (ZYLOPRIM) 300 MG tablet Take 1 tablet (300 mg total) by mouth daily. 30 tablet 5  . cetirizine (ZYRTEC) 10 MG tablet Take 10 mg by mouth daily as needed for allergies.    . clotrimazole (LOTRIMIN) 1 % cream Apply 1 application topically 2 (two) times daily.    [name] escitalopram (LEXAPRO) 10 MG tablet TAKE 1 TABLET (10 MG) BY MOUTH DAILY AT BEDTIME. 90 tablet 0  . feeding supplement, ENSURE ENLIVE, (ENSURE ENLIVE) LIQD Take 237 mLs by mouth 2 (two) times daily between meals. 237 mL 12  . fluocinonide cream (LIDEX) 7.10 % Apply 1 application topically daily as needed (For spots on face/itching.).     [name] KLOR-CON M20 20 MEQ tablet TAKE 1 TABLET (20 MEQ TOTAL) BY MOUTH DAILY. 30 tablet 3  . levothyroxine (SYNTHROID, LEVOTHROID) 100 MCG tablet TAKE 1 TABLET (100 MCG TOTAL) BY MOUTH DAILY. 90 tablet 3  . LORazepam (ATIVAN) 0.5 MG tablet Take 1 tab daily and 2nd as needed 45 tablet 1  . LOTEMAX 0.5 % ophthalmic suspension APPLY 1/2 INCH RIBBON INTO CONJUCTIVA OF EYE  0  . Magnesium Oxide 200 MG TABS Take 1 tablet (200 mg total) by mouth daily. 30 tablet 6  . oxybutynin (DITROPAN) 5 MG tablet Take 1 tablet (5 mg total) by mouth daily as needed. 30 tablet 3  . psyllium (METAMUCIL SMOOTH TEXTURE) 28 % packet Take 1 packet by mouth daily with breakfast.    . ranitidine (ZANTAC) 150 MG tablet Take 150 mg by mouth 3 (three) times daily.    [name] spironolactone (ALDACTONE) 25 MG tablet TAKE 1 TABLET (25 MG TOTAL) BY MOUTH DAILY. 30 tablet 3  . magnesium oxide (MAG-OX) 400 (241.3 Mg) MG tablet Take 0.5 tablets by mouth daily.  6   No facility-administered medications prior to visit.     ROS Review of Systems  Constitutional: Positive for  fatigue. Negative for activity change, appetite change, chills and unexpected weight change.  HENT: Negative for congestion, mouth sores and sinus pressure.   Eyes: Negative for visual disturbance.  Respiratory: Negative for cough and chest tightness.   Gastrointestinal: Negative for abdominal pain and nausea.  Genitourinary: Negative for difficulty urinating, frequency and vaginal pain.  Musculoskeletal: Negative for back pain and gait problem.  Skin: Negative for pallor and rash.  Neurological: Negative for dizziness, tremors, weakness, numbness and headaches.  Psychiatric/Behavioral: Negative for confusion and sleep disturbance.    Objective:  BP 132/84 (BP Location: Left Arm, Patient Position: Sitting, Cuff Size: Large)   Pulse 71   Temp 98.6 F (37 C) (Oral)   Ht 5\' 4"  (1.626 m)   Wt 165 lb (74.8 kg)   SpO2 100%   BMI 28.32 kg/m   BP Readings from Last 3 Encounters:  11/08/16 132/84  10/13/16 132/84  09/21/16 113/71    Wt Readings from Last 3 Encounters:  11/08/16 165 lb (74.8 kg)  10/29/16 162 lb (73.5 kg)  10/13/16 172 lb (78 kg)    Physical Exam  Constitutional: She appears well-developed. No distress.  HENT:  Head: Normocephalic.  Right Ear: External ear normal.  Left Ear: External ear normal.  Nose: Nose normal.  Mouth/Throat: Oropharynx is  clear and moist.  Eyes: Pupils are equal, round, and reactive to light. Conjunctivae are normal. Right eye exhibits no discharge. Left eye exhibits no discharge.  Neck: Normal range of motion. Neck supple. No JVD present. No tracheal deviation present. No thyromegaly present.  Cardiovascular: Normal rate, regular rhythm and normal heart sounds.   Pulmonary/Chest: No stridor. No respiratory distress. She has no wheezes.  Abdominal: Soft. Bowel sounds are normal. She exhibits no distension and no mass. There is no tenderness. There is no rebound and no guarding.  Musculoskeletal: She exhibits no edema or tenderness.    Lymphadenopathy:    She has no cervical adenopathy.  Neurological: She displays normal reflexes. No cranial nerve deficit. She exhibits normal muscle tone. Coordination normal.  Skin: No rash noted. No erythema.  Psychiatric: She has a normal mood and affect. Her behavior is normal. Judgment and thought content normal.  less then trace edema  Lab Results  Component Value Date   WBC 4.6 08/06/2016   HGB 13.0 08/06/2016   HCT 39.4 08/06/2016   PLT 197.0 08/06/2016   GLUCOSE 95 10/13/2016   CHOL 174 05/11/2010   TRIG 78.0 05/11/2010   HDL 52.30 05/11/2010   LDLDIRECT 130.8 09/30/2006   LDLCALC 106 (H) 05/11/2010   ALT 11 08/06/2016   AST 17 08/06/2016   NA 133 (L) 10/13/2016   K 4.6 10/13/2016   CL 98 10/13/2016   CREATININE 0.95 10/13/2016   BUN 30 (H) 10/13/2016   CO2 29 10/13/2016   TSH 4.45 10/13/2016   INR 1.08 06/30/2016   HGBA1C 6.0 04/10/2012    No results found.  Assessment & Plan:   There are no diagnoses linked to this encounter. I am having Ms. Massiah maintain her fluocinonide cream, ranitidine, cetirizine, psyllium, feeding supplement (ENSURE ENLIVE), oxybutynin, KLOR-CON M20, spironolactone, levothyroxine, clotrimazole, Magnesium Oxide, LOTEMAX, allopurinol, escitalopram, and LORazepam.  No orders of the defined types were placed in this encounter.    Follow-up: No Follow-up on file.  Walker Kehr, MD

## 2016-11-08 NOTE — Assessment & Plan Note (Signed)
H/o PAF

## 2016-11-08 NOTE — Patient Instructions (Signed)
Try Turmeric for arthritis 

## 2016-11-09 DIAGNOSIS — J3089 Other allergic rhinitis: Secondary | ICD-10-CM | POA: Diagnosis not present

## 2016-11-09 DIAGNOSIS — J301 Allergic rhinitis due to pollen: Secondary | ICD-10-CM | POA: Diagnosis not present

## 2016-11-17 DIAGNOSIS — J3089 Other allergic rhinitis: Secondary | ICD-10-CM | POA: Diagnosis not present

## 2016-11-17 DIAGNOSIS — J301 Allergic rhinitis due to pollen: Secondary | ICD-10-CM | POA: Diagnosis not present

## 2016-11-25 DIAGNOSIS — J301 Allergic rhinitis due to pollen: Secondary | ICD-10-CM | POA: Diagnosis not present

## 2016-11-25 DIAGNOSIS — J3089 Other allergic rhinitis: Secondary | ICD-10-CM | POA: Diagnosis not present

## 2016-11-30 ENCOUNTER — Other Ambulatory Visit (HOSPITAL_COMMUNITY): Payer: Self-pay | Admitting: Internal Medicine

## 2016-12-01 DIAGNOSIS — J301 Allergic rhinitis due to pollen: Secondary | ICD-10-CM | POA: Diagnosis not present

## 2016-12-01 DIAGNOSIS — J3089 Other allergic rhinitis: Secondary | ICD-10-CM | POA: Diagnosis not present

## 2016-12-02 DIAGNOSIS — L821 Other seborrheic keratosis: Secondary | ICD-10-CM | POA: Diagnosis not present

## 2016-12-02 DIAGNOSIS — L810 Postinflammatory hyperpigmentation: Secondary | ICD-10-CM | POA: Diagnosis not present

## 2016-12-02 DIAGNOSIS — L931 Subacute cutaneous lupus erythematosus: Secondary | ICD-10-CM | POA: Diagnosis not present

## 2016-12-03 ENCOUNTER — Telehealth: Payer: Self-pay | Admitting: *Deleted

## 2016-12-03 DIAGNOSIS — J301 Allergic rhinitis due to pollen: Secondary | ICD-10-CM | POA: Diagnosis not present

## 2016-12-03 DIAGNOSIS — J3089 Other allergic rhinitis: Secondary | ICD-10-CM | POA: Diagnosis not present

## 2016-12-03 NOTE — Telephone Encounter (Signed)
Informed patient of compliance results and verbalized understanding was indicated. Patient understands Dr Radford Pax has ordered a 2 week autotitration from 4 to 20cm H20. Patient states she took her machine to [facility] because she knew she was having leaks and [facility] informed her that her mask had worn thin and that she was due for a new mask and supplies which were ordered. Informed [facility] Ailene Ravel) about the 2 week auto order and she states they just done a more recent one on  6-20 - 7-3 , AHI was 3.6. Ailene Ravel has faxed over the compliance report for you to view to make sure you still want this 2 week auto. It has been sent to be scanned.

## 2016-12-06 ENCOUNTER — Ambulatory Visit (INDEPENDENT_AMBULATORY_CARE_PROVIDER_SITE_OTHER): Payer: Medicare Other | Admitting: Psychology

## 2016-12-06 DIAGNOSIS — F332 Major depressive disorder, recurrent severe without psychotic features: Secondary | ICD-10-CM

## 2016-12-06 DIAGNOSIS — J3089 Other allergic rhinitis: Secondary | ICD-10-CM | POA: Diagnosis not present

## 2016-12-06 DIAGNOSIS — J301 Allergic rhinitis due to pollen: Secondary | ICD-10-CM | POA: Diagnosis not present

## 2016-12-07 ENCOUNTER — Encounter: Payer: Self-pay | Admitting: Cardiology

## 2016-12-09 ENCOUNTER — Telehealth: Payer: Self-pay | Admitting: Cardiology

## 2016-12-09 NOTE — Telephone Encounter (Signed)
Retrieved compliance report from [employer]. AHI=13 events per hour Leaks-L/min 95th percentile = 77.3  Spoke with Carmen at [facility] and instructed her to make appointment with the patient to come in and make sure the mask is fitting correctly (she just got a mask a few days ago).   Informed patient that Asencion Partridge will call her shortly to make appointment to ensure proper mask fitting. She was grateful for call and agrees with treatment plan.

## 2016-12-09 NOTE — Telephone Encounter (Signed)
New message   Pt wants to talk to someone about her sleep machine.  She states that she is not breathing enough air and she feels like she is going to suffocate. She called [facility] and they said she needed to call her cardiologist.

## 2016-12-09 NOTE — Telephone Encounter (Signed)
Informed patient of sleep study results and patient understanding was verbalized. Patient agrees with treatment and thanked me for calling.

## 2016-12-09 NOTE — Telephone Encounter (Signed)
-----   Message from Sueanne Margarita, MD sent at 12/09/2016 12:41 AM EDT ----- Please set CPAP at 14cm H2O and repeat download in 2 weeks

## 2016-12-13 ENCOUNTER — Other Ambulatory Visit: Payer: Self-pay | Admitting: Internal Medicine

## 2016-12-13 DIAGNOSIS — J3089 Other allergic rhinitis: Secondary | ICD-10-CM | POA: Diagnosis not present

## 2016-12-13 DIAGNOSIS — M65322 Trigger finger, left index finger: Secondary | ICD-10-CM | POA: Diagnosis not present

## 2016-12-13 DIAGNOSIS — J301 Allergic rhinitis due to pollen: Secondary | ICD-10-CM | POA: Diagnosis not present

## 2016-12-15 ENCOUNTER — Telehealth: Payer: Self-pay | Admitting: *Deleted

## 2016-12-15 NOTE — Telephone Encounter (Signed)
Nowthen Nevin Bloodgood) states she has already tried a chin strap on the patient and she has not done well with it. Nevin Bloodgood has faxed over a download for patient on her CPAP for you to see and is asking you to please consider the patient to be permanently on an auto pressure of 4-20 cm H20. Nevin Bloodgood states the patient's current unit will not support these pressures so she would like to order patient's CPAP to be switched out to an Airsense 10 Autoset CPAP and remain in Auto Pressure of 4-20, this due to the patient feeling like she is not getting enough pressure. Nevin Bloodgood ask that you please write a RX for Airsense 10 Autoset CPAP Auto 4-20 cm H20 for 99. Nevin Bloodgood states once they get the RX they will contact the patient to bring her current CPAP in that is set on 13 cm H20 and switch it out for an Auto Style.

## 2016-12-15 NOTE — Telephone Encounter (Signed)
-----   Message from Sueanne Margarita, MD sent at 12/10/2016  5:20 PM EDT ----- AHI too high - please add a chin strap and get a 2 week autotitration from 4 to 20cm H2O

## 2016-12-16 DIAGNOSIS — J301 Allergic rhinitis due to pollen: Secondary | ICD-10-CM | POA: Diagnosis not present

## 2016-12-16 DIAGNOSIS — J3089 Other allergic rhinitis: Secondary | ICD-10-CM | POA: Diagnosis not present

## 2016-12-20 ENCOUNTER — Ambulatory Visit (INDEPENDENT_AMBULATORY_CARE_PROVIDER_SITE_OTHER): Payer: Medicare Other | Admitting: Psychology

## 2016-12-20 DIAGNOSIS — F332 Major depressive disorder, recurrent severe without psychotic features: Secondary | ICD-10-CM

## 2016-12-21 DIAGNOSIS — J301 Allergic rhinitis due to pollen: Secondary | ICD-10-CM | POA: Diagnosis not present

## 2016-12-21 DIAGNOSIS — J3089 Other allergic rhinitis: Secondary | ICD-10-CM | POA: Diagnosis not present

## 2016-12-22 ENCOUNTER — Telehealth: Payer: Self-pay | Admitting: *Deleted

## 2016-12-22 DIAGNOSIS — G4733 Obstructive sleep apnea (adult) (pediatric): Secondary | ICD-10-CM

## 2016-12-22 NOTE — Telephone Encounter (Signed)
Per Dr Radford Pax ok to order Airsense 10 auto set CPAP from 4-20 cm H20

## 2016-12-23 DIAGNOSIS — J301 Allergic rhinitis due to pollen: Secondary | ICD-10-CM | POA: Diagnosis not present

## 2016-12-23 DIAGNOSIS — J3089 Other allergic rhinitis: Secondary | ICD-10-CM | POA: Diagnosis not present

## 2016-12-28 ENCOUNTER — Ambulatory Visit (INDEPENDENT_AMBULATORY_CARE_PROVIDER_SITE_OTHER): Payer: Medicare Other | Admitting: *Deleted

## 2016-12-28 ENCOUNTER — Encounter: Payer: Self-pay | Admitting: Internal Medicine

## 2016-12-28 DIAGNOSIS — I495 Sick sinus syndrome: Secondary | ICD-10-CM | POA: Diagnosis not present

## 2016-12-29 ENCOUNTER — Telehealth: Payer: Self-pay | Admitting: *Deleted

## 2016-12-29 NOTE — Progress Notes (Signed)
Remote pacemaker transmission.   

## 2016-12-29 NOTE — Telephone Encounter (Signed)
Notes recorded by Sueanne Margarita, MD on 12/28/2016 at 2:48 PM EDT AHI to high - please get a 2 week autotitration from 4 to 18cm H2o     Last Resulted: 12/14/16 14:03         Per Dr Radford Pax orders have been changed to better care for the patient..   Per Dr Radford Pax ok to order Airsense 10 auto set CPAP from 4-20 cm H20 Informed patient that she will be receiving an auto set unit and she was very pleased and thanked me for calling.

## 2016-12-30 ENCOUNTER — Telehealth (HOSPITAL_COMMUNITY): Payer: Self-pay

## 2016-12-30 DIAGNOSIS — J3089 Other allergic rhinitis: Secondary | ICD-10-CM | POA: Diagnosis not present

## 2016-12-30 DIAGNOSIS — J301 Allergic rhinitis due to pollen: Secondary | ICD-10-CM | POA: Diagnosis not present

## 2016-12-30 NOTE — Telephone Encounter (Signed)
Per Dr Radford Pax orders have been changed to better care for the patient..   Per Dr Radford Pax ok to order Airsense 10 auto set CPAP from 4-20 cm H20 Informed patient that she will be receiving an auto set unit and she was very pleased and thanked me for calling.

## 2016-12-30 NOTE — Telephone Encounter (Signed)
Patient SOB talking on the phone, states this has gone on for some time but slowly getting worse and noticies she has to take more frequent rest breaks getting around with ADLs, chores, errands, etc. Added on tomorrow CHF clinic to see Dr. Claris Gladden NP. Advised if she becomes more symptomatic to call 911 or have someone drive her to ED. Aware and agreeable.  Renee Pain, RN

## 2016-12-31 ENCOUNTER — Encounter (HOSPITAL_COMMUNITY): Payer: Self-pay

## 2016-12-31 ENCOUNTER — Ambulatory Visit (HOSPITAL_COMMUNITY)
Admission: RE | Admit: 2016-12-31 | Discharge: 2016-12-31 | Disposition: A | Discharge status: Home / Self Care | Payer: Medicare Other | Source: Ambulatory Visit | Attending: Internal Medicine | Admitting: Internal Medicine

## 2016-12-31 VITALS — BP 118/72 | HR 76 | Wt 167.5 lb

## 2016-12-31 DIAGNOSIS — Z823 Family history of stroke: Secondary | ICD-10-CM | POA: Insufficient documentation

## 2016-12-31 DIAGNOSIS — R0609 Other forms of dyspnea: Secondary | ICD-10-CM | POA: Insufficient documentation

## 2016-12-31 DIAGNOSIS — K219 Gastro-esophageal reflux disease without esophagitis: Secondary | ICD-10-CM | POA: Insufficient documentation

## 2016-12-31 DIAGNOSIS — M109 Gout, unspecified: Secondary | ICD-10-CM | POA: Insufficient documentation

## 2016-12-31 DIAGNOSIS — F329 Major depressive disorder, single episode, unspecified: Secondary | ICD-10-CM | POA: Diagnosis not present

## 2016-12-31 DIAGNOSIS — K589 Irritable bowel syndrome without diarrhea: Secondary | ICD-10-CM | POA: Insufficient documentation

## 2016-12-31 DIAGNOSIS — G4733 Obstructive sleep apnea (adult) (pediatric): Secondary | ICD-10-CM | POA: Diagnosis not present

## 2016-12-31 DIAGNOSIS — I495 Sick sinus syndrome: Secondary | ICD-10-CM | POA: Insufficient documentation

## 2016-12-31 DIAGNOSIS — Z8249 Family history of ischemic heart disease and other diseases of the circulatory system: Secondary | ICD-10-CM | POA: Insufficient documentation

## 2016-12-31 DIAGNOSIS — R0600 Dyspnea, unspecified: Secondary | ICD-10-CM

## 2016-12-31 DIAGNOSIS — I119 Hypertensive heart disease without heart failure: Secondary | ICD-10-CM | POA: Diagnosis not present

## 2016-12-31 DIAGNOSIS — E039 Hypothyroidism, unspecified: Secondary | ICD-10-CM | POA: Diagnosis not present

## 2016-12-31 DIAGNOSIS — F419 Anxiety disorder, unspecified: Secondary | ICD-10-CM | POA: Diagnosis not present

## 2016-12-31 LAB — BASIC METABOLIC PANEL WITH GFR
Anion gap: 8 (ref 5–15)
BUN: 17 mg/dL (ref 6–20)
CO2: 28 mmol/L (ref 22–32)
Calcium: 9.7 mg/dL (ref 8.9–10.3)
Chloride: 100 mmol/L — ABNORMAL LOW (ref 101–111)
Creatinine, Ser: 1.14 mg/dL — ABNORMAL HIGH (ref 0.44–1.00)
GFR calc Af Amer: 50 mL/min — ABNORMAL LOW
GFR calc non Af Amer: 43 mL/min — ABNORMAL LOW
Glucose, Bld: 74 mg/dL (ref 65–99)
Potassium: 4.2 mmol/L (ref 3.5–5.1)
Sodium: 136 mmol/L (ref 135–145)

## 2016-12-31 NOTE — Progress Notes (Signed)
Patient ID: [name], female   DOB: [birth year], 83 y.o.   MRN: [id number] PCP: Plot[facility] EP: Caryl Comes HF Cardiology: Aundra Dubin  83 y.o. with history of SA nodal dysfunction s/p PPM, HTN, and chronic exertional dyspnea presents for cardiology followup.  She has been seeing Dr Chase Caller with pulmonary and sees Dr Caryl Comes for her pacemaker. Over the last few years, she has noted significant exertional dyspnea that has been progressive.  She is short of breath with steps or any incline.  She is not short of breath walking on flat ground but is short of breath carrying groceries or doing moderate housework.  No chest pain.  No lightheadedness or palpitations.  There was some concern for asthma, but she says that Breo did not help, and she is no longer using it.  She also has prominent fatigue.   Echo in 2/15 showed normal EF with mild pulmonary hypertension (PASP 39 mmHg).  Recent echo in 7/17, however, showed PA systolic pressure 30 mmHg with normal RV size and systolic function and LVEF 55-60%.  PFTs in 3/15 were normal except for a mildly decreased DLCO. She had a high resolution CT of the chest in 5/17 that showed no ILD but PA was dilated suggesting possible pulmonary hypertension.  She was referred to this office for evaluation of possible pulmonary hypertension based on these findings. I tried her on empiric Lasix 20 mg daily, but this did not help her dyspnea and she has now stopped it.   I had her do a Agricultural consultant in 9/17.  This was low risk with no ischemia or infarction. We finally ended up doing a right heart cath in 10/17 that showed normal filling pressures and no pulmonary hypertension.    She returns today for HF follow up. Says she is SOB with walking up the stairs to her apartment. She is SOB at times with talking. Very nervous, trembling. Denies chest pain, palpitations, melena and hematochezia.   Labs (3/17): TSH normal.  Labs (6/17): K 3.9, creatinine 1.01, RF negative, ANA  negative Labs (7/17): K 3.8, creatinine 0.91, BNP 224, HCT 41.1 Labs (9/17): K 4.2, creatinine 0.9, BNP 124 Labs (10/17): K 4.4, creatinine 0.98 Labs (11/17): TSH normal Labs (3/18): hgb 12.8 Labs (5/18): K 4.3, creatinine 0.98  PMH: 1. Gout 2. Depression 3. Hypothyroidism 4. SA node dysfunction with Medtronic PPM. 5. Stress echo (2012) normal. 6. GERD 7. IBS 8. HTN 9. Exertional dyspnea:  - PFTs (3/15) normal except for slightly decreased DLCO - Echo (2/15) with EF 65-70%, mild MR, PASP 39 mmHg.  - High resolution CT chest (5/17) with no evidence for IDL, PA enlarged concerning for pulmonary hypertension. - Possible asthma - Echo (7/17): EF 55-60%, mild MR with mitral valve prolapse, moderate TR, normal RV size and systolic function, PASP 30 mmHg.  - Lexiscan Cardiolite (9/17) with EF 55%, no ischemia/infarction - RHC (10/17) with mean RA 6, PA 28/9, mean PCWP 7, CI 2.37 10. Ascending aorta dilation: 4.0 cm on CT chest 5/17.  11. OSA: Using CPAP.  12. Diverticulitis 3/18  SH: Lives with son, nonsmoker, no ETOH.  FH: CVAs, father with MI, multiple siblings with coronary disease (has > 10 siblings).   ROS: All systems reviewed and negative except as per HPI.   Current Outpatient Prescriptions  Medication Sig Dispense Refill  . allopurinol (ZYLOPRIM) 300 MG tablet Take 1 tablet (300 mg total) by mouth daily. 30 tablet 5  . cetirizine (ZYRTEC) 10 MG  tablet Take 10 mg by mouth daily as needed for allergies.    . clotrimazole (LOTRIMIN) 1 % cream Apply 1 application topically 2 (two) times daily.    [name] escitalopram (LEXAPRO) 10 MG tablet TAKE 1 TABLET (10 MG) BY MOUTH DAILY AT BEDTIME. 90 tablet 0  . feeding supplement, ENSURE ENLIVE, (ENSURE ENLIVE) LIQD Take 237 mLs by mouth 2 (two) times daily between meals. 237 mL 12  . fluocinonide cream (LIDEX) 3.29 % Apply 1 application topically daily as needed (For spots on face/itching.).     [name] KLOR-CON M20 20 MEQ tablet TAKE 1 TABLET  BY MOUTH EVERY DAY 30 tablet 3  . levothyroxine (SYNTHROID, LEVOTHROID) 100 MCG tablet TAKE 1 TABLET (100 MCG TOTAL) BY MOUTH DAILY. 90 tablet 3  . LORazepam (ATIVAN) 0.5 MG tablet Take 1 tab daily and 2nd as needed 45 tablet 1  . Magnesium Oxide 200 MG TABS Take 1 tablet (200 mg total) by mouth daily. 30 tablet 6  . oxybutynin (DITROPAN) 5 MG tablet TAKE 1 TABLET (5 MG TOTAL) BY MOUTH DAILY AS NEEDED. 30 tablet 3  . psyllium (METAMUCIL SMOOTH TEXTURE) 28 % packet Take 1 packet by mouth daily with breakfast.    . ranitidine (ZANTAC) 150 MG tablet Take 150 mg by mouth 3 (three) times daily.    [name] spironolactone (ALDACTONE) 25 MG tablet TAKE 1 TABLET (25 MG TOTAL) BY MOUTH DAILY. 30 tablet 3   No current facility-administered medications for this encounter.    BP 118/72   Pulse 76   Wt 167 lb 8 oz (76 kg)   SpO2 99%   BMI 28.75 kg/m  General: Elderly, well appearing. No resp difficulty. HEENT: Normal Neck: Supple. JVP flat. Carotids 2+ bilat; no bruits. No thyromegaly or nodule noted. Cor: PMI nondisplaced. RRR, No M/G/R noted Lungs: CTAB, normal effort. Abdomen: Soft, non-tender, non-distended, no HSM. No bruits or masses. +BS  Extremities: No cyanosis, clubbing, rash, R and LLE no edema.  Neuro: Alert & orientedx3, cranial nerves grossly intact. moves all 4 extremities w/o difficulty. Affect pleasant   Assessment/Plan: 1. Exertional dyspnea:  This has been progressive over the last two years, uncertain etiology.  There was some concern for asthma in the past but Breo inhaler did not help.  Echo in 2/15 showed normal EF with mildly elevated PA systolic pressure. However, more recent echo in 7/17 showed normal EF and RV function with no pulmonary hypertension.  PFTs in 3/15 were normal except for low DLCO, which could be indicative of pulmonary vascular disease.  High resolution chest CT in 5/17 did not show interstitial lung disease but did show an enlarged PA, concerning for possible  pulmonary hypertension.  Lexiscan Cardiolite in 9/17 showed no ischemia or infarction. She has tried lasix in the past without improvement.   - Suspect her symptoms are related to deconditioning and anxiety. She is not SOB when she talks about her life as it used to be, but gets anxious and SOB when she talks about her life now and is worried that someone will have to take care of her.  - Discussed with Dr. Haroldine Laws who suggested a CPX testing to compare functional capacity to others her age.   2. Dilated ascending aorta:  - 4.0 in 08/2015. Needs repeat CT.   3. SA nodal dysfunction:  - s/p Medtronic PPM. Follows with Dr. Caryl Comes.   4. OSA: - compliant with CPAP.   5. Anxiety - Follows with psychology. Continue Lexapro.  Arbutus Leas 12/31/2016  Patient seen and examined with Jettie Booze, NP. We discussed all aspects of the encounter. I agree with the assessment and plan as stated above.   Previous echo, CT, PFTs and RHC data/images reviewed. TO date work-up for exertional dyspnea remains essentially normal. In talking to her she seems to be quite functional for an 83 y/o. She is independent and can go to the store and says she can even carry the groceries up her steps. I suspect the majority of her dyspnea is related to anxiety/depression and the fact that she is comparing herself to what she could do 10 years ago and not to her peers. We discussed this at length. She is interested in joining [employer] and I think this would be a great idea.   On echo she does have evidence of a restrictive filling pattern which is not unusual for her age and may be contributing to her exertional dyspnea. Will proceed with CPX testing to further assess.   Total time spent 45 minutes. Over half that time spent discussing above.    Glori Bickers, MD  8:42 PM

## 2016-12-31 NOTE — Progress Notes (Signed)
Advanced Heart Failure Medication Review by a Pharmacist  Does the patient  feel that his/her medications are working for him/her?  yes  Has the patient been experiencing any side effects to the medications prescribed?  no  Does the patient measure his/her own blood pressure or blood glucose at home?  yes   Does the patient have any problems obtaining medications due to transportation or finances?   no  Understanding of regimen: good Understanding of indications: good Potential of compliance: good Patient understands to avoid NSAIDs. Patient understands to avoid decongestants.  Issues to address at subsequent visits: None   Pharmacist comments: [name] is a pleasant 83 yo F presenting without a medication list but with good recall of her regimen. She reports good compliance with her regimen but states that her weight has steadily increased ~5 lbs in the last month. No other medication-related questions or concerns for me at this time.   Ruta Hinds. Velva Harman, PharmD, BCPS, CPP Clinical Pharmacist Pager: [phone] Phone: [phone] 12/31/2016 11:29 AM      Time with patient: 10 minutes Preparation and documentation time: 2 minutes Total time: 12 minutes

## 2016-12-31 NOTE — Patient Instructions (Signed)
Routine lab work today. Will notify you of abnormal results, otherwise no news is good news!  Will schedule you for Cardiopulmonary Exercise Test. This test is done at our [facility]. Please wear comfortable clothes and shoes for this test. Avoid heavy meal before the test (light snack/meal recommended). Avoid caffeine, alcohol, tobacco products 12 hrs before test. Please give 24 hr notice for cancellations/rescheduling: [phone].   _______________________________________________________________  _______________________________________________________________  Follow up with Dr. Aundra Dubin in 3 months.  _______________________________________________________________  _______________________________________________________________  Take all medication as prescribed the day of your appointment. Bring all medications with you to your appointment.  Do the following things EVERYDAY: 1) Weigh yourself in the morning before breakfast. Write it down and keep it in a log. 2) Take your medicines as prescribed 3) Eat low salt foods-Limit salt (sodium) to 2000 mg per day.  4) Stay as active as you can everyday 5) Limit all fluids for the day to less than 2 liters

## 2017-01-04 ENCOUNTER — Ambulatory Visit: Payer: Self-pay | Admitting: Internal Medicine

## 2017-01-05 ENCOUNTER — Encounter: Payer: Self-pay | Admitting: Cardiology

## 2017-01-06 ENCOUNTER — Encounter (HOSPITAL_COMMUNITY): Payer: Self-pay | Admitting: Cardiology

## 2017-01-06 LAB — CUP PACEART REMOTE DEVICE CHECK
Battery Impedance: 2906 Ohm
Battery Remaining Longevity: 21 mo
Brady Statistic AP VP Percent: 7 %
Brady Statistic AS VS Percent: 7 %
Implantable Lead Location: 753859
Implantable Lead Location: 753860
Implantable Lead Model: 5076
Implantable Lead Model: 5076
Implantable Pulse Generator Implant Date: 20090527
Lead Channel Impedance Value: 531 Ohm
Lead Channel Pacing Threshold Amplitude: 0.625 V
Lead Channel Pacing Threshold Pulse Width: 0.4 ms
Lead Channel Sensing Intrinsic Amplitude: 5.6 mV
Lead Channel Setting Pacing Amplitude: 2.5 V
Lead Channel Setting Pacing Pulse Width: 0.4 ms
MDC IDC LEAD IMPLANT DT: 20090527
MDC IDC LEAD IMPLANT DT: 20090527
MDC IDC MSMT BATTERY VOLTAGE: 2.72 V
MDC IDC MSMT LEADCHNL RV IMPEDANCE VALUE: 533 Ohm
MDC IDC MSMT LEADCHNL RV PACING THRESHOLD AMPLITUDE: 0.5 V
MDC IDC MSMT LEADCHNL RV PACING THRESHOLD PULSEWIDTH: 0.4 ms
MDC IDC SESS DTM: 20180904134549
MDC IDC SET LEADCHNL RA PACING AMPLITUDE: 2 V
MDC IDC SET LEADCHNL RV SENSING SENSITIVITY: 2 mV
MDC IDC STAT BRADY AP VS PERCENT: 85 %
MDC IDC STAT BRADY AS VP PERCENT: 1 %

## 2017-01-11 ENCOUNTER — Encounter (HOSPITAL_COMMUNITY): Payer: Self-pay

## 2017-01-13 ENCOUNTER — Other Ambulatory Visit (HOSPITAL_COMMUNITY): Payer: Self-pay | Admitting: *Deleted

## 2017-01-13 ENCOUNTER — Ambulatory Visit (HOSPITAL_COMMUNITY): Payer: Medicare Other | Attending: Internal Medicine

## 2017-01-13 DIAGNOSIS — I119 Hypertensive heart disease without heart failure: Secondary | ICD-10-CM | POA: Diagnosis not present

## 2017-01-13 DIAGNOSIS — R0600 Dyspnea, unspecified: Secondary | ICD-10-CM | POA: Diagnosis not present

## 2017-01-13 MED ORDER — MAGNESIUM OXIDE -MG SUPPLEMENT 200 MG PO TABS
200.0000 mg | ORAL_TABLET | Freq: Every day | ORAL | 6 refills | Status: DC
Start: 2017-01-13 — End: 2017-06-15

## 2017-01-17 ENCOUNTER — Ambulatory Visit (INDEPENDENT_AMBULATORY_CARE_PROVIDER_SITE_OTHER): Payer: Medicare Other | Admitting: Cardiology

## 2017-01-17 ENCOUNTER — Encounter: Payer: Self-pay | Admitting: Cardiology

## 2017-01-17 ENCOUNTER — Ambulatory Visit (INDEPENDENT_AMBULATORY_CARE_PROVIDER_SITE_OTHER): Payer: Medicare Other | Admitting: Psychology

## 2017-01-17 VITALS — BP 132/86 | HR 83 | Ht 64.0 in | Wt 170.0 lb

## 2017-01-17 DIAGNOSIS — G4733 Obstructive sleep apnea (adult) (pediatric): Secondary | ICD-10-CM

## 2017-01-17 DIAGNOSIS — I10 Essential (primary) hypertension: Secondary | ICD-10-CM | POA: Diagnosis not present

## 2017-01-17 DIAGNOSIS — I2510 Atherosclerotic heart disease of native coronary artery without angina pectoris: Secondary | ICD-10-CM

## 2017-01-17 DIAGNOSIS — F332 Major depressive disorder, recurrent severe without psychotic features: Secondary | ICD-10-CM

## 2017-01-17 NOTE — Progress Notes (Signed)
Cardiology Office Note:    Date:  01/17/2017   ID:  [name], DOB April 05, 1930, MRN [id number]  PCP:  [name] Anger, MD  Cardiologist:  [name] Him, MD   Referring MD: [name] Anger, MD   Chief Complaint  Patient presents with  . Sleep Apnea  . Hypertension    History of Present Illness:    [name] is a 83 y.o. female with a hx of mild OSA with an AHI of 12/hr on PSG with no significant oxygen desaturations but loud snoring noted.  She is on CPAP at 12cm H2o.  She is doing well with her CPAP device and thinks that she has gotten used to it.  She tolerates the nasal mask and feels the pressure is adequate but wakes up in the middle of the night with her mouth being very dry and her tongue is coated with a thick lining.  She is not using her chin strap that she has.  She continues to feel rested in the am is she slept well the night before.  She has a lot of problems with her allergies and nasal congestion.    Past Medical History:  Diagnosis Date  . Anxiety   . Bradycardia   . Breast cancer, left breast ([place]) 1995  . Breast cancer, right breast ([place]) 2002  . CHF (congestive heart failure) ([place])   . Depression    Dr [name]  . Discoid lupus    skin  . Full dentures   . GERD (gastroesophageal reflux disease)   . Glaucoma   . Gout   . Hx of echocardiogram    a.  Echocardiogram (03/2011): Mild LVH, EF 85-02%, grade 1 diastolic dysfunction, mild MR, mild to moderate TR, PASP 42;  b. Echo (05/2013):  Mod LVH, EF 65-70%, no WMA, Gr 1 DD, mild MR, mod TR, PASP 39  . Hx of exercise stress test    a. ETT-echocardiogram (06/08/11): Sub-optimal exercise. Test stopped early due to dizziness and hypotension. EF 60%.  Non-diagnostic.;   b. Lexiscan Myoview (06/2013):  Diaphragmatic attenuation, no ischemia, EF 61%.  Low Risk  . Hypertension   . Hypothyroidism   . IBS (irritable bowel syndrome)   . IBS (irritable bowel syndrome)   . Incontinence   . OSA (obstructive  sleep apnea) 07/20/2016   Mild with AHI 12/hr now on CPAP  . OSA on CPAP    "quit wearing mask; retested; not enough information; to be tested again 04/08/2016"  . Osteoarthritis   . Osteopenia   . Pacemaker 2009   Brady//Chronotropic incompetence with normal pacemaker function  . Thyroid disease   . Wears hearing aid    both ears    Past Surgical History:  Procedure Laterality Date  . BREAST BIOPSY Left 1995  . BREAST LUMPECTOMY Left 1995   axillary node dissectoin  . CARDIAC CATHETERIZATION N/A 02/13/2016   Procedure: Right Heart Cath;  Surgeon: [name] Dresser, MD;  Location: [facility];  Service: Cardiovascular;  Laterality: N/A;  . CATARACT EXTRACTION W/ INTRAOCULAR LENS  IMPLANT, BILATERAL Bilateral 06/2009 - 12.2915   left - right  . DORSAL COMPARTMENT RELEASE  2000   left  . HAMMER TOE SURGERY Left 2004  . HEMORRHOID BANDING    . INSERT / REPLACE / REMOVE PACEMAKER  2009  . KNEE ARTHROSCOPY Left 08/29/2012   Procedure: LEFT KNEE ARTHROSCOPY ;  Surgeon: [name] Dibble, MD;  Location: [facility];  Service: Orthopedics;  Laterality: Left;  [name] MASTECTOMY Bilateral 09/2000   nbx  . TRIGGER FINGER RELEASE Right 07/31/2013   Procedure: EXCISE MASS RIGHT INDEX A-1 PLLLEY RELEASE A-1 RIGHT INDEX;  Surgeon: [name]., MD;  Location: [facility];  Service: Orthopedics;  Laterality: Right;  [name] VAGINAL HYSTERECTOMY      Current Medications: Current Meds  Medication Sig  . allopurinol (ZYLOPRIM) 300 MG tablet Take 1 tablet (300 mg total) by mouth daily.  . cetirizine (ZYRTEC) 10 MG tablet Take 10 mg by mouth daily as needed for allergies.  [name] escitalopram (LEXAPRO) 10 MG tablet TAKE 1 TABLET (10 MG) BY MOUTH DAILY AT BEDTIME.  . feeding supplement, ENSURE ENLIVE, (ENSURE ENLIVE) LIQD Take 237 mLs by mouth 2 (two) times daily between meals.  . fluocinonide cream (LIDEX) 0.62 % Apply 1 application topically daily as needed (For spots on  face/itching.).   [name] KLOR-CON M20 20 MEQ tablet TAKE 1 TABLET BY MOUTH EVERY DAY  . levothyroxine (SYNTHROID, LEVOTHROID) 100 MCG tablet TAKE 1 TABLET (100 MCG TOTAL) BY MOUTH DAILY.  [name] LORazepam (ATIVAN) 0.5 MG tablet Take 1 tab daily and 2nd as needed  . Magnesium Oxide 200 MG TABS Take 1 tablet (200 mg total) by mouth daily.  [name] oxybutynin (DITROPAN) 5 MG tablet TAKE 1 TABLET (5 MG TOTAL) BY MOUTH DAILY AS NEEDED.  [name] psyllium (METAMUCIL SMOOTH TEXTURE) 28 % packet Take 1 packet by mouth daily with breakfast.  . ranitidine (ZANTAC) 150 MG tablet Take 150 mg by mouth 3 (three) times daily.  [name] spironolactone (ALDACTONE) 25 MG tablet TAKE 1 TABLET (25 MG TOTAL) BY MOUTH DAILY.     Allergies:   Lactase; Amlodipine besy-benazepril hcl; Amlodipine besylate; Clonidine hydrochloride; Lactose intolerance (gi); Valsartan; and Verapamil   Social History   Social History  . Marital status: Widowed    Spouse name: N/A  . Number of children: N/A  . Years of education: N/A   Occupational History  . retired    Social History Main Topics  . Smoking status: Never Smoker  . Smokeless tobacco: Never Used  . Alcohol use No  . Drug use: No  . Sexual activity: No   Other Topics Concern  . None   Social History Narrative   She is a vegan now 2010     Family History: The patient's family history includes Cancer in her brother; Heart disease in her brother and father; Ovarian cancer in her mother; Stroke in her brother and sister.  ROS:   Please see the history of present illness.    ROS  All other systems reviewed and negative.   EKGs/Labs/Other Studies Reviewed:    The following studies were reviewed today: CPAP download  EKG:  EKG is not ordered today.    Recent Labs: 03/22/2016: B Natriuretic Peptide 81.9 08/06/2016: ALT 11; Hemoglobin 13.0; Platelets 197.0 08/27/2016: Magnesium 1.9 10/13/2016: TSH 4.45 12/31/2016: BUN 17; Creatinine, Ser 1.14; Potassium 4.2; Sodium 136   Recent Lipid  Panel    Component Value Date/Time   CHOL 174 05/11/2010 1229   TRIG 78.0 05/11/2010 1229   HDL 52.30 05/11/2010 1229   CHOLHDL 3 05/11/2010 1229   VLDL 15.6 05/11/2010 1229   LDLCALC 106 (H) 05/11/2010 1229   LDLDIRECT 130.8 09/30/2006 1047    Physical Exam:    VS:  BP 132/86   Pulse 83   Ht 5\' 4"  (1.626 m)   Wt 170 lb (77.1 kg)   BMI 29.18 kg/m  Wt Readings from Last 3 Encounters:  01/17/17 170 lb (77.1 kg)  12/31/16 167 lb 8 oz (76 kg)  11/08/16 165 lb (74.8 kg)     GEN:  Well nourished, well developed in no acute distress HEENT: Normal NECK: No JVD; No carotid bruits LYMPHATICS: No lymphadenopathy CARDIAC: RRR, no murmurs, rubs, gallops RESPIRATORY:  Clear to auscultation without rales, wheezing or rhonchi  ABDOMEN: Soft, non-tender, non-distended MUSCULOSKELETAL:  No edema; No deformity  SKIN: Warm and dry NEUROLOGIC:  Alert and oriented x 3 PSYCHIATRIC:  Normal affect   ASSESSMENT:    1. OSA (obstructive sleep apnea)   2. Benign essential HTN    PLAN:    In order of problems listed above:  1.  OSA - the patient is tolerating PAP therapy well without any problems. The PAP download was reviewed today and showed an AHI of 13.9/hr on 13 cm H2O with 90% compliance in using more than 4 hours nightly.  The patient has been using and benefiting from CPAP use and will continue to benefit from therapy. Her AHI is elevated which I suspect is from mouth breathing.  I have asked her to start using her chin strap and I will get a download in 2 weeks.  In regards to her mouth dryness, I suspect that this should improve with the chin strap.  I have recommended Flonase for nasal congestion and nasal saline spray.  She will call in a few weeks and let me know how she is doing.    2.  HTN - her BP is well controlled on exam today.  She will continue on aldactone 25mg  daily.       Medication Adjustments/Labs and Tests Ordered: Current medicines are reviewed at length with  the patient today.  Concerns regarding medicines are outlined above.  No orders of the defined types were placed in this encounter.  No orders of the defined types were placed in this encounter.   Signed, [name] Him, MD  01/17/2017 2:39 PM    [place]

## 2017-01-17 NOTE — Patient Instructions (Signed)
Medication Instructions:  Your physician recommends that you continue on your current medications as directed. Please refer to the Current Medication list given to you today.   You may use:  Nasal saline spray 2 sprays each nostril twice a day  Flonase Nasal Spray OTC 1 spray each nostril daily  Labwork: None ordered  Testing/Procedures: None ordered  Follow-Up: Your physician wants you to follow-up in: 1 year with Dr. Radford Pax. You will receive a reminder letter in the mail two months in advance. If you don't receive a letter, please call our office to schedule the follow-up appointment.   Any Other Special Instructions Will Be Listed Below (If Applicable).     If you need a refill on your cardiac medications before your next appointment, please call your pharmacy.

## 2017-01-21 ENCOUNTER — Telehealth: Payer: Self-pay | Admitting: Cardiology

## 2017-01-21 NOTE — Telephone Encounter (Signed)
Advised pt to contact physician following her cataracts to discuss. She is agreeable and thanks me for letting her know.

## 2017-01-21 NOTE — Telephone Encounter (Signed)
[name] is calling because she prescribed her Flonase and she is wanting to make sure that she can take it , because it says if she has cataracts ,and glaucoma to ask the doctor before use . Please call   thanks

## 2017-01-26 DIAGNOSIS — J301 Allergic rhinitis due to pollen: Secondary | ICD-10-CM | POA: Diagnosis not present

## 2017-01-26 DIAGNOSIS — J3089 Other allergic rhinitis: Secondary | ICD-10-CM | POA: Diagnosis not present

## 2017-01-28 DIAGNOSIS — J301 Allergic rhinitis due to pollen: Secondary | ICD-10-CM | POA: Diagnosis not present

## 2017-01-28 DIAGNOSIS — J3089 Other allergic rhinitis: Secondary | ICD-10-CM | POA: Diagnosis not present

## 2017-01-31 ENCOUNTER — Ambulatory Visit (INDEPENDENT_AMBULATORY_CARE_PROVIDER_SITE_OTHER): Payer: Medicare Other | Admitting: Psychology

## 2017-01-31 DIAGNOSIS — F332 Major depressive disorder, recurrent severe without psychotic features: Secondary | ICD-10-CM

## 2017-02-01 ENCOUNTER — Ambulatory Visit (HOSPITAL_COMMUNITY): Payer: Medicare Other | Admitting: Psychiatry

## 2017-02-04 ENCOUNTER — Telehealth: Payer: Self-pay | Admitting: Internal Medicine

## 2017-02-04 NOTE — Telephone Encounter (Signed)
Tried to call pt due to needing to reschedule her appt with MR due to MR needing to leave early on 02/11/17. Left message for pt to call us back so we could reschedule her appt.

## 2017-02-09 ENCOUNTER — Ambulatory Visit: Payer: Self-pay | Admitting: Internal Medicine

## 2017-02-11 ENCOUNTER — Ambulatory Visit: Payer: Self-pay | Admitting: Internal Medicine

## 2017-02-14 ENCOUNTER — Ambulatory Visit: Payer: Self-pay | Admitting: Psychology

## 2017-02-14 ENCOUNTER — Ambulatory Visit (HOSPITAL_COMMUNITY): Payer: Medicare Other | Admitting: Psychiatry

## 2017-02-15 ENCOUNTER — Ambulatory Visit: Payer: Self-pay | Admitting: Internal Medicine

## 2017-02-15 DIAGNOSIS — J301 Allergic rhinitis due to pollen: Secondary | ICD-10-CM | POA: Diagnosis not present

## 2017-02-15 DIAGNOSIS — J302 Other seasonal allergic rhinitis: Secondary | ICD-10-CM | POA: Diagnosis not present

## 2017-02-16 ENCOUNTER — Encounter: Payer: Self-pay | Admitting: Internal Medicine

## 2017-02-16 ENCOUNTER — Ambulatory Visit (INDEPENDENT_AMBULATORY_CARE_PROVIDER_SITE_OTHER)
Admission: RE | Admit: 2017-02-16 | Discharge: 2017-02-16 | Disposition: A | Discharge status: Home / Self Care | Payer: Medicare Other | Source: Ambulatory Visit | Attending: Internal Medicine | Admitting: Internal Medicine

## 2017-02-16 ENCOUNTER — Ambulatory Visit (INDEPENDENT_AMBULATORY_CARE_PROVIDER_SITE_OTHER): Payer: Medicare Other | Admitting: Internal Medicine

## 2017-02-16 VITALS — BP 108/64 | HR 71 | Ht 64.0 in | Wt 171.0 lb

## 2017-02-16 DIAGNOSIS — R042 Hemoptysis: Secondary | ICD-10-CM

## 2017-02-16 DIAGNOSIS — R0602 Shortness of breath: Secondary | ICD-10-CM

## 2017-02-16 DIAGNOSIS — I2510 Atherosclerotic heart disease of native coronary artery without angina pectoris: Secondary | ICD-10-CM | POA: Diagnosis not present

## 2017-02-16 DIAGNOSIS — R05 Cough: Secondary | ICD-10-CM | POA: Diagnosis not present

## 2017-02-16 DIAGNOSIS — R053 Chronic cough: Secondary | ICD-10-CM

## 2017-02-16 NOTE — Patient Instructions (Signed)
Hemoptysis   - probably just from cough - CT chest 2017 was clear - be on safe side do CXR 2 view today  Shortness of breath - unclear if due to asthma but respect you do not want to go back to breo - probably related to weight and physical deconditioning - agree we will keep an eye on it  Chronic cough - glad is not a problem now  Followup 9 month or sooner if neeed

## 2017-02-16 NOTE — Progress Notes (Signed)
Subjective:     Patient ID: [name], female   DOB: 07/09/1929, 83 y.o.   MRN: [id number]  HPI  HPI  PCP Walker Kehr, MD   HPI  Brief patient profile:  38 yobf never smoker with h/o sneezing/itching/coughing  eval 1979 and placed on allergy shots ever since but nose runs year round  if doesn't  take zyrtec developed new unexplained doe 2014 self referred to pulmonary clinic 06/20/2013 with nl  pfts 07/02/13     History of Present Illness  06/20/2013 1st [facility] office visit/ Wert cc progressive doe x one year (chart suggests dates back to 2012 however) indolent onset to point where can't do steps at [place], can't finish silver sneakers, can't walk a mile, can do grocery shopping but has to rest before going across the parking lot.  Hb during this time worse than usual on aciphex ac and pc rec Stop visken and start bystolic 5 mg one twice daily  Continue aciphex 20 mg Take 30- 60 min before your first and last meals of the day    07/02/2013 f/u ov/Wert re: unexplained sob Chief Complaint  Patient presents with  . Follow-up    Pt reports her breathing is unchanged since her last visit. She c/o HA since she started taking bystolic.   ha is random, transient, not related to time of day or activity, generalized, s nausea of neuro/viz cc's rec Finish your cardiac evaluation first, then if not doing better call [id number] for CPST with spirometry before and after > neg myoview, was not physically stressed at any point Zebeta 5 mg reduce to  one half tablet daily  Take only the medications on your list as they are on your after visit summary  If heart burn acts up, ok to resume aciphex 20 mg  > it did flare so she continue aciphe bid    08/07/2013 f/u ov/Wert re: unexplained doe, takes various alternative rx/ "nothing worksAdvertising account planner Complaint  Patient presents with  . Follow-up    Breathing is unchanged since last visit. She states that Cardiology ruled out any heart  problems and advised her to f/u here.    no better or worse doe since onset, never occurs at rest or sleeping, still can't do a grocery store s stopping to rest  rec Continue on current regimen   Follow med calendar closely and bring to each visit.   08/23/2013 followup and medication review. Patient returns for a followup visit and medication review. Reviewed all her medications and organized them into a medication calendar. Patient education. Appears the patient is taking her medication currently. Says her dyspnea is unchanged since last ov; no new complaints. rec Continue on current regimen  Labs today.  Follow med calendar closely and bring to each visit   09/20/2013 f/u ov/Wert re: sob x much worse since  first of the year/ no med calendar Chief Complaint  Patient presents with  . Shortness of Breath    Breathing is unchanged. Still reports DOE. Denies chest tightness or coughing.  no resting or noct symptoms - doe not really worse since onset   No obvious day to day or daytime variabilty or assoc chronic cough or cp or chest tightness, subjective wheeze overt sinus or hb symptoms. No unusual exp hx or h/o childhood pna/ asthma or knowledge of premature birth.  Sleeping ok without nocturnal  or early am exacerbation  of respiratory  c/o's or need for noct saba. Also denies  any obvious fluctuation of symptoms with weather or environmental changes or other aggravating or alleviating factors except as outlined above OV 09/04/2015      Chief Complaint  Patient presents with  . Pulmonary Consult    Pt referred by Dr. Harold Hedge. Pt c/o blood tinged sputum when waking in morning x 1 year. Pt c/o intermittent DOE. Pt denies CP/tightness.        Referred by Dr. Fredderick Phenix for hemoptysis. Patient herself is a poor historian. She was last seen by Dr. Melvyn Novas in our office in May 2015 and discharged from follow-up. As best as I can gather in reviewing outside chart, old chart and talking  to the patient she seems to have a chronic cough for the last 1 year. She says it is mild but on RSI cough score below it is obviously a lot more severe in symptoms with a score of 29. She says early in the morning when she gets up she notices a mild amount of hemoptysis. Although when I questioned her if she is coughing it up she says it comes from her throat. Tablet every other day. This been no weight loss since then. This no shortness of breath or wheezing. She follows at the [facility] for 1 or 2 decades. She's been on allergy shots to a few years ago. And then that was stopped. She is on follow-up with antihistamines but review of the records. She denies any shortness of breath. She is a nonsmoker. She does complain of scratchy throat and postnasal drip. She's never seen a ENT physician. She seems to think all her cough is due to acid reflux  no matter what I tried to explain   Exhaled nitric oxide in our office today 09/04/2015 - slightly elevated at 41 ppb; denies having been on any asthma inhalers for many years  She is unsure if she wants to do testing for hemoptysis ".doc do what you  need to do"  Prior imaging reviewed and visualized 04/16/2011 she had CT angiogram chest that ruled out pulmonary embolism. Pulmonary parenchyma was clear of infiltrates most recently she had chest x-ray 02/13/2015 that is clear of any infiltrates. She had full pulmonary function test March 2015 that I personally visualized and looks normal except for isolated low diffusion capacity of 61%. She had echocardiogram February 2015 that shows mild diastolic dysfunction she's not on any anticoagulants. According   OV 09/30/2015  Chief Complaint  Patient presents with  . Follow-up    pt states her SOB has improved, no more hemoptysis but does note a prod cough with yellow/white mucus.  Denies CP, fever, chest tightness.     Follow-up hemoptysis - is as never recurred after the first time. CT chest is clear  CT sinuses clear. She prefers not to have bronchoscopy/  reports that she has never smoked. She has never used smokeless tobacco.   Follow-up chronic cough presumably due to asthma based on exhaled nitric oxide testing last visit: Now on Brio and she is significantly better. Off and sputum production are all improved. She is only taking Brio 3 times a week because she forgets.  Other evaluation: She has dyspnea. CT chest showed pulmonary hypertension. Is also significant echocardiogram 2015. I made a referral to Dr. Aundra Dubin but she is able to see him I do not see any appointment made for him.   OV 04/02/2016  Chief Complaint  Patient presents with  . Follow-up    Pt c/o prod cough  with yellow mucus - pt was given abx by allergist and pt states the abx are helping. Pt deneis hemoptysis and CP/tightness.    Patient is a very poor historian. She confuses history. Last seen June 2017  - Hemoptysis: At the time of my visit in June 2017 she said she has not had any hemoptysis. Today she tells me that she has not had hemoptysis in the last few months. However she is unable to ascertain if she has been hemoptysis free since June 2017.    Evaluation for pulmonary arterial hypertension based on CT chest: Right heart pressures aon cardiac catheterization 02/13/2016 by Dr. [name] a normal period this was reviewed by myself  Chronic cough labeled as mild persistent asthma due to borderline high nitric oxide and responds to [place] in the summer 2017: She tells me that she is not taking the Brio anymore because someone told her she does not have asthma. She also tells me that the cough is present now and is mild to moderate in intensity with some element mucus she is unable to tell if it is worse than baseline or not. My sense is that is only mild-moderate in intensity.   OV 02/16/2017  Chief Complaint  Patient presents with  . Follow-up    Pt c/o being out of breath all the time and occ. cough. Pt  states that within the past month, pt coughed up a small amount of blood but none since then. Denies any CP..   83 year old female . He is an extremely poor historian. In the past she had hemoptysis. This time she see me after almost 9 months and she tells me in the last 1 month she's had2 very small episodes of hemoptysis very small flecks of blood during routine cough. But at the same time she tells me that she does not have chronic cough. She is not taking her Brio anymore but then she complains about the cough in the shortness of breath. She does not want to take any inhalers at all side effects. She did see the allergist yesterday. Overall she feels things are stable she declined a flu shot  Dr Lorenza Cambridge Reflux Symptom Index (> 13-15 suggestive of LPR cough) 0 -> 5  =  none ->severe problem 09/04/2015   Hoarseness of problem with voice 1  Clearing  Of Throat 2  Excess throat mucus or feeling of post nasal drip 5  Difficulty swallowing food, liquid or tablets 0  Cough after eating or lying down 5  Breathing difficulties or choking episodes 4  Troublesome or annoying cough 3  Sensation of something sticking in throat or lump in throat 4  Heartburn, chest pain, indigestion, or stomach acid coming up 5  TOTAL 29    Results for [name], [name] (MRN [id number]) as of 02/16/2017 12:16  Ref. Range 09/04/2015 11:00  Nitric Oxide Unknown 41     has a past medical history of Anxiety; Bradycardia; Breast cancer, left breast ([place]) (1995); Breast cancer, right breast ([place]) (2002); CHF (congestive heart failure) ([place]); Depression; Discoid lupus; Full dentures; GERD (gastroesophageal reflux disease); Glaucoma; Gout; echocardiogram; exercise stress test; Hypertension; Hypothyroidism; IBS (irritable bowel syndrome); IBS (irritable bowel syndrome); Incontinence; OSA (obstructive sleep apnea) (07/20/2016); OSA on CPAP; Osteoarthritis; Osteopenia; Pacemaker (2009); Thyroid disease; and Wears hearing aid.   reports  that she has never smoked. She has never used smokeless tobacco.  Past Surgical History:  Procedure Laterality Date  . BREAST BIOPSY Left 1995  . BREAST LUMPECTOMY  Left 1995   axillary node dissectoin  . CARDIAC CATHETERIZATION N/A 02/13/2016   Procedure: Right Heart Cath;  Surgeon: Larey Dresser, MD;  Location: [facility];  Service: Cardiovascular;  Laterality: N/A;  . CATARACT EXTRACTION W/ INTRAOCULAR LENS  IMPLANT, BILATERAL Bilateral 06/2009 - 12.2915   left - right  . DORSAL COMPARTMENT RELEASE  2000   left  . HAMMER TOE SURGERY Left 2004  . HEMORRHOID BANDING    . INSERT / REPLACE / REMOVE PACEMAKER  2009  . KNEE ARTHROSCOPY Left 08/29/2012   Procedure: LEFT KNEE ARTHROSCOPY ;  Surgeon: Hessie Dibble, MD;  Location: [facility];  Service: Orthopedics;  Laterality: Left;  [name] MASTECTOMY Bilateral 09/2000   nbx  . TRIGGER FINGER RELEASE Right 07/31/2013   Procedure: EXCISE MASS RIGHT INDEX A-1 PLLLEY RELEASE A-1 RIGHT INDEX;  Surgeon: Cammie Sickle., MD;  Location: [facility];  Service: Orthopedics;  Laterality: Right;  [name] VAGINAL HYSTERECTOMY      Allergies  Allergen Reactions  . Lactase Diarrhea  . Amlodipine Besy-Benazepril Hcl Swelling  . Amlodipine Besylate Swelling  . Clonidine Hydrochloride     REACTION: tired  . Lactose Intolerance (Gi) Other (See Comments)  . Valsartan Other (See Comments)    Tongue swelling   . Verapamil     There is no immunization history for the selected administration types on file for this patient.  Family History  Problem Relation Age of Onset  . Ovarian cancer Mother   . Heart disease Father   . Stroke Sister   . Stroke Brother   . Cancer Brother   . Heart disease Brother      Current Outpatient Prescriptions:  .  allopurinol (ZYLOPRIM) 300 MG tablet, Take 1 tablet (300 mg total) by mouth daily., Disp: 30 tablet, Rfl: 5 .  cetirizine (ZYRTEC) 10 MG tablet, Take 10 mg by mouth daily as  needed for allergies., Disp: , Rfl:  .  escitalopram (LEXAPRO) 10 MG tablet, TAKE 1 TABLET (10 MG) BY MOUTH DAILY AT BEDTIME., Disp: 90 tablet, Rfl: 0 .  feeding supplement, ENSURE ENLIVE, (ENSURE ENLIVE) LIQD, Take 237 mLs by mouth 2 (two) times daily between meals., Disp: 237 mL, Rfl: 12 .  fluocinonide cream (LIDEX) 9.47 %, Apply 1 application topically daily as needed (For spots on face/itching.). , Disp: , Rfl:  .  KLOR-CON M20 20 MEQ tablet, TAKE 1 TABLET BY MOUTH EVERY DAY, Disp: 30 tablet, Rfl: 3 .  levothyroxine (SYNTHROID, LEVOTHROID) 100 MCG tablet, TAKE 1 TABLET (100 MCG TOTAL) BY MOUTH DAILY., Disp: 90 tablet, Rfl: 3 .  LORazepam (ATIVAN) 0.5 MG tablet, Take 1 tab daily and 2nd as needed, Disp: 45 tablet, Rfl: 1 .  LOTEMAX 0.5 % OINT, APPLY 1/2 INCH RIBBON INTO AFFECTED EYE AS DIRECTED, Disp: , Rfl: 2 .  Magnesium Oxide 200 MG TABS, Take 1 tablet (200 mg total) by mouth daily., Disp: 30 tablet, Rfl: 6 .  oxybutynin (DITROPAN) 5 MG tablet, TAKE 1 TABLET (5 MG TOTAL) BY MOUTH DAILY AS NEEDED., Disp: 30 tablet, Rfl: 3 .  psyllium (METAMUCIL SMOOTH TEXTURE) 28 % packet, Take 1 packet by mouth daily with breakfast., Disp: , Rfl:  .  ranitidine (ZANTAC) 150 MG tablet, Take 150 mg by mouth 3 (three) times daily., Disp: , Rfl:  .  REFRESH TEARS 0.5 % SOLN, 1-2 DROPS IN [facility] EYE TWICE DAILY, Disp: , Rfl: 0 .  spironolactone (ALDACTONE) 25 MG tablet,  TAKE 1 TABLET (25 MG TOTAL) BY MOUTH DAILY., Disp: 30 tablet, Rfl: 3   Review of Systems     Objective:   Physical Exam  Constitutional: She is oriented to person, place, and time. She appears well-developed and well-nourished. No distress.  HENT:  Head: Normocephalic and atraumatic.  Right Ear: External ear normal.  Left Ear: External ear normal.  Mouth/Throat: Oropharynx is clear and moist. No oropharyngeal exudate.  Eyes: Pupils are equal, round, and reactive to light. Conjunctivae and EOM are normal. Right eye exhibits no discharge.  Left eye exhibits no discharge. No scleral icterus.  Neck: Normal range of motion. Neck supple. No JVD present. No tracheal deviation present. No thyromegaly present.  Cardiovascular: Normal rate, regular rhythm, normal heart sounds and intact distal pulses.  Exam reveals no gallop and no friction rub.   No murmur heard. Pulmonary/Chest: Effort normal and breath sounds normal. No respiratory distress. She has no wheezes. She has no rales. She exhibits no tenderness.  Abdominal: Soft. Bowel sounds are normal. She exhibits no distension and no mass. There is no tenderness. There is no rebound and no guarding.  Musculoskeletal: Normal range of motion. She exhibits no edema or tenderness.  Lymphadenopathy:    She has no cervical adenopathy.  Neurological: She is alert and oriented to person, place, and time. She has normal reflexes. No cranial nerve deficit. She exhibits normal muscle tone. Coordination normal.  Skin: Skin is warm and dry. No rash noted. She is not diaphoretic. No erythema. No pallor.  Psychiatric: She has a normal mood and affect. Her behavior is normal. Judgment and thought content normal.  Vitals reviewed.  Vitals:   02/16/17 1211  BP: 108/64  Pulse: 71  SpO2: 98%  Weight: 171 lb (77.6 kg)  Height: 5\' 4"  (1.626 m)    Estimated body mass index is 29.35 kg/m as calculated from the following:   Height as of this encounter: 5\' 4"  (1.626 m).   Weight as of this encounter: 171 lb (77.6 kg).       Assessment:       ICD-10-CM   1. Hemoptysis R04.2 DG Chest 2 View  2. Shortness of breath R06.02   3. Chronic cough R05        Plan:     Hemoptysis   - probably just from cough - CT chest 2017 was clear - be on safe side do CXR 2 view today  Shortness of breath - unclear if due to asthma but respect you do not want to go back to breo - probably related to weight and physical deconditioning - agree we will keep an eye on it  Chronic cough - glad is not a problem  now  Followup 9 month or sooner if neeed  Dr. Brand Males, M.D., [facility].C.P Pulmonary and Critical Care Medicine Staff Physician [facility] Pager: [phone], If no answer or between  15:00h - 7:00h: call [phone]  02/16/2017 12:29 PM

## 2017-02-18 ENCOUNTER — Other Ambulatory Visit: Payer: Self-pay | Admitting: Internal Medicine

## 2017-02-21 ENCOUNTER — Ambulatory Visit: Payer: Medicare Other | Admitting: Psychology

## 2017-02-21 ENCOUNTER — Encounter: Payer: Self-pay | Admitting: Internal Medicine

## 2017-02-21 ENCOUNTER — Ambulatory Visit (INDEPENDENT_AMBULATORY_CARE_PROVIDER_SITE_OTHER): Payer: Medicare Other | Admitting: Internal Medicine

## 2017-02-21 DIAGNOSIS — I480 Paroxysmal atrial fibrillation: Secondary | ICD-10-CM

## 2017-02-21 DIAGNOSIS — F411 Generalized anxiety disorder: Secondary | ICD-10-CM | POA: Diagnosis not present

## 2017-02-21 DIAGNOSIS — I2510 Atherosclerotic heart disease of native coronary artery without angina pectoris: Secondary | ICD-10-CM

## 2017-02-21 DIAGNOSIS — F320 Major depressive disorder, single episode, mild: Secondary | ICD-10-CM

## 2017-02-21 DIAGNOSIS — E559 Vitamin D deficiency, unspecified: Secondary | ICD-10-CM | POA: Diagnosis not present

## 2017-02-21 MED ORDER — TURMERIC 500 MG PO TABS: 1.0000 | ORAL_TABLET | Freq: Two times a day (BID) | ORAL | 3 refills | Status: DC

## 2017-02-21 NOTE — Assessment & Plan Note (Signed)
No relapse 

## 2017-02-21 NOTE — Assessment & Plan Note (Signed)
Discussed.

## 2017-02-21 NOTE — Assessment & Plan Note (Signed)
On Vit D 

## 2017-02-21 NOTE — Assessment & Plan Note (Signed)
F/u w/Card if needed

## 2017-02-21 NOTE — Progress Notes (Signed)
Subjective:  Patient ID: [name], female    DOB: [birth year]  Age: 83 y.o. MRN: [id number]  CC: No chief complaint on file.   HPI [name] presents for CFS, IBS, anxiety - f/u. No much change. Pt is worried about future - lives on the 2nd floor. We discussed PACE w/triad  Outpatient Medications Prior to Visit  Medication Sig Dispense Refill  . allopurinol (ZYLOPRIM) 300 MG tablet Take 1 tablet (300 mg total) by mouth daily. 30 tablet 5  . cetirizine (ZYRTEC) 10 MG tablet Take 10 mg by mouth daily as needed for allergies.    [name] escitalopram (LEXAPRO) 10 MG tablet TAKE 1 TABLET (10 MG) BY MOUTH DAILY AT BEDTIME. 90 tablet 0  . feeding supplement, ENSURE ENLIVE, (ENSURE ENLIVE) LIQD Take 237 mLs by mouth 2 (two) times daily between meals. 237 mL 12  . KLOR-CON M20 20 MEQ tablet TAKE 1 TABLET BY MOUTH EVERY DAY 30 tablet 3  . levothyroxine (SYNTHROID, LEVOTHROID) 100 MCG tablet TAKE 1 TABLET (100 MCG TOTAL) BY MOUTH DAILY. 90 tablet 3  . LORazepam (ATIVAN) 0.5 MG tablet Take 1 tab daily and 2nd as needed 45 tablet 1  . Magnesium Oxide 200 MG TABS Take 1 tablet (200 mg total) by mouth daily. 30 tablet 6  . oxybutynin (DITROPAN) 5 MG tablet TAKE 1 TABLET (5 MG TOTAL) BY MOUTH DAILY AS NEEDED. 30 tablet 3  . psyllium (METAMUCIL SMOOTH TEXTURE) 28 % packet Take 1 packet by mouth daily with breakfast.    . ranitidine (ZANTAC) 150 MG tablet Take 150 mg by mouth 3 (three) times daily.    [name] REFRESH TEARS 0.5 % SOLN 1-2 DROPS IN [facility] EYE TWICE DAILY  0  . spironolactone (ALDACTONE) 25 MG tablet TAKE 1 TABLET (25 MG TOTAL) BY MOUTH DAILY. 30 tablet 0  . fluocinonide cream (LIDEX) 0.34 % Apply 1 application topically daily as needed (For spots on face/itching.).     [name] LOTEMAX 0.5 % OINT APPLY 1/2 INCH RIBBON INTO AFFECTED EYE AS DIRECTED  2   No facility-administered medications prior to visit.     ROS Review of Systems  Constitutional: Positive for fatigue. Negative for activity change,  appetite change, chills and unexpected weight change.  HENT: Negative for congestion, mouth sores and sinus pressure.   Eyes: Negative for visual disturbance.  Respiratory: Negative for cough and chest tightness.   Gastrointestinal: Positive for abdominal pain and diarrhea. Negative for nausea.  Genitourinary: Negative for difficulty urinating, frequency and vaginal pain.  Musculoskeletal: Negative for back pain and gait problem.  Skin: Negative for pallor and rash.  Neurological: Negative for dizziness, tremors, weakness, numbness and headaches.  Psychiatric/Behavioral: Negative for confusion and sleep disturbance.    Objective:  BP 116/72 (BP Location: Right Arm, Patient Position: Sitting, Cuff Size: Large)   Pulse 68   Temp 98.5 F (36.9 C) (Oral)   Ht 5\' 4"  (1.626 m)   Wt 166 lb (75.3 kg)   SpO2 99%   BMI 28.49 kg/m   BP Readings from Last 3 Encounters:  02/21/17 116/72  02/16/17 108/64  01/17/17 132/86    Wt Readings from Last 3 Encounters:  02/21/17 166 lb (75.3 kg)  02/16/17 171 lb (77.6 kg)  01/17/17 170 lb (77.1 kg)    Physical Exam  Constitutional: She appears well-developed. No distress.  HENT:  Head: Normocephalic.  Right Ear: External ear normal.  Left Ear: External ear normal.  Nose: Nose normal.  Mouth/Throat: Oropharynx is  clear and moist.  Eyes: Pupils are equal, round, and reactive to light. Conjunctivae are normal. Right eye exhibits no discharge. Left eye exhibits no discharge.  Neck: Normal range of motion. Neck supple. No JVD present. No tracheal deviation present. No thyromegaly present.  Cardiovascular: Normal rate, regular rhythm and normal heart sounds.   Pulmonary/Chest: No stridor. No respiratory distress. She has no wheezes.  Abdominal: Soft. Bowel sounds are normal. She exhibits no distension and no mass. There is no tenderness. There is no rebound and no guarding.  Musculoskeletal: She exhibits no edema or tenderness.  Lymphadenopathy:      She has no cervical adenopathy.  Neurological: She displays normal reflexes. No cranial nerve deficit. She exhibits normal muscle tone. Coordination normal.  Skin: No rash noted. No erythema.  Psychiatric: Her behavior is normal. Judgment and thought content normal.  tearful   Lab Results  Component Value Date   WBC 4.6 08/06/2016   HGB 13.0 08/06/2016   HCT 39.4 08/06/2016   PLT 197.0 08/06/2016   GLUCOSE 74 12/31/2016   CHOL 174 05/11/2010   TRIG 78.0 05/11/2010   HDL 52.30 05/11/2010   LDLDIRECT 130.8 09/30/2006   LDLCALC 106 (H) 05/11/2010   ALT 11 08/06/2016   AST 17 08/06/2016   NA 136 12/31/2016   K 4.2 12/31/2016   CL 100 (L) 12/31/2016   CREATININE 1.14 (H) 12/31/2016   BUN 17 12/31/2016   CO2 28 12/31/2016   TSH 4.45 10/13/2016   INR 1.08 06/30/2016   HGBA1C 6.0 04/10/2012    Dg Chest 2 View  Result Date: 02/16/2017 CLINICAL DATA:  Cough, hemoptysis. EXAM: CHEST  2 VIEW COMPARISON:  03/22/2016. FINDINGS: Trachea is midline. Heart size normal. Thoracic aorta is calcified. Pacemaker lead tips are in the right atrium and right ventricle. Lungs are somewhat hyperinflated but clear. No pleural fluid. IMPRESSION: Mild hyperinflation without acute finding. Electronically Signed   By: Lorin Picket M.D.   On: 02/16/2017 14:57    Assessment & Plan:   There are no diagnoses linked to this encounter. I have discontinued Ms. Remmers's fluocinonide cream and LOTEMAX. I am also having her maintain her ranitidine, cetirizine, psyllium, feeding supplement (ENSURE ENLIVE), levothyroxine, allopurinol, escitalopram, LORazepam, KLOR-CON M20, oxybutynin, Magnesium Oxide, REFRESH TEARS, and spironolactone.  No orders of the defined types were placed in this encounter.    Follow-up: No Follow-up on file.  Walker Kehr, MD

## 2017-02-21 NOTE — Assessment & Plan Note (Signed)
No angina 

## 2017-02-21 NOTE — Assessment & Plan Note (Signed)
Pt is planning to move to a retirement community  Dr Sonia Side Chronic Lexapro Lorazepam  Potential benefits of a long term benzodiazepines  use as well as potential risks  and complications were explained to the patient and were aknowledged.

## 2017-02-27 ENCOUNTER — Encounter: Payer: Self-pay | Admitting: Internal Medicine

## 2017-02-27 DIAGNOSIS — E2839 Other primary ovarian failure: Secondary | ICD-10-CM

## 2017-02-28 ENCOUNTER — Ambulatory Visit: Payer: Self-pay | Admitting: Psychology

## 2017-03-07 ENCOUNTER — Telehealth: Payer: Self-pay | Admitting: *Deleted

## 2017-03-07 NOTE — Telephone Encounter (Signed)
Patient called to say she needs a supply order but she call after her appointment with [facility].

## 2017-03-11 ENCOUNTER — Other Ambulatory Visit (HOSPITAL_COMMUNITY): Payer: Self-pay | Admitting: Psychiatry

## 2017-03-11 DIAGNOSIS — F330 Major depressive disorder, recurrent, mild: Secondary | ICD-10-CM

## 2017-03-14 ENCOUNTER — Telehealth: Payer: Self-pay | Admitting: Internal Medicine

## 2017-03-14 ENCOUNTER — Other Ambulatory Visit (HOSPITAL_COMMUNITY): Payer: Self-pay

## 2017-03-14 ENCOUNTER — Ambulatory Visit (INDEPENDENT_AMBULATORY_CARE_PROVIDER_SITE_OTHER): Payer: Medicare Other | Admitting: Psychology

## 2017-03-14 DIAGNOSIS — F332 Major depressive disorder, recurrent severe without psychotic features: Secondary | ICD-10-CM

## 2017-03-14 DIAGNOSIS — F330 Major depressive disorder, recurrent, mild: Secondary | ICD-10-CM

## 2017-03-14 MED ORDER — LORAZEPAM 0.5 MG PO TABS: ORAL_TABLET | ORAL | 0 refills | Status: DC

## 2017-03-14 NOTE — Telephone Encounter (Signed)
°  Dr.Plot has filled out out the forms. Patient needs a TB test done. She has set up an appointment for 11/26 with the nurse. I have printed the Snapshot with the medication and problem list on it to fax with the forms once we have TB results.   Copied from [place] [id number]. Topic: General - Other >> Mar 11, 2017 12:59 PM Peace, Tammy L wrote: Reason for CRM: Patient has dropped off living physicians report to be completed.  I have given to [place] for tracking.   >> Mar 11, 2017  4:03 PM Para Skeans A wrote: 03/11/17 Forms given to PCP to advise on.

## 2017-03-15 DIAGNOSIS — M8589 Other specified disorders of bone density and structure, multiple sites: Secondary | ICD-10-CM | POA: Diagnosis not present

## 2017-03-15 DIAGNOSIS — J3089 Other allergic rhinitis: Secondary | ICD-10-CM | POA: Diagnosis not present

## 2017-03-15 DIAGNOSIS — J301 Allergic rhinitis due to pollen: Secondary | ICD-10-CM | POA: Diagnosis not present

## 2017-03-15 LAB — HM DEXA SCAN: HM Dexa Scan: -1.5

## 2017-03-21 ENCOUNTER — Ambulatory Visit (INDEPENDENT_AMBULATORY_CARE_PROVIDER_SITE_OTHER): Payer: Medicare Other | Admitting: *Deleted

## 2017-03-21 DIAGNOSIS — Z23 Encounter for immunization: Secondary | ICD-10-CM

## 2017-03-21 NOTE — Progress Notes (Signed)
Pt is also needing to pick-up FL2 form that was dropped off last weekw/ TB skin results added to paperwork!

## 2017-03-22 ENCOUNTER — Encounter (HOSPITAL_COMMUNITY): Payer: Self-pay | Admitting: Psychiatry

## 2017-03-22 ENCOUNTER — Ambulatory Visit (INDEPENDENT_AMBULATORY_CARE_PROVIDER_SITE_OTHER): Payer: Medicare Other | Admitting: Psychiatry

## 2017-03-22 DIAGNOSIS — I2510 Atherosclerotic heart disease of native coronary artery without angina pectoris: Secondary | ICD-10-CM

## 2017-03-22 DIAGNOSIS — F419 Anxiety disorder, unspecified: Secondary | ICD-10-CM

## 2017-03-22 DIAGNOSIS — F330 Major depressive disorder, recurrent, mild: Secondary | ICD-10-CM

## 2017-03-22 DIAGNOSIS — Z79899 Other long term (current) drug therapy: Secondary | ICD-10-CM

## 2017-03-22 MED ORDER — ESCITALOPRAM OXALATE 10 MG PO TABS: ORAL_TABLET | ORAL | 0 refills | Status: DC

## 2017-03-22 NOTE — Progress Notes (Signed)
BH MD/PA/NP OP Progress Note  03/22/2017 2:57 PM [name]  MRN:  [id number]  Chief Complaint: I am moving to a new location.  I cannot climb up stairs.  HPI: Patient came for her follow-up appointment.  She is anxious because she is moving next week to independent living.  She is sad because she cannot live in her apartment because she cannot walk upstairs.  She endorsed there are times that she has so much difficulty lifting the groceries that she has to leave in her car until her son came to help her.  She is taking her medication denies any side effects.  She is going to [facility] independent living.  She is hoping that she can make some friends there.  She admitted feeling boredom living by herself and hoping that the new place may offer some social activities.  She had a good Thanksgiving at her daughter's house.  Patient told her son and daughter are very independent and busy and she really see them.  She was happy to see them on Thanksgiving.  Patient denies any irritability, anger, mania, crying spells or any feeling of hopelessness or worthlessness.  She is taking Lexapro and denies any side effects.  She rarely takes lorazepam.  She denies any major panic attack.  Patient denies drinking alcohol or using any illegal substances.  She is seeing her primary care physician regularly.  Her energy level is fair.  She is scheduled to see her cardiologist in first week of December.  Visit Diagnosis:    ICD-10-CM   1. Major depressive disorder, recurrent episode, mild (HCC) F33.0 escitalopram (LEXAPRO) 10 MG tablet    Past Psychiatric History: Reviewed. Patient has one history of psychiatric inpatient treatment many years ago when she was admitted due to suicidal thinking. Patient denies any history of suicidal attempt or any psychosis.  Past Medical History:  Past Medical History:  Diagnosis Date  . Anxiety   . Bradycardia   . Breast cancer, left breast ([place]) 1995  . Breast cancer,  right breast ([place]) 2002  . CHF (congestive heart failure) ([place])   . Depression    Dr Cheryln Manly  . Discoid lupus    skin  . Full dentures   . GERD (gastroesophageal reflux disease)   . Glaucoma   . Gout   . Hx of echocardiogram    a.  Echocardiogram (03/2011): Mild LVH, EF 27-06%, grade 1 diastolic dysfunction, mild MR, mild to moderate TR, PASP 42;  b. Echo (05/2013):  Mod LVH, EF 65-70%, no WMA, Gr 1 DD, mild MR, mod TR, PASP 39  . Hx of exercise stress test    a. ETT-echocardiogram (06/08/11): Sub-optimal exercise. Test stopped early due to dizziness and hypotension. EF 60%.  Non-diagnostic.;   b. Lexiscan Myoview (06/2013):  Diaphragmatic attenuation, no ischemia, EF 61%.  Low Risk  . Hypertension   . Hypothyroidism   . IBS (irritable bowel syndrome)   . IBS (irritable bowel syndrome)   . Incontinence   . OSA (obstructive sleep apnea) 07/20/2016   Mild with AHI 12/hr now on CPAP  . OSA on CPAP    "quit wearing mask; retested; not enough information; to be tested again 04/08/2016"  . Osteoarthritis   . Osteopenia   . Pacemaker 2009   Brady//Chronotropic incompetence with normal pacemaker function  . Thyroid disease   . Wears hearing aid    both ears    Past Surgical History:  Procedure Laterality Date  . BREAST BIOPSY  Left 1995  . BREAST LUMPECTOMY Left 1995   axillary node dissectoin  . CARDIAC CATHETERIZATION N/A 02/13/2016   Procedure: Right Heart Cath;  Surgeon: Larey Dresser, MD;  Location: [facility];  Service: Cardiovascular;  Laterality: N/A;  . CATARACT EXTRACTION W/ INTRAOCULAR LENS  IMPLANT, BILATERAL Bilateral 06/2009 - 12.2915   left - right  . DORSAL COMPARTMENT RELEASE  2000   left  . HAMMER TOE SURGERY Left 2004  . HEMORRHOID BANDING    . INSERT / REPLACE / REMOVE PACEMAKER  2009  . KNEE ARTHROSCOPY Left 08/29/2012   Procedure: LEFT KNEE ARTHROSCOPY ;  Surgeon: Hessie Dibble, MD;  Location: [facility];  Service: Orthopedics;   Laterality: Left;  [name] MASTECTOMY Bilateral 09/2000   nbx  . TRIGGER FINGER RELEASE Right 07/31/2013   Procedure: EXCISE MASS RIGHT INDEX A-1 PLLLEY RELEASE A-1 RIGHT INDEX;  Surgeon: Cammie Sickle., MD;  Location: [facility];  Service: Orthopedics;  Laterality: Right;  [name] VAGINAL HYSTERECTOMY      Family Psychiatric History: Viewed.  Family History:  Family History  Problem Relation Age of Onset  . Ovarian cancer Mother   . Heart disease Father   . Stroke Sister   . Stroke Brother   . Cancer Brother   . Heart disease Brother     Social History:  Social History   Socioeconomic History  . Marital status: Widowed    Spouse name: Not on file  . Number of children: Not on file  . Years of education: Not on file  . Highest education level: Not on file  Social Needs  . Financial resource strain: Not on file  . Food insecurity - worry: Not on file  . Food insecurity - inability: Not on file  . Transportation needs - medical: Not on file  . Transportation needs - non-medical: Not on file  Occupational History  . Occupation: retired  Tobacco Use  . Smoking status: Never Smoker  . Smokeless tobacco: Never Used  Substance and Sexual Activity  . Alcohol use: No    Alcohol/week: 0.0 oz  . Drug use: No  . Sexual activity: No  Other Topics Concern  . Not on file  Social History Narrative   She is a vegan now 2010    Allergies:  Allergies  Allergen Reactions  . Lactase Diarrhea  . Amlodipine Besy-Benazepril Hcl Swelling  . Amlodipine Besylate Swelling  . Clonidine Hydrochloride     REACTION: tired  . Lactose Intolerance (Gi) Other (See Comments)  . Valsartan Other (See Comments)    Tongue swelling   . Verapamil     Metabolic Disorder Labs: Lab Results  Component Value Date   HGBA1C 6.0 04/10/2012   No results found for: PROLACTIN Lab Results  Component Value Date   CHOL 174 05/11/2010   TRIG 78.0 05/11/2010   HDL 52.30 05/11/2010   CHOLHDL 3  05/11/2010   VLDL 15.6 05/11/2010   LDLCALC 106 (H) 05/11/2010   LDLCALC 105 (H) 10/14/2009   Lab Results  Component Value Date   TSH 4.45 10/13/2016   TSH 1.700 03/22/2016    Therapeutic Level Labs: No results found for: LITHIUM No results found for: VALPROATE No components found for:  CBMZ  Current Medications: Current Outpatient Medications  Medication Sig Dispense Refill  . allopurinol (ZYLOPRIM) 300 MG tablet Take 1 tablet (300 mg total) by mouth daily. 30 tablet 5  . cetirizine (ZYRTEC) 10 MG tablet  Take 10 mg by mouth daily as needed for allergies.    [name] escitalopram (LEXAPRO) 10 MG tablet TAKE 1 TABLET (10 MG) BY MOUTH DAILY AT BEDTIME. 90 tablet 0  . feeding supplement, ENSURE ENLIVE, (ENSURE ENLIVE) LIQD Take 237 mLs by mouth 2 (two) times daily between meals. 237 mL 12  . KLOR-CON M20 20 MEQ tablet TAKE 1 TABLET BY MOUTH EVERY DAY 30 tablet 3  . levothyroxine (SYNTHROID, LEVOTHROID) 100 MCG tablet TAKE 1 TABLET (100 MCG TOTAL) BY MOUTH DAILY. 90 tablet 3  . LORazepam (ATIVAN) 0.5 MG tablet Take 1 tab daily and 2nd as needed 45 tablet 0  . Magnesium Oxide 200 MG TABS Take 1 tablet (200 mg total) by mouth daily. 30 tablet 6  . oxybutynin (DITROPAN) 5 MG tablet TAKE 1 TABLET (5 MG TOTAL) BY MOUTH DAILY AS NEEDED. 30 tablet 3  . psyllium (METAMUCIL SMOOTH TEXTURE) 28 % packet Take 1 packet by mouth daily with breakfast.    . ranitidine (ZANTAC) 150 MG tablet Take 150 mg by mouth 3 (three) times daily.    [name] REFRESH TEARS 0.5 % SOLN 1-2 DROPS IN [facility] EYE TWICE DAILY  0  . spironolactone (ALDACTONE) 25 MG tablet TAKE 1 TABLET (25 MG TOTAL) BY MOUTH DAILY. 30 tablet 0  . Turmeric 500 MG TABS Take 1 tablet by mouth 2 (two) times daily. 100 tablet 3   No current facility-administered medications for this visit.      Musculoskeletal: Strength & Muscle Tone: within normal limits Gait & Station: normal Patient leans: N/A  Psychiatric Specialty Exam: ROS  There were no vitals  taken for this visit.There is no height or weight on file to calculate BMI.  General Appearance: Casual  Eye Contact:  Good  Speech:  Slow  Volume:  Normal  Mood:  Anxious  Affect:  Appropriate  Thought Process:  Goal Directed  Orientation:  Full (Time, Place, and Person)  Thought Content: Rumination   Suicidal Thoughts:  No  Homicidal Thoughts:  No  Memory:  Immediate;   Good Recent;   Good Remote;   Good  Judgement:  Good  Insight:  Good  Psychomotor Activity:  Decreased  Concentration:  Concentration: Fair and Attention Span: Fair  Recall:  Good  Fund of Knowledge: Good  Language: Good  Akathisia:  No  Handed:  Right  AIMS (if indicated): not done  Assets:  Communication Skills Desire for Improvement Resilience Social Support  ADL's:  Intact  Cognition: WNL  Sleep:  Good   Screenings: PHQ2-9     Office Visit from 02/21/2017 in [facility] Patient Outreach Telephone from 07/14/2016 in [place] Patient Outreach Telephone from 07/12/2016 in [place] Patient Outreach Telephone from 07/08/2016 in [place] Visit from 07/31/2014 in [place]  PHQ-2 Total Score  2  2  2  2  2   PHQ-9 Total Score  No data  10  10  10   No data       Assessment and Plan: Major depressive disorder, recurrent.  Anxiety disorder NOS.  Reassurance given.  Patient like to continue current dose of medication.  She has no side effects.  I will continue Lexapro 10 mg daily and lorazepam 0.5 mg as needed for severe anxiety and nervousness.  Encouraged to keep appointment with Dr. Cheryln Manly for counseling.  Recommended to call us back if she has any question, concern if she feels worsening of the  symptoms.  Follow-up in 3 months.   Kathlee Nations, MD 03/22/2017, 2:57 PM

## 2017-03-23 ENCOUNTER — Other Ambulatory Visit: Payer: Self-pay | Admitting: Internal Medicine

## 2017-03-23 DIAGNOSIS — Z0279 Encounter for issue of other medical certificate: Secondary | ICD-10-CM

## 2017-03-23 LAB — TB SKIN TEST
Induration: 0 mm
TB SKIN TEST: NEGATIVE

## 2017-03-23 NOTE — Telephone Encounter (Signed)
Forms have been completed, copy sent to scan, original to patient, she will take them to the living center, &charged for.

## 2017-03-28 ENCOUNTER — Ambulatory Visit (INDEPENDENT_AMBULATORY_CARE_PROVIDER_SITE_OTHER): Payer: Medicare Other | Admitting: Psychology

## 2017-03-28 ENCOUNTER — Ambulatory Visit (HOSPITAL_COMMUNITY)
Admission: RE | Admit: 2017-03-28 | Discharge: 2017-03-28 | Disposition: A | Discharge status: Home / Self Care | Payer: Medicare Other | Source: Ambulatory Visit | Attending: Cardiology | Admitting: Cardiology

## 2017-03-28 ENCOUNTER — Encounter (HOSPITAL_COMMUNITY): Payer: Self-pay | Admitting: Cardiology

## 2017-03-28 VITALS — BP 113/70 | HR 64 | Wt 172.0 lb

## 2017-03-28 DIAGNOSIS — I272 Pulmonary hypertension, unspecified: Secondary | ICD-10-CM | POA: Diagnosis not present

## 2017-03-28 DIAGNOSIS — F332 Major depressive disorder, recurrent severe without psychotic features: Secondary | ICD-10-CM | POA: Diagnosis not present

## 2017-03-28 DIAGNOSIS — G4733 Obstructive sleep apnea (adult) (pediatric): Secondary | ICD-10-CM | POA: Insufficient documentation

## 2017-03-28 DIAGNOSIS — R0609 Other forms of dyspnea: Secondary | ICD-10-CM

## 2017-03-28 DIAGNOSIS — I5032 Chronic diastolic (congestive) heart failure: Secondary | ICD-10-CM | POA: Diagnosis not present

## 2017-03-28 DIAGNOSIS — R0600 Dyspnea, unspecified: Secondary | ICD-10-CM

## 2017-03-28 DIAGNOSIS — I10 Essential (primary) hypertension: Secondary | ICD-10-CM | POA: Insufficient documentation

## 2017-03-28 DIAGNOSIS — Z79899 Other long term (current) drug therapy: Secondary | ICD-10-CM | POA: Diagnosis not present

## 2017-03-28 LAB — BASIC METABOLIC PANEL
Anion gap: 6 (ref 5–15)
BUN: 31 mg/dL — ABNORMAL HIGH (ref 6–20)
CO2: 25 mmol/L (ref 22–32)
Calcium: 9.2 mg/dL (ref 8.9–10.3)
Chloride: 108 mmol/L (ref 101–111)
Creatinine, Ser: 1.09 mg/dL — ABNORMAL HIGH (ref 0.44–1.00)
GFR calc Af Amer: 52 mL/min — ABNORMAL LOW (ref >60)
GFR calc non Af Amer: 45 mL/min — ABNORMAL LOW (ref >60)
Glucose, Bld: 81 mg/dL (ref 65–99)
Potassium: 3.9 mmol/L (ref 3.5–5.1)
Sodium: 139 mmol/L (ref 135–145)

## 2017-03-28 NOTE — Patient Instructions (Signed)
Labs drawn today (if we do not call you, then your lab work was stable)   Your physician recommends that you schedule a follow-up appointment in: 1 year with Dr. McLean    

## 2017-03-29 ENCOUNTER — Ambulatory Visit (INDEPENDENT_AMBULATORY_CARE_PROVIDER_SITE_OTHER): Payer: Medicare Other | Admitting: *Deleted

## 2017-03-29 DIAGNOSIS — I495 Sick sinus syndrome: Secondary | ICD-10-CM

## 2017-03-29 NOTE — Progress Notes (Signed)
Patient ID: [name], female   DOB: 1930/01/30, 83 y.o.   MRN: [id number] PCP: [facility] EP: Caryl Comes HF Cardiology: Aundra Dubin  83 yo with history of SA nodal dysfunction s/p PPM, HTN, and chronic exertional dyspnea presents for followup of dyspnea.  She has been seeing Dr Chase Caller with pulmonary and sees Dr Caryl Comes for her pacemaker. Over the last few years, she has noted significant exertional dyspnea that has been progressive.  She is short of breath with steps or any incline.  She is not short of breath walking on flat ground but is short of breath carrying groceries or doing moderate housework.  No chest pain.  No lightheadedness or palpitations.  There was some concern for asthma, but she says that Breo did not help, and she is no longer using it.  She also has prominent fatigue.   Echo in 2/15 showed normal EF with mild pulmonary hypertension (PASP 39 mmHg).  Recent echo in 7/17, however, showed PA systolic pressure 30 mmHg with normal RV size and systolic function and LVEF 55-60%.  PFTs in 3/15 were normal except for a mildly decreased DLCO. She had a high resolution CT of the chest in 5/17 that showed no ILD but PA was dilated suggesting possible pulmonary hypertension.  She was referred to this office for evaluation of possible pulmonary hypertension based on these findings. I tried her on empiric Lasix 20 mg daily, but this did not help her dyspnea and she has now stopped it.   I had her do a Agricultural consultant in 9/17.  This was low risk with no ischemia or infarction. We finally ended up doing a right heart cath in 10/17 that showed normal filling pressures and no pulmonary hypertension.  She had CPX in 9/18 that was submaximal and showed no significant cardiac or pulmonary limitation.   She recently moved to independent living facility [facility]).  This seems to have taken a lot of anxiety off her.  She is feeling better.  No significant dyspnea with her ADLs, mild dyspnea with stairs.  No  chest pain.  No orthopnea/PND.  Wants to use the gym at her facility.   Labs (3/17): TSH normal.  Labs (6/17): K 3.9, creatinine 1.01, RF negative, ANA negative Labs (7/17): K 3.8, creatinine 0.91, BNP 224, HCT 41.1 Labs (9/17): K 4.2, creatinine 0.9, BNP 124 Labs (10/17): K 4.4, creatinine 0.98 Labs (11/17): TSH normal Labs (3/18): hgb 12.8 Labs (5/18): K 4.3, creatinine 0.98 Labs (9/18): K 4.2, creatinine 1.14  PMH: 1. Gout 2. Depression 3. Hypothyroidism 4. SA node dysfunction with Medtronic PPM. 5. Stress echo (2012) normal. 6. GERD 7. IBS 8. HTN 9. Exertional dyspnea:  - PFTs (3/15) normal except for slightly decreased DLCO - Echo (2/15) with EF 65-70%, mild MR, PASP 39 mmHg.  - High resolution CT chest (5/17) with no evidence for IDL, PA enlarged concerning for pulmonary hypertension. - Possible asthma - Echo (7/17): EF 55-60%, mild MR with mitral valve prolapse, moderate TR, normal RV size and systolic function, PASP 30 mmHg.  - Lexiscan Cardiolite (9/17) with EF 55%, no ischemia/infarction - RHC (10/17) with mean RA 6, PA 28/9, mean PCWP 7, CI 2.37 - CPX (9/18): Peak VO2 11.2, VE/VCO2 slope 0.93, submaximal study => no definite cardiac or pulmonary limitation.  10. Ascending aorta dilation: 4.0 cm on CT chest 5/17.  11. OSA: Using CPAP.  12. Diverticulitis 3/18  SH: Lives with son, nonsmoker, no ETOH.  FH: CVAs, father with MI, multiple  siblings with coronary disease (has > 10 siblings).   ROS: All systems reviewed and negative except as per HPI.   Current Outpatient Medications  Medication Sig Dispense Refill  . allopurinol (ZYLOPRIM) 300 MG tablet Take 1 tablet (300 mg total) by mouth daily. 30 tablet 5  . cetirizine (ZYRTEC) 10 MG tablet Take 10 mg by mouth daily as needed for allergies.    [name] escitalopram (LEXAPRO) 10 MG tablet TAKE 1 TABLET (10 MG) BY MOUTH DAILY AT BEDTIME. 90 tablet 0  . feeding supplement, ENSURE ENLIVE, (ENSURE ENLIVE) LIQD Take 237 mLs by  mouth 2 (two) times daily between meals. 237 mL 12  . KLOR-CON M20 20 MEQ tablet TAKE 1 TABLET BY MOUTH EVERY DAY 30 tablet 3  . levothyroxine (SYNTHROID, LEVOTHROID) 100 MCG tablet TAKE 1 TABLET (100 MCG TOTAL) BY MOUTH DAILY. 90 tablet 3  . LORazepam (ATIVAN) 0.5 MG tablet Take 1 tab daily and 2nd as needed 45 tablet 0  . Magnesium Oxide 200 MG TABS Take 1 tablet (200 mg total) by mouth daily. 30 tablet 6  . oxybutynin (DITROPAN) 5 MG tablet TAKE 1 TABLET (5 MG TOTAL) BY MOUTH DAILY AS NEEDED. 30 tablet 3  . psyllium (METAMUCIL SMOOTH TEXTURE) 28 % packet Take 1 packet by mouth daily with breakfast.    . ranitidine (ZANTAC) 150 MG tablet Take 150 mg by mouth 3 (three) times daily.    [name] REFRESH TEARS 0.5 % SOLN 1-2 DROPS IN [facility] EYE TWICE DAILY  0  . spironolactone (ALDACTONE) 25 MG tablet TAKE 1 TABLET (25 MG TOTAL) BY MOUTH DAILY. 30 tablet 11  . Turmeric 500 MG TABS Take 1 tablet by mouth 2 (two) times daily. 100 tablet 3   No current facility-administered medications for this encounter.    BP 113/70 (BP Location: Left Arm, Patient Position: Sitting, Cuff Size: Normal)   Pulse 64   Wt 172 lb (78 kg)   SpO2 98%   BMI 29.99 kg/m  General: NAD Neck: No JVD, no thyromegaly or thyroid nodule.  Lungs: Clear to auscultation bilaterally with normal respiratory effort. CV: Nondisplaced PMI.  Heart regular S1/S2, no S3/S4, no murmur.  No peripheral edema.  No carotid bruit.  Normal pedal pulses.  Abdomen: Soft, nontender, no hepatosplenomegaly, no distention.  Skin: Intact without lesions or rashes.  Neurologic: Alert and oriented x 3.  Psych: Normal affect. Extremities: No clubbing or cyanosis.  HEENT: Normal.    Assessment/Plan: 1. Exertional dyspnea:  This was progressive over several years.  There was some concern for asthma in the past but Breo inhaler did not help.  Echo in 2/15 showed normal EF with mildly elevated PA systolic pressure. However, more recent echo in 7/17 showed normal  EF and RV function with no pulmonary hypertension.  PFTs in 3/15 were normal except for low DLCO, which could be indicative of pulmonary vascular disease.  High resolution chest CT in 5/17 did not show interstitial lung disease but did show an enlarged PA, concerning for possible pulmonary hypertension.  NYHA class II-III symptoms.  Lexiscan Cardiolite in 9/17 showed no ischemia or infarction.  She does not appear significantly volume overloaded on exam, but BNP has been elevated (224 pre-Lasix and 124 after starting Lasix).  A trial of Lasix 20 mg daily did not help her breathing despite lowering BNP and she is now off Lasix.  I did a right heart cath in 10/17.  This showed normal filling pressures and no pulmonary hypertension.  CPX in 9/18 was submaximal, but no definite cardiac or pulmonary limitation.  I suspect the dyspnea is due to deconditioning more than anything else with some involvement of diastolic CHF. - I encouraged her to increase her activity level, walk more. She plans to use the gym at [facility] do not think that she needs to restart Lasix.  - Check BMET given spironolactone use.  2. Dilated ascending aorta: 4.0 cm on 5/17 CT.  Will need future followup, can use MRA.   3. SA nodal dysfunction: She has a Medtronic PPM.  Followed by Dr Caryl Comes. 4. OSA: Now on CPAP.   I will see her back in 1 year    [name] 03/29/2017

## 2017-03-31 ENCOUNTER — Encounter: Payer: Self-pay | Admitting: Cardiology

## 2017-04-06 ENCOUNTER — Encounter: Payer: Medicare Other | Admitting: Internal Medicine

## 2017-04-06 ENCOUNTER — Other Ambulatory Visit (HOSPITAL_COMMUNITY): Payer: Self-pay | Admitting: Internal Medicine

## 2017-04-08 ENCOUNTER — Encounter: Payer: Self-pay | Admitting: Cardiology

## 2017-04-08 LAB — CUP PACEART REMOTE DEVICE CHECK
Battery Impedance: 2983 Ohm
Battery Remaining Longevity: 21 mo
Battery Voltage: 2.71 V
Date Time Interrogation Session: 20181207140325
Implantable Lead Location: 753859
Implantable Lead Location: 753860
Implantable Lead Model: 5076
Implantable Lead Model: 5076
Lead Channel Pacing Threshold Pulse Width: 0.4 ms
Lead Channel Setting Pacing Amplitude: 2.5 V
Lead Channel Setting Pacing Pulse Width: 0.4 ms
Lead Channel Setting Sensing Sensitivity: 2 mV
MDC IDC LEAD IMPLANT DT: 20090527
MDC IDC LEAD IMPLANT DT: 20090527
MDC IDC MSMT LEADCHNL RA IMPEDANCE VALUE: 542 Ohm
MDC IDC MSMT LEADCHNL RA PACING THRESHOLD AMPLITUDE: 0.625 V
MDC IDC MSMT LEADCHNL RV IMPEDANCE VALUE: 518 Ohm
MDC IDC MSMT LEADCHNL RV PACING THRESHOLD AMPLITUDE: 0.625 V
MDC IDC MSMT LEADCHNL RV PACING THRESHOLD PULSEWIDTH: 0.4 ms
MDC IDC PG IMPLANT DT: 20090527
MDC IDC SET LEADCHNL RA PACING AMPLITUDE: 2 V
MDC IDC STAT BRADY AP VP PERCENT: 7 %
MDC IDC STAT BRADY AP VS PERCENT: 85 %
MDC IDC STAT BRADY AS VP PERCENT: 1 %
MDC IDC STAT BRADY AS VS PERCENT: 7 %

## 2017-04-08 NOTE — Progress Notes (Signed)
Remote pacemaker transmission.   

## 2017-04-11 ENCOUNTER — Ambulatory Visit: Payer: Self-pay | Admitting: Psychology

## 2017-04-13 DIAGNOSIS — J3089 Other allergic rhinitis: Secondary | ICD-10-CM | POA: Diagnosis not present

## 2017-04-13 DIAGNOSIS — J301 Allergic rhinitis due to pollen: Secondary | ICD-10-CM | POA: Diagnosis not present

## 2017-04-23 ENCOUNTER — Other Ambulatory Visit: Payer: Self-pay | Admitting: Internal Medicine

## 2017-04-25 ENCOUNTER — Ambulatory Visit: Payer: Self-pay | Admitting: Psychology

## 2017-04-28 ENCOUNTER — Telehealth (HOSPITAL_COMMUNITY): Payer: Self-pay

## 2017-04-28 DIAGNOSIS — J301 Allergic rhinitis due to pollen: Secondary | ICD-10-CM | POA: Diagnosis not present

## 2017-04-28 DIAGNOSIS — J3089 Other allergic rhinitis: Secondary | ICD-10-CM | POA: Diagnosis not present

## 2017-04-28 NOTE — Telephone Encounter (Signed)
Medication refill request - Fax received from pt's [facility] for a refill of her prescribed Lorazepam 0.5 mg tablets, last filled 03/14/17 for #45 with no refills. Patient returns next on 06/23/17

## 2017-04-29 ENCOUNTER — Telehealth: Payer: Self-pay | Admitting: Internal Medicine

## 2017-04-29 ENCOUNTER — Other Ambulatory Visit (HOSPITAL_COMMUNITY): Payer: Self-pay

## 2017-04-29 DIAGNOSIS — F330 Major depressive disorder, recurrent, mild: Secondary | ICD-10-CM

## 2017-04-29 MED ORDER — LORAZEPAM 0.5 MG PO TABS: ORAL_TABLET | ORAL | 1 refills | Status: DC

## 2017-04-29 NOTE — Telephone Encounter (Signed)
Request denied. Dr. Adele Schilder has phoned this in for patient.

## 2017-04-29 NOTE — Telephone Encounter (Signed)
Copied from [place] [phone]. Topic: Quick Communication - Rx Refill/Question >> Apr 29, 2017  1:32 PM Oliver Pila B wrote: Pt called b/c another doctor is not available and is wanting to know if she can get Dr. Alain Marion to prescribe her  LORazepam (ATIVAN) 0.5 MG tablet [[id number]], contact pt to advise

## 2017-05-02 DIAGNOSIS — J301 Allergic rhinitis due to pollen: Secondary | ICD-10-CM | POA: Diagnosis not present

## 2017-05-02 DIAGNOSIS — J3089 Other allergic rhinitis: Secondary | ICD-10-CM | POA: Diagnosis not present

## 2017-05-02 DIAGNOSIS — M65322 Trigger finger, left index finger: Secondary | ICD-10-CM | POA: Diagnosis not present

## 2017-05-02 NOTE — Telephone Encounter (Signed)
Please call patient if she need refills on lorazepam.  She does not want to be auto refill from the pharmacy.  If she is requesting lorazepam we can provide until she can see me on her appointment.

## 2017-05-03 ENCOUNTER — Telehealth: Payer: Self-pay | Admitting: *Deleted

## 2017-05-03 ENCOUNTER — Encounter: Payer: Self-pay | Admitting: Internal Medicine

## 2017-05-03 NOTE — Telephone Encounter (Signed)
Patient called me last week and I refilled her until her appointment, she was due.

## 2017-05-03 NOTE — Telephone Encounter (Signed)
Ask her if she would like to try a nasal pillow mask which is less claustraphobic

## 2017-05-03 NOTE — Telephone Encounter (Signed)
Patient called today and wanted Dr Radford Pax to know that she has moved to another apartment complex and she is having trouble adjusting to her surroundings and using her CPAP. The unit is smaller and she feels claustrophobic and feels as though she is suffocating when she wears her CPAP. Patient states she has called [facility] and has an appointment with Nevin Bloodgood to troubleshoot her problem before she falls into noncompliance with her insurance.

## 2017-05-03 NOTE — Progress Notes (Signed)
Outside notes received. Information abstracted. Notes sent to scan.  

## 2017-05-09 ENCOUNTER — Ambulatory Visit: Payer: Self-pay | Admitting: Psychology

## 2017-05-09 NOTE — Telephone Encounter (Signed)
Reached out to the patient and she states she has an appointment with [facility] Nevin Bloodgood) on Tuesday May 10 2017 and she will ask for the nasal pillow at that appointment.[name]

## 2017-05-10 DIAGNOSIS — J301 Allergic rhinitis due to pollen: Secondary | ICD-10-CM | POA: Diagnosis not present

## 2017-05-10 DIAGNOSIS — J3089 Other allergic rhinitis: Secondary | ICD-10-CM | POA: Diagnosis not present

## 2017-05-11 ENCOUNTER — Telehealth: Payer: Self-pay | Admitting: Internal Medicine

## 2017-05-11 NOTE — Telephone Encounter (Signed)
Spoke with pt, she states she receives her allergy injections from [place] but she would like to receive her allergy shots from here. AC advised me that we do not give allergy shots anymore. Pt advised and nothing further is needed.

## 2017-05-16 DIAGNOSIS — J3089 Other allergic rhinitis: Secondary | ICD-10-CM | POA: Diagnosis not present

## 2017-05-16 DIAGNOSIS — J301 Allergic rhinitis due to pollen: Secondary | ICD-10-CM | POA: Diagnosis not present

## 2017-05-16 DIAGNOSIS — M65322 Trigger finger, left index finger: Secondary | ICD-10-CM | POA: Diagnosis not present

## 2017-05-23 ENCOUNTER — Ambulatory Visit (INDEPENDENT_AMBULATORY_CARE_PROVIDER_SITE_OTHER): Payer: Medicare Other | Admitting: Psychology

## 2017-05-23 DIAGNOSIS — J3089 Other allergic rhinitis: Secondary | ICD-10-CM | POA: Diagnosis not present

## 2017-05-23 DIAGNOSIS — J301 Allergic rhinitis due to pollen: Secondary | ICD-10-CM | POA: Diagnosis not present

## 2017-05-23 DIAGNOSIS — F332 Major depressive disorder, recurrent severe without psychotic features: Secondary | ICD-10-CM | POA: Diagnosis not present

## 2017-06-03 ENCOUNTER — Encounter: Payer: Self-pay | Admitting: Internal Medicine

## 2017-06-03 ENCOUNTER — Ambulatory Visit (INDEPENDENT_AMBULATORY_CARE_PROVIDER_SITE_OTHER): Payer: Medicare Other | Admitting: Internal Medicine

## 2017-06-03 DIAGNOSIS — I495 Sick sinus syndrome: Secondary | ICD-10-CM

## 2017-06-03 DIAGNOSIS — I480 Paroxysmal atrial fibrillation: Secondary | ICD-10-CM

## 2017-06-03 LAB — CUP PACEART INCLINIC DEVICE CHECK
Battery Impedance: 3017 Ohm
Brady Statistic AP VP Percent: 7 %
Brady Statistic AP VS Percent: 84 %
Brady Statistic AS VS Percent: 7 %
Date Time Interrogation Session: 20190208152944
Implantable Lead Implant Date: 20090527
Implantable Lead Location: 753860
Implantable Lead Model: 5076
Lead Channel Impedance Value: 531 Ohm
Lead Channel Pacing Threshold Amplitude: 0.75 V
Lead Channel Pacing Threshold Amplitude: 0.75 V
Lead Channel Pacing Threshold Pulse Width: 0.4 ms
Lead Channel Pacing Threshold Pulse Width: 0.4 ms
Lead Channel Sensing Intrinsic Amplitude: 5.6 mV
Lead Channel Setting Pacing Amplitude: 2 V
Lead Channel Setting Pacing Amplitude: 2.5 V
Lead Channel Setting Pacing Pulse Width: 0.4 ms
Lead Channel Setting Sensing Sensitivity: 2 mV
MDC IDC LEAD IMPLANT DT: 20090527
MDC IDC LEAD LOCATION: 753859
MDC IDC MSMT BATTERY REMAINING LONGEVITY: 20 mo
MDC IDC MSMT BATTERY VOLTAGE: 2.72 V
MDC IDC MSMT LEADCHNL RA IMPEDANCE VALUE: 531 Ohm
MDC IDC MSMT LEADCHNL RA PACING THRESHOLD AMPLITUDE: 0.625 V
MDC IDC MSMT LEADCHNL RA PACING THRESHOLD PULSEWIDTH: 0.4 ms
MDC IDC MSMT LEADCHNL RA SENSING INTR AMPL: 4 mV
MDC IDC MSMT LEADCHNL RV PACING THRESHOLD AMPLITUDE: 0.5 V
MDC IDC MSMT LEADCHNL RV PACING THRESHOLD PULSEWIDTH: 0.4 ms
MDC IDC PG IMPLANT DT: 20090527
MDC IDC STAT BRADY AS VP PERCENT: 1 %

## 2017-06-03 NOTE — Patient Instructions (Addendum)
Medication Instructions:  Your physician recommends that you continue on your current medications as directed. Please refer to the Current Medication list given to you today.  Labwork: None ordered.  Testing/Procedures: None ordered.  Follow-Up: Your physician recommends that you schedule a follow-up appointment in: One Year with Dr Caryl Comes  Remote monitoring is used to monitor your Pacemaker from home. This monitoring reduces the number of office visits required to check your device to one time per year. It allows [place] to keep an eye on the functioning of your device to ensure it is working properly. You are scheduled for a device check from home on 07/07/2017. You may send your transmission at any time that day. If you have a wireless device, the transmission will be sent automatically. After your physician reviews your transmission, you will receive a postcard with your next transmission date.  Any Other Special Instructions Will Be Listed Below (If Applicable).     If you need a refill on your cardiac medications before your next appointment, please call your pharmacy.

## 2017-06-03 NOTE — Progress Notes (Signed)
Patient Care Team: Plotnikov, Evie Lacks, MD as PCP - General Arfeen, Arlyce Harman, MD (Psychiatry) Richmond Campbell, MD (Gastroenterology) Fay Records, MD (Cardiology) Johnathan Hausen, MD (General Surgery) Melrose Nakayama, MD as Consulting Physician (Orthopedic Surgery) Larey Dresser, MD as Consulting Physician (Cardiology)   HPI  [name] is a 84 y.o. female seen in followup for orthostatic intolerance, exercise intolerance, and prior pacemaker implantation for sinus node dysfunction.   . Stress echo 2012 normal LV function.   Echo (7/17): EF 55-60%, mild MR with mitral valve prolapse, moderate TR, normal RV size and systolic function, PASP 30 mmHg.  -Lexiscan Cardiolite (9/17) with EF 55%, no ischemia/infarction - RHC (10/17) with mean RA 6, PA 28/9, mean PCWP 7, CI 2.37  She has had no interval syncope.  She has no orthopnea or nocturnal dyspnea.  She has chronic dyspnea on exertion.  She has recently moved to independent living.  She is struggling with control with her daughter.       Past Medical History:  Diagnosis Date  . Anxiety   . Bradycardia   . Breast cancer, left breast ([place]) 1995  . Breast cancer, right breast ([place]) 2002  . CHF (congestive heart failure) ([place])   . Depression    Dr Cheryln Manly  . Discoid lupus    skin  . Full dentures   . GERD (gastroesophageal reflux disease)   . Glaucoma   . Gout   . Hx of echocardiogram    a.  Echocardiogram (03/2011): Mild LVH, EF 21-30%, grade 1 diastolic dysfunction, mild MR, mild to moderate TR, PASP 42;  b. Echo (05/2013):  Mod LVH, EF 65-70%, no WMA, Gr 1 DD, mild MR, mod TR, PASP 39  . Hx of exercise stress test    a. ETT-echocardiogram (06/08/11): Sub-optimal exercise. Test stopped early due to dizziness and hypotension. EF 60%.  Non-diagnostic.;   b. Lexiscan Myoview (06/2013):  Diaphragmatic attenuation, no ischemia, EF 61%.  Low Risk  . Hypertension   . Hypothyroidism   . IBS (irritable bowel  syndrome)   . IBS (irritable bowel syndrome)   . Incontinence   . OSA (obstructive sleep apnea) 07/20/2016   Mild with AHI 12/hr now on CPAP  . OSA on CPAP    "quit wearing mask; retested; not enough information; to be tested again 04/08/2016"  . Osteoarthritis   . Osteopenia   . Pacemaker 2009   Brady//Chronotropic incompetence with normal pacemaker function  . Thyroid disease   . Wears hearing aid    both ears    Past Surgical History:  Procedure Laterality Date  . BREAST BIOPSY Left 1995  . BREAST LUMPECTOMY Left 1995   axillary node dissectoin  . CARDIAC CATHETERIZATION N/A 02/13/2016   Procedure: Right Heart Cath;  Surgeon: Larey Dresser, MD;  Location: [place] [facility];  Service: Cardiovascular;  Laterality: N/A;  . CATARACT EXTRACTION W/ INTRAOCULAR LENS  IMPLANT, BILATERAL Bilateral 06/2009 - 12.2915   left - right  . DORSAL COMPARTMENT RELEASE  2000   left  . HAMMER TOE SURGERY Left 2004  . HEMORRHOID BANDING    . INSERT / REPLACE / REMOVE PACEMAKER  2009  . KNEE ARTHROSCOPY Left 08/29/2012   Procedure: LEFT KNEE ARTHROSCOPY ;  Surgeon: Hessie Dibble, MD;  Location: [place];  Service: Orthopedics;  Laterality: Left;  [name] MASTECTOMY Bilateral 09/2000   nbx  . TRIGGER FINGER RELEASE Right 07/31/2013   Procedure: EXCISE MASS  RIGHT INDEX A-1 PLLLEY RELEASE A-1 RIGHT INDEX;  Surgeon: Cammie Sickle., MD;  Location: [place];  Service: Orthopedics;  Laterality: Right;  [name] VAGINAL HYSTERECTOMY      Current Outpatient Medications  Medication Sig Dispense Refill  . allopurinol (ZYLOPRIM) 300 MG tablet Take 1 tablet (300 mg total) by mouth daily. 30 tablet 5  . cetirizine (ZYRTEC) 10 MG tablet Take 10 mg by mouth daily as needed for allergies.    [name] escitalopram (LEXAPRO) 10 MG tablet TAKE 1 TABLET (10 MG) BY MOUTH DAILY AT BEDTIME. 90 tablet 0  . feeding supplement, ENSURE ENLIVE, (ENSURE ENLIVE) LIQD Take 237 mLs by mouth 2 (two) times  daily between meals. 237 mL 12  . KLOR-CON M20 20 MEQ tablet TAKE 1 TABLET BY MOUTH EVERY DAY 30 tablet 3  . levothyroxine (SYNTHROID, LEVOTHROID) 100 MCG tablet TAKE 1 TABLET (100 MCG TOTAL) BY MOUTH DAILY. 90 tablet 3  . LORazepam (ATIVAN) 0.5 MG tablet Take 1 tab daily and 2nd as needed 45 tablet 1  . Magnesium Oxide 200 MG TABS Take 1 tablet (200 mg total) by mouth daily. 30 tablet 6  . oxybutynin (DITROPAN) 5 MG tablet TAKE 1 TABLET (5 MG TOTAL) BY MOUTH DAILY AS NEEDED. 30 tablet 3  . psyllium (METAMUCIL SMOOTH TEXTURE) 28 % packet Take 1 packet by mouth daily with breakfast.    . ranitidine (ZANTAC) 150 MG tablet Take 150 mg by mouth 3 (three) times daily.    [name] REFRESH TEARS 0.5 % SOLN 1-2 DROPS IN [facility] EYE TWICE DAILY  0  . spironolactone (ALDACTONE) 25 MG tablet TAKE 1 TABLET (25 MG TOTAL) BY MOUTH DAILY. 30 tablet 11  . Turmeric 500 MG TABS Take 1 tablet by mouth 2 (two) times daily. 100 tablet 3   No current facility-administered medications for this visit.     Allergies  Allergen Reactions  . Lactase Diarrhea  . Amlodipine Besy-Benazepril Hcl Swelling  . Amlodipine Besylate Swelling  . Clonidine Hydrochloride     REACTION: tired  . Lactose Intolerance (Gi) Other (See Comments)  . Valsartan Other (See Comments)    Tongue swelling   . Verapamil     Review of Systems negative except from HPI and PMH  Physical Exam BP 124/76   Pulse 78   Ht 5' 3.5" (1.613 m)   Wt 173 lb (78.5 kg)   BMI 30.16 kg/m   Well developed and nourished in no acute distress HENT normal Neck supple with JVP-flat Clear Regular rate and rhythm, no murmurs or gallops Abd-soft with active BS No Clubbing cyanosis edema Skin-warm and dry A & Oriented  Grossly normal sensory and motor function   ECG   atrial pacing at 78 Interval 22/08/38  Assessment and  Plan  Sinus node dysfunction   Pacemaker  [employer]   Hypertension     Depression   Exercise  function remains poor but is  stable.  The biggest issues seem to be related to the conflicts with her daughter with her recent move to independent living, her struggles to maintain her relationship with her church and chronic depression.  Euvolemic continue current meds  We spent more than 50% of our >25 min visit in face to face counseling regarding the above

## 2017-06-06 ENCOUNTER — Ambulatory Visit: Payer: Self-pay | Admitting: Psychology

## 2017-06-06 DIAGNOSIS — J301 Allergic rhinitis due to pollen: Secondary | ICD-10-CM | POA: Diagnosis not present

## 2017-06-06 DIAGNOSIS — J3089 Other allergic rhinitis: Secondary | ICD-10-CM | POA: Diagnosis not present

## 2017-06-07 ENCOUNTER — Telehealth: Payer: Self-pay | Admitting: *Deleted

## 2017-06-07 NOTE — Telephone Encounter (Signed)
RE: sleep study  Charlie Pitter, PA-C  Freada Bergeron, CMA        I have never seen this patient or been involved in her care, so I'm not sure where this request came from?     ----- Message -----  From: Freada Bergeron, CMA  Sent: 05/10/2017  3:55 PM  To: Charlie Pitter, PA-C  Subject: sleep study                    Hello Dayna, just want to make sure that you wanted to order a sleep study for this patient because she has already had 2 and Dr Radford Pax is working on getting her the right mask at this time.

## 2017-06-07 NOTE — Telephone Encounter (Signed)
RE: pre cert  Lula Olszewski, CMA        Okay to schedule. Let me know when scheduled and I will add precert info.  Thanks,  Amy     ----- Message -----  From: Freada Bergeron, CMA  Sent: 05/03/2017  4:42 PM  To: [facility]  Subject: pre cert                     ---- Message from Bobby Rumpf, [place] sent at 03/10/2017 10:28 AM EST -----  Regarding: sleep study  Per Melina Copa pt to hav a sleep study  Orders are in  DX: HTN and Snoring  ESS=13

## 2017-06-15 ENCOUNTER — Other Ambulatory Visit (HOSPITAL_COMMUNITY): Payer: Self-pay | Admitting: *Deleted

## 2017-06-15 DIAGNOSIS — J301 Allergic rhinitis due to pollen: Secondary | ICD-10-CM | POA: Diagnosis not present

## 2017-06-15 DIAGNOSIS — J3089 Other allergic rhinitis: Secondary | ICD-10-CM | POA: Diagnosis not present

## 2017-06-15 MED ORDER — MAGNESIUM OXIDE -MG SUPPLEMENT 200 MG PO TABS: 200.0000 mg | ORAL_TABLET | Freq: Every day | ORAL | 6 refills | Status: DC

## 2017-06-16 DIAGNOSIS — M65322 Trigger finger, left index finger: Secondary | ICD-10-CM | POA: Diagnosis not present

## 2017-06-20 ENCOUNTER — Ambulatory Visit (INDEPENDENT_AMBULATORY_CARE_PROVIDER_SITE_OTHER): Payer: Medicare Other | Admitting: Psychology

## 2017-06-20 DIAGNOSIS — F332 Major depressive disorder, recurrent severe without psychotic features: Secondary | ICD-10-CM | POA: Diagnosis not present

## 2017-06-20 DIAGNOSIS — M65322 Trigger finger, left index finger: Secondary | ICD-10-CM | POA: Diagnosis not present

## 2017-06-22 ENCOUNTER — Encounter: Payer: Self-pay | Admitting: Internal Medicine

## 2017-06-22 ENCOUNTER — Ambulatory Visit (INDEPENDENT_AMBULATORY_CARE_PROVIDER_SITE_OTHER): Payer: Medicare Other | Admitting: Internal Medicine

## 2017-06-22 DIAGNOSIS — F411 Generalized anxiety disorder: Secondary | ICD-10-CM | POA: Diagnosis not present

## 2017-06-22 DIAGNOSIS — E032 Hypothyroidism due to medicaments and other exogenous substances: Secondary | ICD-10-CM

## 2017-06-22 DIAGNOSIS — N183 Chronic kidney disease, stage 3 unspecified: Secondary | ICD-10-CM

## 2017-06-22 DIAGNOSIS — F320 Major depressive disorder, single episode, mild: Secondary | ICD-10-CM | POA: Diagnosis not present

## 2017-06-22 MED ORDER — TOLTERODINE TARTRATE ER 4 MG PO CP24: 4.0000 mg | ORAL_CAPSULE | Freq: Every day | ORAL | 11 refills | Status: DC

## 2017-06-22 NOTE — Assessment & Plan Note (Signed)
Levothroid 

## 2017-06-22 NOTE — Patient Instructions (Signed)
MC well w/Jill 

## 2017-06-22 NOTE — Assessment & Plan Note (Signed)
Lorazepam ° °

## 2017-06-22 NOTE — Assessment & Plan Note (Signed)
Monitoring labs 

## 2017-06-22 NOTE — Assessment & Plan Note (Signed)
Dr Cheryln Manly q 2 weeks Lexapro

## 2017-06-22 NOTE — Progress Notes (Signed)
Subjective:  Patient ID: [name], female    DOB: December 15, 1929  Age: 84 y.o. MRN: [id number]  CC: No chief complaint on file.   HPI [name] presents for OAB, OA, LBP, allergies f/u Pt wants to see me in 6 weeks  Outpatient Medications Prior to Visit  Medication Sig Dispense Refill  . allopurinol (ZYLOPRIM) 300 MG tablet Take 1 tablet (300 mg total) by mouth daily. 30 tablet 5  . cetirizine (ZYRTEC) 10 MG tablet Take 10 mg by mouth daily as needed for allergies.    [name] escitalopram (LEXAPRO) 10 MG tablet TAKE 1 TABLET (10 MG) BY MOUTH DAILY AT BEDTIME. 90 tablet 0  . feeding supplement, ENSURE ENLIVE, (ENSURE ENLIVE) LIQD Take 237 mLs by mouth 2 (two) times daily between meals. 237 mL 12  . KLOR-CON M20 20 MEQ tablet TAKE 1 TABLET BY MOUTH EVERY DAY 30 tablet 3  . levothyroxine (SYNTHROID, LEVOTHROID) 100 MCG tablet TAKE 1 TABLET (100 MCG TOTAL) BY MOUTH DAILY. 90 tablet 3  . LORazepam (ATIVAN) 0.5 MG tablet Take 1 tab daily and 2nd as needed 45 tablet 1  . Magnesium Oxide 200 MG TABS Take 1 tablet (200 mg total) by mouth daily. 90 tablet 6  . oxybutynin (DITROPAN) 5 MG tablet TAKE 1 TABLET (5 MG TOTAL) BY MOUTH DAILY AS NEEDED. 30 tablet 3  . psyllium (METAMUCIL SMOOTH TEXTURE) 28 % packet Take 1 packet by mouth daily with breakfast.    . ranitidine (ZANTAC) 150 MG tablet Take 150 mg by mouth 3 (three) times daily.    [name] REFRESH TEARS 0.5 % SOLN 1-2 DROPS IN [facility] EYE TWICE DAILY  0  . spironolactone (ALDACTONE) 25 MG tablet TAKE 1 TABLET (25 MG TOTAL) BY MOUTH DAILY. 30 tablet 11  . Turmeric 500 MG TABS Take 1 tablet by mouth 2 (two) times daily. 100 tablet 3   No facility-administered medications prior to visit.     ROS Review of Systems  Constitutional: Negative for activity change, appetite change, chills, fatigue and unexpected weight change.  HENT: Negative for congestion, mouth sores and sinus pressure.   Eyes: Negative for visual disturbance.  Respiratory: Negative for  cough and chest tightness.   Gastrointestinal: Negative for abdominal pain and nausea.  Genitourinary: Positive for frequency and urgency. Negative for difficulty urinating and vaginal pain.  Musculoskeletal: Positive for arthralgias, back pain and gait problem.  Skin: Negative for pallor and rash.  Neurological: Negative for dizziness, tremors, weakness, numbness and headaches.  Psychiatric/Behavioral: Negative for confusion and sleep disturbance. The patient is nervous/anxious.     Objective:  BP 126/84 (BP Location: Left Arm, Patient Position: Sitting, Cuff Size: Normal)   Pulse 66   Temp 97.9 F (36.6 C) (Oral)   Ht 5' 3.5" (1.613 m)   Wt 164 lb (74.4 kg)   SpO2 99%   BMI 28.60 kg/m   BP Readings from Last 3 Encounters:  06/22/17 126/84  06/03/17 124/76  03/28/17 113/70    Wt Readings from Last 3 Encounters:  06/22/17 164 lb (74.4 kg)  06/03/17 173 lb (78.5 kg)  03/28/17 172 lb (78 kg)    Physical Exam  Constitutional: She appears well-developed. No distress.  HENT:  Head: Normocephalic.  Right Ear: External ear normal.  Left Ear: External ear normal.  Nose: Nose normal.  Mouth/Throat: Oropharynx is clear and moist.  Eyes: Conjunctivae are normal. Pupils are equal, round, and reactive to light. Right eye exhibits no discharge. Left eye exhibits no  discharge.  Neck: Normal range of motion. Neck supple. No JVD present. No tracheal deviation present. No thyromegaly present.  Cardiovascular: Normal rate, regular rhythm and normal heart sounds.  Pulmonary/Chest: No stridor. No respiratory distress. She has no wheezes.  Abdominal: Soft. Bowel sounds are normal. She exhibits no distension and no mass. There is no tenderness. There is no rebound and no guarding.  Musculoskeletal: She exhibits no edema or tenderness.  Lymphadenopathy:    She has no cervical adenopathy.  Neurological: She displays normal reflexes. No cranial nerve deficit. She exhibits normal muscle tone.  Coordination abnormal.  Skin: No rash noted. No erythema.  Psychiatric: She has a normal mood and affect. Her behavior is normal. Judgment and thought content normal.  cane  Lab Results  Component Value Date   WBC 4.6 08/06/2016   HGB 13.0 08/06/2016   HCT 39.4 08/06/2016   PLT 197.0 08/06/2016   GLUCOSE 81 03/28/2017   CHOL 174 05/11/2010   TRIG 78.0 05/11/2010   HDL 52.30 05/11/2010   LDLDIRECT 130.8 09/30/2006   LDLCALC 106 (H) 05/11/2010   ALT 11 08/06/2016   AST 17 08/06/2016   NA 139 03/28/2017   K 3.9 03/28/2017   CL 108 03/28/2017   CREATININE 1.09 (H) 03/28/2017   BUN 31 (H) 03/28/2017   CO2 25 03/28/2017   TSH 4.45 10/13/2016   INR 1.08 06/30/2016   HGBA1C 6.0 04/10/2012    No results found.  Assessment & Plan:   There are no diagnoses linked to this encounter. I am having [name] "Larri B. Sundquist" maintain her ranitidine, cetirizine, psyllium, feeding supplement (ENSURE ENLIVE), levothyroxine, allopurinol, REFRESH TEARS, Turmeric, escitalopram, spironolactone, KLOR-CON M20, oxybutynin, LORazepam, and Magnesium Oxide.  No orders of the defined types were placed in this encounter.    Follow-up: No Follow-up on file.  Walker Kehr, MD

## 2017-06-23 ENCOUNTER — Ambulatory Visit (HOSPITAL_COMMUNITY): Payer: Self-pay | Admitting: Psychiatry

## 2017-06-27 DIAGNOSIS — M65322 Trigger finger, left index finger: Secondary | ICD-10-CM | POA: Diagnosis not present

## 2017-06-29 DIAGNOSIS — H43813 Vitreous degeneration, bilateral: Secondary | ICD-10-CM | POA: Diagnosis not present

## 2017-06-29 DIAGNOSIS — H401231 Low-tension glaucoma, bilateral, mild stage: Secondary | ICD-10-CM | POA: Diagnosis not present

## 2017-07-04 ENCOUNTER — Ambulatory Visit: Payer: Self-pay | Admitting: Psychology

## 2017-07-07 ENCOUNTER — Ambulatory Visit (INDEPENDENT_AMBULATORY_CARE_PROVIDER_SITE_OTHER): Payer: Medicare Other | Admitting: *Deleted

## 2017-07-07 ENCOUNTER — Telehealth: Payer: Self-pay | Admitting: Cardiology

## 2017-07-07 DIAGNOSIS — I495 Sick sinus syndrome: Secondary | ICD-10-CM | POA: Diagnosis not present

## 2017-07-07 NOTE — Progress Notes (Signed)
Remote pacemaker transmission.   

## 2017-07-07 NOTE — Telephone Encounter (Signed)
Spoke with pt and reminded pt of remote transmission that is due today. Pt verbalized understanding.   

## 2017-07-08 ENCOUNTER — Encounter: Payer: Self-pay | Admitting: Cardiology

## 2017-07-13 DIAGNOSIS — J302 Other seasonal allergic rhinitis: Secondary | ICD-10-CM | POA: Diagnosis not present

## 2017-07-13 DIAGNOSIS — J301 Allergic rhinitis due to pollen: Secondary | ICD-10-CM | POA: Diagnosis not present

## 2017-07-18 ENCOUNTER — Ambulatory Visit (INDEPENDENT_AMBULATORY_CARE_PROVIDER_SITE_OTHER): Payer: Medicare Other | Admitting: Psychology

## 2017-07-18 DIAGNOSIS — F332 Major depressive disorder, recurrent severe without psychotic features: Secondary | ICD-10-CM

## 2017-07-20 DIAGNOSIS — M65322 Trigger finger, left index finger: Secondary | ICD-10-CM | POA: Diagnosis not present

## 2017-07-21 DIAGNOSIS — J301 Allergic rhinitis due to pollen: Secondary | ICD-10-CM | POA: Diagnosis not present

## 2017-07-21 DIAGNOSIS — J3089 Other allergic rhinitis: Secondary | ICD-10-CM | POA: Diagnosis not present

## 2017-07-21 LAB — CUP PACEART REMOTE DEVICE CHECK
Battery Impedance: 3213 Ohm
Brady Statistic AP VP Percent: 10 %
Brady Statistic AP VS Percent: 81 %
Brady Statistic AS VP Percent: 2 %
Brady Statistic AS VS Percent: 8 %
Date Time Interrogation Session: 20190314154603
Implantable Lead Implant Date: 20090527
Implantable Lead Location: 753860
Implantable Lead Model: 5076
Implantable Lead Model: 5076
Lead Channel Impedance Value: 515 Ohm
Lead Channel Impedance Value: 544 Ohm
Lead Channel Pacing Threshold Amplitude: 0.625 V
Lead Channel Pacing Threshold Pulse Width: 0.4 ms
Lead Channel Setting Pacing Amplitude: 2.5 V
MDC IDC LEAD IMPLANT DT: 20090527
MDC IDC LEAD LOCATION: 753859
MDC IDC MSMT BATTERY REMAINING LONGEVITY: 19 mo
MDC IDC MSMT BATTERY VOLTAGE: 2.71 V
MDC IDC MSMT LEADCHNL RV PACING THRESHOLD AMPLITUDE: 0.625 V
MDC IDC MSMT LEADCHNL RV PACING THRESHOLD PULSEWIDTH: 0.4 ms
MDC IDC PG IMPLANT DT: 20090527
MDC IDC SET LEADCHNL RA PACING AMPLITUDE: 2 V
MDC IDC SET LEADCHNL RV PACING PULSEWIDTH: 0.4 ms
MDC IDC SET LEADCHNL RV SENSING SENSITIVITY: 2 mV

## 2017-07-26 ENCOUNTER — Ambulatory Visit (HOSPITAL_COMMUNITY): Payer: Self-pay | Admitting: Psychiatry

## 2017-08-01 ENCOUNTER — Ambulatory Visit (INDEPENDENT_AMBULATORY_CARE_PROVIDER_SITE_OTHER): Payer: Medicare Other | Admitting: Psychiatry

## 2017-08-01 ENCOUNTER — Encounter (HOSPITAL_COMMUNITY): Payer: Self-pay | Admitting: Psychiatry

## 2017-08-01 DIAGNOSIS — F330 Major depressive disorder, recurrent, mild: Secondary | ICD-10-CM | POA: Diagnosis not present

## 2017-08-01 DIAGNOSIS — F419 Anxiety disorder, unspecified: Secondary | ICD-10-CM | POA: Diagnosis not present

## 2017-08-01 MED ORDER — LORAZEPAM 0.5 MG PO TABS: ORAL_TABLET | ORAL | 2 refills | Status: DC

## 2017-08-01 MED ORDER — ESCITALOPRAM OXALATE 10 MG PO TABS: ORAL_TABLET | ORAL | 1 refills | Status: DC

## 2017-08-01 NOTE — Progress Notes (Signed)
BH MD/PA/NP OP Progress Note  08/01/2017 12:48 PM [name]  MRN:  [id number]  Chief Complaint: I moved to independent living facility.  I really like it.  I dont have to drive.  HPI: Patient came for her follow-up appointment.  She moved to [facility] a independent living facility.  She is happy because she does not have to drive and do not have to climb stairs.  She got got an apartment and facility provide transportation to go to [place] and doctor's appointment.  Her own house is now rented.  She is pleased because she also made few friends.  She has shortness of breath and she gets easily tired.  She denies any crying spells or any feeling of hopelessness or worthlessness.  She takes Lexapro at bedtime and lorazepam as needed for severe anxiety.  Her energy level is low.  She denies any suicidal thoughts or homicidal thought.  She is seeing her primary care physician and Dr. Cheryln Manly every month for psychotherapy.  He has no tremors, shakes or any EPS.  She denies any paranoia or any hallucination.  Visit Diagnosis:    ICD-10-CM   1. Major depressive disorder, recurrent episode, mild (HCC) F33.0 LORazepam (ATIVAN) 0.5 MG tablet    escitalopram (LEXAPRO) 10 MG tablet    Past Psychiatric History: Reviewed Patient has one history of psychiatric inpatient treatment many years ago when she was admitted due to suicidal thinking. Patient denies any history of suicidal attempt or any psychosis.  Past Medical History:  Past Medical History:  Diagnosis Date  . Anxiety   . Bradycardia   . Breast cancer, left breast ([place]) 1995  . Breast cancer, right breast ([place]) 2002  . CHF (congestive heart failure) ([place])   . Depression    Dr Cheryln Manly  . Discoid lupus    skin  . Full dentures   . GERD (gastroesophageal reflux disease)   . Glaucoma   . Gout   . Hx of echocardiogram    a.  Echocardiogram (03/2011): Mild LVH, EF 08-65%, grade 1 diastolic dysfunction, mild MR, mild to moderate TR,  PASP 42;  b. Echo (05/2013):  Mod LVH, EF 65-70%, no WMA, Gr 1 DD, mild MR, mod TR, PASP 39  . Hx of exercise stress test    a. ETT-echocardiogram (06/08/11): Sub-optimal exercise. Test stopped early due to dizziness and hypotension. EF 60%.  Non-diagnostic.;   b. Lexiscan Myoview (06/2013):  Diaphragmatic attenuation, no ischemia, EF 61%.  Low Risk  . Hypertension   . Hypothyroidism   . IBS (irritable bowel syndrome)   . IBS (irritable bowel syndrome)   . Incontinence   . OSA (obstructive sleep apnea) 07/20/2016   Mild with AHI 12/hr now on CPAP  . OSA on CPAP    "quit wearing mask; retested; not enough information; to be tested again 04/08/2016"  . Osteoarthritis   . Osteopenia   . Pacemaker 2009   Brady//Chronotropic incompetence with normal pacemaker function  . Thyroid disease   . Wears hearing aid    both ears    Past Surgical History:  Procedure Laterality Date  . BREAST BIOPSY Left 1995  . BREAST LUMPECTOMY Left 1995   axillary node dissectoin  . CARDIAC CATHETERIZATION N/A 02/13/2016   Procedure: Right Heart Cath;  Surgeon: Larey Dresser, MD;  Location: [place] [facility];  Service: Cardiovascular;  Laterality: N/A;  . CATARACT EXTRACTION W/ INTRAOCULAR LENS  IMPLANT, BILATERAL Bilateral 06/2009 - 12.2915   left - right  .  DORSAL COMPARTMENT RELEASE  2000   left  . HAMMER TOE SURGERY Left 2004  . HEMORRHOID BANDING    . INSERT / REPLACE / REMOVE PACEMAKER  2009  . KNEE ARTHROSCOPY Left 08/29/2012   Procedure: LEFT KNEE ARTHROSCOPY ;  Surgeon: Hessie Dibble, MD;  Location: [place];  Service: Orthopedics;  Laterality: Left;  [name] MASTECTOMY Bilateral 09/2000   nbx  . TRIGGER FINGER RELEASE Right 07/31/2013   Procedure: EXCISE MASS RIGHT INDEX A-1 PLLLEY RELEASE A-1 RIGHT INDEX;  Surgeon: Cammie Sickle., MD;  Location: [place];  Service: Orthopedics;  Laterality: Right;  [name] VAGINAL HYSTERECTOMY      Family Psychiatric History:  Reviewed.  Family History:  Family History  Problem Relation Age of Onset  . Ovarian cancer Mother   . Heart disease Father   . Stroke Sister   . Stroke Brother   . Cancer Brother   . Heart disease Brother     Social History:  Social History   Socioeconomic History  . Marital status: Widowed    Spouse name: Not on file  . Number of children: Not on file  . Years of education: Not on file  . Highest education level: Not on file  Occupational History  . Occupation: retired  Scientific laboratory technician  . Financial resource strain: Not on file  . Food insecurity:    Worry: Not on file    Inability: Not on file  . Transportation needs:    Medical: Not on file    Non-medical: Not on file  Tobacco Use  . Smoking status: Never Smoker  . Smokeless tobacco: Never Used  Substance and Sexual Activity  . Alcohol use: No    Alcohol/week: 0.0 oz  . Drug use: No  . Sexual activity: Never  Lifestyle  . Physical activity:    Days per week: Not on file    Minutes per session: Not on file  . Stress: Not on file  Relationships  . Social connections:    Talks on phone: Not on file    Gets together: Not on file    Attends religious service: Not on file    Active member of club or organization: Not on file    Attends meetings of clubs or organizations: Not on file    Relationship status: Not on file  Other Topics Concern  . Not on file  Social History Narrative   She is a vegan now 2010    Allergies:  Allergies  Allergen Reactions  . Lactase Diarrhea  . Amlodipine Besy-Benazepril Hcl Swelling  . Amlodipine Besylate Swelling  . Clonidine Hydrochloride     REACTION: tired  . Lactose Intolerance (Gi) Other (See Comments)  . Valsartan Other (See Comments)    Tongue swelling   . Verapamil     Metabolic Disorder Labs: Lab Results  Component Value Date   HGBA1C 6.0 04/10/2012   No results found for: PROLACTIN Lab Results  Component Value Date   CHOL 174 05/11/2010   TRIG 78.0  05/11/2010   HDL 52.30 05/11/2010   CHOLHDL 3 05/11/2010   VLDL 15.6 05/11/2010   LDLCALC 106 (H) 05/11/2010   LDLCALC 105 (H) 10/14/2009   Lab Results  Component Value Date   TSH 4.45 10/13/2016   TSH 1.700 03/22/2016    Therapeutic Level Labs: No results found for: LITHIUM No results found for: VALPROATE No components found for:  CBMZ  Current Medications: Current Outpatient Medications  Medication Sig Dispense Refill  . allopurinol (ZYLOPRIM) 300 MG tablet Take 1 tablet (300 mg total) by mouth daily. 30 tablet 5  . cetirizine (ZYRTEC) 10 MG tablet Take 10 mg by mouth daily as needed for allergies.    [name] escitalopram (LEXAPRO) 10 MG tablet TAKE 1 TABLET (10 MG) BY MOUTH DAILY AT BEDTIME. 90 tablet 0  . feeding supplement, ENSURE ENLIVE, (ENSURE ENLIVE) LIQD Take 237 mLs by mouth 2 (two) times daily between meals. 237 mL 12  . KLOR-CON M20 20 MEQ tablet TAKE 1 TABLET BY MOUTH EVERY DAY 30 tablet 3  . levothyroxine (SYNTHROID, LEVOTHROID) 100 MCG tablet TAKE 1 TABLET (100 MCG TOTAL) BY MOUTH DAILY. 90 tablet 3  . LORazepam (ATIVAN) 0.5 MG tablet Take 1 tab daily and 2nd as needed 45 tablet 1  . Magnesium Oxide 200 MG TABS Take 1 tablet (200 mg total) by mouth daily. 90 tablet 6  . psyllium (METAMUCIL SMOOTH TEXTURE) 28 % packet Take 1 packet by mouth daily with breakfast.    . ranitidine (ZANTAC) 150 MG tablet Take 150 mg by mouth 3 (three) times daily.    [name] REFRESH TEARS 0.5 % SOLN 1-2 DROPS IN [facility] EYE TWICE DAILY  0  . spironolactone (ALDACTONE) 25 MG tablet TAKE 1 TABLET (25 MG TOTAL) BY MOUTH DAILY. 30 tablet 11  . tolterodine (DETROL LA) 4 MG 24 hr capsule Take 1 capsule (4 mg total) by mouth daily. 30 capsule 11  . Turmeric 500 MG TABS Take 1 tablet by mouth 2 (two) times daily. 100 tablet 3   No current facility-administered medications for this visit.      Musculoskeletal: Strength & Muscle Tone: within normal limits Gait & Station: normal Patient leans:  N/A  Psychiatric Specialty Exam: ROS  Weight 165 lb (74.8 kg).Body mass index is 28.77 kg/m.  General Appearance: Casual  Eye Contact:  Good  Speech:  Slow  Volume:  Normal  Mood:  Euthymic  Affect:  Congruent  Thought Process:  Goal Directed  Orientation:  Full (Time, Place, and Person)  Thought Content: Logical   Suicidal Thoughts:  No  Homicidal Thoughts:  No  Memory:  Immediate;   Good Recent;   Fair Remote;   Good  Judgement:  Good  Insight:  Good  Psychomotor Activity:  Decreased  Concentration:  Concentration: Fair and Attention Span: Fair  Recall:  Good  Fund of Knowledge: Good  Language: Good  Akathisia:  No  Handed:  Right  AIMS (if indicated): not done  Assets:  Communication Skills Desire for Improvement Housing Resilience Social Support  ADL's:  Intact  Cognition: WNL  Sleep:  Good   Screenings: PHQ2-9     Office Visit from 02/21/2017 in [place] Patient Outreach Telephone from 07/14/2016 in [place] Patient Outreach Telephone from 07/12/2016 in [place] Patient Outreach Telephone from 07/08/2016 in [place] Visit from 07/31/2014 in [place]  PHQ-2 Total Score  2  2  2  2  2   PHQ-9 Total Score  -  10  10  10   -       Assessment and Plan: Major depressive disorder, recurrent.  Anxiety disorder NOS.  Patient feeling better since she moved to independent living.  Facility is providing transportation and she do not have to drive.  Patient like to continue Lexapro every night and lorazepam only as needed for severe anxiety.  Encouraged to keep appointment  with Dr. Cheryln Manly for therapy.  Recommended to call us back if she has any question or any concern.  Patient like to follow-up in 6 months.  Return to office in 6 months.   Kathlee Nations, MD 08/01/2017, 12:48 PM

## 2017-08-08 ENCOUNTER — Other Ambulatory Visit (HOSPITAL_COMMUNITY): Payer: Self-pay | Admitting: Cardiology

## 2017-08-15 ENCOUNTER — Ambulatory Visit (INDEPENDENT_AMBULATORY_CARE_PROVIDER_SITE_OTHER): Payer: Medicare Other | Admitting: Psychology

## 2017-08-15 DIAGNOSIS — F332 Major depressive disorder, recurrent severe without psychotic features: Secondary | ICD-10-CM | POA: Diagnosis not present

## 2017-08-16 DIAGNOSIS — J301 Allergic rhinitis due to pollen: Secondary | ICD-10-CM | POA: Diagnosis not present

## 2017-08-16 DIAGNOSIS — J3089 Other allergic rhinitis: Secondary | ICD-10-CM | POA: Diagnosis not present

## 2017-08-22 ENCOUNTER — Other Ambulatory Visit: Payer: Self-pay | Admitting: Podiatry

## 2017-08-22 ENCOUNTER — Ambulatory Visit (INDEPENDENT_AMBULATORY_CARE_PROVIDER_SITE_OTHER): Payer: Medicare Other

## 2017-08-22 ENCOUNTER — Ambulatory Visit (INDEPENDENT_AMBULATORY_CARE_PROVIDER_SITE_OTHER): Payer: Medicare Other | Admitting: Podiatry

## 2017-08-22 ENCOUNTER — Encounter: Payer: Self-pay | Admitting: Podiatry

## 2017-08-22 DIAGNOSIS — L84 Corns and callosities: Secondary | ICD-10-CM | POA: Diagnosis not present

## 2017-08-22 DIAGNOSIS — M2042 Other hammer toe(s) (acquired), left foot: Secondary | ICD-10-CM | POA: Diagnosis not present

## 2017-08-22 DIAGNOSIS — R52 Pain, unspecified: Secondary | ICD-10-CM

## 2017-08-23 NOTE — Progress Notes (Signed)
HPI: 84 year old female presenting today as a new patient with a chief complaint of pain to the left fifth toe that has been ongoing for the past several years. He reports having a hammertoe correction performed of the toe several years ago. He has not done anything for treatment at home. Walking increases his pain. Patient is here for further evaluation and treatment.   Past Medical History:  Diagnosis Date  . Anxiety   . Bradycardia   . Breast cancer, left breast ([place]) 1995  . Breast cancer, right breast ([place]) 2002  . CHF (congestive heart failure) ([place])   . Depression    Dr Cheryln Manly  . Discoid lupus    skin  . Full dentures   . GERD (gastroesophageal reflux disease)   . Glaucoma   . Gout   . Hx of echocardiogram    a.  Echocardiogram (03/2011): Mild LVH, EF 22-29%, grade 1 diastolic dysfunction, mild MR, mild to moderate TR, PASP 42;  b. Echo (05/2013):  Mod LVH, EF 65-70%, no WMA, Gr 1 DD, mild MR, mod TR, PASP 39  . Hx of exercise stress test    a. ETT-echocardiogram (06/08/11): Sub-optimal exercise. Test stopped early due to dizziness and hypotension. EF 60%.  Non-diagnostic.;   b. Lexiscan Myoview (06/2013):  Diaphragmatic attenuation, no ischemia, EF 61%.  Low Risk  . Hypertension   . Hypothyroidism   . IBS (irritable bowel syndrome)   . IBS (irritable bowel syndrome)   . Incontinence   . OSA (obstructive sleep apnea) 07/20/2016   Mild with AHI 12/hr now on CPAP  . OSA on CPAP    "quit wearing mask; retested; not enough information; to be tested again 04/08/2016"  . Osteoarthritis   . Osteopenia   . Pacemaker 2009   Brady//Chronotropic incompetence with normal pacemaker function  . Thyroid disease   . Wears hearing aid    both ears      Objective: Physical Exam General: The patient is alert and oriented x3 in no acute distress.  Dermatology: Hyperkeratotic lesion present on the left fifth toe. Pain on palpation with a central nucleated core noted. Skin is cool,  dry and supple bilateral lower extremities. Negative for open lesions or macerations.  Vascular: Palpable pedal pulses bilaterally. No edema or erythema noted. Capillary refill within normal limits.  Neurological: Epicritic and protective threshold grossly intact bilaterally.   Musculoskeletal Exam: All pedal and ankle joints range of motion within normal limits bilateral. Muscle strength 5/5 in all groups bilateral. Hammertoe contracture deformity noted to the 5th digit of the left foot  Radiographic Exam: Hammertoe contracture deformity noted to the interphalangeal joints and MPJ of the respective hammertoe digits mentioned on clinical musculoskeletal exam.     Assessment: 1. Hammertoe 5th left 2. Corn 5th left   Plan of Care:  1. Patient evaluated. X-Rays reviewed.  2. Excisional debridement of keratoic lesion using a chisel blade was performed without incident. Light dressing applied.  3. May need surgery if there is no relief.  4. Continue wearing wide fitting shoes.  5. Return to clinic in 6 weeks.     Edrick Kins, DPM [facility]  Dr. Edrick Kins, DPM    [address]                                        [place], [place]  [place] [phone]  Fax [phone]

## 2017-08-30 ENCOUNTER — Other Ambulatory Visit: Payer: Self-pay | Admitting: Family Medicine

## 2017-08-30 DIAGNOSIS — M1A9XX0 Chronic gout, unspecified, without tophus (tophi): Secondary | ICD-10-CM

## 2017-08-30 DIAGNOSIS — J301 Allergic rhinitis due to pollen: Secondary | ICD-10-CM | POA: Diagnosis not present

## 2017-08-30 DIAGNOSIS — J3089 Other allergic rhinitis: Secondary | ICD-10-CM | POA: Diagnosis not present

## 2017-08-30 DIAGNOSIS — J3081 Allergic rhinitis due to animal (cat) (dog) hair and dander: Secondary | ICD-10-CM | POA: Diagnosis not present

## 2017-09-06 ENCOUNTER — Other Ambulatory Visit (HOSPITAL_COMMUNITY): Payer: Self-pay | Admitting: *Deleted

## 2017-09-06 MED ORDER — POTASSIUM CHLORIDE CRYS ER 20 MEQ PO TBCR: 20.0000 meq | EXTENDED_RELEASE_TABLET | Freq: Every day | ORAL | 2 refills | Status: DC

## 2017-09-12 ENCOUNTER — Ambulatory Visit (INDEPENDENT_AMBULATORY_CARE_PROVIDER_SITE_OTHER): Payer: Medicare Other | Admitting: Psychology

## 2017-09-12 DIAGNOSIS — F332 Major depressive disorder, recurrent severe without psychotic features: Secondary | ICD-10-CM

## 2017-09-13 ENCOUNTER — Telehealth: Payer: Self-pay | Admitting: Internal Medicine

## 2017-09-13 ENCOUNTER — Telehealth: Payer: Self-pay

## 2017-09-13 DIAGNOSIS — N3289 Other specified disorders of bladder: Secondary | ICD-10-CM

## 2017-09-13 NOTE — Telephone Encounter (Signed)
Copied from [place] [id number]. Topic: Referral - Request >> Sep 13, 2017  9:42 AM Arletha Grippe wrote: Reason for CRM: pt asking for urology referral due to frequent bathroom trips at night.  The new medication is not helping at all.  Cb is [phone]

## 2017-09-13 NOTE — Telephone Encounter (Signed)
Copied from [place] [phone]. Topic: Quick Communication - See Telephone Encounter >> Sep 13, 2017  9:46 AM Arletha Grippe wrote: CRM for notification. See Telephone encounter for: 09/13/17. Pt is having bad leg cramps at night, she thinks that her potassium is too low, would like to have increase in dose.  Please call [phone]

## 2017-09-14 NOTE — Telephone Encounter (Signed)
Take Tylenol PM at night for cramps.  We need to check her BMET. Her last potassium was within normal range Thanks

## 2017-09-14 NOTE — Telephone Encounter (Signed)
Ok Thx 

## 2017-09-14 NOTE — Telephone Encounter (Signed)
Please advise 

## 2017-09-15 NOTE — Telephone Encounter (Signed)
Pt.notified

## 2017-09-16 ENCOUNTER — Other Ambulatory Visit (HOSPITAL_COMMUNITY): Payer: Self-pay

## 2017-09-16 MED ORDER — MAGNESIUM OXIDE -MG SUPPLEMENT 200 MG PO TABS: 200.0000 mg | ORAL_TABLET | Freq: Every day | ORAL | 3 refills | Status: DC

## 2017-10-04 ENCOUNTER — Other Ambulatory Visit: Payer: Self-pay | Admitting: *Deleted

## 2017-10-04 DIAGNOSIS — M1A9XX0 Chronic gout, unspecified, without tophus (tophi): Secondary | ICD-10-CM

## 2017-10-04 MED ORDER — ALLOPURINOL 300 MG PO TABS: 300.0000 mg | ORAL_TABLET | Freq: Every day | ORAL | 5 refills | Status: DC

## 2017-10-05 ENCOUNTER — Ambulatory Visit (INDEPENDENT_AMBULATORY_CARE_PROVIDER_SITE_OTHER): Payer: Medicare Other | Admitting: Podiatry

## 2017-10-05 ENCOUNTER — Encounter: Payer: Self-pay | Admitting: Podiatry

## 2017-10-05 DIAGNOSIS — L84 Corns and callosities: Secondary | ICD-10-CM

## 2017-10-05 DIAGNOSIS — M2042 Other hammer toe(s) (acquired), left foot: Secondary | ICD-10-CM

## 2017-10-06 ENCOUNTER — Ambulatory Visit (INDEPENDENT_AMBULATORY_CARE_PROVIDER_SITE_OTHER): Payer: Medicare Other | Admitting: *Deleted

## 2017-10-06 ENCOUNTER — Encounter: Payer: Self-pay | Admitting: Cardiology

## 2017-10-06 DIAGNOSIS — I495 Sick sinus syndrome: Secondary | ICD-10-CM | POA: Diagnosis not present

## 2017-10-06 NOTE — Progress Notes (Signed)
Remote pacemaker transmission.   

## 2017-10-08 ENCOUNTER — Telehealth: Payer: Self-pay | Admitting: Internal Medicine

## 2017-10-10 ENCOUNTER — Ambulatory Visit (INDEPENDENT_AMBULATORY_CARE_PROVIDER_SITE_OTHER): Payer: Medicare Other | Admitting: Psychology

## 2017-10-10 DIAGNOSIS — F332 Major depressive disorder, recurrent severe without psychotic features: Secondary | ICD-10-CM | POA: Diagnosis not present

## 2017-10-11 LAB — CUP PACEART REMOTE DEVICE CHECK
Battery Remaining Longevity: 16 mo
Battery Voltage: 2.7 V
Brady Statistic AS VP Percent: 1 %
Date Time Interrogation Session: 20190613141509
Implantable Lead Implant Date: 20090527
Implantable Lead Implant Date: 20090527
Implantable Lead Location: 753860
Implantable Lead Model: 5076
Implantable Pulse Generator Implant Date: 20090527
Lead Channel Pacing Threshold Amplitude: 0.5 V
Lead Channel Pacing Threshold Pulse Width: 0.4 ms
Lead Channel Sensing Intrinsic Amplitude: 5.6 mV
Lead Channel Setting Pacing Amplitude: 2 V
Lead Channel Setting Pacing Amplitude: 2.5 V
Lead Channel Setting Pacing Pulse Width: 0.4 ms
MDC IDC LEAD LOCATION: 753859
MDC IDC MSMT BATTERY IMPEDANCE: 3555 Ohm
MDC IDC MSMT LEADCHNL RA IMPEDANCE VALUE: 564 Ohm
MDC IDC MSMT LEADCHNL RA PACING THRESHOLD AMPLITUDE: 0.625 V
MDC IDC MSMT LEADCHNL RV IMPEDANCE VALUE: 537 Ohm
MDC IDC MSMT LEADCHNL RV PACING THRESHOLD PULSEWIDTH: 0.4 ms
MDC IDC SET LEADCHNL RV SENSING SENSITIVITY: 2.8 mV
MDC IDC STAT BRADY AP VP PERCENT: 6 %
MDC IDC STAT BRADY AP VS PERCENT: 87 %
MDC IDC STAT BRADY AS VS PERCENT: 7 %

## 2017-10-14 ENCOUNTER — Telehealth (HOSPITAL_COMMUNITY): Payer: Self-pay | Admitting: *Deleted

## 2017-10-14 NOTE — Telephone Encounter (Signed)
Patient called requesting for an appointment to see Dr. Aundra Dubin.  She wasn't due for f/u appt until December 2019 but she is requested to be seen sooner.  Patient complaining of increased shortness of breath with very minimal activity.  She stated this has been going on for awhile but it wasn't urgent. She just wanted to go ahead and schedule a follow up.  I scheduled her to see Dr. Aundra Dubin but his next available wasn't until September.  She is agreeable to be seen in [facility] so I have scheduled her a follow up appt July 12th.  Patient stated that if she starts feeling worse between now and then she will call us back.  No further questions.

## 2017-10-15 NOTE — Progress Notes (Signed)
   HPI: 84 year old female presenting today for follow up evaluation of left 5th toe pain. She states she is doing much better. She denies any pain at this time. She denies any new complaints. Patient is here for further evaluation and treatment.   Past Medical History:  Diagnosis Date  . Anxiety   . Bradycardia   . Breast cancer, left breast ([place]) 1995  . Breast cancer, right breast ([place]) 2002  . CHF (congestive heart failure) ([place])   . Depression    Dr Cheryln Manly  . Discoid lupus    skin  . Full dentures   . GERD (gastroesophageal reflux disease)   . Glaucoma   . Gout   . Hx of echocardiogram    a.  Echocardiogram (03/2011): Mild LVH, EF 22-02%, grade 1 diastolic dysfunction, mild MR, mild to moderate TR, PASP 42;  b. Echo (05/2013):  Mod LVH, EF 65-70%, no WMA, Gr 1 DD, mild MR, mod TR, PASP 39  . Hx of exercise stress test    a. ETT-echocardiogram (06/08/11): Sub-optimal exercise. Test stopped early due to dizziness and hypotension. EF 60%.  Non-diagnostic.;   b. Lexiscan Myoview (06/2013):  Diaphragmatic attenuation, no ischemia, EF 61%.  Low Risk  . Hypertension   . Hypothyroidism   . IBS (irritable bowel syndrome)   . IBS (irritable bowel syndrome)   . Incontinence   . OSA (obstructive sleep apnea) 07/20/2016   Mild with AHI 12/hr now on CPAP  . OSA on CPAP    "quit wearing mask; retested; not enough information; to be tested again 04/08/2016"  . Osteoarthritis   . Osteopenia   . Pacemaker 2009   Brady//Chronotropic incompetence with normal pacemaker function  . Thyroid disease   . Wears hearing aid    both ears      Objective: Physical Exam General: The patient is alert and oriented x3 in no acute distress.  Dermatology: Hyperkeratotic lesion present on the left fifth toe. Pain on palpation with a central nucleated core noted. Skin is cool, dry and supple bilateral lower extremities. Negative for open lesions or macerations.  Vascular: Palpable pedal pulses  bilaterally. No edema or erythema noted. Capillary refill within normal limits.  Neurological: Epicritic and protective threshold grossly intact bilaterally.   Musculoskeletal Exam: All pedal and ankle joints range of motion within normal limits bilateral. Muscle strength 5/5 in all groups bilateral. Hammertoe contracture deformity noted to the 5th digit of the left foot  Assessment: 1. Hammertoe 5th left 2. Corn 5th left   Plan of Care:  1. Patient evaluated.  2. Excisional debridement of keratoic lesion using a chisel blade was performed without incident. Light dressing applied.  3. May need surgery if there is no relief.  4. Continue wearing wide fitting shoes.  5. Recommended Urea 40% cream.  6. Return to clinic in 3 months.     Edrick Kins, DPM [facility]  Dr. Edrick Kins, DPM    [address], [place] [address]                Office [phone]  Fax [phone]

## 2017-10-17 NOTE — Telephone Encounter (Signed)
Patient's script for levothyroxine (SYNTHROID, LEVOTHROID) 100 MCG tablet was sent to wrong pharm, please resend to  [facility], [place] [address]  Phone: [phone] Fax: [phone]

## 2017-10-19 DIAGNOSIS — J301 Allergic rhinitis due to pollen: Secondary | ICD-10-CM | POA: Diagnosis not present

## 2017-10-19 DIAGNOSIS — N3941 Urge incontinence: Secondary | ICD-10-CM | POA: Diagnosis not present

## 2017-10-19 DIAGNOSIS — J3089 Other allergic rhinitis: Secondary | ICD-10-CM | POA: Diagnosis not present

## 2017-10-19 DIAGNOSIS — R351 Nocturia: Secondary | ICD-10-CM | POA: Diagnosis not present

## 2017-10-19 DIAGNOSIS — R350 Frequency of micturition: Secondary | ICD-10-CM | POA: Diagnosis not present

## 2017-10-20 MED ORDER — LEVOTHYROXINE SODIUM 100 MCG PO TABS: 100.0000 ug | ORAL_TABLET | Freq: Every day | ORAL | 1 refills | Status: DC

## 2017-10-20 NOTE — Addendum Note (Signed)
Addended by: Karren Cobble on: 10/20/2017 11:57 AM   Modules accepted: Orders

## 2017-10-20 NOTE — Telephone Encounter (Signed)
RX sent

## 2017-10-24 DIAGNOSIS — J301 Allergic rhinitis due to pollen: Secondary | ICD-10-CM | POA: Diagnosis not present

## 2017-10-24 DIAGNOSIS — J3089 Other allergic rhinitis: Secondary | ICD-10-CM | POA: Diagnosis not present

## 2017-11-04 ENCOUNTER — Encounter (HOSPITAL_COMMUNITY): Payer: Self-pay

## 2017-11-07 ENCOUNTER — Ambulatory Visit (INDEPENDENT_AMBULATORY_CARE_PROVIDER_SITE_OTHER): Payer: Medicare Other | Admitting: Psychology

## 2017-11-07 DIAGNOSIS — F332 Major depressive disorder, recurrent severe without psychotic features: Secondary | ICD-10-CM | POA: Diagnosis not present

## 2017-11-16 DIAGNOSIS — J301 Allergic rhinitis due to pollen: Secondary | ICD-10-CM | POA: Diagnosis not present

## 2017-11-16 DIAGNOSIS — J3089 Other allergic rhinitis: Secondary | ICD-10-CM | POA: Diagnosis not present

## 2017-11-23 ENCOUNTER — Other Ambulatory Visit: Payer: Self-pay

## 2017-11-25 DIAGNOSIS — J3089 Other allergic rhinitis: Secondary | ICD-10-CM | POA: Diagnosis not present

## 2017-11-25 DIAGNOSIS — J301 Allergic rhinitis due to pollen: Secondary | ICD-10-CM | POA: Diagnosis not present

## 2017-11-28 ENCOUNTER — Ambulatory Visit: Payer: Self-pay

## 2017-11-28 NOTE — Telephone Encounter (Signed)
Pt. called with c/o several red blotches on right lower leg, since Saturday.  Described that the areas appear like a "burn."  Denied being out in the sun, over the weekend.   Stated "the red blotchy areas sting, but don't itch."  Stated there is no break in the skin; denied blistering of blotchy areas. Reported her "right foot and leg felt a little tight on Saturday"; denied any swelling of right LE today.  Stated her right foot "feels a little different"; unable to explain this.  Denied fever/ chills.  Appt. sched. for 8/6 @ 2:00 PM.  Care advice given per protocol.  Verbalized understanding.        Reason for Disposition . [1] Localized rash is very painful AND [2] no fever  Answer Assessment - Initial Assessment Questions 1. APPEARANCE of RASH: "Describe the rash."      Reddish-brown areas on right lower leg   2. LOCATION: "Where is the rash located?"      Right lower leg 3. NUMBER: "How many spots are there?"      About 6 4. SIZE: "How big are the spots?" (Inches, centimeters or compare to size of a coin)      Blotchy areas in different sizes  5. ONSET: "When did the rash start?"     Saturday 6. ITCHING: "Does the rash itch?" If so, ask: "How bad is the itch?"  (Scale 1-10; or mild, moderate, severe)     Denied itching  7. PAIN: "Does the rash hurt?" If so, ask: "How bad is the pain?"  (Scale 1-10; or mild, moderate, severe)     Feels "strange", like a sting; denied pain 8. OTHER SYMPTOMS: "Do you have any other symptoms?" (e.g., fever)     Denied fever / chills; denied open sores or break in skin; denied any spreading; right foot feels a little different  9. PREGNANCY: "Is there any chance you are pregnant?" "When was your last menstrual period?"     N/A  Protocols used: RASH OR REDNESS - LOCALIZED-A-AH  Message from Adelene Idler sent at 11/28/2017 3:18 PM EDT   Pt calling in stating she has red bumps that look like burns on her leg there was swelling but swelling has went  down a lot.

## 2017-11-29 ENCOUNTER — Other Ambulatory Visit (INDEPENDENT_AMBULATORY_CARE_PROVIDER_SITE_OTHER): Payer: Medicare Other

## 2017-11-29 ENCOUNTER — Ambulatory Visit (INDEPENDENT_AMBULATORY_CARE_PROVIDER_SITE_OTHER): Payer: Medicare Other | Admitting: Family

## 2017-11-29 ENCOUNTER — Encounter: Payer: Self-pay | Admitting: Family

## 2017-11-29 VITALS — BP 126/78 | HR 68 | Temp 97.7°F | Ht 63.5 in | Wt 174.0 lb

## 2017-11-29 DIAGNOSIS — R21 Rash and other nonspecific skin eruption: Secondary | ICD-10-CM

## 2017-11-29 DIAGNOSIS — E032 Hypothyroidism due to medicaments and other exogenous substances: Secondary | ICD-10-CM

## 2017-11-29 LAB — CBC WITH DIFFERENTIAL/PLATELET
Basophils Absolute: 0 10*3/uL (ref 0.0–0.1)
Basophils Relative: 0.8 % (ref 0.0–3.0)
EOS ABS: 0.1 10*3/uL (ref 0.0–0.7)
Eosinophils Relative: 2.6 % (ref 0.0–5.0)
HCT: 37.1 % (ref 36.0–46.0)
HEMOGLOBIN: 12.4 g/dL (ref 12.0–15.0)
LYMPHS PCT: 28 % (ref 12.0–46.0)
Lymphs Abs: 1 10*3/uL (ref 0.7–4.0)
MCHC: 33.4 g/dL (ref 30.0–36.0)
MCV: 103.6 fl — ABNORMAL HIGH (ref 78.0–100.0)
MONO ABS: 0.3 10*3/uL (ref 0.1–1.0)
Monocytes Relative: 9.4 % (ref 3.0–12.0)
NEUTROS PCT: 59.2 % (ref 43.0–77.0)
Neutro Abs: 2.2 10*3/uL (ref 1.4–7.7)
PLATELETS: 196 10*3/uL (ref 150.0–400.0)
RBC: 3.58 Mil/uL — AB (ref 3.87–5.11)
RDW: 14 % (ref 11.5–15.5)
WBC: 3.7 10*3/uL — AB (ref 4.0–10.5)

## 2017-11-29 LAB — COMPREHENSIVE METABOLIC PANEL
ALBUMIN: 4.2 g/dL (ref 3.5–5.2)
ALK PHOS: 61 U/L (ref 39–117)
ALT: 9 U/L (ref 0–35)
AST: 15 U/L (ref 0–37)
BUN: 23 mg/dL (ref 6–23)
CALCIUM: 9.9 mg/dL (ref 8.4–10.5)
CHLORIDE: 101 meq/L (ref 96–112)
CO2: 30 mEq/L (ref 19–32)
CREATININE: 1.06 mg/dL (ref 0.40–1.20)
GFR: 63.24 mL/min (ref >60.00)
Glucose, Bld: 85 mg/dL (ref 70–99)
POTASSIUM: 5 meq/L (ref 3.5–5.1)
Sodium: 137 mEq/L (ref 135–145)
TOTAL PROTEIN: 7.2 g/dL (ref 6.0–8.3)
Total Bilirubin: 0.7 mg/dL (ref 0.2–1.2)

## 2017-11-29 LAB — TSH: TSH: 1.09 u[IU]/mL (ref 0.35–4.50)

## 2017-11-29 NOTE — Progress Notes (Signed)
[name] is a 84 y.o. female with the following history as recorded in EpicCare:  Patient Active Problem List   Diagnosis Date Noted  . OSA (obstructive sleep apnea) 07/20/2016  . Sigmoid diverticulitis 07/01/2016  . Acute diverticulitis   . Rectal bleeding 06/30/2016  . Impacted cerumen of right ear 03/26/2016  . Syncope 03/22/2016  . Benign essential HTN 03/22/2016  . CAD (coronary artery disease) 03/22/2016  . CKD (chronic kidney disease) stage 3, GFR 30-59 ml/min (HCC) 03/22/2016  . Depression 03/22/2016  . Exertional dyspnea 11/04/2015  . Ascending aortic aneurysm ([place]) 11/04/2015  . 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 Arthralgia 10/10/2015  . Pulmonary hypertension ([place]) 09/30/2015  . Mild persistent asthma 09/30/2015  . Irritable larynx 09/04/2015  . Rotator cuff disorder 03/06/2015  . OCD (obsessive compulsive disorder) 04/03/2013  . IBS (irritable bowel syndrome) 07/03/2012  . Dysphagia 04/13/2012  . Denture irritation 12/20/2011  . Irritable bladder 12/20/2011  . Knee pain, bilateral 12/20/2011  . Hearing loss 10/11/2011  . Pacemaker-Medtr[employer] 05/18/2011  . Gout, unspecified 04/30/2010  . Anal fissure 02/03/2010  . Atrial fibrillation ([place]) 08/26/2009  . Sinoatrial node dysfunction ([place]) 09/17/2008  . HYPOTENSION, ORTHOSTATIC 01/29/2008  . Microscopic hematuria 08/21/2007  . Pain in joint, pelvic region and thigh 08/01/2007  . Vitamin D deficiency 05/23/2007  . Anxiety state 05/23/2007  . Hypothyroidism 03/22/2007  . GERD 03/22/2007  . Osteoarthritis 03/22/2007  . CFS (chronic fatigue syndrome) 11/17/2006  . Hypertensive heart disease 11/17/2006  . ALLERGIC RHINITIS 11/17/2006  . OSTEOPOROSIS 11/17/2006  . BREAST CANCER, HX OF 11/17/2006    Current Outpatient Medications  Medication Sig Dispense Refill  . allopurinol (ZYLOPRIM) 300 MG tablet Take 1 tablet (300 mg total) by mouth daily. 30 tablet 5  . cetirizine (ZYRTEC) 10 MG tablet Take 10 mg by mouth daily  as needed for allergies.    [name] escitalopram (LEXAPRO) 10 MG tablet TAKE 1 TABLET (10 MG) BY MOUTH DAILY AT BEDTIME. 90 tablet 1  . feeding supplement, ENSURE ENLIVE, (ENSURE ENLIVE) LIQD Take 237 mLs by mouth 2 (two) times daily between meals. 237 mL 12  . levothyroxine (SYNTHROID, LEVOTHROID) 100 MCG tablet Take 1 tablet (100 mcg total) by mouth daily. 90 tablet 1  . LORazepam (ATIVAN) 0.5 MG tablet Take 1 tab daily as needed for anxiety 30 tablet 2  . Magnesium Oxide 200 MG TABS Take 1 tablet (200 mg total) by mouth daily. 90 tablet 3  . mirabegron ER (MYRBETRIQ) 50 MG TB24 tablet Take 50 mg by mouth daily.    . potassium chloride SA (KLOR-CON M20) 20 MEQ tablet Take 1 tablet (20 mEq total) by mouth daily. 30 tablet 2  . psyllium (METAMUCIL SMOOTH TEXTURE) 28 % packet Take 1 packet by mouth daily with breakfast.    . ranitidine (ZANTAC) 150 MG tablet Take 150 mg by mouth 3 (three) times daily.    [name] REFRESH TEARS 0.5 % SOLN 1-2 DROPS IN [facility] EYE TWICE DAILY  0  . spironolactone (ALDACTONE) 25 MG tablet TAKE 1 TABLET (25 MG TOTAL) BY MOUTH DAILY. 30 tablet 11  . Turmeric 500 MG TABS Take 1 tablet by mouth 2 (two) times daily. 100 tablet 3   No current facility-administered medications for this visit.     Allergies: Lactase; Amlodipine besy-benazepril hcl; Amlodipine besylate; Clonidine hydrochloride; Lactose intolerance (gi); Valsartan; and Verapamil  Past Medical History:  Diagnosis Date  . Anxiety   . Bradycardia   . Breast cancer, left breast ([place])  1995  . Breast cancer, right breast ([place]) 2002  . CHF (congestive heart failure) ([place])   . Depression    Dr Cheryln Manly  . Discoid lupus    skin  . Full dentures   . GERD (gastroesophageal reflux disease)   . Glaucoma   . Gout   . Hx of echocardiogram    a.  Echocardiogram (03/2011): Mild LVH, EF 65-68%, grade 1 diastolic dysfunction, mild MR, mild to moderate TR, PASP 42;  b. Echo (05/2013):  Mod LVH, EF 65-70%, no WMA, Gr 1 DD, mild MR,  mod TR, PASP 39  . Hx of exercise stress test    a. ETT-echocardiogram (06/08/11): Sub-optimal exercise. Test stopped early due to dizziness and hypotension. EF 60%.  Non-diagnostic.;   b. Lexiscan Myoview (06/2013):  Diaphragmatic attenuation, no ischemia, EF 61%.  Low Risk  . Hypertension   . Hypothyroidism   . IBS (irritable bowel syndrome)   . IBS (irritable bowel syndrome)   . Incontinence   . OSA (obstructive sleep apnea) 07/20/2016   Mild with AHI 12/hr now on CPAP  . OSA on CPAP    "quit wearing mask; retested; not enough information; to be tested again 04/08/2016"  . Osteoarthritis   . Osteopenia   . Pacemaker 2009   Brady//Chronotropic incompetence with normal pacemaker function  . Thyroid disease   . Wears hearing aid    both ears    Past Surgical History:  Procedure Laterality Date  . BREAST BIOPSY Left 1995  . BREAST LUMPECTOMY Left 1995   axillary node dissectoin  . CARDIAC CATHETERIZATION N/A 02/13/2016   Procedure: Right Heart Cath;  Surgeon: Larey Dresser, MD;  Location: [facility];  Service: Cardiovascular;  Laterality: N/A;  . CATARACT EXTRACTION W/ INTRAOCULAR LENS  IMPLANT, BILATERAL Bilateral 06/2009 - 12.2915   left - right  . DORSAL COMPARTMENT RELEASE  2000   left  . HAMMER TOE SURGERY Left 2004  . HEMORRHOID BANDING    . INSERT / REPLACE / REMOVE PACEMAKER  2009  . KNEE ARTHROSCOPY Left 08/29/2012   Procedure: LEFT KNEE ARTHROSCOPY ;  Surgeon: Hessie Dibble, MD;  Location: [facility];  Service: Orthopedics;  Laterality: Left;  [name] MASTECTOMY Bilateral 09/2000   nbx  . TRIGGER FINGER RELEASE Right 07/31/2013   Procedure: EXCISE MASS RIGHT INDEX A-1 PLLLEY RELEASE A-1 RIGHT INDEX;  Surgeon: Cammie Sickle., MD;  Location: [facility];  Service: Orthopedics;  Laterality: Right;  [name] VAGINAL HYSTERECTOMY      Family History  Problem Relation Age of Onset  . Ovarian cancer Mother   . Heart disease Father   . Stroke  Sister   . Stroke Brother   . Cancer Brother   . Heart disease Brother     Social History   Tobacco Use  . Smoking status: Never Smoker  . Smokeless tobacco: Never Used  Substance Use Topics  . Alcohol use: No    Alcohol/week: 0.0 oz    Subjective:  Patient is participating in "balance therapy" through [facility]; she notes that people conducting her classes were concerned about localized area of redness on her shin; apparently area of concern was first noticed at the end of last week. Wanted to make sure not contagious; denies any pain or swelling at the site; notes that area of redness has actually improved over the past few days; denies any new soaps, foods, detergents or medications.  Also concerned about her weight  going up- would like to get thyroid level checked.      Objective:  Vitals:   11/29/17 1358  BP: 126/78  Pulse: 68  Temp: 97.7 F (36.5 C)  TempSrc: Oral  SpO2: 99%  Weight: 174 lb (78.9 kg)  Height: 5' 3.5" (1.613 m)    General: Well developed, well nourished, in no acute distress  Skin : Warm and dry.  Head: Normocephalic and atraumatic  Lungs: Respirations unlabored; clear to auscultation bilaterally without wheeze, rales, rhonchi  CVS exam: normal rate and regular rhythm.  Musculoskeletal: No deformities; no active joint inflammation  Extremities: No edema, cyanosis, clubbing  Vessels: Symmetric bilaterally  Neurologic: Alert and oriented; speech intact; face symmetrical; moves all extremities well; CNII-XII intact without focal deficit  Assessment:  1. Rash   2. Hypothyroidism due to medication     Plan:  1. Area of concern appears to be resolving on its own; will watch and wait for now; do not feel she is contagious. 2. Check CBC, CMP, TSH today;    No follow-ups on file.  Orders Placed This Encounter  Procedures  . CBC w/Diff    Standing Status:   Future    Number of Occurrences:   1    Standing Expiration Date:   11/29/2018  . Comp Met (CMET)     Standing Status:   Future    Number of Occurrences:   1    Standing Expiration Date:   11/29/2018  . TSH    Standing Status:   Future    Number of Occurrences:   1    Standing Expiration Date:   11/29/2018    Requested Prescriptions    No prescriptions requested or ordered in this encounter

## 2017-11-30 DIAGNOSIS — J3089 Other allergic rhinitis: Secondary | ICD-10-CM | POA: Diagnosis not present

## 2017-11-30 DIAGNOSIS — J301 Allergic rhinitis due to pollen: Secondary | ICD-10-CM | POA: Diagnosis not present

## 2017-12-05 ENCOUNTER — Ambulatory Visit (INDEPENDENT_AMBULATORY_CARE_PROVIDER_SITE_OTHER): Payer: Medicare Other | Admitting: Psychology

## 2017-12-05 DIAGNOSIS — F332 Major depressive disorder, recurrent severe without psychotic features: Secondary | ICD-10-CM

## 2017-12-06 ENCOUNTER — Telehealth: Payer: Self-pay | Admitting: Internal Medicine

## 2017-12-06 ENCOUNTER — Encounter: Payer: Self-pay | Admitting: Family

## 2017-12-06 DIAGNOSIS — E032 Hypothyroidism due to medicaments and other exogenous substances: Secondary | ICD-10-CM

## 2017-12-06 NOTE — Telephone Encounter (Signed)
Who would she like to see? Thx

## 2017-12-06 NOTE — Telephone Encounter (Signed)
Please advise about referral 

## 2017-12-06 NOTE — Telephone Encounter (Signed)
Copied from [place] [phone]. Topic: Quick Communication - See Telephone Encounter >> Dec 06, 2017  9:29 AM Burchel, Abbi R wrote: CRM for notification. See Telephone encounter for: 12/06/17.  Pt requesting referral to Endo for low thyroid levels.  Please call pt.  Pt: [phone]

## 2017-12-07 ENCOUNTER — Other Ambulatory Visit: Payer: Self-pay | Admitting: Family

## 2017-12-07 DIAGNOSIS — R635 Abnormal weight gain: Secondary | ICD-10-CM

## 2017-12-07 NOTE — Telephone Encounter (Signed)
Ok. Done thx 

## 2017-12-07 NOTE — Telephone Encounter (Signed)
Called pt she states it does not matter who she see..She does not know any endocrinologist, but she is needing someone to treat this disease because she feel bad.[name] KitchenJohny Chess

## 2017-12-08 ENCOUNTER — Encounter: Payer: Self-pay | Admitting: Family

## 2017-12-08 ENCOUNTER — Other Ambulatory Visit: Payer: Self-pay | Admitting: Family

## 2017-12-08 DIAGNOSIS — R635 Abnormal weight gain: Secondary | ICD-10-CM

## 2017-12-09 DIAGNOSIS — J3089 Other allergic rhinitis: Secondary | ICD-10-CM | POA: Diagnosis not present

## 2017-12-09 DIAGNOSIS — J301 Allergic rhinitis due to pollen: Secondary | ICD-10-CM | POA: Diagnosis not present

## 2017-12-12 DIAGNOSIS — J301 Allergic rhinitis due to pollen: Secondary | ICD-10-CM | POA: Diagnosis not present

## 2017-12-14 DIAGNOSIS — J3089 Other allergic rhinitis: Secondary | ICD-10-CM | POA: Diagnosis not present

## 2017-12-14 DIAGNOSIS — J301 Allergic rhinitis due to pollen: Secondary | ICD-10-CM | POA: Diagnosis not present

## 2017-12-15 DIAGNOSIS — N3941 Urge incontinence: Secondary | ICD-10-CM | POA: Diagnosis not present

## 2017-12-15 DIAGNOSIS — R351 Nocturia: Secondary | ICD-10-CM | POA: Diagnosis not present

## 2017-12-19 DIAGNOSIS — J301 Allergic rhinitis due to pollen: Secondary | ICD-10-CM | POA: Diagnosis not present

## 2017-12-19 DIAGNOSIS — J3089 Other allergic rhinitis: Secondary | ICD-10-CM | POA: Diagnosis not present

## 2017-12-20 ENCOUNTER — Ambulatory Visit: Payer: Self-pay | Admitting: Internal Medicine

## 2017-12-21 ENCOUNTER — Ambulatory Visit (INDEPENDENT_AMBULATORY_CARE_PROVIDER_SITE_OTHER): Payer: Medicare Other | Admitting: Internal Medicine

## 2017-12-21 ENCOUNTER — Other Ambulatory Visit (INDEPENDENT_AMBULATORY_CARE_PROVIDER_SITE_OTHER): Payer: Medicare Other

## 2017-12-21 ENCOUNTER — Encounter: Payer: Self-pay | Admitting: Internal Medicine

## 2017-12-21 VITALS — BP 120/78 | HR 84 | Temp 98.2°F | Ht 63.5 in | Wt 174.0 lb

## 2017-12-21 DIAGNOSIS — R202 Paresthesia of skin: Secondary | ICD-10-CM

## 2017-12-21 DIAGNOSIS — N183 Chronic kidney disease, stage 3 unspecified: Secondary | ICD-10-CM

## 2017-12-21 DIAGNOSIS — E559 Vitamin D deficiency, unspecified: Secondary | ICD-10-CM

## 2017-12-21 DIAGNOSIS — B079 Viral wart, unspecified: Secondary | ICD-10-CM | POA: Diagnosis not present

## 2017-12-21 DIAGNOSIS — E032 Hypothyroidism due to medicaments and other exogenous substances: Secondary | ICD-10-CM

## 2017-12-21 DIAGNOSIS — D7589 Other specified diseases of blood and blood-forming organs: Secondary | ICD-10-CM | POA: Diagnosis not present

## 2017-12-21 LAB — CBC WITH DIFFERENTIAL/PLATELET
BASOS PCT: 0 % (ref 0.0–3.0)
Eosinophils Relative: 2 % (ref 0.0–5.0)
HEMATOCRIT: 38.8 % (ref 36.0–46.0)
Hemoglobin: 12.8 g/dL (ref 12.0–15.0)
LYMPHS PCT: 22 % (ref 12.0–46.0)
MCHC: 32.9 g/dL (ref 30.0–36.0)
MCV: 103.1 fl — AB (ref 78.0–100.0)
Monocytes Relative: 22 % — ABNORMAL HIGH (ref 3.0–12.0)
NEUTROS PCT: 54 % (ref 43.0–77.0)
PLATELETS: 188 10*3/uL (ref 150.0–400.0)
RBC: 3.76 Mil/uL — ABNORMAL LOW (ref 3.87–5.11)
RDW: 14 % (ref 11.5–15.5)
WBC: 3.5 10*3/uL — AB (ref 4.0–10.5)

## 2017-12-21 LAB — VITAMIN B12: VITAMIN B 12: 422 pg/mL (ref 211–911)

## 2017-12-21 LAB — SEDIMENTATION RATE: SED RATE: 29 mm/h (ref 0–30)

## 2017-12-21 NOTE — Progress Notes (Signed)
Subjective:  Patient ID: [name], female    DOB: 1929/06/22  Age: 84 y.o. MRN: [id number]  CC: No chief complaint on file.   HPI [name] presents for LBP x 6 mo off and on, R lat leg pain and swelling. Leg is ok now - sprain (?) F/u FMS, CFS, IBS  Outpatient Medications Prior to Visit  Medication Sig Dispense Refill  . allopurinol (ZYLOPRIM) 300 MG tablet Take 1 tablet (300 mg total) by mouth daily. 30 tablet 5  . cetirizine (ZYRTEC) 10 MG tablet Take 10 mg by mouth daily as needed for allergies.    [name] escitalopram (LEXAPRO) 10 MG tablet TAKE 1 TABLET (10 MG) BY MOUTH DAILY AT BEDTIME. 90 tablet 1  . feeding supplement, ENSURE ENLIVE, (ENSURE ENLIVE) LIQD Take 237 mLs by mouth 2 (two) times daily between meals. 237 mL 12  . levothyroxine (SYNTHROID, LEVOTHROID) 100 MCG tablet Take 1 tablet (100 mcg total) by mouth daily. 90 tablet 1  . LORazepam (ATIVAN) 0.5 MG tablet Take 1 tab daily as needed for anxiety 30 tablet 2  . Magnesium Oxide 200 MG TABS Take 1 tablet (200 mg total) by mouth daily. 90 tablet 3  . mirabegron ER (MYRBETRIQ) 50 MG TB24 tablet Take 50 mg by mouth daily.    . potassium chloride SA (KLOR-CON M20) 20 MEQ tablet Take 1 tablet (20 mEq total) by mouth daily. 30 tablet 2  . psyllium (METAMUCIL SMOOTH TEXTURE) 28 % packet Take 1 packet by mouth daily with breakfast.    . ranitidine (ZANTAC) 150 MG tablet Take 150 mg by mouth 3 (three) times daily.    [name] REFRESH TEARS 0.5 % SOLN 1-2 DROPS IN [facility] EYE TWICE DAILY  0  . spironolactone (ALDACTONE) 25 MG tablet TAKE 1 TABLET (25 MG TOTAL) BY MOUTH DAILY. 30 tablet 11  . Turmeric 500 MG TABS Take 1 tablet by mouth 2 (two) times daily. 100 tablet 3   No facility-administered medications prior to visit.     ROS: Review of Systems  Constitutional: Positive for fatigue. Negative for activity change, appetite change, chills and unexpected weight change.  HENT: Negative for congestion, mouth sores and sinus pressure.     Eyes: Negative for visual disturbance.  Respiratory: Negative for cough and chest tightness.   Gastrointestinal: Negative for abdominal pain and nausea.  Genitourinary: Negative for difficulty urinating, frequency and vaginal pain.  Musculoskeletal: Positive for arthralgias, back pain and gait problem.  Skin: Negative for pallor and rash.  Neurological: Negative for dizziness, tremors, weakness, numbness and headaches.  Psychiatric/Behavioral: Positive for sleep disturbance. Negative for confusion. The patient is nervous/anxious.     Objective:  BP 120/78 (BP Location: Right Arm, Patient Position: Sitting, Cuff Size: Normal)   Pulse 84   Temp 98.2 F (36.8 C) (Oral)   Ht 5' 3.5" (1.613 m)   Wt 174 lb (78.9 kg)   SpO2 98%   BMI 30.34 kg/m   BP Readings from Last 3 Encounters:  12/21/17 120/78  11/29/17 126/78  06/22/17 126/84    Wt Readings from Last 3 Encounters:  12/21/17 174 lb (78.9 kg)  11/29/17 174 lb (78.9 kg)  06/22/17 164 lb (74.4 kg)    Physical Exam  Constitutional: She appears well-developed. No distress.  HENT:  Head: Normocephalic.  Right Ear: External ear normal.  Left Ear: External ear normal.  Nose: Nose normal.  Mouth/Throat: Oropharynx is clear and moist.  Eyes: Pupils are equal, round, and reactive to light.  Conjunctivae are normal. Right eye exhibits no discharge. Left eye exhibits no discharge.  Neck: Normal range of motion. Neck supple. No JVD present. No tracheal deviation present. No thyromegaly present.  Cardiovascular: Normal rate, regular rhythm and normal heart sounds.  Pulmonary/Chest: No stridor. No respiratory distress. She has no wheezes.  Abdominal: Soft. Bowel sounds are normal. She exhibits no distension and no mass. There is no tenderness. There is no rebound and no guarding.  Musculoskeletal: She exhibits tenderness. She exhibits no edema.  Lymphadenopathy:    She has no cervical adenopathy.  Neurological: She displays normal  reflexes. No cranial nerve deficit. She exhibits normal muscle tone. Coordination normal.  Skin: No rash noted. No erythema.  Psychiatric: She has a normal mood and affect. Her behavior is normal. Judgment and thought content normal.  cane LS tender       Procedure Note :     Procedure : Cryosurgery   Indication:  Wart   Risks including unsuccessful procedure , bleeding, infection, bruising, scar, a need for a repeat  procedure and others were explained to the patient in detail as well as the benefits. Informed consent was obtained verbally.   1  lesion(s)  on  Lower back  was/were treated with liquid nitrogen on a Q-tip in a usual fasion . Band-Aid was applied and antibiotic ointment was given for a later use.   Tolerated well. Complications none.   Postprocedure instructions :     Keep the wounds clean. You can wash them with liquid soap and water. Pat dry with gauze or a Kleenex tissue  Before applying antibiotic ointment and a Band-Aid.   You need to report immediately  if  any signs of infection develop.     Lab Results  Component Value Date   WBC 3.7 (L) 11/29/2017   HGB 12.4 11/29/2017   HCT 37.1 11/29/2017   PLT 196.0 11/29/2017   GLUCOSE 85 11/29/2017   CHOL 174 05/11/2010   TRIG 78.0 05/11/2010   HDL 52.30 05/11/2010   LDLDIRECT 130.8 09/30/2006   LDLCALC 106 (H) 05/11/2010   ALT 9 11/29/2017   AST 15 11/29/2017   NA 137 11/29/2017   K 5.0 11/29/2017   CL 101 11/29/2017   CREATININE 1.06 11/29/2017   BUN 23 11/29/2017   CO2 30 11/29/2017   TSH 1.09 11/29/2017   INR 1.08 06/30/2016   HGBA1C 6.0 04/10/2012    No results found.  Assessment & Plan:   There are no diagnoses linked to this encounter.   No orders of the defined types were placed in this encounter.    Follow-up: No follow-ups on file.  Walker Kehr, MD

## 2017-12-21 NOTE — Assessment & Plan Note (Signed)
Labs

## 2017-12-21 NOTE — Assessment & Plan Note (Signed)
Sacral back See Cryo

## 2017-12-21 NOTE — Assessment & Plan Note (Signed)
Levothroid Latest labs ok

## 2017-12-21 NOTE — Assessment & Plan Note (Signed)
Vit D 

## 2017-12-21 NOTE — Addendum Note (Signed)
Addended by: Karren Cobble on: 12/21/2017 02:03 PM   Modules accepted: Orders

## 2017-12-21 NOTE — Assessment & Plan Note (Signed)
Monitoring labs 

## 2017-12-22 DIAGNOSIS — J301 Allergic rhinitis due to pollen: Secondary | ICD-10-CM | POA: Diagnosis not present

## 2017-12-22 DIAGNOSIS — J3089 Other allergic rhinitis: Secondary | ICD-10-CM | POA: Diagnosis not present

## 2017-12-27 ENCOUNTER — Other Ambulatory Visit: Payer: Medicare Other

## 2017-12-27 DIAGNOSIS — R202 Paresthesia of skin: Secondary | ICD-10-CM

## 2017-12-28 ENCOUNTER — Encounter (HOSPITAL_COMMUNITY): Payer: Self-pay | Admitting: Cardiology

## 2017-12-28 ENCOUNTER — Ambulatory Visit (HOSPITAL_COMMUNITY)
Admission: RE | Admit: 2017-12-28 | Discharge: 2017-12-28 | Disposition: A | Discharge status: Home / Self Care | Payer: Medicare Other | Source: Ambulatory Visit | Attending: Cardiology | Admitting: Cardiology

## 2017-12-28 VITALS — BP 116/64 | HR 68 | Wt 174.8 lb

## 2017-12-28 DIAGNOSIS — I10 Essential (primary) hypertension: Secondary | ICD-10-CM | POA: Insufficient documentation

## 2017-12-28 DIAGNOSIS — I495 Sick sinus syndrome: Secondary | ICD-10-CM | POA: Diagnosis not present

## 2017-12-28 DIAGNOSIS — Z79890 Hormone replacement therapy (postmenopausal): Secondary | ICD-10-CM | POA: Diagnosis not present

## 2017-12-28 DIAGNOSIS — R0600 Dyspnea, unspecified: Secondary | ICD-10-CM

## 2017-12-28 DIAGNOSIS — R0609 Other forms of dyspnea: Secondary | ICD-10-CM | POA: Diagnosis not present

## 2017-12-28 DIAGNOSIS — I272 Pulmonary hypertension, unspecified: Secondary | ICD-10-CM | POA: Diagnosis not present

## 2017-12-28 DIAGNOSIS — K219 Gastro-esophageal reflux disease without esophagitis: Secondary | ICD-10-CM | POA: Insufficient documentation

## 2017-12-28 DIAGNOSIS — I712 Thoracic aortic aneurysm, without rupture, unspecified: Secondary | ICD-10-CM

## 2017-12-28 DIAGNOSIS — G4733 Obstructive sleep apnea (adult) (pediatric): Secondary | ICD-10-CM | POA: Insufficient documentation

## 2017-12-28 DIAGNOSIS — E039 Hypothyroidism, unspecified: Secondary | ICD-10-CM | POA: Diagnosis not present

## 2017-12-28 DIAGNOSIS — F329 Major depressive disorder, single episode, unspecified: Secondary | ICD-10-CM | POA: Insufficient documentation

## 2017-12-28 DIAGNOSIS — I5032 Chronic diastolic (congestive) heart failure: Secondary | ICD-10-CM

## 2017-12-28 DIAGNOSIS — Z79899 Other long term (current) drug therapy: Secondary | ICD-10-CM | POA: Diagnosis not present

## 2017-12-28 NOTE — Patient Instructions (Addendum)
Stop Potassium  Your physician has requested that you have an echocardiogram. Echocardiography is a painless test that uses sound waves to create images of your heart. It provides your doctor with information about the size and shape of your heart and how well your heart's chambers and valves are working. This procedure takes approximately one hour. There are no restrictions for this procedure.  Non-Cardiac CT scanning, (CAT scanning), is a noninvasive, special x-ray that produces cross-sectional images of the body using x-rays and a computer. CT scans help physicians diagnose and treat medical conditions. For some CT exams, a contrast material is used to enhance visibility in the area of the body being studied. CT scans provide greater clarity and reveal more details than regular x-ray exams.  We will contact you in 1 year to schedule your next appointment.

## 2017-12-29 LAB — PROTEIN ELECTROPHORESIS, SERUM
ALPHA 1: 0.3 g/dL (ref 0.2–0.3)
ALPHA 2: 0.6 g/dL (ref 0.5–0.9)
Albumin ELP: 4 g/dL (ref 3.8–4.8)
Beta 2: 0.4 g/dL (ref 0.2–0.5)
Beta Globulin: 0.4 g/dL (ref 0.4–0.6)
Gamma Globulin: 1.1 g/dL (ref 0.8–1.7)
TOTAL PROTEIN: 6.8 g/dL (ref 6.1–8.1)

## 2017-12-29 NOTE — Progress Notes (Signed)
Patient ID: [name], female   DOB: Mar 03, 1930, 84 y.o.   MRN: [id number] PCP: [place] EP: Caryl Comes HF Cardiology: Aundra Dubin  84 y.o. with history of SA nodal dysfunction s/p PPM, HTN, and chronic exertional dyspnea presents for followup of dyspnea.  She has been seeing Dr Chase Caller with pulmonary and sees Dr Caryl Comes for her pacemaker. Over the last few years, she has noted significant exertional dyspnea that has been progressive.  She was sent to see me for evaluation of dyspnea.  There was some concern for asthma, but she says that Breo did not help, and she is no longer using it.  She also has prominent fatigue.   Echo in 2/15 showed normal EF with mild pulmonary hypertension (PASP 39 mmHg).  Echo in 7/17, however, showed PA systolic pressure 30 mmHg with normal RV size and systolic function and LVEF 55-60%.  PFTs in 3/15 were normal except for a mildly decreased DLCO. She had a high resolution CT of the chest in 5/17 that showed no ILD but PA was dilated suggesting possible pulmonary hypertension.  She was referred to this office for evaluation of possible pulmonary hypertension based on these findings. I tried her on empiric Lasix 20 mg daily, but this did not help her dyspnea and she has now stopped it.   I had her do a Agricultural consultant in 9/17.  This was low risk with no ischemia or infarction. We finally ended up doing a right heart cath in 10/17 that showed normal filling pressures and no pulmonary hypertension.  She had CPX in 9/18 that was submaximal and showed no significant cardiac or pulmonary limitation.   She is stable symptomatically, still short of breath with stairs, vacuuming.  No problems on flat ground. She now has a small studio apartment which has led to less stress for her.  No chest pain. No lightheadedness. No orthopnea/PND.  She is getting some exercise. Weight is fairly stable.   Labs (3/17): TSH normal.  Labs (6/17): K 3.9, creatinine 1.01, RF negative, ANA negative Labs  (7/17): K 3.8, creatinine 0.91, BNP 224, HCT 41.1 Labs (9/17): K 4.2, creatinine 0.9, BNP 124 Labs (10/17): K 4.4, creatinine 0.98 Labs (11/17): TSH normal Labs (3/18): hgb 12.8 Labs (5/18): K 4.3, creatinine 0.98 Labs (9/18): K 4.2, creatinine 1.14 Labs (8/19): K 5, creatinine 1.06  ECG (personally reviewed): a-paced, LAFB, poor RWP  PMH: 1. Gout 2. Depression 3. Hypothyroidism 4. SA node dysfunction with Medtronic PPM. 5. Stress echo (2012) normal. 6. GERD 7. IBS 8. HTN 9. Exertional dyspnea:  - PFTs (3/15) normal except for slightly decreased DLCO - Echo (2/15) with EF 65-70%, mild MR, PASP 39 mmHg.  - High resolution CT chest (5/17) with no evidence for IDL, PA enlarged concerning for pulmonary hypertension. - Possible asthma - Echo (7/17): EF 55-60%, mild MR with mitral valve prolapse, moderate TR, normal RV size and systolic function, PASP 30 mmHg.  - Lexiscan Cardiolite (9/17) with EF 55%, no ischemia/infarction - RHC (10/17) with mean RA 6, PA 28/9, mean PCWP 7, CI 2.37 - CPX (9/18): Peak VO2 11.2, VE/VCO2 slope 0.93, submaximal study => no definite cardiac or pulmonary limitation.  10. Ascending aorta dilation: 4.0 cm on CT chest 5/17.  11. OSA: Using CPAP.  12. Diverticulitis 3/18  SH: Lives with son, nonsmoker, no ETOH.  FH: CVAs, father with MI, multiple siblings with coronary disease (has > 10 siblings).   ROS: All systems reviewed and negative except as per HPI.  Current Outpatient Medications  Medication Sig Dispense Refill  . allopurinol (ZYLOPRIM) 300 MG tablet Take 1 tablet (300 mg total) by mouth daily. 30 tablet 5  . cetirizine (ZYRTEC) 10 MG tablet Take 10 mg by mouth daily as needed for allergies.    [name] escitalopram (LEXAPRO) 10 MG tablet TAKE 1 TABLET (10 MG) BY MOUTH DAILY AT BEDTIME. 90 tablet 1  . feeding supplement, ENSURE ENLIVE, (ENSURE ENLIVE) LIQD Take 237 mLs by mouth 2 (two) times daily between meals. 237 mL 12  . levothyroxine (SYNTHROID,  LEVOTHROID) 100 MCG tablet Take 1 tablet (100 mcg total) by mouth daily. 90 tablet 1  . LORazepam (ATIVAN) 0.5 MG tablet Take 1 tab daily as needed for anxiety 30 tablet 2  . Magnesium Oxide 200 MG TABS Take 1 tablet (200 mg total) by mouth daily. 90 tablet 3  . mirabegron ER (MYRBETRIQ) 50 MG TB24 tablet Take 50 mg by mouth daily.    . psyllium (METAMUCIL SMOOTH TEXTURE) 28 % packet Take 1 packet by mouth daily with breakfast.    . ranitidine (ZANTAC) 150 MG tablet Take 150 mg by mouth 3 (three) times daily.    [name] REFRESH TEARS 0.5 % SOLN 1-2 DROPS IN [facility] EYE TWICE DAILY  0  . spironolactone (ALDACTONE) 25 MG tablet TAKE 1 TABLET (25 MG TOTAL) BY MOUTH DAILY. 30 tablet 11  . Turmeric 500 MG TABS Take 1 tablet by mouth 2 (two) times daily. 100 tablet 3   No current facility-administered medications for this encounter.    BP 116/64   Pulse 68   Wt 79.3 kg (174 lb 12.8 oz)   SpO2 100%   BMI 30.48 kg/m  General: NAD Neck: No JVD, no thyromegaly or thyroid nodule.  Lungs: Clear to auscultation bilaterally with normal respiratory effort. CV: Nondisplaced PMI.  Heart regular S1/S2, no S3/S4, no murmur.  Trace ankle edema.  No carotid bruit.  Normal pedal pulses.  Abdomen: Soft, nontender, no hepatosplenomegaly, no distention.  Skin: Intact without lesions or rashes.  Neurologic: Alert and oriented x 3.  Psych: Normal affect. Extremities: No clubbing or cyanosis.  HEENT: Normal.    Assessment/Plan: 1. Exertional dyspnea:  This was progressive over several years.  There was some concern for asthma in the past but Breo inhaler did not help.  Echo in 2/15 showed normal EF with mildly elevated PA systolic pressure. However, more recent echo in 7/17 showed normal EF and RV function with no pulmonary hypertension.  PFTs in 3/15 were normal except for low DLCO, which could be indicative of pulmonary vascular disease.  High resolution chest CT in 5/17 did not show interstitial lung disease but did  show an enlarged PA, concerning for possible pulmonary hypertension.  NYHA class II-III symptoms.  Lexiscan Cardiolite in 9/17 showed no ischemia or infarction.  A trial of Lasix 20 mg daily did not help her breathing despite lowering BNP and she is now off Lasix.  I did a right heart cath in 10/17.  This showed normal filling pressures and no pulmonary hypertension.  CPX in 9/18 was submaximal, but no definite cardiac or pulmonary limitation.  I suspect the dyspnea is due to deconditioning more than anything else with some involvement of diastolic CHF. - With ongoing symptoms, will repeat echo.  - Encouraged exercise.   - I do not think that she needs to restart Lasix.  - Recent BMET with K 5, will stop supplemental K. She will continue spironolactone.  2. Dilated  ascending aorta: 4.0 cm on 5/17 CT.  Will repeat CT chest this year to follow ascending aortic aneurysm.    3. SA nodal dysfunction: She has a Medtronic PPM.  Followed by Dr Caryl Comes. 4. OSA: Now on CPAP.   I will see her back in 1 year if echo and CT stable.   [name] 12/29/2017

## 2018-01-02 ENCOUNTER — Ambulatory Visit (INDEPENDENT_AMBULATORY_CARE_PROVIDER_SITE_OTHER): Payer: Medicare Other | Admitting: Psychology

## 2018-01-02 DIAGNOSIS — F332 Major depressive disorder, recurrent severe without psychotic features: Secondary | ICD-10-CM | POA: Diagnosis not present

## 2018-01-04 ENCOUNTER — Other Ambulatory Visit (HOSPITAL_COMMUNITY): Payer: Self-pay | Admitting: Cardiology

## 2018-01-04 ENCOUNTER — Ambulatory Visit (HOSPITAL_COMMUNITY)
Admission: RE | Admit: 2018-01-04 | Discharge: 2018-01-04 | Disposition: A | Discharge status: Home / Self Care | Payer: Medicare Other | Source: Ambulatory Visit | Attending: Cardiology | Admitting: Cardiology

## 2018-01-04 ENCOUNTER — Ambulatory Visit: Payer: Medicare Other | Admitting: Podiatry

## 2018-01-04 DIAGNOSIS — I77810 Thoracic aortic ectasia: Secondary | ICD-10-CM | POA: Diagnosis not present

## 2018-01-04 DIAGNOSIS — K449 Diaphragmatic hernia without obstruction or gangrene: Secondary | ICD-10-CM | POA: Diagnosis not present

## 2018-01-04 DIAGNOSIS — I700 Atherosclerosis of aorta: Secondary | ICD-10-CM | POA: Insufficient documentation

## 2018-01-04 DIAGNOSIS — I712 Thoracic aortic aneurysm, without rupture, unspecified: Secondary | ICD-10-CM

## 2018-01-04 DIAGNOSIS — I2510 Atherosclerotic heart disease of native coronary artery without angina pectoris: Secondary | ICD-10-CM | POA: Insufficient documentation

## 2018-01-04 DIAGNOSIS — K8020 Calculus of gallbladder without cholecystitis without obstruction: Secondary | ICD-10-CM | POA: Insufficient documentation

## 2018-01-04 DIAGNOSIS — I517 Cardiomegaly: Secondary | ICD-10-CM | POA: Insufficient documentation

## 2018-01-05 ENCOUNTER — Telehealth: Payer: Self-pay

## 2018-01-05 ENCOUNTER — Other Ambulatory Visit (HOSPITAL_COMMUNITY): Payer: Self-pay | Admitting: Psychiatry

## 2018-01-05 ENCOUNTER — Ambulatory Visit (INDEPENDENT_AMBULATORY_CARE_PROVIDER_SITE_OTHER): Payer: Medicare Other | Admitting: *Deleted

## 2018-01-05 DIAGNOSIS — I495 Sick sinus syndrome: Secondary | ICD-10-CM

## 2018-01-05 DIAGNOSIS — F330 Major depressive disorder, recurrent, mild: Secondary | ICD-10-CM

## 2018-01-05 NOTE — Telephone Encounter (Signed)
LMOVM reminding pt to send remote transmission.   

## 2018-01-05 NOTE — Progress Notes (Signed)
Remote pacemaker transmission.   

## 2018-01-06 DIAGNOSIS — J3089 Other allergic rhinitis: Secondary | ICD-10-CM | POA: Diagnosis not present

## 2018-01-06 DIAGNOSIS — J301 Allergic rhinitis due to pollen: Secondary | ICD-10-CM | POA: Diagnosis not present

## 2018-01-09 ENCOUNTER — Other Ambulatory Visit: Payer: Self-pay | Admitting: Internal Medicine

## 2018-01-09 ENCOUNTER — Ambulatory Visit: Payer: Self-pay | Admitting: Internal Medicine

## 2018-01-09 NOTE — Telephone Encounter (Signed)
Noted. Ranitidine chemical is OK. There were OTC Zantac batches that were contaminated. Her pharmacy should be able to tell her if the batch she is using is safe to use. Thx

## 2018-01-09 NOTE — Telephone Encounter (Signed)
Please advise 

## 2018-01-09 NOTE — Telephone Encounter (Signed)
  Pt called to notify her PCP that she read that ranitidine she is taking can cause liver cancer and is going to quit taking it. She called the pharmacist at [facility] and he stated that she was correct.  Pt stated that she is going to quit taking her Spironolactone because she feels she doesn not need it. She stated she will take garlic pills.   Answer Assessment - Initial Assessment Questions 1. SYMPTOMS: "Do you have any symptoms?"     no 2. SEVERITY: If symptoms are present, ask "Are they mild, moderate or severe?"     N/a  Protocols used: MEDICATION QUESTION CALL-A-AH

## 2018-01-09 NOTE — Telephone Encounter (Signed)
Pt.notified

## 2018-01-10 ENCOUNTER — Other Ambulatory Visit (HOSPITAL_COMMUNITY): Payer: Self-pay

## 2018-01-10 ENCOUNTER — Telehealth (HOSPITAL_COMMUNITY): Payer: Self-pay | Admitting: *Deleted

## 2018-01-10 DIAGNOSIS — F330 Major depressive disorder, recurrent, mild: Secondary | ICD-10-CM

## 2018-01-10 MED ORDER — LORAZEPAM 0.5 MG PO TABS: ORAL_TABLET | ORAL | 0 refills | Status: DC

## 2018-01-10 NOTE — Telephone Encounter (Signed)
Called to discuss results of Chest CT with patient and while we were on the phone she was still complaining about burning/tingling all through her leg.  She stated she had discussed this with Dr. Aundra Dubin at her last OV note.  She's asking if she could be referred to see someone regarding the pain because she feels that its getting worse.   Will forward to Dr. Aundra Dubin review.

## 2018-01-10 NOTE — Telephone Encounter (Signed)
That is most likely neuropathy. Probably needs to start with her PCP.

## 2018-01-13 ENCOUNTER — Ambulatory Visit (INDEPENDENT_AMBULATORY_CARE_PROVIDER_SITE_OTHER)
Admission: RE | Admit: 2018-01-13 | Discharge: 2018-01-13 | Disposition: A | Discharge status: Home / Self Care | Payer: Medicare Other | Source: Ambulatory Visit | Attending: Family | Admitting: Family

## 2018-01-13 ENCOUNTER — Encounter: Payer: Self-pay | Admitting: Family

## 2018-01-13 ENCOUNTER — Ambulatory Visit (INDEPENDENT_AMBULATORY_CARE_PROVIDER_SITE_OTHER): Payer: Medicare Other | Admitting: Family

## 2018-01-13 VITALS — BP 116/70 | HR 67 | Temp 98.0°F | Ht 63.5 in | Wt 174.0 lb

## 2018-01-13 DIAGNOSIS — M79604 Pain in right leg: Secondary | ICD-10-CM

## 2018-01-13 DIAGNOSIS — G629 Polyneuropathy, unspecified: Secondary | ICD-10-CM

## 2018-01-13 DIAGNOSIS — M545 Low back pain: Secondary | ICD-10-CM | POA: Diagnosis not present

## 2018-01-13 NOTE — Telephone Encounter (Signed)
Called patient back and she stated she would contact her PCP as she is very uncomfortable.  No further questions.

## 2018-01-13 NOTE — Progress Notes (Signed)
[name] is a 84 y.o. female with the following history as recorded in EpicCare:  Patient Active Problem List   Diagnosis Date Noted  . Macrocytosis 12/21/2017  . Wart 12/21/2017  . OSA (obstructive sleep apnea) 07/20/2016  . Sigmoid diverticulitis 07/01/2016  . Acute diverticulitis   . Rectal bleeding 06/30/2016  . Impacted cerumen of right ear 03/26/2016  . Syncope 03/22/2016  . Benign essential HTN 03/22/2016  . CAD (coronary artery disease) 03/22/2016  . CKD (chronic kidney disease) stage 3, GFR 30-59 ml/min (HCC) 03/22/2016  . Depression 03/22/2016  . Exertional dyspnea 11/04/2015  . Ascending aortic aneurysm ([place]) 11/04/2015  . 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 Arthralgia 10/10/2015  . Pulmonary hypertension ([place]) 09/30/2015  . Mild persistent asthma 09/30/2015  . Irritable larynx 09/04/2015  . Rotator cuff disorder 03/06/2015  . OCD (obsessive compulsive disorder) 04/03/2013  . IBS (irritable bowel syndrome) 07/03/2012  . Dysphagia 04/13/2012  . Denture irritation 12/20/2011  . Irritable bladder 12/20/2011  . Knee pain, bilateral 12/20/2011  . Hearing loss 10/11/2011  . Pacemaker-Medtronic 05/18/2011  . Gout, unspecified 04/30/2010  . Anal fissure 02/03/2010  . Atrial fibrillation ([place]) 08/26/2009  . Sinoatrial node dysfunction ([place]) 09/17/2008  . HYPOTENSION, ORTHOSTATIC 01/29/2008  . Microscopic hematuria 08/21/2007  . Pain in joint, pelvic region and thigh 08/01/2007  . Vitamin D deficiency 05/23/2007  . Anxiety state 05/23/2007  . Hypothyroidism 03/22/2007  . GERD 03/22/2007  . Osteoarthritis 03/22/2007  . CFS (chronic fatigue syndrome) 11/17/2006  . Hypertensive heart disease 11/17/2006  . ALLERGIC RHINITIS 11/17/2006  . OSTEOPOROSIS 11/17/2006  . BREAST CANCER, HX OF 11/17/2006    Current Outpatient Medications  Medication Sig Dispense Refill  . allopurinol (ZYLOPRIM) 300 MG tablet Take 1 tablet (300 mg total) by mouth daily. 30 tablet 5  . cetirizine  (ZYRTEC) 10 MG tablet Take 10 mg by mouth daily as needed for allergies.    [name] escitalopram (LEXAPRO) 10 MG tablet TAKE 1 TABLET (10 MG) BY MOUTH DAILY AT BEDTIME. 90 tablet 1  . feeding supplement, ENSURE ENLIVE, (ENSURE ENLIVE) LIQD Take 237 mLs by mouth 2 (two) times daily between meals. 237 mL 12  . levothyroxine (SYNTHROID, LEVOTHROID) 100 MCG tablet Take 1 tablet (100 mcg total) by mouth daily. 90 tablet 1  . LORazepam (ATIVAN) 0.5 MG tablet Take 1 tab daily as needed for anxiety 30 tablet 0  . Magnesium Oxide 200 MG TABS Take 1 tablet (200 mg total) by mouth daily. 90 tablet 3  . mirabegron ER (MYRBETRIQ) 50 MG TB24 tablet Take 50 mg by mouth daily.    . psyllium (METAMUCIL SMOOTH TEXTURE) 28 % packet Take 1 packet by mouth daily with breakfast.    . REFRESH TEARS 0.5 % SOLN 1-2 DROPS IN [facility] EYE TWICE DAILY  0  . Turmeric 500 MG TABS Take 1 tablet by mouth 2 (two) times daily. 100 tablet 3   No current facility-administered medications for this visit.     Allergies: Lactase; Amlodipine besy-benazepril hcl; Amlodipine besylate; Clonidine hydrochloride; Lactose intolerance (gi); Valsartan; and Verapamil  Past Medical History:  Diagnosis Date  . Anxiety   . Bradycardia   . Breast cancer, left breast ([place]) 1995  . Breast cancer, right breast ([place]) 2002  . CHF (congestive heart failure) ([place])   . Depression    Dr Cheryln Manly  . Discoid lupus    skin  . Full dentures   . GERD (gastroesophageal reflux disease)   . Glaucoma   .  Gout   . Hx of echocardiogram    a.  Echocardiogram (03/2011): Mild LVH, EF 44-01%, grade 1 diastolic dysfunction, mild MR, mild to moderate TR, PASP 42;  b. Echo (05/2013):  Mod LVH, EF 65-70%, no WMA, Gr 1 DD, mild MR, mod TR, PASP 39  . Hx of exercise stress test    a. ETT-echocardiogram (06/08/11): Sub-optimal exercise. Test stopped early due to dizziness and hypotension. EF 60%.  Non-diagnostic.;   b. Lexiscan Myoview (06/2013):  Diaphragmatic attenuation, no  ischemia, EF 61%.  Low Risk  . Hypertension   . Hypothyroidism   . IBS (irritable bowel syndrome)   . IBS (irritable bowel syndrome)   . Incontinence   . OSA (obstructive sleep apnea) 07/20/2016   Mild with AHI 12/hr now on CPAP  . OSA on CPAP    "quit wearing mask; retested; not enough information; to be tested again 04/08/2016"  . Osteoarthritis   . Osteopenia   . Pacemaker 2009   Brady//Chronotropic incompetence with normal pacemaker function  . Thyroid disease   . Wears hearing aid    both ears    Past Surgical History:  Procedure Laterality Date  . BREAST BIOPSY Left 1995  . BREAST LUMPECTOMY Left 1995   axillary node dissectoin  . CARDIAC CATHETERIZATION N/A 02/13/2016   Procedure: Right Heart Cath;  Surgeon: Larey Dresser, MD;  Location: [place];  Service: Cardiovascular;  Laterality: N/A;  . CATARACT EXTRACTION W/ INTRAOCULAR LENS  IMPLANT, BILATERAL Bilateral 06/2009 - 12.2915   left - right  . DORSAL COMPARTMENT RELEASE  2000   left  . HAMMER TOE SURGERY Left 2004  . HEMORRHOID BANDING    . INSERT / REPLACE / REMOVE PACEMAKER  2009  . KNEE ARTHROSCOPY Left 08/29/2012   Procedure: LEFT KNEE ARTHROSCOPY ;  Surgeon: Hessie Dibble, MD;  Location: [facility];  Service: Orthopedics;  Laterality: Left;  [name] MASTECTOMY Bilateral 09/2000   nbx  . TRIGGER FINGER RELEASE Right 07/31/2013   Procedure: EXCISE MASS RIGHT INDEX A-1 PLLLEY RELEASE A-1 RIGHT INDEX;  Surgeon: Cammie Sickle., MD;  Location: [facility];  Service: Orthopedics;  Laterality: Right;  [name] VAGINAL HYSTERECTOMY      Family History  Problem Relation Age of Onset  . Ovarian cancer Mother   . Heart disease Father   . Stroke Sister   . Stroke Brother   . Cancer Brother   . Heart disease Brother     Social History   Tobacco Use  . Smoking status: Never Smoker  . Smokeless tobacco: Never Used  Substance Use Topics  . Alcohol use: No    Alcohol/week: 0.0  standard drinks    Subjective:  Requesting referral to see specialist regarding suspected neuropathy; has been struggling with right sided leg pain for the past 2 months; lab work normal; cardiologist called her today and said no vascular source; was told she needed a referral for further testing; does not want to take any medication at this time.    Objective:  Vitals:   01/13/18 1412  BP: 116/70  Pulse: 67  Temp: 98 F (36.7 C)  TempSrc: Oral  SpO2: 99%  Weight: 174 lb (78.9 kg)  Height: 5' 3.5" (1.613 m)    General: Well developed, well nourished, in no acute distress  Skin : Warm and dry.  Head: Normocephalic and atraumatic  Lungs: Respirations unlabored; Musculoskeletal: No deformities; no active joint inflammation  Extremities: No  edema, cyanosis, clubbing  Vessels: Symmetric bilaterally  Neurologic: Alert and oriented; speech intact; face symmetrical; moves all extremities well; CNII-XII intact without focal deficit   Assessment:  1. Neuropathy   2. Right leg pain     Plan:  Update lumbar X-ray; discussed trial of medication like Neurontin- she defers at this time; referral to neurologist for further evaluation. She defers flu shot- notes she never takes; Follow-up with her PCP as scheduled.   No follow-ups on file.  Orders Placed This Encounter  Procedures  . DG Lumbar Spine 2-3 Views    Standing Status:   Future    Number of Occurrences:   1    Standing Expiration Date:   03/16/2019    Order Specific Question:   Reason for Exam (SYMPTOM  OR DIAGNOSIS REQUIRED)    Answer:   right leg pain    Order Specific Question:   Preferred imaging location?    Answer:   [facility]    Order Specific Question:   Radiology Contrast Protocol - do NOT remove file path    Answer:   [email or web address]  . Ambulatory referral to Neurology    Referral Priority:   Routine    Referral Type:   Consultation    Referral Reason:   Specialty  Services Required    Requested Specialty:   Neurology    Number of Visits Requested:   1    Requested Prescriptions    No prescriptions requested or ordered in this encounter

## 2018-01-13 NOTE — Patient Instructions (Signed)
Neuropathic Pain Neuropathic pain is pain caused by damage to the nerves that are responsible for certain sensations in your body (sensory nerves). The pain can be caused by damage to:  The sensory nerves that send signals to your spinal cord and brain (peripheral nervous system).  The sensory nerves in your brain or spinal cord (central nervous system).  Neuropathic pain can make you more sensitive to pain. What would be a minor sensation for most people may feel very painful if you have neuropathic pain. This is usually a long-term condition that can be difficult to treat. The type of pain can differ from person to person. It may start suddenly (acute), or it may develop slowly and last for a long time (chronic). Neuropathic pain may come and go as damaged nerves heal or may stay at the same level for years. It often causes emotional distress, loss of sleep, and a lower quality of life. What are the causes? The most common cause of damage to a sensory nerve is diabetes. Many other diseases and conditions can also cause neuropathic pain. Causes of neuropathic pain can be classified as:  Toxic. Many drugs and chemicals can cause toxic damage. The most common cause of toxic neuropathic pain is damage from drug treatment for cancer (chemotherapy).  Metabolic. This type of pain can happen when a disease causes imbalances that damage nerves. Diabetes is the most common of these diseases. Vitamin B deficiency caused by long-term alcohol abuse is another common cause.  Traumatic. Any injury that cuts, crushes, or stretches a nerve can cause damage and pain. A common example is feeling pain after losing an arm or leg (phantom limb pain).  Compression-related. If a sensory nerve gets trapped or compressed for a long period of time, the blood supply to the nerve can be cut off.  Vascular. Many blood vessel diseases can cause neuropathic pain by decreasing blood supply and oxygen to nerves.  Autoimmune.  This type of pain results from diseases in which the body's defense system mistakenly attacks sensory nerves. Examples of autoimmune diseases that can cause neuropathic pain include lupus and multiple sclerosis.  Infectious. Many types of viral infections can damage sensory nerves and cause pain. Shingles infection is a common cause of this type of pain.  Inherited. Neuropathic pain can be a symptom of many diseases that are passed down through families (genetic).  What are the signs or symptoms? The main symptom is pain. Neuropathic pain is often described as:  Burning.  Shock-like.  Stinging.  Hot or cold.  Itching.  How is this diagnosed? No single test can diagnose neuropathic pain. Your health care provider will do a physical exam and ask you about your pain. You may use a pain scale to describe how bad your pain is. You may also have tests to see if you have a high sensitivity to pain and to help find the cause and location of any sensory nerve damage. These tests may include:  Imaging studies, such as: ? X-rays. ? CT scan. ? MRI.  Nerve conduction studies to test how well nerve signals travel through your sensory nerves (electrodiagnostic testing).  Stimulating your sensory nerves through electrodes on your skin and measuring the response in your spinal cord and brain (somatosensory evoked potentials).  How is this treated? Treatment for neuropathic pain may change over time. You may need to try different treatment options or a combination of treatments. Some options include:  Over-the-counter pain relievers.  Prescription medicines. Some medicines   used to treat other conditions may also help neuropathic pain. These include medicines to: ? Control seizures (anticonvulsants). ? Relieve depression (antidepressants).  Prescription-strength pain relievers (narcotics). These are usually used when other pain relievers do not help.  Transcutaneous nerve stimulation (TENS).  This uses electrical currents to block painful nerve signals. The treatment is painless.  Topical and local anesthetics. These are medicines that numb the nerves. They can be injected as a nerve block or applied to the skin.  Alternative treatments, such as: ? Acupuncture. ? Meditation. ? Massage. ? Physical therapy. ? Pain management programs. ? Counseling.  Follow these instructions at home:  Learn as much as you can about your condition.  Take medicines only as directed by your health care provider.  Work closely with all your health care providers to find what works best for you.  Have a good support system at home.  Consider joining a chronic pain support group. Contact a health care provider if:  Your pain treatments are not helping.  You are having side effects from your medicines.  You are struggling with fatigue, mood changes, depression, or anxiety. This information is not intended to replace advice given to you by your health care provider. Make sure you discuss any questions you have with your health care provider. Document Released: 01/08/2004 Document Revised: 10/31/2015 Document Reviewed: 09/20/2013 Elsevier Interactive Patient Education  2018 Elsevier Inc.  

## 2018-01-17 ENCOUNTER — Ambulatory Visit (HOSPITAL_COMMUNITY)
Admission: RE | Admit: 2018-01-17 | Discharge: 2018-01-17 | Disposition: A | Discharge status: Home / Self Care | Payer: Medicare Other | Source: Ambulatory Visit | Attending: Cardiology | Admitting: Cardiology

## 2018-01-17 ENCOUNTER — Encounter: Payer: Self-pay | Admitting: Neurology

## 2018-01-17 DIAGNOSIS — I2510 Atherosclerotic heart disease of native coronary artery without angina pectoris: Secondary | ICD-10-CM | POA: Diagnosis not present

## 2018-01-17 DIAGNOSIS — I4891 Unspecified atrial fibrillation: Secondary | ICD-10-CM | POA: Insufficient documentation

## 2018-01-17 DIAGNOSIS — I272 Pulmonary hypertension, unspecified: Secondary | ICD-10-CM | POA: Diagnosis not present

## 2018-01-17 DIAGNOSIS — N189 Chronic kidney disease, unspecified: Secondary | ICD-10-CM | POA: Insufficient documentation

## 2018-01-17 DIAGNOSIS — I129 Hypertensive chronic kidney disease with stage 1 through stage 4 chronic kidney disease, or unspecified chronic kidney disease: Secondary | ICD-10-CM | POA: Diagnosis not present

## 2018-01-17 DIAGNOSIS — I361 Nonrheumatic tricuspid (valve) insufficiency: Secondary | ICD-10-CM | POA: Diagnosis not present

## 2018-01-17 DIAGNOSIS — R0600 Dyspnea, unspecified: Secondary | ICD-10-CM | POA: Diagnosis not present

## 2018-01-17 NOTE — Progress Notes (Signed)
  Echocardiogram 2D Echocardiogram has been performed.  Treysean Petruzzi L Androw 01/17/2018, 1:46 PM

## 2018-01-18 ENCOUNTER — Telehealth: Payer: Self-pay | Admitting: Internal Medicine

## 2018-01-18 DIAGNOSIS — H401231 Low-tension glaucoma, bilateral, mild stage: Secondary | ICD-10-CM | POA: Diagnosis not present

## 2018-01-18 NOTE — Telephone Encounter (Signed)
Attempted to contact pt. I did not receive an answer. There was no option for me to leave a message. Will try back.  

## 2018-01-19 ENCOUNTER — Telehealth (HOSPITAL_COMMUNITY): Payer: Self-pay | Admitting: *Deleted

## 2018-01-19 NOTE — Telephone Encounter (Signed)
Patient states that her Cardiologist and she has done a lot of test with them and she is unsure if she should still come in. She advised me she is still having SOB. I advised that she still come in for her appointment.Nothing further at this time.

## 2018-01-19 NOTE — Telephone Encounter (Signed)
Result Notes for ECHOCARDIOGRAM COMPLETE   Notes recorded by Darron Doom, RN on 01/19/2018 at 2:17 PM EDT Called and left detailed message, left call back number incase she had further questions. ------  Notes recorded by Larey Dresser, MD on 01/17/2018 at 10:21 PM EDT EF 60-65%, no evidence for pulmonary hypertension

## 2018-01-19 NOTE — Telephone Encounter (Signed)
Pt is returning call. Cb is [phone]

## 2018-01-19 NOTE — Telephone Encounter (Signed)
lmtcb for pt.  

## 2018-01-25 ENCOUNTER — Encounter: Payer: Self-pay | Admitting: Internal Medicine

## 2018-01-25 ENCOUNTER — Ambulatory Visit (INDEPENDENT_AMBULATORY_CARE_PROVIDER_SITE_OTHER): Payer: Medicare Other | Admitting: Internal Medicine

## 2018-01-25 VITALS — BP 112/70 | HR 69 | Ht 62.0 in | Wt 173.4 lb

## 2018-01-25 DIAGNOSIS — R0602 Shortness of breath: Secondary | ICD-10-CM

## 2018-01-25 LAB — CUP PACEART REMOTE DEVICE CHECK
Battery Remaining Longevity: 16 mo
Battery Voltage: 2.69 V
Brady Statistic AP VS Percent: 88 %
Date Time Interrogation Session: 20190912150443
Implantable Lead Implant Date: 20090527
Implantable Lead Location: 753859
Implantable Lead Model: 5076
Lead Channel Pacing Threshold Pulse Width: 0.4 ms
Lead Channel Pacing Threshold Pulse Width: 0.4 ms
Lead Channel Setting Pacing Amplitude: 2 V
Lead Channel Setting Pacing Amplitude: 2.5 V
Lead Channel Setting Pacing Pulse Width: 0.4 ms
MDC IDC LEAD IMPLANT DT: 20090527
MDC IDC LEAD LOCATION: 753860
MDC IDC MSMT BATTERY IMPEDANCE: 3525 Ohm
MDC IDC MSMT LEADCHNL RA IMPEDANCE VALUE: 547 Ohm
MDC IDC MSMT LEADCHNL RA PACING THRESHOLD AMPLITUDE: 0.625 V
MDC IDC MSMT LEADCHNL RV IMPEDANCE VALUE: 520 Ohm
MDC IDC MSMT LEADCHNL RV PACING THRESHOLD AMPLITUDE: 0.75 V
MDC IDC PG IMPLANT DT: 20090527
MDC IDC SET LEADCHNL RV SENSING SENSITIVITY: 2 mV
MDC IDC STAT BRADY AP VP PERCENT: 4 %
MDC IDC STAT BRADY AS VP PERCENT: 1 %
MDC IDC STAT BRADY AS VS PERCENT: 7 %

## 2018-01-25 MED ORDER — FLUTICASONE FUROATE-VILANTEROL 100-25 MCG/INH IN AEPB: 1.0000 | INHALATION_SPRAY | Freq: Every day | RESPIRATORY_TRACT | 0 refills | Status: DC

## 2018-01-25 NOTE — Progress Notes (Signed)
PCP Walker Kehr, MD   HPI  Brief patient profile:  46 yobf never smoker with h/o sneezing/itching/coughing  eval 1979 and placed on allergy shots ever since but nose runs year round  if doesn't  take zyrtec developed new unexplained doe 2014 self referred to pulmonary clinic 06/20/2013 with nl  pfts 07/02/13     History of Present Illness  06/20/2013 1st  Pulmonary office visit/ Wert cc progressive doe x one year (chart suggests dates back to 2012 however) indolent onset to point where can't do steps at [place], can't finish silver sneakers, can't walk a mile, can do grocery shopping but has to rest before going across the parking lot.  Hb during this time worse than usual on aciphex ac and pc rec Stop visken and start bystolic 5 mg one twice daily  Continue aciphex 20 mg Take 30- 60 min before your first and last meals of the day    07/02/2013 f/u ov/Wert re: unexplained sob Chief Complaint  Patient presents with  . Follow-up    Pt reports her breathing is unchanged since her last visit. She c/o HA since she started taking bystolic.   ha is random, transient, not related to time of day or activity, generalized, s nausea of neuro/viz cc's rec Finish your cardiac evaluation first, then if not doing better call [phone] for CPST with spirometry before and after > neg myoview, was not physically stressed at any point Zebeta 5 mg reduce to  one half tablet daily  Take only the medications on your list as they are on your after visit summary  If heart burn acts up, ok to resume aciphex 20 mg  > it did flare so she continue aciphe bid    08/07/2013 f/u ov/Wert re: unexplained doe, takes various alternative rx/ "nothing worksAdvertising account planner Complaint  Patient presents with  . Follow-up    Breathing is unchanged since last visit. She states that Cardiology ruled out any heart problems and advised her to f/u here.    no better or worse doe since onset, never occurs at rest or  sleeping, still can't do a grocery store s stopping to rest  rec Continue on current regimen   Follow med calendar closely and bring to each visit.   08/23/2013 followup and medication review. Patient returns for a followup visit and medication review. Reviewed all her medications and organized them into a medication calendar. Patient education. Appears the patient is taking her medication currently. Says her dyspnea is unchanged since last ov; no new complaints. rec Continue on current regimen  Labs today.  Follow med calendar closely and bring to each visit   09/20/2013 f/u ov/Wert re: sob x much worse since  first of the year/ no med calendar Chief Complaint  Patient presents with  . Shortness of Breath    Breathing is unchanged. Still reports DOE. Denies chest tightness or coughing.  no resting or noct symptoms - doe not really worse since onset   No obvious day to day or daytime variabilty or assoc chronic cough or cp or chest tightness, subjective wheeze overt sinus or hb symptoms. No unusual exp hx or h/o childhood pna/ asthma or knowledge of premature birth.  Sleeping ok without nocturnal  or early am exacerbation  of respiratory  c/o's or need for noct saba. Also denies any obvious fluctuation of symptoms with weather or environmental changes or other aggravating or alleviating factors except as outlined above OV 09/04/2015  Chief Complaint  Patient presents with  . Pulmonary Consult    Pt referred by Dr. Harold Hedge. Pt c/o blood tinged sputum when waking in morning x 1 year. Pt c/o intermittent DOE. Pt denies CP/tightness.        Referred by Dr. Fredderick Phenix for hemoptysis. Patient herself is a poor historian. She was last seen by Dr. Melvyn Novas in our office in May 2015 and discharged from follow-up. As best as I can gather in reviewing outside chart, old chart and talking to the patient she seems to have a chronic cough for the last 1 year. She says it is mild but on RSI  cough score below it is obviously a lot more severe in symptoms with a score of 29. She says early in the morning when she gets up she notices a mild amount of hemoptysis. Although when I questioned her if she is coughing it up she says it comes from her throat. Tablet every other day. This been no weight loss since then. This no shortness of breath or wheezing. She follows at the [facility] for 1 or 2 decades. She's been on allergy shots to a few years ago. And then that was stopped. She is on follow-up with antihistamines but review of the records. She denies any shortness of breath. She is a nonsmoker. She does complain of scratchy throat and postnasal drip. She's never seen a ENT physician. She seems to think all her cough is due to acid reflux  no matter what I tried to explain   Exhaled nitric oxide in our office today 09/04/2015 - slightly elevated at 41 ppb; denies having been on any asthma inhalers for many years  She is unsure if she wants to do testing for hemoptysis ".doc do what you  need to do"  Prior imaging reviewed and visualized 04/16/2011 she had CT angiogram chest that ruled out pulmonary embolism. Pulmonary parenchyma was clear of infiltrates most recently she had chest x-ray 02/13/2015 that is clear of any infiltrates. She had full pulmonary function test March 2015 that I personally visualized and looks normal except for isolated low diffusion capacity of 61%. She had echocardiogram February 2015 that shows mild diastolic dysfunction she's not on any anticoagulants. According   OV 09/30/2015  Chief Complaint  Patient presents with  . Follow-up    pt states her SOB has improved, no more hemoptysis but does note a prod cough with yellow/white mucus.  Denies CP, fever, chest tightness.     Follow-up hemoptysis - is as never recurred after the first time. CT chest is clear CT sinuses clear. She prefers not to have bronchoscopy/  reports that she has never smoked. She has  never used smokeless tobacco.   Follow-up chronic cough presumably due to asthma based on exhaled nitric oxide testing last visit: Now on Brio and she is significantly better. Off and sputum production are all improved. She is only taking Brio 3 times a week because she forgets.  Other evaluation: She has dyspnea. CT chest showed pulmonary hypertension. Is also significant echocardiogram 2015. I made a referral to Dr. Aundra Dubin but she is able to see him I do not see any appointment made for him.   OV 04/02/2016  Chief Complaint  Patient presents with  . Follow-up    Pt c/o prod cough with yellow mucus - pt was given abx by allergist and pt states the abx are helping. Pt deneis hemoptysis and CP/tightness.    Patient is  a very poor historian. She confuses history. Last seen June 2017  - Hemoptysis: At the time of my visit in June 2017 she said she has not had any hemoptysis. Today she tells me that she has not had hemoptysis in the last few months. However she is unable to ascertain if she has been hemoptysis free since June 2017.    Evaluation for pulmonary arterial hypertension based on CT chest: Right heart pressures aon cardiac catheterization 02/13/2016 by Dr. [name] a normal period this was reviewed by myself  Chronic cough labeled as mild persistent asthma due to borderline high nitric oxide and responds to [place] in the summer 2017: She tells me that she is not taking the Brio anymore because someone told her she does not have asthma. She also tells me that the cough is present now and is mild to moderate in intensity with some element mucus she is unable to tell if it is worse than baseline or not. My sense is that is only mild-moderate in intensity.   OV 02/16/2017  Chief Complaint  Patient presents with  . Follow-up    Pt c/o being out of breath all the time and occ. cough. Pt states that within the past month, pt coughed up a small amount of blood but none since then.  Denies any CP..   84 year old female . He is an extremely poor historian. In the past she had hemoptysis. This time she see me after almost 9 months and she tells me in the last 1 month she's had2 very small episodes of hemoptysis very small flecks of blood during routine cough. But at the same time she tells me that she does not have chronic cough. She is not taking her Brio anymore but then she complains about the cough in the shortness of breath. She does not want to take any inhalers at all side effects. She did see the allergist yesterday. Overall she feels things are stable she declined a flu shot  OV 01/25/2018  Subjective:  Patient ID: [name], female , DOB: 1930-01-03 , age 61 y.o. , MRN: [id number] , ADDRESS: [id number] [place] [id number]   01/25/2018 -   Chief Complaint  Patient presents with  . Follow-up    Pt states SOB has become worse since last visit. Denies any complaints of cough or CP.     HPI [name] 84 y.o. -presents for follow-up after 1 year.  She has an extremely poor historian and continues to do so.  Last time I saw her for some cough with hemoptysis in the CT scan and chest x-ray were clear.  Today she came back saying that her recollection of me was that I was a good physician and it took good care of her.  She is having arthritis and back issues and she thought that I would be the best doctor to take care of her for this.  I reminded her that was a pulmonologist.  She did not realize that.  So then she switched to pulmonary complaint of shortness of breath with exertion relieved by rest.  It is class III.  It is ongoing for the last 1 year probably worse in the last few months.  She again denied hemoptysis, cough, wheezing, orthopnea.  She continues to get allergy shots at the allergy practice.  Last visit her exam nitric oxide was borderline elevated.  She is not currently on any inhalers.  She seemed frustrated that I could  not address her back  pain and knee pain.    Dr Lorenza Cambridge Reflux Symptom Index (> 13-15 suggestive of LPR cough)  09/04/2015   Hoarseness of problem with voice 1  Clearing  Of Throat 2  Excess throat mucus or feeling of post nasal drip 5  Difficulty swallowing food, liquid or tablets 0  Cough after eating or lying down 5  Breathing difficulties or choking episodes 4  Troublesome or annoying cough 3  Sensation of something sticking in throat or lump in throat 4  Heartburn, chest pain, indigestion, or stomach acid coming up 5  TOTAL 29   Results for DAFFNEY, GREENLY "DARIANN HUCKABA" (MRN [id number]) as of 01/25/2018 12:14  Ref. Range 07/02/2016 06:18 07/08/2016 17:03 08/06/2016 15:35 11/29/2017 14:35 12/21/2017 11:56  Eosinophils Absolute Latest Ref Range: 0.0 - 0.7 K/uL   0.2 0.1    Results for GISELE, PACK (MRN [id number]) as of 02/16/2017 12:16  Ref. Range 09/04/2015 11:00  Nitric Oxide Unknown 41     ROS - per HPI     has a past medical history of Anxiety, Bradycardia, Breast cancer, left breast ([facility]) (1995), Breast cancer, right breast ([facility]) (2002), CHF (congestive heart failure) ([facility]), Depression, Discoid lupus, Full dentures, GERD (gastroesophageal reflux disease), Glaucoma, Gout, echocardiogram, exercise stress test, Hypertension, Hypothyroidism, IBS (irritable bowel syndrome), IBS (irritable bowel syndrome), Incontinence, OSA (obstructive sleep apnea) (07/20/2016), OSA on CPAP, Osteoarthritis, Osteopenia, Pacemaker (2009), Thyroid disease, and Wears hearing aid.   reports that she has never smoked. She has never used smokeless tobacco.  Past Surgical History:  Procedure Laterality Date  . BREAST BIOPSY Left 1995  . BREAST LUMPECTOMY Left 1995   axillary node dissectoin  . CARDIAC CATHETERIZATION N/A 02/13/2016   Procedure: Right Heart Cath;  Surgeon: Larey Dresser, MD;  Location: [facility];  Service: Cardiovascular;  Laterality: N/A;  . CATARACT EXTRACTION W/ INTRAOCULAR LENS  IMPLANT,  BILATERAL Bilateral 06/2009 - 12.2915   left - right  . DORSAL COMPARTMENT RELEASE  2000   left  . HAMMER TOE SURGERY Left 2004  . HEMORRHOID BANDING    . INSERT / REPLACE / REMOVE PACEMAKER  2009  . KNEE ARTHROSCOPY Left 08/29/2012   Procedure: LEFT KNEE ARTHROSCOPY ;  Surgeon: Hessie Dibble, MD;  Location: [facility];  Service: Orthopedics;  Laterality: Left;  [name] MASTECTOMY Bilateral 09/2000   nbx  . TRIGGER FINGER RELEASE Right 07/31/2013   Procedure: EXCISE MASS RIGHT INDEX A-1 PLLLEY RELEASE A-1 RIGHT INDEX;  Surgeon: Cammie Sickle., MD;  Location: [facility];  Service: Orthopedics;  Laterality: Right;  [name] VAGINAL HYSTERECTOMY      Allergies  Allergen Reactions  . Lactase Diarrhea  . Amlodipine Besy-Benazepril Hcl Swelling  . Amlodipine Besylate Swelling  . Clonidine Hydrochloride     REACTION: tired  . Lactose Intolerance (Gi) Other (See Comments)  . Valsartan Other (See Comments)    Tongue swelling   . Verapamil     Immunization History  Administered Date(s) Administered  . PPD Test 03/21/2017    Family History  Problem Relation Age of Onset  . Ovarian cancer Mother   . Heart disease Father   . Stroke Sister   . Stroke Brother   . Cancer Brother   . Heart disease Brother      Current Outpatient Medications:  .  allopurinol (ZYLOPRIM) 300 MG tablet, Take 1 tablet (300 mg total) by mouth daily., Disp: 30 tablet, Rfl: 5 .  cetirizine (ZYRTEC) 10 MG tablet, Take 10 mg by mouth daily as needed for allergies., Disp: , Rfl:  .  escitalopram (LEXAPRO) 10 MG tablet, TAKE 1 TABLET (10 MG) BY MOUTH DAILY AT BEDTIME., Disp: 90 tablet, Rfl: 1 .  feeding supplement, ENSURE ENLIVE, (ENSURE ENLIVE) LIQD, Take 237 mLs by mouth 2 (two) times daily between meals., Disp: 237 mL, Rfl: 12 .  levothyroxine (SYNTHROID, LEVOTHROID) 100 MCG tablet, Take 1 tablet (100 mcg total) by mouth daily., Disp: 90 tablet, Rfl: 1 .  LORazepam (ATIVAN) 0.5 MG tablet,  Take 1 tab daily as needed for anxiety, Disp: 30 tablet, Rfl: 0 .  Magnesium Oxide 200 MG TABS, Take 1 tablet (200 mg total) by mouth daily., Disp: 90 tablet, Rfl: 3 .  mirabegron ER (MYRBETRIQ) 50 MG TB24 tablet, Take 50 mg by mouth daily., Disp: , Rfl:  .  psyllium (METAMUCIL SMOOTH TEXTURE) 28 % packet, Take 1 packet by mouth daily with breakfast., Disp: , Rfl:  .  REFRESH TEARS 0.5 % SOLN, 1-2 DROPS IN [facility] EYE TWICE DAILY, Disp: , Rfl: 0 .  spironolactone (ALDACTONE) 25 MG tablet, Take 25 mg by mouth daily., Disp: , Rfl:  .  Turmeric 500 MG TABS, Take 1 tablet by mouth 2 (two) times daily., Disp: 100 tablet, Rfl: 3 .  fluticasone furoate-vilanterol (BREO ELLIPTA) 100-25 MCG/INH AEPB, Inhale 1 puff into the lungs daily., Disp: 1 each, Rfl: 0      Objective:   Vitals:   01/25/18 1150  BP: 112/70  Pulse: 69  SpO2: 100%  Weight: 173 lb 6.4 oz (78.7 kg)  Height: 5\' 2"  (1.575 m)    Estimated body mass index is 31.72 kg/m as calculated from the following:   Height as of this encounter: 5\' 2"  (1.575 m).   Weight as of this encounter: 173 lb 6.4 oz (78.7 kg).  @WEIGHTCHANGE @  [employer]   01/25/18 1150  Weight: 173 lb 6.4 oz (78.7 kg)     Physical Exam  General Appearance:    Alert, cooperative, no distress, appears stated age - yes , Deconditioned looking - no , OBESE  - yes, Sitting on Wheelchair -  no  Head:    Normocephalic, without obvious abnormality, atraumatic  Eyes:    PERRL, conjunctiva/corneas clear,  Ears:    Normal TM's and external ear canals, both ears  Nose:   Nares normal, septum midline, mucosa normal, no drainage    or sinus tenderness. OXYGEN ON  - no . Patient is @ ra   Throat:   Lips, mucosa, and tongue normal; teeth and gums normal. Cyanosis on lips - no  Neck:   Supple, symmetrical, trachea midline, no adenopathy;    thyroid:  no enlargement/tenderness/nodules; no carotid   bruit or JVD  Back:     Symmetric, no curvature, ROM normal, no CVA  tenderness  Lungs:     Distress - no , Wheeze non, Barrell Chest - no, Purse lip breathing - no, Crackles - no   Chest Wall:    No tenderness or deformity.    Heart:    Regular rate and rhythm, S1 and S2 normal, no rub   or gallop, Murmur - no  Breast Exam:    NOT DONE  Abdomen:     Soft, non-tender, bowel sounds active all four quadrants,    no masses, no organomegaly. Visceral obesity - yes  Genitalia:   NOT DONE  Rectal:   NOT DONE  Extremities:  Extremities - normal, Has Cane - yes, Clubbing - no, Edema - no  Pulses:   2+ and symmetric all extremities  Skin:   Stigmata of Connective Tissue Disease - no  Lymph nodes:   Cervical, supraclavicular, and axillary nodes normal  Psychiatric:  Neurologic:   Pleasant - yes, Anxious - n, Flat affect - no , Poor historian  CAm-ICU - neg, Alert and Oriented x 3 - yes, Moves all 4s - yes, Speech - normal, Cognition - intact           Assessment:       ICD-10-CM   1. Shortness of breath R06.02        Plan:     Patient Instructions     ICD-10-CM   1. Shortness of breath R06.02     Glad you do not have cough, wheezing or coughing up blood Shortness of breath likely due to weight, stiff heart muscle and lack of physical fitness But given allergy history and prior slight elevation in nitric oxide test, worth giving asthma treatment a try  Plan - take empiric breo samples of 1 month; use this inhaler daily  Followup Return in 1 month to see progress     SIGNATURE    Dr. Brand Males, M.D., F.C.C.P,  Pulmonary and Critical Care Medicine Staff Physician, [place] Director - Interstitial Lung Disease  Program  Pulmonary [facility] at [place], [place], [address]  Pager: [phone], If no answer or between  15:00h - 7:00h: call [phone] Telephone: [phone]  2:31 PM 01/25/2018

## 2018-01-25 NOTE — Patient Instructions (Signed)
ICD-10-CM   1. Shortness of breath R06.02     Glad you do not have cough, wheezing or coughing up blood Shortness of breath likely due to weight, stiff heart muscle and lack of physical fitness But given allergy history and prior slight elevation in nitric oxide test, worth giving asthma treatment a try  Plan - take empiric breo samples of 1 month; use this inhaler daily  Followup Return in 1 month to see progress

## 2018-01-26 ENCOUNTER — Telehealth: Payer: Self-pay | Admitting: Internal Medicine

## 2018-01-26 DIAGNOSIS — J301 Allergic rhinitis due to pollen: Secondary | ICD-10-CM | POA: Diagnosis not present

## 2018-01-26 DIAGNOSIS — J3089 Other allergic rhinitis: Secondary | ICD-10-CM | POA: Diagnosis not present

## 2018-01-26 NOTE — Telephone Encounter (Signed)
Patient called and stated she is not upset about the weight instructions per her AVS. She feels she does not have a lung problem. Therefore she will only call us if she needs [place].   I instructed patient if she has SOB, wheezing, uncontrolled cough, to call us or the ER.  Nothing further needed, will route to MR as [facility]

## 2018-01-27 ENCOUNTER — Ambulatory Visit: Payer: Self-pay | Admitting: Cardiology

## 2018-01-27 NOTE — Telephone Encounter (Signed)
That is fine.  Her BMi was 31 and obesity is a medical term. I did not call her that. I only indicated that on my exam. Sorry she feels that way. Anyways she came herer for back pain and knee pain. Anwyays, respect how she feels.

## 2018-01-30 ENCOUNTER — Ambulatory Visit: Payer: Medicare Other | Admitting: Psychology

## 2018-01-31 ENCOUNTER — Telehealth: Payer: Self-pay | Admitting: Internal Medicine

## 2018-01-31 NOTE — Telephone Encounter (Signed)
Appt made to discuss 

## 2018-01-31 NOTE — Telephone Encounter (Signed)
Copied from [place] [id number]. Topic: General - Other >> Jan 31, 2018 12:34 PM Keene Breath wrote: Reason for CRM: Patient called to request an order for lab work be sent in per her Cardiologist.  Patient stated that the heart doctor would like for these labs to be done.   CB# [phone]

## 2018-02-01 ENCOUNTER — Other Ambulatory Visit (INDEPENDENT_AMBULATORY_CARE_PROVIDER_SITE_OTHER): Payer: Medicare Other

## 2018-02-01 ENCOUNTER — Encounter: Payer: Self-pay | Admitting: Family

## 2018-02-01 ENCOUNTER — Ambulatory Visit (INDEPENDENT_AMBULATORY_CARE_PROVIDER_SITE_OTHER): Payer: Medicare Other | Admitting: Family

## 2018-02-01 ENCOUNTER — Ambulatory Visit (HOSPITAL_COMMUNITY): Payer: Self-pay | Admitting: Psychiatry

## 2018-02-01 VITALS — BP 116/68 | HR 66 | Temp 98.0°F | Ht 62.0 in | Wt 176.0 lb

## 2018-02-01 DIAGNOSIS — E032 Hypothyroidism due to medicaments and other exogenous substances: Secondary | ICD-10-CM | POA: Diagnosis not present

## 2018-02-01 DIAGNOSIS — R0602 Shortness of breath: Secondary | ICD-10-CM | POA: Diagnosis not present

## 2018-02-01 LAB — BRAIN NATRIURETIC PEPTIDE: Pro B Natriuretic peptide (BNP): 152 pg/mL — ABNORMAL HIGH (ref 0.0–100.0)

## 2018-02-01 LAB — TSH: TSH: 0.74 u[IU]/mL (ref 0.35–4.50)

## 2018-02-01 NOTE — Progress Notes (Signed)
[name] is a 84 y.o. female with the following history as recorded in EpicCare:  Patient Active Problem List   Diagnosis Date Noted  . Macrocytosis 12/21/2017  . Wart 12/21/2017  . OSA (obstructive sleep apnea) 07/20/2016  . Sigmoid diverticulitis 07/01/2016  . Acute diverticulitis   . Rectal bleeding 06/30/2016  . Impacted cerumen of right ear 03/26/2016  . Syncope 03/22/2016  . Benign essential HTN 03/22/2016  . CAD (coronary artery disease) 03/22/2016  . CKD (chronic kidney disease) stage 3, GFR 30-59 ml/min (HCC) 03/22/2016  . Depression 03/22/2016  . Exertional dyspnea 11/04/2015  . Ascending aortic aneurysm ([place]) 11/04/2015  . 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 Arthralgia 10/10/2015  . Pulmonary hypertension ([place]) 09/30/2015  . Mild persistent asthma 09/30/2015  . Irritable larynx 09/04/2015  . Rotator cuff disorder 03/06/2015  . OCD (obsessive compulsive disorder) 04/03/2013  . IBS (irritable bowel syndrome) 07/03/2012  . Dysphagia 04/13/2012  . Denture irritation 12/20/2011  . Irritable bladder 12/20/2011  . Knee pain, bilateral 12/20/2011  . Hearing loss 10/11/2011  . Pacemaker-Medtronic 05/18/2011  . Gout, unspecified 04/30/2010  . Anal fissure 02/03/2010  . Atrial fibrillation ([place]) 08/26/2009  . Sinoatrial node dysfunction ([place]) 09/17/2008  . HYPOTENSION, ORTHOSTATIC 01/29/2008  . Microscopic hematuria 08/21/2007  . Pain in joint, pelvic region and thigh 08/01/2007  . Vitamin D deficiency 05/23/2007  . Anxiety state 05/23/2007  . Hypothyroidism 03/22/2007  . GERD 03/22/2007  . Osteoarthritis 03/22/2007  . CFS (chronic fatigue syndrome) 11/17/2006  . Hypertensive heart disease 11/17/2006  . ALLERGIC RHINITIS 11/17/2006  . OSTEOPOROSIS 11/17/2006  . BREAST CANCER, HX OF 11/17/2006    Current Outpatient Medications  Medication Sig Dispense Refill  . allopurinol (ZYLOPRIM) 300 MG tablet Take 1 tablet (300 mg total) by mouth daily. 30 tablet 5  . cetirizine  (ZYRTEC) 10 MG tablet Take 10 mg by mouth daily as needed for allergies.    [name] escitalopram (LEXAPRO) 10 MG tablet TAKE 1 TABLET (10 MG) BY MOUTH DAILY AT BEDTIME. 90 tablet 1  . feeding supplement, ENSURE ENLIVE, (ENSURE ENLIVE) LIQD Take 237 mLs by mouth 2 (two) times daily between meals. 237 mL 12  . fluticasone furoate-vilanterol (BREO ELLIPTA) 100-25 MCG/INH AEPB Inhale 1 puff into the lungs daily. 1 each 0  . LORazepam (ATIVAN) 0.5 MG tablet Take 1 tab daily as needed for anxiety 30 tablet 0  . Magnesium Oxide 200 MG TABS Take 1 tablet (200 mg total) by mouth daily. 90 tablet 3  . mirabegron ER (MYRBETRIQ) 50 MG TB24 tablet Take 50 mg by mouth daily.    . psyllium (METAMUCIL SMOOTH TEXTURE) 28 % packet Take 1 packet by mouth daily with breakfast.    . REFRESH TEARS 0.5 % SOLN 1-2 DROPS IN [facility] EYE TWICE DAILY  0  . spironolactone (ALDACTONE) 25 MG tablet Take 25 mg by mouth daily.    . Turmeric 500 MG TABS Take 1 tablet by mouth 2 (two) times daily. 100 tablet 3  . levothyroxine (SYNTHROID, LEVOTHROID) 100 MCG tablet Take 1 tablet (100 mcg total) by mouth daily. (Patient not taking: Reported on 02/01/2018) 90 tablet 1   No current facility-administered medications for this visit.     Allergies: Lactase; Amlodipine besy-benazepril hcl; Amlodipine besylate; Clonidine hydrochloride; Lactose intolerance (gi); Valsartan; and Verapamil  Past Medical History:  Diagnosis Date  . Anxiety   . Bradycardia   . Breast cancer, left breast ([place]) 1995  . Breast cancer, right breast ([place]) 2002  .  CHF (congestive heart failure) ([place])   . Depression    Dr Cheryln Manly  . Discoid lupus    skin  . Full dentures   . GERD (gastroesophageal reflux disease)   . Glaucoma   . Gout   . Hx of echocardiogram    a.  Echocardiogram (03/2011): Mild LVH, EF 09-32%, grade 1 diastolic dysfunction, mild MR, mild to moderate TR, PASP 42;  b. Echo (05/2013):  Mod LVH, EF 65-70%, no WMA, Gr 1 DD, mild MR, mod TR, PASP 39   . Hx of exercise stress test    a. ETT-echocardiogram (06/08/11): Sub-optimal exercise. Test stopped early due to dizziness and hypotension. EF 60%.  Non-diagnostic.;   b. Lexiscan Myoview (06/2013):  Diaphragmatic attenuation, no ischemia, EF 61%.  Low Risk  . Hypertension   . Hypothyroidism   . IBS (irritable bowel syndrome)   . IBS (irritable bowel syndrome)   . Incontinence   . OSA (obstructive sleep apnea) 07/20/2016   Mild with AHI 12/hr now on CPAP  . OSA on CPAP    "quit wearing mask; retested; not enough information; to be tested again 04/08/2016"  . Osteoarthritis   . Osteopenia   . Pacemaker 2009   Brady//Chronotropic incompetence with normal pacemaker function  . Thyroid disease   . Wears hearing aid    both ears    Past Surgical History:  Procedure Laterality Date  . BREAST BIOPSY Left 1995  . BREAST LUMPECTOMY Left 1995   axillary node dissectoin  . CARDIAC CATHETERIZATION N/A 02/13/2016   Procedure: Right Heart Cath;  Surgeon: Larey Dresser, MD;  [facility];  Service: Cardiovascular;  Laterality: N/A;  . CATARACT EXTRACTION W/ INTRAOCULAR LENS  IMPLANT, BILATERAL Bilateral 06/2009 - 12.2915   left - right  . DORSAL COMPARTMENT RELEASE  2000   left  . HAMMER TOE SURGERY Left 2004  . HEMORRHOID BANDING    . INSERT / REPLACE / REMOVE PACEMAKER  2009  . KNEE ARTHROSCOPY Left 08/29/2012   Procedure: LEFT KNEE ARTHROSCOPY ;  Surgeon: Hessie Dibble, MD;  Location: [facility];  Service: Orthopedics;  Laterality: Left;  [name] MASTECTOMY Bilateral 09/2000   nbx  . TRIGGER FINGER RELEASE Right 07/31/2013   Procedure: EXCISE MASS RIGHT INDEX A-1 PLLLEY RELEASE A-1 RIGHT INDEX;  Surgeon: Cammie Sickle., MD;  Location: [facility];  Service: Orthopedics;  Laterality: Right;  [name] VAGINAL HYSTERECTOMY      Family History  Problem Relation Age of Onset  . Ovarian cancer Mother   . Heart disease Father   . Stroke Sister   .  Stroke Brother   . Cancer Brother   . Heart disease Brother     Social History   Tobacco Use  . Smoking status: Never Smoker  . Smokeless tobacco: Never Used  Substance Use Topics  . Alcohol use: No    Alcohol/week: 0.0 standard drinks    Subjective:  Patient is a very difficult historian. She notes she just wanted to get some labs that her "cardiologist wanted ordered for her" due to continued problems with shortness of breath and weight gain. Patient continues to be obsessed that her thyroid level is not correct. It has been checked at least twice in the past 2 months and was normal. She has just seen pulmonology who told her the shortness of breath was possibly due to weight/ deconditioning/ recommend to try BREO daily for underlying asthma. I am somewhat  confused as to how she is here because her cardiologist wants labs/ she cannot explain to me what labs her cardiologist wants; last cardiology note was in early September for dyspnea but he felt dyspnea due to deconditioning.      Objective:  Vitals:   02/01/18 1126  BP: 116/68  Pulse: 66  Temp: 98 F (36.7 C)  TempSrc: Oral  SpO2: 99%  Weight: 176 lb (79.8 kg)  Height: 5\' 2"  (1.575 m)    General: Well developed, well nourished, in no acute distress  Skin : Warm and dry.  Head: Normocephalic and atraumatic  Eyes: Sclera and conjunctiva clear; pupils round and reactive to light; extraocular movements intact  Ears: External normal; canals clear; tympanic membranes normal  Oropharynx: Pink, supple. No suspicious lesions  Neck: Supple without thyromegaly, adenopathy  Lungs: Respirations unlabored; clear to auscultation bilaterally without wheeze, rales, rhonchi  CVS exam: normal rate and regular rhythm.  Neurologic: Alert and oriented; speech intact; face symmetrical; moves all extremities well; CNII-XII intact without focal deficit   Assessment:  1. Shortness of breath   2. Hypothyroidism due to medication     Plan:   Not sure what labs patient is wanting done at her cardiologist's request; I am going to update BNP, D-dimer and re-check her TSH; she is encouraged to take her asthma medication as given by pulmonlogy- see them as directed; will most likely refer her back to cardiology for heart failure re-check.  Spent 30 minutes with patient; greater than 50% spent in counseling;    No follow-ups on file.  Orders Placed This Encounter  Procedures  . D-Dimer, Quantitative    Standing Status:   Future    Number of Occurrences:   1    Standing Expiration Date:   02/01/2019  . TSH    Standing Status:   Future    Number of Occurrences:   1    Standing Expiration Date:   02/01/2019  . B Nat Peptide    Standing Status:   Future    Number of Occurrences:   1    Standing Expiration Date:   02/01/2019    Requested Prescriptions    No prescriptions requested or ordered in this encounter

## 2018-02-02 DIAGNOSIS — N3941 Urge incontinence: Secondary | ICD-10-CM | POA: Diagnosis not present

## 2018-02-02 DIAGNOSIS — R350 Frequency of micturition: Secondary | ICD-10-CM | POA: Diagnosis not present

## 2018-02-02 LAB — D-DIMER, QUANTITATIVE: D-Dimer, Quant: 2.82 mcg/mL FEU — ABNORMAL HIGH (ref <0.50)

## 2018-02-03 ENCOUNTER — Other Ambulatory Visit: Payer: Self-pay

## 2018-02-03 ENCOUNTER — Ambulatory Visit
Admission: RE | Admit: 2018-02-03 | Discharge: 2018-02-03 | Disposition: A | Discharge status: Home / Self Care | Payer: Medicare Other | Source: Ambulatory Visit | Attending: Family | Admitting: Family

## 2018-02-03 ENCOUNTER — Other Ambulatory Visit: Payer: Self-pay | Admitting: Family

## 2018-02-03 ENCOUNTER — Observation Stay (HOSPITAL_COMMUNITY)
Admission: EM | Admit: 2018-02-03 | Discharge: 2018-02-05 | Disposition: A | Discharge status: Home / Self Care | Payer: Medicare Other | Attending: Internal Medicine | Admitting: Internal Medicine

## 2018-02-03 ENCOUNTER — Encounter: Payer: Self-pay | Admitting: Radiology

## 2018-02-03 DIAGNOSIS — F329 Major depressive disorder, single episode, unspecified: Secondary | ICD-10-CM | POA: Insufficient documentation

## 2018-02-03 DIAGNOSIS — I509 Heart failure, unspecified: Secondary | ICD-10-CM | POA: Insufficient documentation

## 2018-02-03 DIAGNOSIS — Z853 Personal history of malignant neoplasm of breast: Secondary | ICD-10-CM | POA: Diagnosis not present

## 2018-02-03 DIAGNOSIS — J4530 Mild persistent asthma, uncomplicated: Secondary | ICD-10-CM | POA: Insufficient documentation

## 2018-02-03 DIAGNOSIS — M109 Gout, unspecified: Secondary | ICD-10-CM | POA: Diagnosis not present

## 2018-02-03 DIAGNOSIS — N183 Chronic kidney disease, stage 3 unspecified: Secondary | ICD-10-CM | POA: Diagnosis present

## 2018-02-03 DIAGNOSIS — F419 Anxiety disorder, unspecified: Secondary | ICD-10-CM | POA: Diagnosis not present

## 2018-02-03 DIAGNOSIS — G629 Polyneuropathy, unspecified: Secondary | ICD-10-CM | POA: Insufficient documentation

## 2018-02-03 DIAGNOSIS — L930 Discoid lupus erythematosus: Secondary | ICD-10-CM

## 2018-02-03 DIAGNOSIS — I130 Hypertensive heart and chronic kidney disease with heart failure and stage 1 through stage 4 chronic kidney disease, or unspecified chronic kidney disease: Secondary | ICD-10-CM | POA: Insufficient documentation

## 2018-02-03 DIAGNOSIS — K589 Irritable bowel syndrome without diarrhea: Secondary | ICD-10-CM | POA: Diagnosis not present

## 2018-02-03 DIAGNOSIS — I10 Essential (primary) hypertension: Secondary | ICD-10-CM | POA: Diagnosis not present

## 2018-02-03 DIAGNOSIS — I2699 Other pulmonary embolism without acute cor pulmonale: Secondary | ICD-10-CM | POA: Diagnosis not present

## 2018-02-03 DIAGNOSIS — E559 Vitamin D deficiency, unspecified: Secondary | ICD-10-CM | POA: Diagnosis not present

## 2018-02-03 DIAGNOSIS — Z66 Do not resuscitate: Secondary | ICD-10-CM | POA: Insufficient documentation

## 2018-02-03 DIAGNOSIS — I2510 Atherosclerotic heart disease of native coronary artery without angina pectoris: Secondary | ICD-10-CM | POA: Diagnosis not present

## 2018-02-03 DIAGNOSIS — Z79899 Other long term (current) drug therapy: Secondary | ICD-10-CM | POA: Insufficient documentation

## 2018-02-03 DIAGNOSIS — F411 Generalized anxiety disorder: Secondary | ICD-10-CM | POA: Diagnosis not present

## 2018-02-03 DIAGNOSIS — K219 Gastro-esophageal reflux disease without esophagitis: Secondary | ICD-10-CM | POA: Insufficient documentation

## 2018-02-03 DIAGNOSIS — F429 Obsessive-compulsive disorder, unspecified: Secondary | ICD-10-CM | POA: Diagnosis not present

## 2018-02-03 DIAGNOSIS — E039 Hypothyroidism, unspecified: Secondary | ICD-10-CM

## 2018-02-03 DIAGNOSIS — R0602 Shortness of breath: Secondary | ICD-10-CM

## 2018-02-03 DIAGNOSIS — I700 Atherosclerosis of aorta: Secondary | ICD-10-CM | POA: Insufficient documentation

## 2018-02-03 DIAGNOSIS — Z9013 Acquired absence of bilateral breasts and nipples: Secondary | ICD-10-CM | POA: Insufficient documentation

## 2018-02-03 DIAGNOSIS — G4733 Obstructive sleep apnea (adult) (pediatric): Secondary | ICD-10-CM | POA: Diagnosis not present

## 2018-02-03 DIAGNOSIS — Z950 Presence of cardiac pacemaker: Secondary | ICD-10-CM

## 2018-02-03 DIAGNOSIS — Z888 Allergy status to other drugs, medicaments and biological substances status: Secondary | ICD-10-CM | POA: Insufficient documentation

## 2018-02-03 LAB — CBC WITH DIFFERENTIAL/PLATELET
ABS IMMATURE GRANULOCYTES: 0.07 10*3/uL (ref 0.00–0.07)
BASOS ABS: 0 10*3/uL (ref 0.0–0.1)
Basophils Relative: 1 %
Eosinophils Absolute: 0.2 10*3/uL (ref 0.0–0.5)
Eosinophils Relative: 4 %
HEMATOCRIT: 41.1 % (ref 36.0–46.0)
HEMOGLOBIN: 13 g/dL (ref 12.0–15.0)
IMMATURE GRANULOCYTES: 2 %
LYMPHS ABS: 1.2 10*3/uL (ref 0.7–4.0)
LYMPHS PCT: 34 %
MCH: 33.2 pg (ref 26.0–34.0)
MCHC: 31.6 g/dL (ref 30.0–36.0)
MCV: 105.1 fL — ABNORMAL HIGH (ref 80.0–100.0)
MONOS PCT: 11 %
Monocytes Absolute: 0.4 10*3/uL (ref 0.1–1.0)
NEUTROS PCT: 48 %
NRBC: 0 % (ref 0.0–0.2)
Neutro Abs: 1.7 10*3/uL (ref 1.7–7.7)
Platelets: 178 10*3/uL (ref 150–400)
RBC: 3.91 MIL/uL (ref 3.87–5.11)
RDW: 13.8 % (ref 11.5–15.5)
WBC: 3.5 10*3/uL — ABNORMAL LOW (ref 4.0–10.5)

## 2018-02-03 LAB — BASIC METABOLIC PANEL
ANION GAP: 10 (ref 5–15)
BUN: 25 mg/dL — ABNORMAL HIGH (ref 8–23)
CHLORIDE: 103 mmol/L (ref 98–111)
CO2: 26 mmol/L (ref 22–32)
CREATININE: 1.01 mg/dL — AB (ref 0.44–1.00)
Calcium: 9.5 mg/dL (ref 8.9–10.3)
GFR calc non Af Amer: 49 mL/min — ABNORMAL LOW (ref >60)
GFR, EST AFRICAN AMERICAN: 57 mL/min — AB (ref >60)
Glucose, Bld: 82 mg/dL (ref 70–99)
POTASSIUM: 4.1 mmol/L (ref 3.5–5.1)
SODIUM: 139 mmol/L (ref 135–145)

## 2018-02-03 MED ORDER — HEPARIN (PORCINE) IN NACL 100-0.45 UNIT/ML-% IJ SOLN
1000.0000 [IU]/h | INTRAMUSCULAR | Status: DC
  Administered 2018-02-03: 1000 [IU]/h via INTRAVENOUS
  Filled 2018-02-03: qty 250

## 2018-02-03 MED ORDER — POLYETHYLENE GLYCOL 3350 17 G PO PACK
17.0000 g | PACK | Freq: Every day | ORAL | Status: DC | PRN
  Administered 2018-02-04: 17 g via ORAL
  Filled 2018-02-03: qty 1

## 2018-02-03 MED ORDER — PSYLLIUM 95 % PO PACK
1.0000 | PACK | Freq: Every day | ORAL | Status: DC
  Administered 2018-02-04 – 2018-02-05 (×2): 1 via ORAL
  Filled 2018-02-03 (×2): qty 1

## 2018-02-03 MED ORDER — SPIRONOLACTONE 25 MG PO TABS
25.0000 mg | ORAL_TABLET | Freq: Every day | ORAL | Status: DC
  Administered 2018-02-04 – 2018-02-05 (×2): 25 mg via ORAL
  Filled 2018-02-03 (×2): qty 1

## 2018-02-03 MED ORDER — LEVOTHYROXINE SODIUM 100 MCG PO TABS
100.0000 ug | ORAL_TABLET | Freq: Every day | ORAL | Status: DC
  Administered 2018-02-04 – 2018-02-05 (×2): 100 ug via ORAL
  Filled 2018-02-03 (×2): qty 1

## 2018-02-03 MED ORDER — MAGNESIUM OXIDE 400 (241.3 MG) MG PO TABS: 200.0000 mg | ORAL_TABLET | Freq: Every day | ORAL | Status: DC

## 2018-02-03 MED ORDER — MIRABEGRON ER 25 MG PO TB24
50.0000 mg | ORAL_TABLET | Freq: Every day | ORAL | Status: DC
  Administered 2018-02-04 – 2018-02-05 (×2): 50 mg via ORAL
  Filled 2018-02-03 (×2): qty 2

## 2018-02-03 MED ORDER — LORATADINE 10 MG PO TABS
10.0000 mg | ORAL_TABLET | Freq: Every day | ORAL | Status: DC
  Administered 2018-02-04 – 2018-02-05 (×2): 10 mg via ORAL
  Filled 2018-02-03 (×2): qty 1

## 2018-02-03 MED ORDER — ONDANSETRON HCL 4 MG PO TABS: 4.0000 mg | ORAL_TABLET | Freq: Four times a day (QID) | ORAL | Status: DC | PRN

## 2018-02-03 MED ORDER — ONDANSETRON HCL 4 MG/2ML IJ SOLN: 4.0000 mg | Freq: Four times a day (QID) | INTRAMUSCULAR | Status: DC | PRN

## 2018-02-03 MED ORDER — SODIUM CHLORIDE 0.9 % IV SOLN
INTRAVENOUS | Status: DC
  Administered 2018-02-03: 22:00:00 via INTRAVENOUS
  Administered 2018-02-03: 10 mL/h via INTRAVENOUS

## 2018-02-03 MED ORDER — HEPARIN BOLUS VIA INFUSION
2000.0000 [IU] | Freq: Once | INTRAVENOUS | Status: AC
  Administered 2018-02-03: 2000 [IU] via INTRAVENOUS
  Filled 2018-02-03: qty 2000

## 2018-02-03 MED ORDER — MAGNESIUM OXIDE 400 (241.3 MG) MG PO TABS
200.0000 mg | ORAL_TABLET | Freq: Every day | ORAL | Status: DC
  Administered 2018-02-03 – 2018-02-04 (×2): 200 mg via ORAL
  Filled 2018-02-03 (×3): qty 1

## 2018-02-03 MED ORDER — ESCITALOPRAM OXALATE 10 MG PO TABS
10.0000 mg | ORAL_TABLET | Freq: Every day | ORAL | Status: DC
  Administered 2018-02-03 – 2018-02-04 (×2): 10 mg via ORAL
  Filled 2018-02-03 (×2): qty 1

## 2018-02-03 MED ORDER — ACETAMINOPHEN 325 MG PO TABS: 650.0000 mg | ORAL_TABLET | Freq: Four times a day (QID) | ORAL | Status: DC | PRN

## 2018-02-03 MED ORDER — ACETAMINOPHEN 650 MG RE SUPP: 650.0000 mg | Freq: Four times a day (QID) | RECTAL | Status: DC | PRN

## 2018-02-03 MED ORDER — LORAZEPAM 0.5 MG PO TABS
0.5000 mg | ORAL_TABLET | Freq: Every day | ORAL | Status: DC | PRN
  Administered 2018-02-04: 0.5 mg via ORAL
  Filled 2018-02-03: qty 1

## 2018-02-03 MED ORDER — IOPAMIDOL (ISOVUE-370) INJECTION 76%
60.0000 mL | Freq: Once | INTRAVENOUS | Status: AC | PRN
  Administered 2018-02-03: 60 mL via INTRAVENOUS

## 2018-02-03 MED ORDER — POLYVINYL ALCOHOL 1.4 % OP SOLN
1.0000 [drp] | Freq: Every day | OPHTHALMIC | Status: DC | PRN
  Filled 2018-02-03: qty 15

## 2018-02-03 NOTE — Progress Notes (Signed)
ANTICOAGULATION CONSULT NOTE - Initial Consult  Pharmacy Consult for heparin Indication: pulmonary embolus  Allergies  Allergen Reactions  . Lactase Diarrhea  . Amlodipine Besy-Benazepril Hcl Swelling  . Amlodipine Besylate Swelling  . Clonidine Hydrochloride     REACTION: tired  . Lactose Intolerance (Gi) Other (See Comments)  . Valsartan Other (See Comments)    Tongue swelling   . Verapamil     Patient Measurements: Height: 5\' 2"  (157.5 cm) Weight: 174 lb (78.9 kg) IBW/kg (Calculated) : 50.1 Heparin Dosing Weight: 67.5kg  Vital Signs: Temp: 98.6 F (37 C) (10/11 1846) Temp Source: Oral (10/11 1846) BP: 132/89 (10/11 1846) Pulse Rate: 63 (10/11 1846)  Labs: No results for input(s): HGB, HCT, PLT, APTT, LABPROT, INR, HEPARINUNFRC, HEPRLOWMOCWT, CREATININE, CKTOTAL, CKMB, TROPONINI in the last 72 hours.  CrCl cannot be calculated (Patient's most recent lab result is older than the maximum 21 days allowed.).   Medical History: Past Medical History:  Diagnosis Date  . Anxiety   . Bradycardia   . Breast cancer, left breast ([place]) 1995  . Breast cancer, right breast ([place]) 2002  . CHF (congestive heart failure) ([place])   . Depression    Dr Cheryln Manly  . Discoid lupus    skin  . Full dentures   . GERD (gastroesophageal reflux disease)   . Glaucoma   . Gout   . Hx of echocardiogram    a.  Echocardiogram (03/2011): Mild LVH, EF 60-45%, grade 1 diastolic dysfunction, mild MR, mild to moderate TR, PASP 42;  b. Echo (05/2013):  Mod LVH, EF 65-70%, no WMA, Gr 1 DD, mild MR, mod TR, PASP 39  . Hx of exercise stress test    a. ETT-echocardiogram (06/08/11): Sub-optimal exercise. Test stopped early due to dizziness and hypotension. EF 60%.  Non-diagnostic.;   b. Lexiscan Myoview (06/2013):  Diaphragmatic attenuation, no ischemia, EF 61%.  Low Risk  . Hypertension   . Hypothyroidism   . IBS (irritable bowel syndrome)   . IBS (irritable bowel syndrome)   . Incontinence   . OSA  (obstructive sleep apnea) 07/20/2016   Mild with AHI 12/hr now on CPAP  . OSA on CPAP    "quit wearing mask; retested; not enough information; to be tested again 04/08/2016"  . Osteoarthritis   . Osteopenia   . Pacemaker 2009   Brady//Chronotropic incompetence with normal pacemaker function  . Thyroid disease   . Wears hearing aid    both ears     Assessment: 84 year old female presents with several weeks of shortness of breath.  She has had dyspnea on exertion as well as some lower extremity edema.  Denies any leg pain itself.  Was seen her doctor 2 days ago and had blood work done which included an elevated d-dimer which led to the patient having a chest CT today which showed a right lower lobe pulmonary embolus.  Goal of Therapy:  Heparin level 0.3-0.7 units/ml Monitor platelets by anticoagulation protocol   Plan:  Stat CBC, aPTT and pt/INR ordered Using [place] nomogram bolus heparin 2000 units then start  heparin drip at 1000 unit/hr Heparin level in 8 hours Daily CBC  Dolly Rias RPh 02/03/2018, 7:32 PM Pager [phone]

## 2018-02-03 NOTE — ED Notes (Addendum)
Assisted pt to the BR, pt ambulatory with steady gait. She uses a cane at home.  Johnson MD Hospitalist at bedside

## 2018-02-03 NOTE — ED Provider Notes (Signed)
[facility] DEPT Provider Note   CSN: [id number] Arrival date & time: 02/03/18  [place]     History   Chief Complaint Chief Complaint  Patient presents with  . Shortness of Breath    HPI Kiyah Demartini is a 84 y.o. female.  84 year old female presents with several weeks of shortness of breath.  She has had dyspnea on exertion as well as some lower extremity edema.  Denies any leg pain itself.  Was seen her doctor 2 days ago and had blood work done which included an elevated d-dimer which led to the patient having a chest CT today which showed a right lower lobe pulmonary embolus.  Patient denies any syncope or near syncope.  Was sent here for admission.     Past Medical History:  Diagnosis Date  . Anxiety   . Bradycardia   . Breast cancer, left breast ([place]) 1995  . Breast cancer, right breast ([place]) 2002  . CHF (congestive heart failure) ([place])   . Depression    Dr Cheryln Manly  . Discoid lupus    skin  . Full dentures   . GERD (gastroesophageal reflux disease)   . Glaucoma   . Gout   . Hx of echocardiogram    a.  Echocardiogram (03/2011): Mild LVH, EF 01-09%, grade 1 diastolic dysfunction, mild MR, mild to moderate TR, PASP 42;  b. Echo (05/2013):  Mod LVH, EF 65-70%, no WMA, Gr 1 DD, mild MR, mod TR, PASP 39  . Hx of exercise stress test    a. ETT-echocardiogram (06/08/11): Sub-optimal exercise. Test stopped early due to dizziness and hypotension. EF 60%.  Non-diagnostic.;   b. Lexiscan Myoview (06/2013):  Diaphragmatic attenuation, no ischemia, EF 61%.  Low Risk  . Hypertension   . Hypothyroidism   . IBS (irritable bowel syndrome)   . IBS (irritable bowel syndrome)   . Incontinence   . OSA (obstructive sleep apnea) 07/20/2016   Mild with AHI 12/hr now on CPAP  . OSA on CPAP    "quit wearing mask; retested; not enough information; to be tested again 04/08/2016"  . Osteoarthritis   . Osteopenia   . Pacemaker 2009   Brady//Chronotropic  incompetence with normal pacemaker function  . Thyroid disease   . Wears hearing aid    both ears    Patient Active Problem List   Diagnosis Date Noted  . Macrocytosis 12/21/2017  . Wart 12/21/2017  . OSA (obstructive sleep apnea) 07/20/2016  . Sigmoid diverticulitis 07/01/2016  . Acute diverticulitis   . Rectal bleeding 06/30/2016  . Impacted cerumen of right ear 03/26/2016  . Syncope 03/22/2016  . Benign essential HTN 03/22/2016  . CAD (coronary artery disease) 03/22/2016  . CKD (chronic kidney disease) stage 3, GFR 30-59 ml/min (HCC) 03/22/2016  . Depression 03/22/2016  . Exertional dyspnea 11/04/2015  . Ascending aortic aneurysm ([place]) 11/04/2015  . 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 Arthralgia 10/10/2015  . Pulmonary hypertension ([place]) 09/30/2015  . Mild persistent asthma 09/30/2015  . Irritable larynx 09/04/2015  . Rotator cuff disorder 03/06/2015  . OCD (obsessive compulsive disorder) 04/03/2013  . IBS (irritable bowel syndrome) 07/03/2012  . Dysphagia 04/13/2012  . Denture irritation 12/20/2011  . Irritable bladder 12/20/2011  . Knee pain, bilateral 12/20/2011  . Hearing loss 10/11/2011  . Pacemaker-Medtronic 05/18/2011  . Gout, unspecified 04/30/2010  . Anal fissure 02/03/2010  . Atrial fibrillation ([place]) 08/26/2009  . Sinoatrial node dysfunction ([place]) 09/17/2008  . HYPOTENSION, ORTHOSTATIC 01/29/2008  . Microscopic hematuria  08/21/2007  . Pain in joint, pelvic region and thigh 08/01/2007  . Vitamin D deficiency 05/23/2007  . Anxiety state 05/23/2007  . Hypothyroidism 03/22/2007  . GERD 03/22/2007  . Osteoarthritis 03/22/2007  . CFS (chronic fatigue syndrome) 11/17/2006  . Hypertensive heart disease 11/17/2006  . ALLERGIC RHINITIS 11/17/2006  . OSTEOPOROSIS 11/17/2006  . BREAST CANCER, HX OF 11/17/2006    Past Surgical History:  Procedure Laterality Date  . BREAST BIOPSY Left 1995  . BREAST LUMPECTOMY Left 1995   axillary node dissectoin  . CARDIAC  CATHETERIZATION N/A 02/13/2016   Procedure: Right Heart Cath;  Surgeon: Larey Dresser, MD;  Location: [facility];  Service: Cardiovascular;  Laterality: N/A;  . CATARACT EXTRACTION W/ INTRAOCULAR LENS  IMPLANT, BILATERAL Bilateral 06/2009 - 12.2915   left - right  . DORSAL COMPARTMENT RELEASE  2000   left  . HAMMER TOE SURGERY Left 2004  . HEMORRHOID BANDING    . INSERT / REPLACE / REMOVE PACEMAKER  2009  . KNEE ARTHROSCOPY Left 08/29/2012   Procedure: LEFT KNEE ARTHROSCOPY ;  Surgeon: Hessie Dibble, MD;  Location: [facility];  Service: Orthopedics;  Laterality: Left;  [name] MASTECTOMY Bilateral 09/2000   nbx  . TRIGGER FINGER RELEASE Right 07/31/2013   Procedure: EXCISE MASS RIGHT INDEX A-1 PLLLEY RELEASE A-1 RIGHT INDEX;  Surgeon: Cammie Sickle., MD;  Location: [facility];  Service: Orthopedics;  Laterality: Right;  [name] VAGINAL HYSTERECTOMY       OB History   None      Home Medications    Prior to Admission medications   Medication Sig Start Date End Date Taking? Authorizing Provider  allopurinol (ZYLOPRIM) 300 MG tablet Take 1 tablet (300 mg total) by mouth daily. 10/04/17   Plotnikov, Evie Lacks, MD  cetirizine (ZYRTEC) 10 MG tablet Take 10 mg by mouth daily as needed for allergies.    [provider]  escitalopram (LEXAPRO) 10 MG tablet TAKE 1 TABLET (10 MG) BY MOUTH DAILY AT BEDTIME. 08/01/17   Arfeen, Arlyce Harman, MD  feeding supplement, ENSURE ENLIVE, (ENSURE ENLIVE) LIQD Take 237 mLs by mouth 2 (two) times daily between meals. 03/23/16   Reyne Dumas, MD  fluticasone furoate-vilanterol (BREO ELLIPTA) 100-25 MCG/INH AEPB Inhale 1 puff into the lungs daily. 01/25/18   Brand Males, MD  levothyroxine (SYNTHROID, LEVOTHROID) 100 MCG tablet Take 1 tablet (100 mcg total) by mouth daily. Patient not taking: Reported on 02/01/2018 10/20/17   Plotnikov, Evie Lacks, MD  LORazepam (ATIVAN) 0.5 MG tablet Take 1 tab daily as needed for anxiety  01/10/18   Arfeen, Arlyce Harman, MD  Magnesium Oxide 200 MG TABS Take 1 tablet (200 mg total) by mouth daily. 09/16/17   Larey Dresser, MD  mirabegron ER (MYRBETRIQ) 50 MG TB24 tablet Take 50 mg by mouth daily.    [provider]  psyllium (METAMUCIL SMOOTH TEXTURE) 28 % packet Take 1 packet by mouth daily with breakfast.    [provider]  REFRESH TEARS 0.5 % SOLN 1-2 DROPS IN [facility] EYE TWICE DAILY 02/07/17   [provider]  spironolactone (ALDACTONE) 25 MG tablet Take 25 mg by mouth daily.    [provider]  Turmeric 500 MG TABS Take 1 tablet by mouth 2 (two) times daily. 02/21/17   Plotnikov, Evie Lacks, MD    Family History Family History  Problem Relation Age of Onset  . Ovarian cancer Mother   . Heart disease  Father   . Stroke Sister   . Stroke Brother   . Cancer Brother   . Heart disease Brother     Social History Social History   Tobacco Use  . Smoking status: Never Smoker  . Smokeless tobacco: Never Used  Substance Use Topics  . Alcohol use: No    Alcohol/week: 0.0 standard drinks  . Drug use: No     Allergies   Lactase; Amlodipine besy-benazepril hcl; Amlodipine besylate; Clonidine hydrochloride; Lactose intolerance (gi); Valsartan; and Verapamil   Review of Systems Review of Systems  All other systems reviewed and are negative.    Physical Exam Updated Vital Signs BP 132/89 (BP Location: Right Arm)   Pulse 63   Temp 98.6 F (37 C) (Oral)   Resp 17   Ht 1.575 m (5\' 2" )   Wt 78.9 kg   SpO2 100%   BMI 31.83 kg/m   Physical Exam  Constitutional: She is oriented to person, place, and time. She appears well-developed and well-nourished.  Non-toxic appearance. No distress.  HENT:  Head: Normocephalic and atraumatic.  Eyes: Pupils are equal, round, and reactive to light. Conjunctivae, EOM and lids are normal.  Neck: Normal range of motion. Neck supple. No tracheal deviation present. No thyroid mass present.    Cardiovascular: Normal rate, regular rhythm and normal heart sounds. Exam reveals no gallop.  No murmur heard. Pulmonary/Chest: Effort normal and breath sounds normal. No stridor. No respiratory distress. She has no decreased breath sounds. She has no wheezes. She has no rhonchi. She has no rales.  Abdominal: Soft. Normal appearance and bowel sounds are normal. She exhibits no distension. There is no tenderness. There is no rebound and no CVA tenderness.  Musculoskeletal: Normal range of motion. She exhibits no edema or tenderness.  Neurological: She is alert and oriented to person, place, and time. She has normal strength. No cranial nerve deficit or sensory deficit. GCS eye subscore is 4. GCS verbal subscore is 5. GCS motor subscore is 6.  Skin: Skin is warm and dry. No abrasion and no rash noted.  Psychiatric: She has a normal mood and affect. Her speech is normal and behavior is normal.  Nursing note and vitals reviewed.    ED Treatments / Results  Labs (all labs ordered are listed, but only abnormal results are displayed) Labs Reviewed  CBC WITH DIFFERENTIAL/PLATELET  BASIC METABOLIC PANEL  APTT  PROTIME-INR    EKG None  Radiology Ct Angio Chest W/cm &/or Wo Cm  Result Date: 02/03/2018 CLINICAL DATA:  Shortness of breath. Personal history of breast cancer. Status post bilateral mastectomy. EXAM: CT ANGIOGRAPHY CHEST WITH CONTRAST TECHNIQUE: Multidetector CT imaging of the chest was performed using the standard protocol during bolus administration of intravenous contrast. Multiplanar CT image reconstructions and MIPs were obtained to evaluate the vascular anatomy. CONTRAST:  44mL ISOVUE-370 IOPAMIDOL (ISOVUE-370) INJECTION 76% COMPARISON:  None. FINDINGS: Cardiovascular: Heart is mildly enlarged. Atherosclerotic calcifications are again noted at the aorta. Coronary artery calcifications are present. Ectasias of the ascending aorta is again seen, measuring up to 3.8 cm on coronal  images. There is a common origin of the left common carotid artery in the innominate artery. Pulmonary artery opacification is excellent. Nonocclusive emboli are present within the right lower lobar artery extending into the lateral posterior basilar segments. No other focal filling defects are present. Mediastinum/Nodes: No significant mediastinal, axillary, or hilar adenopathy is present. Lungs/Pleura: The lungs are clear without focal nodule, mass, or airspace disease. No significant  pleural effusion is present. Upper Abdomen: Large gallstones are again seen. No focal inflammatory changes are present to suggest cholecystitis. A small hiatal hernia is present. The upper abdomen is otherwise within normal limits. Musculoskeletal: Vertebral body heights and alignment are normal. No focal lytic or blastic lesions are present. The ribs are within normal limits. Review of the MIP images confirms the above findings. IMPRESSION: 1. Right lower lobar pulmonary embolus. 2. No right heart strain. 3. Mild cardiomegaly without failure. 4. Coronary artery disease. 5.  Aortic Atherosclerosis (ICD10-I70.0). 6. 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 7. Cholelithiasis without cholecystitis. 8. Small hiatal hernia. Electronically Signed   By: San Morelle M.D.   On: 02/03/2018 16:38    Procedures Procedures (including critical care time)  Medications Ordered in ED Medications  0.9 %  sodium chloride infusion (has no administration in time range)     Initial Impression / Assessment and Plan / ED Course  I have reviewed the triage vital signs and the nursing notes.  Pertinent labs & imaging results that were available during my care of the patient were reviewed by me and considered in my medical decision making (see chart for details).    Patient is chest CT reviewed and does show right lower lobe PE.  Will be started on heparin per pharmacy and will admit to the hospitalist  service  Final Clinical Impressions(s) / ED Diagnoses   Final diagnoses:  None    ED Discharge Orders    None       Lacretia Leigh, MD 02/03/18 1927

## 2018-02-03 NOTE — H&P (Addendum)
History and Physical    [name] FIE:[id number] DOB: May 12, 1929 DOA: 02/03/2018  PCP: Cassandria Anger, MD   Patient coming from: home  I have personally briefly reviewed patient's old medical records in [place]  Chief Complaint: SOb  HPI: [name] is a 84 y.o. female with medical history significant of discoid lupus, remote breast cancer x2, anxiety, presents with shortness of breath.  Patient states that shortness of breath for greater than 6 months.  She has seen many doctors including her primary care provider, cardiologist, and lung doctor.  About 2 days ago patient had blood work done that showed a positive d-dimer.  She had a CT scan of her chest done today that showed a right lower lobe pneumonia.  She was called and told to come to the ED for admission.  States she got diagnosed with lupus she thinks about 5 years ago.  She has no family history of lupus other than herself.  Her most recent breast cancer was 2002.  Her first diagnosis was in 1995.  She is status post bilateral mastectomy.  She has had some tingling and pain in her lower extremities which her doctors have attributed to neuropathy.  She states her right leg does become edematous throughout the day and it decreases overnight with rest . that has been ongoing for a few months.  Patient had an echo done in September that showed a EF of 60-65 % and no evidence of pulmonary hypertension   ED Course: She was started on a heparin drip in the emergency room.  Review of Systems: Denies chest pain.  Positive for IBS symptoms chronically All others reviewed with patient  and are  negative unless otherwise stated  Past Medical History:  Diagnosis Date  . Anxiety   . Bradycardia   . Breast cancer, left breast ([place]) 1995  . Breast cancer, right breast ([place]) 2002  . CHF (congestive heart failure) ([place])   . Depression    Dr Cheryln Manly  . Discoid lupus    skin  . Full dentures   . GERD (gastroesophageal  reflux disease)   . Glaucoma   . Gout   . Hx of echocardiogram    a.  Echocardiogram (03/2011): Mild LVH, EF 16-60%, grade 1 diastolic dysfunction, mild MR, mild to moderate TR, PASP 42;  b. Echo (05/2013):  Mod LVH, EF 65-70%, no WMA, Gr 1 DD, mild MR, mod TR, PASP 39  . Hx of exercise stress test    a. ETT-echocardiogram (06/08/11): Sub-optimal exercise. Test stopped early due to dizziness and hypotension. EF 60%.  Non-diagnostic.;   b. Lexiscan Myoview (06/2013):  Diaphragmatic attenuation, no ischemia, EF 61%.  Low Risk  . Hypertension   . Hypothyroidism   . IBS (irritable bowel syndrome)   . IBS (irritable bowel syndrome)   . Incontinence   . OSA (obstructive sleep apnea) 07/20/2016   Mild with AHI 12/hr now on CPAP  . OSA on CPAP    "quit wearing mask; retested; not enough information; to be tested again 04/08/2016"  . Osteoarthritis   . Osteopenia   . Pacemaker 2009   Brady//Chronotropic incompetence with normal pacemaker function  . Thyroid disease   . Wears hearing aid    both ears    Past Surgical History:  Procedure Laterality Date  . BREAST BIOPSY Left 1995  . BREAST LUMPECTOMY Left 1995   axillary node dissectoin  . CARDIAC CATHETERIZATION N/A 02/13/2016   Procedure: Right Heart Cath;  Surgeon: Larey Dresser, MD;  Location: [facility];  Service: Cardiovascular;  Laterality: N/A;  . CATARACT EXTRACTION W/ INTRAOCULAR LENS  IMPLANT, BILATERAL Bilateral 06/2009 - 12.2915   left - right  . DORSAL COMPARTMENT RELEASE  2000   left  . HAMMER TOE SURGERY Left 2004  . HEMORRHOID BANDING    . INSERT / REPLACE / REMOVE PACEMAKER  2009  . KNEE ARTHROSCOPY Left 08/29/2012   Procedure: LEFT KNEE ARTHROSCOPY ;  Surgeon: Hessie Dibble, MD;  Location: [facility];  Service: Orthopedics;  Laterality: Left;  [name] MASTECTOMY Bilateral 09/2000   nbx  . TRIGGER FINGER RELEASE Right 07/31/2013   Procedure: EXCISE MASS RIGHT INDEX A-1 PLLLEY RELEASE A-1 RIGHT INDEX;   Surgeon: Cammie Sickle., MD;  Location: [facility];  Service: Orthopedics;  Laterality: Right;  [name] VAGINAL HYSTERECTOMY       reports that she has never smoked. She has never used smokeless tobacco. She reports that she does not drink alcohol or use drugs.  She is vegan, she lives in assisted living senior residence  Allergies  Allergen Reactions  . Lactase Diarrhea  . Amlodipine Besy-Benazepril Hcl Swelling  . Amlodipine Besylate Swelling  . Clonidine Hydrochloride     REACTION: tired  . Lactose Intolerance (Gi) Other (See Comments)  . Valsartan Other (See Comments)    Tongue swelling   . Verapamil     Family History  Problem Relation Age of Onset  . Ovarian cancer Mother   . Heart disease Father   . Stroke Sister   . Stroke Brother   . Cancer Brother   . Heart disease Brother      Prior to Admission medications   Medication Sig Start Date End Date Taking? Authorizing Provider  allopurinol (ZYLOPRIM) 300 MG tablet Take 1 tablet (300 mg total) by mouth daily. Patient taking differently: Take 300 mg by mouth daily as needed (gout).  10/04/17  Yes Plotnikov, Evie Lacks, MD  cetirizine (ZYRTEC) 10 MG tablet Take 10 mg by mouth daily as needed for allergies.   Yes [provider]  escitalopram (LEXAPRO) 10 MG tablet TAKE 1 TABLET (10 MG) BY MOUTH DAILY AT BEDTIME. Patient taking differently: Take 10 mg by mouth at bedtime. TAKE 1 TABLET (10 MG) BY MOUTH DAILY AT BEDTIME. 08/01/17  Yes Arfeen, Arlyce Harman, MD  feeding supplement, ENSURE ENLIVE, (ENSURE ENLIVE) LIQD Take 237 mLs by mouth 2 (two) times daily between meals. 03/23/16  Yes Reyne Dumas, MD  Iodine, Kelp, (KELP PO) Take 1 tablet by mouth daily.   Yes [provider]  levothyroxine (SYNTHROID, LEVOTHROID) 100 MCG tablet Take 1 tablet (100 mcg total) by mouth daily. Patient taking differently: Take 100 mcg by mouth every morning.  10/20/17  Yes Plotnikov, Evie Lacks, MD  LORazepam (ATIVAN) 0.5  MG tablet Take 1 tab daily as needed for anxiety Patient taking differently: Take 0.5 mg by mouth daily as needed for sleep. Take 1 tab daily as needed for anxiety 01/10/18  Yes Arfeen, Arlyce Harman, MD  Magnesium Oxide 200 MG TABS Take 1 tablet (200 mg total) by mouth daily. 09/16/17  Yes Larey Dresser, MD  mirabegron ER (MYRBETRIQ) 50 MG TB24 tablet Take 50 mg by mouth daily.   Yes [provider]  psyllium (METAMUCIL SMOOTH TEXTURE) 28 % packet Take 1 packet by mouth daily with breakfast.   Yes [provider]  REFRESH TEARS 0.5 % SOLN Place 1  drop into both eyes daily as needed (dry eyes).  02/07/17  Yes [provider]  spironolactone (ALDACTONE) 25 MG tablet Take 25 mg by mouth daily.   Yes [provider]  Turmeric 500 MG TABS Take 1 tablet by mouth 2 (two) times daily. 02/21/17  Yes Plotnikov, Evie Lacks, MD    Physical Exam: Vitals:   02/03/18 2100 02/03/18 2130 02/03/18 2308 02/03/18 2311  BP: 129/80 118/74 130/73   Pulse: (!) 59 63 60   Resp: 14 20 20    Temp:   98.1 F (36.7 C)   TempSrc:   Oral   SpO2: 100% 100% 97%   Weight:    79.6 kg  Height:    5\' 3"  (1.6 m)    Constitutional: NAD, calm, comfortable, appears younger than stated age Vitals:   02/03/18 2100 02/03/18 2130 02/03/18 2308 02/03/18 2311  BP: 129/80 118/74 130/73   Pulse: (!) 59 63 60   Resp: 14 20 20    Temp:   98.1 F (36.7 C)   TempSrc:   Oral   SpO2: 100% 100% 97%   Weight:    79.6 kg  Height:    5\' 3"  (1.6 m)   Eyes: PERRL, lids and conjunctivae normal ENMT: Mucous membranes are moist. Posterior pharynx clear of any exudate or lesions.Normal dentition.  Neck: normal, supple, no masses, no thyromegaly Respiratory: clear to auscultation bilaterally, no wheezing, no crackles. Normal respiratory effort. No accessory muscle use.  Cardiovascular: Regular rate and rhythm, no murmurs / rubs / gallops. No extremity edema. 2+ pedal pulses. Chest: Bilateral mastectomy Abdomen:  no tenderness, no masses palpated. No hepatosplenomegaly. Bowel sounds positive.  Musculoskeletal: no clubbing / cyanosis. No joint deformity upper and lower extremities. Good ROM, no contractures. Normal muscle tone.  Skin: no rashes, lesions, ulcers. No induration Neurologic: CN 2-12 grossly intact.  Moves all extremities equally Psychiatric: Normal judgment and insight. Alert and oriented x 3. Normal mood.    Labs on Admission: I have personally reviewed following labs and imaging studies  CBC: Recent Labs  Lab 02/03/18 2009  WBC 3.5*  NEUTROABS 1.7  HGB 13.0  HCT 41.1  MCV 105.1*  PLT 557   Basic Metabolic Panel: Recent Labs  Lab 02/03/18 2009  NA 139  K 4.1  CL 103  CO2 26  GLUCOSE 82  BUN 25*  CREATININE 1.01*  CALCIUM 9.5   GFR: Estimated Creatinine Clearance: 40 mL/min (A) (by C-G formula based on SCr of 1.01 mg/dL (H)). Liver Function Tests: No results for input(s): AST, ALT, ALKPHOS, BILITOT, PROT, ALBUMIN in the last 168 hours. No results for input(s): LIPASE, AMYLASE in the last 168 hours. No results for input(s): AMMONIA in the last 168 hours. Coagulation Profile: No results for input(s): INR, PROTIME in the last 168 hours. Cardiac Enzymes: No results for input(s): CKTOTAL, CKMB, CKMBINDEX, TROPONINI in the last 168 hours. BNP (last 3 results) Recent Labs    02/01/18 1156  PROBNP 152.0*   HbA1C: No results for input(s): HGBA1C in the last 72 hours. CBG: No results for input(s): GLUCAP in the last 168 hours. Lipid Profile: No results for input(s): CHOL, HDL, LDLCALC, TRIG, CHOLHDL, LDLDIRECT in the last 72 hours. Thyroid Function Tests: Recent Labs    02/01/18 1156  TSH 0.74   Anemia Panel: No results for input(s): VITAMINB12, FOLATE, FERRITIN, TIBC, IRON, RETICCTPCT in the last 72 hours. Urine analysis:    Component Value Date/Time   COLORURINE YELLOW 03/22/2016 1257  APPEARANCEUR CLEAR 03/22/2016 1257   LABSPEC 1.010 03/22/2016 1257     PHURINE 7.5 03/22/2016 1257   GLUCOSEU NEGATIVE 03/22/2016 1257   GLUCOSEU NEGATIVE 10/10/2015 1642   HGBUR NEGATIVE 03/22/2016 1257   BILIRUBINUR neg 10/13/2016 1511   KETONESUR NEGATIVE 03/22/2016 1257   PROTEINUR neg 10/13/2016 1511   PROTEINUR NEGATIVE 03/22/2016 1257   UROBILINOGEN 0.2 10/13/2016 1511   UROBILINOGEN 0.2 10/10/2015 1642   NITRITE neg 10/13/2016 1511   NITRITE NEGATIVE 03/22/2016 1257   LEUKOCYTESUR Trace (A) 10/13/2016 1511    Radiological Exams on Admission: Ct Angio Chest W/cm &/or Wo Cm  Result Date: 02/03/2018 CLINICAL DATA:  Shortness of breath. Personal history of breast cancer. Status post bilateral mastectomy. EXAM: CT ANGIOGRAPHY CHEST WITH CONTRAST TECHNIQUE: Multidetector CT imaging of the chest was performed using the standard protocol during bolus administration of intravenous contrast. Multiplanar CT image reconstructions and MIPs were obtained to evaluate the vascular anatomy. CONTRAST:  93mL ISOVUE-370 IOPAMIDOL (ISOVUE-370) INJECTION 76% COMPARISON:  None. FINDINGS: Cardiovascular: Heart is mildly enlarged. Atherosclerotic calcifications are again noted at the aorta. Coronary artery calcifications are present. Ectasias of the ascending aorta is again seen, measuring up to 3.8 cm on coronal images. There is a common origin of the left common carotid artery in the innominate artery. Pulmonary artery opacification is excellent. Nonocclusive emboli are present within the right lower lobar artery extending into the lateral posterior basilar segments. No other focal filling defects are present. Mediastinum/Nodes: No significant mediastinal, axillary, or hilar adenopathy is present. Lungs/Pleura: The lungs are clear without focal nodule, mass, or airspace disease. No significant pleural effusion is present. Upper Abdomen: Large gallstones are again seen. No focal inflammatory changes are present to suggest cholecystitis. A small hiatal hernia is present. The upper  abdomen is otherwise within normal limits. Musculoskeletal: Vertebral body heights and alignment are normal. No focal lytic or blastic lesions are present. The ribs are within normal limits. Review of the MIP images confirms the above findings. IMPRESSION: 1. Right lower lobar pulmonary embolus. 2. No right heart strain. 3. Mild cardiomegaly without failure. 4. Coronary artery disease. 5.  Aortic Atherosclerosis (ICD10-I70.0). 6. 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 7. Cholelithiasis without cholecystitis. 8. Small hiatal hernia. Electronically Signed   By: San Morelle M.D.   On: 02/03/2018 16:38    EKG: Independently reviewed.  Paced rhythm  Assessment/Plan Principal Problem:   PE (pulmonary thromboembolism) (HCC) Active Problems:   Hypothyroidism   Anxiety state   Pacemaker-[employer]   Discoid lupus   Benign essential HTN   CKD (chronic kidney disease) stage 3, GFR 30-59 ml/min (HCC) .  -IV heparin.  Patient wishes to transition to a NOAC. I  had a long discussion with patient about her risk of including her lupus and history of breast cancer, AND ADVANCED AGE . Check hypercoagulable panel.  Of note she did have a diverticular bleed IN 2018. [name]  Recommend anticoagulation 3 6 months with referral  TO  hematology.  Note patient had a normal EF on echocardiogram in September of this year, grade 1 diastolic dysfunction, check lower extremity Doppler -Continue home medications for hypertension, hypothyroidism, and anxiety     DVT prophylaxis: Heparin drip  Code Status: DNR  Disposition Plan: Home tomorrow  Consults called: None Admission status: Observation telemetry*   Nessa Ramaker Johnson-Pitts MD [facility] Pager [phone]  If 7PM-7AM, please contact night-coverage [email or web address] Password [email or web address]  02/03/2018, 11:46 PM

## 2018-02-03 NOTE — ED Notes (Addendum)
Pt eating and drinking without difficulty.  Warm blankets given to pt.  Instructed pt to call out when she is done eating to place purewick in place.

## 2018-02-03 NOTE — ED Notes (Signed)
Pt care assumed, obtained verbal report. Pt is resting and appears comfortable.  She denies any cp, SOB, or dizziness.  She is A&Ox 4.

## 2018-02-03 NOTE — ED Notes (Signed)
Heparin gtt verified by Davy Pique RN

## 2018-02-03 NOTE — Progress Notes (Signed)
Reviewed with patient's daughter; she does have a PE: they agree to take her to [facility] for further evaluation tonight. Will notify [facility];

## 2018-02-03 NOTE — Telephone Encounter (Signed)
Please let the daughter know they do have the option to walk in for CT at [place] at [facility]; must be there by 3:30; order is in place. May still have a wait but not ER.

## 2018-02-03 NOTE — ED Notes (Signed)
Bed: WA02 Expected date:  Expected time:  Means of arrival:  Comments: [name]

## 2018-02-03 NOTE — ED Triage Notes (Signed)
Pt from doctors office where she was told that she has a clot in her lung and needs to come to ED for further evaluation.  Complains of SHOB.

## 2018-02-04 ENCOUNTER — Observation Stay (HOSPITAL_BASED_OUTPATIENT_CLINIC_OR_DEPARTMENT_OTHER): Payer: Medicare Other

## 2018-02-04 DIAGNOSIS — I2699 Other pulmonary embolism without acute cor pulmonale: Secondary | ICD-10-CM | POA: Diagnosis not present

## 2018-02-04 LAB — CBC
HCT: 38 % (ref 36.0–46.0)
Hemoglobin: 12 g/dL (ref 12.0–15.0)
MCH: 33.1 pg (ref 26.0–34.0)
MCHC: 31.6 g/dL (ref 30.0–36.0)
MCV: 104.7 fL — ABNORMAL HIGH (ref 80.0–100.0)
Platelets: 170 K/uL (ref 150–400)
RBC: 3.63 MIL/uL — ABNORMAL LOW (ref 3.87–5.11)
RDW: 13.7 % (ref 11.5–15.5)
WBC: 3.1 K/uL — ABNORMAL LOW (ref 4.0–10.5)
nRBC: 0 % (ref 0.0–0.2)

## 2018-02-04 LAB — HEPARIN LEVEL (UNFRACTIONATED)
HEPARIN UNFRACTIONATED: 0.93 [IU]/mL — AB (ref 0.30–0.70)
Heparin Unfractionated: 0.67 [IU]/mL (ref 0.30–0.70)
Heparin Unfractionated: 0.48 IU/mL (ref 0.30–0.70)

## 2018-02-04 LAB — PROTEIN C ACTIVITY

## 2018-02-04 MED ORDER — HEPARIN (PORCINE) IN NACL 100-0.45 UNIT/ML-% IJ SOLN
700.0000 [IU]/h | INTRAMUSCULAR | Status: AC
  Administered 2018-02-04: 700 [IU]/h via INTRAVENOUS
  Filled 2018-02-04: qty 250

## 2018-02-04 NOTE — Progress Notes (Signed)
ANTICOAGULATION CONSULT NOTE - Follow Up Consult  Pharmacy Consult for Heparin Indication: pulmonary embolus  Allergies  Allergen Reactions  . Lactase Diarrhea  . Amlodipine Besy-Benazepril Hcl Swelling  . Amlodipine Besylate Swelling  . Clonidine Hydrochloride     REACTION: tired  . Lactose Intolerance (Gi) Other (See Comments)  . Valsartan Other (See Comments)    Tongue swelling   . Verapamil     Patient Measurements: Height: 5\' 3"  (160 cm) Weight: 175 lb 8 oz (79.6 kg) IBW/kg (Calculated) : 52.4 Heparin Dosing Weight:   Vital Signs: Temp: 98.7 F (37.1 C) (10/12 0628) Temp Source: Oral (10/12 0628) BP: 101/65 (10/12 0628) Pulse Rate: 55 (10/12 0628)  Labs: Recent Labs    02/03/18 2009 02/04/18 0512 02/04/18 0523  HGB 13.0 12.0  --   HCT 41.1 38.0  --   PLT 178 170  --   HEPARINUNFRC  --   --  0.67  CREATININE 1.01*  --   --     Estimated Creatinine Clearance: 40 mL/min (A) (by C-G formula based on SCr of 1.01 mg/dL (H)).   Medications:  Infusions:  . sodium chloride 10 mL/hr (02/03/18 2314)  . heparin 1,000 Units/hr (02/03/18 2011)    Assessment: Patient with heparin level at goal.  No heparin issues noted.  Goal of Therapy:  Heparin level 0.3-0.7 units/ml Monitor platelets by anticoagulation protocol: Yes   Plan:  Continue heparin drip at current rate Recheck level at [place] 02/04/2018,6:37 AM

## 2018-02-04 NOTE — Progress Notes (Signed)
[facility] for heparin Indication: pulmonary embolus  Allergies  Allergen Reactions  . Lactase Diarrhea  . Amlodipine Besy-Benazepril Hcl Swelling  . Amlodipine Besylate Swelling  . Clonidine Hydrochloride     REACTION: tired  . Lactose Intolerance (Gi) Other (See Comments)  . Valsartan Other (See Comments)    Tongue swelling   . Verapamil    Patient Measurements: Height: 5\' 3"  (160 cm) Weight: 175 lb 8 oz (79.6 kg) IBW/kg (Calculated) : 52.4 Heparin Dosing Weight: 67.5kg  Vital Signs: Temp: 98.7 F (37.1 C) (10/12 1357) Temp Source: Oral (10/12 1357) BP: 130/66 (10/12 1357) Pulse Rate: 63 (10/12 1357)  Labs: Recent Labs    02/03/18 2009 02/04/18 0512 02/04/18 0523 02/04/18 1347  HGB 13.0 12.0  --   --   HCT 41.1 38.0  --   --   PLT 178 170  --   --   HEPARINUNFRC  --   --  0.67 0.93*  CREATININE 1.01*  --   --   --    Estimated Creatinine Clearance: 40 mL/min (A) (by C-G formula based on SCr of 1.01 mg/dL (H)).  Medical History: Past Medical History:  Diagnosis Date  . Anxiety   . Bradycardia   . Breast cancer, left breast ([place]) 1995  . Breast cancer, right breast ([place]) 2002  . CHF (congestive heart failure) ([place])   . Depression    Dr Cheryln Manly  . Discoid lupus    skin  . Full dentures   . GERD (gastroesophageal reflux disease)   . Glaucoma   . Gout   . Hx of echocardiogram    a.  Echocardiogram (03/2011): Mild LVH, EF 02-63%, grade 1 diastolic dysfunction, mild MR, mild to moderate TR, PASP 42;  b. Echo (05/2013):  Mod LVH, EF 65-70%, no WMA, Gr 1 DD, mild MR, mod TR, PASP 39  . Hx of exercise stress test    a. ETT-echocardiogram (06/08/11): Sub-optimal exercise. Test stopped early due to dizziness and hypotension. EF 60%.  Non-diagnostic.;   b. Lexiscan Myoview (06/2013):  Diaphragmatic attenuation, no ischemia, EF 61%.  Low Risk  . Hypertension   . Hypothyroidism   . IBS (irritable bowel syndrome)   . IBS  (irritable bowel syndrome)   . Incontinence   . OSA (obstructive sleep apnea) 07/20/2016   Mild with AHI 12/hr now on CPAP  . OSA on CPAP    "quit wearing mask; retested; not enough information; to be tested again 04/08/2016"  . Osteoarthritis   . Osteopenia   . Pacemaker 2009   Brady//Chronotropic incompetence with normal pacemaker function  . Thyroid disease   . Wears hearing aid    both ears   Assessment: 84 year old female presents with several weeks of shortness of breath.  She has had dyspnea on exertion as well as some lower extremity edema.  Denies any leg pain itself.  Was seen her doctor 2 days ago and had blood work done which included an elevated d-dimer which led to the patient having a chest CT today which showed a right lower lobe pulmonary embolus.  Began Heparin with 2000 unit bolus, 1000 units/hr  Today, 02/04/2018  First Heparin level 0523 = 0.67 units/ml, at high end of therapeutic range  Second Heparin level at 1400 = 0.93, further elevated  No problems with infusions, level drawn appropriately  Goal of Therapy:  Heparin level 0.3-0.7 units/ml Monitor platelets by anticoagulation protocol   Plan:   Reduce Heparin infusion to  700 units/hr, 7 ml/hr  Recheck Heparin level in 8 hr  Daily CBC, order daily Heparin level when at steady state  Plan change to oral anti-coagulation 10/13  Minda Ditto PharmD Pager [phone] 02/04/2018, 3:08 PM

## 2018-02-04 NOTE — Plan of Care (Signed)
Patient denies pain or other complaint this shift, states she has her usual level of shortness of breath with exertion.  Multiple family members at bedside.

## 2018-02-04 NOTE — Progress Notes (Signed)
*  Preliminary Results* Bilateral lower extremity venous duplex completed Right lower extremity is negative for DVT. Left lower extremity is positive for DVT in the posterior tibial veins.  There is no evidence of Baker's cyst bilaterally.  02/04/2018 10:14 AM Abram Sander

## 2018-02-04 NOTE — Progress Notes (Signed)
PROGRESS NOTE    [name]  YCX:[id number] DOB: Oct 21, 1929 DOA: 02/03/2018 PCP: Cassandria Anger, MD   Brief Narrative:84 y.o. female with medical history significant of discoid lupus, remote breast cancer x2, anxiety, presents with shortness of breath.  Patient states that shortness of breath for greater than 6 months.  She has seen many doctors including her primary care provider, cardiologist, and lung doctor.  About 2 days ago patient had blood work done that showed a positive d-dimer.  She had a CT scan of her chest done today that showed a right lower lobe pneumonia.  She was called and told to come to the ED for admission.  States she got diagnosed with lupus she thinks about 5 years ago.  She has no family history of lupus other than herself.  Her most recent breast cancer was 2002.  Her first diagnosis was in 1995.  She is status post bilateral mastectomy.  She has had some tingling and pain in her lower extremities which her doctors have attributed to neuropathy.  She states her right leg does become edematous throughout the day and it decreases overnight with rest . that has been ongoing for a few months.  Patient had an echo done in September that showed a EF of 60-65 % and no evidence of pulmonary hypertension  Assessment & Plan:   Principal Problem:   PE (pulmonary thromboembolism) ([place]) Active Problems:   Hypothyroidism   Anxiety state   Pacemaker-[employer]tronic   Discoid lupus   Benign essential HTN   CKD (chronic kidney disease) stage 3, GFR 30-59 ml/min (HCC)  1 acute pulmonary embolism with acute right lower extremity DVT on heparin patient has a history of lupus and breast cancer.  Continue heparin for now and oral anticoagulation tomorrow.  Recent echocardiogram September 2019 normal ejection fraction and grade 1 diastolic dysfunction.  Will ambulate her today with the staff see how she does. 2 history of hypertension continue antihypertensives 3 hypothyroidism continue  Synthroid.   DVT prophylaxis: Heparin Code Status: DNR Family Communication: Home tomorrow Disposition Plan: TBD  Consultants: None   Procedures: None Antimicrobials none  Subjective: Resting in bed Has not gotten out of bed  Objective: Vitals:   02/03/18 2130 02/03/18 2308 02/03/18 2311 02/04/18 0628  BP: 118/74 130/73  101/65  Pulse: 63 60  (!) 55  Resp: 20 20  16   Temp:  98.1 F (36.7 C)  98.7 F (37.1 C)  TempSrc:  Oral  Oral  SpO2: 100% 97%  97%  Weight:   79.6 kg   Height:   5\' 3"  (1.6 m)     Intake/Output Summary (Last 24 hours) at 02/04/2018 1213 Last data filed at 02/04/2018 1007 Gross per 24 hour  Intake 347.45 ml  Output -  Net 347.45 ml   Filed Weights   02/03/18 1848 02/03/18 2311  Weight: 78.9 kg 79.6 kg    Examination:  General exam: Appears calm and comfortable  Respiratory system: Clear to auscultation. Respiratory effort normal. Cardiovascular system: S1 & S2 heard, RRR. No JVD, murmurs, rubs, gallops or clicks. No pedal edema. Gastrointestinal system: Abdomen is nondistended, soft and nontender. No organomegaly or masses felt. Normal bowel sounds heard. Central nervous system: Alert and oriented. No focal neurological deficits. Extremities: Symmetric 5 x 5 power. Skin: No rashes, lesions or ulcers Psychiatry: Judgement and insight appear normal. Mood & affect appropriate.     Data Reviewed: I have personally reviewed following labs and imaging studies  CBC: Recent  Labs  Lab 02/03/18 2009 02/04/18 0512  WBC 3.5* 3.1*  NEUTROABS 1.7  --   HGB 13.0 12.0  HCT 41.1 38.0  MCV 105.1* 104.7*  PLT 178 440   Basic Metabolic Panel: Recent Labs  Lab 02/03/18 2009  NA 139  K 4.1  CL 103  CO2 26  GLUCOSE 82  BUN 25*  CREATININE 1.01*  CALCIUM 9.5   GFR: Estimated Creatinine Clearance: 40 mL/min (A) (by C-G formula based on SCr of 1.01 mg/dL (H)). Liver Function Tests: No results for input(s): AST, ALT, ALKPHOS, BILITOT, PROT,  ALBUMIN in the last 168 hours. No results for input(s): LIPASE, AMYLASE in the last 168 hours. No results for input(s): AMMONIA in the last 168 hours. Coagulation Profile: No results for input(s): INR, PROTIME in the last 168 hours. Cardiac Enzymes: No results for input(s): CKTOTAL, CKMB, CKMBINDEX, TROPONINI in the last 168 hours. BNP (last 3 results) Recent Labs    02/01/18 1156  PROBNP 152.0*   HbA1C: No results for input(s): HGBA1C in the last 72 hours. CBG: No results for input(s): GLUCAP in the last 168 hours. Lipid Profile: No results for input(s): CHOL, HDL, LDLCALC, TRIG, CHOLHDL, LDLDIRECT in the last 72 hours. Thyroid Function Tests: No results for input(s): TSH, T4TOTAL, FREET4, T3FREE, THYROIDAB in the last 72 hours. Anemia Panel: No results for input(s): VITAMINB12, FOLATE, FERRITIN, TIBC, IRON, RETICCTPCT in the last 72 hours. Sepsis Labs: No results for input(s): PROCALCITON, LATICACIDVEN in the last 168 hours.  No results found for this or any previous visit (from the past 240 hour(s)).       Radiology Studies: Ct Angio Chest W/cm &/or Wo Cm  Result Date: 02/03/2018 CLINICAL DATA:  Shortness of breath. Personal history of breast cancer. Status post bilateral mastectomy. EXAM: CT ANGIOGRAPHY CHEST WITH CONTRAST TECHNIQUE: Multidetector CT imaging of the chest was performed using the standard protocol during bolus administration of intravenous contrast. Multiplanar CT image reconstructions and MIPs were obtained to evaluate the vascular anatomy. CONTRAST:  44mL ISOVUE-370 IOPAMIDOL (ISOVUE-370) INJECTION 76% COMPARISON:  None. FINDINGS: Cardiovascular: Heart is mildly enlarged. Atherosclerotic calcifications are again noted at the aorta. Coronary artery calcifications are present. Ectasias of the ascending aorta is again seen, measuring up to 3.8 cm on coronal images. There is a common origin of the left common carotid artery in the innominate artery. Pulmonary  artery opacification is excellent. Nonocclusive emboli are present within the right lower lobar artery extending into the lateral posterior basilar segments. No other focal filling defects are present. Mediastinum/Nodes: No significant mediastinal, axillary, or hilar adenopathy is present. Lungs/Pleura: The lungs are clear without focal nodule, mass, or airspace disease. No significant pleural effusion is present. Upper Abdomen: Large gallstones are again seen. No focal inflammatory changes are present to suggest cholecystitis. A small hiatal hernia is present. The upper abdomen is otherwise within normal limits. Musculoskeletal: Vertebral body heights and alignment are normal. No focal lytic or blastic lesions are present. The ribs are within normal limits. Review of the MIP images confirms the above findings. IMPRESSION: 1. Right lower lobar pulmonary embolus. 2. No right heart strain. 3. Mild cardiomegaly without failure. 4. Coronary artery disease. 5.  Aortic Atherosclerosis (ICD10-I70.0). 6. 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 7. Cholelithiasis without cholecystitis. 8. Small hiatal hernia. Electronically Signed   By: San Morelle M.D.   On: 02/03/2018 16:38        Scheduled Meds: . escitalopram  10 mg Oral  QHS  . levothyroxine  100 mcg Oral QAC breakfast  . loratadine  10 mg Oral Daily  . magnesium oxide  200 mg Oral Daily  . mirabegron ER  50 mg Oral Daily  . psyllium  1 packet Oral Q breakfast  . spironolactone  25 mg Oral Daily   Continuous Infusions: . sodium chloride 10 mL/hr (02/03/18 2314)  . heparin 1,000 Units/hr (02/03/18 2011)     LOS: 0 days     Georgette Shell, MD [facility]  If 7PM-7AM, please contact night-coverage [email or web address] Password [email or web address] 02/04/2018, 12:13 PM

## 2018-02-04 NOTE — Progress Notes (Signed)
Pt offered cpap for use tonight, but she declined.  Pt was encouraged to call should she change her mind.

## 2018-02-04 NOTE — Progress Notes (Signed)
Pt. Offered CPAP to use as ordered, pt. Refused stating that she is used to wearing the nasal pillows and does not tolerate the mask.  Told patient that she can bring in her tubing and pillows from home and use our machine with her home tubing.  She stated that she will think about it.  RN notified.

## 2018-02-05 DIAGNOSIS — I2699 Other pulmonary embolism without acute cor pulmonale: Secondary | ICD-10-CM | POA: Diagnosis not present

## 2018-02-05 LAB — CBC
HCT: 36.9 % (ref 36.0–46.0)
Hemoglobin: 11.5 g/dL — ABNORMAL LOW (ref 12.0–15.0)
MCH: 32.7 pg (ref 26.0–34.0)
MCHC: 31.2 g/dL (ref 30.0–36.0)
MCV: 104.8 fL — AB (ref 80.0–100.0)
PLATELETS: 176 10*3/uL (ref 150–400)
RBC: 3.52 MIL/uL — ABNORMAL LOW (ref 3.87–5.11)
RDW: 13.9 % (ref 11.5–15.5)
WBC: 3.2 10*3/uL — ABNORMAL LOW (ref 4.0–10.5)
nRBC: 0 % (ref 0.0–0.2)

## 2018-02-05 LAB — HEPARIN LEVEL (UNFRACTIONATED): Heparin Unfractionated: 0.53 IU/mL (ref 0.30–0.70)

## 2018-02-05 MED ORDER — ELIQUIS 5 MG VTE STARTER PACK: ORAL_TABLET | ORAL | 0 refills | Status: DC

## 2018-02-05 MED ORDER — APIXABAN 5 MG PO TABS: 5.0000 mg | ORAL_TABLET | Freq: Two times a day (BID) | ORAL | Status: DC

## 2018-02-05 MED ORDER — APIXABAN 5 MG PO TABS
10.0000 mg | ORAL_TABLET | Freq: Two times a day (BID) | ORAL | Status: DC
  Administered 2018-02-05: 10 mg via ORAL
  Filled 2018-02-05: qty 2

## 2018-02-05 NOTE — Care Management Obs Status (Signed)
[place] NOTIFICATION   Patient Details  Name: [name] MRN: [id number] Date of Birth: 03-Oct-1929   Medicare Observation Status Notification Given:  Yes    Erenest Rasher, RN 02/05/2018, 9:45 AM

## 2018-02-05 NOTE — Plan of Care (Signed)
Discharge instructions reviewed with patient, questions answered, verbalized understanding.  Patient transported via wheelchair to main entrance to be taken home by family members.

## 2018-02-05 NOTE — Progress Notes (Signed)
CSW consulted for assistance with determining insurance benefits for prescriptions. RNCM discussed this with patient. No more CSW needs identified. Signing off.   Pricilla Holm, MSW, [place] [facility] [phone]

## 2018-02-05 NOTE — Progress Notes (Signed)
ANTICOAGULATION CONSULT NOTE - Follow Up Consult  Pharmacy Consult for Heparin Indication: pulmonary embolus  Allergies  Allergen Reactions  . Lactase Diarrhea  . Amlodipine Besy-Benazepril Hcl Swelling  . Amlodipine Besylate Swelling  . Clonidine Hydrochloride     REACTION: tired  . Lactose Intolerance (Gi) Other (See Comments)  . Valsartan Other (See Comments)    Tongue swelling   . Verapamil     Patient Measurements: Height: 5\' 3"  (160 cm) Weight: 175 lb 8 oz (79.6 kg) IBW/kg (Calculated) : 52.4 Heparin Dosing Weight:   Vital Signs: Temp: 98.7 F (37.1 C) (10/13 0540) Temp Source: Oral (10/13 0540) BP: 129/74 (10/13 0540) Pulse Rate: 56 (10/13 0540)  Labs: Recent Labs    02/03/18 2009 02/04/18 0512 02/04/18 0523 02/04/18 1347 02/04/18 2242 02/05/18 0352  HGB 13.0 12.0  --   --   --  11.5*  HCT 41.1 38.0  --   --   --  36.9  PLT 178 170  --   --   --  176  HEPARINUNFRC  --   --  0.67 0.93* 0.48  --   CREATININE 1.01*  --   --   --   --   --     Estimated Creatinine Clearance: 40 mL/min (A) (by C-G formula based on SCr of 1.01 mg/dL (H)).   Medications:  Infusions:  . sodium chloride 10 mL/hr at 02/05/18 0600  . heparin 700 Units/hr (02/05/18 0600)    Assessment: Patient with heparin level at goal.  No heparin issues noted.  Goal of Therapy:  Heparin level 0.3-0.7 units/ml Monitor platelets by anticoagulation protocol: Yes   Plan:  Continue heparin drip at current rate Recheck level at 0800  Tyler Deis, [place] 02/05/2018,6:26 AM

## 2018-02-05 NOTE — Discharge Summary (Signed)
Physician Discharge Summary  [name] GXQ:[id number] DOB: [birth year] DOA: 02/03/2018  PCP: Cassandria Anger, MD  Admit date: 02/03/2018 Discharge date: 02/05/2018  Admitted From: home Disposition:  home Recommendations for Outpatient Follow-up:  1. Follow up with PCP in 1-2 weeks 2. Please obtain BMP/CBC in one week  Home Health none Equipment/Devices:none Discharge Condition stable CODE STATUS:dnr Diet recommendation: cardiac Brief/Interim Summary:84 y.o.femalewith medical history significant ofdiscoid lupus, remote breast cancer x2, anxiety, presents with shortness of breath. Patient states that shortness of breath for greater than 6 months. She has seen many doctors including her primary care provider, cardiologist, and lung doctor. About 2 days ago patient had blood work done that showed a positive d-dimer. She had a CT scan of her chest done today that showed a right lower lobe pneumonia. She was called and told to come to the ED for admission. States she got diagnosed with lupus she thinks about 5 years ago. She has no family history of lupus other than herself. Her most recent breast cancer was 2002. Her first diagnosis was in 1995. She is status post bilateral mastectomy. She has had some tingling and pain in her lower extremities which her doctors have attributed to neuropathy. She states her right leg does become edematous throughout the day and it decreases overnight with rest .that has been ongoing for a few months. Patient had an echo done in September that showed a EF of 60-65% and no evidence of pulmonary hypertension   Discharge Diagnoses:  Principal Problem:   PE (pulmonary thromboembolism) ([place]) Active Problems:   Hypothyroidism   Anxiety state   Pacemaker-[employer]   Discoid lupus   Benign essential HTN   CKD (chronic kidney disease) stage 3, GFR 30-59 ml/min (HCC)  1 acute pulmonary embolism with acute right lower extremity DVT on heparin  patient has a history of lupus and breast cancer. Patient treated with IV heparin and eliquis   Recent echocardiogram September 2019 normal ejection fraction and grade 1 diastolic dysfunction.   2 history of hypertension continue antihypertensives  3 hypothyroidism continue Synthroid.   Discharge Instructions   Allergies as of 02/05/2018      Reactions   Lactase Diarrhea   Amlodipine Besy-benazepril Hcl Swelling   Amlodipine Besylate Swelling   Clonidine Hydrochloride    REACTION: tired   Lactose Intolerance (gi) Other (See Comments)   Valsartan Other (See Comments)   Tongue swelling    Verapamil       Medication List    TAKE these medications   allopurinol 300 MG tablet Commonly known as:  ZYLOPRIM Take 1 tablet (300 mg total) by mouth daily. What changed:    when to take this  reasons to take this   cetirizine 10 MG tablet Commonly known as:  ZYRTEC Take 10 mg by mouth daily as needed for allergies.   ELIQUIS STARTER PACK 5 MG Tabs Take as directed on package: start with two-5mg  tablets twice daily for 7 days. On day 8, switch to one-5mg  tablet twice daily.   escitalopram 10 MG tablet Commonly known as:  LEXAPRO TAKE 1 TABLET (10 MG) BY MOUTH DAILY AT BEDTIME. What changed:    how much to take  how to take this  when to take this   feeding supplement (ENSURE ENLIVE) Liqd Take 237 mLs by mouth 2 (two) times daily between meals.   KELP PO Take 1 tablet by mouth daily.   levothyroxine 100 MCG tablet Commonly known as:  SYNTHROID, LEVOTHROID Take 1  tablet (100 mcg total) by mouth daily. What changed:  when to take this   LORazepam 0.5 MG tablet Commonly known as:  ATIVAN Take 1 tab daily as needed for anxiety What changed:    how much to take  how to take this  when to take this  reasons to take this   Magnesium Oxide 200 MG Tabs Take 1 tablet (200 mg total) by mouth daily.   MYRBETRIQ 50 MG Tb24 tablet Generic drug:  mirabegron ER Take  50 mg by mouth daily.   psyllium 28 % packet Commonly known as:  METAMUCIL SMOOTH TEXTURE Take 1 packet by mouth daily with breakfast.   REFRESH TEARS 0.5 % Soln Generic drug:  carboxymethylcellulose Place 1 drop into both eyes daily as needed (dry eyes).   spironolactone 25 MG tablet Commonly known as:  ALDACTONE Take 25 mg by mouth daily.   Turmeric 500 MG Tabs Take 1 tablet by mouth 2 (two) times daily.      Follow-up Information    Plotnikov, Evie Lacks, MD Follow up.   Specialty:  Internal Medicine Contact information: [place] [address] [phone]          Allergies  Allergen Reactions  . Lactase Diarrhea  . Amlodipine Besy-Benazepril Hcl Swelling  . Amlodipine Besylate Swelling  . Clonidine Hydrochloride     REACTION: tired  . Lactose Intolerance (Gi) Other (See Comments)  . Valsartan Other (See Comments)    Tongue swelling   . Verapamil     Consultations:  Procedures/Studies: Dg Lumbar Spine 2-3 Views  Result Date: 01/13/2018 CLINICAL DATA:  Chronic low back pain EXAM: LUMBAR SPINE - 2-3 VIEW COMPARISON:  None. FINDINGS: Five non rib-bearing lumbar type vertebra. Mild right SI joint sclerosis. Lumbar alignment within normal limits. Vertebral body heights are normal. Mild degenerative changes at L2-L3 and L5-S1. Aortic atherosclerosis. IMPRESSION: No acute osseous abnormality.  Mild degenerative changes. Electronically Signed   By: Donavan Foil M.D.   On: 01/13/2018 17:22   Ct Angio Chest W/cm &/or Wo Cm  Result Date: 02/03/2018 CLINICAL DATA:  Shortness of breath. Personal history of breast cancer. Status post bilateral mastectomy. EXAM: CT ANGIOGRAPHY CHEST WITH CONTRAST TECHNIQUE: Multidetector CT imaging of the chest was performed using the standard protocol during bolus administration of intravenous contrast. Multiplanar CT image reconstructions and MIPs were obtained to evaluate the vascular anatomy. CONTRAST:  41mL ISOVUE-370  IOPAMIDOL (ISOVUE-370) INJECTION 76% COMPARISON:  None. FINDINGS: Cardiovascular: Heart is mildly enlarged. Atherosclerotic calcifications are again noted at the aorta. Coronary artery calcifications are present. Ectasias of the ascending aorta is again seen, measuring up to 3.8 cm on coronal images. There is a common origin of the left common carotid artery in the innominate artery. Pulmonary artery opacification is excellent. Nonocclusive emboli are present within the right lower lobar artery extending into the lateral posterior basilar segments. No other focal filling defects are present. Mediastinum/Nodes: No significant mediastinal, axillary, or hilar adenopathy is present. Lungs/Pleura: The lungs are clear without focal nodule, mass, or airspace disease. No significant pleural effusion is present. Upper Abdomen: Large gallstones are again seen. No focal inflammatory changes are present to suggest cholecystitis. A small hiatal hernia is present. The upper abdomen is otherwise within normal limits. Musculoskeletal: Vertebral body heights and alignment are normal. No focal lytic or blastic lesions are present. The ribs are within normal limits. Review of the MIP images confirms the above findings. IMPRESSION: 1. Right lower lobar pulmonary embolus. 2.  No right heart strain. 3. Mild cardiomegaly without failure. 4. Coronary artery disease. 5.  Aortic Atherosclerosis (ICD10-I70.0). 6. 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 7. Cholelithiasis without cholecystitis. 8. Small hiatal hernia. Electronically Signed   By: San Morelle M.D.   On: 02/03/2018 16:38    (Echo, Carotid, EGD, Colonoscopy, ERCP)    Subjective:   Discharge Exam: Vitals:   02/04/18 2013 02/05/18 0540  BP: 140/72 129/74  Pulse: 60 (!) 56  Resp: 16 18  Temp: 97.9 F (36.6 C) 98.7 F (37.1 C)  SpO2: 100% 99%   Vitals:   02/04/18 0628 02/04/18 1357 02/04/18 2013 02/05/18 0540  BP: 101/65  130/66 140/72 129/74  Pulse: (!) 55 63 60 (!) 56  Resp: 16 20 16 18   Temp: 98.7 F (37.1 C) 98.7 F (37.1 C) 97.9 F (36.6 C) 98.7 F (37.1 C)  TempSrc: Oral Oral Oral Oral  SpO2: 97% 99% 100% 99%  Weight:      Height:        General: Pt is alert, awake, not in acute distress Cardiovascular: RRR, S1/S2 +, no rubs, no gallops Respiratory: CTA bilaterally, no wheezing, no rhonchi Abdominal: Soft, NT, ND, bowel sounds + Extremities: no edema, no cyanosis    The results of significant diagnostics from this hospitalization (including imaging, microbiology, ancillary and laboratory) are listed below for reference.     Microbiology: No results found for this or any previous visit (from the past 240 hour(s)).   Labs: BNP (last 3 results) No results for input(s): BNP in the last 8760 hours. Basic Metabolic Panel: Recent Labs  Lab 02/03/18 2009  NA 139  K 4.1  CL 103  CO2 26  GLUCOSE 82  BUN 25*  CREATININE 1.01*  CALCIUM 9.5   Liver Function Tests: No results for input(s): AST, ALT, ALKPHOS, BILITOT, PROT, ALBUMIN in the last 168 hours. No results for input(s): LIPASE, AMYLASE in the last 168 hours. No results for input(s): AMMONIA in the last 168 hours. CBC: Recent Labs  Lab 02/03/18 2009 02/04/18 0512 02/05/18 0352  WBC 3.5* 3.1* 3.2*  NEUTROABS 1.7  --   --   HGB 13.0 12.0 11.5*  HCT 41.1 38.0 36.9  MCV 105.1* 104.7* 104.8*  PLT 178 170 176   Cardiac Enzymes: No results for input(s): CKTOTAL, CKMB, CKMBINDEX, TROPONINI in the last 168 hours. BNP: Invalid input(s): POCBNP CBG: No results for input(s): GLUCAP in the last 168 hours. D-Dimer No results for input(s): DDIMER in the last 72 hours. Hgb A1c No results for input(s): HGBA1C in the last 72 hours. Lipid Profile No results for input(s): CHOL, HDL, LDLCALC, TRIG, CHOLHDL, LDLDIRECT in the last 72 hours. Thyroid function studies No results for input(s): TSH, T4TOTAL, T3FREE, THYROIDAB in the last  72 hours.  Invalid input(s): FREET3 Anemia work up No results for input(s): VITAMINB12, FOLATE, FERRITIN, TIBC, IRON, RETICCTPCT in the last 72 hours. Urinalysis    Component Value Date/Time   COLORURINE YELLOW 03/22/2016 1257   APPEARANCEUR CLEAR 03/22/2016 1257   LABSPEC 1.010 03/22/2016 1257   PHURINE 7.5 03/22/2016 1257   GLUCOSEU NEGATIVE 03/22/2016 1257   GLUCOSEU NEGATIVE 10/10/2015 1642   HGBUR NEGATIVE 03/22/2016 1257   BILIRUBINUR neg 10/13/2016 1511   KETONESUR NEGATIVE 03/22/2016 1257   PROTEINUR neg 10/13/2016 1511   PROTEINUR NEGATIVE 03/22/2016 1257   UROBILINOGEN 0.2 10/13/2016 1511   UROBILINOGEN 0.2 10/10/2015 1642   NITRITE neg 10/13/2016 1511   NITRITE  NEGATIVE 03/22/2016 1257   LEUKOCYTESUR Trace (A) 10/13/2016 1511   Sepsis Labs Invalid input(s): PROCALCITONIN,  WBC,  LACTICIDVEN Microbiology No results found for this or any previous visit (from the past 240 hour(s)).   Time coordinating discharge: Over34 minutes  SIGNED:   Georgette Shell, MD  [facility] 02/05/2018, 11:02 AM Pager   If 7PM-7AM, please contact night-coverage [email or web address] Password [email or web address]

## 2018-02-05 NOTE — Care Management Note (Addendum)
Case Management Note  Patient Details  Name: [name] MRN: [id number] Date of Birth: 1929/09/08  Subjective/Objective:     PE               Action/Plan: NCM spoke to pt and she has [employer] for her Rx coverage. Pt's copay for Xarelto or Eliquis would run $45, unable to check benefits on weekend. Sent message to attending to send Rx to her pharmacy, and they will be able to check Rx copay. Will provide pt with a Eliquis or Xarelto 30 day free trial card prior to dc. Pt lives [address] States someone checks on her daily. She uses her cane for ambulation.   Provided pt with Eliquis 30 day free trial card. Pt copay for Eliquis at per [facility] was $35.00. [employer] transferred [employer] to [facility] on [address], they did not have starter pack for Eliquis.   Expected Discharge Date:               Expected Discharge Plan:  Home/Self Care  In-House Referral:  NA  Discharge planning Services  CM Consult, Medication Assistance  Post Acute Care Choice:  NA Choice offered to:  NA  DME Arranged:  N/A DME Agency:  NA  HH Arranged:  NA HH Agency:  NA  Status of Service:  Completed, signed off  If discussed at [place] of Stay Meetings, dates discussed:    Additional Comments:  Erenest Rasher, RN 02/05/2018, 8:37 AM

## 2018-02-05 NOTE — Discharge Instructions (Addendum)
Information on my medicine - ELIQUIS (apixaban)  This medication education was reviewed with me or my healthcare representative as part of my discharge preparation.  The pharmacist that spoke with me during my hospital stay was:  Minda Ditto, [facility]  Why was Eliquis prescribed for you? Eliquis was prescribed to treat blood clots that may have been found in the veins of your legs (deep vein thrombosis) or in your lungs (pulmonary embolism) and to reduce the risk of them occurring again.  What do You need to know about Eliquis ? The starting dose is 10 mg (two 5 mg tablets) taken TWICE daily for the FIRST SEVEN (7) DAYS, then on (enter date)  10/20  the dose is reduced to ONE 5 mg tablet taken TWICE daily.  Eliquis may be taken with or without food.   Try to take the dose about the same time in the morning and in the evening. If you have difficulty swallowing the tablet whole please discuss with your pharmacist how to take the medication safely.  Take Eliquis exactly as prescribed and DO NOT stop taking Eliquis without talking to the doctor who prescribed the medication.  Stopping may increase your risk of developing a new blood clot.  Refill your prescription before you run out.  After discharge, you should have regular check-up appointments with your healthcare provider that is prescribing your Eliquis.    What do you do if you miss a dose? If a dose of ELIQUIS is not taken at the scheduled time, take it as soon as possible on the same day and twice-daily administration should be resumed. The dose should not be doubled to make up for a missed dose.  Important Safety Information A possible side effect of Eliquis is bleeding. You should call your healthcare provider right away if you experience any of the following: ? Bleeding from an injury or your nose that does not stop. ? Unusual colored urine (red or dark brown) or unusual colored stools (red or black). ? Unusual bruising for  unknown reasons. ? A serious fall or if you hit your head (even if there is no bleeding).  Some medicines may interact with Eliquis and might increase your risk of bleeding or clotting while on Eliquis. To help avoid this, consult your healthcare provider or pharmacist prior to using any new prescription or non-prescription medications, including herbals, vitamins, non-steroidal anti-inflammatory drugs (NSAIDs) and supplements.  This website has more information on Eliquis (apixaban): http://www.eliquis.com/eliquis/home  Suggest stop Turmeric - can increase risk of bleed

## 2018-02-05 NOTE — Progress Notes (Addendum)
[place] for Heparin transitioned to Apixaban Indication: pulmonary embolus  Allergies  Allergen Reactions  . Lactase Diarrhea  . Amlodipine Besy-Benazepril Hcl Swelling  . Amlodipine Besylate Swelling  . Clonidine Hydrochloride     REACTION: tired  . Lactose Intolerance (Gi) Other (See Comments)  . Valsartan Other (See Comments)    Tongue swelling   . Verapamil    Patient Measurements: Height: 5\' 3"  (160 cm) Weight: 175 lb 8 oz (79.6 kg) IBW/kg (Calculated) : 52.4 Heparin Dosing Weight: 67.5kg  Vital Signs: Temp: 98.7 F (37.1 C) (10/13 0540) Temp Source: Oral (10/13 0540) BP: 129/74 (10/13 0540) Pulse Rate: 56 (10/13 0540)  Labs: Recent Labs    02/03/18 2009 02/04/18 0512  02/04/18 1347 02/04/18 2242 02/05/18 0352 02/05/18 0805  HGB 13.0 12.0  --   --   --  11.5*  --   HCT 41.1 38.0  --   --   --  36.9  --   PLT 178 170  --   --   --  176  --   HEPARINUNFRC  --   --    < > 0.93* 0.48  --  0.53  CREATININE 1.01*  --   --   --   --   --   --    < > = values in this interval not displayed.   Estimated Creatinine Clearance: 40 mL/min (A) (by C-G formula based on SCr of 1.01 mg/dL (H)).  Medical History: Past Medical History:  Diagnosis Date  . Anxiety   . Bradycardia   . Breast cancer, left breast ([place]) 1995  . Breast cancer, right breast ([place]) 2002  . CHF (congestive heart failure) ([place])   . Depression    Dr Cheryln Manly  . Discoid lupus    skin  . Full dentures   . GERD (gastroesophageal reflux disease)   . Glaucoma   . Gout   . Hx of echocardiogram    a.  Echocardiogram (03/2011): Mild LVH, EF 26-94%, grade 1 diastolic dysfunction, mild MR, mild to moderate TR, PASP 42;  b. Echo (05/2013):  Mod LVH, EF 65-70%, no WMA, Gr 1 DD, mild MR, mod TR, PASP 39  . Hx of exercise stress test    a. ETT-echocardiogram (06/08/11): Sub-optimal exercise. Test stopped early due to dizziness and hypotension. EF 60%.  Non-diagnostic.;    b. Lexiscan Myoview (06/2013):  Diaphragmatic attenuation, no ischemia, EF 61%.  Low Risk  . Hypertension   . Hypothyroidism   . IBS (irritable bowel syndrome)   . IBS (irritable bowel syndrome)   . Incontinence   . OSA (obstructive sleep apnea) 07/20/2016   Mild with AHI 12/hr now on CPAP  . OSA on CPAP    "quit wearing mask; retested; not enough information; to be tested again 04/08/2016"  . Osteoarthritis   . Osteopenia   . Pacemaker 2009   Brady//Chronotropic incompetence with normal pacemaker function  . Thyroid disease   . Wears hearing aid    both ears   Assessment: 84 year old female presents with several weeks of shortness of breath.  She has had dyspnea on exertion as well as some lower extremity edema.  Denies any leg pain itself.  Was seen her doctor 2 days ago and had blood work done which included an elevated d-dimer which led to the patient having a chest CT today which showed a right lower lobe pulmonary embolus.  Began Heparin with 2000 unit bolus, 1000 units/hr  Today, 02/05/2018   Hep levels in range x2 on 700 units/hr (0.48 last night) 0.53 this am  H/H, platelets unchanged  Change to Apixaban  Goal of Therapy:  Heparin level 0.3-0.7 units/ml Monitor platelets by anticoagulation protocol   Plan:   Apixaban 10mg  bid x 7 days, followed by 5mg  bid  Discontinue Heparin at time of first dose Apixaban  Counseled patient, noted worry for diverticular bleed with Apixaban  Minda Ditto PharmD Pager [phone] 02/05/2018, 8:59 AM

## 2018-02-06 ENCOUNTER — Telehealth: Payer: Self-pay | Admitting: *Deleted

## 2018-02-06 LAB — HOMOCYSTEINE: HOMOCYSTEINE-NORM: 15.2 umol/L — AB (ref 0.0–15.0)

## 2018-02-06 NOTE — Telephone Encounter (Signed)
Transition Care Management Follow-up Telephone Call   Date discharged? 02/05/18   How have you been since you were released from the hospital? Pt states she is ok..still get short winded at times   Do you understand why you were in the hospital? YES   Do you understand the discharge instructions? YES   Where were you discharged to? Home   Items Reviewed:  Medications reviewed: YES  Allergies reviewed: YES  Dietary changes reviewed: YES  Referrals reviewed: No referral needed   Functional Questionnaire:   Activities of Daily Living (ADLs):   She states she are independent in the following: ambulation, bathing and hygiene, feeding, continence, grooming, toileting and dressing    States she doesn't require assistance    Any transportation issues/concerns?: NO   Any patient concerns? YES   Confirmed importance and date/time of follow-up visits scheduled YES, appt 02/14/18  Provider Appointment booked with Dr. Alain Marion   Confirmed with patient if condition begins to worsen call PCP or go to the ER.  Patient was given the office number and encouraged to call back with question or concerns.  : YES

## 2018-02-07 LAB — CARDIOLIPIN ANTIBODIES, IGG, IGM, IGA
Anticardiolipin IgG: <9 GPL U/mL (ref 0–14)
Anticardiolipin IgM: <9 MPL U/mL (ref 0–12)

## 2018-02-07 LAB — BETA-2-GLYCOPROTEIN I ABS, IGG/M/A
Beta-2 Glyco I IgG: <9 GPI IgG units (ref 0–20)
Beta-2-Glycoprotein I IgA: <9 GPI IgA units (ref 0–25)
Beta-2-Glycoprotein I IgM: <9 GPI IgM units (ref 0–32)

## 2018-02-08 LAB — LUPUS ANTICOAGULANT PANEL
DRVVT: 35.5 s (ref 0.0–47.0)
PTT LA: 39.7 s (ref 0.0–51.9)

## 2018-02-08 LAB — PROTEIN S ACTIVITY: PROTEIN S ACTIVITY: 98 % (ref 63–140)

## 2018-02-08 LAB — PROTEIN C ACTIVITY: PROTEIN C ACTIVITY: 62 % — AB (ref 73–180)

## 2018-02-10 ENCOUNTER — Telehealth (HOSPITAL_COMMUNITY): Payer: Self-pay

## 2018-02-10 LAB — FACTOR 5 LEIDEN

## 2018-02-10 NOTE — Telephone Encounter (Signed)
Pt called to state that she is very SOB, bruising on arms. Pt complains about being short winded for last 1 year. Pt is taking spiro 25mg   Once daily and eliquis 5 mg starter pack. Pt feels frustrated that nobody has fixed her breathing and SOB. Please advise.

## 2018-02-10 NOTE — Telephone Encounter (Signed)
She has normal EF and no evidence of pulmonary HTN on Echo several weeks ago. Dr. Aundra Dubin planned to follow up yearly as her SOB is likely not cardiac in origin. She has follow up next week in APP clinic.  If her SOB is worse than baseline, she should go to ED, if her SOB is stable from when she saw Dr. Aundra Dubin several weeks ago, she should keep her appointment for next week.     Legrand Como [address]" [place], PA-C 02/10/2018 4:15 PM

## 2018-02-13 LAB — PROTHROMBIN GENE MUTATION

## 2018-02-14 ENCOUNTER — Encounter: Payer: Self-pay | Admitting: Internal Medicine

## 2018-02-14 ENCOUNTER — Ambulatory Visit (INDEPENDENT_AMBULATORY_CARE_PROVIDER_SITE_OTHER): Payer: Medicare Other | Admitting: Internal Medicine

## 2018-02-14 DIAGNOSIS — F330 Major depressive disorder, recurrent, mild: Secondary | ICD-10-CM

## 2018-02-14 DIAGNOSIS — R5382 Chronic fatigue, unspecified: Secondary | ICD-10-CM

## 2018-02-14 DIAGNOSIS — F320 Major depressive disorder, single episode, mild: Secondary | ICD-10-CM

## 2018-02-14 DIAGNOSIS — N183 Chronic kidney disease, stage 3 unspecified: Secondary | ICD-10-CM

## 2018-02-14 DIAGNOSIS — G9332 Myalgic encephalomyelitis/chronic fatigue syndrome: Secondary | ICD-10-CM

## 2018-02-14 DIAGNOSIS — I480 Paroxysmal atrial fibrillation: Secondary | ICD-10-CM | POA: Diagnosis not present

## 2018-02-14 DIAGNOSIS — I10 Essential (primary) hypertension: Secondary | ICD-10-CM

## 2018-02-14 DIAGNOSIS — I2699 Other pulmonary embolism without acute cor pulmonale: Secondary | ICD-10-CM | POA: Diagnosis not present

## 2018-02-14 MED ORDER — SPIRONOLACTONE 25 MG PO TABS: 25.0000 mg | ORAL_TABLET | Freq: Every day | ORAL | 3 refills | Status: DC

## 2018-02-14 MED ORDER — LORAZEPAM 0.5 MG PO TABS: ORAL_TABLET | ORAL | 3 refills | Status: DC

## 2018-02-14 MED ORDER — LEVOTHYROXINE SODIUM 100 MCG PO TABS: 100.0000 ug | ORAL_TABLET | Freq: Every day | ORAL | 3 refills | Status: DC

## 2018-02-14 MED ORDER — ESCITALOPRAM OXALATE 10 MG PO TABS: ORAL_TABLET | ORAL | 3 refills | Status: DC

## 2018-02-14 MED ORDER — APIXABAN 5 MG PO TABS: 5.0000 mg | ORAL_TABLET | Freq: Two times a day (BID) | ORAL | 1 refills | Status: DC

## 2018-02-14 NOTE — Assessment & Plan Note (Signed)
Dr Adele Schilder Dr Wilfred Lacy

## 2018-02-14 NOTE — Progress Notes (Signed)
Subjective:  Patient ID: [name], female    DOB: 1929-11-30  Age: 84 y.o. MRN: [id number]  CC: No chief complaint on file.   HPI Rey Fors presents for PE, anxiety, OAB f/u Post-hosp f/u - d/c'd on 10/13. Per d/c:    "PE (pulmonary thromboembolism) ([place]) Active Problems:   Hypothyroidism   Anxiety state   Pacemaker-[employer]   Discoid lupus   Benign essential HTN   CKD (chronic kidney disease) stage 3, GFR 30-59 ml/min (HCC)  1 acute pulmonary embolism with acute right lower extremity DVT on heparin patient has a history of lupus and breast cancer. Patient treated with IV heparin and eliquis  Recent echocardiogram September 2019 normal ejection fraction and grade 1 diastolic dysfunction.   2 history of hypertension continue antihypertensives  3 hypothyroidism continue Synthroid. "  Outpatient Medications Prior to Visit  Medication Sig Dispense Refill  . cetirizine (ZYRTEC) 10 MG tablet Take 10 mg by mouth daily as needed for allergies.    [name] ELIQUIS STARTER PACK (ELIQUIS STARTER PACK) 5 MG TABS Take as directed on package: start with two-5mg  tablets twice daily for 7 days. On day 8, switch to one-5mg  tablet twice daily. 1 each 0  . escitalopram (LEXAPRO) 10 MG tablet TAKE 1 TABLET (10 MG) BY MOUTH DAILY AT BEDTIME. (Patient taking differently: Take 10 mg by mouth at bedtime. TAKE 1 TABLET (10 MG) BY MOUTH DAILY AT BEDTIME.) 90 tablet 1  . feeding supplement, ENSURE ENLIVE, (ENSURE ENLIVE) LIQD Take 237 mLs by mouth 2 (two) times daily between meals. 237 mL 12  . Iodine, Kelp, (KELP PO) Take 1 tablet by mouth daily.    [name] levothyroxine (SYNTHROID, LEVOTHROID) 100 MCG tablet Take 1 tablet (100 mcg total) by mouth daily. (Patient taking differently: Take 100 mcg by mouth every morning. ) 90 tablet 1  . LORazepam (ATIVAN) 0.5 MG tablet Take 1 tab daily as needed for anxiety (Patient taking differently: Take 0.5 mg by mouth daily as needed for sleep. Take 1 tab daily as needed  for anxiety) 30 tablet 0  . Magnesium Oxide 200 MG TABS Take 1 tablet (200 mg total) by mouth daily. 90 tablet 3  . mirabegron ER (MYRBETRIQ) 50 MG TB24 tablet Take 50 mg by mouth daily.    . psyllium (METAMUCIL SMOOTH TEXTURE) 28 % packet Take 1 packet by mouth daily with breakfast.    . REFRESH TEARS 0.5 % SOLN Place 1 drop into both eyes daily as needed (dry eyes).   0  . spironolactone (ALDACTONE) 25 MG tablet Take 25 mg by mouth daily.    [name] allopurinol (ZYLOPRIM) 300 MG tablet Take 1 tablet (300 mg total) by mouth daily. (Patient taking differently: Take 300 mg by mouth daily as needed (gout). ) 30 tablet 5  . Turmeric 500 MG TABS Take 1 tablet by mouth 2 (two) times daily. 100 tablet 3   No facility-administered medications prior to visit.     ROS: Review of Systems  Constitutional: Positive for fatigue. Negative for activity change, appetite change, chills and unexpected weight change.  HENT: Negative for congestion, mouth sores and sinus pressure.   Eyes: Negative for visual disturbance.  Respiratory: Positive for shortness of breath. Negative for cough and chest tightness.   Cardiovascular: Negative for chest pain.  Gastrointestinal: Negative for abdominal pain and nausea.  Genitourinary: Negative for difficulty urinating, frequency and vaginal pain.  Musculoskeletal: Negative for back pain and gait problem.  Skin: Negative for pallor and rash.  Neurological: Negative for dizziness, tremors, weakness, numbness and headaches.  Psychiatric/Behavioral: Negative for confusion, sleep disturbance and suicidal ideas. The patient is nervous/anxious.     Objective:  BP 124/78 (BP Location: Right Arm, Patient Position: Sitting, Cuff Size: Normal)   Pulse 81   Temp 98.2 F (36.8 C) (Oral)   Ht 5\' 3"  (1.6 m)   Wt 176 lb (79.8 kg)   SpO2 99%   BMI 31.18 kg/m   BP Readings from Last 3 Encounters:  02/14/18 124/78  02/05/18 129/74  02/01/18 116/68    Wt Readings from Last 3  Encounters:  02/14/18 176 lb (79.8 kg)  02/03/18 175 lb 8 oz (79.6 kg)  02/01/18 176 lb (79.8 kg)    Physical Exam  Constitutional: She appears well-developed. No distress.  HENT:  Head: Normocephalic.  Right Ear: External ear normal.  Left Ear: External ear normal.  Nose: Nose normal.  Mouth/Throat: Oropharynx is clear and moist.  Eyes: Pupils are equal, round, and reactive to light. Conjunctivae are normal. Right eye exhibits no discharge. Left eye exhibits no discharge.  Neck: Normal range of motion. Neck supple. No JVD present. No tracheal deviation present. No thyromegaly present.  Cardiovascular: Normal rate, regular rhythm and normal heart sounds.  Pulmonary/Chest: No stridor. No respiratory distress. She has no wheezes.  Abdominal: Soft. Bowel sounds are normal. She exhibits no distension and no mass. There is no tenderness. There is no rebound and no guarding.  Musculoskeletal: She exhibits no edema or tenderness.  Lymphadenopathy:    She has no cervical adenopathy.  Neurological: She displays normal reflexes. No cranial nerve deficit. She exhibits normal muscle tone. Coordination normal.  Skin: No rash noted. No erythema.  Psychiatric: She has a normal mood and affect. Her behavior is normal. Judgment and thought content normal.  cane  Lab Results  Component Value Date   WBC 3.2 (L) 02/05/2018   HGB 11.5 (L) 02/05/2018   HCT 36.9 02/05/2018   PLT 176 02/05/2018   GLUCOSE 82 02/03/2018   CHOL 174 05/11/2010   TRIG 78.0 05/11/2010   HDL 52.30 05/11/2010   LDLDIRECT 130.8 09/30/2006   LDLCALC 106 (H) 05/11/2010   ALT 9 11/29/2017   AST 15 11/29/2017   NA 139 02/03/2018   K 4.1 02/03/2018   CL 103 02/03/2018   CREATININE 1.01 (H) 02/03/2018   BUN 25 (H) 02/03/2018   CO2 26 02/03/2018   TSH 0.74 02/01/2018   INR 1.08 06/30/2016   HGBA1C 6.0 04/10/2012    Ct Angio Chest W/cm &/or Wo Cm  Result Date: 02/03/2018 CLINICAL DATA:  Shortness of breath. Personal  history of breast cancer. Status post bilateral mastectomy. EXAM: CT ANGIOGRAPHY CHEST WITH CONTRAST TECHNIQUE: Multidetector CT imaging of the chest was performed using the standard protocol during bolus administration of intravenous contrast. Multiplanar CT image reconstructions and MIPs were obtained to evaluate the vascular anatomy. CONTRAST:  46mL ISOVUE-370 IOPAMIDOL (ISOVUE-370) INJECTION 76% COMPARISON:  None. FINDINGS: Cardiovascular: Heart is mildly enlarged. Atherosclerotic calcifications are again noted at the aorta. Coronary artery calcifications are present. Ectasias of the ascending aorta is again seen, measuring up to 3.8 cm on coronal images. There is a common origin of the left common carotid artery in the innominate artery. Pulmonary artery opacification is excellent. Nonocclusive emboli are present within the right lower lobar artery extending into the lateral posterior basilar segments. No other focal filling defects are present. Mediastinum/Nodes: No significant mediastinal, axillary, or hilar adenopathy is present. Lungs/Pleura: The lungs  are clear without focal nodule, mass, or airspace disease. No significant pleural effusion is present. Upper Abdomen: Large gallstones are again seen. No focal inflammatory changes are present to suggest cholecystitis. A small hiatal hernia is present. The upper abdomen is otherwise within normal limits. Musculoskeletal: Vertebral body heights and alignment are normal. No focal lytic or blastic lesions are present. The ribs are within normal limits. Review of the MIP images confirms the above findings. IMPRESSION: 1. Right lower lobar pulmonary embolus. 2. No right heart strain. 3. Mild cardiomegaly without failure. 4. Coronary artery disease. 5.  Aortic Atherosclerosis (ICD10-I70.0). 6. 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 7. Cholelithiasis without cholecystitis. 8. Small hiatal hernia. Electronically Signed   By:  San Morelle M.D.   On: 02/03/2018 16:38    Assessment & Plan:   There are no diagnoses linked to this encounter.   No orders of the defined types were placed in this encounter.    Follow-up: No follow-ups on file.  Walker Kehr, MD

## 2018-02-14 NOTE — Assessment & Plan Note (Signed)
RLL PE On Eliquis

## 2018-02-14 NOTE — Assessment & Plan Note (Signed)
RLL PE  On Eliquis

## 2018-02-14 NOTE — Patient Instructions (Signed)
Pulmonary Embolism A pulmonary embolism (PE) is a sudden blockage or decrease of blood flow in one lung or both lungs. Most blockages come from a blood clot that forms in a lower leg, thigh, or arm vein (deep vein thrombosis, DVT) and travels to the lungs. A clot is blood that has thickened into a gel or solid. PE is a dangerous and life-threatening condition that needs to be treated right away. What are the causes? This condition is usually caused by a blood clot that forms in a vein and moves to the lungs. In rare cases, it may be caused by air, fat, part of a tumor, or other tissue that moves through the veins and into the lungs. What increases the risk? The following factors may make you more likely to develop this condition:  Having DVT or a history of DVT.  Being older than age 60.  Personal or family history of blood clots or blood clotting disease.  Major or lengthy surgery.  Orthopedic surgery, especially hip or knee replacement.  Traumatic injury, such as breaking a hip or leg.  Spinal cord injury.  Stroke.  Taking medicines that contain estrogen. These include birth control pills and hormone replacement therapy.  Long-term (chronic) lung or heart disease.  Cancer and chemotherapy.  Having a central venous catheter.  Pregnancy and the period after delivery.  What are the signs or symptoms? Symptoms of this condition usually start suddenly and include:  Shortness of breath while active or at rest.  Coughing or coughing up blood or blood-tinged mucus.  Chest pain that is often worse with deep breaths.  Rapid or irregular heartbeat.  Feeling light-headed or dizzy.  Fainting.  Feeling anxious.  Sweating.  Pain and swelling in a leg. This is a symptom of DVT, which can lead to PE.  How is this diagnosed? This condition may be diagnosed based on:  Your medical history.  A physical exam.  Blood tests to check blood oxygen level and how well your blood  clots, and a D-dimer blood test, which checks your blood for a substance that is released when a blood clot breaks apart.  CT pulmonary angiogram. This test checks blood flow in and around your lungs.  Ventilation-perfusion scan, also called a lung VQ scan. This test measures air flow and blood flow to the lungs.  Ultrasound of the legs to look for blood clots.  How is this treated? Treatment for this conditions depends on many factors, such as the cause of your PE, your risk for bleeding or developing more clots, and other medical conditions you have. Treatment aims to remove, dissolve, or stop blood clots from forming or growing larger. Treatment may include:  Blood thinning medicines (anticoagulants) to stop clots from forming or growing. These medicines may be given as a pill, as an injection, or through an IV tube (infusion).  Medicines that dissolve clots (thrombolytics).  A procedure in which a flexible tube is used to remove a blood clot (embolectomy) or deliver medicine to destroy it (catheter-directed thrombolysis).  A procedure in which a filter is inserted into a large vein that carries blood to the heart (inferior vena cava). This filter (vena cava filter) catches blood clots before they reach the lungs.  Surgery to remove the clot (surgical embolectomy). This is rare.  You may need a combination of immediate, long-term (up to 3 months after diagnosis), and extended (more than 3 months after diagnosis) treatments. Your treatment may continue for several months (maintenance therapy).   You and your health care provider will work together to choose the treatment program that is best for you. Follow these instructions at home: If you are taking an anticoagulant medicine:  Take the medicine every day at the same time each day.  Understand what foods and drugs interact with your medicine.  Understand the side effects of this medicine, including excessive bruising or bleeding. Ask  your health care provider or pharmacist about other side effects. General instructions  Take over-the-counter and prescription medicines only as told by your health care provider.  Anticoagulant medicines may cause side effects, including easy bruising and difficulty stopping bleeding. If you are prescribed an anticoagulant: ? Hold pressure over cuts for longer than usual. ? Tell your dentist and other health care providers that you are taking anticoagulants before you have any procedure that may cause bleeding. ? Avoid contact sports. ? Be extra careful when handling sharp objects. ? Use a soft toothbrush. Floss with waxed dental floss. ? Shave with an electric razor.  Wear a medical alert bracelet or carry a medical alert card that says you have had a PE.  Ask your health care provider when you may return to your normal activities.  Talk with your health care provider about any travel plans. It is important to make sure that you are still able to take your medicine while on trips.  Keep all follow-up visits as told by your health care provider. This is important. How is this prevented? Take these actions to lower your risk of developing another PE:  Exercise regularly. Take frequent walks. For at least 30 minutes every day, engage in: ? Activity that involves moving your arms and legs. ? Activity that encourages good blood flow through your body by increasing your heart rate.  While traveling, drink plenty of water and avoid drinking alcohol. Ask your health care provider if you should wear below-the-knee compression stockings.  Avoid sitting or lying in bed for long periods of time without moving your legs. Exercise your arms and legs every hour during long-distance travel (over 4 hours).  If you are hospitalized or have surgery, ask your health care provider about your risks and what treatments can help prevent blood clots.  Maintain a healthy weight. Ask your health care  provider what weight is healthy for you.  If you are a woman who is over age 35, avoid unnecessary use of medicines that contain estrogen, including birth control pills.  Do not use any products that contain nicotine or tobacco, such as cigarettes and e-cigarettes. This is especially important if you take estrogen medicines. If you need help quitting, ask your health care provider.  See your health care provider for regular checkups. This may include blood tests and ultrasound testing on your legs to check for new blood clots.  Contact a health care provider if:  You missed a dose of your blood thinner medicine. Get help right away if:  You have new or increased pain, swelling, warmth, or redness in an arm or leg.  You have numbness or tingling in an arm or leg.  You have shortness of breath while active or at rest.  You have chest pain.  You have a rapid or irregular heartbeat.  You feel light-headed or dizzy.  You cough up blood.  You have blood in your vomit, stool, or urine.  You have a fever.  You have abdomen (abdominal) pain.  You have a severe fall or head injury.  You have a   severe headache.  You have vision changes.  You cannot move your arms or legs.  You are confused or have memory loss.  You are bleeding for 10 minutes or more, even with strong pressure on the wound. These symptoms may represent a serious problem that is an emergency. Do not wait to see if the symptoms will go away. Get medical help right away. Call your local emergency services (911 in the [place] Do not drive yourself to the hospital. Summary  A pulmonary embolism (PE) is a sudden blockage or decrease of blood flow in one lung or both lungs. PE is a dangerous and life-threatening condition that needs to be treated right away.  Having deep vein thrombosis (DVT) or a history of DVT is the most common risk factor for PE.  Treatments for this condition usually include medicines to thin  your blood (anticoagulants) or medicines to break apart blood clots (thrombolytics).  If you are prescribed blood thinners, it is important to take the medicine every single day at the same time each day.  If you have signs of PE or DVT, call your local emergency services (911 in the [place] This information is not intended to replace advice given to you by your health care provider. Make sure you discuss any questions you have with your health care provider. Document Released: 04/09/2000 Document Revised: 05/15/2016 Document Reviewed: 05/15/2016 Elsevier Interactive Patient Education  2018 [employer]  

## 2018-02-14 NOTE — Assessment & Plan Note (Signed)
No change 

## 2018-02-14 NOTE — Assessment & Plan Note (Signed)
Spironolactone

## 2018-02-14 NOTE — Assessment & Plan Note (Signed)
monitoring BMET

## 2018-02-15 ENCOUNTER — Ambulatory Visit: Payer: Medicare Other | Admitting: Podiatry

## 2018-02-16 ENCOUNTER — Ambulatory Visit (HOSPITAL_COMMUNITY)
Admission: RE | Admit: 2018-02-16 | Discharge: 2018-02-16 | Disposition: A | Discharge status: Home / Self Care | Payer: Medicare Other | Source: Ambulatory Visit | Attending: Internal Medicine | Admitting: Internal Medicine

## 2018-02-16 ENCOUNTER — Encounter (HOSPITAL_COMMUNITY): Payer: Self-pay

## 2018-02-16 VITALS — BP 142/78 | HR 75 | Wt 175.0 lb

## 2018-02-16 DIAGNOSIS — F329 Major depressive disorder, single episode, unspecified: Secondary | ICD-10-CM | POA: Diagnosis not present

## 2018-02-16 DIAGNOSIS — E039 Hypothyroidism, unspecified: Secondary | ICD-10-CM | POA: Insufficient documentation

## 2018-02-16 DIAGNOSIS — G4733 Obstructive sleep apnea (adult) (pediatric): Secondary | ICD-10-CM | POA: Diagnosis not present

## 2018-02-16 DIAGNOSIS — Z79899 Other long term (current) drug therapy: Secondary | ICD-10-CM | POA: Diagnosis not present

## 2018-02-16 DIAGNOSIS — Z86718 Personal history of other venous thrombosis and embolism: Secondary | ICD-10-CM | POA: Insufficient documentation

## 2018-02-16 DIAGNOSIS — Z7901 Long term (current) use of anticoagulants: Secondary | ICD-10-CM | POA: Insufficient documentation

## 2018-02-16 DIAGNOSIS — I712 Thoracic aortic aneurysm, without rupture: Secondary | ICD-10-CM | POA: Diagnosis not present

## 2018-02-16 DIAGNOSIS — Z823 Family history of stroke: Secondary | ICD-10-CM | POA: Insufficient documentation

## 2018-02-16 DIAGNOSIS — Z8249 Family history of ischemic heart disease and other diseases of the circulatory system: Secondary | ICD-10-CM | POA: Insufficient documentation

## 2018-02-16 DIAGNOSIS — I10 Essential (primary) hypertension: Secondary | ICD-10-CM | POA: Insufficient documentation

## 2018-02-16 DIAGNOSIS — Z79890 Hormone replacement therapy (postmenopausal): Secondary | ICD-10-CM | POA: Insufficient documentation

## 2018-02-16 DIAGNOSIS — R0609 Other forms of dyspnea: Secondary | ICD-10-CM | POA: Diagnosis not present

## 2018-02-16 DIAGNOSIS — I5032 Chronic diastolic (congestive) heart failure: Secondary | ICD-10-CM | POA: Diagnosis not present

## 2018-02-16 DIAGNOSIS — I480 Paroxysmal atrial fibrillation: Secondary | ICD-10-CM

## 2018-02-16 DIAGNOSIS — I2699 Other pulmonary embolism without acute cor pulmonale: Secondary | ICD-10-CM | POA: Diagnosis not present

## 2018-02-16 DIAGNOSIS — I272 Pulmonary hypertension, unspecified: Secondary | ICD-10-CM | POA: Diagnosis not present

## 2018-02-16 NOTE — Progress Notes (Signed)
Pt completed SDOH screening during clinic visit- no concerns identified at this time  CSW will continue to follow in clinic and assist as needed  Jorge Ny, [place] Worker [facility] [phone]

## 2018-02-16 NOTE — Patient Instructions (Signed)
Today you have been seen at the Heart failure clinic at [facility]     Follow up with  Dr. Aundra Dubin in one year   Do the following things EVERYDAY: 1) Weigh yourself in the morning before breakfast. Write it down and keep it in a log. 2) Take your medicines as prescribed 3) Eat low salt foods-Limit salt (sodium) to 2000 mg per day.  4) Stay as active as you can everyday 5) Limit all fluids for the day to less than 2 liters

## 2018-02-16 NOTE — Progress Notes (Signed)
. Patient ID: [name], female   DOB: 16-Nov-1929, 84 y.o.   MRN: [id number] PCP: Plotnikov EP: Caryl Comes HF Cardiology: Aundra Dubin  84 y.o. with history of SA nodal dysfunction s/p PPM, HTN, and chronic exertional dyspnea presents for followup of dyspnea.  She has been seeing Dr Chase Caller with pulmonary and sees Dr Caryl Comes for her pacemaker. Over the last few years, she has noted significant exertional dyspnea that has been progressive.  She was sent to see me for evaluation of dyspnea.  There was some concern for asthma, but she says that Breo did not help, and she is no longer using it.  She also has prominent fatigue.   Echo in 2/15 showed normal EF with mild pulmonary hypertension (PASP 39 mmHg).  Echo in 7/17, however, showed PA systolic pressure 30 mmHg with normal RV size and systolic function and LVEF 55-60%.  PFTs in 3/15 were normal except for a mildly decreased DLCO. She had a high resolution CT of the chest in 5/17 that showed no ILD but PA was dilated suggesting possible pulmonary hypertension.  She was referred to this office for evaluation of possible pulmonary hypertension based on these findings. I tried her on empiric Lasix 20 mg daily, but this did not help her dyspnea and she has now stopped it.   I had her do a Agricultural consultant in 9/17.  This was low risk with no ischemia or infarction. We finally ended up doing a right heart cath in 10/17 that showed normal filling pressures and no pulmonary hypertension.  She had CPX in 9/18 that was submaximal and showed no significant cardiac or pulmonary limitation.   Admitted 02/03/18 with increased shortness of breath. CTA of chest showed RLL PE and PE.  Started on eliquis.   Today she returns for HF follow up. Overall feeling fine. Mild dyspnea with exertion. Denies PND/Orthopnea. Appetite ok. No fever or chills. Weight at home has been stable. No bleeding problems. Taking all medications. Lives at [place].   Labs  (3/17): TSH normal. Labs (6/17): K 3.9, creatinine 1.01, RF negative, ANA negative Labs (7/17): K 3.8, creatinine 0.91, BNP 224, HCT 41.1 Labs (9/17): K 4.2, creatinine 0.9, BNP 124 Labs (10/17): K 4.4, creatinine 0.98 Labs (11/17): TSH normal Labs (3/18): hgb 12.8 Labs (5/18): K 4.3, creatinine 0.98 Labs (9/18): K 4.2, creatinine 1.14 Labs (8/19): K 5, creatinine 1.06 Labs (02/05/2018): hGB 11.3   PMH: 1. Gout 2. Depression 3. Hypothyroidism 4. SA node dysfunction with Medtronic PPM. 5. Stress echo (2012) normal. 6. GERD 7. IBS 8. HTN 9. Exertional dyspnea:  - PFTs (3/15) normal except for slightly decreased DLCO - Echo (2/15) with EF 65-70%, mild MR, PASP 39 mmHg.  - High resolution CT chest (5/17) with no evidence for IDL, PA enlarged concerning for pulmonary hypertension. - Possible asthma - Echo (7/17): EF 55-60%, mild MR with mitral valve prolapse, moderate TR, normal RV size and systolic function, PASP 30 mmHg.  - Lexiscan Cardiolite (9/17) with EF 55%, no ischemia/infarction - RHC (10/17) with mean RA 6, PA 28/9, mean PCWP 7, CI 2.37 - CPX (9/18): Peak VO2 11.2, VE/VCO2 slope 0.93, submaximal study => no definite cardiac or pulmonary limitation.  10. Ascending aorta dilation: 4.0 cm on CT chest 5/17.  11. OSA: Using CPAP.  12. Diverticulitis 3/18 13. PE and DVT 01/2018 on eliquis  SH: Lives with son, nonsmoker, no ETOH.  FH: CVAs, father with MI, multiple siblings with coronary disease (has >  10 siblings).   ROS: All systems reviewed and negative except as per HPI.   Current Outpatient Medications  Medication Sig Dispense Refill  . apixaban (ELIQUIS) 5 MG TABS tablet Take 1 tablet (5 mg total) by mouth 2 (two) times daily. 180 tablet 1  . cetirizine (ZYRTEC) 10 MG tablet Take 10 mg by mouth daily as needed for allergies.    [name] escitalopram (LEXAPRO) 10 MG tablet TAKE 1 TABLET (10 MG) BY MOUTH DAILY AT BEDTIME. 90 tablet 3  . feeding supplement, ENSURE ENLIVE,  (ENSURE ENLIVE) LIQD Take 237 mLs by mouth 2 (two) times daily between meals. 237 mL 12  . Iodine, Kelp, (KELP PO) Take 1 tablet by mouth daily.    [name] levothyroxine (SYNTHROID, LEVOTHROID) 100 MCG tablet Take 1 tablet (100 mcg total) by mouth daily. 90 tablet 3  . LORazepam (ATIVAN) 0.5 MG tablet Take 1 tab daily as needed for anxiety 30 tablet 3  . Magnesium Oxide 200 MG TABS Take 1 tablet (200 mg total) by mouth daily. 90 tablet 3  . mirabegron ER (MYRBETRIQ) 50 MG TB24 tablet Take 50 mg by mouth daily.    . psyllium (METAMUCIL SMOOTH TEXTURE) 28 % packet Take 1 packet by mouth daily with breakfast.    . REFRESH TEARS 0.5 % SOLN Place 1 drop into both eyes daily as needed (dry eyes).   0  . spironolactone (ALDACTONE) 25 MG tablet Take 1 tablet (25 mg total) by mouth daily. 90 tablet 3   No current facility-administered medications for this visit.    There were no vitals taken for this visit. General: NAD Neck: No JVD, no thyromegaly or thyroid nodule.  Lungs: Clear to auscultation bilaterally with normal respiratory effort. CV: Nondisplaced PMI.  Heart regular S1/S2, no S3/S4, no murmur.  Trace ankle edema.  No carotid bruit.  Normal pedal pulses.  Abdomen: Soft, nontender, no hepatosplenomegaly, no distention.  Skin: Intact without lesions or rashes.  Neurologic: Alert and oriented x 3.  Psych: Normal affect. Extremities: No clubbing or cyanosis.  HEENT: Normal.    Assessment/Plan: 1. Exertional dyspnea:  This was progressive over several years.  There was some concern for asthma in the past but Breo inhaler did not help.  Echo in 2/15 showed normal EF with mildly elevated PA systolic pressure. However, more recent echo in 7/17 showed normal EF and RV function with no pulmonary hypertension.  PFTs in 3/15 were normal except for low DLCO, which could be indicative of pulmonary vascular disease.  High resolution chest CT in 5/17 did not show interstitial lung disease but did show an enlarged  PA, concerning for possible pulmonary hypertension.  NYHA class II-III symptoms.  Lexiscan Cardiolite in 9/17 showed no ischemia or infarction.  A trial of Lasix 20 mg daily did not help her breathing despite lowering BNP and she is now off Lasix.  I did a right heart cath in 10/17.  This showed normal filling pressures and no pulmonary hypertension.  CPX in 9/18 was submaximal, but no definite cardiac or pulmonary limitation. Diagnosed with PE 01/2018.  She will continue spironolactone.  2. Dilated ascending aorta: 4.0 cm on 5/17 CT.  Will repeat CT chest this year to follow ascending aortic aneurysm.    3. SA nodal dysfunction: She has a Medtronic PPM.  Followed by Dr Caryl Comes. 4. OSA: Continue CPAP.   5. PE  CTA 01/2018 On eliquis. No bleeding issues.  6. DVT  On elqiuis   Follow up in  1 year with Dr Aundra Dubin.   Eufemio Strahm 02/16/2018

## 2018-02-16 NOTE — Addendum Note (Signed)
Encounter addended by: Jorge Ny, LCSW on: 02/16/2018 4:28 PM  Actions taken: Visit Navigator Flowsheet section accepted, Sign clinical note

## 2018-02-20 ENCOUNTER — Encounter: Payer: Self-pay | Admitting: Internal Medicine

## 2018-02-20 ENCOUNTER — Ambulatory Visit (INDEPENDENT_AMBULATORY_CARE_PROVIDER_SITE_OTHER): Payer: Medicare Other | Admitting: Internal Medicine

## 2018-02-20 VITALS — BP 110/70 | HR 77 | Ht 63.0 in | Wt 174.0 lb

## 2018-02-20 DIAGNOSIS — E039 Hypothyroidism, unspecified: Secondary | ICD-10-CM

## 2018-02-20 MED ORDER — SYNTHROID 100 MCG PO TABS: 100.0000 ug | ORAL_TABLET | Freq: Every day | ORAL | 11 refills | Status: DC

## 2018-02-20 NOTE — Patient Instructions (Signed)
Please switch to Synthroid 100 mcg daily.  Please return to see me as needed.

## 2018-02-20 NOTE — Progress Notes (Signed)
Patient ID: [name], female   DOB: Sep 04, 1929, 84 y.o.   MRN: [id number]    HPI  [name] is a 84 y.o.-year-old female, referred by her PCP, Dr. Alain Marion, for hypothyroidism -with the specific question whether her weight gain could be related with her hypothyroidism.  Pt. has been dx with hypothyroidism in 1979-1980 >> on Levothyroxine 100 mcg.  She takes the thyroid hormone: - fasting - with water - separated by 3h from b'fast  - no calcium, iron, PPIs - + multivitamins  - 2x a week, not in am - also takes Kelp  I reviewed pt's thyroid tests: Lab Results  Component Value Date   TSH 0.74 02/01/2018   TSH 1.09 11/29/2017   TSH 4.45 10/13/2016   TSH 1.700 03/22/2016   TSH 1.36 07/21/2015   TSH 0.38 07/30/2014   TSH 0.50 08/23/2013   TSH 1.51 01/01/2013   TSH 2.34 04/10/2012   TSH 1.07 12/15/2011   FREET4 0.70 10/13/2016   FREET4 1.11 03/23/2016   FREET4 1.17 07/30/2014   FREET4 1.05 09/21/2007   FREET4 1.3 09/24/2005   T3FREE 2.3 09/21/2007   Antithyroid antibodies: No results found for: THGAB No components found for: TPOAB  Pt describes: - + weight gain -almost 10 pounds since last year, although she did not change her diet.  She is in a facility and exercises and also is trying to follow a vegan diet.  She has Ensure, but not quite every day. - + fatigue - + cold intolerance - depression - constipation - dry skin - hair loss  Pt denies feeling nodules in neck, hoarseness, dysphagia/odynophagia, SOB with lying down.  She has no FH of thyroid disorders. No FH of thyroid cancer.  No h/o radiation tx to head or neck, but RxTx to breast - in 1995. + kelp.  Pt. also has a history of PE 02/03/2018.  She also has CHF and has a pacemaker.   ROS: Constitutional: + See HPI, + nocturia Eyes: no blurry vision, no xerophthalmia ENT: no sore throat, no nodules palpated in throat, no dysphagia/odynophagia, no hoarseness, + hypoacusis Cardiovascular: no CP/+ SOB/no  palpitations/+ leg swelling Respiratory: no cough/+ SOB Gastrointestinal: no N/V/D/C Musculoskeletal: no muscle/joint aches Skin: no rashes Neurological: no tremors/numbness/tingling/dizziness Psychiatric: + Both: Depression/anxiety  Past Medical History:  Diagnosis Date  . Anxiety   . Bradycardia   . Breast cancer, left breast ([facility]) 1995  . Breast cancer, right breast ([facility]) 2002  . CHF (congestive heart failure) ([facility])   . Depression    Dr Cheryln Manly  . Discoid lupus    skin  . Full dentures   . GERD (gastroesophageal reflux disease)   . Glaucoma   . Gout   . Hx of echocardiogram    a.  Echocardiogram (03/2011): Mild LVH, EF 91-79%, grade 1 diastolic dysfunction, mild MR, mild to moderate TR, PASP 42;  b. Echo (05/2013):  Mod LVH, EF 65-70%, no WMA, Gr 1 DD, mild MR, mod TR, PASP 39  . Hx of exercise stress test    a. ETT-echocardiogram (06/08/11): Sub-optimal exercise. Test stopped early due to dizziness and hypotension. EF 60%.  Non-diagnostic.;   b. Lexiscan Myoview (06/2013):  Diaphragmatic attenuation, no ischemia, EF 61%.  Low Risk  . Hypertension   . Hypothyroidism   . IBS (irritable bowel syndrome)   . IBS (irritable bowel syndrome)   . Incontinence   . OSA (obstructive sleep apnea) 07/20/2016   Mild with AHI 12/hr now on CPAP  .  OSA on CPAP    "quit wearing mask; retested; not enough information; to be tested again 04/08/2016"  . Osteoarthritis   . Osteopenia   . Pacemaker 2009   Brady//Chronotropic incompetence with normal pacemaker function  . Thyroid disease   . Wears hearing aid    both ears   Past Surgical History:  Procedure Laterality Date  . BREAST BIOPSY Left 1995  . BREAST LUMPECTOMY Left 1995   axillary node dissectoin  . CARDIAC CATHETERIZATION N/A 02/13/2016   Procedure: Right Heart Cath;  Surgeon: Larey Dresser, MD;  Location: [facility];  Service: Cardiovascular;  Laterality: N/A;  . CATARACT EXTRACTION W/ INTRAOCULAR LENS  IMPLANT,  BILATERAL Bilateral 06/2009 - 12.2915   left - right  . DORSAL COMPARTMENT RELEASE  2000   left  . HAMMER TOE SURGERY Left 2004  . HEMORRHOID BANDING    . INSERT / REPLACE / REMOVE PACEMAKER  2009  . KNEE ARTHROSCOPY Left 08/29/2012   Procedure: LEFT KNEE ARTHROSCOPY ;  Surgeon: Hessie Dibble, MD;  Location: [facility];  Service: Orthopedics;  Laterality: Left;  [name] MASTECTOMY Bilateral 09/2000   nbx  . TRIGGER FINGER RELEASE Right 07/31/2013   Procedure: EXCISE MASS RIGHT INDEX A-1 PLLLEY RELEASE A-1 RIGHT INDEX;  Surgeon: Cammie Sickle., MD;  Location: [facility];  Service: Orthopedics;  Laterality: Right;  [name] VAGINAL HYSTERECTOMY     Social History   Socioeconomic History  . Marital status: Widowed    Spouse name: Not on file  . Number of children: 3  . Years of education: 77  . Highest education level: Not on file  Occupational History  . Occupation: retired  Scientific laboratory technician  . Financial resource strain: Not hard at all  . Food insecurity:    Worry: Never true    Inability: Never true  . Transportation needs:    Medical: No    Non-medical: No  Tobacco Use  . Smoking status: Never Smoker  . Smokeless tobacco: Never Used  Substance and Sexual Activity  . Alcohol use: No    Alcohol/week: 0.0 standard drinks  . Drug use: No  . Sexual activity: Never  Lifestyle  . Physical activity:    Days per week: Not on file    Minutes per session: Not on file  . Stress: Not on file  Relationships  . Social connections:    Talks on phone: Not on file    Gets together: Not on file    Attends religious service: Not on file    Active member of club or organization: Not on file    Attends meetings of clubs or organizations: Not on file    Relationship status: Not on file  . Intimate partner violence:    Fear of current or ex partner: Not on file    Emotionally abused: Not on file    Physically abused: Not on file    Forced sexual activity: Not on  file  Other Topics Concern  . Not on file  Social History Narrative   She is a vegan now 2010   Current Outpatient Medications on File Prior to Visit  Medication Sig Dispense Refill  . apixaban (ELIQUIS) 5 MG TABS tablet Take 1 tablet (5 mg total) by mouth 2 (two) times daily. 180 tablet 1  . cetirizine (ZYRTEC) 10 MG tablet Take 10 mg by mouth daily as needed for allergies.    [name] escitalopram (LEXAPRO) 10 MG tablet  TAKE 1 TABLET (10 MG) BY MOUTH DAILY AT BEDTIME. 90 tablet 3  . feeding supplement, ENSURE ENLIVE, (ENSURE ENLIVE) LIQD Take 237 mLs by mouth 2 (two) times daily between meals. 237 mL 12  . Iodine, Kelp, (KELP PO) Take 1 tablet by mouth daily.    [name] levothyroxine (SYNTHROID, LEVOTHROID) 100 MCG tablet Take 1 tablet (100 mcg total) by mouth daily. 90 tablet 3  . LORazepam (ATIVAN) 0.5 MG tablet Take 1 tab daily as needed for anxiety 30 tablet 3  . Magnesium Oxide 200 MG TABS Take 1 tablet (200 mg total) by mouth daily. (Patient taking differently: Take 200 mg by mouth daily. Pt is taking 1/2tablet once a day) 90 tablet 3  . mirabegron ER (MYRBETRIQ) 50 MG TB24 tablet Take 50 mg by mouth daily.    . psyllium (METAMUCIL SMOOTH TEXTURE) 28 % packet Take 1 packet by mouth daily with breakfast.    . REFRESH TEARS 0.5 % SOLN Place 1 drop into both eyes daily as needed (dry eyes).   0  . spironolactone (ALDACTONE) 25 MG tablet Take 1 tablet (25 mg total) by mouth daily. 90 tablet 3   No current facility-administered medications on file prior to visit.    Allergies  Allergen Reactions  . Lactase Diarrhea  . Amlodipine Besy-Benazepril Hcl Swelling  . Amlodipine Besylate Swelling  . Clonidine Hydrochloride     REACTION: tired  . Lactose Intolerance (Gi) Other (See Comments)  . Valsartan Other (See Comments)    Tongue swelling   . Verapamil    Family History  Problem Relation Age of Onset  . Ovarian cancer Mother   . Heart disease Father   . Stroke Sister   . Stroke Brother   .  Cancer Brother   . Heart disease Brother     PE: BP 110/70   Pulse 77   Ht 5\' 3"  (1.6 m)   Wt 174 lb (78.9 kg)   SpO2 99%   BMI 30.82 kg/m  Wt Readings from Last 3 Encounters:  02/20/18 174 lb (78.9 kg)  02/16/18 175 lb (79.4 kg)  02/14/18 176 lb (79.8 kg)   Constitutional: overweight, in NAD Eyes: PERRLA, EOMI, no exophthalmos ENT: moist mucous membranes, no thyromegaly, no cervical lymphadenopathy Cardiovascular: RRR, No MRG Respiratory: CTA B Gastrointestinal: abdomen soft, NT, ND, BS+ Musculoskeletal: no deformities, strength intact in all 4 Skin: moist, warm, no rashes Neurological: no tremor with outstretched hands, DTR normal in all 4  ASSESSMENT: 1. Hypothyroidism  PLAN:  1. Patient with long-standing hypothyroidism, on levothyroxine therapy.  This appears very well controlled- her TFTs have been normal at least since 2013.  Last level was reviewed and this was normal 19 days ago. - she appears euthyroid but does have several complaints that could be considered hypothyroid: Weight gain, fatigue, cold intolerance.  I explained that these are nonspecific symptoms that can occur in many other situations.  Specifically, the weight gain could be related to her intake of Ensure and I strongly advised her to stop drinking this.  She does have regular meals so she does not need to supplement.  I advised her that she can drink Ensure if she is about to skip a meal if she is not home. - she does not appear to have a goiter, thyroid nodules, or neck compression symptoms - We discussed about correct intake of levothyroxine, fasting, with water, separated by at least 30 minutes from breakfast, and separated by more than 4 hours from  calcium, iron, multivitamins, acid reflux medications (PPIs).  She is taking it correctly - We discussed at length about possible consequences of hypothyroidism and I advised her that in the case of controlled hypothyroidism, this is not related to weight  gain, fatigue, or other possible hypothyroidism signs and symptoms. - However, we did discuss about the possibility of switching to brand name Synthroid and she agrees to try this.  I sent a prescription to her pharmacy.  She will try this for at least a month and will refill it if she feels better on it and she can afford it.  I advised her that the Synthroid brand name can be more expensive than the levothyroxine. - I will leave the generic levothyroxine on her medication list just in case she needs to return to this - I will see the patient back as needed  Philemon Kingdom, MD PhD [facility]

## 2018-02-23 ENCOUNTER — Telehealth: Payer: Self-pay

## 2018-02-23 NOTE — Telephone Encounter (Signed)
Please advise  Copied from [place] [id number]. Topic: General - Other >> Feb 23, 2018  9:55 AM Pilar Grammes F wrote: Reason for CRM:   Patient would like a call back to discuss her medication interactions. >> Feb 23, 2018 10:15 AM Scherrie Gerlach wrote: Pt wants to know if OK to take her probiotics and the Pepcid along with her blood thinners.

## 2018-02-25 ENCOUNTER — Telehealth: Payer: Medicare Other | Admitting: Physician Assistant

## 2018-02-25 DIAGNOSIS — R197 Diarrhea, unspecified: Secondary | ICD-10-CM

## 2018-02-25 NOTE — Progress Notes (Signed)
We are sorry that you are not feeling well.  Here is how we plan to help!  Based on what you have shared with me it looks like you have Acute Diarrhea.  Most cases of acute diarrhea are due to infections with virus and bacteria and are self-limited conditions lasting less than 14 days.  For your symptoms you may take Imodium 2 mg tablets that are over the counter at your local pharmacy. Take two tablet now and then one after each loose stool up to 6 a day.  Antibiotics are not needed for most people with diarrhea. Stay off of the psyllium and schedule an appointment with your primary provider Monday for further assessment if you have continued symptoms.   HOME CARE  We recommend changing your diet to help with your symptoms for the next few days.  Drink plenty of fluids that contain water salt and sugar. Sports drinks such as Gatorade may help.   You may try broths, soups, bananas, applesauce, soft breads, mashed potatoes or crackers.   You are considered infectious for as long as the diarrhea continues. Hand washing or use of alcohol based hand sanitizers is recommend.  It is best to stay out of work or school until your symptoms stop.   GET HELP RIGHT AWAY  If you have dark yellow colored urine or do not pass urine frequently you should drink more fluids.    If your symptoms worsen   If you feel like you are going to pass out (faint)  You have a new problem  MAKE SURE YOU   Understand these instructions.  Will watch your condition.  Will get help right away if you are not doing well or get worse.  Your e-visit answers were reviewed by a board certified advanced clinical practitioner to complete your personal care plan.  Depending on the condition, your plan could have included both over the counter or prescription medications.  If there is a problem please reply  once you have received a response from your provider.  Your safety is important to [place].  If you have drug  allergies check your prescription carefully.    You can use MyChart to ask questions about today's visit, request a non-urgent call back, or ask for a work or school excuse for 24 hours related to this e-Visit. If it has been greater than 24 hours you will need to follow up with your provider, or enter a new e-Visit to address those concerns.   You will get an e-mail in the next two days asking about your experience.  I hope that your e-visit has been valuable and will speed your recovery. Thank you for using e-visits.

## 2018-02-26 ENCOUNTER — Emergency Department (HOSPITAL_COMMUNITY): Payer: Medicare Other

## 2018-02-26 ENCOUNTER — Encounter (HOSPITAL_COMMUNITY): Payer: Self-pay | Admitting: Emergency Medicine

## 2018-02-26 ENCOUNTER — Emergency Department (HOSPITAL_COMMUNITY)
Admission: EM | Admit: 2018-02-26 | Discharge: 2018-02-27 | Disposition: A | Discharge status: Home / Self Care | Payer: Medicare Other | Attending: Emergency Medicine | Admitting: Emergency Medicine

## 2018-02-26 DIAGNOSIS — W010XXA Fall on same level from slipping, tripping and stumbling without subsequent striking against object, initial encounter: Secondary | ICD-10-CM | POA: Diagnosis not present

## 2018-02-26 DIAGNOSIS — Z950 Presence of cardiac pacemaker: Secondary | ICD-10-CM | POA: Insufficient documentation

## 2018-02-26 DIAGNOSIS — S8001XA Contusion of right knee, initial encounter: Secondary | ICD-10-CM | POA: Insufficient documentation

## 2018-02-26 DIAGNOSIS — Z79899 Other long term (current) drug therapy: Secondary | ICD-10-CM | POA: Insufficient documentation

## 2018-02-26 DIAGNOSIS — I130 Hypertensive heart and chronic kidney disease with heart failure and stage 1 through stage 4 chronic kidney disease, or unspecified chronic kidney disease: Secondary | ICD-10-CM | POA: Diagnosis not present

## 2018-02-26 DIAGNOSIS — Z853 Personal history of malignant neoplasm of breast: Secondary | ICD-10-CM | POA: Diagnosis not present

## 2018-02-26 DIAGNOSIS — N183 Chronic kidney disease, stage 3 (moderate): Secondary | ICD-10-CM | POA: Insufficient documentation

## 2018-02-26 DIAGNOSIS — Y939 Activity, unspecified: Secondary | ICD-10-CM | POA: Insufficient documentation

## 2018-02-26 DIAGNOSIS — I4891 Unspecified atrial fibrillation: Secondary | ICD-10-CM | POA: Diagnosis not present

## 2018-02-26 DIAGNOSIS — W19XXXA Unspecified fall, initial encounter: Secondary | ICD-10-CM

## 2018-02-26 DIAGNOSIS — Z7901 Long term (current) use of anticoagulants: Secondary | ICD-10-CM | POA: Insufficient documentation

## 2018-02-26 DIAGNOSIS — R197 Diarrhea, unspecified: Secondary | ICD-10-CM | POA: Diagnosis not present

## 2018-02-26 DIAGNOSIS — E039 Hypothyroidism, unspecified: Secondary | ICD-10-CM | POA: Insufficient documentation

## 2018-02-26 DIAGNOSIS — R42 Dizziness and giddiness: Secondary | ICD-10-CM | POA: Diagnosis not present

## 2018-02-26 DIAGNOSIS — I10 Essential (primary) hypertension: Secondary | ICD-10-CM | POA: Diagnosis not present

## 2018-02-26 DIAGNOSIS — S8991XA Unspecified injury of right lower leg, initial encounter: Secondary | ICD-10-CM | POA: Diagnosis present

## 2018-02-26 DIAGNOSIS — I2510 Atherosclerotic heart disease of native coronary artery without angina pectoris: Secondary | ICD-10-CM | POA: Diagnosis not present

## 2018-02-26 DIAGNOSIS — S0990XA Unspecified injury of head, initial encounter: Secondary | ICD-10-CM | POA: Diagnosis not present

## 2018-02-26 DIAGNOSIS — Y998 Other external cause status: Secondary | ICD-10-CM | POA: Insufficient documentation

## 2018-02-26 DIAGNOSIS — R0902 Hypoxemia: Secondary | ICD-10-CM | POA: Diagnosis not present

## 2018-02-26 DIAGNOSIS — Y929 Unspecified place or not applicable: Secondary | ICD-10-CM | POA: Diagnosis not present

## 2018-02-26 DIAGNOSIS — Z86718 Personal history of other venous thrombosis and embolism: Secondary | ICD-10-CM | POA: Diagnosis not present

## 2018-02-26 DIAGNOSIS — I509 Heart failure, unspecified: Secondary | ICD-10-CM | POA: Diagnosis not present

## 2018-02-26 DIAGNOSIS — R531 Weakness: Secondary | ICD-10-CM | POA: Diagnosis not present

## 2018-02-26 LAB — BASIC METABOLIC PANEL
Anion gap: 8 (ref 5–15)
BUN: 25 mg/dL — ABNORMAL HIGH (ref 8–23)
CO2: 26 mmol/L (ref 22–32)
Calcium: 9.3 mg/dL (ref 8.9–10.3)
Chloride: 101 mmol/L (ref 98–111)
Creatinine, Ser: 1.15 mg/dL — ABNORMAL HIGH (ref 0.44–1.00)
GFR calc Af Amer: 48 mL/min — ABNORMAL LOW (ref >60)
GFR calc non Af Amer: 42 mL/min — ABNORMAL LOW (ref >60)
Glucose, Bld: 99 mg/dL (ref 70–99)
Potassium: 4.9 mmol/L (ref 3.5–5.1)
Sodium: 135 mmol/L (ref 135–145)

## 2018-02-26 LAB — URINALYSIS, ROUTINE W REFLEX MICROSCOPIC
Bilirubin Urine: NEGATIVE
Glucose, UA: NEGATIVE mg/dL
Ketones, ur: NEGATIVE mg/dL
Leukocytes, UA: NEGATIVE
Nitrite: NEGATIVE
Protein, ur: NEGATIVE mg/dL
Specific Gravity, Urine: 1.01 (ref 1.005–1.030)
pH: 8.5 — ABNORMAL HIGH (ref 5.0–8.0)

## 2018-02-26 LAB — CBC
HCT: 37.9 % (ref 36.0–46.0)
Hemoglobin: 12.4 g/dL (ref 12.0–15.0)
MCH: 33.4 pg (ref 26.0–34.0)
MCHC: 32.7 g/dL (ref 30.0–36.0)
MCV: 102.2 fL — ABNORMAL HIGH (ref 80.0–100.0)
Platelets: 198 10*3/uL (ref 150–400)
RBC: 3.71 MIL/uL — ABNORMAL LOW (ref 3.87–5.11)
RDW: 13.2 % (ref 11.5–15.5)
WBC: 4.9 10*3/uL (ref 4.0–10.5)
nRBC: 0 % (ref 0.0–0.2)

## 2018-02-26 LAB — URINALYSIS, MICROSCOPIC (REFLEX): Bacteria, UA: NONE SEEN

## 2018-02-26 LAB — MAGNESIUM: Magnesium: 2.1 mg/dL (ref 1.7–2.4)

## 2018-02-26 LAB — CBG MONITORING, ED: Glucose-Capillary: 61 mg/dL — ABNORMAL LOW (ref 70–99)

## 2018-02-26 LAB — TROPONIN I
Troponin I: <0.03 ng/mL (ref <0.03)
Troponin I: <0.03 ng/mL (ref <0.03)

## 2018-02-26 NOTE — ED Provider Notes (Signed)
[facility] EMERGENCY DEPARTMENT Provider Note   CSN: [id number] Arrival date & time: 02/26/18  1427     History   Chief Complaint Chief Complaint  Patient presents with  . Fall    HPI Miyu Fenderson is a 84 y.o. female.  HPI   84 year old female presenting after fall.  She states that she felt dizzy and fell to the ground.  She did not lose consciousness.  She denies sensation of vertigo.  She fell onto her knees.  She is complaining of pain primarily in her right one.  She is anticoagulated.  She reports some diarrhea earlier in the week but this resolved.  Denies any headaches or neck pain.  Past Medical History:  Diagnosis Date  . Anxiety   . Bradycardia   . Breast cancer, left breast ([place]) 1995  . Breast cancer, right breast ([place]) 2002  . CHF (congestive heart failure) ([place])   . Depression    Dr Cheryln Manly  . Discoid lupus    skin  . Full dentures   . GERD (gastroesophageal reflux disease)   . Glaucoma   . Gout   . Hx of echocardiogram    a.  Echocardiogram (03/2011): Mild LVH, EF 86-76%, grade 1 diastolic dysfunction, mild MR, mild to moderate TR, PASP 42;  b. Echo (05/2013):  Mod LVH, EF 65-70%, no WMA, Gr 1 DD, mild MR, mod TR, PASP 39  . Hx of exercise stress test    a. ETT-echocardiogram (06/08/11): Sub-optimal exercise. Test stopped early due to dizziness and hypotension. EF 60%.  Non-diagnostic.;   b. Lexiscan Myoview (06/2013):  Diaphragmatic attenuation, no ischemia, EF 61%.  Low Risk  . Hypertension   . Hypothyroidism   . IBS (irritable bowel syndrome)   . IBS (irritable bowel syndrome)   . Incontinence   . OSA (obstructive sleep apnea) 07/20/2016   Mild with AHI 12/hr now on CPAP  . OSA on CPAP    "quit wearing mask; retested; not enough information; to be tested again 04/08/2016"  . Osteoarthritis   . Osteopenia   . Pacemaker 2009   Brady//Chronotropic incompetence with normal pacemaker function  . Thyroid disease   . Wears hearing  aid    both ears    Patient Active Problem List   Diagnosis Date Noted  . PE (pulmonary thromboembolism) ([place]) 02/03/2018  . Macrocytosis 12/21/2017  . Wart 12/21/2017  . OSA (obstructive sleep apnea) 07/20/2016  . Sigmoid diverticulitis 07/01/2016  . Acute diverticulitis   . Rectal bleeding 06/30/2016  . Impacted cerumen of right ear 03/26/2016  . Syncope 03/22/2016  . Benign essential HTN 03/22/2016  . CAD (coronary artery disease) 03/22/2016  . CKD (chronic kidney disease) stage 3, GFR 30-59 ml/min (HCC) 03/22/2016  . Depression 03/22/2016  . Exertional dyspnea 11/04/2015  . Ascending aortic aneurysm ([place]) 11/04/2015  . 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 Arthralgia 10/10/2015  . Pulmonary hypertension ([place]) 09/30/2015  . Mild persistent asthma 09/30/2015  . Irritable larynx 09/04/2015  . Rotator cuff disorder 03/06/2015  . OCD (obsessive compulsive disorder) 04/03/2013  . IBS (irritable bowel syndrome) 07/03/2012  . Dysphagia 04/13/2012  . Denture irritation 12/20/2011  . Irritable bladder 12/20/2011  . Knee pain, bilateral 12/20/2011  . Hearing loss 10/11/2011  . Pacemaker-Medtronic 05/18/2011  . Gout, unspecified 04/30/2010  . Anal fissure 02/03/2010  . Atrial fibrillation ([place]) 08/26/2009  . Sinoatrial node dysfunction ([place]) 09/17/2008  . HYPOTENSION, ORTHOSTATIC 01/29/2008  . Microscopic hematuria 08/21/2007  . Pain in  joint, pelvic region and thigh 08/01/2007  . Vitamin D deficiency 05/23/2007  . Anxiety state 05/23/2007  . Hypothyroidism 03/22/2007  . GERD 03/22/2007  . Osteoarthritis 03/22/2007  . CFS (chronic fatigue syndrome) 11/17/2006  . Hypertensive heart disease 11/17/2006  . ALLERGIC RHINITIS 11/17/2006  . OSTEOPOROSIS 11/17/2006  . BREAST CANCER, HX OF 11/17/2006    Past Surgical History:  Procedure Laterality Date  . BREAST BIOPSY Left 1995  . BREAST LUMPECTOMY Left 1995   axillary node dissectoin  . CARDIAC CATHETERIZATION N/A 02/13/2016    Procedure: Right Heart Cath;  Surgeon: Larey Dresser, MD;  Location: [facility];  Service: Cardiovascular;  Laterality: N/A;  . CATARACT EXTRACTION W/ INTRAOCULAR LENS  IMPLANT, BILATERAL Bilateral 06/2009 - 12.2915   left - right  . DORSAL COMPARTMENT RELEASE  2000   left  . HAMMER TOE SURGERY Left 2004  . HEMORRHOID BANDING    . INSERT / REPLACE / REMOVE PACEMAKER  2009  . KNEE ARTHROSCOPY Left 08/29/2012   Procedure: LEFT KNEE ARTHROSCOPY ;  Surgeon: Hessie Dibble, MD;  Location: [facility];  Service: Orthopedics;  Laterality: Left;  [name] MASTECTOMY Bilateral 09/2000   nbx  . TRIGGER FINGER RELEASE Right 07/31/2013   Procedure: EXCISE MASS RIGHT INDEX A-1 PLLLEY RELEASE A-1 RIGHT INDEX;  Surgeon: Cammie Sickle., MD;  Location: [facility];  Service: Orthopedics;  Laterality: Right;  [name] VAGINAL HYSTERECTOMY       OB History   None      Home Medications    Prior to Admission medications   Medication Sig Start Date End Date Taking? Authorizing Provider  apixaban (ELIQUIS) 5 MG TABS tablet Take 1 tablet (5 mg total) by mouth 2 (two) times daily. 02/14/18   Plotnikov, Evie Lacks, MD  cetirizine (ZYRTEC) 10 MG tablet Take 10 mg by mouth daily as needed for allergies.    [provider]  escitalopram (LEXAPRO) 10 MG tablet TAKE 1 TABLET (10 MG) BY MOUTH DAILY AT BEDTIME. 02/14/18   Plotnikov, Evie Lacks, MD  feeding supplement, ENSURE ENLIVE, (ENSURE ENLIVE) LIQD Take 237 mLs by mouth 2 (two) times daily between meals. 03/23/16   Reyne Dumas, MD  Iodine, Kelp, (KELP PO) Take 1 tablet by mouth daily.    [provider]  levothyroxine (SYNTHROID, LEVOTHROID) 100 MCG tablet Take 1 tablet (100 mcg total) by mouth daily. 02/14/18   Plotnikov, Evie Lacks, MD  LORazepam (ATIVAN) 0.5 MG tablet Take 1 tab daily as needed for anxiety 02/14/18   Plotnikov, Evie Lacks, MD  Magnesium Oxide 200 MG TABS Take 1 tablet (200 mg total) by mouth  daily. Patient taking differently: Take 200 mg by mouth daily. Pt is taking 1/2tablet once a day 09/16/17   Larey Dresser, MD  mirabegron ER (MYRBETRIQ) 50 MG TB24 tablet Take 50 mg by mouth daily.    [provider]  psyllium (METAMUCIL SMOOTH TEXTURE) 28 % packet Take 1 packet by mouth daily with breakfast.    [provider]  REFRESH TEARS 0.5 % SOLN Place 1 drop into both eyes daily as needed (dry eyes).  02/07/17   [provider]  spironolactone (ALDACTONE) 25 MG tablet Take 1 tablet (25 mg total) by mouth daily. 02/14/18   Plotnikov, Evie Lacks, MD  SYNTHROID 100 MCG tablet Take 1 tablet (100 mcg total) by mouth daily before breakfast. 02/20/18   Philemon Kingdom, MD    Family History Family History  Problem Relation Age of Onset  . Ovarian cancer Mother   . Heart disease Father   . Stroke Sister   . Stroke Brother   . Cancer Brother   . Heart disease Brother     Social History Social History   Tobacco Use  . Smoking status: Never Smoker  . Smokeless tobacco: Never Used  Substance Use Topics  . Alcohol use: No    Alcohol/week: 0.0 standard drinks  . Drug use: No     Allergies   Lactase; Amlodipine besy-benazepril hcl; Amlodipine besylate; Clonidine hydrochloride; Lactose intolerance (gi); Valsartan; and Verapamil   Review of Systems Review of Systems  All systems reviewed and negative, other than as noted in HPI.  Physical Exam Updated Vital Signs BP 91/62   Pulse 65   Temp 98.2 F (36.8 C)   SpO2 98%   Physical Exam  Constitutional: She is oriented to person, place, and time. She appears well-developed and well-nourished. No distress.  HENT:  Head: Normocephalic and atraumatic.  Eyes: Pupils are equal, round, and reactive to light. Conjunctivae and EOM are normal. Right eye exhibits no discharge. Left eye exhibits no discharge.  Neck: Neck supple.  Cardiovascular: Normal rate, regular rhythm and normal heart sounds. Exam  reveals no gallop and no friction rub.  No murmur heard. Pulmonary/Chest: Effort normal and breath sounds normal. No respiratory distress.  Abdominal: Soft. She exhibits no distension. There is no tenderness.  Musculoskeletal: She exhibits tenderness. She exhibits no edema.  Very mild tenderness right over the right patella.  No appreciable effusion.  Active range of motion without apparent discomfort.  Is able to ambulate in the emergency room.  No midline spinal tenderness.  Neurological: She is alert and oriented to person, place, and time. No cranial nerve deficit. She exhibits normal muscle tone. Coordination normal.  Skin: Skin is warm and dry.  Psychiatric: She has a normal mood and affect. Her behavior is normal. Thought content normal.  Nursing note and vitals reviewed.    ED Treatments / Results  Labs (all labs ordered are listed, but only abnormal results are displayed) Labs Reviewed  BASIC METABOLIC PANEL - Abnormal; Notable for the following components:      Result Value   BUN 25 (*)    Creatinine, Ser 1.15 (*)    GFR calc non Af Amer 42 (*)    GFR calc Af Amer 48 (*)    All other components within normal limits  CBC - Abnormal; Notable for the following components:   RBC 3.71 (*)    MCV 102.2 (*)    All other components within normal limits  URINALYSIS, ROUTINE W REFLEX MICROSCOPIC - Abnormal; Notable for the following components:   APPearance HAZY (*)    pH 8.5 (*)    Hgb urine dipstick TRACE (*)    All other components within normal limits  CBG MONITORING, ED - Abnormal; Notable for the following components:   Glucose-Capillary 61 (*)    All other components within normal limits  MAGNESIUM  TROPONIN I  URINALYSIS, MICROSCOPIC (REFLEX)  TROPONIN I    EKG EKG Interpretation  Date/Time:  Sunday February 26 2018 14:40:55 EST Ventricular Rate:  61 PR Interval:  214 QRS Duration: 80 QT Interval:  450 QTC Calculation: 453 R Axis:   -69 Text  Interpretation:  Atrial-paced rhythm with prolonged AV conduction New STE I, aVL and TWI aVF and III Confirmed by Virgel Manifold [phone]) on 02/26/2018 3:45:42 PM   Radiology Ct  Head Wo Contrast  Result Date: 02/26/2018 CLINICAL DATA:  Pt c/o a fall earlier today. Pt states she felt dizzy prior to the fall and is unsure if fall was related to syncope or not. Pt is currently on blood thinners. Head trauma, minor, pt on anticoagulation EXAM: CT HEAD WITHOUT CONTRAST TECHNIQUE: Contiguous axial images were obtained from the base of the skull through the vertex without intravenous contrast. COMPARISON:  PET-CT 03/22/2016 FINDINGS: Brain: No acute intracranial hemorrhage. No focal mass lesion. No CT evidence of acute infarction. No midline shift or mass effect. No hydrocephalus. Basilar cisterns are patent. There are periventricular and subcortical white matter hypodensities. Generalized cortical atrophy. Vascular: No hyperdense vessel or unexpected calcification. Skull: Normal. Negative for fracture or focal lesion. Sinuses/Orbits: Paranasal sinuses and mastoid air cells are clear. Orbits are clear. Other: 8 IMPRESSION: No acute intracranial findings. Mild atrophy and white matter microvascular disease. Electronically Signed   By: Suzy Bouchard M.D.   On: 02/26/2018 15:35    Procedures Procedures (including critical care time)  Medications Ordered in ED Medications - No data to display   Initial Impression / Assessment and Plan / ED Course  I have reviewed the triage vital signs and the nursing notes.  Pertinent labs & imaging results that were available during my care of the patient were reviewed by me and considered in my medical decision making (see chart for details).     84 year old female presenting with dizziness and a fall.  Is unclear if she actually had a syncopal event or not.  She complaining of pain primarily in her right knee from this fall.  Have a very low suspicion for fracture.   She is on Eliquis.  Neuro exam is nonfocal.  CT the head does not show any acute abnormality.  Her EKG is abnormal though unchanged from prior.  She denies have any chest pain and nausea, dyspnea diaphoresis or other typical symptoms of ACS during this event or since.  Initial troponin is normal.  Given the change in her EKG, repeat troponin was performed.  This was this remained negative.  Final Clinical Impressions(s) / ED Diagnoses   Final diagnoses:  Fall, initial encounter  Contusion of right knee, initial encounter    ED Discharge Orders    None       Virgel Manifold, MD 03/09/18 [phone]

## 2018-02-26 NOTE — ED Notes (Signed)
Pt states that she is unable to urinate at this moment for urinalysis.

## 2018-02-26 NOTE — ED Triage Notes (Addendum)
Patient arrives via [facility] from home with c/o a fall earlier today. Pt states she felt dizzy prior to the fall and is unsure if fall was related to syncope or not. Pt is currently on blood thinners. reports some diarrhea earlier this week. Vss, cbg 92. A/ox4, resp e/u, nad.

## 2018-02-26 NOTE — ED Notes (Signed)
Pt ambulated to BR with assistance.

## 2018-02-27 ENCOUNTER — Ambulatory Visit (INDEPENDENT_AMBULATORY_CARE_PROVIDER_SITE_OTHER): Payer: Medicare Other | Admitting: Psychology

## 2018-02-27 DIAGNOSIS — F332 Major depressive disorder, recurrent severe without psychotic features: Secondary | ICD-10-CM | POA: Diagnosis not present

## 2018-03-02 ENCOUNTER — Ambulatory Visit (INDEPENDENT_AMBULATORY_CARE_PROVIDER_SITE_OTHER)
Admission: RE | Admit: 2018-03-02 | Discharge: 2018-03-02 | Disposition: A | Discharge status: Home / Self Care | Payer: Medicare Other | Source: Ambulatory Visit | Attending: Family | Admitting: Family

## 2018-03-02 ENCOUNTER — Encounter: Payer: Self-pay | Admitting: Family

## 2018-03-02 ENCOUNTER — Ambulatory Visit (INDEPENDENT_AMBULATORY_CARE_PROVIDER_SITE_OTHER): Payer: Medicare Other | Admitting: Family

## 2018-03-02 VITALS — BP 118/78 | HR 60 | Temp 98.5°F | Ht 63.0 in | Wt 175.2 lb

## 2018-03-02 DIAGNOSIS — M25561 Pain in right knee: Secondary | ICD-10-CM

## 2018-03-02 DIAGNOSIS — H938X3 Other specified disorders of ear, bilateral: Secondary | ICD-10-CM

## 2018-03-02 MED ORDER — NEOMYCIN-POLYMYXIN-HC 3.5-10000-1 OT SOLN: 3.0000 [drp] | Freq: Three times a day (TID) | OTIC | 0 refills | Status: DC

## 2018-03-02 NOTE — Progress Notes (Signed)
[name] is a 84 y.o. female with the following history as recorded in EpicCare:  Patient Active Problem List   Diagnosis Date Noted  . PE (pulmonary thromboembolism) ([place]) 02/03/2018  . Macrocytosis 12/21/2017  . Wart 12/21/2017  . OSA (obstructive sleep apnea) 07/20/2016  . Sigmoid diverticulitis 07/01/2016  . Acute diverticulitis   . Rectal bleeding 06/30/2016  . Impacted cerumen of right ear 03/26/2016  . Syncope 03/22/2016  . Benign essential HTN 03/22/2016  . CAD (coronary artery disease) 03/22/2016  . CKD (chronic kidney disease) stage 3, GFR 30-59 ml/min (HCC) 03/22/2016  . Depression 03/22/2016  . Exertional dyspnea 11/04/2015  . Ascending aortic aneurysm ([place]) 11/04/2015  . 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 Arthralgia 10/10/2015  . Pulmonary hypertension ([place]) 09/30/2015  . Mild persistent asthma 09/30/2015  . Irritable larynx 09/04/2015  . Rotator cuff disorder 03/06/2015  . OCD (obsessive compulsive disorder) 04/03/2013  . IBS (irritable bowel syndrome) 07/03/2012  . Dysphagia 04/13/2012  . Denture irritation 12/20/2011  . Irritable bladder 12/20/2011  . Knee pain, bilateral 12/20/2011  . Hearing loss 10/11/2011  . Pacemaker-Medtronic 05/18/2011  . Gout, unspecified 04/30/2010  . Anal fissure 02/03/2010  . Atrial fibrillation ([place]) 08/26/2009  . Sinoatrial node dysfunction ([place]) 09/17/2008  . HYPOTENSION, ORTHOSTATIC 01/29/2008  . Microscopic hematuria 08/21/2007  . Pain in joint, pelvic region and thigh 08/01/2007  . Vitamin D deficiency 05/23/2007  . Anxiety state 05/23/2007  . Hypothyroidism 03/22/2007  . GERD 03/22/2007  . Osteoarthritis 03/22/2007  . CFS (chronic fatigue syndrome) 11/17/2006  . Hypertensive heart disease 11/17/2006  . ALLERGIC RHINITIS 11/17/2006  . OSTEOPOROSIS 11/17/2006  . BREAST CANCER, HX OF 11/17/2006    Current Outpatient Medications  Medication Sig Dispense Refill  . apixaban (ELIQUIS) 5 MG TABS tablet Take 1 tablet (5 mg  total) by mouth 2 (two) times daily. 180 tablet 1  . cetirizine (ZYRTEC) 10 MG tablet Take 10 mg by mouth daily as needed for allergies.    [name] escitalopram (LEXAPRO) 10 MG tablet TAKE 1 TABLET (10 MG) BY MOUTH DAILY AT BEDTIME. 90 tablet 3  . feeding supplement, ENSURE ENLIVE, (ENSURE ENLIVE) LIQD Take 237 mLs by mouth 2 (two) times daily between meals. 237 mL 12  . LORazepam (ATIVAN) 0.5 MG tablet Take 1 tab daily as needed for anxiety (Patient taking differently: Take 0.5 mg by mouth as needed for anxiety. ) 30 tablet 3  . loteprednol (LOTEMAX) 0.5 % ophthalmic suspension Place 1 drop into both eyes daily.    . Magnesium Oxide 200 MG TABS Take 1 tablet (200 mg total) by mouth daily. (Patient taking differently: Take 100 mg by mouth at bedtime. ) 90 tablet 3  . psyllium (METAMUCIL SMOOTH TEXTURE) 28 % packet Take 1 packet by mouth as needed.     [name] REFRESH TEARS 0.5 % SOLN Place 1 drop into both eyes daily as needed (dry eyes).   0  . spironolactone (ALDACTONE) 25 MG tablet Take 1 tablet (25 mg total) by mouth daily. 90 tablet 3  . SYNTHROID 100 MCG tablet Take 1 tablet (100 mcg total) by mouth daily before breakfast. 30 tablet 11  . tolterodine (DETROL LA) 2 MG 24 hr capsule Take 2 mg by mouth daily.    [name] allopurinol (ZYLOPRIM) 300 MG tablet     . levothyroxine (SYNTHROID, LEVOTHROID) 100 MCG tablet Take 1 tablet (100 mcg total) by mouth daily. (Patient not taking: Reported on 03/02/2018) 90 tablet 3  . neomycin-polymyxin-hydrocortisone (CORTISPORIN) OTIC solution  Place 3 drops into both ears 3 (three) times daily. 10 mL 0   No current facility-administered medications for this visit.     Allergies: Lactase; Amlodipine besy-benazepril hcl; Amlodipine besylate; Clonidine hydrochloride; Lactose intolerance (gi); Valsartan; and Verapamil  Past Medical History:  Diagnosis Date  . Anxiety   . Bradycardia   . Breast cancer, left breast ([place]) 1995  . Breast cancer, right breast ([place]) 2002  . CHF  (congestive heart failure) ([place])   . Depression    Dr Cheryln Manly  . Discoid lupus    skin  . Full dentures   . GERD (gastroesophageal reflux disease)   . Glaucoma   . Gout   . Hx of echocardiogram    a.  Echocardiogram (03/2011): Mild LVH, EF 32-44%, grade 1 diastolic dysfunction, mild MR, mild to moderate TR, PASP 42;  b. Echo (05/2013):  Mod LVH, EF 65-70%, no WMA, Gr 1 DD, mild MR, mod TR, PASP 39  . Hx of exercise stress test    a. ETT-echocardiogram (06/08/11): Sub-optimal exercise. Test stopped early due to dizziness and hypotension. EF 60%.  Non-diagnostic.;   b. Lexiscan Myoview (06/2013):  Diaphragmatic attenuation, no ischemia, EF 61%.  Low Risk  . Hypertension   . Hypothyroidism   . IBS (irritable bowel syndrome)   . IBS (irritable bowel syndrome)   . Incontinence   . OSA (obstructive sleep apnea) 07/20/2016   Mild with AHI 12/hr now on CPAP  . OSA on CPAP    "quit wearing mask; retested; not enough information; to be tested again 04/08/2016"  . Osteoarthritis   . Osteopenia   . Pacemaker 2009   Brady//Chronotropic incompetence with normal pacemaker function  . Thyroid disease   . Wears hearing aid    both ears    Past Surgical History:  Procedure Laterality Date  . BREAST BIOPSY Left 1995  . BREAST LUMPECTOMY Left 1995   axillary node dissectoin  . CARDIAC CATHETERIZATION N/A 02/13/2016   Procedure: Right Heart Cath;  Surgeon: Larey Dresser, MD;  Location: [facility];  Service: Cardiovascular;  Laterality: N/A;  . CATARACT EXTRACTION W/ INTRAOCULAR LENS  IMPLANT, BILATERAL Bilateral 06/2009 - 12.2915   left - right  . DORSAL COMPARTMENT RELEASE  2000   left  . HAMMER TOE SURGERY Left 2004  . HEMORRHOID BANDING    . INSERT / REPLACE / REMOVE PACEMAKER  2009  . KNEE ARTHROSCOPY Left 08/29/2012   Procedure: LEFT KNEE ARTHROSCOPY ;  Surgeon: Hessie Dibble, MD;  Location: [facility];  Service: Orthopedics;  Laterality: Left;  [name] MASTECTOMY  Bilateral 09/2000   nbx  . TRIGGER FINGER RELEASE Right 07/31/2013   Procedure: EXCISE MASS RIGHT INDEX A-1 PLLLEY RELEASE A-1 RIGHT INDEX;  Surgeon: Cammie Sickle., MD;  Location: [facility];  Service: Orthopedics;  Laterality: Right;  [name] VAGINAL HYSTERECTOMY      Family History  Problem Relation Age of Onset  . Ovarian cancer Mother   . Heart disease Father   . Stroke Sister   . Stroke Brother   . Cancer Brother   . Heart disease Brother     Social History   Tobacco Use  . Smoking status: Never Smoker  . Smokeless tobacco: Never Used  Substance Use Topics  . Alcohol use: No    Alcohol/week: 0.0 standard drinks    Subjective:  2 main complaints:  1) Fell over the weekend; went to ER and exam was  normal; notes that right knee is painful from the fall; would like to see if she can get X-ray today; cannot take NSAIDs due to Eliquis. 2) Has been treating ear wax at home; wants to make sure that ears are okay; notes they feel "clogged."  Objective:  Vitals:   03/02/18 1127  BP: 118/78  Pulse: 60  Temp: 98.5 F (36.9 C)  TempSrc: Oral  SpO2: 99%  Weight: 175 lb 3.2 oz (79.5 kg)  Height: 5\' 3"  (1.6 m)    General: Well developed, well nourished, in no acute distress  Skin : Warm and dry.  Head: Normocephalic and atraumatic  Eyes: Sclera and conjunctiva clear; pupils round and reactive to light; extraocular movements intact  Ears: External normal; canals clear; tympanic membranes normal  Oropharynx: Pink, supple. No suspicious lesions  Neck: Supple without thyromegaly, adenopathy  Lungs: Respirations unlabored;  Musculoskeletal: No deformities; no active joint inflammation/ limited exam as patient has on compression stockings today;  Extremities: No edema, cyanosis, clubbing  Vessels: Symmetric bilaterally  Neurologic: Alert and oriented; speech intact; face symmetrical; moves all extremities well; CNII-XII intact without focal deficit   Assessment:   1. Acute pain of right knee   2. Ear fullness, bilateral     Plan:  1. She will get X-ray of knee updated as requested; follow-up to be determined. 2. Reassurance; ear wax is gone; will treat with topical drops to help calm ear canal; follow up worse, no better.   No follow-ups on file.  Orders Placed This Encounter  Procedures  . DG Knee Complete 4 Views Right    Standing Status:   Future    Number of Occurrences:   1    Standing Expiration Date:   05/03/2019    Order Specific Question:   Reason for Exam (SYMPTOM  OR DIAGNOSIS REQUIRED)    Answer:   right knee pain    Order Specific Question:   Preferred imaging location?    Answer:   [facility]    Order Specific Question:   Radiology Contrast Protocol - do NOT remove file path    Answer:   [email or web address]    Requested Prescriptions   Signed Prescriptions Disp Refills  . neomycin-polymyxin-hydrocortisone (CORTISPORIN) OTIC solution 10 mL 0    Sig: Place 3 drops into both ears 3 (three) times daily.

## 2018-03-02 NOTE — Telephone Encounter (Signed)
Ok Thx 

## 2018-03-03 NOTE — Telephone Encounter (Signed)
Pt.notified

## 2018-03-04 DIAGNOSIS — M25561 Pain in right knee: Secondary | ICD-10-CM | POA: Diagnosis not present

## 2018-03-06 ENCOUNTER — Ambulatory Visit: Payer: Self-pay | Admitting: Internal Medicine

## 2018-03-16 DIAGNOSIS — J302 Other seasonal allergic rhinitis: Secondary | ICD-10-CM | POA: Diagnosis not present

## 2018-03-16 DIAGNOSIS — J3089 Other allergic rhinitis: Secondary | ICD-10-CM | POA: Diagnosis not present

## 2018-03-16 DIAGNOSIS — J029 Acute pharyngitis, unspecified: Secondary | ICD-10-CM | POA: Diagnosis not present

## 2018-03-16 DIAGNOSIS — J301 Allergic rhinitis due to pollen: Secondary | ICD-10-CM | POA: Diagnosis not present

## 2018-03-17 DIAGNOSIS — K219 Gastro-esophageal reflux disease without esophagitis: Secondary | ICD-10-CM | POA: Diagnosis not present

## 2018-03-17 DIAGNOSIS — R197 Diarrhea, unspecified: Secondary | ICD-10-CM | POA: Diagnosis not present

## 2018-03-20 ENCOUNTER — Ambulatory Visit (INDEPENDENT_AMBULATORY_CARE_PROVIDER_SITE_OTHER): Payer: Medicare Other | Admitting: Psychiatry

## 2018-03-20 ENCOUNTER — Encounter (HOSPITAL_COMMUNITY): Payer: Self-pay | Admitting: Psychiatry

## 2018-03-20 VITALS — BP 119/71 | HR 82 | Ht 63.0 in | Wt 176.0 lb

## 2018-03-20 DIAGNOSIS — F330 Major depressive disorder, recurrent, mild: Secondary | ICD-10-CM

## 2018-03-20 DIAGNOSIS — F411 Generalized anxiety disorder: Secondary | ICD-10-CM | POA: Diagnosis not present

## 2018-03-20 MED ORDER — LORAZEPAM 0.5 MG PO TABS: ORAL_TABLET | ORAL | 2 refills | Status: DC

## 2018-03-20 MED ORDER — ESCITALOPRAM OXALATE 10 MG PO TABS: ORAL_TABLET | ORAL | 1 refills | Status: DC

## 2018-03-20 NOTE — Progress Notes (Signed)
[facility] MD/PA/NP OP Progress Note  03/20/2018 1:21 PM [name]  MRN:  [id number]  Chief Complaint: I am very anxious about my health.  I have blood clot in my lung.  I still have shortness of breath.  HPI: [name] came for her follow-up appointment.  She admitted in October due to breathing problem and she was told she has a blood clot in the lung.  Even though she is taking blood thinner but she does not feel overall her breathing is improved.  She still have shortness of breath and she feels very anxious and nervous about it.  However she denies any crying spells, irritability, feeling of hopelessness or worthlessness.  Her sleep is good with Ativan.  She has no tremors or shakes.  Overall she feels weak and tired.  She saw pulmonologist but she was not happy because it into her he did not helped her.  I recommended she should consult with her primary care physician Dr. Alain Marion to get a second opinion from her memory and consider oxygen supplement if needed.  Patient had upcoming appointment on December 4 with her primary care physician.  Patient like to continue Lexapro and Ativan.  Recently these medications were filled by primary care physician because she was anxious to running low and out of refills.  Patient denies any agitation, anger, mania or psychosis.  She is happy that she is living in a assisted living facility and staff is very nice and cooperative.  She is also excited that she is having a Thanksgiving at her daughter's house.  Patient denies drinking or using any illegal substances.  He is seeing Dr. Cheryln Manly for therapy.  Visit Diagnosis:    ICD-10-CM   1. Major depressive disorder, recurrent episode, mild (HCC) F33.0   2. GAD (generalized anxiety disorder) F41.1     Past Psychiatric History: Reviewed. History of one psychiatric inpatient treatment because of suicidal thinking.  No history of suicidal attempt, mania or psychosis.  Past Medical History:  Past Medical History:   Diagnosis Date  . Anxiety   . Bradycardia   . Breast cancer, left breast ([place]) 1995  . Breast cancer, right breast ([place]) 2002  . CHF (congestive heart failure) ([place])   . Depression    Dr Cheryln Manly  . Discoid lupus    skin  . Full dentures   . GERD (gastroesophageal reflux disease)   . Glaucoma   . Gout   . Hx of echocardiogram    a.  Echocardiogram (03/2011): Mild LVH, EF 24-58%, grade 1 diastolic dysfunction, mild MR, mild to moderate TR, PASP 42;  b. Echo (05/2013):  Mod LVH, EF 65-70%, no WMA, Gr 1 DD, mild MR, mod TR, PASP 39  . Hx of exercise stress test    a. ETT-echocardiogram (06/08/11): Sub-optimal exercise. Test stopped early due to dizziness and hypotension. EF 60%.  Non-diagnostic.;   b. Lexiscan Myoview (06/2013):  Diaphragmatic attenuation, no ischemia, EF 61%.  Low Risk  . Hypertension   . Hypothyroidism   . IBS (irritable bowel syndrome)   . IBS (irritable bowel syndrome)   . Incontinence   . OSA (obstructive sleep apnea) 07/20/2016   Mild with AHI 12/hr now on CPAP  . OSA on CPAP    "quit wearing mask; retested; not enough information; to be tested again 04/08/2016"  . Osteoarthritis   . Osteopenia   . Pacemaker 2009   Brady//Chronotropic incompetence with normal pacemaker function  . Thyroid disease   . Wears hearing  aid    both ears    Past Surgical History:  Procedure Laterality Date  . BREAST BIOPSY Left 1995  . BREAST LUMPECTOMY Left 1995   axillary node dissectoin  . CARDIAC CATHETERIZATION N/A 02/13/2016   Procedure: Right Heart Cath;  Surgeon: Larey Dresser, MD;  Location: [facility];  Service: Cardiovascular;  Laterality: N/A;  . CATARACT EXTRACTION W/ INTRAOCULAR LENS  IMPLANT, BILATERAL Bilateral 06/2009 - 12.2915   left - right  . DORSAL COMPARTMENT RELEASE  2000   left  . HAMMER TOE SURGERY Left 2004  . HEMORRHOID BANDING    . INSERT / REPLACE / REMOVE PACEMAKER  2009  . KNEE ARTHROSCOPY Left 08/29/2012   Procedure: LEFT KNEE  ARTHROSCOPY ;  Surgeon: Hessie Dibble, MD;  Location: [facility];  Service: Orthopedics;  Laterality: Left;  [name] MASTECTOMY Bilateral 09/2000   nbx  . TRIGGER FINGER RELEASE Right 07/31/2013   Procedure: EXCISE MASS RIGHT INDEX A-1 PLLLEY RELEASE A-1 RIGHT INDEX;  Surgeon: Cammie Sickle., MD;  Location: [facility];  Service: Orthopedics;  Laterality: Right;  [name] VAGINAL HYSTERECTOMY      Family Psychiatric History: Reviewed.  Family History:  Family History  Problem Relation Age of Onset  . Ovarian cancer Mother   . Heart disease Father   . Stroke Sister   . Stroke Brother   . Cancer Brother   . Heart disease Brother     Social History:  Social History   Socioeconomic History  . Marital status: Widowed    Spouse name: Not on file  . Number of children: 3  . Years of education: 42  . Highest education level: Not on file  Occupational History  . Occupation: retired  Scientific laboratory technician  . Financial resource strain: Not hard at all  . Food insecurity:    Worry: Never true    Inability: Never true  . Transportation needs:    Medical: No    Non-medical: No  Tobacco Use  . Smoking status: Never Smoker  . Smokeless tobacco: Never Used  Substance and Sexual Activity  . Alcohol use: No    Alcohol/week: 0.0 standard drinks  . Drug use: No  . Sexual activity: Never  Lifestyle  . Physical activity:    Days per week: Not on file    Minutes per session: Not on file  . Stress: Not on file  Relationships  . Social connections:    Talks on phone: Not on file    Gets together: Not on file    Attends religious service: Not on file    Active member of club or organization: Not on file    Attends meetings of clubs or organizations: Not on file    Relationship status: Not on file  Other Topics Concern  . Not on file  Social History Narrative   She is a vegan now 2010    Allergies:  Allergies  Allergen Reactions  . Lactase Diarrhea  .  Amlodipine Besy-Benazepril Hcl Swelling  . Amlodipine Besylate Swelling  . Clonidine Hydrochloride     REACTION: tired  . Lactose Intolerance (Gi) Other (See Comments)  . Valsartan Other (See Comments)    Tongue swelling   . Verapamil     Metabolic Disorder Labs: Lab Results  Component Value Date   HGBA1C 6.0 04/10/2012   No results found for: PROLACTIN Lab Results  Component Value Date   CHOL 174 05/11/2010   TRIG  78.0 05/11/2010   HDL 52.30 05/11/2010   CHOLHDL 3 05/11/2010   VLDL 15.6 05/11/2010   LDLCALC 106 (H) 05/11/2010   LDLCALC 105 (H) 10/14/2009   Lab Results  Component Value Date   TSH 0.74 02/01/2018   TSH 1.09 11/29/2017    Therapeutic Level Labs: No results found for: LITHIUM No results found for: VALPROATE No components found for:  CBMZ  Current Medications: Current Outpatient Medications  Medication Sig Dispense Refill  . apixaban (ELIQUIS) 5 MG TABS tablet Take 1 tablet (5 mg total) by mouth 2 (two) times daily. 180 tablet 1  . cetirizine (ZYRTEC) 10 MG tablet Take 10 mg by mouth daily as needed for allergies.    [name] escitalopram (LEXAPRO) 10 MG tablet TAKE 1 TABLET (10 MG) BY MOUTH DAILY AT BEDTIME. 90 tablet 3  . feeding supplement, ENSURE ENLIVE, (ENSURE ENLIVE) LIQD Take 237 mLs by mouth 2 (two) times daily between meals. 237 mL 12  . levothyroxine (SYNTHROID, LEVOTHROID) 100 MCG tablet Take 1 tablet (100 mcg total) by mouth daily. 90 tablet 3  . LORazepam (ATIVAN) 0.5 MG tablet Take 1 tab daily as needed for anxiety (Patient taking differently: Take 0.5 mg by mouth as needed for anxiety. ) 30 tablet 3  . loteprednol (LOTEMAX) 0.5 % ophthalmic suspension Place 1 drop into both eyes daily.    . Magnesium Oxide 200 MG TABS Take 1 tablet (200 mg total) by mouth daily. (Patient taking differently: Take 100 mg by mouth at bedtime. ) 90 tablet 3  . neomycin-polymyxin-hydrocortisone (CORTISPORIN) OTIC solution Place 3 drops into both ears 3 (three) times  daily. 10 mL 0  . psyllium (METAMUCIL SMOOTH TEXTURE) 28 % packet Take 1 packet by mouth as needed.     [name] REFRESH TEARS 0.5 % SOLN Place 1 drop into both eyes daily as needed (dry eyes).   0  . spironolactone (ALDACTONE) 25 MG tablet Take 1 tablet (25 mg total) by mouth daily. 90 tablet 3  . SYNTHROID 100 MCG tablet Take 1 tablet (100 mcg total) by mouth daily before breakfast. 30 tablet 11  . tolterodine (DETROL LA) 2 MG 24 hr capsule Take 2 mg by mouth daily.    [name] allopurinol (ZYLOPRIM) 300 MG tablet      No current facility-administered medications for this visit.      Musculoskeletal: Strength & Muscle Tone: within normal limits Gait & Station: use [place] to help balance Patient leans: N/A  Psychiatric Specialty Exam: Review of Systems  Constitutional:       Tired  Respiratory: Positive for shortness of breath.     Blood pressure 119/71, pulse 82, height 5\' 3"  (1.6 m), weight 176 lb (79.8 kg), SpO2 98 %.Body mass index is 31.18 kg/m.  General Appearance: Casual  Eye Contact:  Good  Speech:  Slow  Volume:  Normal  Mood:  Anxious  Affect:  Congruent  Thought Process:  Goal Directed  Orientation:  Full (Time, Place, and Person)  Thought Content: Logical   Suicidal Thoughts:  No  Homicidal Thoughts:  No  Memory:  Immediate;   Good Recent;   Good Remote;   Good  Judgement:  Good  Insight:  Present  Psychomotor Activity:  Normal  Concentration:  Concentration: Fair and Attention Span: Fair  Recall:  Good  Fund of Knowledge: Good  Language: Good  Akathisia:  No  Handed:  Right  AIMS (if indicated): not done  Assets:  Communication Skills Desire for [place]  ADL's:  Intact  Cognition: WNL  Sleep:  Fair   Screenings: PHQ2-9     Office Visit from 02/21/2017 in [facility] Patient Outreach Telephone from 07/14/2016 in [facility] Patient Outreach Telephone from 07/12/2016 in [facility] Patient  Outreach Telephone from 07/08/2016 in [facility] Visit from 07/31/2014 in [facility]  PHQ-2 Total Score  2  2  2  2  2   PHQ-9 Total Score  -  10  10  10   -       Assessment and Plan: Major depressive disorder, recurrent.  Generalized anxiety disorder.  Reassurance given.  Recommended to discuss with Dr. Alain Marion for her chronic shortness of breath and consider getting second opinion and if needed supplemental oxygen.  Patient like to continue Lexapro and Ativan.  She has no side effects and does not ask early refills for Ativan.  Discussed benzodiazepine dependence tolerance and withdrawal.  Continue Lexapro 10 mg daily and Ativan 0.5 mg as needed for anxiety.  Encouraged to continue therapy with Dr. Jeannie Done.  Follow-up in 6 months.  I will forward my note to Dr. Alain Marion.   Kathlee Nations, MD 03/20/2018, 1:21 PM

## 2018-03-27 ENCOUNTER — Encounter: Payer: Self-pay | Admitting: Neurology

## 2018-03-27 ENCOUNTER — Other Ambulatory Visit (INDEPENDENT_AMBULATORY_CARE_PROVIDER_SITE_OTHER): Payer: Medicare Other

## 2018-03-27 ENCOUNTER — Encounter

## 2018-03-27 ENCOUNTER — Ambulatory Visit (INDEPENDENT_AMBULATORY_CARE_PROVIDER_SITE_OTHER): Payer: Medicare Other | Admitting: Neurology

## 2018-03-27 ENCOUNTER — Ambulatory Visit: Payer: Medicare Other | Admitting: Psychology

## 2018-03-27 VITALS — BP 110/70 | HR 73 | Ht 63.0 in | Wt 177.2 lb

## 2018-03-27 DIAGNOSIS — G63 Polyneuropathy in diseases classified elsewhere: Secondary | ICD-10-CM | POA: Diagnosis not present

## 2018-03-27 DIAGNOSIS — G609 Hereditary and idiopathic neuropathy, unspecified: Secondary | ICD-10-CM

## 2018-03-27 LAB — FOLATE: Folate: 12.3 ng/mL (ref >5.9)

## 2018-03-27 NOTE — Progress Notes (Signed)
[facility] Clinic Note - Initial Visit   Date: 03/27/18  [name] MRN: [id number] DOB: 19-Apr-1930   Dear Jodi Mourning, FNP:  Thank you for your kind referral of [name] for consultation of neuropathy. Although her history is well known to you, please allow [place] to reiterate it for the purpose of our medical record. The patient was accompanied to the clinic by self.    History of Present Illness: [name] is a 84 y.o. right-handed African American female with bilateral breast cancer, CHF, SA node dysfunction s/p PPM, depression, GERD, gout, hypothyroidism, hypertension, discoid lupus, and DVT presenting for evaluation of neuropathy.  She is living in [place].     Starting around August 2019, she began having intermittent spells of tingling in the feet and lower legs, which occurs daily and lasts a few hours.  It is worse in the evening and at bedtime.  She does not have any associated numbness.  She has some imbalance and uses a cane since the summer.  No weakness of the legs. She does not have imbalance in the shower when washing her hair or face and has handrails, if needed.   She has history of bilateral breast cancer and had one session of chemotherapy (2002), but does not recall having any numbness/tingling of the legs during this time.  Her diabetes is controlled by diet.  She does not consume alcohol.    She suffered one fall in November associated with dizziness.  She denies frequent tripping or falling.   Out-side paper records, electronic medical record, and images have been reviewed where available and summarized as:  [place] legs 02/04/2018:  Right: There is no evidence of deep vein thrombosis in the lower extremity. No cystic structure found in the popliteal fossa. Left: Findings consistent with age indeterminate deep vein thrombosis involving the left posterior tibial vein. No cystic structure found in the popliteal  fossa.  CT head wo contrast 02/26/2018: No acute intracranial findings. Mild atrophy and white matter microvascular disease.  Lab Results  Component Value Date   [id number] 630 12/21/2017   Lab Results  Component Value Date   TSH 0.74 02/01/2018   Lab Results  Component Value Date   HGBA1C 6.0 04/10/2012     Past Medical History:  Diagnosis Date  . Anxiety   . Bradycardia   . Breast cancer, left breast ([facility]) 1995  . Breast cancer, right breast ([facility]) 2002  . CHF (congestive heart failure) ([facility])   . Depression    Dr Cheryln Manly  . Discoid lupus    skin  . Full dentures   . GERD (gastroesophageal reflux disease)   . Glaucoma   . Gout   . Hx of echocardiogram    a.  Echocardiogram (03/2011): Mild LVH, EF 16-01%, grade 1 diastolic dysfunction, mild MR, mild to moderate TR, PASP 42;  b. Echo (05/2013):  Mod LVH, EF 65-70%, no WMA, Gr 1 DD, mild MR, mod TR, PASP 39  . Hx of exercise stress test    a. ETT-echocardiogram (06/08/11): Sub-optimal exercise. Test stopped early due to dizziness and hypotension. EF 60%.  Non-diagnostic.;   b. Lexiscan Myoview (06/2013):  Diaphragmatic attenuation, no ischemia, EF 61%.  Low Risk  . Hypertension   . Hypothyroidism   . IBS (irritable bowel syndrome)   . IBS (irritable bowel syndrome)   . Incontinence   . OSA (obstructive sleep apnea) 07/20/2016   Mild with AHI 12/hr now on CPAP  .  OSA on CPAP    "quit wearing mask; retested; not enough information; to be tested again 04/08/2016"  . Osteoarthritis   . Osteopenia   . Pacemaker 2009   Brady//Chronotropic incompetence with normal pacemaker function  . Thyroid disease   . Wears hearing aid    both ears    Past Surgical History:  Procedure Laterality Date  . BREAST BIOPSY Left 1995  . BREAST LUMPECTOMY Left 1995   axillary node dissectoin  . CARDIAC CATHETERIZATION N/A 02/13/2016   Procedure: Right Heart Cath;  Surgeon: Larey Dresser, MD;  Location: [facility];  Service:  Cardiovascular;  Laterality: N/A;  . CATARACT EXTRACTION W/ INTRAOCULAR LENS  IMPLANT, BILATERAL Bilateral 06/2009 - 12.2915   left - right  . DORSAL COMPARTMENT RELEASE  2000   left  . HAMMER TOE SURGERY Left 2004  . HEMORRHOID BANDING    . INSERT / REPLACE / REMOVE PACEMAKER  2009  . KNEE ARTHROSCOPY Left 08/29/2012   Procedure: LEFT KNEE ARTHROSCOPY ;  Surgeon: Hessie Dibble, MD;  Location: [facility];  Service: Orthopedics;  Laterality: Left;  [name] MASTECTOMY Bilateral 09/2000   nbx  . TRIGGER FINGER RELEASE Right 07/31/2013   Procedure: EXCISE MASS RIGHT INDEX A-1 PLLLEY RELEASE A-1 RIGHT INDEX;  Surgeon: Cammie Sickle., MD;  Location: [facility];  Service: Orthopedics;  Laterality: Right;  [name] VAGINAL HYSTERECTOMY       Medications:  Outpatient Encounter Medications as of 03/27/2018  Medication Sig Note  . allopurinol (ZYLOPRIM) 300 MG tablet    . apixaban (ELIQUIS) 5 MG TABS tablet Take 1 tablet (5 mg total) by mouth 2 (two) times daily.   . cetirizine (ZYRTEC) 10 MG tablet Take 10 mg by mouth daily as needed for allergies.   [name] escitalopram (LEXAPRO) 10 MG tablet TAKE 1 TABLET (10 MG) BY MOUTH DAILY AT BEDTIME.   . famotidine (PEPCID) 20 MG tablet Take 20 mg by mouth 2 (two) times daily.   . feeding supplement, ENSURE ENLIVE, (ENSURE ENLIVE) LIQD Take 237 mLs by mouth 2 (two) times daily between meals. 12/31/2016: Vegan formula without milk  . levothyroxine (SYNTHROID, LEVOTHROID) 100 MCG tablet Take 1 tablet (100 mcg total) by mouth daily.   [name] LORazepam (ATIVAN) 0.5 MG tablet Take 1 tab daily as needed for anxiety   . loteprednol (LOTEMAX) 0.5 % ophthalmic suspension Place 1 drop into both eyes daily.   . Magnesium Oxide 200 MG TABS Take 1 tablet (200 mg total) by mouth daily. (Patient taking differently: Take 100 mg by mouth at bedtime. )   . neomycin-polymyxin-hydrocortisone (CORTISPORIN) OTIC solution Place 3 drops into both ears 3 (three) times  daily.   . psyllium (METAMUCIL SMOOTH TEXTURE) 28 % packet Take 1 packet by mouth as needed.    [name] REFRESH TEARS 0.5 % SOLN Place 1 drop into both eyes daily as needed (dry eyes).    [name] spironolactone (ALDACTONE) 25 MG tablet Take 1 tablet (25 mg total) by mouth daily.   [name] SYNTHROID 100 MCG tablet Take 1 tablet (100 mcg total) by mouth daily before breakfast.   . tolterodine (DETROL LA) 2 MG 24 hr capsule Take 2 mg by mouth daily.    No facility-administered encounter medications on file as of 03/27/2018.      Allergies:  Allergies  Allergen Reactions  . Lactase Diarrhea  . Amlodipine Besy-Benazepril Hcl Swelling  . Amlodipine Besylate Swelling  . Clonidine Hydrochloride  REACTION: tired  . Lactose Intolerance (Gi) Other (See Comments)  . Valsartan Other (See Comments)    Tongue swelling   . Verapamil     Family History: Family History  Problem Relation Age of Onset  . Ovarian cancer Mother   . Heart disease Father   . Stroke Sister   . Stroke Brother   . Cancer Brother   . Heart disease Brother     Social History: Social History   Tobacco Use  . Smoking status: Never Smoker  . Smokeless tobacco: Never Used  Substance Use Topics  . Alcohol use: No    Alcohol/week: 0.0 standard drinks  . Drug use: No   Social History   Social History Narrative   She is a vegan now 2010.  Lives at [facility].  Retired from Orthoptist.  Education: college.  3 children.     Review of Systems:  CONSTITUTIONAL: No fevers, chills, night sweats, or weight loss.   EYES: No visual changes or eye pain ENT: No hearing changes.  No history of nose bleeds.   RESPIRATORY: No cough, wheezing +shortness of breath.   CARDIOVASCULAR: Negative for chest pain, and palpitations.   GI: Negative for abdominal discomfort, blood in stools or black stools.  No recent change in bowel habits.   GU:  No history of incontinence.   MUSCLOSKELETAL: No history of joint pain or swelling.  No  myalgias.   SKIN: Negative for lesions, rash, and itching.   HEMATOLOGY/ONCOLOGY: Negative for prolonged bleeding, bruising easily, and swollen nodes.  +history of cancer.   ENDOCRINE: Negative for cold or heat intolerance, polydipsia or goiter.   PSYCH:  +depression or anxiety symptoms.   NEURO: As Above.   Vital Signs:  BP 110/70   Pulse 73   Ht 5\' 3"  (1.6 m)   Wt 177 lb 4 oz (80.4 kg)   SpO2 93%   BMI 31.40 kg/m    General Medical Exam:   General:  Well appearing, comfortable.   Eyes/ENT: see cranial nerve examination.   Neck:  No carotid bruits. Respiratory:  Clear to auscultation, good air entry bilaterally, tachypneic.   Cardiac:  Regular rate and rhythm.   Extremities:  No deformities, edema, or skin discoloration.  Skin:  No rashes or lesions.  Neurological Exam: MENTAL STATUS including orientation to time, place, person, recent and remote memory, attention span and concentration, language, and fund of knowledge is normal.  Speech is not dysarthric.  CRANIAL NERVES: II:  No visual field defects.  Unremarkable fundi.   III-IV-VI: Pupils equal round and reactive to light.  Normal conjugate, extra-ocular eye movements in all directions of gaze.  No nystagmus.  No ptosis.   V:  Normal facial sensation   VII:  Normal facial symmetry and movements.   VIII:  Normal hearing and vestibular function.   IX-X:  Normal palatal movement.   XI:  Normal shoulder shrug and head rotation.   XII:  Normal tongue strength and range of motion, no deviation or fasciculation.  MOTOR:  No atrophy, fasciculations or abnormal movements.  No pronator drift.  Tone is normal.    Right Upper Extremity:    Left Upper Extremity:    Deltoid  5/5   Deltoid  5/5   Biceps  5/5   Biceps  5/5   Triceps  5/5   Triceps  5/5   Wrist extensors  5/5   Wrist extensors  5/5   Wrist flexors  5/5  Wrist flexors  5/5   Finger extensors  5/5   Finger extensors  5/5   Finger flexors  5/5   Finger flexors  5/5    Dorsal interossei  5/5   Dorsal interossei  5/5   Abductor pollicis  5/5   Abductor pollicis  5/5   Tone (Ashworth scale)  0  Tone (Ashworth scale)  0   Right Lower Extremity:    Left Lower Extremity:    Hip flexors  5/5   Hip flexors  5/5   Hip extensors  5/5   Hip extensors  5/5   Knee flexors  5/5   Knee flexors  5/5   Knee extensors  5/5   Knee extensors  5/5   Dorsiflexors  5/5   Dorsiflexors  5/5   Plantarflexors  5/5   Plantarflexors  5/5   Toe extensors  5/5   Toe extensors  5/5   Toe flexors  5/5   Toe flexors  5/5   Tone (Ashworth scale)  0  Tone (Ashworth scale)  0   MSRs:  Right                                                                 Left brachioradialis 2+  brachioradialis 2+  biceps 2+  biceps 2+  triceps 2+  triceps 2+  patellar 2+  patellar 2+  ankle jerk 0  ankle jerk 0  plantar response down  plantar response down   SENSORY:  Reduced pin prick and temperature distal to ankles bilaterally.  Absent vibration at the ankles. Sensation in the arms is normal. Rhomberg sign is positive.    COORDINATION/GAIT: Normal finger-to- nose-finger.  Intact rapid alternating movements bilaterally.  Gait is slow, wide-based, and assisted with a cane.  She does have exertional dyspnea even with walking a few steps in the office.   IMPRESSION: Peripheral neuropathy involving the feet, contributed by hypothyroidism and discoid lupus.  Her diabetes is very well-controlled by diet.   I had extensive discussion with the patient regarding the pathogenesis, etiology, management, and natural course of neuropathy. Neuropathy tends to be slowly progressive and can affect sensation, strength, as well as balance.  To be complete, I will check folate, fopper, vitamin B1, and SPEP with IFE for treatable causes.    Management is guided towards symptomatic treatment.  She declined medication for her paresthesias as they are not bothersome enough at this time.  Alternatively, she can apply OTC  lidocaine ointment to feet as needed.  I stressed the importance of daily foot inspection and fall precautions, especially being on anticoagulation therapy.  She was encouraged to always use a cane and also consider a Rollator, as this would help with balance and allow her to sit and rest for her exertional dyspnea.    Return to clinic in 6 months   Thank you for allowing me to participate in patient's care.  If I can answer any additional questions, I would be pleased to do so.    Sincerely,    Donika K. Posey Pronto, DO

## 2018-03-27 NOTE — Patient Instructions (Addendum)
Check labs  If your feet tingling gets worse, you can use over the counter lidocaine cream (Salonpas, Aspercream)  Always use your cane.  If your balance gets worse, please start to use a rollator  Daily foot examination  Return to clinic in 6 months

## 2018-03-29 ENCOUNTER — Ambulatory Visit (INDEPENDENT_AMBULATORY_CARE_PROVIDER_SITE_OTHER): Payer: Medicare Other | Admitting: Internal Medicine

## 2018-03-29 ENCOUNTER — Telehealth: Payer: Self-pay | Admitting: Internal Medicine

## 2018-03-29 ENCOUNTER — Other Ambulatory Visit (INDEPENDENT_AMBULATORY_CARE_PROVIDER_SITE_OTHER): Payer: Medicare Other

## 2018-03-29 ENCOUNTER — Encounter: Payer: Self-pay | Admitting: Internal Medicine

## 2018-03-29 DIAGNOSIS — N183 Chronic kidney disease, stage 3 unspecified: Secondary | ICD-10-CM

## 2018-03-29 DIAGNOSIS — I480 Paroxysmal atrial fibrillation: Secondary | ICD-10-CM | POA: Diagnosis not present

## 2018-03-29 DIAGNOSIS — I2699 Other pulmonary embolism without acute cor pulmonale: Secondary | ICD-10-CM | POA: Diagnosis not present

## 2018-03-29 DIAGNOSIS — I2510 Atherosclerotic heart disease of native coronary artery without angina pectoris: Secondary | ICD-10-CM

## 2018-03-29 DIAGNOSIS — I10 Essential (primary) hypertension: Secondary | ICD-10-CM | POA: Diagnosis not present

## 2018-03-29 DIAGNOSIS — R0609 Other forms of dyspnea: Secondary | ICD-10-CM

## 2018-03-29 DIAGNOSIS — F411 Generalized anxiety disorder: Secondary | ICD-10-CM

## 2018-03-29 DIAGNOSIS — I824Z2 Acute embolism and thrombosis of unspecified deep veins of left distal lower extremity: Secondary | ICD-10-CM | POA: Diagnosis not present

## 2018-03-29 DIAGNOSIS — I82409 Acute embolism and thrombosis of unspecified deep veins of unspecified lower extremity: Secondary | ICD-10-CM | POA: Insufficient documentation

## 2018-03-29 DIAGNOSIS — R0600 Dyspnea, unspecified: Secondary | ICD-10-CM

## 2018-03-29 LAB — CBC WITH DIFFERENTIAL/PLATELET
Basophils Absolute: 0 10*3/uL (ref 0.0–0.1)
Basophils Relative: 0.5 % (ref 0.0–3.0)
Eosinophils Absolute: 0.1 10*3/uL (ref 0.0–0.7)
Eosinophils Relative: 3.7 % (ref 0.0–5.0)
HCT: 37.3 % (ref 36.0–46.0)
Hemoglobin: 12.5 g/dL (ref 12.0–15.0)
LYMPHS ABS: 0.8 10*3/uL (ref 0.7–4.0)
Lymphocytes Relative: 26.9 % (ref 12.0–46.0)
MCHC: 33.5 g/dL (ref 30.0–36.0)
MCV: 100.3 fl — ABNORMAL HIGH (ref 78.0–100.0)
Monocytes Absolute: 0.3 10*3/uL (ref 0.1–1.0)
Monocytes Relative: 10.2 % (ref 3.0–12.0)
Neutro Abs: 1.8 10*3/uL (ref 1.4–7.7)
Neutrophils Relative %: 58.7 % (ref 43.0–77.0)
Platelets: 190 10*3/uL (ref 150.0–400.0)
RBC: 3.72 Mil/uL — AB (ref 3.87–5.11)
RDW: 14.1 % (ref 11.5–15.5)
WBC: 3 10*3/uL — ABNORMAL LOW (ref 4.0–10.5)

## 2018-03-29 LAB — BASIC METABOLIC PANEL
BUN: 31 mg/dL — ABNORMAL HIGH (ref 6–23)
CO2: 29 mEq/L (ref 19–32)
Calcium: 9.3 mg/dL (ref 8.4–10.5)
Chloride: 102 mEq/L (ref 96–112)
Creatinine, Ser: 1.18 mg/dL (ref 0.40–1.20)
GFR: 55.83 mL/min — ABNORMAL LOW (ref >60.00)
GLUCOSE: 96 mg/dL (ref 70–99)
Potassium: 4.5 mEq/L (ref 3.5–5.1)
Sodium: 138 mEq/L (ref 135–145)

## 2018-03-29 LAB — D-DIMER, QUANTITATIVE: D-Dimer, Quant: 0.25 mcg/mL FEU (ref <0.50)

## 2018-03-29 NOTE — Assessment & Plan Note (Signed)
Spironolactone

## 2018-03-29 NOTE — Assessment & Plan Note (Signed)
On Eliquis LLE DVT on [place] D dimer

## 2018-03-29 NOTE — Assessment & Plan Note (Signed)
Eliquis 

## 2018-03-29 NOTE — Assessment & Plan Note (Addendum)
10/19 CT IMPRESSION:  1. Right lower lobar pulmonary embolus. 2. No right heart strain. 3. Mild cardiomegaly without failure. 4. Coronary artery disease. 5.  Aortic Atherosclerosis (ICD10-I70.0). 6. 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 7. Cholelithiasis without cholecystitis. 8. Small hiatal hernia.   Electronically Signed   By: San Morelle M.D.   On: 02/03/2018 16:38  On Eliquis LLE DVT on [place]

## 2018-03-29 NOTE — Assessment & Plan Note (Addendum)
Eliquis  No angina

## 2018-03-29 NOTE — Assessment & Plan Note (Signed)
Discussed.

## 2018-03-29 NOTE — Progress Notes (Signed)
Subjective:  Patient ID: [name], female    DOB: 25-Feb-1930  Age: 84 y.o. MRN: [id number]  CC: No chief complaint on file.   HPI [name] presents for chronic SOB, anxiety, IBS f/u C/o chronic SOB?DOE - it is no better since PE was treated. The pt has multiple worries and multiple questions, however, she is very aware of her medical issues, of what was done about them and when  Outpatient Medications Prior to Visit  Medication Sig Dispense Refill  . apixaban (ELIQUIS) 5 MG TABS tablet Take 1 tablet (5 mg total) by mouth 2 (two) times daily. 180 tablet 1  . cetirizine (ZYRTEC) 10 MG tablet Take 10 mg by mouth daily as needed for allergies.    [name] escitalopram (LEXAPRO) 10 MG tablet TAKE 1 TABLET (10 MG) BY MOUTH DAILY AT BEDTIME. 90 tablet 1  . famotidine (PEPCID) 20 MG tablet Take 20 mg by mouth 2 (two) times daily.  2  . feeding supplement, ENSURE ENLIVE, (ENSURE ENLIVE) LIQD Take 237 mLs by mouth 2 (two) times daily between meals. 237 mL 12  . LORazepam (ATIVAN) 0.5 MG tablet Take 1 tab daily as needed for anxiety 30 tablet 2  . loteprednol (LOTEMAX) 0.5 % ophthalmic suspension Place 1 drop into both eyes daily.    . Magnesium Oxide 200 MG TABS Take 1 tablet (200 mg total) by mouth daily. (Patient taking differently: Take 100 mg by mouth at bedtime. ) 90 tablet 3  . psyllium (METAMUCIL SMOOTH TEXTURE) 28 % packet Take 1 packet by mouth as needed.     [name] REFRESH TEARS 0.5 % SOLN Place 1 drop into both eyes daily as needed (dry eyes).   0  . spironolactone (ALDACTONE) 25 MG tablet Take 1 tablet (25 mg total) by mouth daily. 90 tablet 3  . SYNTHROID 100 MCG tablet Take 1 tablet (100 mcg total) by mouth daily before breakfast. 30 tablet 11  . tolterodine (DETROL LA) 2 MG 24 hr capsule Take 2 mg by mouth daily.    [name] allopurinol (ZYLOPRIM) 300 MG tablet     . levothyroxine (SYNTHROID, LEVOTHROID) 100 MCG tablet Take 1 tablet (100 mcg total) by mouth daily. 90 tablet 3  .  neomycin-polymyxin-hydrocortisone (CORTISPORIN) OTIC solution Place 3 drops into both ears 3 (three) times daily. 10 mL 0   No facility-administered medications prior to visit.     ROS: Review of Systems  Constitutional: Positive for fatigue. Negative for activity change, appetite change, chills and unexpected weight change.  HENT: Negative for congestion, mouth sores and sinus pressure.   Eyes: Negative for visual disturbance.  Respiratory: Positive for shortness of breath. Negative for cough and chest tightness.   Gastrointestinal: Positive for abdominal distention. Negative for abdominal pain and nausea.  Genitourinary: Negative for difficulty urinating, frequency and vaginal pain.  Musculoskeletal: Negative for back pain and gait problem.  Skin: Negative for pallor and rash.  Neurological: Negative for dizziness, tremors, weakness, numbness and headaches.  Psychiatric/Behavioral: Negative for confusion and sleep disturbance.    Objective:  BP 132/86 (BP Location: Right Arm, Patient Position: Sitting, Cuff Size: Normal)   Pulse 72   Temp 97.9 F (36.6 C) (Oral)   Ht 5\' 3"  (1.6 m)   Wt 170 lb (77.1 kg)   SpO2 99%   BMI 30.11 kg/m   BP Readings from Last 3 Encounters:  03/29/18 132/86  03/27/18 110/70  03/02/18 118/78    Wt Readings from Last 3 Encounters:  03/29/18  170 lb (77.1 kg)  03/27/18 177 lb 4 oz (80.4 kg)  03/02/18 175 lb 3.2 oz (79.5 kg)    Physical Exam  Constitutional: She appears well-developed. No distress.  HENT:  Head: Normocephalic.  Right Ear: External ear normal.  Left Ear: External ear normal.  Nose: Nose normal.  Mouth/Throat: Oropharynx is clear and moist.  Eyes: Pupils are equal, round, and reactive to light. Conjunctivae are normal. Right eye exhibits no discharge. Left eye exhibits no discharge.  Neck: Normal range of motion. Neck supple. No JVD present. No tracheal deviation present. No thyromegaly present.  Cardiovascular: Normal rate,  regular rhythm and normal heart sounds.  Pulmonary/Chest: No stridor. No respiratory distress. She has no wheezes.  Abdominal: Soft. Bowel sounds are normal. She exhibits no distension and no mass. There is no tenderness. There is no rebound and no guarding.  Musculoskeletal: She exhibits no edema or tenderness.  Lymphadenopathy:    She has no cervical adenopathy.  Neurological: She displays normal reflexes. No cranial nerve deficit. She exhibits normal muscle tone. Coordination normal.  Skin: No rash noted. No erythema.  Psychiatric: She has a normal mood and affect. Her behavior is normal. Judgment and thought content normal.  cane obese  Lab Results  Component Value Date   WBC 4.9 02/26/2018   HGB 12.4 02/26/2018   HCT 37.9 02/26/2018   PLT 198 02/26/2018   GLUCOSE 99 02/26/2018   CHOL 174 05/11/2010   TRIG 78.0 05/11/2010   HDL 52.30 05/11/2010   LDLDIRECT 130.8 09/30/2006   LDLCALC 106 (H) 05/11/2010   ALT 9 11/29/2017   AST 15 11/29/2017   NA 135 02/26/2018   K 4.9 02/26/2018   CL 101 02/26/2018   CREATININE 1.15 (H) 02/26/2018   BUN 25 (H) 02/26/2018   CO2 26 02/26/2018   TSH 0.74 02/01/2018   INR 1.08 06/30/2016   HGBA1C 6.0 04/10/2012   11/19 CT chest IMPRESSION: 1. Right lower lobar pulmonary embolus. 2. No right heart strain. 3. Mild cardiomegaly without failure. 4. Coronary artery disease. 5.  Aortic Atherosclerosis (ICD10-I70.0). 6. 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 7. Cholelithiasis without cholecystitis. 8. Small hiatal hernia.   Electronically Signed   By: San Morelle M.D.   On: 02/03/2018 16:38 Dg Knee Complete 4 Views Right  Result Date: 03/02/2018 CLINICAL DATA:  Generalized RIGHT knee pain since falling 7 days ago EXAM: RIGHT KNEE - COMPLETE 4+ VIEW COMPARISON:  RIGHT femoral radiographs 07/28/2007 FINDINGS: Osseous demineralization. Diffuse joint space narrowing and marginal spur formation  greatest at patellofemoral joint. No acute fracture, dislocation, or bone destruction. No knee joint effusion. Patellar spur at quadriceps tendon insertion. IMPRESSION: Tricompartmental degenerative changes RIGHT knee without acute bony abnormalities. Electronically Signed   By: Lavonia Dana M.D.   On: 03/02/2018 16:47    Assessment & Plan:   There are no diagnoses linked to this encounter.   No orders of the defined types were placed in this encounter.    Follow-up: No follow-ups on file.  Walker Kehr, MD

## 2018-03-29 NOTE — Assessment & Plan Note (Signed)
Monitor BMET. 

## 2018-03-29 NOTE — Assessment & Plan Note (Signed)
Dr Lynford Citizen appt 11/19

## 2018-03-30 LAB — PROTEIN ELECTROPHORESIS, SERUM
ALBUMIN ELP: 3.8 g/dL (ref 3.8–4.8)
Alpha 1: 0.3 g/dL (ref 0.2–0.3)
Alpha 2: 0.7 g/dL (ref 0.5–0.9)
BETA 2: 0.4 g/dL (ref 0.2–0.5)
BETA GLOBULIN: 0.4 g/dL (ref 0.4–0.6)
Gamma Globulin: 1.1 g/dL (ref 0.8–1.7)
TOTAL PROTEIN: 6.6 g/dL (ref 6.1–8.1)

## 2018-03-30 LAB — COPPER, SERUM: Copper: 139 ug/dL (ref 70–175)

## 2018-03-30 LAB — IMMUNOFIXATION ELECTROPHORESIS
IGG (IMMUNOGLOBIN G), SERUM: 1344 mg/dL (ref 600–1540)
IMMUNOFIX ELECTR INT: NOT DETECTED
IMMUNOGLOBULIN A: 294 mg/dL (ref 20–320)
IgM, Serum: 38 mg/dL — ABNORMAL LOW (ref 50–300)

## 2018-03-30 LAB — VITAMIN B1: Vitamin B1 (Thiamine): 14 nmol/L (ref 8–30)

## 2018-03-30 NOTE — Telephone Encounter (Signed)
Due to this being open in error, will close encounter.

## 2018-03-31 ENCOUNTER — Telehealth: Payer: Self-pay | Admitting: Internal Medicine

## 2018-03-31 ENCOUNTER — Telehealth: Payer: Self-pay | Admitting: *Deleted

## 2018-03-31 NOTE — Telephone Encounter (Signed)
Copied from [place] [id number]. Topic: Quick Communication - Rx Refill/Question >> Mar 31, 2018 10:22 AM Margot Ables wrote: Medication: apixaban (ELIQUIS) 5 MG TABS tablet  Pt said that her lab results per Dr. Alain Marion showed that she does not have an active blood clot. She is asking if she needs to continue Eliquis. Please advise.

## 2018-03-31 NOTE — Telephone Encounter (Signed)
Left message giving patient results.  

## 2018-03-31 NOTE — Telephone Encounter (Signed)
-----   Message from Alda Berthold, DO sent at 03/31/2018  9:43 AM EST ----- Please notify patient lab are within normal limits.  Pls call patient as I'm not sure how active she is on Mychart. Thank you.

## 2018-03-31 NOTE — Telephone Encounter (Signed)
Yes, she does need to continue Eliquis for at least a year, unless instructed otherwise by Pulmonology. Thx

## 2018-03-31 NOTE — Telephone Encounter (Signed)
LM notifying pt

## 2018-03-31 NOTE — Telephone Encounter (Signed)
Please advise 

## 2018-04-04 ENCOUNTER — Ambulatory Visit: Payer: Medicare Other | Admitting: Psychology

## 2018-04-06 ENCOUNTER — Telehealth: Payer: Self-pay | Admitting: Cardiology

## 2018-04-06 ENCOUNTER — Ambulatory Visit (INDEPENDENT_AMBULATORY_CARE_PROVIDER_SITE_OTHER): Payer: Medicare Other

## 2018-04-06 DIAGNOSIS — I495 Sick sinus syndrome: Secondary | ICD-10-CM

## 2018-04-06 DIAGNOSIS — I5032 Chronic diastolic (congestive) heart failure: Secondary | ICD-10-CM | POA: Diagnosis not present

## 2018-04-06 NOTE — Telephone Encounter (Signed)
LMOVM reminding pt to send remote transmission.   

## 2018-04-07 ENCOUNTER — Encounter: Payer: Self-pay | Admitting: Cardiology

## 2018-04-07 NOTE — Progress Notes (Signed)
Remote pacemaker transmission.   

## 2018-04-11 ENCOUNTER — Encounter: Payer: Self-pay | Admitting: Primary Care

## 2018-04-11 ENCOUNTER — Ambulatory Visit (INDEPENDENT_AMBULATORY_CARE_PROVIDER_SITE_OTHER): Payer: Medicare Other | Admitting: Primary Care

## 2018-04-11 VITALS — BP 110/80 | HR 70 | Temp 98.1°F | Ht 63.0 in | Wt 170.0 lb

## 2018-04-11 DIAGNOSIS — I272 Pulmonary hypertension, unspecified: Secondary | ICD-10-CM | POA: Diagnosis not present

## 2018-04-11 DIAGNOSIS — R0600 Dyspnea, unspecified: Secondary | ICD-10-CM

## 2018-04-11 DIAGNOSIS — I2699 Other pulmonary embolism without acute cor pulmonale: Secondary | ICD-10-CM

## 2018-04-11 DIAGNOSIS — R0609 Other forms of dyspnea: Secondary | ICD-10-CM

## 2018-04-11 DIAGNOSIS — R0602 Shortness of breath: Secondary | ICD-10-CM

## 2018-04-11 NOTE — Assessment & Plan Note (Addendum)
-   CTA 02/03/18 showed new right lower lobe pulmonary embolism, no right heart strain - Continues Eliquis 5mg  twice daily - PE appears unprovoked, she will likely need treatment with blood thinner for 6-12 months. Refer to hematology for guidance of duration of treatment and also d/t unprovoked DVT and PE. Will need coag testing once off blood thinner

## 2018-04-11 NOTE — Assessment & Plan Note (Addendum)
-   Echocardiogram on 01/17/18 - EF 21-82%; grade 1 diastolic dysfunction with no evidence of pulmonary hypertension

## 2018-04-11 NOTE — Progress Notes (Signed)
@Patient  ID: [name], female    DOB: 1929-05-31, 84 y.o.   MRN: [id number]  Chief Complaint  Patient presents with  . Acute Visit    blood clot in lung, SOB with exertion,     Referring provider: Plotnikov, Evie Lacks, MD  HPI: 84 year old female, never smoked. PMH mild persistent asthma, OSA, irritable larynx, pulmonary hypertension, afib, hypertension, DVT, PE, syncope, GERD, dysphagia, breast cancer, discoid lupus, CKD stage3, depression. Patient of Dr. Chase Caller, last seen 01/25/18 for shortness of breath.   Diagnosed with acute pulmonary embolism and left lower extremity DVT on October 11th 2019. Hx breast cancer. Started on Eliquis. Denies surgery, travel, immobilization or trauma.   04/11/2018 Presents today for follow-up for new PE diagnosed in mid October. Blood clots appear unprovoked. Continues on Eliquis 5mg  twice daily. Still having some shortness of breath, not any better since PE. No cough or wheezing. Some anxiety with new diagnosis. Wants to exercise at her independent living facility. Anxious about how much she can or cannot do d/t blood clot. She has been taking a balance class, states that it helped a lot. Learned how to protect her self if she should fall. Reports that she fell in november and was taken to ED as precaution. No recent falls or spells of dizziness. Following with cardiology as well. She had an Echocardiogram in September 24th 2019 that showed an improved EF of 40-08%; grade 1 diastolic dysfunction with no evidence of pulmonary hypertension.   Allergies  Allergen Reactions  . Lactase Diarrhea  . Amlodipine Besy-Benazepril Hcl Swelling  . Amlodipine Besylate Swelling  . Clonidine Hydrochloride     REACTION: tired  . Lactose Intolerance (Gi) Other (See Comments)  . Valsartan Other (See Comments)    Tongue swelling   . Verapamil     Immunization History  Administered Date(s) Administered  . PPD Test 03/21/2017    Past Medical History:    Diagnosis Date  . Anxiety   . Bradycardia   . Breast cancer, left breast ([place]) 1995  . Breast cancer, right breast ([place]) 2002  . CHF (congestive heart failure) ([place])   . Depression    Dr Cheryln Manly  . Discoid lupus    skin  . Full dentures   . GERD (gastroesophageal reflux disease)   . Glaucoma   . Gout   . Hx of echocardiogram    a.  Echocardiogram (03/2011): Mild LVH, EF 67-61%, grade 1 diastolic dysfunction, mild MR, mild to moderate TR, PASP 42;  b. Echo (05/2013):  Mod LVH, EF 65-70%, no WMA, Gr 1 DD, mild MR, mod TR, PASP 39  . Hx of exercise stress test    a. ETT-echocardiogram (06/08/11): Sub-optimal exercise. Test stopped early due to dizziness and hypotension. EF 60%.  Non-diagnostic.;   b. Lexiscan Myoview (06/2013):  Diaphragmatic attenuation, no ischemia, EF 61%.  Low Risk  . Hypertension   . Hypothyroidism   . IBS (irritable bowel syndrome)   . IBS (irritable bowel syndrome)   . Incontinence   . OSA (obstructive sleep apnea) 07/20/2016   Mild with AHI 12/hr now on CPAP  . OSA on CPAP    "quit wearing mask; retested; not enough information; to be tested again 04/08/2016"  . Osteoarthritis   . Osteopenia   . Pacemaker 2009   Brady//Chronotropic incompetence with normal pacemaker function  . Thyroid disease   . Wears hearing aid    both ears    Tobacco History: Social History  Tobacco Use  Smoking Status Never Smoker  Smokeless Tobacco Never Used   Counseling given: Not Answered   Outpatient Medications Prior to Visit  Medication Sig Dispense Refill  . apixaban (ELIQUIS) 5 MG TABS tablet Take 1 tablet (5 mg total) by mouth 2 (two) times daily. 180 tablet 1  . cetirizine (ZYRTEC) 10 MG tablet Take 10 mg by mouth daily as needed for allergies.    [name] escitalopram (LEXAPRO) 10 MG tablet TAKE 1 TABLET (10 MG) BY MOUTH DAILY AT BEDTIME. 90 tablet 1  . famotidine (PEPCID) 20 MG tablet Take 20 mg by mouth 2 (two) times daily.  2  . feeding supplement, ENSURE  ENLIVE, (ENSURE ENLIVE) LIQD Take 237 mLs by mouth 2 (two) times daily between meals. 237 mL 12  . LORazepam (ATIVAN) 0.5 MG tablet Take 1 tab daily as needed for anxiety 30 tablet 2  . loteprednol (LOTEMAX) 0.5 % ophthalmic suspension Place 1 drop into both eyes daily.    . Magnesium Oxide 200 MG TABS Take 1 tablet (200 mg total) by mouth daily. (Patient taking differently: Take 100 mg by mouth at bedtime. ) 90 tablet 3  . psyllium (METAMUCIL SMOOTH TEXTURE) 28 % packet Take 1 packet by mouth as needed.     [name] REFRESH TEARS 0.5 % SOLN Place 1 drop into both eyes daily as needed (dry eyes).   0  . spironolactone (ALDACTONE) 25 MG tablet Take 1 tablet (25 mg total) by mouth daily. 90 tablet 3  . SYNTHROID 100 MCG tablet Take 1 tablet (100 mcg total) by mouth daily before breakfast. 30 tablet 11  . tolterodine (DETROL LA) 2 MG 24 hr capsule Take 2 mg by mouth daily.     No facility-administered medications prior to visit.     Review of Systems  Review of Systems  Constitutional: Negative.   HENT: Negative.   Respiratory: Positive for shortness of breath. Negative for cough and wheezing.   Cardiovascular: Positive for leg swelling.    Physical Exam  BP 110/80 (BP Location: Right Arm, Cuff Size: Normal)   Pulse 70   Temp 98.1 F (36.7 C)   Ht 5\' 3"  (1.6 m)   Wt 170 lb (77.1 kg)   SpO2 100%   BMI 30.11 kg/m  Physical Exam Constitutional:      Appearance: Normal appearance. She is normal weight.  HENT:     Head: Normocephalic and atraumatic.     Nose: Nose normal.     Mouth/Throat:     Mouth: Mucous membranes are moist.     Pharynx: Oropharynx is clear.  Neck:     Musculoskeletal: Normal range of motion and neck supple.  Cardiovascular:     Rate and Rhythm: Normal rate and regular rhythm.     Comments: Regular rate and rhythm  +trace BLE edema  Pulmonary:     Effort: Pulmonary effort is normal.     Breath sounds: Normal breath sounds. No wheezing or rhonchi.     Comments:  CTA  Musculoskeletal: Normal range of motion.  Skin:    General: Skin is warm and dry.  Neurological:     General: No focal deficit present.     Mental Status: She is alert. Mental status is at baseline.  Psychiatric:        Mood and Affect: Mood normal.        Behavior: Behavior normal.        Thought Content: Thought content normal.  Judgment: Judgment normal.      Lab Results:  CBC    Component Value Date/Time   WBC 3.0 (L) 03/29/2018 1113   RBC 3.72 (L) 03/29/2018 1113   HGB 12.5 03/29/2018 1113   HGB 13.7 10/30/2008 1506   HCT 37.3 03/29/2018 1113   HCT 40.0 10/30/2008 1506   PLT 190.0 03/29/2018 1113   PLT 234 10/30/2008 1506   MCV 100.3 (H) 03/29/2018 1113   MCV 95.6 10/30/2008 1506   MCH 33.4 02/26/2018 1441   MCHC 33.5 03/29/2018 1113   RDW 14.1 03/29/2018 1113   RDW 13.9 10/30/2008 1506   LYMPHSABS 0.8 03/29/2018 1113   LYMPHSABS 1.7 10/30/2008 1506   MONOABS 0.3 03/29/2018 1113   MONOABS 0.8 10/30/2008 1506   EOSABS 0.1 03/29/2018 1113   EOSABS 0.1 10/30/2008 1506   BASOSABS 0.0 03/29/2018 1113   BASOSABS 0.0 10/30/2008 1506    BMET    Component Value Date/Time   NA 138 03/29/2018 1113   K 4.5 03/29/2018 1113   CL 102 03/29/2018 1113   CO2 29 03/29/2018 1113   GLUCOSE 96 03/29/2018 1113   BUN 31 (H) 03/29/2018 1113   CREATININE 1.18 03/29/2018 1113   CALCIUM 9.3 03/29/2018 1113   GFRNONAA 42 (L) 02/26/2018 1441   GFRAA 48 (L) 02/26/2018 1441    BNP    Component Value Date/Time   BNP 81.9 03/22/2016 1230    ProBNP    Component Value Date/Time   PROBNP 152.0 (H) 02/01/2018 1156    Imaging: No results found.   Assessment & Plan:   PE (pulmonary thromboembolism) ([place]) - CTA 02/03/18 showed new right lower lobe pulmonary embolism, no right heart strain - Continues Eliquis 5mg  twice daily - PE appears unprovoked, she will likely need treatment with blood thinner for 6-12 months. Refer to hematology for guidance of duration of  treatment and also d/t unprovoked DVT and PE. Will need coag testing once off blood thinner   DVT (deep venous thrombosis) ([place]) - Vascular ultrasound lower extremity 02/04/18 - positive for deep vein thrombosis involving the left posterior tibial vein. No right DVT.  - Currently on Eliquis 5mg  twice daily - Repeat bilateral LE ultrasound in 3 months   Pulmonary hypertension - Echocardiogram on 01/17/18 - EF 37-90%; grade 1 diastolic dysfunction with no evidence of pulmonary hypertension   Mild persistent asthma - Spirometry 04/11/2018 showed no evidence of restriction or obstruction FVC 1.4 (82%), FEV1 1.1 (84%), ratio 79  DOE (dyspnea on exertion) - Attributing to current pulmonary emobolism, would expect her to have some dyspnea symptoms for at least 3 months - Ambulatory O2 sat today was normal; spO2 low 95% on room air with mild fatigue  - Encourage light exercise    Martyn Ehrich, NP 04/11/2018

## 2018-04-11 NOTE — Patient Instructions (Addendum)
Orders: Ambulatory O2 was normal - O2 low 95% room air  Spirometry today was normal   Bilateral LE dopplers in 3 months RE: right lower extremity DVT  Referral: Hematology re: unprovoked DVT and PE   Recommendations: Continue Eliquis for at least 6-12 months, to not stop until provider tell you to discontinue   Safety: Wear sneakers while exercising Avoid contact sports and high height  Follow-up: 3 months with Dr. Chase Caller or NP after testing    Pulmonary Embolism A pulmonary embolism (PE) is a sudden blockage or decrease of blood flow in one lung or both lungs. Most blockages come from a blood clot that forms in a lower leg, thigh, or arm vein (deep vein thrombosis, DVT) and travels to the lungs. A clot is blood that has thickened into a gel or solid. PE is a dangerous and life-threatening condition that needs to be treated right away. What are the causes? This condition is usually caused by a blood clot that forms in a vein and moves to the lungs. In rare cases, it may be caused by air, fat, part of a tumor, or other tissue that moves through the veins and into the lungs. What increases the risk? The following factors may make you more likely to develop this condition:  Having DVT or a history of DVT.  Being older than age 21.  Personal or family history of blood clots or blood clotting disease.  Major or lengthy surgery.  Orthopedic surgery, especially hip or knee replacement.  Traumatic injury, such as breaking a hip or leg.  Spinal cord injury.  Stroke.  Taking medicines that contain estrogen. These include birth control pills and hormone replacement therapy.  Long-term (chronic) lung or heart disease.  Cancer and chemotherapy.  Having a central venous catheter.  Pregnancy and the period after delivery.  What are the signs or symptoms? Symptoms of this condition usually start suddenly and include:  Shortness of breath while active or at  rest.  Coughing or coughing up blood or blood-tinged mucus.  Chest pain that is often worse with deep breaths.  Rapid or irregular heartbeat.  Feeling light-headed or dizzy.  Fainting.  Feeling anxious.  Sweating.  Pain and swelling in a leg. This is a symptom of DVT, which can lead to PE.  How is this diagnosed? This condition may be diagnosed based on:  Your medical history.  A physical exam.  Blood tests to check blood oxygen level and how well your blood clots, and a D-dimer blood test, which checks your blood for a substance that is released when a blood clot breaks apart.  CT pulmonary angiogram. This test checks blood flow in and around your lungs.  Ventilation-perfusion scan, also called a lung VQ scan. This test measures air flow and blood flow to the lungs.  Ultrasound of the legs to look for blood clots.  How is this treated? Treatment for this conditions depends on many factors, such as the cause of your PE, your risk for bleeding or developing more clots, and other medical conditions you have. Treatment aims to remove, dissolve, or stop blood clots from forming or growing larger. Treatment may include:  Blood thinning medicines (anticoagulants) to stop clots from forming or growing. These medicines may be given as a pill, as an injection, or through an IV tube (infusion).  Medicines that dissolve clots (thrombolytics).  A procedure in which a flexible tube is used to remove a blood clot (embolectomy) or deliver medicine to  destroy it (catheter-directed thrombolysis).  A procedure in which a filter is inserted into a large vein that carries blood to the heart (inferior vena cava). This filter (vena cava filter) catches blood clots before they reach the lungs.  Surgery to remove the clot (surgical embolectomy). This is rare.  You may need a combination of immediate, long-term (up to 3 months after diagnosis), and extended (more than 3 months after diagnosis)  treatments. Your treatment may continue for several months (maintenance therapy). You and your health care provider will work together to choose the treatment program that is best for you. Follow these instructions at home: If you are taking an anticoagulant medicine:  Take the medicine every day at the same time each day.  Understand what foods and drugs interact with your medicine.  Understand the side effects of this medicine, including excessive bruising or bleeding. Ask your health care provider or pharmacist about other side effects. General instructions  Take over-the-counter and prescription medicines only as told by your health care provider.  Anticoagulant medicines may cause side effects, including easy bruising and difficulty stopping bleeding. If you are prescribed an anticoagulant: ? Hold pressure over cuts for longer than usual. ? Tell your dentist and other health care providers that you are taking anticoagulants before you have any procedure that may cause bleeding. ? Avoid contact sports. ? Be extra careful when handling sharp objects. ? Use a soft toothbrush. Floss with waxed dental floss. ? Shave with an Copy.  Wear a medical alert bracelet or carry a medical alert card that says you have had a PE.  Ask your health care provider when you may return to your normal activities.  Talk with your health care provider about any travel plans. It is important to make sure that you are still able to take your medicine while on trips.  Keep all follow-up visits as told by your health care provider. This is important. How is this prevented? Take these actions to lower your risk of developing another PE:  Exercise regularly. Take frequent walks. For at least 30 minutes every day, engage in: ? Activity that involves moving your arms and legs. ? Activity that encourages good blood flow through your body by increasing your heart rate.  While traveling, drink plenty of  water and avoid drinking alcohol. Ask your health care provider if you should wear below-the-knee compression stockings.  Avoid sitting or lying in bed for long periods of time without moving your legs. Exercise your arms and legs every hour during long-distance travel (over 4 hours).  If you are hospitalized or have surgery, ask your health care provider about your risks and what treatments can help prevent blood clots.  Maintain a healthy weight. Ask your health care provider what weight is healthy for you.  If you are a woman who is over age 65, avoid unnecessary use of medicines that contain estrogen, including birth control pills.  Do not use any products that contain nicotine or tobacco, such as cigarettes and e-cigarettes. This is especially important if you take estrogen medicines. If you need help quitting, ask your health care provider.  See your health care provider for regular checkups. This may include blood tests and ultrasound testing on your legs to check for new blood clots.  Contact a health care provider if:  You missed a dose of your blood thinner medicine. Get help right away if:  You have new or increased pain, swelling, warmth, or redness in an  arm or leg.  You have numbness or tingling in an arm or leg.  You have shortness of breath while active or at rest.  You have chest pain.  You have a rapid or irregular heartbeat.  You feel light-headed or dizzy.  You cough up blood.  You have blood in your vomit, stool, or urine.  You have a fever.  You have abdomen (abdominal) pain.  You have a severe fall or head injury.  You have a severe headache.  You have vision changes.  You cannot move your arms or legs.  You are confused or have memory loss.  You are bleeding for 10 minutes or more, even with strong pressure on the wound. These symptoms may represent a serious problem that is an emergency. Do not wait to see if the symptoms will go away. Get  medical help right away. Call your local emergency services (911 in the [place] Do not drive yourself to the hospital. Summary  A pulmonary embolism (PE) is a sudden blockage or decrease of blood flow in one lung or both lungs. PE is a dangerous and life-threatening condition that needs to be treated right away.  Having deep vein thrombosis (DVT) or a history of DVT is the most common risk factor for PE.  Treatments for this condition usually include medicines to thin your blood (anticoagulants) or medicines to break apart blood clots (thrombolytics).  If you are prescribed blood thinners, it is important to take the medicine every single day at the same time each day.  If you have signs of PE or DVT, call your local emergency services (911 in the [place] This information is not intended to replace advice given to you by your health care provider. Make sure you discuss any questions you have with your health care provider. Document Released: 04/09/2000 Document Revised: 05/15/2016 Document Reviewed: 05/15/2016 Elsevier Interactive Patient Education  2018 [employer].

## 2018-04-11 NOTE — Assessment & Plan Note (Signed)
-   Spirometry 04/11/2018 showed no evidence of restriction or obstruction FVC 1.4 (82%), FEV1 1.1 (84%), ratio 79

## 2018-04-11 NOTE — Assessment & Plan Note (Addendum)
-   Attributing to current pulmonary emobolism, would expect her to have some dyspnea symptoms for at least 3 months - Ambulatory O2 sat today was normal; spO2 low 95% on room air with mild fatigue  - Encourage light exercise

## 2018-04-11 NOTE — Assessment & Plan Note (Signed)
-   Vascular ultrasound lower extremity 02/04/18 - positive for deep vein thrombosis involving the left posterior tibial vein. No right DVT.  - Currently on Eliquis 5mg  twice daily - Repeat bilateral LE ultrasound in 3 months

## 2018-04-14 DIAGNOSIS — J301 Allergic rhinitis due to pollen: Secondary | ICD-10-CM | POA: Diagnosis not present

## 2018-04-14 DIAGNOSIS — J3089 Other allergic rhinitis: Secondary | ICD-10-CM | POA: Diagnosis not present

## 2018-04-17 ENCOUNTER — Telehealth: Payer: Self-pay | Admitting: Internal Medicine

## 2018-04-17 NOTE — Telephone Encounter (Signed)
Called and spoke with patient, she stated that she is to be in the car tomorrow for 10 hours while driving to [place]. She was last seen by beth and was diagnosed with a DVT and pulmonary embolism back in October. Patient wanted to make sure that this was ok to do. Spoke with Beth who was beside of me and she and TP stated that as long as the patient stops q2h and gets out to walk for a few minutes she would be fine. Called patient back and advised her of this. Patient verbalized understanding. Nothing further needed.

## 2018-04-24 ENCOUNTER — Ambulatory Visit (INDEPENDENT_AMBULATORY_CARE_PROVIDER_SITE_OTHER): Payer: Medicare Other | Admitting: Psychology

## 2018-04-24 DIAGNOSIS — F332 Major depressive disorder, recurrent severe without psychotic features: Secondary | ICD-10-CM

## 2018-05-01 DIAGNOSIS — J3089 Other allergic rhinitis: Secondary | ICD-10-CM | POA: Diagnosis not present

## 2018-05-01 DIAGNOSIS — J301 Allergic rhinitis due to pollen: Secondary | ICD-10-CM | POA: Diagnosis not present

## 2018-05-03 ENCOUNTER — Telehealth: Payer: Self-pay | Admitting: Cardiology

## 2018-05-03 NOTE — Telephone Encounter (Signed)
New message    Patient states that she can't make appt on 05/15/2018 because she has another appt that day and she does not want to see Dr. Radford Pax. She would like to see if she can be worked in. Please call patient to discuss.

## 2018-05-03 NOTE — Telephone Encounter (Signed)
Left message for patient to call back  

## 2018-05-04 ENCOUNTER — Ambulatory Visit: Payer: Self-pay | Admitting: Cardiology

## 2018-05-09 NOTE — Telephone Encounter (Signed)
Spoke with the patient, she was scheduled for 2/7 with Dr. Radford Pax.

## 2018-05-15 ENCOUNTER — Encounter: Payer: Medicare Other | Attending: Internal Medicine | Admitting: Dietician

## 2018-05-15 ENCOUNTER — Encounter: Payer: Self-pay | Admitting: Dietician

## 2018-05-15 DIAGNOSIS — E669 Obesity, unspecified: Secondary | ICD-10-CM | POA: Diagnosis not present

## 2018-05-15 NOTE — Progress Notes (Signed)
Medical Nutrition Therapy:  Appt start time: 3300 end time:  1130.   Assessment:  Primary concerns today:  Patient is here today alone.  She was referred for obesity.  She is concerned about her recent weight gain.  She states that she has a decreased appetite and delays meals because of this.  She states that she is tired all of the time. She states that in the past she would pass out because she is hungry.  She has not done this in the past year since moving.  She was instructed to drink a nutritional shake due to this and had been drinking Orgain until recently. Based on her nutritional intake, she is not eating excessive calories and eats no sugar or flour as well as follows a balanced vegan diet.  She is getting adequate protein.  Concern about vitamin D as she was deficient in the past.    History includes hypothyroidism, pulmonary HTN, GERD, HTN, Afib, dysphagia, breast cancer x2, CKD stage 3 dysphagia, and pulmonary embolism 02/03/18. She states that 1 year ago weight was 164 lbs.  She is concerned that she has also been gaining fluid weight.  Her weight at home this am was 174 lbs.  Patient lives at [place] apartment.  She cooks for herself as she is vegan.  She does not have a stove but uses a crockpot, electric skillet and microwave to prepare her meals. She has been a vegan since 2010.  She cannot tolerate dairy due to IBS.   She is retired from [employer]).    Preferred Learning Style:   No preference indicated   Learning Readiness:   Ready  Change in progress   MEDICATIONS: see list  Estimated needs:  1400 calories, 65 grams protein DIETARY INTAKE:  Vegan, no white flour, no sugar, broccoli (causes diarrhea), no soda, juice, bread, added salt Vegan since 2010 ("due to having breast cancer twice") Occasional Orgain nutrition shake  24-hr recall:  B (8:30 AM): 1 cup regular oatmeal, 1 T almond butter, silk  unsweetened almond milk, banana Snk ( AM): none  L ( PM): none Snk ( PM): none D (5-6 PM): vegetables, black bean casserole OR tempeh and greens, japanese sweet potato, occasional baked apple Snk ( PM): none Beverages: water  Usual physical activity: walks about 30 minutes most days, exercise class many days  Progress Towards Goal(s):  In progress.   Nutritional Diagnosis:  NB-1.1 Food and nutrition-related knowledge deficit As related to eating for weight management.  As evidenced by patient report.    Intervention:  Nutrition education related to healthy nutrition.  Discussed that she should not follow a weight loss diet but continue to choose healthy foods and be mindful of portion sizes and satiety.  Recommended a small meal mid-day.  Discussed that processed vegan proteins often have more salt and continue to be mindful about her sodium intake.  Patient states that she is more active now that she was in the past.   Consider taking Vitamin D 2,000 units daily Consider getting your Vitamin D level checked. Recommend continuing to get your vitamin B-12 checked yearly Discuss fluid retention with your MD  Continue to stay very active Continue your healthy diet Consider a light meal at lunch (homemade or Low sodium soup and fruit for example). Continue to avoid salt Avoid using a lot of oil Consider using beans or lentils more often than the processed vegetarian products Eat slowly and stop  when you are satisfied  Teaching Method Utilized:  Visual Auditory   Barriers to learning/adherence to lifestyle change: none  Demonstrated degree of understanding via:  Teach Back   Monitoring/Evaluation:  Dietary intake, exercise, and body weight prn.

## 2018-05-15 NOTE — Patient Instructions (Addendum)
Consider taking Vitamin D 2,000 units daily Consider getting your Vitamin D level checked. Recommend continuing to get your vitamin B-12 checked yearly Discuss fluid retention with your MD  Continue to stay very active Continue your healthy diet Consider a light meal at lunch (homemade or Low sodium soup and fruit for example). Continue to avoid salt Avoid using a lot of oil Consider using beans or lentils more often than the processed vegetarian products Eat slowly and stop when you are satisfied

## 2018-05-17 DIAGNOSIS — H401231 Low-tension glaucoma, bilateral, mild stage: Secondary | ICD-10-CM | POA: Diagnosis not present

## 2018-05-17 DIAGNOSIS — H43813 Vitreous degeneration, bilateral: Secondary | ICD-10-CM | POA: Diagnosis not present

## 2018-05-20 LAB — CUP PACEART REMOTE DEVICE CHECK
Battery Impedance: 3900 Ohm
Battery Remaining Longevity: 14 mo
Battery Voltage: 2.7 V
Brady Statistic AP VP Percent: 4 %
Brady Statistic AS VP Percent: 0 %
Brady Statistic AS VS Percent: 6 %
Date Time Interrogation Session: 20191212184334
Implantable Lead Implant Date: 20090527
Implantable Lead Implant Date: 20090527
Implantable Lead Location: 753859
Implantable Lead Location: 753860
Implantable Lead Model: 5076
Implantable Lead Model: 5076
Implantable Pulse Generator Implant Date: 20090527
Lead Channel Impedance Value: 534 Ohm
Lead Channel Impedance Value: 538 Ohm
Lead Channel Pacing Threshold Amplitude: 0.5 V
Lead Channel Pacing Threshold Amplitude: 0.625 V
Lead Channel Pacing Threshold Pulse Width: 0.4 ms
Lead Channel Pacing Threshold Pulse Width: 0.4 ms
Lead Channel Setting Pacing Amplitude: 2 V
Lead Channel Setting Pacing Amplitude: 2.5 V
Lead Channel Setting Pacing Pulse Width: 0.4 ms
Lead Channel Setting Sensing Sensitivity: 2 mV
MDC IDC STAT BRADY AP VS PERCENT: 90 %

## 2018-05-22 ENCOUNTER — Ambulatory Visit (INDEPENDENT_AMBULATORY_CARE_PROVIDER_SITE_OTHER): Payer: Medicare Other | Admitting: Psychology

## 2018-05-22 DIAGNOSIS — F332 Major depressive disorder, recurrent severe without psychotic features: Secondary | ICD-10-CM | POA: Diagnosis not present

## 2018-05-24 ENCOUNTER — Telehealth: Payer: Self-pay | Admitting: Primary Care

## 2018-05-24 DIAGNOSIS — I2699 Other pulmonary embolism without acute cor pulmonale: Secondary | ICD-10-CM

## 2018-05-24 DIAGNOSIS — I824Z2 Acute embolism and thrombosis of unspecified deep veins of left distal lower extremity: Secondary | ICD-10-CM

## 2018-05-24 DIAGNOSIS — J301 Allergic rhinitis due to pollen: Secondary | ICD-10-CM | POA: Diagnosis not present

## 2018-05-24 DIAGNOSIS — J3089 Other allergic rhinitis: Secondary | ICD-10-CM | POA: Diagnosis not present

## 2018-05-24 NOTE — Telephone Encounter (Signed)
Brittany/pulmonary has advised that hematology referral will be placed, she will contact patient to advise on how to proceed now

## 2018-05-24 NOTE — Telephone Encounter (Signed)
Call made to [facility], requesting update on hematology referral. Informed Jonelle Sidle I did not see a order for the referral. Upon review of AVS referral to hematology was needed for DVT and PE. Referral placed and marked urgent. Left message with patient making her aware she should be hearing from someone soon. Nothing further is needed at this time.

## 2018-05-24 NOTE — Telephone Encounter (Signed)
I can see where hematology referral was discussed in 12/19 office visit with walsh,NP---however, I don't see that the referral was placed---I have contacted Kim/pulmonary and she is sending note to triage nurse in pulmonary to find out if this referral needs to be placed or what recommendations walsh,NP has for patient to proceed with care for PE/DVT at this time---can talk with Gaylene Moylan,RN if any further questions

## 2018-05-29 ENCOUNTER — Encounter: Payer: Self-pay | Admitting: Adult Health

## 2018-05-29 ENCOUNTER — Telehealth: Payer: Self-pay | Admitting: Adult Health

## 2018-05-29 NOTE — Telephone Encounter (Signed)
A new hem appt has been scheduled for the pt to see [name] on 2/19 at [place] has been sent to the referring office to notify the pt. Letter mailed.

## 2018-05-31 DIAGNOSIS — J301 Allergic rhinitis due to pollen: Secondary | ICD-10-CM | POA: Diagnosis not present

## 2018-05-31 DIAGNOSIS — J3089 Other allergic rhinitis: Secondary | ICD-10-CM | POA: Diagnosis not present

## 2018-06-02 ENCOUNTER — Ambulatory Visit (INDEPENDENT_AMBULATORY_CARE_PROVIDER_SITE_OTHER): Payer: Medicare Other | Admitting: Cardiology

## 2018-06-02 ENCOUNTER — Encounter: Payer: Self-pay | Admitting: Cardiology

## 2018-06-02 VITALS — BP 102/70 | HR 81 | Ht 63.0 in | Wt 171.0 lb

## 2018-06-02 DIAGNOSIS — G4733 Obstructive sleep apnea (adult) (pediatric): Secondary | ICD-10-CM

## 2018-06-02 DIAGNOSIS — I10 Essential (primary) hypertension: Secondary | ICD-10-CM | POA: Diagnosis not present

## 2018-06-02 NOTE — Patient Instructions (Signed)

## 2018-06-02 NOTE — Progress Notes (Signed)
Cardiology Office Note:    Date:  06/02/2018   ID:  [name], DOB [birth year], MRN [id number]  PCP:  Cassandria Anger, MD  Cardiologist:  No primary care provider on file.    Referring MD: Cassandria Anger, MD   Chief Complaint  Patient presents with  . Sleep Apnea  . Hypertension    History of Present Illness:    [name] is a 85 y.o. female with a hx of mild OSA with an AHI of 12/hr on PSG with no significant oxygen desaturations but loud snoring noted. She is on CPAP at 12cm H2O.  He is doing well with his CPAP device and thinks that he has gotten used to it.  He tolerates the mask and feels the pressure is adequate.  Since going on CPAP he feels rested in the am and has no significant daytime sleepiness.  He denies any significant mouth or nasal dryness or nasal congestion.  He does not think that he snores.     Past Medical History:  Diagnosis Date  . Anxiety   . Bradycardia   . Breast cancer, left breast ([place]) 1995  . Breast cancer, right breast ([place]) 2002  . CHF (congestive heart failure) ([place])   . Depression    Dr Cheryln Manly  . Discoid lupus    skin  . Full dentures   . GERD (gastroesophageal reflux disease)   . Glaucoma   . Gout   . Hx of echocardiogram    a.  Echocardiogram (03/2011): Mild LVH, EF 89-37%, grade 1 diastolic dysfunction, mild MR, mild to moderate TR, PASP 42;  b. Echo (05/2013):  Mod LVH, EF 65-70%, no WMA, Gr 1 DD, mild MR, mod TR, PASP 39  . Hx of exercise stress test    a. ETT-echocardiogram (06/08/11): Sub-optimal exercise. Test stopped early due to dizziness and hypotension. EF 60%.  Non-diagnostic.;   b. Lexiscan Myoview (06/2013):  Diaphragmatic attenuation, no ischemia, EF 61%.  Low Risk  . Hypertension   . Hypothyroidism   . IBS (irritable bowel syndrome)   . IBS (irritable bowel syndrome)   . Incontinence   . OSA (obstructive sleep apnea) 07/20/2016   Mild with AHI 12/hr now on CPAP  . OSA on CPAP    "quit wearing mask;  retested; not enough information; to be tested again 04/08/2016"  . Osteoarthritis   . Osteopenia   . Pacemaker 2009   Brady//Chronotropic incompetence with normal pacemaker function  . Thyroid disease   . Wears hearing aid    both ears    Past Surgical History:  Procedure Laterality Date  . BREAST BIOPSY Left 1995  . BREAST LUMPECTOMY Left 1995   axillary node dissectoin  . CARDIAC CATHETERIZATION N/A 02/13/2016   Procedure: Right Heart Cath;  Surgeon: Larey Dresser, MD;  Location: [facility];  Service: Cardiovascular;  Laterality: N/A;  . CATARACT EXTRACTION W/ INTRAOCULAR LENS  IMPLANT, BILATERAL Bilateral 06/2009 - 12.2915   left - right  . DORSAL COMPARTMENT RELEASE  2000   left  . HAMMER TOE SURGERY Left 2004  . HEMORRHOID BANDING    . INSERT / REPLACE / REMOVE PACEMAKER  2009  . KNEE ARTHROSCOPY Left 08/29/2012   Procedure: LEFT KNEE ARTHROSCOPY ;  Surgeon: Hessie Dibble, MD;  Location: [facility];  Service: Orthopedics;  Laterality: Left;  [name] MASTECTOMY Bilateral 09/2000   nbx  . TRIGGER FINGER RELEASE Right 07/31/2013   Procedure: EXCISE MASS RIGHT INDEX  A-1 PLLLEY RELEASE A-1 RIGHT INDEX;  Surgeon: Cammie Sickle., MD;  Location: [facility];  Service: Orthopedics;  Laterality: Right;  [name] VAGINAL HYSTERECTOMY      Current Medications: Current Meds  Medication Sig  . apixaban (ELIQUIS) 5 MG TABS tablet Take 1 tablet (5 mg total) by mouth 2 (two) times daily.  . cetirizine (ZYRTEC) 10 MG tablet Take 10 mg by mouth daily as needed for allergies.  [name] escitalopram (LEXAPRO) 10 MG tablet TAKE 1 TABLET (10 MG) BY MOUTH DAILY AT BEDTIME.  . famotidine (PEPCID) 20 MG tablet Take 20 mg by mouth 2 (two) times daily.  . feeding supplement, ENSURE ENLIVE, (ENSURE ENLIVE) LIQD Take 237 mLs by mouth 2 (two) times daily between meals.  [name] LORazepam (ATIVAN) 0.5 MG tablet Take 1 tab daily as needed for anxiety  . loteprednol (LOTEMAX) 0.5 %  ophthalmic suspension Place 1 drop into both eyes daily.  . Magnesium Oxide 200 MG TABS Take 1 tablet (200 mg total) by mouth daily. (Patient taking differently: Take 100 mg by mouth at bedtime. )  . Multiple Vitamin (MULTIVITAMIN WITH MINERALS) TABS tablet Take 1 tablet by mouth daily.  . psyllium (METAMUCIL SMOOTH TEXTURE) 28 % packet Take 1 packet by mouth as needed.   [name] REFRESH TEARS 0.5 % SOLN Place 1 drop into both eyes daily as needed (dry eyes).   [name] spironolactone (ALDACTONE) 25 MG tablet Take 1 tablet (25 mg total) by mouth daily.  [name] SYNTHROID 100 MCG tablet Take 1 tablet (100 mcg total) by mouth daily before breakfast.  . tolterodine (DETROL LA) 2 MG 24 hr capsule Take 2 mg by mouth daily.     Allergies:   Lactase; Amlodipine besy-benazepril hcl; Amlodipine besylate; Clonidine hydrochloride; Lactose intolerance (gi); Valsartan; and Verapamil   Social History   Socioeconomic History  . Marital status: Widowed    Spouse name: Not on file  . Number of children: 3  . Years of education: 80  . Highest education level: Not on file  Occupational History  . Occupation: retired  Scientific laboratory technician  . Financial resource strain: Not hard at all  . Food insecurity:    Worry: Never true    Inability: Never true  . Transportation needs:    Medical: No    Non-medical: No  Tobacco Use  . Smoking status: Never Smoker  . Smokeless tobacco: Never Used  Substance and Sexual Activity  . Alcohol use: No    Alcohol/week: 0.0 standard drinks  . Drug use: No  . Sexual activity: Not Currently  Lifestyle  . Physical activity:    Days per week: Not on file    Minutes per session: Not on file  . Stress: Not on file  Relationships  . Social connections:    Talks on phone: Not on file    Gets together: Not on file    Attends religious service: Not on file    Active member of club or organization: Not on file    Attends meetings of clubs or organizations: Not on file    Relationship status: Not  on file  Other Topics Concern  . Not on file  Social History Narrative   She is a vegan now 2010.  Lives at [facility].  Retired from Orthoptist.  Education: college.  3 children.      Family History: The patient's family history includes Cancer in her brother; Heart disease in her brother and father; Ovarian cancer  in her mother; Stroke in her brother and sister.  ROS:   Please see the history of present illness.    ROS  All other systems reviewed and negative.   EKGs/Labs/Other Studies Reviewed:    The following studies were reviewed today: PAP download  EKG:  EKG is not ordered today.    Recent Labs: 11/29/2017: ALT 9 02/01/2018: Pro B Natriuretic peptide (BNP) 152.0; TSH 0.74 02/26/2018: Magnesium 2.1 03/29/2018: BUN 31; Creatinine, Ser 1.18; Hemoglobin 12.5; Platelets 190.0; Potassium 4.5; Sodium 138   Recent Lipid Panel    Component Value Date/Time   CHOL 174 05/11/2010 1229   TRIG 78.0 05/11/2010 1229   HDL 52.30 05/11/2010 1229   CHOLHDL 3 05/11/2010 1229   VLDL 15.6 05/11/2010 1229   LDLCALC 106 (H) 05/11/2010 1229   LDLDIRECT 130.8 09/30/2006 1047    Physical Exam:    VS:  There were no vitals taken for this visit.    Wt Readings from Last 3 Encounters:  04/11/18 170 lb (77.1 kg)  03/29/18 170 lb (77.1 kg)  03/27/18 177 lb 4 oz (80.4 kg)     GEN:  Well nourished, well developed in no acute distress HEENT: Normal NECK: No JVD; No carotid bruits LYMPHATICS: No lymphadenopathy CARDIAC: RRR, no murmurs, rubs, gallops RESPIRATORY:  Clear to auscultation without rales, wheezing or rhonchi  ABDOMEN: Soft, non-tender, non-distended MUSCULOSKELETAL:  No edema; No deformity  SKIN: Warm and dry NEUROLOGIC:  Alert and oriented x 3 PSYCHIATRIC:  Normal affect   ASSESSMENT:    1. OSA (obstructive sleep apnea)   2. Benign essential HTN    PLAN:    In order of problems listed above:  1.  OSA - the patient is tolerating PAP therapy well without any  problems. The PAP download was reviewed today and showed an AHI of 15 /hr on 13 cm H2O with 43 % compliance in using more than 4 hours nightly.  The patient has been using and benefiting from PAP use and will continue to benefit from therapy.  He appears to have a very large mask leak.  2.  HTN - BP is well controlled on exam today.  She will continue on spironolactone 25 mg daily.     Medication Adjustments/Labs and Tests Ordered: Current medicines are reviewed at length with the patient today.  Concerns regarding medicines are outlined above.  No orders of the defined types were placed in this encounter.  No orders of the defined types were placed in this encounter.   Signed, Fransico Him, MD  06/02/2018 11:24 AM    [place]

## 2018-06-05 ENCOUNTER — Telehealth: Payer: Self-pay | Admitting: *Deleted

## 2018-06-05 NOTE — Telephone Encounter (Signed)
-----   Message from Sarina Ill, RN sent at 06/02/2018  5:23 PM EST ----- Regarding: Sleep Hello, Dr. Radford Pax ordered a chin strap and a download in 4 weeks after new mask. Please get her an appointment with [facility]. Thanks, [employer]

## 2018-06-05 NOTE — Telephone Encounter (Signed)
Order placed to [facility] for a chin strap and an appt for a mask fit has been made. Meredith at [facility] will contact the patient.

## 2018-06-06 ENCOUNTER — Ambulatory Visit: Payer: Self-pay | Admitting: Physician Assistant

## 2018-06-07 DIAGNOSIS — J301 Allergic rhinitis due to pollen: Secondary | ICD-10-CM | POA: Diagnosis not present

## 2018-06-07 DIAGNOSIS — J3089 Other allergic rhinitis: Secondary | ICD-10-CM | POA: Diagnosis not present

## 2018-06-12 ENCOUNTER — Ambulatory Visit (INDEPENDENT_AMBULATORY_CARE_PROVIDER_SITE_OTHER): Payer: Medicare Other | Admitting: Internal Medicine

## 2018-06-12 ENCOUNTER — Encounter: Payer: Self-pay | Admitting: Internal Medicine

## 2018-06-12 VITALS — BP 118/78 | HR 68 | Ht 63.0 in | Wt 171.0 lb

## 2018-06-12 DIAGNOSIS — I495 Sick sinus syndrome: Secondary | ICD-10-CM | POA: Diagnosis not present

## 2018-06-12 DIAGNOSIS — Z950 Presence of cardiac pacemaker: Secondary | ICD-10-CM

## 2018-06-12 DIAGNOSIS — I480 Paroxysmal atrial fibrillation: Secondary | ICD-10-CM | POA: Diagnosis not present

## 2018-06-12 DIAGNOSIS — I5032 Chronic diastolic (congestive) heart failure: Secondary | ICD-10-CM

## 2018-06-12 DIAGNOSIS — G4733 Obstructive sleep apnea (adult) (pediatric): Secondary | ICD-10-CM | POA: Diagnosis not present

## 2018-06-12 NOTE — Progress Notes (Signed)
Patient Care Team: Plotnikov, Evie Lacks, MD as PCP - General Arfeen, Arlyce Harman, MD (Psychiatry) Richmond Campbell, MD (Gastroenterology) Fay Records, MD (Cardiology) Johnathan Hausen, MD (General Surgery) Melrose Nakayama, MD as Consulting Physician (Orthopedic Surgery) Larey Dresser, MD as Consulting Physician (Cardiology) Deboraha Sprang, MD as Consulting Physician (Cardiology) Brand Males, MD as Consulting Physician (Pulmonary Disease)   HPI  [name] is a 85 y.o. female seen in followup for orthostatic intolerance, exercise intolerance, and prior pacemaker implantation for sinus node dysfunction.   . Stress echo 2012 normal LV function.   Echo (7/17): EF 55-60%, mild MR with mitral valve prolapse, moderate TR, normal RV size and systolic function, PASP 30 mmHg.  -Lexiscan Cardiolite (9/17) with EF 55%, no ischemia/infarction - RHC (10/17) with mean RA 6, PA 28/9, mean PCWP 7, CI 2.37  She fell again this morning.  She woke up on the floor she and her CPAP machine were scattered about.   Chronic but worsening dyspnea.  Was diagnosed with a blood clot after prior protracted lower extremity swelling and discomfort.  Also with pulmonary embolism.  On apixaban.  Seen by pulmonary and referred to hematology for duration of therapy.  Unilateral edema with scant on the left.  Date Cr K Hgb  12/19 1.18 4.5 12.5      Past Medical History:  Diagnosis Date  . Anxiety   . Bradycardia   . Breast cancer, left breast ([place]) 1995  . Breast cancer, right breast ([place]) 2002  . CHF (congestive heart failure) ([place])   . Depression    Dr Cheryln Manly  . Discoid lupus    skin  . Full dentures   . GERD (gastroesophageal reflux disease)   . Glaucoma   . Gout   . Hx of echocardiogram    a.  Echocardiogram (03/2011): Mild LVH, EF 85-27%, grade 1 diastolic dysfunction, mild MR, mild to moderate TR, PASP 42;  b. Echo (05/2013):  Mod LVH, EF 65-70%, no WMA, Gr 1 DD, mild MR, mod TR,  PASP 39  . Hx of exercise stress test    a. ETT-echocardiogram (06/08/11): Sub-optimal exercise. Test stopped early due to dizziness and hypotension. EF 60%.  Non-diagnostic.;   b. Lexiscan Myoview (06/2013):  Diaphragmatic attenuation, no ischemia, EF 61%.  Low Risk  . Hypertension   . Hypothyroidism   . IBS (irritable bowel syndrome)   . IBS (irritable bowel syndrome)   . Incontinence   . OSA (obstructive sleep apnea) 07/20/2016   Mild with AHI 12/hr now on CPAP  . OSA on CPAP    "quit wearing mask; retested; not enough information; to be tested again 04/08/2016"  . Osteoarthritis   . Osteopenia   . Pacemaker 2009   Brady//Chronotropic incompetence with normal pacemaker function  . Thyroid disease   . Wears hearing aid    both ears    Past Surgical History:  Procedure Laterality Date  . BREAST BIOPSY Left 1995  . BREAST LUMPECTOMY Left 1995   axillary node dissectoin  . CARDIAC CATHETERIZATION N/A 02/13/2016   Procedure: Right Heart Cath;  Surgeon: Larey Dresser, MD;  Location: [facility];  Service: Cardiovascular;  Laterality: N/A;  . CATARACT EXTRACTION W/ INTRAOCULAR LENS  IMPLANT, BILATERAL Bilateral 06/2009 - 12.2915   left - right  . DORSAL COMPARTMENT RELEASE  2000   left  . HAMMER TOE SURGERY Left 2004  . HEMORRHOID BANDING    . INSERT / REPLACE /  REMOVE PACEMAKER  2009  . KNEE ARTHROSCOPY Left 08/29/2012   Procedure: LEFT KNEE ARTHROSCOPY ;  Surgeon: Hessie Dibble, MD;  Location: [facility];  Service: Orthopedics;  Laterality: Left;  [name] MASTECTOMY Bilateral 09/2000   nbx  . TRIGGER FINGER RELEASE Right 07/31/2013   Procedure: EXCISE MASS RIGHT INDEX A-1 PLLLEY RELEASE A-1 RIGHT INDEX;  Surgeon: Cammie Sickle., MD;  Location: [facility];  Service: Orthopedics;  Laterality: Right;  [name] VAGINAL HYSTERECTOMY      Current Outpatient Medications  Medication Sig Dispense Refill  . apixaban (ELIQUIS) 5 MG TABS tablet Take 1 tablet  (5 mg total) by mouth 2 (two) times daily. 180 tablet 1  . cetirizine (ZYRTEC) 10 MG tablet Take 10 mg by mouth daily as needed for allergies.    [name] escitalopram (LEXAPRO) 10 MG tablet TAKE 1 TABLET (10 MG) BY MOUTH DAILY AT BEDTIME. 90 tablet 1  . famotidine (PEPCID) 20 MG tablet Take 20 mg by mouth 2 (two) times daily.  2  . feeding supplement, ENSURE ENLIVE, (ENSURE ENLIVE) LIQD Take 237 mLs by mouth 2 (two) times daily between meals. 237 mL 12  . LORazepam (ATIVAN) 0.5 MG tablet Take 1 tab daily as needed for anxiety 30 tablet 2  . loteprednol (LOTEMAX) 0.5 % ophthalmic suspension Place 1 drop into both eyes daily.    . Magnesium Oxide 200 MG TABS Take 1 tablet (200 mg total) by mouth daily. 90 tablet 3  . Multiple Vitamin (MULTIVITAMIN WITH MINERALS) TABS tablet Take 1 tablet by mouth daily.    . psyllium (METAMUCIL SMOOTH TEXTURE) 28 % packet Take 1 packet by mouth as needed.     [name] REFRESH TEARS 0.5 % SOLN Place 1 drop into both eyes daily as needed (dry eyes).   0  . spironolactone (ALDACTONE) 25 MG tablet Take 1 tablet (25 mg total) by mouth daily. 90 tablet 3  . SYNTHROID 100 MCG tablet Take 1 tablet (100 mcg total) by mouth daily before breakfast. 30 tablet 11  . tolterodine (DETROL LA) 2 MG 24 hr capsule Take 2 mg by mouth daily.     No current facility-administered medications for this visit.     Allergies  Allergen Reactions  . Lactase Diarrhea  . Amlodipine Besy-Benazepril Hcl Swelling  . Amlodipine Besylate Swelling  . Clonidine Hydrochloride     REACTION: tired  . Lactose Intolerance (Gi) Other (See Comments)  . Valsartan Other (See Comments)    Tongue swelling   . Verapamil     Review of Systems negative except from HPI and PMH  Physical Exam BP 118/78   Pulse 68   Ht 5\' 3"  (1.6 m)   Wt 171 lb (77.6 kg)   SpO2 98%   BMI 30.29 kg/m  Well developed and well nourished in no acute distress HENT normal Neck supple with JVP-6 Clear Device pocket well healed;  without hematoma or erythema.  There is no tethering  Regular rate and rhythm, no  gallop No  murmur Abd-soft with active BS No Clubbing cyanosis tr on L edema Skin-warm and dry A & Oriented  Grossly normal sensory and motor function   ECG  A pacing at 68 Intervals 31/14/45 with what I presume is fusion Compared with ECG 9/19 intrinsic conduction is no longer entirely on its own; however, during pacemaker interrogation, the AV delay during atrial pacing programmed at 260 ms atrial pacing was associated with V sense  Assessment and  Plan  Sinus node dysfunction   Pacemaker  Medtronic   Hypertension  Dyspnea-multifactorial  Depression  DVT PE   Ambulatory O2 saturations were in the 95-96% range.  No evidence of volume overload.  With worsening dyspnea, and on anticoagulation, we will check her hemoglobin.  It was relatively normal 12/19.  We will also repeat her echocardiogram although I suspect it will be stable.  We spent more than 50% of our >25 min visit in face to face counseling regarding the above

## 2018-06-12 NOTE — Patient Instructions (Signed)
Medication Instructions:  Your physician recommends that you continue on your current medications as directed. Please refer to the Current Medication list given to you today.  Labwork: You will have labs drawn today: CBC  Testing/Procedures: Your physician has requested that you have an echocardiogram. Echocardiography is a painless test that uses sound waves to create images of your heart. It provides your doctor with information about the size and shape of your heart and how well your heart's chambers and valves are working. This procedure takes approximately one hour. There are no restrictions for this procedure.   Follow-Up: Your physician recommends that you schedule a follow-up appointment in:   6 months with Dr Caryl Comes  Any Other Special Instructions Will Be Listed Below (If Applicable).     If you need a refill on your cardiac medications before your next appointment, please call your pharmacy.

## 2018-06-13 LAB — CBC
HEMOGLOBIN: 13.7 g/dL (ref 11.1–15.9)
Hematocrit: 40.6 % (ref 34.0–46.6)
MCH: 32.9 pg (ref 26.6–33.0)
MCHC: 33.7 g/dL (ref 31.5–35.7)
MCV: 97 fL (ref 79–97)
Platelets: 222 10*3/uL (ref 150–450)
RBC: 4.17 x10E6/uL (ref 3.77–5.28)
RDW: 13.3 % (ref 11.7–15.4)
WBC: 4.3 10*3/uL (ref 3.4–10.8)

## 2018-06-14 ENCOUNTER — Inpatient Hospital Stay: Payer: Medicare Other | Attending: Adult Health | Admitting: Adult Health

## 2018-06-14 ENCOUNTER — Inpatient Hospital Stay: Payer: Medicare Other

## 2018-06-14 ENCOUNTER — Encounter: Payer: Self-pay | Admitting: Adult Health

## 2018-06-14 ENCOUNTER — Other Ambulatory Visit: Payer: Self-pay | Admitting: Internal Medicine

## 2018-06-14 ENCOUNTER — Telehealth: Payer: Self-pay | Admitting: Hematology and Oncology

## 2018-06-14 VITALS — BP 117/79 | HR 80 | Temp 97.7°F | Resp 17 | Ht 60.0 in | Wt 178.0 lb

## 2018-06-14 DIAGNOSIS — F419 Anxiety disorder, unspecified: Secondary | ICD-10-CM | POA: Insufficient documentation

## 2018-06-14 DIAGNOSIS — Z86711 Personal history of pulmonary embolism: Secondary | ICD-10-CM | POA: Diagnosis not present

## 2018-06-14 DIAGNOSIS — I509 Heart failure, unspecified: Secondary | ICD-10-CM | POA: Insufficient documentation

## 2018-06-14 DIAGNOSIS — Z79899 Other long term (current) drug therapy: Secondary | ICD-10-CM | POA: Insufficient documentation

## 2018-06-14 DIAGNOSIS — Z7901 Long term (current) use of anticoagulants: Secondary | ICD-10-CM

## 2018-06-14 DIAGNOSIS — M109 Gout, unspecified: Secondary | ICD-10-CM | POA: Insufficient documentation

## 2018-06-14 DIAGNOSIS — I824Z2 Acute embolism and thrombosis of unspecified deep veins of left distal lower extremity: Secondary | ICD-10-CM

## 2018-06-14 DIAGNOSIS — K8020 Calculus of gallbladder without cholecystitis without obstruction: Secondary | ICD-10-CM | POA: Diagnosis not present

## 2018-06-14 DIAGNOSIS — Z853 Personal history of malignant neoplasm of breast: Secondary | ICD-10-CM | POA: Diagnosis not present

## 2018-06-14 DIAGNOSIS — Z950 Presence of cardiac pacemaker: Secondary | ICD-10-CM | POA: Insufficient documentation

## 2018-06-14 DIAGNOSIS — I110 Hypertensive heart disease with heart failure: Secondary | ICD-10-CM | POA: Insufficient documentation

## 2018-06-14 DIAGNOSIS — R1084 Generalized abdominal pain: Secondary | ICD-10-CM

## 2018-06-14 DIAGNOSIS — K589 Irritable bowel syndrome without diarrhea: Secondary | ICD-10-CM | POA: Diagnosis not present

## 2018-06-14 DIAGNOSIS — I2510 Atherosclerotic heart disease of native coronary artery without angina pectoris: Secondary | ICD-10-CM | POA: Diagnosis not present

## 2018-06-14 DIAGNOSIS — E039 Hypothyroidism, unspecified: Secondary | ICD-10-CM | POA: Diagnosis not present

## 2018-06-14 DIAGNOSIS — H409 Unspecified glaucoma: Secondary | ICD-10-CM | POA: Insufficient documentation

## 2018-06-14 DIAGNOSIS — K449 Diaphragmatic hernia without obstruction or gangrene: Secondary | ICD-10-CM | POA: Diagnosis not present

## 2018-06-14 DIAGNOSIS — Z9013 Acquired absence of bilateral breasts and nipples: Secondary | ICD-10-CM | POA: Diagnosis not present

## 2018-06-14 DIAGNOSIS — I700 Atherosclerosis of aorta: Secondary | ICD-10-CM | POA: Diagnosis not present

## 2018-06-14 DIAGNOSIS — F329 Major depressive disorder, single episode, unspecified: Secondary | ICD-10-CM | POA: Diagnosis not present

## 2018-06-14 DIAGNOSIS — K219 Gastro-esophageal reflux disease without esophagitis: Secondary | ICD-10-CM | POA: Diagnosis not present

## 2018-06-14 DIAGNOSIS — I2699 Other pulmonary embolism without acute cor pulmonale: Secondary | ICD-10-CM

## 2018-06-14 DIAGNOSIS — M1990 Unspecified osteoarthritis, unspecified site: Secondary | ICD-10-CM | POA: Insufficient documentation

## 2018-06-14 LAB — D-DIMER, QUANTITATIVE: D-Dimer, Quant: <0.27 ug/mL-FEU (ref 0.00–0.50)

## 2018-06-14 MED ORDER — FUROSEMIDE 20 MG PO TABS: 10.0000 mg | ORAL_TABLET | Freq: Every day | ORAL | 3 refills | Status: DC | PRN

## 2018-06-14 NOTE — Progress Notes (Addendum)
[facility]  [phone]) [phone] [phone]) [phone]     ID: [name] DOB: [birth year]  MR#: [id number]  LGX#:[id number]  Patient Care Team: Cassandria Anger, MD as PCP - General Arfeen, Arlyce Harman, MD (Psychiatry) Richmond Campbell, MD (Gastroenterology) Fay Records, MD (Cardiology) Johnathan Hausen, MD (General Surgery) Melrose Nakayama, MD as Consulting Physician (Orthopedic Surgery) Larey Dresser, MD as Consulting Physician (Cardiology) Deboraha Sprang, MD as Consulting Physician (Cardiology) Brand Males, MD as Consulting Physician (Pulmonary Disease) Scot Dock, NP OTHER MD:  CHIEF COMPLAINT: unprovoked PE  CURRENT TREATMENT: Eliquis BID   HISTORY OF CURRENT ILLNESS:  [name] is here today of unprovoked pulmonary embolus.  Her story starts in July of 2019 when she started experiencing increased leg pain and swelling.  She was evaluated several times over the next few months for this leg pain.  In October she developed shortness of breath.  CT angio of the chest revealed pulmonary embolus.    She also has h/o breast cancer in 1995 and 2002, and was treated by Dr. Beryle Beams.  She notes the first time she had radiation and took tamoxifen for five years.  The second time she took one cycle of chemotherapy.  She has undergone bilateral mastectomies.    INTERVAL HISTORY: [name] is doing well today.  She is taking Eliquis BID for her pulmonary embolus.  She says she is tolerating it well and denies any easy bruising or bleeding.    REVIEW OF SYSTEMS: [name] denies cough, shortness of breath, chest pain, or palpitations.  She hasn't had nausea, vomiting, bowel/bladder changes.  She has h/o diverticulitis so her last colonoscopy was in 2018.  She is active by doing chores and errands for herself.  A detailed ROS was otherwise non contributory today.    PAST MEDICAL HISTORY: Past Medical History:  Diagnosis Date  . Anxiety   . Bradycardia   . Breast  cancer, left breast ([facility]) 1995  . Breast cancer, right breast ([facility]) 2002  . CHF (congestive heart failure) ([facility])   . Depression    Dr Cheryln Manly  . Discoid lupus    skin  . Full dentures   . GERD (gastroesophageal reflux disease)   . Glaucoma   . Gout   . Hx of echocardiogram    a.  Echocardiogram (03/2011): Mild LVH, EF 81-44%, grade 1 diastolic dysfunction, mild MR, mild to moderate TR, PASP 42;  b. Echo (05/2013):  Mod LVH, EF 65-70%, no WMA, Gr 1 DD, mild MR, mod TR, PASP 39  . Hx of exercise stress test    a. ETT-echocardiogram (06/08/11): Sub-optimal exercise. Test stopped early due to dizziness and hypotension. EF 60%.  Non-diagnostic.;   b. Lexiscan Myoview (06/2013):  Diaphragmatic attenuation, no ischemia, EF 61%.  Low Risk  . Hypertension   . Hypothyroidism   . IBS (irritable bowel syndrome)   . IBS (irritable bowel syndrome)   . Incontinence   . OSA (obstructive sleep apnea) 07/20/2016   Mild with AHI 12/hr now on CPAP  . OSA on CPAP    "quit wearing mask; retested; not enough information; to be tested again 04/08/2016"  . Osteoarthritis   . Osteopenia   . Pacemaker 2009   Brady//Chronotropic incompetence with normal pacemaker function  . Thyroid disease   . Wears hearing aid    both ears    PAST SURGICAL HISTORY: Past Surgical History:  Procedure Laterality Date  . BREAST BIOPSY Left 1995  . BREAST LUMPECTOMY Left  1995   axillary node dissectoin  . CARDIAC CATHETERIZATION N/A 02/13/2016   Procedure: Right Heart Cath;  Surgeon: Larey Dresser, MD;  Location: [facility];  Service: Cardiovascular;  Laterality: N/A;  . CATARACT EXTRACTION W/ INTRAOCULAR LENS  IMPLANT, BILATERAL Bilateral 06/2009 - 12.2915   left - right  . DORSAL COMPARTMENT RELEASE  2000   left  . HAMMER TOE SURGERY Left 2004  . HEMORRHOID BANDING    . INSERT / REPLACE / REMOVE PACEMAKER  2009  . KNEE ARTHROSCOPY Left 08/29/2012   Procedure: LEFT KNEE ARTHROSCOPY ;  Surgeon: Hessie Dibble, MD;  Location: [facility];  Service: Orthopedics;  Laterality: Left;  [name] MASTECTOMY Bilateral 09/2000   nbx  . TRIGGER FINGER RELEASE Right 07/31/2013   Procedure: EXCISE MASS RIGHT INDEX A-1 PLLLEY RELEASE A-1 RIGHT INDEX;  Surgeon: Cammie Sickle., MD;  Location: [facility];  Service: Orthopedics;  Laterality: Right;  [name] VAGINAL HYSTERECTOMY      FAMILY HISTORY Family History  Problem Relation Age of Onset  . Ovarian cancer Mother   . Heart disease Father   . Stroke Sister   . Stroke Brother   . Cancer Brother   . Heart disease Brother     GYNECOLOGIC HISTORY:  No LMP recorded. Patient has had a hysterectomy. Menarche: cannot recall GX P 3 LMP 47 Contraceptive never HRT never  Hysterectomy? yes     SOCIAL HISTORY: [facility].  She is divorced with three children, 2 live in [place], one lives in [place].        ADVANCED DIRECTIVES: In Place.  Her daughter [name] is her HCPOA.     HEALTH MAINTENANCE: Social History   Tobacco Use  . Smoking status: Never Smoker  . Smokeless tobacco: Never Used  Substance Use Topics  . Alcohol use: No    Alcohol/week: 0.0 standard drinks  . Drug use: No     Colonoscopy: 2018    Allergies  Allergen Reactions  . Lactase Diarrhea  . Amlodipine Besy-Benazepril Hcl Swelling  . Amlodipine Besylate Swelling  . Clonidine Hydrochloride     REACTION: tired  . Lactose Intolerance (Gi) Other (See Comments)  . Valsartan Other (See Comments)    Tongue swelling   . Verapamil     Current Outpatient Medications  Medication Sig Dispense Refill  . apixaban (ELIQUIS) 5 MG TABS tablet Take 1 tablet (5 mg total) by mouth 2 (two) times daily. 180 tablet 1  . cetirizine (ZYRTEC) 10 MG tablet Take 10 mg by mouth daily as needed for allergies.    [name] escitalopram (LEXAPRO) 10 MG tablet TAKE 1 TABLET (10 MG) BY MOUTH DAILY AT BEDTIME. 90 tablet 1  . famotidine  (PEPCID) 20 MG tablet Take 20 mg by mouth 2 (two) times daily.  2  . feeding supplement, ENSURE ENLIVE, (ENSURE ENLIVE) LIQD Take 237 mLs by mouth 2 (two) times daily between meals. 237 mL 12  . LORazepam (ATIVAN) 0.5 MG tablet Take 1 tab daily as needed for anxiety 30 tablet 2  . loteprednol (LOTEMAX) 0.5 % ophthalmic suspension Place 1 drop into both eyes daily.    . Magnesium Oxide 200 MG TABS Take 1 tablet (200 mg total) by mouth daily. 90 tablet 3  . Multiple Vitamin (MULTIVITAMIN WITH MINERALS) TABS tablet Take 1 tablet by mouth daily.    . psyllium (METAMUCIL SMOOTH TEXTURE) 28 % packet Take 1 packet by  mouth as needed.     [name] REFRESH TEARS 0.5 % SOLN Place 1 drop into both eyes daily as needed (dry eyes).   0  . spironolactone (ALDACTONE) 25 MG tablet Take 1 tablet (25 mg total) by mouth daily. 90 tablet 3  . SYNTHROID 100 MCG tablet Take 1 tablet (100 mcg total) by mouth daily before breakfast. 30 tablet 11  . tolterodine (DETROL LA) 2 MG 24 hr capsule Take 2 mg by mouth daily.    . furosemide (LASIX) 20 MG tablet Take 0.5-1 tablets (10-20 mg total) by mouth daily as needed. 30 tablet 3   No current facility-administered medications for this visit.     OBJECTIVE:  Vitals:   06/14/18 1052  BP: 117/79  Pulse: 80  Resp: 17  Temp: 97.7 F (36.5 C)  SpO2: 100%     Body mass index is 34.76 kg/m.   Wt Readings from Last 3 Encounters:  06/14/18 178 lb (80.7 kg)  06/12/18 171 lb (77.6 kg)  06/02/18 171 lb (77.6 kg)      ECOG FS:0 - Asymptomatic GENERAL: Patient is a well appearing older woman in no acute distress HEENT:  Sclerae anicteric.  Oropharynx clear and moist. No ulcerations or evidence of oropharyngeal candidiasis. Neck is supple.  NODES:  No cervical, supraclavicular, or axillary lymphadenopathy palpated.  LUNGS:  Clear to auscultation bilaterally.  No wheezes or rhonchi. HEART:  Regular rate and rhythm. No murmur appreciated. ABDOMEN:  Soft, nontender.  Positive,  normoactive bowel sounds. No organomegaly palpated. MSK:  No focal spinal tenderness to palpation. EXTREMITIES:  No peripheral edema.   SKIN:  Clear with no obvious rashes or skin changes. No nail dyscrasia. NEURO:  Nonfocal. Well oriented.  Appropriate affect.     LAB RESULTS:  CMP     Component Value Date/Time   NA 138 03/29/2018 1113   K 4.5 03/29/2018 1113   CL 102 03/29/2018 1113   CO2 29 03/29/2018 1113   GLUCOSE 96 03/29/2018 1113   BUN 31 (H) 03/29/2018 1113   CREATININE 1.18 03/29/2018 1113   CALCIUM 9.3 03/29/2018 1113   PROT 6.6 03/27/2018 1023   ALBUMIN 4.2 11/29/2017 1435   AST 15 11/29/2017 1435   ALT 9 11/29/2017 1435   ALKPHOS 61 11/29/2017 1435   BILITOT 0.7 11/29/2017 1435   GFRNONAA 42 (L) 02/26/2018 1441   GFRAA 48 (L) 02/26/2018 1441    Lab Results  Component Value Date   ALBUMINELP 3.8 03/27/2018   A1GS 0.3 03/27/2018   A2GS 0.7 03/27/2018   BETS 0.4 03/27/2018   BETA2SER 0.4 03/27/2018   GAMS 1.1 03/27/2018   SPEI  03/27/2018     Comment:     Normal Serum Protein Electrophoresis Pattern. No abnormal protein bands (M-protein) detected.     No results found for: Nils Pyle, [facility]  Lab Results  Component Value Date   WBC 4.3 06/12/2018   NEUTROABS 1.8 03/29/2018   HGB 13.7 06/12/2018   HCT 40.6 06/12/2018   MCV 97 06/12/2018   PLT 222 06/12/2018      Chemistry      Component Value Date/Time   NA 138 03/29/2018 1113   K 4.5 03/29/2018 1113   CL 102 03/29/2018 1113   CO2 29 03/29/2018 1113   BUN 31 (H) 03/29/2018 1113   CREATININE 1.18 03/29/2018 1113      Component Value Date/Time   CALCIUM 9.3 03/29/2018 1113   ALKPHOS 61 11/29/2017 1435   AST 15  11/29/2017 1435   ALT 9 11/29/2017 1435   BILITOT 0.7 11/29/2017 1435       No results found for: LABCA2  No components found for: HTDSKA768  No results for input(s): INR in the last 168 hours.  No results found for: LABCA2  No results found for:  TLX726  No results found for: OMB559  No results found for: RCB638  No results found for: CA2729  No components found for: HGQUANT  No results found for: CEA1 / No results found for: CEA1   No results found for: AFPTUMOR  No results found for: CHROMOGRNA  No results found for: PSA1  Appointment on 06/14/2018  Component Date Value Ref Range Status  . D-Dimer, Quant 06/14/2018 <0.27  0.00 - 0.50 ug/mL-FEU Final   Comment: (NOTE) At the manufacturer cut-off of 0.50 ug/mL FEU, this assay has been documented to exclude PE with a sensitivity and negative predictive value of 97 to 99%.  At this time, this assay has not been approved by the FDA to exclude DVT/VTE. Results should be correlated with clinical presentation. Performed at [facility], [place] [address] [place], [place] [address]   Office Visit on 06/12/2018  Component Date Value Ref Range Status  . WBC 06/12/2018 4.3  3.4 - 10.8 x10E3/uL Final  . RBC 06/12/2018 4.17  3.77 - 5.28 x10E6/uL Final  . Hemoglobin 06/12/2018 13.7  11.1 - 15.9 g/dL Final  . Hematocrit 06/12/2018 40.6  34.0 - 46.6 % Final  . MCV 06/12/2018 97  79 - 97 fL Final  . MCH 06/12/2018 32.9  26.6 - 33.0 pg Final  . MCHC 06/12/2018 33.7  31.5 - 35.7 g/dL Final  . RDW 06/12/2018 13.3  11.7 - 15.4 % Final  . Platelets 06/12/2018 222  150 - 450 x10E3/uL Final    (this displays the last labs from the last 3 days)  Lab Results  Component Value Date   ALBUMINELP 3.8 03/27/2018   A1GS 0.3 03/27/2018   A2GS 0.7 03/27/2018   BETS 0.4 03/27/2018   BETA2SER 0.4 03/27/2018   GAMS 1.1 03/27/2018   SPEI  03/27/2018     Comment:     Normal Serum Protein Electrophoresis Pattern. No abnormal protein bands (M-protein) detected.    (this displays SPEP labs)  No results found for: KPAFRELGTCHN, LAMBDASER, KAPLAMBRATIO (kappa/lambda light chains)  No results found for: HGBA, HGBA2QUANT, HGBFQUANT, HGBSQUAN (Hemoglobinopathy evaluation)     Lab Results  Component Value Date   LDH 145 10/30/2008    Lab Results  Component Value Date   IRON 48 10/13/2016   IRONPCTSAT 14.5 (L) 10/13/2016   (Iron and TIBC)  No results found for: FERRITIN  Urinalysis    Component Value Date/Time   COLORURINE YELLOW 02/26/2018 1709   APPEARANCEUR HAZY (A) 02/26/2018 1709   LABSPEC 1.010 02/26/2018 1709   PHURINE 8.5 (H) 02/26/2018 1709   GLUCOSEU NEGATIVE 02/26/2018 1709   GLUCOSEU NEGATIVE 10/10/2015 1642   HGBUR TRACE (A) 02/26/2018 1709   BILIRUBINUR NEGATIVE 02/26/2018 1709   BILIRUBINUR neg 10/13/2016 1511   KETONESUR NEGATIVE 02/26/2018 1709   PROTEINUR NEGATIVE 02/26/2018 1709   UROBILINOGEN 0.2 10/13/2016 1511   UROBILINOGEN 0.2 10/10/2015 1642   NITRITE NEGATIVE 02/26/2018 1709   LEUKOCYTESUR NEGATIVE 02/26/2018 1709     STUDIES: No results found.    ASSESSMENT: 85 y.o. woman with pulmonary embolus diagnosed in 01/2018.  1. CTA on 02/03/2018: Right lower lobar pulmonary embolus, mild cardiomegaly, CAD, aortic atherosclerosis, stable  ectasia of ascending thoracic aorta, cholelithiasis, small hiatal hernia.    2. Started On Eliquis 73m PO BID in 01/2018, tolerating well  3. D dimer on 06/14/2018  4. Lupus anticoagulant testing pending  5. CT abdomen and pelvis pending  PLAN:  EEuginais here today for evaluation of unprovoked pulmonary embolus diagnosed in 01/2018.  She is tolerating the Apixaban well and will continue it.  She is not experiencing any easy bruising/bleeding.  She met with Dr. GLindi Adiewho reviewed her plan which entails D dimer and lupus anti coagulant lab testing, CT abdomen and pelvis.  He discussed rationale behind this testing in detail.   She will see Dr. GLindi Adieback in 3 weeks for f/u to discuss results.   She will call with any problems that may develop before her next visit here.  [name] Bihari NP   06/15/2018 2:26 PM [facility] On[facility] [id number]. [phone]   Fax. [phone]  Attending Note  I personally saw and examined ETruddie [name] The plan of care was discussed with her. I agree with the assessment and plan as documented above. Unprovoked PE: Currently on apixaban.  We would like to perform lupus anticoagulant testing and d-dimer accompanied by CT of the abdomen and pelvis to rule out any malignancy.  We will see her back after the test to discuss the results.  Duration of therapy would depend on findings noted on lab tests. Signed VHarriette Ohara MD

## 2018-06-14 NOTE — Telephone Encounter (Signed)
Gave avs and calendar ° °

## 2018-06-15 ENCOUNTER — Encounter: Payer: Self-pay | Admitting: Adult Health

## 2018-06-15 ENCOUNTER — Telehealth: Payer: Self-pay | Admitting: Hematology and Oncology

## 2018-06-15 NOTE — Telephone Encounter (Signed)
Per sch msg, rescheduled 03/12 to 03/13. Spoke with patient to confirm time and date

## 2018-06-16 ENCOUNTER — Telehealth: Payer: Self-pay | Admitting: Internal Medicine

## 2018-06-16 DIAGNOSIS — I5032 Chronic diastolic (congestive) heart failure: Secondary | ICD-10-CM

## 2018-06-16 LAB — CUP PACEART INCLINIC DEVICE CHECK
Battery Impedance: 4155 Ohm
Battery Remaining Longevity: 12 mo
Brady Statistic AP VP Percent: 4 %
Brady Statistic AP VS Percent: 90 %
Brady Statistic AS VS Percent: 6 %
Implantable Lead Implant Date: 20090527
Implantable Lead Implant Date: 20090527
Implantable Lead Location: 753859
Implantable Lead Location: 753860
Implantable Lead Model: 5076
Implantable Lead Model: 5076
Implantable Pulse Generator Implant Date: 20090527
Lead Channel Impedance Value: 571 Ohm
Lead Channel Pacing Threshold Amplitude: 0.625 V
Lead Channel Pacing Threshold Amplitude: 0.75 V
Lead Channel Pacing Threshold Amplitude: 0.75 V
Lead Channel Pacing Threshold Pulse Width: 0.4 ms
Lead Channel Pacing Threshold Pulse Width: 0.4 ms
Lead Channel Pacing Threshold Pulse Width: 0.4 ms
Lead Channel Pacing Threshold Pulse Width: 0.4 ms
Lead Channel Sensing Intrinsic Amplitude: 4 mV
Lead Channel Setting Pacing Amplitude: 2.5 V
Lead Channel Setting Pacing Pulse Width: 0.4 ms
Lead Channel Setting Sensing Sensitivity: 2 mV
MDC IDC MSMT BATTERY VOLTAGE: 2.69 V
MDC IDC MSMT LEADCHNL RA PACING THRESHOLD AMPLITUDE: 0.625 V
MDC IDC MSMT LEADCHNL RV IMPEDANCE VALUE: 538 Ohm
MDC IDC MSMT LEADCHNL RV SENSING INTR AMPL: 4 mV
MDC IDC SESS DTM: 20200217183105
MDC IDC SET LEADCHNL RA PACING AMPLITUDE: 2 V
MDC IDC STAT BRADY AS VP PERCENT: 0 %

## 2018-06-16 NOTE — Telephone Encounter (Signed)
New message:    Pt says she is spitting up  little drops of dried blood, she is very concerned about this. She is not doing it at this time, it is not all the time.

## 2018-06-16 NOTE — Telephone Encounter (Signed)
Spoke with [name] and she states that for a few weeks she has been coughing up mucus and slightly pink tinged foam with very small, pin size blood spots in it.  Hasn't happened today and has improved over the last week.  Denies CP, states SOB has worsened since last CHF appt and last appt with Caryl Comes.  Denies CP.  Swelling in right leg is unchanged since she had the DVT/PE.  Recent note in Epic from PCP that she could take Furosemide.  She has not started this as it has not been delivered to her yet.  She lives in a facility and pharmacy brings her medication to her.  Advised [name] I would send to CHF clinic for review.  [name] asked that info be sent to Dr. Caryl Comes as well.  Advised I would [place].  [name] appreciative for call.

## 2018-06-17 LAB — DRVVT MIX: dRVVT Mix: 51.9 s — ABNORMAL HIGH (ref 0.0–47.0)

## 2018-06-17 LAB — LUPUS ANTICOAGULANT PANEL
DRVVT: 71.8 s — ABNORMAL HIGH (ref 0.0–47.0)
PTT Lupus Anticoagulant: 35.1 s (ref 0.0–51.9)

## 2018-06-17 LAB — DRVVT CONFIRM: dRVVT Confirm: 1.2 ratio (ref 0.8–1.2)

## 2018-06-18 NOTE — Telephone Encounter (Signed)
Get her in to be seen in APP clinic.  She should get a CXR.

## 2018-06-19 ENCOUNTER — Ambulatory Visit (INDEPENDENT_AMBULATORY_CARE_PROVIDER_SITE_OTHER): Payer: Medicare Other | Admitting: Psychology

## 2018-06-19 DIAGNOSIS — F332 Major depressive disorder, recurrent severe without psychotic features: Secondary | ICD-10-CM

## 2018-06-19 NOTE — Telephone Encounter (Signed)
Spoke w/pt, she states she feels the cough could be from her allergies and she has not been coughing stuff up since last week.  She doesn't want to make an appt at this time, she is sch for an echo on thur 2/27 and will get a chest x-ray while at the hospital for that.  If symptoms return she will let us know

## 2018-06-19 NOTE — Addendum Note (Signed)
Addended by: Scarlette Calico on: 06/19/2018 02:19 PM   Modules accepted: Orders

## 2018-06-19 NOTE — Telephone Encounter (Signed)
LM for patient to return call to office to schedule appointment with NP/PA clinic

## 2018-06-19 NOTE — Telephone Encounter (Signed)
ntoed  thx

## 2018-06-20 ENCOUNTER — Other Ambulatory Visit (HOSPITAL_COMMUNITY): Payer: Self-pay

## 2018-06-21 DIAGNOSIS — J301 Allergic rhinitis due to pollen: Secondary | ICD-10-CM | POA: Diagnosis not present

## 2018-06-21 DIAGNOSIS — J3089 Other allergic rhinitis: Secondary | ICD-10-CM | POA: Diagnosis not present

## 2018-06-22 ENCOUNTER — Ambulatory Visit (HOSPITAL_COMMUNITY)
Admission: RE | Admit: 2018-06-22 | Discharge: 2018-06-22 | Disposition: A | Discharge status: Home / Self Care | Payer: Medicare Other | Source: Ambulatory Visit | Attending: Cardiology | Admitting: Cardiology

## 2018-06-22 ENCOUNTER — Ambulatory Visit (HOSPITAL_BASED_OUTPATIENT_CLINIC_OR_DEPARTMENT_OTHER): Payer: Medicare Other

## 2018-06-22 DIAGNOSIS — I480 Paroxysmal atrial fibrillation: Secondary | ICD-10-CM | POA: Diagnosis not present

## 2018-06-22 DIAGNOSIS — I5032 Chronic diastolic (congestive) heart failure: Secondary | ICD-10-CM | POA: Diagnosis not present

## 2018-06-22 DIAGNOSIS — I495 Sick sinus syndrome: Secondary | ICD-10-CM | POA: Diagnosis not present

## 2018-06-22 DIAGNOSIS — G4733 Obstructive sleep apnea (adult) (pediatric): Secondary | ICD-10-CM | POA: Diagnosis not present

## 2018-06-22 DIAGNOSIS — R05 Cough: Secondary | ICD-10-CM | POA: Diagnosis not present

## 2018-06-22 DIAGNOSIS — Z950 Presence of cardiac pacemaker: Secondary | ICD-10-CM | POA: Insufficient documentation

## 2018-06-23 ENCOUNTER — Telehealth (HOSPITAL_COMMUNITY): Payer: Self-pay

## 2018-06-23 NOTE — Telephone Encounter (Signed)
Relayed info to pt, denies any questions at this time 

## 2018-06-23 NOTE — Telephone Encounter (Signed)
-----   Message from Larey Dresser, MD sent at 06/23/2018  1:36 PM EST ----- No acute abnormality

## 2018-07-05 ENCOUNTER — Other Ambulatory Visit: Payer: Self-pay

## 2018-07-05 ENCOUNTER — Encounter: Payer: Self-pay | Admitting: Internal Medicine

## 2018-07-05 ENCOUNTER — Ambulatory Visit (INDEPENDENT_AMBULATORY_CARE_PROVIDER_SITE_OTHER): Payer: Medicare Other | Admitting: Internal Medicine

## 2018-07-05 VITALS — BP 122/78 | HR 82 | Temp 97.6°F | Ht 60.0 in | Wt 176.0 lb

## 2018-07-05 DIAGNOSIS — I2510 Atherosclerotic heart disease of native coronary artery without angina pectoris: Secondary | ICD-10-CM

## 2018-07-05 DIAGNOSIS — I480 Paroxysmal atrial fibrillation: Secondary | ICD-10-CM

## 2018-07-05 DIAGNOSIS — I2699 Other pulmonary embolism without acute cor pulmonale: Secondary | ICD-10-CM

## 2018-07-05 DIAGNOSIS — I824Z2 Acute embolism and thrombosis of unspecified deep veins of left distal lower extremity: Secondary | ICD-10-CM

## 2018-07-05 DIAGNOSIS — I10 Essential (primary) hypertension: Secondary | ICD-10-CM | POA: Diagnosis not present

## 2018-07-05 NOTE — Patient Instructions (Signed)
The [facility]) does not charge a fee for participation in BMSPAF.  BMSPAF is not affiliated with third-parties who charge a fee for help with applications or medication refills.  Patients are not required to use a third-party who charges a fee for help with applications or medication refills.  These third-parties may reference BMSPAF without permission, and information about BMSPAF on third-party websites may not be accurate.  If you use a third-party who charges a fee to help with your enrollment or refill of your medication, this money is not paid to BMSPAF.   We can assist you with any questions you may have about BMSPAF, free of charge. Our number is [phone].  Eligibility Requirements  If you have been prescribed one of these medicines, you may be eligible for the BMSPAF if you: Do not have insurance coverage for your medicine listed on this site.  Live in the [place], [place] or [place].  Are treated by a [place] licensed prescriber.  Are being treated as an outpatient.  Have a yearly income that is at or below 300% Federal Poverty Level (FPL), $38,280 for a single person or $51,720 for a family size of two. Larger family sizes are adjusted accordingly. Medications that are injected and certain cancer medications may be subject to higher limits.  These are just some of the eligibility requirements. Other eligibility criteria may apply. For more information about eligibility, call the BMSPAF at [phone].

## 2018-07-06 ENCOUNTER — Ambulatory Visit: Payer: Self-pay | Admitting: Hematology and Oncology

## 2018-07-06 ENCOUNTER — Ambulatory Visit (INDEPENDENT_AMBULATORY_CARE_PROVIDER_SITE_OTHER): Payer: Medicare Other | Admitting: *Deleted

## 2018-07-06 DIAGNOSIS — I495 Sick sinus syndrome: Secondary | ICD-10-CM

## 2018-07-06 NOTE — Progress Notes (Signed)
Patient Care Team: Plotnikov, Evie Lacks, MD as PCP - General Arfeen, Arlyce Harman, MD (Psychiatry) Richmond Campbell, MD (Gastroenterology) Fay Records, MD (Cardiology) Johnathan Hausen, MD (General Surgery) Melrose Nakayama, MD as Consulting Physician (Orthopedic Surgery) Larey Dresser, MD as Consulting Physician (Cardiology) Deboraha Sprang, MD as Consulting Physician (Cardiology) Brand Males, MD as Consulting Physician (Pulmonary Disease)  DIAGNOSIS:    ICD-10-CM   1. PE (pulmonary thromboembolism) (HCC) I26.99     CHIEF COMPLIANT: Follow-up of PE to discuss recent labs and scans  INTERVAL HISTORY: [name] is a 85 y.o. with above-mentioned history of breast cancer in 1995 and 2002, in addition to unprovoked PE for which she is on Eliquis. She was last seen by Wilber Bihari, NP on 06/14/18. Her lab work from 06/14/18 showed no evidence of lupus antocoagulant, d-dimer <0.27. She presents to the clinic today to review the results of blood work.  She does not have any bleeding problems from anticoagulation.  She is tolerating it very well.  REVIEW OF SYSTEMS:   Constitutional: Denies fevers, chills or abnormal weight loss Eyes: Denies blurriness of vision Ears, nose, mouth, throat, and face: Denies mucositis or sore throat Respiratory: Denies cough, dyspnea or wheezes Cardiovascular: Denies palpitation, chest discomfort Gastrointestinal: Denies nausea, heartburn or change in bowel habits Skin: Denies abnormal skin rashes Lymphatics: Denies new lymphadenopathy or easy bruising Neurological: Denies numbness, tingling or new weaknesses Behavioral/Psych: Mood is stable, no new changes  Extremities: No lower extremity edema Breast: denies any pain or lumps or nodules in either breasts All other systems were reviewed with the patient and are negative.  I have reviewed the past medical history, past surgical history, social history and family history with the patient and they are  unchanged from previous note.  ALLERGIES:  is allergic to lactase; amlodipine besy-benazepril hcl; amlodipine besylate; clonidine hydrochloride; lactose intolerance (gi); valsartan; and verapamil.  MEDICATIONS:  Current Outpatient Medications  Medication Sig Dispense Refill  . apixaban (ELIQUIS) 5 MG TABS tablet Take 1 tablet (5 mg total) by mouth 2 (two) times daily. 180 tablet 1  . cetirizine (ZYRTEC) 10 MG tablet Take 10 mg by mouth daily as needed for allergies.    [name] escitalopram (LEXAPRO) 10 MG tablet TAKE 1 TABLET (10 MG) BY MOUTH DAILY AT BEDTIME. 90 tablet 1  . famotidine (PEPCID) 20 MG tablet Take 20 mg by mouth 2 (two) times daily.  2  . feeding supplement, ENSURE ENLIVE, (ENSURE ENLIVE) LIQD Take 237 mLs by mouth 2 (two) times daily between meals. 237 mL 12  . furosemide (LASIX) 20 MG tablet Take 0.5-1 tablets (10-20 mg total) by mouth daily as needed. 30 tablet 3  . LORazepam (ATIVAN) 0.5 MG tablet Take 1 tab daily as needed for anxiety 30 tablet 2  . loteprednol (LOTEMAX) 0.5 % ophthalmic suspension Place 1 drop into both eyes daily.    . Magnesium Oxide 200 MG TABS Take 1 tablet (200 mg total) by mouth daily. 90 tablet 3  . Multiple Vitamin (MULTIVITAMIN WITH MINERALS) TABS tablet Take 1 tablet by mouth daily.    . psyllium (METAMUCIL SMOOTH TEXTURE) 28 % packet Take 1 packet by mouth as needed.     [name] REFRESH TEARS 0.5 % SOLN Place 1 drop into both eyes daily as needed (dry eyes).   0  . spironolactone (ALDACTONE) 25 MG tablet Take 1 tablet (25 mg total) by mouth daily. 90 tablet 3  . SYNTHROID 100 MCG tablet Take 1 tablet (  100 mcg total) by mouth daily before breakfast. 30 tablet 11  . tolterodine (DETROL LA) 2 MG 24 hr capsule Take 2 mg by mouth daily.     No current facility-administered medications for this visit.     PHYSICAL EXAMINATION: ECOG PERFORMANCE STATUS: 1 - Symptomatic but completely ambulatory  Vitals:   07/07/18 1054  BP: 104/66  Pulse: 80  Resp: 18   Temp: 97.9 F (36.6 C)  SpO2: 100%   Filed Weights   07/07/18 1054  Weight: 174 lb 12.8 oz (79.3 kg)    GENERAL: alert, no distress and comfortable SKIN: skin color, texture, turgor are normal, no rashes or significant lesions EYES: normal, Conjunctiva are pink and non-injected, sclera clear OROPHARYNX: no exudate, no erythema and lips, buccal mucosa, and tongue normal  NECK: supple, thyroid normal size, non-tender, without nodularity LYMPH: no palpable lymphadenopathy in the cervical, axillary or inguinal LUNGS: clear to auscultation and percussion with normal breathing effort HEART: regular rate & rhythm and no murmurs and no lower extremity edema ABDOMEN: abdomen soft, non-tender and normal bowel sounds MUSCULOSKELETAL: no cyanosis of digits and no clubbing  NEURO: alert & oriented x 3 with fluent speech, no focal motor/sensory deficits EXTREMITIES: No lower extremity edema   LABORATORY DATA:  I have reviewed the data as listed CMP Latest Ref Rng & Units 03/29/2018 03/27/2018 02/26/2018  Glucose 70 - 99 mg/dL 96 - 99  BUN 6 - 23 mg/dL 31(H) - 25(H)  Creatinine 0.40 - 1.20 mg/dL 1.18 - 1.15(H)  Sodium 135 - 145 mEq/L 138 - 135  Potassium 3.5 - 5.1 mEq/L 4.5 - 4.9  Chloride 96 - 112 mEq/L 102 - 101  CO2 19 - 32 mEq/L 29 - 26  Calcium 8.4 - 10.5 mg/dL 9.3 - 9.3  Total Protein 6.1 - 8.1 g/dL - 6.6 -  Total Bilirubin 0.2 - 1.2 mg/dL - - -  Alkaline Phos 39 - 117 U/L - - -  AST 0 - 37 U/L - - -  ALT 0 - 35 U/L - - -    Lab Results  Component Value Date   WBC 4.3 06/12/2018   HGB 13.7 06/12/2018   HCT 40.6 06/12/2018   MCV 97 06/12/2018   PLT 222 06/12/2018   NEUTROABS 1.8 03/29/2018    ASSESSMENT & PLAN:  PE (pulmonary thromboembolism) ([place]) 85 y.o. woman with pulmonary embolus diagnosed in 01/2018.  1. CTA on 02/03/2018: Right lower lobar pulmonary embolus, mild cardiomegaly, CAD, aortic atherosclerosis, stable ectasia of ascending thoracic aorta, cholelithiasis,  small hiatal hernia.   2. Started On Eliquis 5mg  PO BID in 01/2018, tolerating well 3. D dimer on 06/14/2018: Less than 0.27 4. Lupus anticoagulant testing: No lupus anticoagulant detected 5. CT abdomen and pelvis will need to be scheduled.  I will call her with the result of the CT scan.  Shortness of breath: Follows with cardiology.  Duration of anticoagulation: 6 months Return to clinic on an as-needed basis.    No orders of the defined types were placed in this encounter.  The patient has a good understanding of the overall plan. she agrees with it. she will call with any problems that may develop before the next visit here.  Nicholas Lose, MD 07/07/2018  Julious Oka Dorshimer am acting as scribe for Dr. Nicholas Lose.  I have reviewed the above documentation for accuracy and completeness, and I agree with the above.

## 2018-07-07 ENCOUNTER — Encounter: Payer: Self-pay | Admitting: *Deleted

## 2018-07-07 ENCOUNTER — Inpatient Hospital Stay: Payer: Medicare Other

## 2018-07-07 ENCOUNTER — Telehealth: Payer: Self-pay | Admitting: *Deleted

## 2018-07-07 ENCOUNTER — Inpatient Hospital Stay: Payer: Medicare Other | Attending: Adult Health | Admitting: Hematology and Oncology

## 2018-07-07 ENCOUNTER — Other Ambulatory Visit: Payer: Self-pay

## 2018-07-07 ENCOUNTER — Other Ambulatory Visit: Payer: Self-pay | Admitting: *Deleted

## 2018-07-07 DIAGNOSIS — I2510 Atherosclerotic heart disease of native coronary artery without angina pectoris: Secondary | ICD-10-CM | POA: Insufficient documentation

## 2018-07-07 DIAGNOSIS — I700 Atherosclerosis of aorta: Secondary | ICD-10-CM | POA: Diagnosis not present

## 2018-07-07 DIAGNOSIS — Z79899 Other long term (current) drug therapy: Secondary | ICD-10-CM | POA: Diagnosis not present

## 2018-07-07 DIAGNOSIS — Z86711 Personal history of pulmonary embolism: Secondary | ICD-10-CM | POA: Diagnosis not present

## 2018-07-07 DIAGNOSIS — I2699 Other pulmonary embolism without acute cor pulmonale: Secondary | ICD-10-CM | POA: Insufficient documentation

## 2018-07-07 DIAGNOSIS — K449 Diaphragmatic hernia without obstruction or gangrene: Secondary | ICD-10-CM | POA: Diagnosis not present

## 2018-07-07 DIAGNOSIS — Z7901 Long term (current) use of anticoagulants: Secondary | ICD-10-CM

## 2018-07-07 DIAGNOSIS — C9002 Multiple myeloma in relapse: Secondary | ICD-10-CM

## 2018-07-07 LAB — CUP PACEART REMOTE DEVICE CHECK
Battery Impedance: 4751 Ohm
Battery Voltage: 2.67 V
Brady Statistic AP VP Percent: 5 %
Brady Statistic AP VS Percent: 87 %
Brady Statistic AS VP Percent: 0 %
Brady Statistic AS VS Percent: 7 %
Implantable Lead Implant Date: 20090527
Implantable Lead Implant Date: 20090527
Implantable Lead Location: 753859
Implantable Lead Location: 753860
Implantable Lead Model: 5076
Implantable Lead Model: 5076
Implantable Pulse Generator Implant Date: 20090527
Lead Channel Impedance Value: 542 Ohm
Lead Channel Impedance Value: 554 Ohm
Lead Channel Pacing Threshold Amplitude: 0.625 V
Lead Channel Pacing Threshold Amplitude: 0.75 V
Lead Channel Pacing Threshold Pulse Width: 0.4 ms
Lead Channel Pacing Threshold Pulse Width: 0.4 ms
Lead Channel Setting Pacing Amplitude: 2.5 V
Lead Channel Setting Pacing Pulse Width: 0.4 ms
MDC IDC MSMT BATTERY REMAINING LONGEVITY: 9 mo
MDC IDC SESS DTM: 20200312144852
MDC IDC SET LEADCHNL RA PACING AMPLITUDE: 2 V
MDC IDC SET LEADCHNL RV SENSING SENSITIVITY: 2.8 mV

## 2018-07-07 LAB — CMP (CANCER CENTER ONLY)
ALT: 10 U/L (ref 0–44)
AST: 18 U/L (ref 15–41)
Albumin: 4.1 g/dL (ref 3.5–5.0)
Alkaline Phosphatase: 78 U/L (ref 38–126)
Anion gap: 10 (ref 5–15)
BUN: 25 mg/dL — ABNORMAL HIGH (ref 8–23)
CO2: 26 mmol/L (ref 22–32)
Calcium: 9.7 mg/dL (ref 8.9–10.3)
Chloride: 104 mmol/L (ref 98–111)
Creatinine: 1.27 mg/dL — ABNORMAL HIGH (ref 0.44–1.00)
GFR, EST NON AFRICAN AMERICAN: 38 mL/min — AB (ref >60)
GFR, Est AFR Am: 44 mL/min — ABNORMAL LOW (ref >60)
Glucose, Bld: 84 mg/dL (ref 70–99)
Potassium: 4.6 mmol/L (ref 3.5–5.1)
Sodium: 140 mmol/L (ref 135–145)
Total Bilirubin: 0.8 mg/dL (ref 0.3–1.2)
Total Protein: 7.6 g/dL (ref 6.5–8.1)

## 2018-07-07 NOTE — Telephone Encounter (Signed)
Eliquis instructions addressed per phone note from Merleen Nicely, [place], at Dr. Geralyn Flash office. Encounter closed.

## 2018-07-07 NOTE — Assessment & Plan Note (Signed)
85 y.o. woman with pulmonary embolus diagnosed in 01/2018.  1. CTA on 02/03/2018: Right lower lobar pulmonary embolus, mild cardiomegaly, CAD, aortic atherosclerosis, stable ectasia of ascending thoracic aorta, cholelithiasis, small hiatal hernia.   2. Started On Eliquis 5mg  PO BID in 01/2018, tolerating well 3. D dimer on 06/14/2018: Less than 0.27 4. Lupus anticoagulant testing: No lupus anticoagulant detected 5. CT abdomen and pelvis pending  Duration of anticoagulation: 1 year Return to clinic in 6 months for follow-up.

## 2018-07-07 NOTE — Telephone Encounter (Signed)
Returned pt call regarding if she should stop taking her elaquis.  Pt stated that she thought Dr. Lindi Adie had told her to stop taking it for 6 months.  Per Dr. Geralyn Flash office visit note from today 07/07/2018, pt to continue taking elaquis and to follow up in 6 months.  Attempted to call pt back,unable to reach pt and left voice mail stating she needed to continue taking the elaquis and there was no indication in Dr. Geralyn Flash note from today to stop it.

## 2018-07-07 NOTE — Progress Notes (Signed)
Scheduled pt for CT of abdomen/pelvis on 07/12/2018 at 4pm.  Pt in lobby of cancer center and pt notified of CT apt and time.  Pt also given 2 bottles of contrast with instructions when to take them.  Pt verbalized understanding.  Pt also states that she would need help with a ride to and from on 07/12/2018.  Lauralee Evener notified that pt would need to be picked up at her on 07/12/2018 at 3 pm and then picked up at the cancer center on 07/12/2018 at 5:30 pm.  Deatra Canter stated she would get the transport set up and would notify the patient regarding transport schedule.

## 2018-07-07 NOTE — Telephone Encounter (Signed)
Spoke with patient regarding PPM nearing ERI, estimated 9 months remaining (range: <1-20 months). Pt is agreeable to beginning monthly battery checks with next transmission scheduled for 08/07/18.  Pt requests clarification regarding stopping her Eliquis. She reports that at her appointment with Dr. Lindi Adie for her PE this morning, she was told to finish up her current supply of Eliquis (~6 more weeks worth), then she can stop taking it. Patient reports she wants Dr. Olin Pia recommendations as she has a history of PAF as well. Advised current transmission shows a single episode of A-fib on 06/22/18, duration 1hr 14min, burden 0.3%. Advised patient I will send Dr. Caryl Comes and Rolanda Lundborg, RN, a message.  After hanging up from call, reviewed Dr. Geralyn Flash note, which doesn't clearly mention stopping Eliquis in 6 weeks. Spoke with patient, encouraged her to contact Dr. Geralyn Flash office for further clarification. She verbalizes understanding and thanked me for my call.

## 2018-07-10 NOTE — Assessment & Plan Note (Signed)
Eliquis No angina

## 2018-07-10 NOTE — Assessment & Plan Note (Signed)
On Eliquis

## 2018-07-10 NOTE — Assessment & Plan Note (Signed)
Spironolactone

## 2018-07-10 NOTE — Assessment & Plan Note (Signed)
H/o RLL PE 10/19; LLE DVT On Eliquis

## 2018-07-10 NOTE — Progress Notes (Signed)
Subjective:  Patient ID: [name], female    DOB: 08-21-1929  Age: 85 y.o. MRN: [id number]  CC: No chief complaint on file.   HPI [name] presents for DVT, anxiety, HTN f/u  Outpatient Medications Prior to Visit  Medication Sig Dispense Refill  . apixaban (ELIQUIS) 5 MG TABS tablet Take 1 tablet (5 mg total) by mouth 2 (two) times daily. 180 tablet 1  . cetirizine (ZYRTEC) 10 MG tablet Take 10 mg by mouth daily as needed for allergies.    [name] escitalopram (LEXAPRO) 10 MG tablet TAKE 1 TABLET (10 MG) BY MOUTH DAILY AT BEDTIME. 90 tablet 1  . famotidine (PEPCID) 20 MG tablet Take 20 mg by mouth 2 (two) times daily.  2  . feeding supplement, ENSURE ENLIVE, (ENSURE ENLIVE) LIQD Take 237 mLs by mouth 2 (two) times daily between meals. 237 mL 12  . furosemide (LASIX) 20 MG tablet Take 0.5-1 tablets (10-20 mg total) by mouth daily as needed. 30 tablet 3  . LORazepam (ATIVAN) 0.5 MG tablet Take 1 tab daily as needed for anxiety 30 tablet 2  . loteprednol (LOTEMAX) 0.5 % ophthalmic suspension Place 1 drop into both eyes daily.    . Magnesium Oxide 200 MG TABS Take 1 tablet (200 mg total) by mouth daily. 90 tablet 3  . Multiple Vitamin (MULTIVITAMIN WITH MINERALS) TABS tablet Take 1 tablet by mouth daily.    . psyllium (METAMUCIL SMOOTH TEXTURE) 28 % packet Take 1 packet by mouth as needed.     [name] REFRESH TEARS 0.5 % SOLN Place 1 drop into both eyes daily as needed (dry eyes).   0  . spironolactone (ALDACTONE) 25 MG tablet Take 1 tablet (25 mg total) by mouth daily. 90 tablet 3  . SYNTHROID 100 MCG tablet Take 1 tablet (100 mcg total) by mouth daily before breakfast. 30 tablet 11  . tolterodine (DETROL LA) 2 MG 24 hr capsule Take 2 mg by mouth daily.     No facility-administered medications prior to visit.     ROS: Review of Systems  Constitutional: Positive for fatigue. Negative for activity change, appetite change, chills and unexpected weight change.  HENT: Negative for congestion,  mouth sores and sinus pressure.   Eyes: Negative for visual disturbance.  Respiratory: Negative for cough and chest tightness.   Gastrointestinal: Negative for abdominal pain and nausea.  Genitourinary: Negative for difficulty urinating, frequency and vaginal pain.  Musculoskeletal: Negative for back pain and gait problem.  Skin: Negative for pallor and rash.  Neurological: Positive for weakness. Negative for dizziness, tremors, numbness and headaches.  Psychiatric/Behavioral: Negative for confusion, sleep disturbance and suicidal ideas. The patient is nervous/anxious.     Objective:  BP 122/78 (BP Location: Right Arm, Patient Position: Sitting, Cuff Size: Normal)   Pulse 82   Temp 97.6 F (36.4 C) (Oral)   Ht 5' (1.524 m)   Wt 176 lb (79.8 kg)   SpO2 98%   BMI 34.37 kg/m   BP Readings from Last 3 Encounters:  07/07/18 104/66  07/05/18 122/78  06/14/18 117/79    Wt Readings from Last 3 Encounters:  07/07/18 174 lb 12.8 oz (79.3 kg)  07/05/18 176 lb (79.8 kg)  06/14/18 178 lb (80.7 kg)    Physical Exam Constitutional:      General: She is not in acute distress.    Appearance: She is well-developed.  HENT:     Head: Normocephalic.     Right Ear: External ear normal.  Left Ear: External ear normal.     Nose: Nose normal.  Eyes:     General:        Right eye: No discharge.        Left eye: No discharge.     Conjunctiva/sclera: Conjunctivae normal.     Pupils: Pupils are equal, round, and reactive to light.  Neck:     Musculoskeletal: Normal range of motion and neck supple.     Thyroid: No thyromegaly.     Vascular: No JVD.     Trachea: No tracheal deviation.  Cardiovascular:     Rate and Rhythm: Normal rate and regular rhythm.     Heart sounds: Normal heart sounds.  Pulmonary:     Effort: No respiratory distress.     Breath sounds: No stridor. No wheezing.  Abdominal:     General: Bowel sounds are normal. There is no distension.     Palpations: Abdomen is  soft. There is no mass.     Tenderness: There is no abdominal tenderness. There is no guarding or rebound.  Musculoskeletal:        General: No tenderness.  Lymphadenopathy:     Cervical: No cervical adenopathy.  Skin:    Findings: No erythema or rash.  Neurological:     Cranial Nerves: No cranial nerve deficit.     Motor: Weakness present. No abnormal muscle tone.     Coordination: Coordination normal.     Gait: Gait abnormal.     Deep Tendon Reflexes: Reflexes normal.  Psychiatric:        Behavior: Behavior normal.        Thought Content: Thought content normal.        Judgment: Judgment normal.   obese Arthritic gait anxious  Lab Results  Component Value Date   WBC 4.3 06/12/2018   HGB 13.7 06/12/2018   HCT 40.6 06/12/2018   PLT 222 06/12/2018   GLUCOSE 84 07/07/2018   CHOL 174 05/11/2010   TRIG 78.0 05/11/2010   HDL 52.30 05/11/2010   LDLDIRECT 130.8 09/30/2006   LDLCALC 106 (H) 05/11/2010   ALT 10 07/07/2018   AST 18 07/07/2018   NA 140 07/07/2018   K 4.6 07/07/2018   CL 104 07/07/2018   CREATININE 1.27 (H) 07/07/2018   BUN 25 (H) 07/07/2018   CO2 26 07/07/2018   TSH 0.74 02/01/2018   INR 1.08 06/30/2016   HGBA1C 6.0 04/10/2012    Dg Chest 2 View  Result Date: 06/23/2018 CLINICAL DATA:  Productive cough. EXAM: CHEST - 2 VIEW COMPARISON:  Chest radiographs dated 02/16/2017 and chest CTA dated 02/03/2018. FINDINGS: Normal sized heart. Tortuous aorta with arch calcifications. Stable right subclavian pacemaker leads. No significant change in a right diaphragmatic eventration. Clear lungs. Unremarkable bones. Left axillary surgical clips. IMPRESSION: No acute abnormality. Electronically Signed   By: Claudie Revering M.D.   On: 06/23/2018 09:02    Assessment & Plan:   There are no diagnoses linked to this encounter.   No orders of the defined types were placed in this encounter.    Follow-up: Return in about 6 months (around 01/05/2019) for a follow-up visit.   Walker Kehr, MD

## 2018-07-11 ENCOUNTER — Telehealth: Payer: Self-pay | Admitting: Hematology and Oncology

## 2018-07-11 NOTE — Telephone Encounter (Signed)
Spoke with patient on Friday regarding transportation.   Patient stated her facility will bring her tomorrow and her daughter will pick her up.

## 2018-07-12 ENCOUNTER — Telehealth: Payer: Self-pay

## 2018-07-12 ENCOUNTER — Other Ambulatory Visit: Payer: Self-pay

## 2018-07-12 ENCOUNTER — Ambulatory Visit (HOSPITAL_COMMUNITY)
Admission: RE | Admit: 2018-07-12 | Discharge: 2018-07-12 | Disposition: A | Discharge status: Home / Self Care | Payer: Medicare Other | Source: Ambulatory Visit | Attending: Adult Health | Admitting: Adult Health

## 2018-07-12 DIAGNOSIS — R109 Unspecified abdominal pain: Secondary | ICD-10-CM | POA: Diagnosis not present

## 2018-07-12 DIAGNOSIS — I824Z2 Acute embolism and thrombosis of unspecified deep veins of left distal lower extremity: Secondary | ICD-10-CM | POA: Insufficient documentation

## 2018-07-12 DIAGNOSIS — Z853 Personal history of malignant neoplasm of breast: Secondary | ICD-10-CM | POA: Diagnosis not present

## 2018-07-12 DIAGNOSIS — R1084 Generalized abdominal pain: Secondary | ICD-10-CM

## 2018-07-12 DIAGNOSIS — I2699 Other pulmonary embolism without acute cor pulmonale: Secondary | ICD-10-CM | POA: Diagnosis not present

## 2018-07-12 MED ORDER — SODIUM CHLORIDE (PF) 0.9 % IJ SOLN
INTRAMUSCULAR | Status: AC
  Filled 2018-07-12: qty 50

## 2018-07-12 MED ORDER — IOHEXOL 300 MG/ML  SOLN
80.0000 mL | Freq: Once | INTRAMUSCULAR | Status: AC | PRN
  Administered 2018-07-12: 80 mL via INTRAVENOUS

## 2018-07-12 NOTE — Telephone Encounter (Signed)
Received call from CT that pt has completed scans and is ready to be sent home. Pt was told that the cancer center would set up transportation for her.  Called and spoke with transport coordinator to set up [name] for pt pick up at the cancer center lobby. Valet parking staff notified to look out for uber car. Pt daughter called and notified of plan for transport. Pt aware.

## 2018-07-13 ENCOUNTER — Ambulatory Visit: Payer: Self-pay | Admitting: Internal Medicine

## 2018-07-13 ENCOUNTER — Telehealth: Payer: Self-pay | Admitting: *Deleted

## 2018-07-13 ENCOUNTER — Encounter: Payer: Self-pay | Admitting: Cardiology

## 2018-07-13 DIAGNOSIS — G4733 Obstructive sleep apnea (adult) (pediatric): Secondary | ICD-10-CM

## 2018-07-13 NOTE — Progress Notes (Signed)
Remote pacemaker transmission.   

## 2018-07-13 NOTE — Telephone Encounter (Signed)
Chin strap ordered placed today.

## 2018-07-13 NOTE — Telephone Encounter (Signed)
-----   Message from Sueanne Margarita, MD sent at 07/12/2018  4:53 PM EDT ----- AHI too high - please find out if she is sleeping on her back and what mask she is wearing.  Also find out why she has not been compliant

## 2018-07-13 NOTE — Telephone Encounter (Signed)
Please order a chin strap as well and get a download in 4 weeks

## 2018-07-13 NOTE — Telephone Encounter (Signed)
Return pt call requesting results from CT Abdomen and Pelvis.  Per Dr. Lindi Adie, results are normal.  A small kidney stone was noted but nothing to worry about.  Dr. Lindi Adie suggests the pt follow up with a GI doctor to address the abdominal pain she is experiencing considering the CT was normal.  Explained all of this to the pt and she states she has a history of IBS and was not too worried about it but would follow up with a GI doctor if she needed to.

## 2018-07-13 NOTE — Telephone Encounter (Signed)
Patient is aware and agreeable to AHI being at a high range of 12.2. Patient states she does not sleep on her back but on her side.Patient states she has a Dream wear nasal mask. Patient states she has not been compliant due to the fact that she can not figure out how to put her new mask on.  Patient will make an appointment with [facility] to get help.

## 2018-07-17 ENCOUNTER — Telehealth: Payer: Self-pay | Admitting: Cardiology

## 2018-07-17 ENCOUNTER — Telehealth: Payer: Self-pay | Admitting: *Deleted

## 2018-07-17 ENCOUNTER — Ambulatory Visit (INDEPENDENT_AMBULATORY_CARE_PROVIDER_SITE_OTHER): Payer: Medicare Other | Admitting: Psychology

## 2018-07-17 ENCOUNTER — Telehealth: Payer: Self-pay | Admitting: Internal Medicine

## 2018-07-17 DIAGNOSIS — F332 Major depressive disorder, recurrent severe without psychotic features: Secondary | ICD-10-CM

## 2018-07-17 NOTE — Telephone Encounter (Signed)
Pt calling to determine if Dr Caryl Comes would like her to stay on her anticoagulant. Pt was placed on NOAC for DVT and PE by her PCP and hematologist. She states her hematologist told her she could come off her Eliqus. I advised her to check with his office. Since she was placed on NOAC for PE/DVT, Dr Caryl Comes would not recommend whether she may come off. She has verbalized understanding and had no additional questions.

## 2018-07-17 NOTE — Telephone Encounter (Signed)
Received pt call stating she had stopped taking her blood thinner.  Instructed pt that I left a voice mail for her on 07/07/2018 stating that per Dr. Geralyn Flash office visit not from that day, pt needed to continue taking her blood thinner.  Pt stated she had forgot and would go to the pharmacy to pick it up and to continue taking it.  I instructed pt that is was very important to continue taking the blood thinner even though the clot is gone, we are wanting to prevent any further clots from forming.  Pt verbalizes understanding.

## 2018-07-17 NOTE — Telephone Encounter (Signed)
  Patient states that she got a new mask and she does not know how to fit it and she hasn't gotten a response from [facility]. Patient states that she wants Dr Radford Pax to know that she is still wearing her old mask for now.

## 2018-07-17 NOTE — Telephone Encounter (Signed)
° ° °  1. Are you currently SOB (can you hear that pt is SOB on the phone)? no  2. How long have you been experiencing SOB? n/a  3. Are you SOB when sitting or when up moving around? Sitting and moving  4. Are you currently experiencing any other symptoms? No    Pt c/o medication issue:  1. Name of Medication: apixaban (ELIQUIS) 5 MG TABS tablet  2. How are you currently taking this medication (dosage and times per day)? As written  3. Are you having a reaction (difficulty breathing--STAT)? no  4. What is your medication issue? Patient states her Hematologist is taking her off Eliquis

## 2018-07-18 NOTE — Telephone Encounter (Signed)
Reached out to patient and let her know that I will contact [facility] on her behalf about her new mask on Wednesday and give her a call back. Pt is aware and agreeable to treatment.

## 2018-07-19 DIAGNOSIS — J301 Allergic rhinitis due to pollen: Secondary | ICD-10-CM | POA: Diagnosis not present

## 2018-07-19 DIAGNOSIS — J3089 Other allergic rhinitis: Secondary | ICD-10-CM | POA: Diagnosis not present

## 2018-07-21 ENCOUNTER — Telehealth: Payer: Self-pay

## 2018-07-21 NOTE — Telephone Encounter (Signed)
-----   Message from Gardenia Phlegm, NP sent at 07/21/2018  8:43 AM EDT ----- No cancer noted on scan, please notify patient ----- Message ----- From: Interface, Rad Results In Sent: 07/12/2018   9:46 PM EDT To: Gardenia Phlegm, NP

## 2018-07-21 NOTE — Telephone Encounter (Signed)
Spoke with patient to inform that there was no cancer noted on scan.  Patient very happy and thanked nurse for call.

## 2018-07-21 NOTE — Telephone Encounter (Signed)
Reached out to [facility] and asked them to contact patient concerning help with applying her new mask. I asked [facility] to please call the patient as she states she cannot get through to anyone at that facility. [facility] will send an email to the RT dept to contact the patient. I will follow up with the patient as well.

## 2018-08-07 ENCOUNTER — Telehealth: Payer: Self-pay | Admitting: Internal Medicine

## 2018-08-07 ENCOUNTER — Other Ambulatory Visit: Payer: Self-pay

## 2018-08-07 ENCOUNTER — Ambulatory Visit (INDEPENDENT_AMBULATORY_CARE_PROVIDER_SITE_OTHER): Payer: Medicare Other | Admitting: *Deleted

## 2018-08-07 DIAGNOSIS — I5032 Chronic diastolic (congestive) heart failure: Secondary | ICD-10-CM

## 2018-08-07 DIAGNOSIS — I495 Sick sinus syndrome: Secondary | ICD-10-CM

## 2018-08-07 LAB — CUP PACEART REMOTE DEVICE CHECK
Battery Impedance: 4918 Ohm
Battery Remaining Longevity: 8 mo
Battery Voltage: 2.69 V
Brady Statistic AP VP Percent: 3 %
Brady Statistic AP VS Percent: 89 %
Brady Statistic AS VP Percent: 0 %
Brady Statistic AS VS Percent: 7 %
Date Time Interrogation Session: 20200413145707
Implantable Lead Implant Date: 20090527
Implantable Lead Implant Date: 20090527
Implantable Lead Location: 753859
Implantable Lead Location: 753860
Implantable Lead Model: 5076
Implantable Lead Model: 5076
Implantable Pulse Generator Implant Date: 20090527
Lead Channel Impedance Value: 552 Ohm
Lead Channel Impedance Value: 562 Ohm
Lead Channel Pacing Threshold Amplitude: 0.625 V
Lead Channel Pacing Threshold Amplitude: 0.625 V
Lead Channel Pacing Threshold Pulse Width: 0.4 ms
Lead Channel Pacing Threshold Pulse Width: 0.4 ms
Lead Channel Setting Pacing Amplitude: 2 V
Lead Channel Setting Pacing Amplitude: 2.5 V
Lead Channel Setting Pacing Pulse Width: 0.4 ms
Lead Channel Setting Sensing Sensitivity: 2 mV

## 2018-08-07 NOTE — Telephone Encounter (Signed)
New Message            Patient is having trouble with device. Patient would like a call back to get help with her device.

## 2018-08-07 NOTE — Telephone Encounter (Signed)
Patient stated that she figured out that her monitor need batteries. She replaced the batteries and sent the transmission. Transmission received.

## 2018-08-14 ENCOUNTER — Telehealth: Payer: Self-pay | Admitting: Internal Medicine

## 2018-08-14 ENCOUNTER — Ambulatory Visit: Payer: Medicare Other | Admitting: Psychology

## 2018-08-14 NOTE — Telephone Encounter (Signed)
Called patient to schedule AWV; no answer, could not leave voicemail.  SF

## 2018-08-15 NOTE — Progress Notes (Signed)
Remote pacemaker transmission.   

## 2018-08-16 ENCOUNTER — Other Ambulatory Visit: Payer: Self-pay | Admitting: Internal Medicine

## 2018-08-17 DIAGNOSIS — J301 Allergic rhinitis due to pollen: Secondary | ICD-10-CM | POA: Diagnosis not present

## 2018-08-17 DIAGNOSIS — J3089 Other allergic rhinitis: Secondary | ICD-10-CM | POA: Diagnosis not present

## 2018-08-30 DIAGNOSIS — J3089 Other allergic rhinitis: Secondary | ICD-10-CM | POA: Diagnosis not present

## 2018-08-30 DIAGNOSIS — J301 Allergic rhinitis due to pollen: Secondary | ICD-10-CM | POA: Diagnosis not present

## 2018-09-06 ENCOUNTER — Ambulatory Visit (INDEPENDENT_AMBULATORY_CARE_PROVIDER_SITE_OTHER): Payer: Medicare Other | Admitting: *Deleted

## 2018-09-06 ENCOUNTER — Other Ambulatory Visit: Payer: Self-pay

## 2018-09-06 DIAGNOSIS — I480 Paroxysmal atrial fibrillation: Secondary | ICD-10-CM

## 2018-09-06 DIAGNOSIS — I495 Sick sinus syndrome: Secondary | ICD-10-CM

## 2018-09-06 LAB — CUP PACEART REMOTE DEVICE CHECK
Battery Impedance: 4935 Ohm
Battery Remaining Longevity: 8 mo
Battery Voltage: 2.68 V
Brady Statistic AP VP Percent: 4 %
Brady Statistic AP VS Percent: 89 %
Brady Statistic AS VP Percent: 0 %
Brady Statistic AS VS Percent: 7 %
Date Time Interrogation Session: 20200513135904
Implantable Lead Implant Date: 20090527
Implantable Lead Implant Date: 20090527
Implantable Lead Location: 753859
Implantable Lead Location: 753860
Implantable Lead Model: 5076
Implantable Lead Model: 5076
Implantable Pulse Generator Implant Date: 20090527
Lead Channel Impedance Value: 522 Ohm
Lead Channel Impedance Value: 560 Ohm
Lead Channel Pacing Threshold Amplitude: 0.625 V
Lead Channel Pacing Threshold Amplitude: 0.625 V
Lead Channel Pacing Threshold Pulse Width: 0.4 ms
Lead Channel Pacing Threshold Pulse Width: 0.4 ms
Lead Channel Setting Pacing Amplitude: 2 V
Lead Channel Setting Pacing Amplitude: 2.5 V
Lead Channel Setting Pacing Pulse Width: 0.4 ms
Lead Channel Setting Sensing Sensitivity: 2 mV

## 2018-09-08 ENCOUNTER — Telehealth: Payer: Self-pay | Admitting: Internal Medicine

## 2018-09-08 NOTE — Telephone Encounter (Signed)
Spoke with patient, should be ok to use printer. Stay at least 6" away from device.  Chanetta Marshall, NP 09/08/2018 11:33 AM

## 2018-09-08 NOTE — Telephone Encounter (Signed)
New Message   Pt bought a printer and she says there is a warning for pace maker users She as questions   Please call

## 2018-09-11 ENCOUNTER — Ambulatory Visit: Payer: Medicare Other | Admitting: Psychology

## 2018-09-18 ENCOUNTER — Ambulatory Visit (HOSPITAL_COMMUNITY): Payer: Medicare Other | Admitting: Psychiatry

## 2018-09-19 ENCOUNTER — Other Ambulatory Visit: Payer: Self-pay

## 2018-09-19 ENCOUNTER — Ambulatory Visit (INDEPENDENT_AMBULATORY_CARE_PROVIDER_SITE_OTHER): Payer: Medicare Other | Admitting: Psychiatry

## 2018-09-19 ENCOUNTER — Encounter (HOSPITAL_COMMUNITY): Payer: Self-pay | Admitting: Psychiatry

## 2018-09-19 DIAGNOSIS — F411 Generalized anxiety disorder: Secondary | ICD-10-CM | POA: Diagnosis not present

## 2018-09-19 DIAGNOSIS — I2510 Atherosclerotic heart disease of native coronary artery without angina pectoris: Secondary | ICD-10-CM

## 2018-09-19 DIAGNOSIS — F330 Major depressive disorder, recurrent, mild: Secondary | ICD-10-CM

## 2018-09-19 MED ORDER — LORAZEPAM 0.5 MG PO TABS: ORAL_TABLET | ORAL | 2 refills | Status: DC

## 2018-09-19 MED ORDER — ESCITALOPRAM OXALATE 10 MG PO TABS: ORAL_TABLET | ORAL | 1 refills | Status: DC

## 2018-09-19 NOTE — Progress Notes (Signed)
Virtual Visit via Telephone Note  I connected with [name] on 09/19/18 at  2:00 PM EDT by telephone and verified that I am speaking with the correct person using two identifiers.   I discussed the limitations, risks, security and privacy concerns of performing an evaluation and management service by telephone and the availability of in person appointments. I also discussed with the patient that there may be a patient responsible charge related to this service. The patient expressed understanding and agreed to proceed.   History of Present Illness: Patient was evaluated by phone session.  She is taking Lexapro and Ativan as needed.  She feels safe as she lives in assisted living facility and there are no visitors allow.  In the ED she was scared with COVID-19 but she is happy with the administration and the measures they took.  She is not even able to physically met her daughter who usually drops the grocery and see-through window.  She is sleeping good.  She is using CPAP is helping her sleep.  She had a history of DVT and she had used in the past oxygen but she feels relieved that she is no longer using supplemental oxygen.  She has some concern about her memory but she was able to answer memory question very well.  She admitted sometimes difficulty remembering new things but her remote and distant memory is good.  Her appetite is okay.  Her energy level is good.  She try to keep herself busy.  She is in therapy with Dr. Cheryln Manly.  She denies any crying spells or any panic attack.  She denies any feeling of hopelessness or worthlessness.  Overall she is concerned about her general health but she is not obsess about her symptoms.  She denies any anger, mania, hallucination.  She like to continue Lexapro and Ativan as needed.  Her energy level is good.  She had blood work 2 months ago.  Her CBC and basic chemistry is normal.  She is taking Eliquis for her DVT.  Patient told today her blood pressure is  117/67 and her weight is 169 pounds.  Patient denies drinking or using any illegal substances.   Past Psychiatric History: Reviewed. History of one psychiatric inpatient treatment because of suicidal thinking.  No history of suicidal attempt, mania or psychosis.  Recent Results (from the past 2160 hour(s))  CUP PACEART REMOTE DEVICE CHECK     Status: None   Collection Time: 07/06/18  2:48 PM  Result Value Ref Range   Date Time Interrogation Session [id number]    Pulse Generator Manufacturer MERM    Pulse Gen Model VEDR01 Versa    Pulse Gen Serial Number [id number]    Clinic Name [facility]    Implantable Pulse Generator Type Implantable Pulse Generator    Implantable Pulse Generator Implant Date 52778242    Implantable Lead Manufacturer [employer]    Implantable Lead Model 5076 CapSureFix Novus    Implantable Lead Serial Number [id number]    Implantable Lead Implant Date 35361443    Implantable Lead Location Detail 1 APEX    Implantable Lead Location [id number]    Implantable Lead Manufacturer [facility]    Implantable Lead Model 5076 CapSureFix Novus    Implantable Lead Serial Number [id number]    Implantable Lead Implant Date 15400867    Implantable Lead Location Detail 1 APPENDAGE    Implantable Lead Location [id number]    Lead Channel Setting Sensing Sensitivity 2.80 mV   Lead Channel Setting Pacing Amplitude  2.000 V   Lead Channel Setting Pacing Pulse Width 0.40 ms   Lead Channel Setting Pacing Amplitude 2.500 V   Lead Channel Impedance Value 554 ohm   Lead Channel Pacing Threshold Amplitude 0.625 V   Lead Channel Pacing Threshold Pulse Width 0.40 ms   Lead Channel Impedance Value 542 ohm   Lead Channel Pacing Threshold Amplitude 0.750 V   Lead Channel Pacing Threshold Pulse Width 0.40 ms   Battery Status OK    Battery Remaining Longevity 9 mo   Battery Voltage 2.67 V   Battery Impedance 4,751 ohm   Brady Statistic AP VP Percent 5 %   Brady Statistic AS VP Percent 0 %    Brady Statistic AP VS Percent 87 %   Brady Statistic AS VS Percent 7 %  CMP (Cancer Center only)     Status: Abnormal   Collection Time: 07/07/18 11:44 AM  Result Value Ref Range   Sodium 140 135 - 145 mmol/L   Potassium 4.6 3.5 - 5.1 mmol/L   Chloride 104 98 - 111 mmol/L   CO2 26 22 - 32 mmol/L   Glucose, Bld 84 70 - 99 mg/dL   BUN 25 (H) 8 - 23 mg/dL   Creatinine 1.27 (H) 0.44 - 1.00 mg/dL   Calcium 9.7 8.9 - 10.3 mg/dL   Total Protein 7.6 6.5 - 8.1 g/dL   Albumin 4.1 3.5 - 5.0 g/dL   AST 18 15 - 41 U/L   ALT 10 0 - 44 U/L   Alkaline Phosphatase 78 38 - 126 U/L   Total Bilirubin 0.8 0.3 - 1.2 mg/dL   GFR, Est Non Af Am 38 (L) >60 mL/min   GFR, Est AFR Am 44 (L) >60 mL/min   Anion gap 10 5 - 15    Comment: Performed at [facility], [address], [place], [place] [address]  CUP PACEART REMOTE DEVICE CHECK     Status: None   Collection Time: 08/07/18  2:57 PM  Result Value Ref Range   Date Time Interrogation Session [id number]    Pulse Generator Manufacturer MERM    Pulse Gen Model VEDR01 Versa    Pulse Gen Serial Number [id number]    Clinic Name [facility] Pulse Generator Type Implantable Pulse Generator    Implantable Pulse Generator Implant Date [id number]    Implantable Lead Manufacturer MERM    Implantable Lead Model 5076 CapSureFix Novus    Implantable Lead Serial Number [id number]    Implantable Lead Implant Date 98921194    Implantable Lead Location Detail 1 APEX    Implantable Lead Location [id number]    Implantable Lead Manufacturer MERM    Implantable Lead Model 5076 CapSureFix Novus    Implantable Lead Serial Number [id number]    Implantable Lead Implant Date 17408144    Implantable Lead Location Detail 1 APPENDAGE    Implantable Lead Location [id number]    Lead Channel Setting Sensing Sensitivity 2.00 mV   Lead Channel Setting Pacing Amplitude 2.000 V   Lead Channel Setting Pacing Pulse Width 0.40 ms   Lead Channel  Setting Pacing Amplitude 2.500 V   Lead Channel Impedance Value 562 ohm   Lead Channel Pacing Threshold Amplitude 0.625 V   Lead Channel Pacing Threshold Pulse Width 0.40 ms   Lead Channel Impedance Value 552 ohm   Lead Channel Pacing Threshold Amplitude 0.625 V   Lead Channel Pacing Threshold Pulse Width 0.40 ms   Battery Status OK  Battery Remaining Longevity 8 mo   Battery Voltage 2.69 V   Battery Impedance 4,918 ohm   Brady Statistic AP VP Percent 3 %   Brady Statistic AS VP Percent 0 %   Brady Statistic AP VS Percent 89 %   Brady Statistic AS VS Percent 7 %  CUP PACEART REMOTE DEVICE CHECK     Status: None   Collection Time: 09/06/18  1:59 PM  Result Value Ref Range   Date Time Interrogation Session [id number]    Pulse Generator Manufacturer MERM    Pulse Gen Model VEDR01 Versa    Pulse Gen Serial Number [id number]    Clinic Name [facility]    Implantable Pulse Generator Type Implantable Pulse Generator    Implantable Pulse Generator Implant Date [id number]    Implantable Lead Manufacturer MERM    Implantable Lead Model 5076 CapSureFix Novus    Implantable Lead Serial Number [id number]    Implantable Lead Implant Date 26948546    Implantable Lead Location Detail 1 APEX    Implantable Lead Location [id number]    Implantable Lead Manufacturer MERM    Implantable Lead Model 5076 CapSureFix Novus    Implantable Lead Serial Number [id number]    Implantable Lead Implant Date 18299371    Implantable Lead Location Detail 1 APPENDAGE    Implantable Lead Location [id number]    Lead Channel Setting Sensing Sensitivity 2.00 mV   Lead Channel Setting Pacing Amplitude 2.000 V   Lead Channel Setting Pacing Pulse Width 0.40 ms   Lead Channel Setting Pacing Amplitude 2.500 V   Lead Channel Impedance Value 560 ohm   Lead Channel Pacing Threshold Amplitude 0.625 V   Lead Channel Pacing Threshold Pulse Width 0.40 ms   Lead Channel Impedance Value 522 ohm   Lead Channel Pacing  Threshold Amplitude 0.625 V   Lead Channel Pacing Threshold Pulse Width 0.40 ms   Battery Status OK    Battery Remaining Longevity 8 mo   Battery Voltage 2.68 V   Battery Impedance 4,935 ohm   Brady Statistic AP VP Percent 4 %   Brady Statistic AS VP Percent 0 %   Brady Statistic AP VS Percent 89 %   Brady Statistic AS VS Percent 7 %    Psychiatric Specialty Exam: Physical Exam  ROS  There were no vitals taken for this visit.There is no height or weight on file to calculate BMI.  General Appearance: NA  Eye Contact:  NA  Speech:  Clear and Coherent and Slow  Volume:  Normal  Mood:  Anxious  Affect:  NA  Thought Process:  Goal Directed  Orientation:  Full (Time, Place, and Person)  Thought Content:  Logical  Suicidal Thoughts:  No  Homicidal Thoughts:  No  Memory:  Immediate;   Good Recent;   Good Remote;   Good  Judgement:  Good  Insight:  Good  Psychomotor Activity:  NA  Concentration:  Concentration: Fair and Attention Span: Fair  Recall:  Good  Fund of Knowledge:  Good  Language:  Good  Akathisia:  No  Handed:  Right  AIMS (if indicated):     Assets:  Communication Skills Desire for Improvement Housing Social Support  ADL's:  Intact  Cognition:  WNL  Sleep:   good    Assessment and Plan: Major depressive disorder, recurrent.  Generalized anxiety disorder.  Patient is a stable on her current medication.  I reviewed blood work results and collateral information.  Encouraged to continue therapy with Dr.  Gutterman.  Reassurance given about her memory.  Continue Lexapro 10 mg daily and lorazepam 0.5 mg as needed for severe anxiety.  Discussed medication side effects and benefits.  Recommended to call us back if she has any question or any concern.  Follow-up in 6 months.  Follow Up Instructions:    I discussed the assessment and treatment plan with the patient. The patient was provided an opportunity to ask questions and all were answered. The patient agreed  with the plan and demonstrated an understanding of the instructions.   The patient was advised to call back or seek an in-person evaluation if the symptoms worsen or if the condition fails to improve as anticipated.  I provided 20 minutes of non-face-to-face time during this encounter.   Kathlee Nations, MD

## 2018-09-20 DIAGNOSIS — J3089 Other allergic rhinitis: Secondary | ICD-10-CM | POA: Diagnosis not present

## 2018-09-20 DIAGNOSIS — J301 Allergic rhinitis due to pollen: Secondary | ICD-10-CM | POA: Diagnosis not present

## 2018-09-22 NOTE — Progress Notes (Signed)
Remote pacemaker transmission.   

## 2018-10-01 ENCOUNTER — Other Ambulatory Visit (HOSPITAL_COMMUNITY): Payer: Self-pay | Admitting: Cardiology

## 2018-10-02 ENCOUNTER — Ambulatory Visit: Payer: Self-pay | Admitting: Neurology

## 2018-10-06 ENCOUNTER — Encounter: Payer: Self-pay | Admitting: Neurology

## 2018-10-06 ENCOUNTER — Other Ambulatory Visit: Payer: Self-pay

## 2018-10-06 ENCOUNTER — Telehealth (INDEPENDENT_AMBULATORY_CARE_PROVIDER_SITE_OTHER): Payer: Medicare Other | Admitting: Neurology

## 2018-10-06 VITALS — Ht 60.0 in | Wt 167.0 lb

## 2018-10-06 DIAGNOSIS — G63 Polyneuropathy in diseases classified elsewhere: Secondary | ICD-10-CM

## 2018-10-06 DIAGNOSIS — I2510 Atherosclerotic heart disease of native coronary artery without angina pectoris: Secondary | ICD-10-CM | POA: Diagnosis not present

## 2018-10-06 NOTE — Progress Notes (Signed)
    Virtual Visit via Telephone Note The purpose of this virtual visit is to provide medical care while limiting exposure to the novel coronavirus.    Consent was obtained for phone visit:  Yes.   Answered questions that patient had about telehealth interaction:  Yes.   I discussed the limitations, risks, security and privacy concerns of performing an evaluation and management service by telephone. I also discussed with the patient that there may be a patient responsible charge related to this service. The patient expressed understanding and agreed to proceed.  Pt location: Home Physician Location: office Name of referring provider:  Plotnikov, Evie Lacks, MD I connected with .[name] at patients initiation/request on 10/06/2018 at  1:50 PM EDT by telephone and verified that I am speaking with the correct person using two identifiers.  Pt MRN:  [id number] Pt DOB:  [birth year]   History of Present Illness: This is a 85 y.o. female returning for follow-up of peripheral neuropathy.  She continues to have numbness/tingling of the feet, which is stable.  She completed balance exercises at [facility] earlier this year, which has helped her a lot.  She rarely uses a cane, mostly for long distances.  She continues to manage her own IADLs, including meal preparation, medications, household chores (cleaning, laundry).    Assessment and Plan:   Peripheral neuropathy, stable.  Risk factors include hypothyroidism, discoid lupus, and diabetes.  She has mild paresthesias of the feet which she does not wish to take any medication for.  Balance has significantly improved with doing exercises earlier this year.  She uses a cane only as needed, more for long distances.  She does not have any new neurological complaints.  Fall precautions were stressed again especially as she is on anticoagulation therapy.  Return to clinic as needed  Follow Up Instructions:    discussed the assessment and treatment plan  with the patient. The patient was provided an opportunity to ask questions and all were answered. The patient agreed with the plan and demonstrated an understanding of the instructions.   The patient was advised to call back or seek an in-person evaluation if the symptoms worsen or if the condition fails to improve as anticipated.    Total Time spent in visit with the patient was:  21min, of which 100% of the time was spent in counseling and/or coordinating care.   Pt understands and agrees with the plan of care outlined.     Alda Berthold, DO

## 2018-10-09 ENCOUNTER — Ambulatory Visit (INDEPENDENT_AMBULATORY_CARE_PROVIDER_SITE_OTHER): Payer: Medicare Other | Admitting: *Deleted

## 2018-10-09 ENCOUNTER — Ambulatory Visit: Payer: Medicare Other | Admitting: Psychology

## 2018-10-09 ENCOUNTER — Ambulatory Visit (INDEPENDENT_AMBULATORY_CARE_PROVIDER_SITE_OTHER): Payer: Medicare Other | Admitting: Psychology

## 2018-10-09 DIAGNOSIS — F332 Major depressive disorder, recurrent severe without psychotic features: Secondary | ICD-10-CM

## 2018-10-09 DIAGNOSIS — I495 Sick sinus syndrome: Secondary | ICD-10-CM

## 2018-10-09 LAB — CUP PACEART REMOTE DEVICE CHECK
Battery Impedance: 5164 Ohm
Battery Remaining Longevity: 7 mo
Battery Voltage: 2.67 V
Brady Statistic AP VP Percent: 4 %
Brady Statistic AP VS Percent: 89 %
Brady Statistic AS VP Percent: 0 %
Brady Statistic AS VS Percent: 7 %
Date Time Interrogation Session: 20200615120020
Implantable Lead Implant Date: 20090527
Implantable Lead Implant Date: 20090527
Implantable Lead Location: 753859
Implantable Lead Location: 753860
Implantable Lead Model: 5076
Implantable Lead Model: 5076
Implantable Pulse Generator Implant Date: 20090527
Lead Channel Impedance Value: 537 Ohm
Lead Channel Impedance Value: 557 Ohm
Lead Channel Pacing Threshold Amplitude: 0.625 V
Lead Channel Pacing Threshold Amplitude: 0.625 V
Lead Channel Pacing Threshold Pulse Width: 0.4 ms
Lead Channel Pacing Threshold Pulse Width: 0.4 ms
Lead Channel Setting Pacing Amplitude: 2 V
Lead Channel Setting Pacing Amplitude: 2.5 V
Lead Channel Setting Pacing Pulse Width: 0.4 ms
Lead Channel Setting Sensing Sensitivity: 2 mV

## 2018-10-16 ENCOUNTER — Encounter: Payer: Self-pay | Admitting: Cardiology

## 2018-10-16 NOTE — Progress Notes (Signed)
Remote pacemaker transmission.   

## 2018-10-19 ENCOUNTER — Other Ambulatory Visit: Payer: Self-pay | Admitting: Internal Medicine

## 2018-10-19 DIAGNOSIS — J301 Allergic rhinitis due to pollen: Secondary | ICD-10-CM | POA: Diagnosis not present

## 2018-10-19 DIAGNOSIS — J3089 Other allergic rhinitis: Secondary | ICD-10-CM | POA: Diagnosis not present

## 2018-10-30 DIAGNOSIS — J301 Allergic rhinitis due to pollen: Secondary | ICD-10-CM | POA: Diagnosis not present

## 2018-10-30 DIAGNOSIS — J3089 Other allergic rhinitis: Secondary | ICD-10-CM | POA: Diagnosis not present

## 2018-11-01 DIAGNOSIS — J3089 Other allergic rhinitis: Secondary | ICD-10-CM | POA: Diagnosis not present

## 2018-11-01 DIAGNOSIS — J301 Allergic rhinitis due to pollen: Secondary | ICD-10-CM | POA: Diagnosis not present

## 2018-11-06 ENCOUNTER — Ambulatory Visit (INDEPENDENT_AMBULATORY_CARE_PROVIDER_SITE_OTHER): Payer: Medicare Other | Admitting: Psychology

## 2018-11-06 DIAGNOSIS — F332 Major depressive disorder, recurrent severe without psychotic features: Secondary | ICD-10-CM | POA: Diagnosis not present

## 2018-11-06 DIAGNOSIS — J301 Allergic rhinitis due to pollen: Secondary | ICD-10-CM | POA: Diagnosis not present

## 2018-11-06 DIAGNOSIS — J3089 Other allergic rhinitis: Secondary | ICD-10-CM | POA: Diagnosis not present

## 2018-11-09 ENCOUNTER — Telehealth: Payer: Self-pay | Admitting: *Deleted

## 2018-11-09 NOTE — Telephone Encounter (Signed)
Received call from pt stating she has a marble sized lump in her right calf.  Pt denies redness, warmth or pain.  Pt states she is currently still taking her blood thinner Eliquis.  Pt states the lump has been present for a few months and has not bothered her, she just wanted Dr. Geralyn Flash advice.  Per Dr. Lindi Adie, pt to continue to monitor lump and if it becomes bigger or causes any pain to give the office a call.  Pt verbalized understanding.

## 2018-11-09 NOTE — Telephone Encounter (Signed)
Received VM from pt.  Attempt x1 to return call, no answer, LVM 

## 2018-11-13 DIAGNOSIS — J301 Allergic rhinitis due to pollen: Secondary | ICD-10-CM | POA: Diagnosis not present

## 2018-11-13 DIAGNOSIS — J3089 Other allergic rhinitis: Secondary | ICD-10-CM | POA: Diagnosis not present

## 2018-11-16 ENCOUNTER — Ambulatory Visit (INDEPENDENT_AMBULATORY_CARE_PROVIDER_SITE_OTHER): Payer: Medicare Other | Admitting: *Deleted

## 2018-11-16 ENCOUNTER — Other Ambulatory Visit: Payer: Self-pay | Admitting: Internal Medicine

## 2018-11-16 ENCOUNTER — Other Ambulatory Visit (HOSPITAL_COMMUNITY): Payer: Self-pay | Admitting: Cardiology

## 2018-11-16 DIAGNOSIS — J3089 Other allergic rhinitis: Secondary | ICD-10-CM | POA: Diagnosis not present

## 2018-11-16 DIAGNOSIS — I480 Paroxysmal atrial fibrillation: Secondary | ICD-10-CM

## 2018-11-16 DIAGNOSIS — I495 Sick sinus syndrome: Secondary | ICD-10-CM

## 2018-11-16 DIAGNOSIS — J301 Allergic rhinitis due to pollen: Secondary | ICD-10-CM | POA: Diagnosis not present

## 2018-11-16 LAB — CUP PACEART REMOTE DEVICE CHECK
Battery Impedance: 5264 Ohm
Battery Remaining Longevity: 6 mo
Battery Voltage: 2.67 V
Brady Statistic AP VP Percent: 5 %
Brady Statistic AP VS Percent: 88 %
Brady Statistic AS VP Percent: 0 %
Brady Statistic AS VS Percent: 6 %
Date Time Interrogation Session: 20200723122638
Implantable Lead Implant Date: 20090527
Implantable Lead Implant Date: 20090527
Implantable Lead Location: 753859
Implantable Lead Location: 753860
Implantable Lead Model: 5076
Implantable Lead Model: 5076
Implantable Pulse Generator Implant Date: 20090527
Lead Channel Impedance Value: 559 Ohm
Lead Channel Impedance Value: 576 Ohm
Lead Channel Pacing Threshold Amplitude: 0.625 V
Lead Channel Pacing Threshold Amplitude: 0.625 V
Lead Channel Pacing Threshold Pulse Width: 0.4 ms
Lead Channel Pacing Threshold Pulse Width: 0.4 ms
Lead Channel Sensing Intrinsic Amplitude: 5.6 mV
Lead Channel Setting Pacing Amplitude: 2 V
Lead Channel Setting Pacing Amplitude: 2.5 V
Lead Channel Setting Pacing Pulse Width: 0.4 ms
Lead Channel Setting Sensing Sensitivity: 2 mV

## 2018-11-20 DIAGNOSIS — J3089 Other allergic rhinitis: Secondary | ICD-10-CM | POA: Diagnosis not present

## 2018-11-20 DIAGNOSIS — J301 Allergic rhinitis due to pollen: Secondary | ICD-10-CM | POA: Diagnosis not present

## 2018-11-22 DIAGNOSIS — J301 Allergic rhinitis due to pollen: Secondary | ICD-10-CM | POA: Diagnosis not present

## 2018-11-22 DIAGNOSIS — J3089 Other allergic rhinitis: Secondary | ICD-10-CM | POA: Diagnosis not present

## 2018-11-27 DIAGNOSIS — J301 Allergic rhinitis due to pollen: Secondary | ICD-10-CM | POA: Diagnosis not present

## 2018-11-27 DIAGNOSIS — J3089 Other allergic rhinitis: Secondary | ICD-10-CM | POA: Diagnosis not present

## 2018-11-27 NOTE — Progress Notes (Signed)
Remote pacemaker transmission.   

## 2018-12-04 ENCOUNTER — Telehealth: Payer: Self-pay

## 2018-12-04 ENCOUNTER — Ambulatory Visit (INDEPENDENT_AMBULATORY_CARE_PROVIDER_SITE_OTHER): Payer: Medicare Other | Admitting: Psychology

## 2018-12-04 DIAGNOSIS — F332 Major depressive disorder, recurrent severe without psychotic features: Secondary | ICD-10-CM

## 2018-12-04 NOTE — Telephone Encounter (Signed)
Spoke to pt regarding ERI alert. Pt scheduled monthly battery checks previously, pt has appt w/ Dr. Caryl Comes 8/25. Pt verbalized understanding, no questions at this time.

## 2018-12-05 DIAGNOSIS — J301 Allergic rhinitis due to pollen: Secondary | ICD-10-CM | POA: Diagnosis not present

## 2018-12-05 DIAGNOSIS — J3089 Other allergic rhinitis: Secondary | ICD-10-CM | POA: Diagnosis not present

## 2018-12-18 ENCOUNTER — Ambulatory Visit (INDEPENDENT_AMBULATORY_CARE_PROVIDER_SITE_OTHER): Payer: Medicare Other | Admitting: *Deleted

## 2018-12-18 DIAGNOSIS — I495 Sick sinus syndrome: Secondary | ICD-10-CM

## 2018-12-18 DIAGNOSIS — I480 Paroxysmal atrial fibrillation: Secondary | ICD-10-CM

## 2018-12-19 ENCOUNTER — Other Ambulatory Visit: Payer: Self-pay

## 2018-12-19 ENCOUNTER — Encounter: Payer: Self-pay | Admitting: Internal Medicine

## 2018-12-19 ENCOUNTER — Ambulatory Visit (INDEPENDENT_AMBULATORY_CARE_PROVIDER_SITE_OTHER): Payer: Medicare Other | Admitting: Internal Medicine

## 2018-12-19 VITALS — BP 110/74 | HR 66 | Ht 60.0 in | Wt 166.0 lb

## 2018-12-19 DIAGNOSIS — I2510 Atherosclerotic heart disease of native coronary artery without angina pectoris: Secondary | ICD-10-CM

## 2018-12-19 DIAGNOSIS — I10 Essential (primary) hypertension: Secondary | ICD-10-CM

## 2018-12-19 DIAGNOSIS — R0600 Dyspnea, unspecified: Secondary | ICD-10-CM | POA: Diagnosis not present

## 2018-12-19 DIAGNOSIS — I495 Sick sinus syndrome: Secondary | ICD-10-CM | POA: Diagnosis not present

## 2018-12-19 DIAGNOSIS — Z950 Presence of cardiac pacemaker: Secondary | ICD-10-CM

## 2018-12-19 LAB — CUP PACEART INCLINIC DEVICE CHECK
Battery Impedance: 5629 Ohm
Battery Remaining Longevity: 5 mo
Battery Voltage: 2.65 V
Brady Statistic AP VP Percent: 6 %
Brady Statistic AP VS Percent: 88 %
Brady Statistic AS VP Percent: 0 %
Brady Statistic AS VS Percent: 6 %
Date Time Interrogation Session: 20200825153734
Implantable Lead Implant Date: 20090527
Implantable Lead Implant Date: 20090527
Implantable Lead Location: 753859
Implantable Lead Location: 753860
Implantable Lead Model: 5076
Implantable Lead Model: 5076
Implantable Pulse Generator Implant Date: 20090527
Lead Channel Impedance Value: 549 Ohm
Lead Channel Impedance Value: 549 Ohm
Lead Channel Impedance Value: 565 Ohm
Lead Channel Impedance Value: 565 Ohm
Lead Channel Pacing Threshold Amplitude: 0.5 V
Lead Channel Pacing Threshold Amplitude: 0.5 V
Lead Channel Pacing Threshold Amplitude: 0.625 V
Lead Channel Pacing Threshold Amplitude: 0.625 V
Lead Channel Pacing Threshold Amplitude: 0.625 V
Lead Channel Pacing Threshold Amplitude: 0.625 V
Lead Channel Pacing Threshold Amplitude: 0.75 V
Lead Channel Pacing Threshold Amplitude: 0.75 V
Lead Channel Pacing Threshold Pulse Width: 0.4 ms
Lead Channel Pacing Threshold Pulse Width: 0.4 ms
Lead Channel Pacing Threshold Pulse Width: 0.4 ms
Lead Channel Pacing Threshold Pulse Width: 0.4 ms
Lead Channel Pacing Threshold Pulse Width: 0.4 ms
Lead Channel Pacing Threshold Pulse Width: 0.4 ms
Lead Channel Pacing Threshold Pulse Width: 0.4 ms
Lead Channel Pacing Threshold Pulse Width: 0.4 ms
Lead Channel Sensing Intrinsic Amplitude: 4 mV
Lead Channel Sensing Intrinsic Amplitude: 4 mV
Lead Channel Sensing Intrinsic Amplitude: 4 mV
Lead Channel Sensing Intrinsic Amplitude: 4 mV
Lead Channel Setting Pacing Amplitude: 2 V
Lead Channel Setting Pacing Amplitude: 2.5 V
Lead Channel Setting Pacing Pulse Width: 0.4 ms
Lead Channel Setting Sensing Sensitivity: 2 mV

## 2018-12-19 LAB — CUP PACEART REMOTE DEVICE CHECK
Battery Impedance: 5531 Ohm
Battery Remaining Longevity: 5 mo
Battery Voltage: 2.65 V
Brady Statistic AP VP Percent: 6 %
Brady Statistic AP VS Percent: 88 %
Brady Statistic AS VP Percent: 0 %
Brady Statistic AS VS Percent: 6 %
Date Time Interrogation Session: 20200824140654
Implantable Lead Implant Date: 20090527
Implantable Lead Implant Date: 20090527
Implantable Lead Location: 753859
Implantable Lead Location: 753860
Implantable Lead Model: 5076
Implantable Lead Model: 5076
Implantable Pulse Generator Implant Date: 20090527
Lead Channel Impedance Value: 538 Ohm
Lead Channel Impedance Value: 567 Ohm
Lead Channel Pacing Threshold Amplitude: 0.625 V
Lead Channel Pacing Threshold Amplitude: 0.625 V
Lead Channel Pacing Threshold Pulse Width: 0.4 ms
Lead Channel Pacing Threshold Pulse Width: 0.4 ms
Lead Channel Setting Pacing Amplitude: 2 V
Lead Channel Setting Pacing Amplitude: 2.5 V
Lead Channel Setting Pacing Pulse Width: 0.4 ms
Lead Channel Setting Sensing Sensitivity: 2 mV

## 2018-12-19 NOTE — Patient Instructions (Signed)
Medication Instructions:  Your provider recommends that you continue on your current medications as directed. Please refer to the Current Medication list given to you today.    Labwork: None  Testing/Procedures: None  Follow-Up: Your provider wants you to follow-up in: 1 year. You will receive a reminder letter in the mail two months in advance. If you don't receive a letter, please call our office to schedule the follow-up appointment.    Any Other Special Instructions Will Be Listed Below (If Applicable).     If you need a refill on your cardiac medications before your next appointment, please call your pharmacy.

## 2018-12-19 NOTE — Progress Notes (Signed)
Patient Care Team: Plotnikov, Evie Lacks, MD as PCP - General Arfeen, Arlyce Harman, MD (Psychiatry) Richmond Campbell, MD (Gastroenterology) Fay Records, MD (Cardiology) Johnathan Hausen, MD (General Surgery) Melrose Nakayama, MD as Consulting Physician (Orthopedic Surgery) Larey Dresser, MD as Consulting Physician (Cardiology) Deboraha Sprang, MD as Consulting Physician (Cardiology) Brand Males, MD as Consulting Physician (Pulmonary Disease) Alda Berthold, DO as Consulting Physician (Neurology)   HPI  [name] is a 85 y.o. female seen in followup for orthostatic intolerance, exercise intolerance, and prior pacemaker implantation for sinus node dysfunction.   . Stress echo 2012 normal LV function.   Echo (7/17): EF 55-60%, mild MR with mitral valve prolapse, moderate TR, normal RV size and systolic function, PASP 30 mmHg.  -Lexiscan Cardiolite (9/17) with EF 55%, no ischemia/infarction - RHC (10/17) with mean RA 6, PA 28/9, mean PCWP 7, CI 2.37     DVT and pulmonary embolism.  On apixaban.     Chronic DOE  No edema no chest pain   Currently at [facility] but will soon be moving in with her daughter     Date Cr K Hgb  12/19 1.18 4.5 12.5  3/20 1.24 4.6 13.7      Past Medical History:  Diagnosis Date  . Anxiety   . Bradycardia   . Breast cancer, left breast ([place]) 1995  . Breast cancer, right breast ([place]) 2002  . CHF (congestive heart failure) ([place])   . Depression    Dr Cheryln Manly  . Discoid lupus    skin  . Full dentures   . GERD (gastroesophageal reflux disease)   . Glaucoma   . Gout   . Hx of echocardiogram    a.  Echocardiogram (03/2011): Mild LVH, EF 0000000, grade 1 diastolic dysfunction, mild MR, mild to moderate TR, PASP 42;  b. Echo (05/2013):  Mod LVH, EF 65-70%, no WMA, Gr 1 DD, mild MR, mod TR, PASP 39  . Hx of exercise stress test    a. ETT-echocardiogram (06/08/11): Sub-optimal exercise. Test stopped early due to dizziness and  hypotension. EF 60%.  Non-diagnostic.;   b. Lexiscan Myoview (06/2013):  Diaphragmatic attenuation, no ischemia, EF 61%.  Low Risk  . Hypertension   . Hypothyroidism   . IBS (irritable bowel syndrome)   . IBS (irritable bowel syndrome)   . Incontinence   . OSA (obstructive sleep apnea) 07/20/2016   Mild with AHI 12/hr now on CPAP  . OSA on CPAP    "quit wearing mask; retested; not enough information; to be tested again 04/08/2016"  . Osteoarthritis   . Osteopenia   . Pacemaker 2009   Brady//Chronotropic incompetence with normal pacemaker function  . Thyroid disease   . Wears hearing aid    both ears    Past Surgical History:  Procedure Laterality Date  . BREAST BIOPSY Left 1995  . BREAST LUMPECTOMY Left 1995   axillary node dissectoin  . CARDIAC CATHETERIZATION N/A 02/13/2016   Procedure: Right Heart Cath;  Surgeon: Larey Dresser, MD;  Location: [place] [facility];  Service: Cardiovascular;  Laterality: N/A;  . CATARACT EXTRACTION W/ INTRAOCULAR LENS  IMPLANT, BILATERAL Bilateral 06/2009 - 12.2915   left - right  . DORSAL COMPARTMENT RELEASE  2000   left  . HAMMER TOE SURGERY Left 2004  . HEMORRHOID BANDING    . INSERT / REPLACE / REMOVE PACEMAKER  2009  . KNEE ARTHROSCOPY Left 08/29/2012   Procedure: LEFT KNEE  ARTHROSCOPY ;  Surgeon: Hessie Dibble, MD;  Location: [place];  Service: Orthopedics;  Laterality: Left;  [name] MASTECTOMY Bilateral 09/2000   nbx  . TRIGGER FINGER RELEASE Right 07/31/2013   Procedure: EXCISE MASS RIGHT INDEX A-1 PLLLEY RELEASE A-1 RIGHT INDEX;  Surgeon: Cammie Sickle., MD;  Location: [facility];  Service: Orthopedics;  Laterality: Right;  [name] VAGINAL HYSTERECTOMY      Current Outpatient Medications  Medication Sig Dispense Refill  . cetirizine (ZYRTEC) 10 MG tablet Take 10 mg by mouth daily as needed for allergies.    [name] ELIQUIS 5 MG TABS tablet TAKE 1 TABLET BY MOUTH TWICE A DAY 180 tablet 1  . escitalopram  (LEXAPRO) 10 MG tablet TAKE 1 TABLET (10 MG) BY MOUTH DAILY AT BEDTIME. 90 tablet 1  . LORazepam (ATIVAN) 0.5 MG tablet Take 1 tab daily as needed for anxiety 30 tablet 2  . magnesium oxide (MAG-OX) 400 MG tablet TAKE 1/2 TABLET BY MOUTH EVERY DAY 45 tablet 0  . Magnesium Oxide 200 MG TABS Take 1 tablet (200 mg total) by mouth daily. 90 tablet 3  . REFRESH TEARS 0.5 % SOLN Place 1 drop into both eyes daily as needed (dry eyes).   0  . spironolactone (ALDACTONE) 25 MG tablet TAKE 1 TABLET BY MOUTH EVERY DAY 90 tablet 3  . SYNTHROID 100 MCG tablet Take 1 tablet (100 mcg total) by mouth daily before breakfast. 30 tablet 11  . tolterodine (DETROL LA) 2 MG 24 hr capsule Take 2 mg by mouth daily.     No current facility-administered medications for this visit.     Allergies  Allergen Reactions  . Lactase Diarrhea  . Amlodipine Besy-Benazepril Hcl Swelling  . Amlodipine Besylate Swelling  . Clonidine Hydrochloride     REACTION: tired  . Lactose Intolerance (Gi) Other (See Comments)  . Valsartan Other (See Comments)    Tongue swelling   . Verapamil     Review of Systems negative except from HPI and PMH  Physical Exam BP 110/74   Pulse 66   Ht 5' (1.524 m)   Wt 166 lb (75.3 kg)   SpO2 98%   BMI 32.42 kg/m  .   Well developed and well nourished in no acute distress HENT normal Neck supple with JVP-flat Clear Device pocket well healed; without hematoma or erythema.  There is no tethering  Regular rate and rhythm, no gallop 2/6 murmur Abd-soft with active BS No Clubbing cyanosis  edema Skin-warm and dry A & Oriented  Grossly normal sensory and motor function  ECG A pacing @ 66 23/07/42 Axis -56   Assessment and  Plan  Sinus node dysfunction   Pacemaker  Medtronic approaching ERI   Hypertension  Dyspnea-multifactorial  Depression  DVT PE    BP well controlled  Euvolemic continue current meds  On Anticoagulation;  No bleeding issues

## 2018-12-25 ENCOUNTER — Encounter: Payer: Self-pay | Admitting: Internal Medicine

## 2018-12-25 NOTE — Progress Notes (Signed)
Remote pacemaker transmission.   

## 2018-12-25 NOTE — Addendum Note (Signed)
Addended by: Douglass Rivers D on: 12/25/2018 11:39 AM   Modules accepted: Level of Service

## 2019-01-08 ENCOUNTER — Encounter: Payer: Self-pay | Admitting: Internal Medicine

## 2019-01-08 ENCOUNTER — Ambulatory Visit (INDEPENDENT_AMBULATORY_CARE_PROVIDER_SITE_OTHER): Payer: Medicare Other | Admitting: Internal Medicine

## 2019-01-08 DIAGNOSIS — K2100 Gastro-esophageal reflux disease with esophagitis, without bleeding: Secondary | ICD-10-CM

## 2019-01-08 DIAGNOSIS — I10 Essential (primary) hypertension: Secondary | ICD-10-CM

## 2019-01-08 DIAGNOSIS — I480 Paroxysmal atrial fibrillation: Secondary | ICD-10-CM | POA: Diagnosis not present

## 2019-01-08 DIAGNOSIS — F411 Generalized anxiety disorder: Secondary | ICD-10-CM

## 2019-01-08 DIAGNOSIS — F320 Major depressive disorder, single episode, mild: Secondary | ICD-10-CM

## 2019-01-08 DIAGNOSIS — I2699 Other pulmonary embolism without acute cor pulmonale: Secondary | ICD-10-CM | POA: Diagnosis not present

## 2019-01-08 MED ORDER — FAMOTIDINE 40 MG PO TABS: 40.0000 mg | ORAL_TABLET | Freq: Every day | ORAL | 3 refills | Status: DC

## 2019-01-08 NOTE — Assessment & Plan Note (Signed)
pepcid po

## 2019-01-08 NOTE — Assessment & Plan Note (Signed)
Not happy at [place] to move in w/her dtr soon Lorazepam prn, Lexapro

## 2019-01-08 NOTE — Assessment & Plan Note (Signed)
H/o PAF  Dr Lequita Halt On Eliquis

## 2019-01-08 NOTE — Progress Notes (Signed)
Virtual Visit via Telephone Note  I connected with [name] on 01/08/19 at  1:20 PM EDT by telephone and verified that I am speaking with the correct person using two identifiers.   I discussed the limitations, risks, security and privacy concerns of performing an evaluation and management service by telephone and the availability of in person appointments. I also discussed with the patient that there may be a patient responsible charge related to this service. The patient expressed understanding and agreed to proceed.   History of Present Illness: C/o GERD and stress F/u  IBS, PE, CHF Not happy at [place] to move in w/her dtr soon   Observations/Objective:   Assessment and Plan:   Follow Up Instructions:    I discussed the assessment and treatment plan with the patient. The patient was provided an opportunity to ask questions and all were answered. The patient agreed with the plan and demonstrated an understanding of the instructions.   The patient was advised to call back or seek an in-person evaluation if the symptoms worsen or if the condition fails to improve as anticipated.  I provided 22 minutes of non-face-to-face time during this encounter.   Walker Kehr, MD

## 2019-01-08 NOTE — Assessment & Plan Note (Signed)
Spironolactone

## 2019-01-08 NOTE — Assessment & Plan Note (Signed)
Lexapro 

## 2019-01-08 NOTE — Assessment & Plan Note (Signed)
On Eliquis

## 2019-01-18 ENCOUNTER — Ambulatory Visit (INDEPENDENT_AMBULATORY_CARE_PROVIDER_SITE_OTHER): Payer: Medicare Other | Admitting: *Deleted

## 2019-01-18 DIAGNOSIS — I495 Sick sinus syndrome: Secondary | ICD-10-CM

## 2019-01-18 LAB — CUP PACEART REMOTE DEVICE CHECK
Battery Impedance: 6136 Ohm
Battery Remaining Longevity: 2 mo
Battery Voltage: 2.65 V
Brady Statistic AP VP Percent: 10 %
Brady Statistic AP VS Percent: 82 %
Brady Statistic AS VP Percent: 1 %
Brady Statistic AS VS Percent: 7 %
Date Time Interrogation Session: 20200924121606
Implantable Lead Implant Date: 20090527
Implantable Lead Implant Date: 20090527
Implantable Lead Location: 753859
Implantable Lead Location: 753860
Implantable Lead Model: 5076
Implantable Lead Model: 5076
Implantable Pulse Generator Implant Date: 20090527
Lead Channel Impedance Value: 550 Ohm
Lead Channel Impedance Value: 571 Ohm
Lead Channel Pacing Threshold Amplitude: 0.625 V
Lead Channel Pacing Threshold Amplitude: 0.625 V
Lead Channel Pacing Threshold Pulse Width: 0.4 ms
Lead Channel Pacing Threshold Pulse Width: 0.4 ms
Lead Channel Sensing Intrinsic Amplitude: 5.6 mV
Lead Channel Setting Pacing Amplitude: 2 V
Lead Channel Setting Pacing Amplitude: 2.5 V
Lead Channel Setting Pacing Pulse Width: 0.4 ms
Lead Channel Setting Sensing Sensitivity: 2 mV

## 2019-01-24 ENCOUNTER — Encounter: Payer: Self-pay | Admitting: Cardiology

## 2019-01-24 NOTE — Progress Notes (Signed)
Remote pacemaker transmission.   

## 2019-01-29 ENCOUNTER — Ambulatory Visit: Payer: Medicare Other | Admitting: Psychology

## 2019-01-29 ENCOUNTER — Telehealth: Payer: Self-pay | Admitting: Internal Medicine

## 2019-01-29 ENCOUNTER — Ambulatory Visit (INDEPENDENT_AMBULATORY_CARE_PROVIDER_SITE_OTHER): Payer: Medicare Other | Admitting: Psychology

## 2019-01-29 DIAGNOSIS — F332 Major depressive disorder, recurrent severe without psychotic features: Secondary | ICD-10-CM

## 2019-01-29 NOTE — Telephone Encounter (Signed)
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 She states the facility she at she has to do a lot and she thinks that may be why she always out of breathe. I asked the pt to send a manual transmission with her home monitor for the nurse to review it. I told her once the nurse review it she will get a phone call back.

## 2019-01-29 NOTE — Telephone Encounter (Signed)
I spoke to the patient who has been experiencing SOB with exertion over the past 2 months.  She has been trying to do more activity, like cook, but just can't do it.    She denies CP or other symptoms.  She was wondering if anything showed up on her most recent transmission that would contribute.  I told her that I would forward to get further advisement.  She verbalized understanding.

## 2019-01-29 NOTE — Telephone Encounter (Addendum)
Reviewed transmission. Normal PPM function. Estimated remaining longevity 2 months until ERI. AP 93.2%, VP 8.3%. 0.1% AT/AF burden, longest 1hr 61min on 12/21/18, most recent on 12/29/18, 66min 4sec duration. Presenting rhythm AP/VS @ 71bpm.  Spoke with patient. Explained that transmission shows PPM is still functioning normally, SOB is not likely related to  Continue monthly battery checks. Pt reports minimal LE edema, correlates with her arthritis. Denies any awareness of weight gain or fluid retention. Reports she made some soup that was too salty a few days ago, but SOB has not been worse since then. Reports compliance with spironolactone. Advised I will forward this message back to triage and Dr. Caryl Comes for further advisement. Pt in agreement with plan, denies additional PPM-related questions at this time.

## 2019-01-29 NOTE — Telephone Encounter (Signed)
Pt c/o Shortness Of Breath: STAT if SOB developed within the last 24 hours or pt is noticeably SOB on the phone  1. Are you currently SOB (can you hear that pt is SOB on the phone)? no  2. How long have you been experiencing SOB? About 2-3 weeks   3. Are you SOB when sitting or when up moving around? Moving around   4. Are you currently experiencing any other symptoms? No other symptoms.

## 2019-01-29 NOTE — Telephone Encounter (Signed)
Transmission received.

## 2019-01-31 NOTE — Telephone Encounter (Signed)
Noted  

## 2019-02-02 NOTE — Telephone Encounter (Signed)
Spoke with patient and advised Dr. Caryl Comes has no new recommendations at this time. Pt verbalizes understanding, reports she is soon moving from independent living and will live with her daughter. She thinks that this will be very helpful as she will not need to do all of her own cooking and cleaning. She denies any questions at this time and thanked me for my call.

## 2019-02-09 NOTE — Progress Notes (Signed)
Monthly battery checks previously arranged.

## 2019-02-15 ENCOUNTER — Other Ambulatory Visit: Payer: Self-pay | Admitting: Internal Medicine

## 2019-02-15 NOTE — Telephone Encounter (Signed)
Requested medication (s) are due for refill today: yes  Requested medication (s) are on the active medication list: yes  Last refill:  03/08/2018  Future visit scheduled: no  Notes to clinic:  Last filled by historical provider  Please contact patient if unable to fill   Requested Prescriptions  Pending Prescriptions Disp Refills   tolterodine (DETROL LA) 2 MG 24 hr capsule       Sig: Take 1 capsule (2 mg total) by mouth daily.     Urology:  Bladder Agents Passed - 02/15/2019 11:52 AM      Passed - Valid encounter within last 12 months    Recent Outpatient Visits          1 month ago Benign essential HTN   [place] Plotnikov, Evie Lacks, MD   7 months ago Deep vein thrombosis (DVT) of distal vein of left lower extremity, unspecified chronicity ([place])   [place], Evie Lacks, MD   10 months ago PE (pulmonary thromboembolism) [facility])   [place], MD   11 months ago Acute pain of right knee   [place], [place] Repress, Zue[place]   1 year ago PE (pulmonary thromboembolism) [facility])   Therapist, [facility] Plotnikov, Evie Lacks, MD

## 2019-02-15 NOTE — Telephone Encounter (Signed)
Medication Refill - Medication: tolterodine (DETROL LA) 2 MG 24 hr capsule  Pt requesting callback if there is an issue with refill.  Has the patient contacted their pharmacy? Yes.   (Agent: If no, request that the patient contact the pharmacy for the refill.) (Agent: If yes, when and what did the pharmacy advise?)  Preferred Pharmacy (with phone number or street name): [id number] [place] [place] [phone] (Phone) [phone] (Fax)     Agent: Please be advised that RX refills may take up to 3 business days. We ask that you follow-up with your pharmacy.

## 2019-02-16 MED ORDER — TOLTERODINE TARTRATE ER 2 MG PO CP24: 2.0000 mg | ORAL_CAPSULE | Freq: Every day | ORAL | 11 refills | Status: DC

## 2019-02-18 ENCOUNTER — Other Ambulatory Visit: Payer: Self-pay | Admitting: Internal Medicine

## 2019-02-19 ENCOUNTER — Ambulatory Visit (INDEPENDENT_AMBULATORY_CARE_PROVIDER_SITE_OTHER): Payer: Medicare Other | Admitting: *Deleted

## 2019-02-19 DIAGNOSIS — I495 Sick sinus syndrome: Secondary | ICD-10-CM

## 2019-02-19 DIAGNOSIS — I480 Paroxysmal atrial fibrillation: Secondary | ICD-10-CM

## 2019-02-19 LAB — CUP PACEART REMOTE DEVICE CHECK
Battery Impedance: 6846 Ohm
Battery Remaining Longevity: 1 mo — CL
Battery Voltage: 2.63 V
Brady Statistic AP VP Percent: 6 %
Brady Statistic AP VS Percent: 87 %
Brady Statistic AS VP Percent: 1 %
Brady Statistic AS VS Percent: 6 %
Date Time Interrogation Session: 20201026120718
Implantable Lead Implant Date: 20090527
Implantable Lead Implant Date: 20090527
Implantable Lead Location: 753859
Implantable Lead Location: 753860
Implantable Lead Model: 5076
Implantable Lead Model: 5076
Implantable Pulse Generator Implant Date: 20090527
Lead Channel Impedance Value: 514 Ohm
Lead Channel Impedance Value: 553 Ohm
Lead Channel Pacing Threshold Amplitude: 0.5 V
Lead Channel Pacing Threshold Amplitude: 0.625 V
Lead Channel Pacing Threshold Pulse Width: 0.4 ms
Lead Channel Pacing Threshold Pulse Width: 0.4 ms
Lead Channel Setting Pacing Amplitude: 2 V
Lead Channel Setting Pacing Amplitude: 2.5 V
Lead Channel Setting Pacing Pulse Width: 0.4 ms
Lead Channel Setting Sensing Sensitivity: 2 mV

## 2019-02-20 ENCOUNTER — Other Ambulatory Visit (HOSPITAL_COMMUNITY): Payer: Self-pay | Admitting: Cardiology

## 2019-02-20 ENCOUNTER — Telehealth: Payer: Self-pay | Admitting: Internal Medicine

## 2019-02-20 NOTE — Telephone Encounter (Signed)
Spoke with the pts daughter, Precious Bard, on the pts DPR, and she is asking about having FMLA forms filled out for her due to the pts cognitive level.. she is unable to take care of herself for reasons such as tipping the delivery man $300 and not $3....however it was returned to them.   Precious Bard, her daughter, works for the school system and she is reluctant to return to the school setting dur to [place] so she is going to keep the pt at home and take care of her there and needs papers stating that she will be home with her.   She reports skilled nursing home is too expensive for them at this time.    I advised her to talk with Dr. Alain Marion her PCP about the forms and how he can assist with all of her pertinent health information and about her mental issues and not just cardiac health. She verbalized understanding and agreed. She will take the forms to his office after she calls this afternoon.

## 2019-02-20 NOTE — Telephone Encounter (Signed)
New Message     Precious Bard is calling and says she has paperwork that she needs to have filled out so she can take care of the pt    Please call back

## 2019-02-21 ENCOUNTER — Telehealth: Payer: Self-pay | Admitting: Internal Medicine

## 2019-02-21 NOTE — Telephone Encounter (Signed)
Patients daughter [name]) is requesting a condition leave started 02/19/19 to 04/30/19.  Due to the risk of bringing home COVID to her mother and also to help care for her mother.   OV has been made for 02/22/19 to discuss with provider for approval.

## 2019-02-22 ENCOUNTER — Ambulatory Visit (INDEPENDENT_AMBULATORY_CARE_PROVIDER_SITE_OTHER): Payer: Medicare Other | Admitting: Internal Medicine

## 2019-02-22 ENCOUNTER — Encounter: Payer: Self-pay | Admitting: Internal Medicine

## 2019-02-22 ENCOUNTER — Other Ambulatory Visit: Payer: Self-pay | Admitting: Internal Medicine

## 2019-02-22 DIAGNOSIS — I272 Pulmonary hypertension, unspecified: Secondary | ICD-10-CM

## 2019-02-22 DIAGNOSIS — F411 Generalized anxiety disorder: Secondary | ICD-10-CM | POA: Diagnosis not present

## 2019-02-22 NOTE — Assessment & Plan Note (Signed)
The patient had to move in with her daughter Myles Gip in in [place], [place]. Pam teaches school.  Pam is a high risk for bringing COVID-19 virus home and infection her mother.  We will fill out FMLA form to keep her out of work till April 30, 2019.

## 2019-02-22 NOTE — Progress Notes (Signed)
Virtual Visit via Telephone Note  I connected with [name] on 02/22/19 at 11:20 AM EDT by telephone and verified that I am speaking with the correct person using two identifiers.   I discussed the limitations, risks, security and privacy concerns of performing an evaluation and management service by telephone and the availability of in person appointments. I also discussed with the patient that there may be a patient responsible charge related to this service. The patient expressed understanding and agreed to proceed.   History of Present Illness:   The patient had to move in with her daughter [name] in in [place], [place]. [name] teaches school.  [name] is a high risk for bringing COVID-19 virus home and infection her mother.  We will fill out FMLA form to keep her out of work till April 30, 2019.  Observations/Objective:  I spoke with [name] and with the patient.  Ms. [name] sounds good on the phone Assessment and Plan:  See plan Follow Up Instructions:    I discussed the assessment and treatment plan with the patient. The patient was provided an opportunity to ask questions and all were answered. The patient agreed with the plan and demonstrated an understanding of the instructions.   The patient was advised to call back or seek an in-person evaluation if the symptoms worsen or if the condition fails to improve as anticipated.  I provided 12 minutes of non-face-to-face time during this encounter.   Walker Kehr, MD

## 2019-02-22 NOTE — Telephone Encounter (Signed)
Forms have been completed, Faxed to Precious Batts @ [employer] @[phone], Copy sent to scan.   Charged for under daughter Precious Bard) She has been informed and original mailed for her records.

## 2019-02-22 NOTE — Assessment & Plan Note (Signed)
The patient had to move in with her daughter [name] in in [place], [place]. [name] teaches school.  [name] is a high risk for bringing COVID-19 virus home and infection her mother.  We will fill out FMLA form to keep her out of work till April 30, 2019.  In general the patient is doing much better at [facility].

## 2019-02-26 ENCOUNTER — Ambulatory Visit (INDEPENDENT_AMBULATORY_CARE_PROVIDER_SITE_OTHER): Payer: Medicare Other | Admitting: Psychology

## 2019-02-26 DIAGNOSIS — F332 Major depressive disorder, recurrent severe without psychotic features: Secondary | ICD-10-CM | POA: Diagnosis not present

## 2019-03-08 NOTE — Progress Notes (Signed)
Remote pacemaker transmission.   

## 2019-03-12 ENCOUNTER — Telehealth (HOSPITAL_COMMUNITY): Payer: Self-pay | Admitting: *Deleted

## 2019-03-12 DIAGNOSIS — F330 Major depressive disorder, recurrent, mild: Secondary | ICD-10-CM

## 2019-03-12 MED ORDER — LORAZEPAM 0.5 MG PO TABS: ORAL_TABLET | ORAL | 0 refills | Status: DC

## 2019-03-12 NOTE — Telephone Encounter (Signed)
[facility] [address]

## 2019-03-12 NOTE — Telephone Encounter (Signed)
Pt called requesting a refill of her Lorazepam. Last seen 09/19/18. Is scheduled for f/u on 11/23. Please review and advise.

## 2019-03-12 NOTE — Addendum Note (Signed)
Addended by: Berniece Andreas T on: 03/12/2019 01:29 PM   Modules accepted: Orders

## 2019-03-13 ENCOUNTER — Telehealth (HOSPITAL_COMMUNITY): Payer: Self-pay | Admitting: *Deleted

## 2019-03-13 ENCOUNTER — Other Ambulatory Visit (HOSPITAL_COMMUNITY): Payer: Self-pay

## 2019-03-13 DIAGNOSIS — F411 Generalized anxiety disorder: Secondary | ICD-10-CM

## 2019-03-13 DIAGNOSIS — F330 Major depressive disorder, recurrent, mild: Secondary | ICD-10-CM

## 2019-03-15 DIAGNOSIS — J301 Allergic rhinitis due to pollen: Secondary | ICD-10-CM | POA: Diagnosis not present

## 2019-03-15 DIAGNOSIS — J3089 Other allergic rhinitis: Secondary | ICD-10-CM | POA: Diagnosis not present

## 2019-03-19 ENCOUNTER — Ambulatory Visit (INDEPENDENT_AMBULATORY_CARE_PROVIDER_SITE_OTHER): Payer: Medicare Other | Admitting: Psychiatry

## 2019-03-19 ENCOUNTER — Other Ambulatory Visit: Payer: Self-pay

## 2019-03-19 DIAGNOSIS — F330 Major depressive disorder, recurrent, mild: Secondary | ICD-10-CM

## 2019-03-19 DIAGNOSIS — F411 Generalized anxiety disorder: Secondary | ICD-10-CM

## 2019-03-19 DIAGNOSIS — Z7901 Long term (current) use of anticoagulants: Secondary | ICD-10-CM | POA: Diagnosis not present

## 2019-03-19 LAB — CUP PACEART REMOTE DEVICE CHECK
Battery Impedance: 7219 Ohm
Battery Remaining Longevity: 1 mo — CL
Battery Voltage: 2.62 V
Brady Statistic AP VP Percent: 5 %
Brady Statistic AP VS Percent: 88 %
Brady Statistic AS VP Percent: 1 %
Brady Statistic AS VS Percent: 6 %
Date Time Interrogation Session: 20201123100033
Implantable Lead Implant Date: 20090527
Implantable Lead Implant Date: 20090527
Implantable Lead Location: 753859
Implantable Lead Location: 753860
Implantable Lead Model: 5076
Implantable Lead Model: 5076
Implantable Pulse Generator Implant Date: 20090527
Lead Channel Impedance Value: 501 Ohm
Lead Channel Impedance Value: 573 Ohm
Lead Channel Pacing Threshold Amplitude: 0.625 V
Lead Channel Pacing Threshold Amplitude: 0.625 V
Lead Channel Pacing Threshold Pulse Width: 0.4 ms
Lead Channel Pacing Threshold Pulse Width: 0.4 ms
Lead Channel Setting Pacing Amplitude: 2 V
Lead Channel Setting Pacing Amplitude: 2.5 V
Lead Channel Setting Pacing Pulse Width: 0.4 ms
Lead Channel Setting Sensing Sensitivity: 2 mV

## 2019-03-19 MED ORDER — LORAZEPAM 0.5 MG PO TABS: ORAL_TABLET | ORAL | 1 refills | Status: DC

## 2019-03-19 MED ORDER — ESCITALOPRAM OXALATE 10 MG PO TABS: ORAL_TABLET | ORAL | 0 refills | Status: DC

## 2019-03-19 NOTE — Progress Notes (Signed)
Virtual Visit via Telephone Note  I connected with [name] on 03/19/19 at  2:00 PM EST by telephone and verified that I am speaking with the correct person using two identifiers.   I discussed the limitations, risks, security and privacy concerns of performing an evaluation and management service by telephone and the availability of in person appointments. I also discussed with the patient that there may be a patient responsible charge related to this service. The patient expressed understanding and agreed to proceed.   History of Present Illness: Patient was evaluated by phone session.  We have tried multiple times to her phone but she has a hard time getting on the phone.  Patient told she is now living with her daughter and her husband.  Sometimes she feels her freedom is taken away.  She feels nervous and anxious.  Recently she requested refill of lorazepam and we have called the prescription but she is upset because it went to her previous pharmacy.  She did not mention that she has changed the pharmacy since she moved.  She feels her anxiety is increased because change in her living situation but she also feels safe since she is in the house and not living in assisted living facility.  She is sleeping okay.  She is taking Lexapro but has not taken Ativan in a while until recently picked up from the previous pharmacy.  She denies any panic attack or any crying spells.  She had.  Anxious due to Covid and does not leave the house unless it is important.  She denies any mania, hallucination, psychosis or any suicidal thoughts.  Her energy level is fair.  Her appetite is okay.    Past Psychiatric History:Reviewed. History of one psychiatric inpatient treatment because of suicidal thinking. No history of suicidal attempt, mania or psychosis.  Psychiatric Specialty Exam: Physical Exam  ROS  There were no vitals taken for this visit.There is no height or weight on file to calculate BMI.   General Appearance: NA  Eye Contact:  NA  Speech:  Normal Rate and Slow  Volume:  Normal  Mood:  Anxious  Affect:  NA  Thought Process:  Goal Directed  Orientation:  Full (Time, Place, and Person)  Thought Content:  Rumination  Suicidal Thoughts:  No  Homicidal Thoughts:  No  Memory:  Immediate;   Good Recent;   Good Remote;   Good  Judgement:  Fair  Insight:  Good  Psychomotor Activity:  NA  Concentration:  Concentration: Good and Attention Span: Fair  Recall:  Good  Fund of Knowledge:  Good  Language:  Good  Akathisia:  No  Handed:  Right  AIMS (if indicated):     Assets:  Communication Skills Desire for Improvement Housing Resilience Social Support  ADL's:  Intact  Cognition:  WNL  Sleep:   ok      Assessment and Plan: Major depressive disorder, recurrent.  Generalized anxiety disorder.  Reassurance given.  I encouraged that it is a safe decision to stay with the family rather than an assisted living facility due to Covid condition.  After reassurance she is more relaxed.  I encouraged to restart therapy with Dr. Cheryln Manly.  We will continue Lexapro 10 mg daily and lorazepam 0.5 mg as needed for severe anxiety.  Discussed medication side effects and benefits.  Recommended to call us back if is any question of any concern.  Patient like to have a follow-up appointment in 3 months.  Follow Up  Instructions:    I discussed the assessment and treatment plan with the patient. The patient was provided an opportunity to ask questions and all were answered. The patient agreed with the plan and demonstrated an understanding of the instructions.   The patient was advised to call back or seek an in-person evaluation if the symptoms worsen or if the condition fails to improve as anticipated.  I provided 20 minutes of non-face-to-face time during this encounter.   Kathlee Nations, MD

## 2019-03-26 ENCOUNTER — Ambulatory Visit (INDEPENDENT_AMBULATORY_CARE_PROVIDER_SITE_OTHER): Payer: Medicare Other | Admitting: Psychology

## 2019-03-26 DIAGNOSIS — F332 Major depressive disorder, recurrent severe without psychotic features: Secondary | ICD-10-CM | POA: Diagnosis not present

## 2019-03-27 ENCOUNTER — Telehealth: Payer: Self-pay

## 2019-03-27 NOTE — Telephone Encounter (Signed)
Can you change FMLA?

## 2019-03-27 NOTE — Telephone Encounter (Signed)
Copied from [place] [id number]. Topic: General - Other >> Mar 27, 2019  8:58 AM Leward Quan A wrote: Reason for CRM: Patient called to inquire of  Dr Alain Marion if he can please give an extension to her daughter Verneice Rexroad in order for her to continue staying with her so that she can get completely better. Patient states that she is still no all better as of yet and with Patti's help she will be ok. But states that if Precious Bard does not get the FMLA extension then she the patient will have to go back into a facility. Asking for an extension on the FMLA paper work completed about one month ago. She is asking for a one month extension. Ph# [phone] or [phone]

## 2019-03-27 NOTE — Telephone Encounter (Signed)
Ok Thx 

## 2019-03-28 NOTE — Telephone Encounter (Signed)
[name] is requesting leave until 06/03/2019. Forms have been updated and faxed to [employer].

## 2019-03-28 NOTE — Telephone Encounter (Signed)
Pt called stating her daughter is wanting to have her work note extended further than 04/30/2019. Please contact Patti directly. Please advise.    [phone]

## 2019-03-28 NOTE — Telephone Encounter (Signed)
Called Patti - Unable to reach her. I spoke with Don Perking and she will have her give me call once she returns home.  I wanted to inform her she is already wrote until 04/30/2019.

## 2019-03-29 DIAGNOSIS — H26492 Other secondary cataract, left eye: Secondary | ICD-10-CM | POA: Diagnosis not present

## 2019-03-29 DIAGNOSIS — H40013 Open angle with borderline findings, low risk, bilateral: Secondary | ICD-10-CM | POA: Diagnosis not present

## 2019-03-29 DIAGNOSIS — Z961 Presence of intraocular lens: Secondary | ICD-10-CM | POA: Diagnosis not present

## 2019-04-23 ENCOUNTER — Ambulatory Visit (INDEPENDENT_AMBULATORY_CARE_PROVIDER_SITE_OTHER): Payer: Medicare Other | Admitting: *Deleted

## 2019-04-23 ENCOUNTER — Ambulatory Visit (INDEPENDENT_AMBULATORY_CARE_PROVIDER_SITE_OTHER): Payer: Medicare Other | Admitting: Psychology

## 2019-04-23 DIAGNOSIS — I480 Paroxysmal atrial fibrillation: Secondary | ICD-10-CM | POA: Diagnosis not present

## 2019-04-23 DIAGNOSIS — F332 Major depressive disorder, recurrent severe without psychotic features: Secondary | ICD-10-CM

## 2019-04-23 LAB — CUP PACEART REMOTE DEVICE CHECK
Battery Impedance: 7570 Ohm
Battery Remaining Longevity: 1 mo — CL
Battery Voltage: 2.59 V
Brady Statistic AP VP Percent: 5 %
Brady Statistic AP VS Percent: 87 %
Brady Statistic AS VP Percent: 1 %
Brady Statistic AS VS Percent: 7 %
Date Time Interrogation Session: 20201228102546
Implantable Lead Implant Date: 20090527
Implantable Lead Implant Date: 20090527
Implantable Lead Location: 753859
Implantable Lead Location: 753860
Implantable Lead Model: 5076
Implantable Lead Model: 5076
Implantable Pulse Generator Implant Date: 20090527
Lead Channel Impedance Value: 497 Ohm
Lead Channel Impedance Value: 552 Ohm
Lead Channel Pacing Threshold Amplitude: 0.625 V
Lead Channel Pacing Threshold Amplitude: 0.75 V
Lead Channel Pacing Threshold Pulse Width: 0.4 ms
Lead Channel Pacing Threshold Pulse Width: 0.4 ms
Lead Channel Setting Pacing Amplitude: 2 V
Lead Channel Setting Pacing Amplitude: 2.5 V
Lead Channel Setting Pacing Pulse Width: 0.4 ms
Lead Channel Setting Sensing Sensitivity: 2 mV

## 2019-04-25 ENCOUNTER — Telehealth: Payer: Self-pay | Admitting: Internal Medicine

## 2019-04-25 ENCOUNTER — Telehealth: Payer: Self-pay

## 2019-04-25 NOTE — Telephone Encounter (Signed)
YOUR CARDIOLOGY TEAM HAS ARRANGED FOR AN E-VISIT FOR YOUR APPOINTMENT - PLEASE REVIEW IMPORTANT INFORMATION BELOW SEVERAL DAYS PRIOR TO YOUR APPOINTMENT  Due to the recent COVID-19 pandemic, we are transitioning in-person office visits to tele-medicine visits in an effort to decrease unnecessary exposure to our patients, their families, and staff. These visits are billed to your insurance just like a normal visit is. We also encourage you to sign up for MyChart if you have not already done so. You will need a smartphone if possible. For patients that do not have this, we can still complete the visit using a regular telephone but do prefer a smartphone to enable video when possible. You may have a family member that lives with you that can help. If possible, we also ask that you have a blood pressure cuff and scale at home to measure your blood pressure, heart rate and weight prior to your scheduled appointment. Patients with clinical needs that need an in-person evaluation and testing will still be able to come to the office if absolutely necessary. If you have any questions, feel free to call our office.   THE DAY OF YOUR APPOINTMENT  Approximately 15 minutes prior to your scheduled appointment, you will receive a telephone call from one of [facility] team - your caller ID may say "Unknown caller."  Our staff will confirm medications, vital signs for the day and any symptoms you may be experiencing. Please have this information available prior to the time of visit start. It may also be helpful for you to have a pad of paper and pen handy for any instructions given during your visit. They will also walk you through joining the smartphone meeting if this is a video visit.    CONSENT FOR TELE-HEALTH VISIT - PLEASE REVIEW  I hereby voluntarily request, consent and authorize [facility] and its employed or contracted physicians, physician assistants, nurse practitioners or other licensed health care  professionals (the Practitioner), to provide me with telemedicine health care services (the "Services") as deemed necessary by the treating Practitioner. I acknowledge and consent to receive the Services by the Practitioner via telemedicine. I understand that the telemedicine visit will involve communicating with the Practitioner through live audiovisual communication technology and the disclosure of certain medical information by electronic transmission. I acknowledge that I have been given the opportunity to request an in-person assessment or other available alternative prior to the telemedicine visit and am voluntarily participating in the telemedicine visit.  I understand that I have the right to withhold or withdraw my consent to the use of telemedicine in the course of my care at any time, without affecting my right to future care or treatment, and that the Practitioner or I may terminate the telemedicine visit at any time. I understand that I have the right to inspect all information obtained and/or recorded in the course of the telemedicine visit and may receive copies of available information for a reasonable fee.  I understand that some of the potential risks of receiving the Services via telemedicine include:  [name] Delay or interruption in medical evaluation due to technological equipment failure or disruption; . Information transmitted may not be sufficient (e.g. poor resolution of images) to allow for appropriate medical decision making by the Practitioner; and/or  . In rare instances, security protocols could fail, causing a breach of personal health information.  Furthermore, I acknowledge that it is my responsibility to provide information about my medical history, conditions and care that is complete and  accurate to the best of my ability. I acknowledge that Practitioner's advice, recommendations, and/or decision may be based on factors not within their control, such as incomplete or inaccurate  data provided by me or distortions of diagnostic images or specimens that may result from electronic transmissions. I understand that the practice of medicine is not an exact science and that Practitioner makes no warranties or guarantees regarding treatment outcomes. I acknowledge that I will receive a copy of this consent concurrently upon execution via email to the email address I last provided but may also request a printed copy by calling the office of [place].    I understand that my insurance will be billed for this visit.   I have read or had this consent read to me. . I understand the contents of this consent, which adequately explains the benefits and risks of the Services being provided via telemedicine.  . I have been provided ample opportunity to ask questions regarding this consent and the Services and have had my questions answered to my satisfaction. . I give my informed consent for the services to be provided through the use of telemedicine in my medical care  By participating in this telemedicine visit I agree to the above.

## 2019-04-25 NOTE — Telephone Encounter (Signed)
New Message   Pt c/o Shortness Of Breath: STAT if SOB developed within the last 24 hours or pt is noticeably SOB on the phone  1. Are you currently SOB (can you hear that pt is SOB on the phone)? Yes  2. How long have you been experiencing SOB? 2-3 days  3. Are you SOB when sitting or when up moving around? Both, worse when up and moving around  4. Are you currently experiencing any other symptoms? BP 103/80 HR 65 and patient states that she feels like she is going to pass out. When SOB is bad, she has chest pain.

## 2019-04-25 NOTE — Telephone Encounter (Signed)
Spoke with patient who has having increased SOB over the past 2-3 days. She states that her SOB is mostly with exertion. She denies dizziness and chest pain. She reports some lightheadedness. Her BP was 118/75 with HR of 65. Patient states that she has some slight swelling in her right leg but has not gained any weight. She also reports that she has not been able to use her CPAP for 3 months. I have made her an appointment to see Dr. Radford Pax next week 01/05 and advised her that if symptoms worsen she should call EMS.

## 2019-05-01 ENCOUNTER — Encounter: Payer: Self-pay | Admitting: Cardiology

## 2019-05-01 ENCOUNTER — Telehealth (INDEPENDENT_AMBULATORY_CARE_PROVIDER_SITE_OTHER): Payer: Medicare Other | Admitting: Cardiology

## 2019-05-01 ENCOUNTER — Other Ambulatory Visit: Payer: Self-pay

## 2019-05-01 VITALS — BP 119/74 | HR 65 | Ht 62.0 in | Wt 156.0 lb

## 2019-05-01 DIAGNOSIS — G4733 Obstructive sleep apnea (adult) (pediatric): Secondary | ICD-10-CM | POA: Diagnosis not present

## 2019-05-01 DIAGNOSIS — I10 Essential (primary) hypertension: Secondary | ICD-10-CM

## 2019-05-01 NOTE — Progress Notes (Addendum)
Virtual Visit via Telephone Note   This visit type was conducted due to national recommendations for restrictions regarding the COVID-19 Pandemic (e.g. social distancing) in an effort to limit this patient's exposure and mitigate transmission in our community.  Due to her co-morbid illnesses, this patient is at least at moderate risk for complications without adequate follow up.  This format is felt to be most appropriate for this patient at this time.  The patient did not have access to video technology/had technical difficulties with video requiring transitioning to audio format only (telephone).  All issues noted in this document were discussed and addressed.  No physical exam could be performed with this format.  Please refer to the patient's chart for her  consent to telehealth for [facility].   Evaluation Performed:  Follow-up visit  This visit type was conducted due to national recommendations for restrictions regarding the COVID-19 Pandemic (e.g. social distancing).  This format is felt to be most appropriate for this patient at this time.  All issues noted in this document were discussed and addressed.  No physical exam was performed (except for noted visual exam findings with Video Visits).  Please refer to the patient's chart (MyChart message for video visits and phone note for telephone visits) for the patient's consent to telehealth for [facility].  Date:  05/01/2019   ID:  [name], DOB 27-Jan-1930, MRN [id number]  Patient Location:  Home  Provider location:   [place]  PCP:  Plotnikov, Evie Lacks, MD  Sleep Medicine: Fransico Him, MD   Electrophysiologist:  None   Chief Complaint:  OSA  History of Present Illness:    [name] is a 86 y.o. female who presents via audio/video conferencing for a telehealth visit today.    She has not used her PAP device since she moved in with her daughter in October.  She says that the room is small and she felt it was too  small for her to have her PCP device in the room.  She does have a table beside her bed that she could put her device on. She says that before she moved she used her device and was not having any problems with it.  She felt the pressure was fine and she was not having any problems with the mask.   The patient does not have symptoms concerning for COVID-19 infection (fever, chills, cough, or new shortness of breath).   Prior CV studies:   The following studies were reviewed today:  PAP compliance download  Past Medical History:  Diagnosis Date  . Anxiety   . Bradycardia   . Breast cancer, left breast ([place]) 1995  . Breast cancer, right breast ([place]) 2002  . CHF (congestive heart failure) ([place])   . Depression    Dr Cheryln Manly  . Discoid lupus    skin  . Full dentures   . GERD (gastroesophageal reflux disease)   . Glaucoma   . Gout   . Hx of echocardiogram    a.  Echocardiogram (03/2011): Mild LVH, EF 0000000, grade 1 diastolic dysfunction, mild MR, mild to moderate TR, PASP 42;  b. Echo (05/2013):  Mod LVH, EF 65-70%, no WMA, Gr 1 DD, mild MR, mod TR, PASP 39  . Hx of exercise stress test    a. ETT-echocardiogram (06/08/11): Sub-optimal exercise. Test stopped early due to dizziness and hypotension. EF 60%.  Non-diagnostic.;   b. Lexiscan Myoview (06/2013):  Diaphragmatic attenuation, no ischemia, EF 61%.  Low Risk  .  Hypertension   . Hypothyroidism   . IBS (irritable bowel syndrome)   . IBS (irritable bowel syndrome)   . Incontinence   . OSA (obstructive sleep apnea) 07/20/2016   Mild with AHI 12/hr now on CPAP  . OSA on CPAP    "quit wearing mask; retested; not enough information; to be tested again 04/08/2016"  . Osteoarthritis   . Osteopenia   . Pacemaker 2009   Brady//Chronotropic incompetence with normal pacemaker function  . Thyroid disease   . Wears hearing aid    both ears   Past Surgical History:  Procedure Laterality Date  . BREAST BIOPSY Left 1995  . BREAST LUMPECTOMY  Left 1995   axillary node dissectoin  . CARDIAC CATHETERIZATION N/A 02/13/2016   Procedure: Right Heart Cath;  Surgeon: Larey Dresser, MD;  Location: [facility];  Service: Cardiovascular;  Laterality: N/A;  . CATARACT EXTRACTION W/ INTRAOCULAR LENS  IMPLANT, BILATERAL Bilateral 06/2009 - 12.2915   left - right  . DORSAL COMPARTMENT RELEASE  2000   left  . HAMMER TOE SURGERY Left 2004  . HEMORRHOID BANDING    . INSERT / REPLACE / REMOVE PACEMAKER  2009  . KNEE ARTHROSCOPY Left 08/29/2012   Procedure: LEFT KNEE ARTHROSCOPY ;  Surgeon: Hessie Dibble, MD;  Location: [facility];  Service: Orthopedics;  Laterality: Left;  [name] MASTECTOMY Bilateral 09/2000   nbx  . TRIGGER FINGER RELEASE Right 07/31/2013   Procedure: EXCISE MASS RIGHT INDEX A-1 PLLLEY RELEASE A-1 RIGHT INDEX;  Surgeon: Cammie Sickle., MD;  Location: [place];  Service: Orthopedics;  Laterality: Right;  [name] VAGINAL HYSTERECTOMY       Current Meds  Medication Sig  . cetirizine (ZYRTEC) 10 MG tablet Take 10 mg by mouth daily as needed for allergies.  [name] ELIQUIS 5 MG TABS tablet TAKE 1 TABLET BY MOUTH TWICE A DAY  . escitalopram (LEXAPRO) 10 MG tablet TAKE 1 TABLET (10 MG) BY MOUTH DAILY AT BEDTIME.  . famotidine (PEPCID) 40 MG tablet Take 1 tablet (40 mg total) by mouth daily.  [name] LORazepam (ATIVAN) 0.5 MG tablet Take 1 tab daily as needed for anxiety  . magnesium oxide (MAG-OX) 400 MG tablet TAKE 1/2 TABLET BY MOUTH EVERY DAY  . Magnesium Oxide 200 MG TABS Take 1 tablet (200 mg total) by mouth daily.  [name] REFRESH TEARS 0.5 % SOLN Place 1 drop into both eyes daily as needed (dry eyes).   [name] spironolactone (ALDACTONE) 25 MG tablet TAKE 1 TABLET BY MOUTH EVERY DAY  . SYNTHROID 100 MCG tablet TAKE 1 TABLET BY MOUTH EVERY DAY  . tolterodine (DETROL LA) 2 MG 24 hr capsule Take 1 capsule (2 mg total) by mouth daily.     Allergies:   Lactase, Amlodipine besy-benazepril hcl, Amlodipine besylate,  Clonidine hydrochloride, Lactose intolerance (gi), Valsartan, and Verapamil   Social History   Tobacco Use  . Smoking status: Never Smoker  . Smokeless tobacco: Never Used  Substance Use Topics  . Alcohol use: No    Alcohol/week: 0.0 standard drinks  . Drug use: No     Family Hx: The patient's family history includes Cancer in her brother; Heart disease in her brother and father; Ovarian cancer in her mother; Stroke in her brother and sister.  ROS:   Please see the history of present illness.     All other systems reviewed and are negative.   Labs/Other Tests and Data Reviewed:  Recent Labs: 06/12/2018: Hemoglobin 13.7; Platelets 222 07/07/2018: ALT 10; BUN 25; Creatinine 1.27; Potassium 4.6; Sodium 140   Recent Lipid Panel Lab Results  Component Value Date/Time   CHOL 174 05/11/2010 12:29 PM   TRIG 78.0 05/11/2010 12:29 PM   HDL 52.30 05/11/2010 12:29 PM   CHOLHDL 3 05/11/2010 12:29 PM   LDLCALC 106 (H) 05/11/2010 12:29 PM   LDLDIRECT 130.8 09/30/2006 10:47 AM    Wt Readings from Last 3 Encounters:  05/01/19 156 lb (70.8 kg)  01/08/19 166 lb (75.3 kg)  12/19/18 166 lb (75.3 kg)     Objective:    Vital Signs:  BP 119/74   Pulse 65   Ht 5\' 2"  (1.575 m)   Wt 156 lb (70.8 kg)   BMI 28.53 kg/m     ASSESSMENT & PLAN:    1.  OSA - She has not used her device since she moved in with her daughter. The PAP download was reviewed today and showed an AHI of 13.8/hr on 13 cm H2O with 20% compliance in using more than 4 hours nightly. She has not used her device since October.  She is going to try to get back on her device and I will get a download in 4 weeks.   2.  HTN  -BP controlled -continue Arlyce Harman 25mg  daily  COVID-19 Education: The signs and symptoms of COVID-19 were discussed with the patient and how to seek care for testing (follow up with PCP or arrange E-visit).  The importance of social distancing was discussed today.  Patient Risk:   After full review  of this patient's clinical status, I feel that they are at least moderate risk at this time.  Time:   Today, I have spent 20 minutes directly with the patient on telemedicine discussing medical problems including OSA and HTN.  We also reviewed the symptoms of COVID 19 and the ways to protect against contracting the virus with telehealth technology.  I spent an additional 5 minutes reviewing patient's chart including PAP compliance download.  Medication Adjustments/Labs and Tests Ordered: Current medicines are reviewed at length with the patient today.  Concerns regarding medicines are outlined above.  Tests Ordered: No orders of the defined types were placed in this encounter.  Medication Changes: No orders of the defined types were placed in this encounter.   Disposition:  Follow up 3 months Signed, Fransico Him, MD  05/01/2019 9:26 AM    [facility]

## 2019-05-02 ENCOUNTER — Telehealth: Payer: Self-pay | Admitting: *Deleted

## 2019-05-02 DIAGNOSIS — H26492 Other secondary cataract, left eye: Secondary | ICD-10-CM | POA: Diagnosis not present

## 2019-05-02 NOTE — Telephone Encounter (Signed)
-----   Message from Sueanne Margarita, MD sent at 05/01/2019  9:34 AM EST ----- Get download in 4 weeks and followup with me in 3 months virtual

## 2019-05-02 NOTE — Telephone Encounter (Signed)
Reminder made for 4 week d/l and 3 month recall made.

## 2019-05-08 ENCOUNTER — Ambulatory Visit: Payer: Medicare Other | Admitting: Psychology

## 2019-05-10 DIAGNOSIS — J301 Allergic rhinitis due to pollen: Secondary | ICD-10-CM | POA: Diagnosis not present

## 2019-05-10 DIAGNOSIS — J3089 Other allergic rhinitis: Secondary | ICD-10-CM | POA: Diagnosis not present

## 2019-05-17 ENCOUNTER — Other Ambulatory Visit (HOSPITAL_COMMUNITY): Payer: Self-pay | Admitting: Cardiology

## 2019-05-17 IMAGING — CT CT HEAD W/O CM
4 series · 16 of 47 positions shown, 18 images · non-contrast
Comparison: PET-CT 03/22/2016

CLINICAL DATA: Pt c/o a fall earlier today. Pt states she felt
dizzy prior to the fall and is unsure if fall was related to syncope
or not. Pt is currently on blood thinners. Head trauma, minor, pt on
anticoagulation

EXAM:
CT HEAD WITHOUT CONTRAST
TECHNIQUE: Contiguous axial images were obtained from the base of the skull
through the vertex without intravenous contrast.

[Series 3: head without · axial · non-contrast · 0.39mm/px · z∈[-151,-31]mm · 7 of 32 slices shown, 9 images]
[im 4/32  brain]
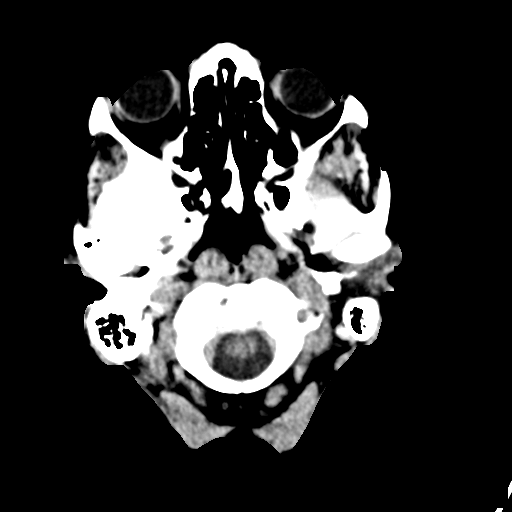
[im 4/32  bone]
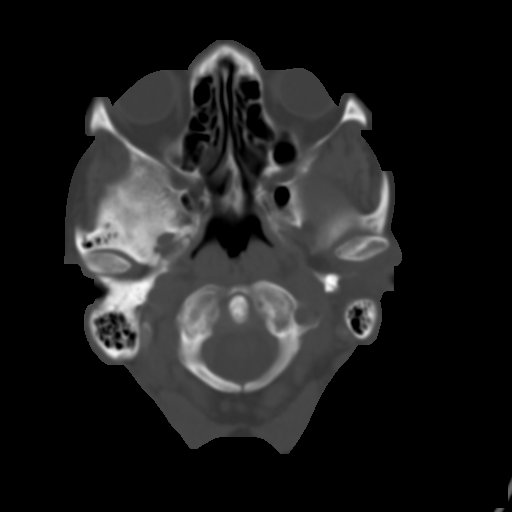
[im 8/32  brain]
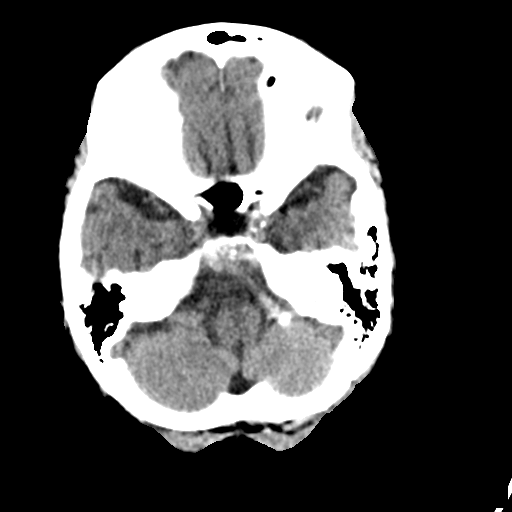
[im 12/32  brain]
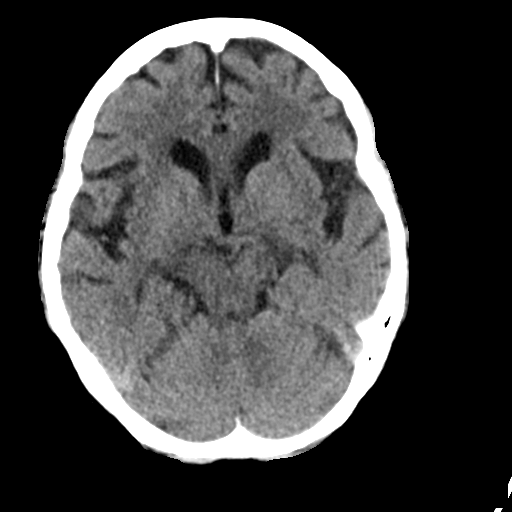
[im 16/32  brain]
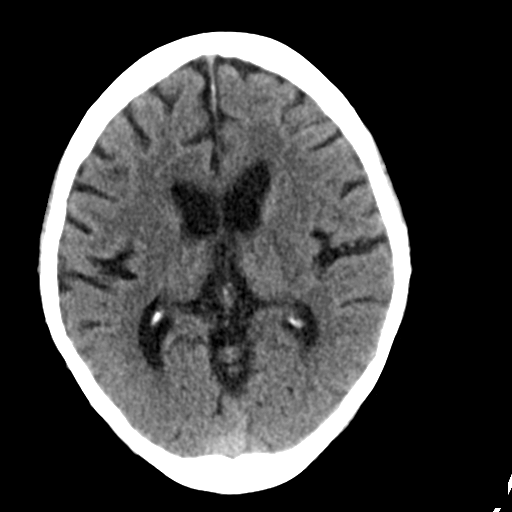
[im 20/32  brain]
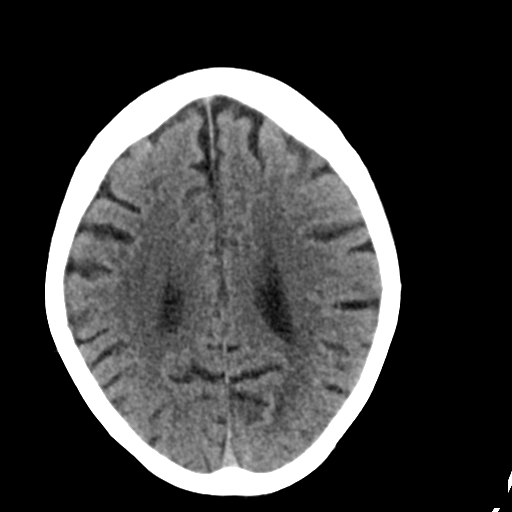
[im 20/32  bone]
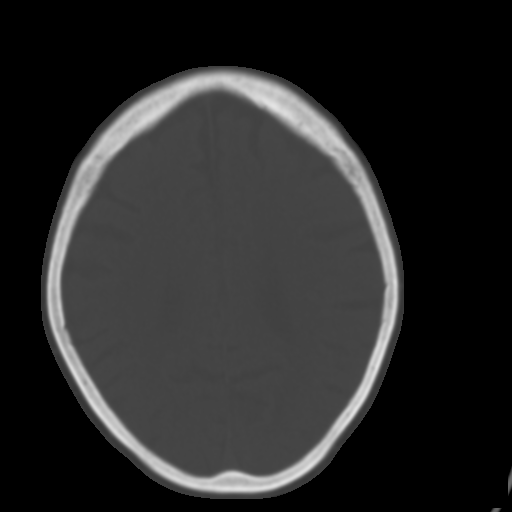
[im 24/32  brain]
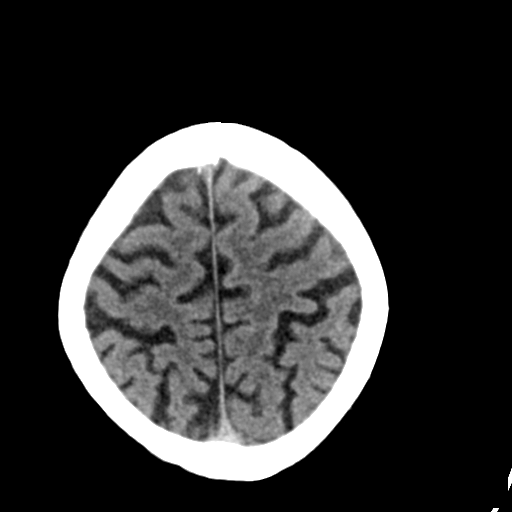
[im 28/32  brain]
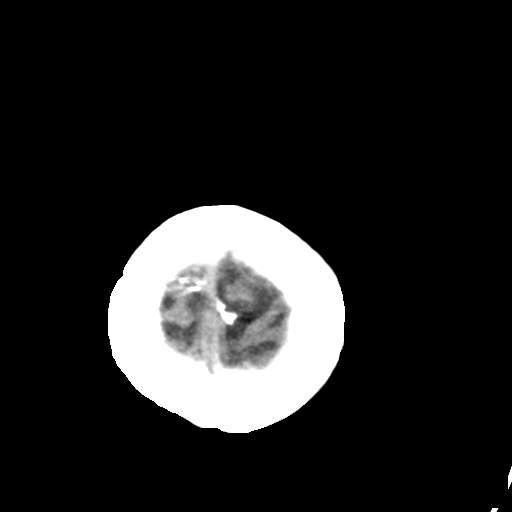

[Series 4: head bone · axial · 0.39mm/px · z∈[-152,-120]mm · 3 of 79 slices shown]
[im 8/79  bone]
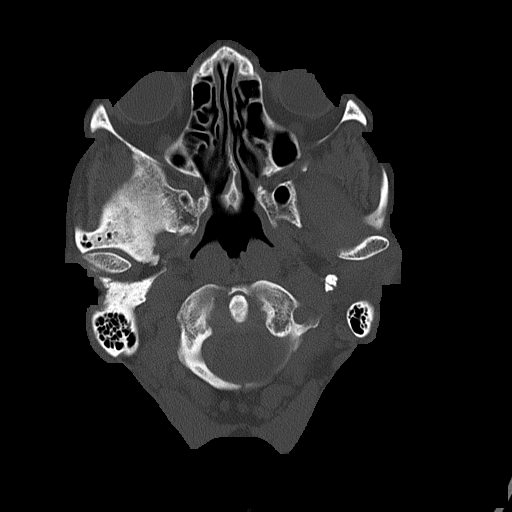
[im 16/79  bone]
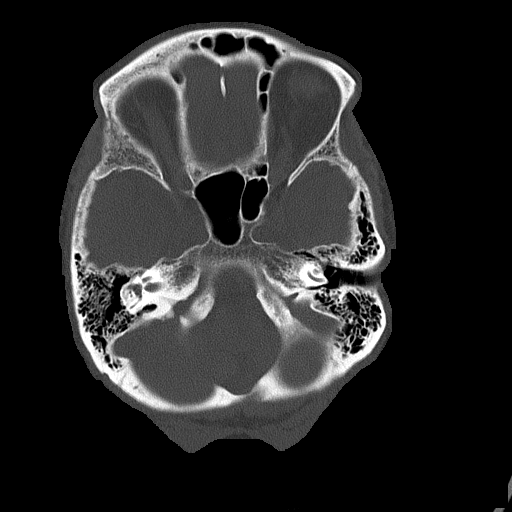
[im 24/79  bone]
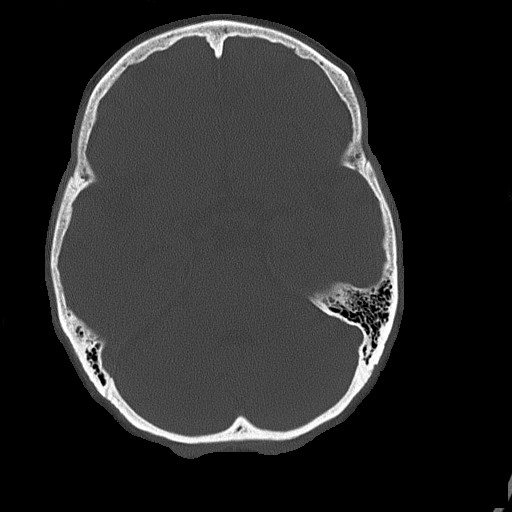

[Series 5: head without cor · coronal · non-contrast · 0.31mm/px · 3 of 64 slices shown]
[im 22/64  brain]
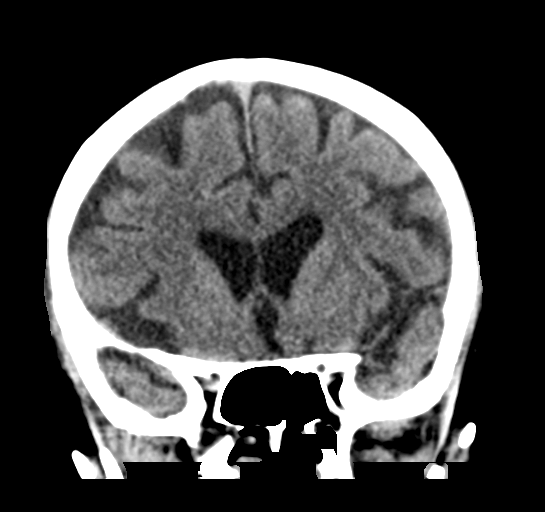
[im 29/64  brain]
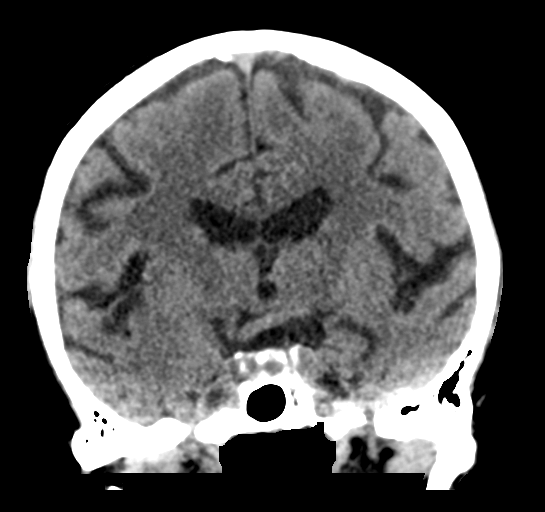
[im 36/64  brain]
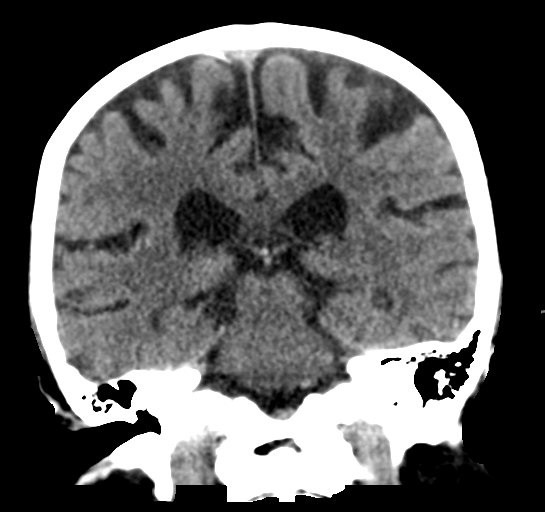

[Series 6: head without sag · sagittal · non-contrast · 0.31mm/px · 3 of 61 slices shown]
[im 21/61  brain]
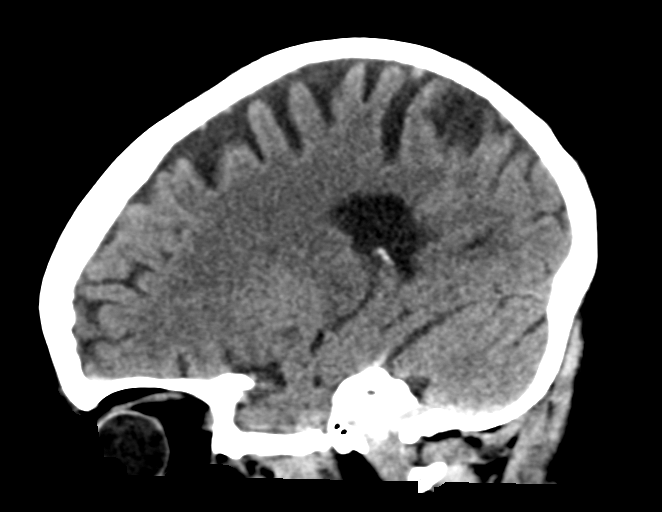
[im 31/61  brain]
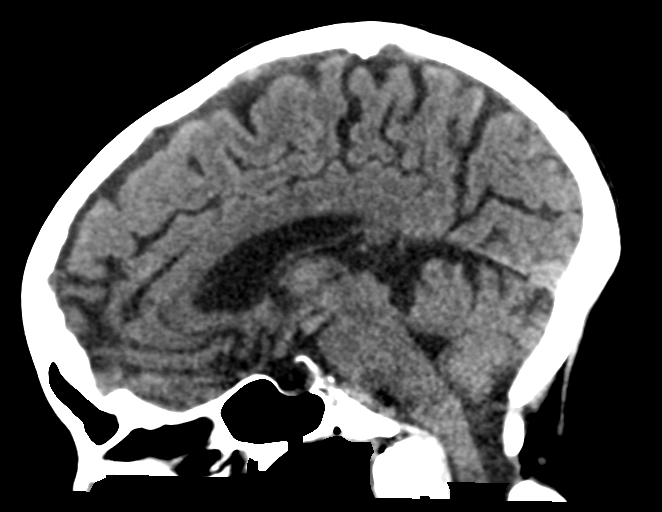
[im 41/61  brain]
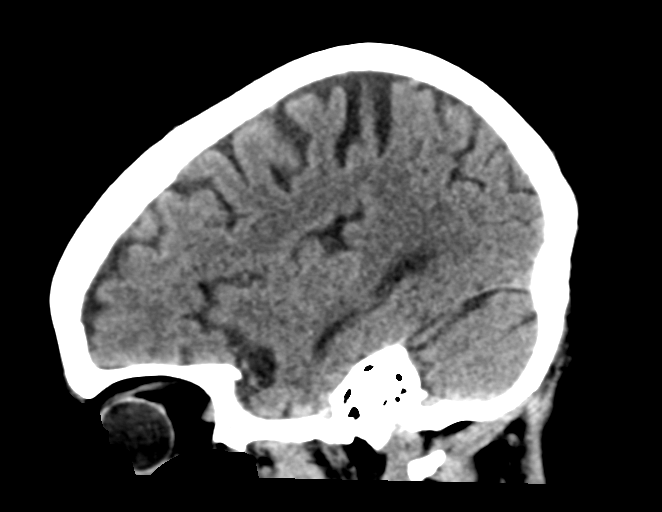

[16 of 47 positions shown; findings below may reference images not displayed]

FINDINGS: Brain: No acute intracranial hemorrhage. No focal mass lesion. No CT
evidence of acute infarction. No midline shift or mass effect. No
hydrocephalus. Basilar cisterns are patent.

There are periventricular and subcortical white matter
hypodensities. Generalized cortical atrophy.

Vascular: No hyperdense vessel or unexpected calcification.

Skull: Normal. Negative for fracture or focal lesion.

Sinuses/Orbits: Paranasal sinuses and mastoid air cells are clear.
Orbits are clear.

Other: 8
IMPRESSION: No acute intracranial findings.

Mild atrophy and white matter microvascular disease.

## 2019-05-21 ENCOUNTER — Ambulatory Visit: Payer: Medicare Other | Admitting: Psychology

## 2019-05-24 ENCOUNTER — Ambulatory Visit (INDEPENDENT_AMBULATORY_CARE_PROVIDER_SITE_OTHER): Payer: Medicare Other | Admitting: *Deleted

## 2019-05-24 DIAGNOSIS — I480 Paroxysmal atrial fibrillation: Secondary | ICD-10-CM

## 2019-05-24 LAB — CUP PACEART REMOTE DEVICE CHECK
Battery Impedance: 9245 Ohm
Battery Voltage: 2.59 V
Brady Statistic RV Percent Paced: 6 %
Date Time Interrogation Session: 20210128102119
Implantable Lead Implant Date: 20090527
Implantable Lead Implant Date: 20090527
Implantable Lead Location: 753859
Implantable Lead Location: 753860
Implantable Lead Model: 5076
Implantable Lead Model: 5076
Implantable Pulse Generator Implant Date: 20090527
Lead Channel Impedance Value: 494 Ohm
Lead Channel Impedance Value: 67 Ohm
Lead Channel Setting Pacing Amplitude: 2.5 V
Lead Channel Setting Pacing Pulse Width: 0.4 ms
Lead Channel Setting Sensing Sensitivity: 2 mV

## 2019-05-24 NOTE — Progress Notes (Signed)
PPM Remote  

## 2019-05-29 ENCOUNTER — Other Ambulatory Visit (HOSPITAL_COMMUNITY): Payer: Self-pay | Admitting: *Deleted

## 2019-05-29 DIAGNOSIS — F330 Major depressive disorder, recurrent, mild: Secondary | ICD-10-CM

## 2019-05-29 DIAGNOSIS — F411 Generalized anxiety disorder: Secondary | ICD-10-CM

## 2019-05-30 ENCOUNTER — Telehealth: Payer: Self-pay | Admitting: Internal Medicine

## 2019-05-30 MED ORDER — APIXABAN 5 MG PO TABS: 5.0000 mg | ORAL_TABLET | Freq: Two times a day (BID) | ORAL | 1 refills | Status: DC

## 2019-05-30 MED ORDER — SYNTHROID 100 MCG PO TABS: 100.0000 ug | ORAL_TABLET | Freq: Every day | ORAL | 1 refills | Status: DC

## 2019-05-30 MED ORDER — TOLTERODINE TARTRATE ER 2 MG PO CP24: 2.0000 mg | ORAL_CAPSULE | Freq: Every day | ORAL | 1 refills | Status: DC

## 2019-05-30 MED ORDER — SPIRONOLACTONE 25 MG PO TABS: 25.0000 mg | ORAL_TABLET | Freq: Every day | ORAL | 1 refills | Status: DC

## 2019-05-30 NOTE — Telephone Encounter (Signed)
   Patient requesting medication refill      1. Which medications need to be refilled? (please list name of each medication and dose if known)  SYNTHROID 100 MCG tablet ELIQUIS 5 MG TABS tablet tolterodine (DETROL LA) 2 MG 24 hr capsule spironolactone (ALDACTONE) 25 MG tablet    2. Which pharmacy/location (including street and city if local pharmacy) is medication to be sent to? [facility] , phone [phone]  3. Do they need a 30 day or 90 day supply? [place]

## 2019-05-30 NOTE — Telephone Encounter (Signed)
rx sent

## 2019-06-04 ENCOUNTER — Ambulatory Visit: Payer: Medicare Other | Admitting: Psychology

## 2019-06-06 ENCOUNTER — Telehealth: Payer: Self-pay

## 2019-06-06 MED ORDER — SYNTHROID 100 MCG PO TABS: 100.0000 ug | ORAL_TABLET | Freq: Every day | ORAL | 1 refills | Status: DC

## 2019-06-06 MED ORDER — SPIRONOLACTONE 25 MG PO TABS: 25.0000 mg | ORAL_TABLET | Freq: Every day | ORAL | 1 refills | Status: DC

## 2019-06-06 MED ORDER — APIXABAN 5 MG PO TABS: 5.0000 mg | ORAL_TABLET | Freq: Two times a day (BID) | ORAL | 1 refills | Status: DC

## 2019-06-06 MED ORDER — TOLTERODINE TARTRATE ER 2 MG PO CP24: 2.0000 mg | ORAL_CAPSULE | Freq: Every day | ORAL | 1 refills | Status: DC

## 2019-06-06 NOTE — Telephone Encounter (Signed)
Pt called states pharmacy said they have not received scripts for [place]. Please fax again to pharmacy on file.  Pt states they will mail to current address for her once scripts are received.  Pt contact phone number currently [phone].

## 2019-06-06 NOTE — Telephone Encounter (Signed)
New message     1. Which medications need to be refilled? (please list name of each medication and dose if known) tolterodine (DETROL LA) 2 MG 24 hr capsule  2. Which pharmacy/location (including street and city if local pharmacy) is medication to be sent to? [facility]  [phone]   3. Do they need a 30 day or 90 day supply? 30 days supply

## 2019-06-06 NOTE — Telephone Encounter (Signed)
RX sent [facility]

## 2019-06-11 ENCOUNTER — Telehealth: Payer: Self-pay

## 2019-06-11 NOTE — Telephone Encounter (Signed)
New message   formulary question please return call back   1. Which medications need to be refilled? (please list name of each medication and dose if known) SYNTHROID 100 MCG tablet  2. Which pharmacy/location (including street and city if local pharmacy) is medication to be sent to? [facility]   Ref # [id number]

## 2019-06-12 ENCOUNTER — Telehealth: Payer: Self-pay

## 2019-06-12 ENCOUNTER — Other Ambulatory Visit (HOSPITAL_COMMUNITY): Payer: Self-pay | Admitting: *Deleted

## 2019-06-12 DIAGNOSIS — F330 Major depressive disorder, recurrent, mild: Secondary | ICD-10-CM

## 2019-06-12 MED ORDER — SYNTHROID 100 MCG PO TABS: 100.0000 ug | ORAL_TABLET | Freq: Every day | ORAL | 1 refills | Status: DC

## 2019-06-12 MED ORDER — LORAZEPAM 0.5 MG PO TABS: ORAL_TABLET | ORAL | 1 refills | Status: DC

## 2019-06-12 NOTE — Telephone Encounter (Signed)
Medication Requested:  spironolactone (ALDACTONE) 25 MG tablet  SYNTHROID 100 MCG tablet  tolterodine (DETROL LA) 2 MG 24 hr capsule  Is medication on med list: Yes  (if no, inform pt they may need an appointment)  Is medication a controled: No  (yes = last OV with PCP)  -Controlled Substances: Adderall, Ritalin, oxycodone, hydrocodone, methadone, alprazolam, etc  Last visit with PCP: 02/22/19   Is the OV > than 4 months: (yes = schedule an appt if one is not already made)  Pharmacy (Name, Street, [place]): [facility] [phone]

## 2019-06-13 ENCOUNTER — Telehealth: Payer: Self-pay

## 2019-06-13 NOTE — Telephone Encounter (Signed)
Authorizing Provider: Kathlee Nations, MD dills this medication and sent in RX yesterday

## 2019-06-13 NOTE — Telephone Encounter (Signed)
[facility]/06/19  Per [facility], [facility] Phone number:[phone]

## 2019-06-13 NOTE — Telephone Encounter (Signed)
Medication Requested: LORazepam (ATIVAN) 0.5 MG tablet  Is medication on med list: Yes  (if no, inform pt they may need an appointment)  Is medication a controled: Yes  (yes = last OV with PCP)  -Controlled Substances: Adderall, Ritalin, oxycodone, hydrocodone, methadone, alprazolam, etc  Last visit with PCP: Dr. Adele Schilder - Patient stated Dr. Alain Marion had called in this medication before   Is the OV > than 4 months: (yes = schedule an appt if one is not already made)  Pharmacy (Name, Street, [place]): [facility] [phone] ext [phone]

## 2019-06-18 ENCOUNTER — Encounter (HOSPITAL_COMMUNITY): Payer: Self-pay | Admitting: Psychiatry

## 2019-06-18 ENCOUNTER — Other Ambulatory Visit: Payer: Self-pay

## 2019-06-18 ENCOUNTER — Ambulatory Visit (INDEPENDENT_AMBULATORY_CARE_PROVIDER_SITE_OTHER): Payer: Medicare Other | Admitting: Psychology

## 2019-06-18 ENCOUNTER — Ambulatory Visit (INDEPENDENT_AMBULATORY_CARE_PROVIDER_SITE_OTHER): Payer: Medicare Other | Admitting: Psychiatry

## 2019-06-18 DIAGNOSIS — F411 Generalized anxiety disorder: Secondary | ICD-10-CM

## 2019-06-18 DIAGNOSIS — F330 Major depressive disorder, recurrent, mild: Secondary | ICD-10-CM

## 2019-06-18 DIAGNOSIS — F332 Major depressive disorder, recurrent severe without psychotic features: Secondary | ICD-10-CM

## 2019-06-18 MED ORDER — ESCITALOPRAM OXALATE 10 MG PO TABS: ORAL_TABLET | ORAL | 0 refills | Status: DC

## 2019-06-18 MED ORDER — LORAZEPAM 0.5 MG PO TABS: ORAL_TABLET | ORAL | 0 refills | Status: DC

## 2019-06-18 NOTE — Progress Notes (Signed)
Virtual Visit via Telephone Note  I connected with [name] on 06/18/19 at  2:00 PM EST by telephone and verified that I am speaking with the correct person using two identifiers.   I discussed the limitations, risks, security and privacy concerns of performing an evaluation and management service by telephone and the availability of in person appointments. I also discussed with the patient that there may be a patient responsible charge related to this service. The patient expressed understanding and agreed to proceed.   History of Present Illness: Patient was evaluated by phone session.  She admitted having some trouble getting her prescription and not she had decided to use [facility] for refills.  Patient told her daughter works early in the morning and she did not have a time to pick up the medication.  She does not have transportation and she is dependent on her daughter.  She admitted some time dysphoria and frustrated because she does not have freedom but she also feels that she is safe at her daughter's house.  She started therapy with Dr. Cheryln Manly virtually.  She feels the medicine working.  She denies any crying spells, feeling of hopelessness or worthlessness.  Her appetite is okay.  She is not happy because she is vegan but her daughter eats meat.  She has ruminative thoughts about her living situation but again she feels safe and does not want to change her living situation.  She denies any suicidal thoughts.  She like to continue lorazepam and Lexapro.  She has no tremors, shakes or any EPS.   Past Psychiatric History:Reviewed. H/O one inpatient due to suicidal thinking. No h/o suicidal attempt, mania or psychosis.    Psychiatric Specialty Exam: Physical Exam  Review of Systems  There were no vitals taken for this visit.There is no height or weight on file to calculate BMI.  General Appearance: NA  Eye Contact:  NA  Speech:  Clear and Coherent and Slow  Volume:   Normal  Mood:  Dysphoric  Affect:  NA  Thought Process:  Goal Directed  Orientation:  Full (Time, Place, and Person)  Thought Content:  Rumination  Suicidal Thoughts:  No  Homicidal Thoughts:  No  Memory:  Immediate;   Good Recent;   Fair Remote;   Fair  Judgement:  Intact  Insight:  Present  Psychomotor Activity:  NA  Concentration:  Concentration: Fair and Attention Span: Fair  Recall:  [employer] of Knowledge:  Fair  Language:  Good  Akathisia:  No  Handed:  Right  AIMS (if indicated):     Assets:  Communication Skills Desire for Improvement Housing Social Support  ADL's:  Intact  Cognition:  WNL  Sleep:         Assessment and Plan: Major depressive disorder, recurrent.  Generalized anxiety disorder.  Reassurance given.  Patient had started therapy with Dr. Cheryln Manly virtually.  She like to have her prescription sent to [facility].  I will continue Lexapro 10 mg and lorazepam 0.5 mg as needed for anxiety and we will send her refills to [facility] for 90 days.  Recommended to call us back if she has any question or any concern.  Follow-up in 3 months.  Follow Up Instructions:    I discussed the assessment and treatment plan with the patient. The patient was provided an opportunity to ask questions and all were answered. The patient agreed with the plan and demonstrated an understanding of the instructions.   The patient  was advised to call back or seek an in-person evaluation if the symptoms worsen or if the condition fails to improve as anticipated.  I provided 20 minutes of non-face-to-face time during this encounter.   Kathlee Nations, MD

## 2019-06-25 ENCOUNTER — Ambulatory Visit (INDEPENDENT_AMBULATORY_CARE_PROVIDER_SITE_OTHER): Payer: Medicare Other | Admitting: *Deleted

## 2019-06-25 ENCOUNTER — Telehealth: Payer: Self-pay

## 2019-06-25 DIAGNOSIS — Z950 Presence of cardiac pacemaker: Secondary | ICD-10-CM | POA: Diagnosis not present

## 2019-06-25 LAB — CUP PACEART REMOTE DEVICE CHECK
Battery Impedance: 10378 Ohm
Battery Voltage: 2.57 V
Brady Statistic RV Percent Paced: 6 %
Date Time Interrogation Session: 20210301105031
Implantable Lead Implant Date: 20090527
Implantable Lead Implant Date: 20090527
Implantable Lead Location: 753859
Implantable Lead Location: 753860
Implantable Lead Model: 5076
Implantable Lead Model: 5076
Implantable Pulse Generator Implant Date: 20090527
Lead Channel Impedance Value: 513 Ohm
Lead Channel Impedance Value: 67 Ohm
Lead Channel Setting Pacing Amplitude: 2.5 V
Lead Channel Setting Pacing Pulse Width: 0.4 ms
Lead Channel Setting Sensing Sensitivity: 2 mV

## 2019-06-25 NOTE — Progress Notes (Signed)
PPM Remote  

## 2019-06-25 NOTE — Telephone Encounter (Signed)
PM alert received- Battery at RRT as of 04/24/19, pt is 5.9% VP.  Spoke with pt, advised of need for appt to discuss replacement.

## 2019-06-26 NOTE — Telephone Encounter (Signed)
Thanks SK   

## 2019-06-28 ENCOUNTER — Telehealth (INDEPENDENT_AMBULATORY_CARE_PROVIDER_SITE_OTHER): Payer: Medicare Other | Admitting: Internal Medicine

## 2019-06-28 ENCOUNTER — Other Ambulatory Visit: Payer: Self-pay

## 2019-06-28 VITALS — BP 131/90 | HR 65 | Ht 64.0 in | Wt 149.0 lb

## 2019-06-28 DIAGNOSIS — I4891 Unspecified atrial fibrillation: Secondary | ICD-10-CM | POA: Diagnosis not present

## 2019-06-28 DIAGNOSIS — Z01812 Encounter for preprocedural laboratory examination: Secondary | ICD-10-CM | POA: Diagnosis not present

## 2019-06-28 DIAGNOSIS — Z950 Presence of cardiac pacemaker: Secondary | ICD-10-CM

## 2019-06-28 DIAGNOSIS — I495 Sick sinus syndrome: Secondary | ICD-10-CM | POA: Diagnosis not present

## 2019-06-28 NOTE — Patient Instructions (Addendum)
Message left on pt's voicemail advising instructions have been released to my chart.  See Instruction letter for Pacemaker Generator Change for details  Medication Instructions:  Your physician recommends that you continue on your current medications as directed. Please refer to the Current Medication list given to you today.  Labwork: CBC and BMET  Testing/Procedures: Pacemaker Generator change scheduled for Monday, 07/16/2019.  Arrive at the [facility] at 2pm.  Follow-Up:Dr Klein's scheduler will call you to schedule your appointments following your generator change.  Any Other Special Instructions Will Be Listed Below (If Applicable).  If you need a refill on your cardiac medications before your next appointment, please call your pharmacy.

## 2019-06-28 NOTE — H&P (View-Only) (Signed)
Electrophysiology TeleHealth Note   Due to national recommendations of social distancing due to COVID 19, an audio/video telehealth visit is felt to be most appropriate for this patient at this time.  See MyChart message from today for the patient's consent to telehealth for [facility].   Date:  06/28/2019   ID:  [name], DOB Feb 19, 1930, MRN [id number]  Location: patient's home  Provider location: [address] [place]  Evaluation Performed: Follow-up visit  PCP:  Plotnikov, Evie Lacks, MD  Cardiologist:     Electrophysiologist:  SK   Chief Complaint:  Sinus node dysfunction  History of Present Illness:    [name] is a 86 y.o. female who presents via audio/video conferencing for a telehealth visit today.  Since last being seen in our clinic for pacemaker now at [facility]  in setting of sinus node dysfunction and normal LV function and orthostatic intolerance , the patient is now with her daughter and not at [facility] reports doing pretty well     Date Cr K Hgb  12/19 1.18 4.5 12.5  3/20 1.24 4.6 13.7           The patient denies symptoms of fevers, chills, cough, or new SOB worrisome for COVID 19.but has been reluctant to ghet the vaccine  Past Medical History:  Diagnosis Date  . Anxiety   . Bradycardia   . Breast cancer, left breast ([place]) 1995  . Breast cancer, right breast ([place]) 2002  . CHF (congestive heart failure) ([place])   . Depression    Dr Cheryln Manly  . Discoid lupus    skin  . Full dentures   . GERD (gastroesophageal reflux disease)   . Glaucoma   . Gout   . Hx of echocardiogram    a.  Echocardiogram (03/2011): Mild LVH, EF 0000000, grade 1 diastolic dysfunction, mild MR, mild to moderate TR, PASP 42;  b. Echo (05/2013):  Mod LVH, EF 65-70%, no WMA, Gr 1 DD, mild MR, mod TR, PASP 39  . Hx of exercise stress test    a. ETT-echocardiogram (06/08/11): Sub-optimal exercise. Test stopped early due to dizziness and hypotension. EF 60%.   Non-diagnostic.;   b. Lexiscan Myoview (06/2013):  Diaphragmatic attenuation, no ischemia, EF 61%.  Low Risk  . Hypertension   . Hypothyroidism   . IBS (irritable bowel syndrome)   . Incontinence   . OSA (obstructive sleep apnea) 07/20/2016   Mild with AHI 12/hr now on CPAP  . Osteoarthritis   . Osteopenia   . Pacemaker 2009   Brady//Chronotropic incompetence with normal pacemaker function  . Thyroid disease   . Wears hearing aid    both ears    Past Surgical History:  Procedure Laterality Date  . BREAST BIOPSY Left 1995  . BREAST LUMPECTOMY Left 1995   axillary node dissectoin  . CARDIAC CATHETERIZATION N/A 02/13/2016   Procedure: Right Heart Cath;  Surgeon: Larey Dresser, MD;  Location: [place] [facility];  Service: Cardiovascular;  Laterality: N/A;  . CATARACT EXTRACTION W/ INTRAOCULAR LENS  IMPLANT, BILATERAL Bilateral 06/2009 - 12.2915   left - right  . DORSAL COMPARTMENT RELEASE  2000   left  . HAMMER TOE SURGERY Left 2004  . HEMORRHOID BANDING    . INSERT / REPLACE / REMOVE PACEMAKER  2009  . KNEE ARTHROSCOPY Left 08/29/2012   Procedure: LEFT KNEE ARTHROSCOPY ;  Surgeon: Hessie Dibble, MD;  Location: [place];  Service: Orthopedics;  Laterality: Left;  [name] MASTECTOMY Bilateral 09/2000   nbx  . TRIGGER FINGER RELEASE Right 07/31/2013   Procedure: EXCISE MASS RIGHT INDEX A-1 PLLLEY RELEASE A-1 RIGHT INDEX;  Surgeon: Cammie Sickle., MD;  Location: [place];  Service: Orthopedics;  Laterality: Right;  [name] VAGINAL HYSTERECTOMY      Current Outpatient Medications  Medication Sig Dispense Refill  . apixaban (ELIQUIS) 5 MG TABS tablet Take 1 tablet (5 mg total) by mouth 2 (two) times daily. 180 tablet 1  . cetirizine (ZYRTEC) 10 MG tablet Take 10 mg by mouth daily as needed for allergies.    [name] escitalopram (LEXAPRO) 10 MG tablet TAKE 1 TABLET (10 MG) BY MOUTH DAILY AT BEDTIME. 90 tablet 0  . famotidine (PEPCID) 10 MG tablet Take 10 mg by  mouth 2 (two) times daily.    [name] LORazepam (ATIVAN) 0.5 MG tablet Take 1 tab daily as needed for anxiety 90 tablet 0  . magnesium oxide (MAG-OX) 400 MG tablet TAKE 1/2 TABLET BY MOUTH EVERY DAY 45 tablet 0  . REFRESH TEARS 0.5 % SOLN Place 1 drop into both eyes daily as needed (dry eyes).   0  . spironolactone (ALDACTONE) 25 MG tablet Take 1 tablet (25 mg total) by mouth daily. 90 tablet 1  . SYNTHROID 100 MCG tablet Take 1 tablet (100 mcg total) by mouth daily. 90 tablet 1  . tolterodine (DETROL LA) 2 MG 24 hr capsule Take 1 capsule (2 mg total) by mouth daily. 90 capsule 1  . famotidine (PEPCID) 40 MG tablet Take 1 tablet (40 mg total) by mouth daily. 90 tablet 3  . Magnesium Oxide 200 MG TABS Take 1 tablet (200 mg total) by mouth daily. 90 tablet 3   No current facility-administered medications for this visit.    Allergies:   Lactase, Amlodipine besy-benazepril hcl, Amlodipine besylate, Clonidine hydrochloride, Lactose intolerance (gi), Valsartan, and Verapamil   Social History:  The patient  reports that she has never smoked. She has never used smokeless tobacco. She reports that she does not drink alcohol or use drugs.   Family History:  The patient's   family history includes Cancer in her brother; Heart disease in her brother and father; Ovarian cancer in her mother; Stroke in her brother and sister.   ROS:  Please see the history of present illness.   All other systems are personally reviewed and negative.    Exam:    Vital Signs:  BP 131/90   Pulse 65   Ht 5\' 4"  (1.626 m)   Wt 149 lb (67.6 kg)   BMI 25.58 kg/m   k   Labs/Other Tests and Data Reviewed:    Recent Labs: 07/07/2018: ALT 10; BUN 25; Creatinine 1.27; Potassium 4.6; Sodium 140   Wt Readings from Last 3 Encounters:  06/28/19 149 lb (67.6 kg)  05/01/19 156 lb (70.8 kg)  01/08/19 166 lb (75.3 kg)     Other studies personally reviewed: Additional studies/ records that were reviewed today    Last device  remote is reviewed from [place] PDF dated 3/21 which reveals device is at [place]:   Sinus node dysfunction   Pacemaker  Medtronic @ [facility]   Hypertension  Dyspnea-multifactorial  Depression  Orthostatic lightheadedness  DVT PE *  Device at [facility]  We have reviewed the benefits and risks of generator replacement.  These include but are not limited to lead fracture and infection.  The patient understands, agrees  and is willing to proceed.     Dyspnea is at baseline  BP borderline elevated  Reviewed data on Vaccine and that AA thought leaders in the medical community have been promoting the vaccine  On Anticoagulation;  No bleeding issues   orthostasis stable    COVID 19 screen The patient denies symptoms of COVID 19 at this time.  The importance of social distancing was discussed today.  Follow-up:  68m Next remote: * As Scheduled   Current medicines are reviewed at length with the patient today.   The patient does not have concerns regarding her medicines.  The following changes were made today:  none  Labs/ tests ordered today include:   No orders of the defined types were placed in this encounter.   Future tests ( post COVID )    Pt needs Pacemaker generator replacement during this month  Patient Risk:  after full review of this patients clinical status, I feel that they are at moderate risk at this time.  Today, I have spent 16 minutes with the patient with telehealth technology discussing the above.  Signed, Virl Axe, MD  06/28/2019 3:59 PM     [address] [place] [address] [phone] (office) [phone] (fax)

## 2019-06-28 NOTE — Progress Notes (Signed)
Electrophysiology TeleHealth Note   Due to national recommendations of social distancing due to COVID 19, an audio/video telehealth visit is felt to be most appropriate for this patient at this time.  See MyChart message from today for the patient's consent to telehealth for [facility].   Date:  06/28/2019   ID:  [name], DOB 01-20-1930, MRN [id number]  Location: patient's home  Provider location: [address], [place]  Evaluation Performed: Follow-up visit  PCP:  Plotnikov, Evie Lacks, MD  Cardiologist:     Electrophysiologist:  SK   Chief Complaint:  Sinus node dysfunction  History of Present Illness:    [name] is a 86 y.o. female who presents via audio/video conferencing for a telehealth visit today.  Since last being seen in our clinic for pacemaker now at [facility]  in setting of sinus node dysfunction and normal LV function and orthostatic intolerance , the patient is now with her daughter and not at [facility] reports doing pretty well     Date Cr K Hgb  12/19 1.18 4.5 12.5  3/20 1.24 4.6 13.7           The patient denies symptoms of fevers, chills, cough, or new SOB worrisome for COVID 19.but has been reluctant to ghet the vaccine  Past Medical History:  Diagnosis Date  . Anxiety   . Bradycardia   . Breast cancer, left breast ([place]) 1995  . Breast cancer, right breast ([place]) 2002  . CHF (congestive heart failure) ([place])   . Depression    Dr [name]  . Discoid lupus    skin  . Full dentures   . GERD (gastroesophageal reflux disease)   . Glaucoma   . Gout   . Hx of echocardiogram    a.  Echocardiogram (03/2011): Mild LVH, EF 0000000, grade 1 diastolic dysfunction, mild MR, mild to moderate TR, PASP 42;  b. Echo (05/2013):  Mod LVH, EF 65-70%, no WMA, Gr 1 DD, mild MR, mod TR, PASP 39  . Hx of exercise stress test    a. ETT-echocardiogram (06/08/11): Sub-optimal exercise. Test stopped early due to dizziness and hypotension. EF 60%.   Non-diagnostic.;   b. Lexiscan Myoview (06/2013):  Diaphragmatic attenuation, no ischemia, EF 61%.  Low Risk  . Hypertension   . Hypothyroidism   . IBS (irritable bowel syndrome)   . Incontinence   . OSA (obstructive sleep apnea) 07/20/2016   Mild with AHI 12/hr now on CPAP  . Osteoarthritis   . Osteopenia   . Pacemaker 2009   Brady//Chronotropic incompetence with normal pacemaker function  . Thyroid disease   . Wears hearing aid    both ears    Past Surgical History:  Procedure Laterality Date  . BREAST BIOPSY Left 1995  . BREAST LUMPECTOMY Left 1995   axillary node dissectoin  . CARDIAC CATHETERIZATION N/A 02/13/2016   Procedure: Right Heart Cath;  Surgeon: Larey Dresser, MD;  Location: [place] [facility];  Service: Cardiovascular;  Laterality: N/A;  . CATARACT EXTRACTION W/ INTRAOCULAR LENS  IMPLANT, BILATERAL Bilateral 06/2009 - 12.2915   left - right  . DORSAL COMPARTMENT RELEASE  2000   left  . HAMMER TOE SURGERY Left 2004  . HEMORRHOID BANDING    . INSERT / REPLACE / REMOVE PACEMAKER  2009  . KNEE ARTHROSCOPY Left 08/29/2012   Procedure: LEFT KNEE ARTHROSCOPY ;  Surgeon: Hessie Dibble, MD;  Location: [place];  Service: Orthopedics;  Laterality: Left;  [name] MASTECTOMY Bilateral 09/2000   nbx  . TRIGGER FINGER RELEASE Right 07/31/2013   Procedure: EXCISE MASS RIGHT INDEX A-1 PLLLEY RELEASE A-1 RIGHT INDEX;  Surgeon: Cammie Sickle., MD;  Location: [place];  Service: Orthopedics;  Laterality: Right;  [name] VAGINAL HYSTERECTOMY      Current Outpatient Medications  Medication Sig Dispense Refill  . apixaban (ELIQUIS) 5 MG TABS tablet Take 1 tablet (5 mg total) by mouth 2 (two) times daily. 180 tablet 1  . cetirizine (ZYRTEC) 10 MG tablet Take 10 mg by mouth daily as needed for allergies.    [name] escitalopram (LEXAPRO) 10 MG tablet TAKE 1 TABLET (10 MG) BY MOUTH DAILY AT BEDTIME. 90 tablet 0  . famotidine (PEPCID) 10 MG tablet Take 10 mg by  mouth 2 (two) times daily.    [name] LORazepam (ATIVAN) 0.5 MG tablet Take 1 tab daily as needed for anxiety 90 tablet 0  . magnesium oxide (MAG-OX) 400 MG tablet TAKE 1/2 TABLET BY MOUTH EVERY DAY 45 tablet 0  . REFRESH TEARS 0.5 % SOLN Place 1 drop into both eyes daily as needed (dry eyes).   0  . spironolactone (ALDACTONE) 25 MG tablet Take 1 tablet (25 mg total) by mouth daily. 90 tablet 1  . SYNTHROID 100 MCG tablet Take 1 tablet (100 mcg total) by mouth daily. 90 tablet 1  . tolterodine (DETROL LA) 2 MG 24 hr capsule Take 1 capsule (2 mg total) by mouth daily. 90 capsule 1  . famotidine (PEPCID) 40 MG tablet Take 1 tablet (40 mg total) by mouth daily. 90 tablet 3  . Magnesium Oxide 200 MG TABS Take 1 tablet (200 mg total) by mouth daily. 90 tablet 3   No current facility-administered medications for this visit.    Allergies:   Lactase, Amlodipine besy-benazepril hcl, Amlodipine besylate, Clonidine hydrochloride, Lactose intolerance (gi), Valsartan, and Verapamil   Social History:  The patient  reports that she has never smoked. She has never used smokeless tobacco. She reports that she does not drink alcohol or use drugs.   Family History:  The patient's   family history includes Cancer in her brother; Heart disease in her brother and father; Ovarian cancer in her mother; Stroke in her brother and sister.   ROS:  Please see the history of present illness.   All other systems are personally reviewed and negative.    Exam:    Vital Signs:  BP 131/90   Pulse 65   Ht 5\' 4"  (1.626 m)   Wt 149 lb (67.6 kg)   BMI 25.58 kg/m   k   Labs/Other Tests and Data Reviewed:    Recent Labs: 07/07/2018: ALT 10; BUN 25; Creatinine 1.27; Potassium 4.6; Sodium 140   Wt Readings from Last 3 Encounters:  06/28/19 149 lb (67.6 kg)  05/01/19 156 lb (70.8 kg)  01/08/19 166 lb (75.3 kg)     Other studies personally reviewed: Additional studies/ records that were reviewed today    Last device  remote is reviewed from [place] PDF dated 3/21 which reveals device is at [place]:   Sinus node dysfunction   Pacemaker  Medtronic @ [facility]   Hypertension  Dyspnea-multifactorial  Depression  Orthostatic lightheadedness  DVT PE *  Device at [facility]  We have reviewed the benefits and risks of generator replacement.  These include but are not limited to lead fracture and infection.  The patient understands, agrees  and is willing to proceed.     Dyspnea is at baseline  BP borderline elevated  Reviewed data on Vaccine and that AA thought leaders in the medical community have been promoting the vaccine  On Anticoagulation;  No bleeding issues   orthostasis stable    COVID 19 screen The patient denies symptoms of COVID 19 at this time.  The importance of social distancing was discussed today.  Follow-up:  89m Next remote: * As Scheduled   Current medicines are reviewed at length with the patient today.   The patient does not have concerns regarding her medicines.  The following changes were made today:  none  Labs/ tests ordered today include:   No orders of the defined types were placed in this encounter.   Future tests ( post COVID )    Pt needs Pacemaker generator replacement during this month  Patient Risk:  after full review of this patients clinical status, I feel that they are at moderate risk at this time.  Today, I have spent 16 minutes with the patient with telehealth technology discussing the above.  Signed, Virl Axe, MD  06/28/2019 3:59 PM     [address] [place] [address] [phone] (office) [phone] (fax)

## 2019-06-29 ENCOUNTER — Telehealth: Payer: Self-pay | Admitting: Internal Medicine

## 2019-06-29 NOTE — Telephone Encounter (Signed)
Patient states in her last office visit paper work she did not see anything about her blood clot that she had in October 2020. She says she had a blood clot in her leg that went to her lung and wants to make sure Dr. Caryl Comes is aware of it.

## 2019-07-02 ENCOUNTER — Ambulatory Visit (INDEPENDENT_AMBULATORY_CARE_PROVIDER_SITE_OTHER): Payer: Medicare Other | Admitting: Psychology

## 2019-07-02 DIAGNOSIS — F331 Major depressive disorder, recurrent, moderate: Secondary | ICD-10-CM | POA: Diagnosis not present

## 2019-07-03 ENCOUNTER — Telehealth: Payer: Self-pay | Admitting: Internal Medicine

## 2019-07-03 NOTE — Telephone Encounter (Signed)
Attempted phone call to pt to discuss up coming generator change out scheduled for 07/16/2019.  Left voicemail message for pt to call RN at [phone].

## 2019-07-03 NOTE — Telephone Encounter (Signed)
Patient's daughter, Chrys Racer states she is requesting to accompany the patient during her lab work scheduled for 07/12/19 due to the patient having issues with memory. She states she would also like to inquire about what time her procedure on 07/16/19 is scheduled for. Please advise

## 2019-07-03 NOTE — Telephone Encounter (Signed)
Pt wanted to know if her procedure scheduled for 03-22 could be done as an inpatient procedure. She lives with her daughter and will not have transportation to get to the hospital during the day. Please let her know what the office can do to help her

## 2019-07-05 NOTE — Telephone Encounter (Signed)
Spoke with pt.  Pt and pt's daughter, Beverely Low advised Dr Caryl Comes has arranged transportation through the hospital for her procedure appointment on 07/16/2019.  Pt and daughter verbalized understanding and agrees with current plan.

## 2019-07-05 NOTE — Telephone Encounter (Signed)
  Spoke with pt and received verbal permission to speak with her daughter Beverely Low and advised next time she is in the office to please add to her DPR.  Advised pt's daughter of lab appointment scheduled for 07/12/2019 at 130pm with Covid test to follow at 255pm at [facility] site.  Dr Caryl Comes has arranged transportation for pt the day procedure 07/16/2019.  Pt will arrive at hospital at 2pm with procedure scheduled for 4pm.  Pt's daughter verbalizes understanding and thanked Therapist, sports for the call.  Agrees with current plan.

## 2019-07-09 ENCOUNTER — Telehealth: Payer: Self-pay | Admitting: Internal Medicine

## 2019-07-09 NOTE — Telephone Encounter (Signed)
Patient calling in about upcoming procedure 1- Patient wondering if this could be an inpatient procedure do to no caretakers   Please advise when able

## 2019-07-10 NOTE — Telephone Encounter (Signed)
I have previously spoken with pt and her daughter and advised them Dr Caryl Comes has arranged for hospital transportation the day of her procedure.  Pt and daughter were both in agreement with this plan.

## 2019-07-12 ENCOUNTER — Other Ambulatory Visit: Payer: Self-pay

## 2019-07-12 ENCOUNTER — Other Ambulatory Visit: Payer: Medicare Other | Admitting: *Deleted

## 2019-07-12 ENCOUNTER — Other Ambulatory Visit (HOSPITAL_COMMUNITY)
Admission: RE | Admit: 2019-07-12 | Discharge: 2019-07-12 | Disposition: A | Discharge status: Home / Self Care | Payer: Medicare Other | Source: Ambulatory Visit | Attending: Internal Medicine | Admitting: Internal Medicine

## 2019-07-12 DIAGNOSIS — Z950 Presence of cardiac pacemaker: Secondary | ICD-10-CM | POA: Diagnosis not present

## 2019-07-12 DIAGNOSIS — Z01812 Encounter for preprocedural laboratory examination: Secondary | ICD-10-CM | POA: Insufficient documentation

## 2019-07-12 DIAGNOSIS — I4891 Unspecified atrial fibrillation: Secondary | ICD-10-CM | POA: Diagnosis not present

## 2019-07-12 DIAGNOSIS — Z20822 Contact with and (suspected) exposure to covid-19: Secondary | ICD-10-CM | POA: Diagnosis not present

## 2019-07-12 DIAGNOSIS — I495 Sick sinus syndrome: Secondary | ICD-10-CM

## 2019-07-12 LAB — CBC
Hematocrit: 42.1 % (ref 34.0–46.6)
Hemoglobin: 14 g/dL (ref 11.1–15.9)
MCH: 33.1 pg — ABNORMAL HIGH (ref 26.6–33.0)
MCHC: 33.3 g/dL (ref 31.5–35.7)
MCV: 100 fL — ABNORMAL HIGH (ref 79–97)
Platelets: 176 10*3/uL (ref 150–450)
RBC: 4.23 x10E6/uL (ref 3.77–5.28)
RDW: 13.6 % (ref 11.7–15.4)
WBC: 3.7 10*3/uL (ref 3.4–10.8)

## 2019-07-12 LAB — BASIC METABOLIC PANEL
BUN/Creatinine Ratio: 28 (ref 12–28)
BUN: 40 mg/dL — ABNORMAL HIGH (ref 8–27)
CO2: 26 mmol/L (ref 20–29)
Calcium: 8.8 mg/dL (ref 8.7–10.3)
Chloride: 107 mmol/L — ABNORMAL HIGH (ref 96–106)
Creatinine, Ser: 1.44 mg/dL — ABNORMAL HIGH (ref 0.57–1.00)
GFR calc Af Amer: 38 mL/min/{1.73_m2} — ABNORMAL LOW (ref >59)
GFR calc non Af Amer: 33 mL/min/{1.73_m2} — ABNORMAL LOW (ref >59)
Glucose: 84 mg/dL (ref 65–99)
Potassium: 4.5 mmol/L (ref 3.5–5.2)
Sodium: 140 mmol/L (ref 134–144)

## 2019-07-12 LAB — SARS CORONAVIRUS 2 (TAT 6-24 HRS): SARS Coronavirus 2: NEGATIVE

## 2019-07-12 NOTE — Telephone Encounter (Signed)
Attempted phone call to pt x 2 to advise pt Dr Caryl Comes is aware of previous diagnosis.  It is included in her last visit note with Dr Caryl Comes.  Someone answered phone but then disconnected twice.

## 2019-07-13 ENCOUNTER — Telehealth: Payer: Self-pay | Admitting: Internal Medicine

## 2019-07-13 NOTE — Telephone Encounter (Signed)
New message   Patient has questions about her procedure on 07/16/2019. Please call with instructions.

## 2019-07-13 NOTE — Telephone Encounter (Addendum)
Spoke with pt who states she has not heard from anyone regarding transportation to the hospital for her pacemaker generator change scheduled for 07/16/2019.  Confirmed with Dr Caryl Comes transportation has been arranged for pt.  Advised pt she will be picked up at 1pm.  Pt may have a light breakfast and then NPO until after procedure.  Pt verbalizes understanding and agrees with current plan.

## 2019-07-16 ENCOUNTER — Ambulatory Visit: Payer: Medicare Other | Admitting: Psychology

## 2019-07-16 ENCOUNTER — Other Ambulatory Visit: Payer: Self-pay

## 2019-07-16 ENCOUNTER — Encounter (HOSPITAL_COMMUNITY)
Admission: RE | Disposition: A | Discharge status: Home / Self Care | Payer: Self-pay | Source: Home / Self Care | Attending: Internal Medicine

## 2019-07-16 ENCOUNTER — Ambulatory Visit (HOSPITAL_COMMUNITY)
Admission: RE | Admit: 2019-07-16 | Discharge: 2019-07-16 | Disposition: A | Discharge status: Home / Self Care | Payer: Medicare Other | Attending: Internal Medicine | Admitting: Internal Medicine

## 2019-07-16 DIAGNOSIS — K589 Irritable bowel syndrome without diarrhea: Secondary | ICD-10-CM | POA: Insufficient documentation

## 2019-07-16 DIAGNOSIS — I509 Heart failure, unspecified: Secondary | ICD-10-CM | POA: Diagnosis not present

## 2019-07-16 DIAGNOSIS — E039 Hypothyroidism, unspecified: Secondary | ICD-10-CM | POA: Insufficient documentation

## 2019-07-16 DIAGNOSIS — G4733 Obstructive sleep apnea (adult) (pediatric): Secondary | ICD-10-CM | POA: Insufficient documentation

## 2019-07-16 DIAGNOSIS — Z8249 Family history of ischemic heart disease and other diseases of the circulatory system: Secondary | ICD-10-CM | POA: Diagnosis not present

## 2019-07-16 DIAGNOSIS — F329 Major depressive disorder, single episode, unspecified: Secondary | ICD-10-CM | POA: Diagnosis not present

## 2019-07-16 DIAGNOSIS — M1990 Unspecified osteoarthritis, unspecified site: Secondary | ICD-10-CM | POA: Diagnosis not present

## 2019-07-16 DIAGNOSIS — H409 Unspecified glaucoma: Secondary | ICD-10-CM | POA: Diagnosis not present

## 2019-07-16 DIAGNOSIS — Z888 Allergy status to other drugs, medicaments and biological substances status: Secondary | ICD-10-CM | POA: Insufficient documentation

## 2019-07-16 DIAGNOSIS — Z9013 Acquired absence of bilateral breasts and nipples: Secondary | ICD-10-CM | POA: Diagnosis not present

## 2019-07-16 DIAGNOSIS — I442 Atrioventricular block, complete: Secondary | ICD-10-CM | POA: Diagnosis not present

## 2019-07-16 DIAGNOSIS — I110 Hypertensive heart disease with heart failure: Secondary | ICD-10-CM | POA: Diagnosis not present

## 2019-07-16 DIAGNOSIS — M8580 Other specified disorders of bone density and structure, unspecified site: Secondary | ICD-10-CM | POA: Insufficient documentation

## 2019-07-16 DIAGNOSIS — F419 Anxiety disorder, unspecified: Secondary | ICD-10-CM | POA: Insufficient documentation

## 2019-07-16 DIAGNOSIS — K219 Gastro-esophageal reflux disease without esophagitis: Secondary | ICD-10-CM | POA: Insufficient documentation

## 2019-07-16 DIAGNOSIS — I4891 Unspecified atrial fibrillation: Secondary | ICD-10-CM

## 2019-07-16 DIAGNOSIS — Z7901 Long term (current) use of anticoagulants: Secondary | ICD-10-CM | POA: Insufficient documentation

## 2019-07-16 DIAGNOSIS — Z79899 Other long term (current) drug therapy: Secondary | ICD-10-CM | POA: Insufficient documentation

## 2019-07-16 DIAGNOSIS — Z45010 Encounter for checking and testing of cardiac pacemaker pulse generator [battery]: Secondary | ICD-10-CM | POA: Insufficient documentation

## 2019-07-16 DIAGNOSIS — Z79890 Hormone replacement therapy (postmenopausal): Secondary | ICD-10-CM | POA: Diagnosis not present

## 2019-07-16 DIAGNOSIS — I495 Sick sinus syndrome: Secondary | ICD-10-CM | POA: Insufficient documentation

## 2019-07-16 HISTORY — PX: PPM GENERATOR CHANGEOUT: EP1233

## 2019-07-16 SURGERY — PPM GENERATOR CHANGEOUT

## 2019-07-16 MED ORDER — LIDOCAINE HCL 1 % IJ SOLN
INTRAMUSCULAR | Status: AC
  Filled 2019-07-16: qty 20

## 2019-07-16 MED ORDER — CEFAZOLIN SODIUM-DEXTROSE 2-4 GM/100ML-% IV SOLN
INTRAVENOUS | Status: AC
  Filled 2019-07-16: qty 100

## 2019-07-16 MED ORDER — SODIUM CHLORIDE 0.9 % IV SOLN
80.0000 mg | INTRAVENOUS | Status: AC
  Administered 2019-07-16: 80 mg
  Filled 2019-07-16: qty 2

## 2019-07-16 MED ORDER — SODIUM CHLORIDE 0.9 % IV SOLN
INTRAVENOUS | Status: AC
  Filled 2019-07-16: qty 2

## 2019-07-16 MED ORDER — SODIUM CHLORIDE 0.9 % IV SOLN: INTRAVENOUS | Status: DC

## 2019-07-16 MED ORDER — CEFAZOLIN SODIUM-DEXTROSE 2-4 GM/100ML-% IV SOLN
2.0000 g | INTRAVENOUS | Status: AC
  Administered 2019-07-16: 2 g via INTRAVENOUS
  Filled 2019-07-16: qty 100

## 2019-07-16 MED ORDER — ACETAMINOPHEN 325 MG PO TABS
325.0000 mg | ORAL_TABLET | ORAL | Status: DC | PRN
  Filled 2019-07-16: qty 2

## 2019-07-16 MED ORDER — LIDOCAINE HCL (PF) 1 % IJ SOLN
INTRAMUSCULAR | Status: DC | PRN
  Administered 2019-07-16: 60 mL

## 2019-07-16 MED ORDER — ONDANSETRON HCL 4 MG/2ML IJ SOLN: 4.0000 mg | Freq: Four times a day (QID) | INTRAMUSCULAR | Status: DC | PRN

## 2019-07-16 SURGICAL SUPPLY — 6 items
CABLE SURGICAL S-101-97-12 (CABLE) ×2 IMPLANT
HEMOSTAT SURGICEL 2X4 FIBR (HEMOSTASIS) ×1 IMPLANT
IPG PACE AZUR XT DR MRI W1DR01 (Pacemaker) IMPLANT
PACE AZURE XT DR MRI W1DR01 (Pacemaker) ×2 IMPLANT
PAD PRO RADIOLUCENT 2001M-C (PAD) ×2 IMPLANT
TRAY PACEMAKER INSERTION (PACKS) ×2 IMPLANT

## 2019-07-16 NOTE — Interval H&P Note (Signed)
History and Physical Interval Note:  07/16/2019 3:40 PM  [name]  has presented today for surgery, with the diagnosis of ERI.  The various methods of treatment have been discussed with the patient and family. After consideration of risks, benefits and other options for treatment, the patient has consented to  Procedure(s): PPM GENERATOR CHANGEOUT (N/A) as a surgical intervention.  The patient's history has been reviewed, patient examined, no change in status, stable for surgery.  I have reviewed the patient's chart and labs.  Questions were answered to the patient's satisfaction.     Virl Axe  BP 119/76   Pulse 65   Temp (!) 97.4 F (36.3 C) (Tympanic)   Ht 5\' 4"  (1.626 m)   Wt 69.9 kg   SpO2 100%   BMI 26.43 kg/m  Well developed and nourished in no acute distress HENT normal Neck supple with JVP  Clear Regular rate and rhythm, no murmurs or gallops Abd-soft with active BS No Clubbing cyanosis edema Skin-warm and dry A & Oriented  Grossly normal sensory and motor function   Device at [facility]  We have reviewed the benefits and risks of generator replacement.  These include but are not limited to lead fracture and infection.  The patient understands, agrees and is willing to proceed.

## 2019-07-16 NOTE — Telephone Encounter (Signed)
Spoke with pt Friday 07/13/2019.  Advised pt Dr Caryl Comes is aware of previous PE.  Pt verbalizes understanding and agrees with plan to proceed with generator change on 07/16/2019.

## 2019-07-16 NOTE — Discharge Instructions (Signed)
Pacemaker Battery Change, Care After This sheet gives you information about how to care for yourself after your procedure. Your health care provider may also give you more specific instructions. If you have problems or questions, contact your health care provider. What can I expect after the procedure? After your procedure, it is common to have:  Pain or soreness at the site where the pacemaker was inserted.  Swelling at the site where the pacemaker was inserted. Follow these instructions at home: Incision care   Keep the incision clean and dry. ? Do not take baths, swim, or use a hot tub until your health care provider approves. ? You may shower the day after your procedure, or as directed by your health care provider. ? Pat the area dry with a clean towel. Do not rub the area. This may cause bleeding.  Follow instructions from your health care provider about how to take care of your incision. Make sure you: ? Wash your hands with soap and water before you change your bandage (dressing). If soap and water are not available, use hand sanitizer. ? Change your dressing as told by your health care provider. ? Leave stitches (sutures), skin glue, or adhesive strips in place. These skin closures may need to stay in place for 2 weeks or longer. If adhesive strip edges start to loosen and curl up, you may trim the loose edges. Do not remove adhesive strips completely unless your health care provider tells you to do that.  Check your incision area every day for signs of infection. Check for: ? More redness, swelling, or pain. ? More fluid or blood. ? Warmth. ? Pus or a bad smell. Activity  Do not lift anything that is heavier than 10 lb (4.5 kg) until your health care provider says it is okay to do so.  For the first 2 weeks, or as long as told by your health care provider: ? Avoid lifting your left arm higher than your shoulder. ? Be gentle when you move your arms over your head. It is okay  to raise your arm to comb your hair. ? Avoid strenuous exercise.  Ask your health care provider when it is okay to: ? Resume your normal activities. ? Return to work or school. ? Resume sexual activity. Eating and drinking  Eat a heart-healthy diet. This should include plenty of fresh fruits and vegetables, whole grains, low-fat dairy products, and lean protein like chicken and fish.  Limit alcohol intake to no more than 1 drink a day for non-pregnant women and 2 drinks a day for men. One drink equals 12 oz of beer, 5 oz of wine, or 1 oz of hard liquor.  Check ingredients and nutrition facts on packaged foods and beverages. Avoid the following types of food: ? Food that is high in salt (sodium). ? Food that is high in saturated fat, like full-fat dairy or red meat. ? Food that is high in trans fat, like fried food. ? Food and drinks that are high in sugar. Lifestyle  Do not use any products that contain nicotine or tobacco, such as cigarettes and e-cigarettes. If you need help quitting, ask your health care provider.  Take steps to manage and control your weight.  Get regular exercise. Aim for 150 minutes of moderate-intensity exercise (such as walking or yoga) or 75 minutes of vigorous exercise (such as running or swimming) each week.  Manage other health problems, such as diabetes or high blood pressure. Ask your health  care provider how you can manage these conditions. General instructions  Do not drive for 24 hours after your procedure if you were given a medicine to help you relax (sedative).  Take over-the-counter and prescription medicines only as told by your health care provider.  Avoid putting pressure on the area where the pacemaker was placed.  If you need an MRI after your pacemaker has been placed, be sure to tell the health care provider who orders the MRI that you have a pacemaker.  Avoid close and prolonged exposure to electrical devices that have strong  magnetic fields. These include: ? Cell phones. Avoid keeping them in a pocket near the pacemaker, and try using the ear opposite the pacemaker. ? MP3 players. ? Household appliances, like microwaves. ? Metal detectors. ? Electric generators. ? High-tension wires.  Keep all follow-up visits as directed by your health care provider. This is important. Contact a health care provider if:  You have pain at the incision site that is not relieved by over-the-counter or prescription medicines.  You have any of these around your incision site or coming from it: ? More redness, swelling, or pain. ? Fluid or blood. ? Warmth to the touch. ? Pus or a bad smell.  You have a fever.  You feel brief, occasional palpitations, light-headedness, or any symptoms that you think might be related to your heart. Get help right away if:  You experience chest pain that is different from the pain at the pacemaker site.  You develop a red streak that extends above or below the incision site.  You experience shortness of breath.  You have palpitations or an irregular heartbeat.  You have light-headedness that does not go away quickly.  You faint or have dizzy spells.  Your pulse suddenly drops or increases rapidly and does not return to normal.  You begin to gain weight and your legs and ankles swell. Summary  After your procedure, it is common to have pain, soreness, and some swelling where the pacemaker was inserted.  Make sure to keep your incision clean and dry. Follow instructions from your health care provider about how to take care of your incision.  Check your incision every day for signs of infection, such as more pain or swelling, pus or a bad smell, warmth, or leaking fluid and blood.  Avoid strenuous exercise and lifting your left arm higher than your shoulder for 2 weeks, or as long as told by your health care provider. This information is not intended to replace advice given to you by  your health care provider. Make sure you discuss any questions you have with your health care provider. Document Revised: 03/25/2017 Document Reviewed: 03/04/2016 Elsevier Patient Education  2020 [employer].

## 2019-07-17 ENCOUNTER — Telehealth: Payer: Self-pay | Admitting: Internal Medicine

## 2019-07-17 NOTE — Telephone Encounter (Signed)
Spoke with pt, she was questioning what it means to send a manual transmission, instructions in her booklet indicate do not send manual transmissions.  Assisted pt with sending a manual transmission so she understand what it referred to.  Pt instructed to only send manual transmissions going forward when instructed.   Reaffirmed instructions of using ear opposite device for cell phone.  It is ok to use a microwave

## 2019-07-17 NOTE — Telephone Encounter (Signed)
Patient had a pacemaker placed yesterday and has some questions. Please call to discuss. States she also has some questions regarding her incision.

## 2019-07-25 ENCOUNTER — Emergency Department (HOSPITAL_COMMUNITY): Payer: Medicare Other

## 2019-07-25 ENCOUNTER — Emergency Department (HOSPITAL_COMMUNITY)
Admission: EM | Admit: 2019-07-25 | Discharge: 2019-07-25 | Disposition: A | Discharge status: Home / Self Care | Payer: Medicare Other | Attending: Emergency Medicine | Admitting: Emergency Medicine

## 2019-07-25 DIAGNOSIS — Z86711 Personal history of pulmonary embolism: Secondary | ICD-10-CM | POA: Diagnosis not present

## 2019-07-25 DIAGNOSIS — R404 Transient alteration of awareness: Secondary | ICD-10-CM | POA: Diagnosis not present

## 2019-07-25 DIAGNOSIS — R410 Disorientation, unspecified: Secondary | ICD-10-CM | POA: Insufficient documentation

## 2019-07-25 DIAGNOSIS — Z951 Presence of aortocoronary bypass graft: Secondary | ICD-10-CM | POA: Diagnosis not present

## 2019-07-25 DIAGNOSIS — N183 Chronic kidney disease, stage 3 unspecified: Secondary | ICD-10-CM | POA: Insufficient documentation

## 2019-07-25 DIAGNOSIS — Z9889 Other specified postprocedural states: Secondary | ICD-10-CM | POA: Insufficient documentation

## 2019-07-25 DIAGNOSIS — E039 Hypothyroidism, unspecified: Secondary | ICD-10-CM | POA: Insufficient documentation

## 2019-07-25 DIAGNOSIS — L930 Discoid lupus erythematosus: Secondary | ICD-10-CM | POA: Diagnosis not present

## 2019-07-25 DIAGNOSIS — I2510 Atherosclerotic heart disease of native coronary artery without angina pectoris: Secondary | ICD-10-CM | POA: Diagnosis not present

## 2019-07-25 DIAGNOSIS — S0990XA Unspecified injury of head, initial encounter: Secondary | ICD-10-CM | POA: Diagnosis not present

## 2019-07-25 DIAGNOSIS — E853 Secondary systemic amyloidosis: Secondary | ICD-10-CM | POA: Diagnosis not present

## 2019-07-25 DIAGNOSIS — R609 Edema, unspecified: Secondary | ICD-10-CM | POA: Diagnosis not present

## 2019-07-25 DIAGNOSIS — M5489 Other dorsalgia: Secondary | ICD-10-CM | POA: Diagnosis not present

## 2019-07-25 DIAGNOSIS — I10 Essential (primary) hypertension: Secondary | ICD-10-CM | POA: Diagnosis not present

## 2019-07-25 DIAGNOSIS — Z7901 Long term (current) use of anticoagulants: Secondary | ICD-10-CM | POA: Diagnosis not present

## 2019-07-25 DIAGNOSIS — I129 Hypertensive chronic kidney disease with stage 1 through stage 4 chronic kidney disease, or unspecified chronic kidney disease: Secondary | ICD-10-CM | POA: Diagnosis not present

## 2019-07-25 DIAGNOSIS — Z79899 Other long term (current) drug therapy: Secondary | ICD-10-CM | POA: Diagnosis not present

## 2019-07-25 DIAGNOSIS — R296 Repeated falls: Secondary | ICD-10-CM | POA: Diagnosis not present

## 2019-07-25 DIAGNOSIS — S199XXA Unspecified injury of neck, initial encounter: Secondary | ICD-10-CM | POA: Diagnosis not present

## 2019-07-25 DIAGNOSIS — R531 Weakness: Secondary | ICD-10-CM | POA: Diagnosis not present

## 2019-07-25 LAB — TROPONIN I (HIGH SENSITIVITY)
Troponin I (High Sensitivity): 17 ng/L (ref <18)
Troponin I (High Sensitivity): 15 ng/L (ref <18)

## 2019-07-25 LAB — URINALYSIS, ROUTINE W REFLEX MICROSCOPIC
Bacteria, UA: NONE SEEN
Bilirubin Urine: NEGATIVE
Glucose, UA: NEGATIVE mg/dL
Ketones, ur: NEGATIVE mg/dL
Leukocytes,Ua: NEGATIVE
Nitrite: NEGATIVE
Protein, ur: 30 mg/dL — AB
Specific Gravity, Urine: 1.012 (ref 1.005–1.030)
pH: 8 (ref 5.0–8.0)

## 2019-07-25 LAB — COMPREHENSIVE METABOLIC PANEL
ALT: 26 U/L (ref 0–44)
AST: 35 U/L (ref 15–41)
Albumin: 3.6 g/dL (ref 3.5–5.0)
Alkaline Phosphatase: 60 U/L (ref 38–126)
Anion gap: 8 (ref 5–15)
BUN: 36 mg/dL — ABNORMAL HIGH (ref 8–23)
CO2: 26 mmol/L (ref 22–32)
Calcium: 9.4 mg/dL (ref 8.9–10.3)
Chloride: 102 mmol/L (ref 98–111)
Creatinine, Ser: 1.23 mg/dL — ABNORMAL HIGH (ref 0.44–1.00)
GFR calc Af Amer: 46 mL/min — ABNORMAL LOW (ref >60)
GFR calc non Af Amer: 39 mL/min — ABNORMAL LOW (ref >60)
Glucose, Bld: 107 mg/dL — ABNORMAL HIGH (ref 70–99)
Potassium: 5.5 mmol/L — ABNORMAL HIGH (ref 3.5–5.1)
Sodium: 136 mmol/L (ref 135–145)
Total Bilirubin: 2 mg/dL — ABNORMAL HIGH (ref 0.3–1.2)
Total Protein: 7.1 g/dL (ref 6.5–8.1)

## 2019-07-25 LAB — CBC WITH DIFFERENTIAL/PLATELET
Abs Immature Granulocytes: 0.09 10*3/uL — ABNORMAL HIGH (ref 0.00–0.07)
Basophils Absolute: 0 10*3/uL (ref 0.0–0.1)
Basophils Relative: 0 %
Eosinophils Absolute: 0 10*3/uL (ref 0.0–0.5)
Eosinophils Relative: 0 %
HCT: 42.7 % (ref 36.0–46.0)
Hemoglobin: 14 g/dL (ref 12.0–15.0)
Immature Granulocytes: 1 %
Lymphocytes Relative: 9 %
Lymphs Abs: 0.6 10*3/uL — ABNORMAL LOW (ref 0.7–4.0)
MCH: 33.7 pg (ref 26.0–34.0)
MCHC: 32.8 g/dL (ref 30.0–36.0)
MCV: 102.6 fL — ABNORMAL HIGH (ref 80.0–100.0)
Monocytes Absolute: 0.4 10*3/uL (ref 0.1–1.0)
Monocytes Relative: 6 %
Neutro Abs: 5.7 10*3/uL (ref 1.7–7.7)
Neutrophils Relative %: 84 %
Platelets: 165 10*3/uL (ref 150–400)
RBC: 4.16 MIL/uL (ref 3.87–5.11)
RDW: 13.4 % (ref 11.5–15.5)
WBC: 6.9 10*3/uL (ref 4.0–10.5)
nRBC: 0 % (ref 0.0–0.2)

## 2019-07-25 NOTE — ED Notes (Signed)
pt bears weight but is unsteady.  Should use walker or have someone beside her.

## 2019-07-25 NOTE — Discharge Instructions (Addendum)
[name] should stop taking her lorazepam (ativan).  It may contribute to falls and confusion.  Have her rechecked immediately if she has any new or concerning symptoms.

## 2019-07-25 NOTE — ED Triage Notes (Signed)
Pt here from home via EMS, family reports 2 unwitnessed falls today-both time patient found sitting on the floor by her bed. Pt more confused than her normal. Family sts pt should be able to state year and location, some confusion at baseline. LSW 1930 last night. Family reports more lethargy, but patient alert to voice and can converse well. Pt c/o feeling tired. C collar in place. Only obvious injury is abrasion on L arm, no wounds to head. Pacemaker placed last week. Pt sts she is currently taking eliquis for a PE.

## 2019-07-25 NOTE — ED Provider Notes (Signed)
[facility] EMERGENCY DEPARTMENT Provider Note   CSN: [id number] Arrival date & time: 07/25/19  1442     History Chief Complaint  Patient presents with  . Fall    [name] is a 86 y.o. female.  The history is provided by the patient, the EMS personnel and medical records. No language interpreter was used.  Fall   [name] is a 86 y.o. female who presents to the Emergency Department complaining of fall. Level V caveat due to confusion. She presents the emergency department by EMS for evaluation following two unwitnessed falls. She was found on the floor by her bed both times. Patient is unsure why she is in the emergency department. She states she woke up on the floor but does not recall falling. She denies any pain or injuries. She denies any recent illnesses. Per EMS report family states she is more lethargic. She did have a pacemaker placed last week and takes eliquis for history of PE.    Past Medical History:  Diagnosis Date  . Anxiety   . Bradycardia   . Breast cancer, left breast ([place]) 1995  . Breast cancer, right breast ([place]) 2002  . CHF (congestive heart failure) ([place])   . Depression    Dr Cheryln Manly  . Discoid lupus    skin  . Full dentures   . GERD (gastroesophageal reflux disease)   . Glaucoma   . Gout   . Hx of echocardiogram    a.  Echocardiogram (03/2011): Mild LVH, EF 0000000, grade 1 diastolic dysfunction, mild MR, mild to moderate TR, PASP 42;  b. Echo (05/2013):  Mod LVH, EF 65-70%, no WMA, Gr 1 DD, mild MR, mod TR, PASP 39  . Hx of exercise stress test    a. ETT-echocardiogram (06/08/11): Sub-optimal exercise. Test stopped early due to dizziness and hypotension. EF 60%.  Non-diagnostic.;   b. Lexiscan Myoview (06/2013):  Diaphragmatic attenuation, no ischemia, EF 61%.  Low Risk  . Hypertension   . Hypothyroidism   . IBS (irritable bowel syndrome)   . Incontinence   . OSA (obstructive sleep apnea) 07/20/2016   Mild with AHI 12/hr now  on CPAP  . Osteoarthritis   . Osteopenia   . Pacemaker 2009   Brady//Chronotropic incompetence with normal pacemaker function  . Thyroid disease   . Wears hearing aid    both ears    Patient Active Problem List   Diagnosis Date Noted  . DVT (deep venous thrombosis) ([place]) 03/29/2018  . PE (pulmonary thromboembolism) ([place]) 02/03/2018  . Macrocytosis 12/21/2017  . Wart 12/21/2017  . OSA (obstructive sleep apnea) 07/20/2016  . Sigmoid diverticulitis 07/01/2016  . Acute diverticulitis   . Rectal bleeding 06/30/2016  . Impacted cerumen of right ear 03/26/2016  . Syncope 03/22/2016  . Benign essential HTN 03/22/2016  . CAD (coronary artery disease) 03/22/2016  . CKD (chronic kidney disease) stage 3, GFR 30-59 ml/min (HCC) 03/22/2016  . Depression 03/22/2016  . Exertional dyspnea 11/04/2015  . Ascending aortic aneurysm ([place]) 11/04/2015  . 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 Arthralgia 10/10/2015  . Pulmonary hypertension ([place]) 09/30/2015  . Mild persistent asthma 09/30/2015  . Irritable larynx 09/04/2015  . Rotator cuff disorder 03/06/2015  . OCD (obsessive compulsive disorder) 04/03/2013  . IBS (irritable bowel syndrome) 07/03/2012  . Dysphagia 04/13/2012  . Denture irritation 12/20/2011  . Irritable bladder 12/20/2011  . Knee pain, bilateral 12/20/2011  . Hearing loss 10/11/2011  . Pacemaker-Medtronic 05/18/2011  . DOE (dyspnea on  exertion) 04/13/2011  . Gout, unspecified 04/30/2010  . Anal fissure 02/03/2010  . Atrial fibrillation ([place]) 08/26/2009  . Sinoatrial node dysfunction ([place]) 09/17/2008  . HYPOTENSION, ORTHOSTATIC 01/29/2008  . Microscopic hematuria 08/21/2007  . Pain in joint, pelvic region and thigh 08/01/2007  . Vitamin D deficiency 05/23/2007  . Anxiety state 05/23/2007  . Hypothyroidism 03/22/2007  . GERD 03/22/2007  . Osteoarthritis 03/22/2007  . CFS (chronic fatigue syndrome) 11/17/2006  . Hypertensive heart disease 11/17/2006  . ALLERGIC RHINITIS 11/17/2006   . OSTEOPOROSIS 11/17/2006  . BREAST CANCER, HX OF 11/17/2006    Past Surgical History:  Procedure Laterality Date  . BREAST BIOPSY Left 1995  . BREAST LUMPECTOMY Left 1995   axillary node dissectoin  . CARDIAC CATHETERIZATION N/A 02/13/2016   Procedure: Right Heart Cath;  Surgeon: Larey Dresser, MD;  Location: [facility];  Service: Cardiovascular;  Laterality: N/A;  . CATARACT EXTRACTION W/ INTRAOCULAR LENS  IMPLANT, BILATERAL Bilateral 06/2009 - 12.2915   left - right  . DORSAL COMPARTMENT RELEASE  2000   left  . HAMMER TOE SURGERY Left 2004  . HEMORRHOID BANDING    . INSERT / REPLACE / REMOVE PACEMAKER  2009  . KNEE ARTHROSCOPY Left 08/29/2012   Procedure: LEFT KNEE ARTHROSCOPY ;  Surgeon: Hessie Dibble, MD;  Location: [facility];  Service: Orthopedics;  Laterality: Left;  [name] MASTECTOMY Bilateral 09/2000   nbx  . PPM GENERATOR CHANGEOUT N/A 07/16/2019   Procedure: PPM GENERATOR CHANGEOUT;  Surgeon: Deboraha Sprang, MD;  Location: [facility];  Service: Cardiovascular;  Laterality: N/A;  . TRIGGER FINGER RELEASE Right 07/31/2013   Procedure: EXCISE MASS RIGHT INDEX A-1 PLLLEY RELEASE A-1 RIGHT INDEX;  Surgeon: Cammie Sickle., MD;  Location: [facility];  Service: Orthopedics;  Laterality: Right;  [name] VAGINAL HYSTERECTOMY       OB History   No obstetric history on file.     Family History  Problem Relation Age of Onset  . Ovarian cancer Mother   . Heart disease Father   . Stroke Sister   . Stroke Brother   . Cancer Brother   . Heart disease Brother     Social History   Tobacco Use  . Smoking status: Never Smoker  . Smokeless tobacco: Never Used  Substance Use Topics  . Alcohol use: No    Alcohol/week: 0.0 standard drinks  . Drug use: No    Home Medications Prior to Admission medications   Medication Sig Start Date End Date Taking? Authorizing Provider  apixaban (ELIQUIS) 5 MG TABS tablet Take 1 tablet (5 mg  total) by mouth 2 (two) times daily. 06/06/19  Yes Plotnikov, Evie Lacks, MD  cetirizine (ZYRTEC) 10 MG tablet Take 10 mg by mouth daily.    Yes [provider]  cholecalciferol (VITAMIN D3) 25 MCG (1000 UNIT) tablet Take 1,000 Units by mouth daily.   Yes [provider]  clotrimazole-betamethasone (LOTRISONE) cream Apply 1 application topically in the morning and at bedtime. 06/27/19  Yes [provider]  escitalopram (LEXAPRO) 10 MG tablet TAKE 1 TABLET (10 MG) BY MOUTH DAILY AT BEDTIME. Patient taking differently: 10 mg at bedtime.  06/18/19  Yes Arfeen, Arlyce Harman, MD  famotidine (PEPCID) 10 MG tablet Take 10 mg by mouth 2 (two) times daily.   Yes [provider]  LORazepam (ATIVAN) 0.5 MG tablet Take 1 tab daily as needed for anxiety Patient taking differently: Take 0.5  mg by mouth daily as needed for anxiety.  06/18/19  Yes Arfeen, Arlyce Harman, MD  magnesium oxide (MAG-OX) 400 MG tablet TAKE 1/2 TABLET BY MOUTH EVERY DAY Patient taking differently: Take 200 mg by mouth daily.  05/17/19  Yes Larey Dresser, MD  REFRESH TEARS 0.5 % SOLN Place 1 drop into both eyes daily as needed (dry eyes).  02/07/17  Yes [provider]  spironolactone (ALDACTONE) 25 MG tablet Take 1 tablet (25 mg total) by mouth daily. Patient taking differently: Take 25 mg by mouth at bedtime.  06/06/19  Yes Plotnikov, Evie Lacks, MD  SYNTHROID 100 MCG tablet Take 1 tablet (100 mcg total) by mouth daily. 06/12/19  Yes Plotnikov, Evie Lacks, MD  tolterodine (DETROL LA) 2 MG 24 hr capsule Take 1 capsule (2 mg total) by mouth daily. 06/06/19  Yes Plotnikov, Evie Lacks, MD    Allergies    Lactase, Amlodipine besy-benazepril hcl, Amlodipine besylate, Clonidine hydrochloride, Lactose intolerance (gi), Valsartan, and Verapamil  Review of Systems   Review of Systems  All other systems reviewed and are negative.   Physical Exam Updated Vital Signs BP 110/69 (BP Location: Right Arm)   Pulse 68    Temp (!) 97.5 F (36.4 C) (Oral)   Resp 16   Ht 5\' 4"  (1.626 m)   Wt 69.9 kg   SpO2 99%   BMI 26.43 kg/m   Physical Exam Vitals and nursing note reviewed.  Constitutional:      General: She is not in acute distress.    Appearance: Normal appearance. She is well-developed.  HENT:     Head: Normocephalic and atraumatic.  Cardiovascular:     Rate and Rhythm: Normal rate and regular rhythm.     Heart sounds: No murmur.  Pulmonary:     Effort: Pulmonary effort is normal. No respiratory distress.     Breath sounds: Normal breath sounds.     Comments: There is a surgical site in the right upper chest wall with local edema. No local tenderness or erythema. Abdominal:     Palpations: Abdomen is soft.     Tenderness: There is no abdominal tenderness. There is no guarding or rebound.  Musculoskeletal:        General: No swelling or tenderness.  Skin:    General: Skin is warm and dry.  Neurological:     Mental Status: She is oriented to person, place, and time.     Comments: No asymmetry of facial movements. Five out of five strength in all four extremities with sensation light touch intact in all four extremities. Mildly confused. Slow to answer questions. Awakens easily to voice but she does fall asleep quickly and is drowsy.  Psychiatric:        Behavior: Behavior normal.     ED Results / Procedures / Treatments   Labs (all labs ordered are listed, but only abnormal results are displayed) Labs Reviewed  COMPREHENSIVE METABOLIC PANEL - Abnormal; Notable for the following components:      Result Value   Potassium 5.5 (*)    Glucose, Bld 107 (*)    BUN 36 (*)    Creatinine, Ser 1.23 (*)    Total Bilirubin 2.0 (*)    GFR calc non Af Amer 39 (*)    GFR calc Af Amer 46 (*)    All other components within normal limits  CBC WITH DIFFERENTIAL/PLATELET - Abnormal; Notable for the following components:   MCV 102.6 (*)    Lymphs  Abs 0.6 (*)    Abs Immature Granulocytes 0.09 (*)     All other components within normal limits  URINALYSIS, ROUTINE W REFLEX MICROSCOPIC - Abnormal; Notable for the following components:   Hgb urine dipstick MODERATE (*)    Protein, ur 30 (*)    All other components within normal limits  URINE CULTURE  TROPONIN I (HIGH SENSITIVITY)  TROPONIN I (HIGH SENSITIVITY)    EKG EKG Interpretation  Date/Time:  Wednesday July 25 2019 15:42:19 EDT Ventricular Rate:  60 PR Interval:    QRS Duration: 89 QT Interval:  443 QTC Calculation: 443 R Axis:   -47 Text Interpretation: Sinus rhythm Prolonged PR interval Left anterior fascicular block Probable anterior infarct, age indeterminate Abnormal T, consider ischemia, diffuse leads Lateral leads are also involved Confirmed by Quintella Reichert [id number]) on 07/25/2019 4:35:20 PM   Radiology CT Head Wo Contrast  Result Date: 07/25/2019 CLINICAL DATA:  Un witnessed fall x2 EXAM: CT HEAD WITHOUT CONTRAST CT CERVICAL SPINE WITHOUT CONTRAST TECHNIQUE: Multidetector CT imaging of the head and cervical spine was performed following the standard protocol without intravenous contrast. Multiplanar CT image reconstructions of the cervical spine were also generated. COMPARISON:  02/26/2018 FINDINGS: CT HEAD FINDINGS Brain: No acute infarct or hemorrhage. Lateral ventricles and midline structures are unremarkable. No acute extra-axial fluid collections. No mass effect. Vascular: No hyperdense vessel or unexpected calcification. Skull: Normal. Negative for fracture or focal lesion. Sinuses/Orbits: No acute finding. Other: None CT CERVICAL SPINE FINDINGS Alignment: Alignment is anatomic. Skull base and vertebrae: No acute displaced fractures. Soft tissues and spinal canal: No prevertebral fluid or swelling. No visible canal hematoma. Disc levels: There is multilevel disc space narrowing and osteophyte formation, most pronounced at the C5-6 and C6-7 levels. Right predominant uncovertebral hypertrophy at C3/C4 results in  significant right-sided neural foraminal encroachment. Upper chest: Airway is patent.  Lung apices are clear. Other: Reconstructed images demonstrate no additional findings. IMPRESSION: 1. No acute intracranial process. 2. Cervical spondylosis, no acute fracture. Electronically Signed   By: Randa Ngo M.D.   On: 07/25/2019 16:18   CT Cervical Spine Wo Contrast  Result Date: 07/25/2019 CLINICAL DATA:  Un witnessed fall x2 EXAM: CT HEAD WITHOUT CONTRAST CT CERVICAL SPINE WITHOUT CONTRAST TECHNIQUE: Multidetector CT imaging of the head and cervical spine was performed following the standard protocol without intravenous contrast. Multiplanar CT image reconstructions of the cervical spine were also generated. COMPARISON:  02/26/2018 FINDINGS: CT HEAD FINDINGS Brain: No acute infarct or hemorrhage. Lateral ventricles and midline structures are unremarkable. No acute extra-axial fluid collections. No mass effect. Vascular: No hyperdense vessel or unexpected calcification. Skull: Normal. Negative for fracture or focal lesion. Sinuses/Orbits: No acute finding. Other: None CT CERVICAL SPINE FINDINGS Alignment: Alignment is anatomic. Skull base and vertebrae: No acute displaced fractures. Soft tissues and spinal canal: No prevertebral fluid or swelling. No visible canal hematoma. Disc levels: There is multilevel disc space narrowing and osteophyte formation, most pronounced at the C5-6 and C6-7 levels. Right predominant uncovertebral hypertrophy at C3/C4 results in significant right-sided neural foraminal encroachment. Upper chest: Airway is patent.  Lung apices are clear. Other: Reconstructed images demonstrate no additional findings. IMPRESSION: 1. No acute intracranial process. 2. Cervical spondylosis, no acute fracture. Electronically Signed   By: Randa Ngo M.D.   On: 07/25/2019 16:18   DG Chest Port 1 View  Result Date: 07/25/2019 CLINICAL DATA:  Two unwitnessed falls today. EXAM: PORTABLE CHEST 1 VIEW  COMPARISON:  Radiographs 02/16/2017 and 06/22/2018.  CT 02/03/2018. FINDINGS: 1524 hours. Right subclavian pacemaker leads appear unchanged. The heart size is stable at the upper limits of normal. There is aortic atherosclerosis. The lungs are clear. There is no pleural effusion or pneumothorax. No acute fractures are seen. Patient appears to be status post bilateral mastectomy and left axillary node dissection. The subacromial space of the right shoulder is narrowed, consistent with a chronic rotator cuff tear. IMPRESSION: No evidence of acute chest injury or active cardiopulmonary process. Electronically Signed   By: Richardean Sale M.D.   On: 07/25/2019 15:37    Procedures Procedures (including critical care time)  Medications Ordered in ED Medications - No data to display  ED Course  I have reviewed the triage vital signs and the nursing notes.  Pertinent labs & imaging results that were available during my care of the patient were reviewed by me and considered in my medical decision making (see chart for details).    MDM Rules/Calculators/A&P                     Patient here for evaluation following two unwitnessed falls at home. She is confused but nonfocal on evaluation. Labs are near her baseline. No evidence of acute infectious process. No evidence of intracranial injury secondary to fall. Discussed with patient son option for observation in the hospital for further evaluation due to her frequent falls and slight increase in her confusion and he prefers going home at this time. Discussed importance of outpatient follow-up and return precautions.  Final Clinical Impression(s) / ED Diagnoses Final diagnoses:  Frequent falls  Confusion    Rx / DC Orders ED Discharge Orders    None       Quintella Reichert, MD 07/25/19 2329

## 2019-07-25 NOTE — ED Notes (Signed)
Patient unable to tolerate orthostatic vital signs, stating she was uncomfortable and scared to fall. Patient would not remain still for the blood pressure to take.

## 2019-07-25 NOTE — ED Notes (Signed)
This RN entered room and found patient lying on the floor, alert, at the end of the bed, both side rails up and still attached to monitoring equipment. No change in mentation from initial exam. Pt assisted back to bed. MD notified and at bedside for post-fall exam.

## 2019-07-26 ENCOUNTER — Ambulatory Visit: Payer: Medicare Other

## 2019-07-26 LAB — URINE CULTURE: Culture: NO GROWTH

## 2019-07-30 ENCOUNTER — Ambulatory Visit (INDEPENDENT_AMBULATORY_CARE_PROVIDER_SITE_OTHER): Payer: Medicare Other | Admitting: Psychology

## 2019-07-30 DIAGNOSIS — F331 Major depressive disorder, recurrent, moderate: Secondary | ICD-10-CM

## 2019-08-02 ENCOUNTER — Other Ambulatory Visit: Payer: Self-pay

## 2019-08-02 ENCOUNTER — Telehealth (INDEPENDENT_AMBULATORY_CARE_PROVIDER_SITE_OTHER): Payer: Medicare Other | Admitting: *Deleted

## 2019-08-02 DIAGNOSIS — I495 Sick sinus syndrome: Secondary | ICD-10-CM

## 2019-08-02 DIAGNOSIS — Z950 Presence of cardiac pacemaker: Secondary | ICD-10-CM

## 2019-08-02 LAB — CUP PACEART REMOTE DEVICE CHECK
Battery Remaining Longevity: 147 mo
Battery Voltage: 3.22 V
Brady Statistic AP VP Percent: 15.69 %
Brady Statistic AP VS Percent: 77.37 %
Brady Statistic AS VP Percent: 1.07 %
Brady Statistic AS VS Percent: 5.87 %
Brady Statistic RA Percent Paced: 93.14 %
Brady Statistic RV Percent Paced: 16.76 %
Date Time Interrogation Session: 20210408111253
Implantable Lead Implant Date: 20090527
Implantable Lead Implant Date: 20090527
Implantable Lead Location: 753859
Implantable Lead Location: 753860
Implantable Lead Model: 5076
Implantable Lead Model: 5076
Implantable Pulse Generator Implant Date: 20210322
Lead Channel Impedance Value: 323 Ohm
Lead Channel Impedance Value: 323 Ohm
Lead Channel Impedance Value: 437 Ohm
Lead Channel Impedance Value: 475 Ohm
Lead Channel Pacing Threshold Amplitude: 0.625 V
Lead Channel Pacing Threshold Amplitude: 0.875 V
Lead Channel Pacing Threshold Pulse Width: 0.4 ms
Lead Channel Pacing Threshold Pulse Width: 0.4 ms
Lead Channel Sensing Intrinsic Amplitude: 1.625 mV
Lead Channel Sensing Intrinsic Amplitude: 1.625 mV
Lead Channel Sensing Intrinsic Amplitude: 3.875 mV
Lead Channel Sensing Intrinsic Amplitude: 3.875 mV
Lead Channel Setting Pacing Amplitude: 1.75 V
Lead Channel Setting Pacing Amplitude: 2.5 V
Lead Channel Setting Pacing Pulse Width: 0.4 ms
Lead Channel Setting Sensing Sensitivity: 0.9 mV

## 2019-08-02 NOTE — Progress Notes (Signed)
Manual PPM transmission sent in to accompany virtual wound check. Dermabond partially removed prior to virtual visit. Wound  appears without redness or edema. Incision edges appear approximated, wound healing well. Normal device function per transmission. Thresholds, sensing, and impedances trends consistent with implant measurements; R Wave amplitude low but stable (1.3 mV at gen change). Device programmed at chronic output/auto capture programmed on for status post gen change. Histogram distribution appropriate for patient and level of activity. No mode switches. 1 high ventricular rates, 6 beats, NSVT noted. Patient educated about wound care, arm mobility, lifting restrictions. ROV in 3 months with implanting physician. [facility] on 09/26/19. ROV w/ Dr. Caryl Comes 10/30/19.

## 2019-08-06 ENCOUNTER — Encounter: Payer: Self-pay | Admitting: *Deleted

## 2019-08-13 ENCOUNTER — Ambulatory Visit: Payer: Medicare Other | Admitting: Psychology

## 2019-08-27 ENCOUNTER — Ambulatory Visit (INDEPENDENT_AMBULATORY_CARE_PROVIDER_SITE_OTHER): Payer: Medicare Other | Admitting: Psychology

## 2019-08-27 DIAGNOSIS — F332 Major depressive disorder, recurrent severe without psychotic features: Secondary | ICD-10-CM | POA: Diagnosis not present

## 2019-09-06 ENCOUNTER — Telehealth: Payer: Medicare Other | Admitting: Internal Medicine

## 2019-09-10 ENCOUNTER — Ambulatory Visit: Payer: Medicare Other | Admitting: Psychology

## 2019-09-11 ENCOUNTER — Other Ambulatory Visit: Payer: Self-pay

## 2019-09-11 ENCOUNTER — Telehealth (INDEPENDENT_AMBULATORY_CARE_PROVIDER_SITE_OTHER): Payer: Medicare Other | Admitting: Psychiatry

## 2019-09-11 DIAGNOSIS — F330 Major depressive disorder, recurrent, mild: Secondary | ICD-10-CM | POA: Diagnosis not present

## 2019-09-11 DIAGNOSIS — F411 Generalized anxiety disorder: Secondary | ICD-10-CM | POA: Diagnosis not present

## 2019-09-11 MED ORDER — ESCITALOPRAM OXALATE 10 MG PO TABS: 10.0000 mg | ORAL_TABLET | Freq: Every day | ORAL | 0 refills | Status: DC

## 2019-09-11 MED ORDER — LORAZEPAM 0.5 MG PO TABS: 0.5000 mg | ORAL_TABLET | Freq: Every day | ORAL | 2 refills | Status: DC | PRN

## 2019-09-11 NOTE — Progress Notes (Signed)
Virtual Visit via Telephone Note  I connected with [name] on 09/11/19 at  2:40 PM EDT by telephone and verified that I am speaking with the correct person using two identifiers.   I discussed the limitations, risks, security and privacy concerns of performing an evaluation and management service by telephone and the availability of in person appointments. I also discussed with the patient that there may be a patient responsible charge related to this service. The patient expressed understanding and agreed to proceed.   History of Present Illness: Patient is evaluated by phone session.  She reported that she has been not taking the medication since March since she moved to her son's ex-wife.  Patient was not comfortable living with her daughter and decided to move out because she was not getting along with her daughter's husband.  When she moved to her son's ex-wife she stop medicine because her son does not believe in psychiatric medication.  Patient told that her son is separated and divorced but he had a house and she preferred to stay in their house until she find a permanent place.  She admitted since not taking the medication she is very anxious, not able to sleep, having anxiety depression and crying spells.  She feels that she needs to have her own place so she can enjoy her freedom.  She was living in [facility] but she could not afford but now her son is trying to find a place for her.  She denies any suicidal thoughts.  Her energy level is fair.  She denies any hallucination, paranoia or any anger.  Her biggest stress is her living situation.  She like to go back on lorazepam and Lexapro which has helped her in the past.  She has now pacemaker and she is seeing cardiology.   Past Psychiatric History:Reviewed. H/O one inpatient due to suicidal thinking. No h/o suicidal attempt, mania or psychosis.    Psychiatric Specialty Exam: Physical Exam  Review of Systems  There were no  vitals taken for this visit.There is no height or weight on file to calculate BMI.  General Appearance: NA  Eye Contact:  NA  Speech:  Slow  Volume:  Decreased  Mood:  Anxious and Depressed  Affect:  Negative  Thought Process:  Goal Directed  Orientation:  Full (Time, Place, and Person)  Thought Content:  Rumination  Suicidal Thoughts:  No  Homicidal Thoughts:  No  Memory:  Immediate;   Good Recent;   Good Remote;   Fair  Judgement:  Fair  Insight:  Fair  Psychomotor Activity:  NA  Concentration:  Concentration: Fair and Attention Span: Fair  Recall:  [employer] of Knowledge:  Good  Language:  Good  Akathisia:  No  Handed:  Right  AIMS (if indicated):     Assets:  Communication Skills Desire for Improvement Resilience  ADL's:  Intact  Cognition:  WNL  Sleep:   poor      Assessment and Plan: Major depressive disorder, recurrent.  Generalized anxiety disorder.  I reviewed blood work results.  Patient is not taking medication and slowly and gradually decompensated.  She like to go back on medication but also wants to have her own place so she can have her own freedom.  I agree.  We will resume Lexapro 10 mg daily and lorazepam 0.5 mg as needed for anxiety.  She is in in therapy with Dr. Cheryln Manly virtually.  I encouraged to continue therapy. Recommended to call us  back if she has any questions or any concerns.  Follow-up in 3 months.  Follow Up Instructions:    I discussed the assessment and treatment plan with the patient. The patient was provided an opportunity to ask questions and all were answered. The patient agreed with the plan and demonstrated an understanding of the instructions.   The patient was advised to call back or seek an in-person evaluation if the symptoms worsen or if the condition fails to improve as anticipated.  I provided 20 minutes of non-face-to-face time during this encounter.   Kathlee Nations, MD

## 2019-09-25 ENCOUNTER — Other Ambulatory Visit: Payer: Self-pay | Admitting: Internal Medicine

## 2019-09-25 MED ORDER — APIXABAN 5 MG PO TABS: 5.0000 mg | ORAL_TABLET | Freq: Two times a day (BID) | ORAL | 0 refills | Status: DC

## 2019-09-25 NOTE — Telephone Encounter (Signed)
Reviewed chart pt is up-to-date sent refills to mail order../[facility]mb  

## 2019-09-25 NOTE — Telephone Encounter (Signed)
New message:   1.Medication Requested: apixaban (ELIQUIS) 5 MG TABS tablet 2. Pharmacy (Name, [place]): [place], [place] 3. On Med List: Yes  4. Last Visit with PCP: 02/13/19  5. Next visit date with PCP: None   Agent: Please be advised that RX refills may take up to 3 business days. We ask that you follow-up with your pharmacy.

## 2019-09-27 ENCOUNTER — Other Ambulatory Visit (HOSPITAL_COMMUNITY): Payer: Self-pay | Admitting: Cardiology

## 2019-10-08 ENCOUNTER — Ambulatory Visit (INDEPENDENT_AMBULATORY_CARE_PROVIDER_SITE_OTHER): Payer: Medicare Other | Admitting: Psychology

## 2019-10-08 DIAGNOSIS — F332 Major depressive disorder, recurrent severe without psychotic features: Secondary | ICD-10-CM | POA: Diagnosis not present

## 2019-10-09 ENCOUNTER — Encounter: Payer: Self-pay | Admitting: Internal Medicine

## 2019-10-09 ENCOUNTER — Other Ambulatory Visit: Payer: Self-pay

## 2019-10-09 ENCOUNTER — Ambulatory Visit (INDEPENDENT_AMBULATORY_CARE_PROVIDER_SITE_OTHER): Payer: Medicare Other | Admitting: Internal Medicine

## 2019-10-09 VITALS — BP 130/88 | HR 78 | Temp 98.3°F | Ht 64.0 in | Wt 141.0 lb

## 2019-10-09 DIAGNOSIS — E039 Hypothyroidism, unspecified: Secondary | ICD-10-CM | POA: Diagnosis not present

## 2019-10-09 DIAGNOSIS — F330 Major depressive disorder, recurrent, mild: Secondary | ICD-10-CM

## 2019-10-09 DIAGNOSIS — F419 Anxiety disorder, unspecified: Secondary | ICD-10-CM

## 2019-10-09 DIAGNOSIS — R634 Abnormal weight loss: Secondary | ICD-10-CM

## 2019-10-09 DIAGNOSIS — F320 Major depressive disorder, single episode, mild: Secondary | ICD-10-CM | POA: Diagnosis not present

## 2019-10-09 DIAGNOSIS — I2699 Other pulmonary embolism without acute cor pulmonale: Secondary | ICD-10-CM

## 2019-10-09 DIAGNOSIS — F411 Generalized anxiety disorder: Secondary | ICD-10-CM

## 2019-10-09 MED ORDER — LORAZEPAM 0.5 MG PO TABS: 0.5000 mg | ORAL_TABLET | Freq: Every day | ORAL | 0 refills | Status: DC | PRN

## 2019-10-09 MED ORDER — ESCITALOPRAM OXALATE 10 MG PO TABS: 10.0000 mg | ORAL_TABLET | Freq: Every day | ORAL | 1 refills | Status: DC

## 2019-10-09 MED ORDER — CETIRIZINE HCL 10 MG PO TABS: 10.0000 mg | ORAL_TABLET | Freq: Every day | ORAL | 3 refills | Status: DC

## 2019-10-09 MED ORDER — SYNTHROID 100 MCG PO TABS: 100.0000 ug | ORAL_TABLET | Freq: Every day | ORAL | 1 refills | Status: DC

## 2019-10-09 MED ORDER — TOLTERODINE TARTRATE ER 2 MG PO CP24: 2.0000 mg | ORAL_CAPSULE | Freq: Every day | ORAL | 3 refills | Status: DC

## 2019-10-09 MED ORDER — APIXABAN 5 MG PO TABS: 5.0000 mg | ORAL_TABLET | Freq: Two times a day (BID) | ORAL | 1 refills | Status: DC

## 2019-10-09 NOTE — Assessment & Plan Note (Signed)
Discuss diet Labs

## 2019-10-09 NOTE — Assessment & Plan Note (Signed)
Worse w/OCD Re-start Lexapro, prn Lorazepam F/u w/dr Cheryln Manly   Potential benefits of a long term benzodiazepines  use as well as potential risks  and complications were explained to the patient and were aknowledged.

## 2019-10-09 NOTE — Assessment & Plan Note (Signed)
On Levothroid Labs

## 2019-10-09 NOTE — Assessment & Plan Note (Addendum)
Worse Re-start Lexapro F/u w/dr Cheryln Manly

## 2019-10-09 NOTE — Progress Notes (Signed)
Subjective:  Patient ID: [name], female    DOB: 1929-11-16  Age: 86 y.o. MRN: [id number]  CC: No chief complaint on file.   HPI Sarina Robleto presents for s/p pacemaker placement in 06/2019  C/o IBS, wt loss, anxiety. "My son took my meds from me in March"  07/25/19 ER visit: "She presents the emergency department by EMS for evaluation following two unwitnessed falls. She was found on the floor by her bed both times. Patient is unsure why she is in the emergency department. She states she woke up on the floor but does not recall falling. She denies any pain or injuries. She denies any recent illnesses. Per EMS report family states she is more lethargic. She did have a pacemaker placed last week and takes eliquis for history of PE."  Dtr moved her out of [facility] in Oct 2020 to live w/dtr    Outpatient Medications Prior to Visit  Medication Sig Dispense Refill  . LORazepam (ATIVAN) 0.5 MG tablet Take 1 tablet (0.5 mg total) by mouth daily as needed for anxiety. 30 tablet 2  . apixaban (ELIQUIS) 5 MG TABS tablet Take 1 tablet (5 mg total) by mouth 2 (two) times daily. Annual appt is due must see provider for future refills (Patient not taking: Reported on 10/09/2019) 180 tablet 0  . cetirizine (ZYRTEC) 10 MG tablet Take 10 mg by mouth daily.  (Patient not taking: Reported on 10/09/2019)    . cholecalciferol (VITAMIN D3) 25 MCG (1000 UNIT) tablet Take 1,000 Units by mouth daily. (Patient not taking: Reported on 10/09/2019)    . clotrimazole-betamethasone (LOTRISONE) cream Apply 1 application topically in the morning and at bedtime. (Patient not taking: Reported on 10/09/2019)    . escitalopram (LEXAPRO) 10 MG tablet Take 1 tablet (10 mg total) by mouth at bedtime. (Patient not taking: Reported on 10/09/2019) 90 tablet 0  . famotidine (PEPCID) 10 MG tablet Take 10 mg by mouth 2 (two) times daily. (Patient not taking: Reported on 10/09/2019)    . magnesium oxide (MAG-OX) 400 MG tablet Take  0.5 tablets (200 mg total) by mouth daily. Needs appt for further refills (Patient not taking: Reported on 10/09/2019) 45 tablet 0  . REFRESH TEARS 0.5 % SOLN Place 1 drop into both eyes daily as needed (dry eyes).  (Patient not taking: Reported on 10/09/2019)  0  . spironolactone (ALDACTONE) 25 MG tablet Take 1 tablet (25 mg total) by mouth daily. (Patient not taking: Reported on 10/09/2019) 90 tablet 1  . SYNTHROID 100 MCG tablet Take 1 tablet (100 mcg total) by mouth daily. (Patient not taking: Reported on 10/09/2019) 90 tablet 1  . tolterodine (DETROL LA) 2 MG 24 hr capsule Take 1 capsule (2 mg total) by mouth daily. (Patient not taking: Reported on 10/09/2019) 90 capsule 1   No facility-administered medications prior to visit.    ROS: Review of Systems  Constitutional: Positive for fatigue. Negative for activity change, appetite change, chills and unexpected weight change.  HENT: Negative for congestion, mouth sores and sinus pressure.   Eyes: Negative for visual disturbance.  Respiratory: Negative for cough and chest tightness.   Cardiovascular: Negative for palpitations.  Gastrointestinal: Negative for abdominal pain and nausea.  Genitourinary: Negative for difficulty urinating, frequency and vaginal pain.  Musculoskeletal: Negative for back pain and gait problem.  Skin: Negative for pallor and rash.  Neurological: Negative for dizziness, tremors, weakness, numbness and headaches.  Psychiatric/Behavioral: Positive for agitation, decreased concentration, dysphoric mood and sleep  disturbance. Negative for confusion, hallucinations, self-injury and suicidal ideas. The patient is nervous/anxious.     Objective:  BP 130/88 (BP Location: Right Arm, Patient Position: Sitting, Cuff Size: Normal)   Pulse 78   Temp 98.3 F (36.8 C)   Ht 5\' 4"  (1.626 m)   Wt 141 lb (64 kg)   SpO2 99%   BMI 24.20 kg/m   BP Readings from Last 3 Encounters:  10/09/19 130/88  07/25/19 110/69  07/16/19 129/74     Wt Readings from Last 3 Encounters:  10/09/19 141 lb (64 kg)  07/25/19 154 lb (69.9 kg)  07/16/19 154 lb (69.9 kg)    Physical Exam Constitutional:      General: She is not in acute distress.    Appearance: She is well-developed.  HENT:     Head: Normocephalic.     Right Ear: External ear normal.     Left Ear: External ear normal.     Nose: Nose normal.  Eyes:     General:        Right eye: No discharge.        Left eye: No discharge.     Conjunctiva/sclera: Conjunctivae normal.     Pupils: Pupils are equal, round, and reactive to light.  Neck:     Thyroid: No thyromegaly.     Vascular: No JVD.     Trachea: No tracheal deviation.  Cardiovascular:     Rate and Rhythm: Normal rate and regular rhythm.     Heart sounds: Normal heart sounds.  Pulmonary:     Effort: No respiratory distress.     Breath sounds: No stridor. No wheezing.  Abdominal:     General: Bowel sounds are normal. There is no distension.     Palpations: Abdomen is soft. There is no mass.     Tenderness: There is no abdominal tenderness. There is no guarding or rebound.  Musculoskeletal:        General: No tenderness.     Cervical back: Normal range of motion and neck supple.  Lymphadenopathy:     Cervical: No cervical adenopathy.  Skin:    Findings: No erythema or rash.  Neurological:     Mental Status: She is oriented to person, place, and time.     Cranial Nerves: No cranial nerve deficit.     Motor: No abnormal muscle tone.     Coordination: Coordination abnormal.     Gait: Gait abnormal.     Deep Tendon Reflexes: Reflexes normal.  Psychiatric:        Behavior: Behavior normal.        Thought Content: Thought content normal.        Judgment: Judgment normal.    Anxious, tearful   Lab Results  Component Value Date   WBC 6.9 07/25/2019   HGB 14.0 07/25/2019   HCT 42.7 07/25/2019   PLT 165 07/25/2019   GLUCOSE 107 (H) 07/25/2019   CHOL 174 05/11/2010   TRIG 78.0 05/11/2010   HDL  52.30 05/11/2010   LDLDIRECT 130.8 09/30/2006   LDLCALC 106 (H) 05/11/2010   ALT 26 07/25/2019   AST 35 07/25/2019   NA 136 07/25/2019   K 5.5 (H) 07/25/2019   CL 102 07/25/2019   CREATININE 1.23 (H) 07/25/2019   BUN 36 (H) 07/25/2019   CO2 26 07/25/2019   TSH 0.74 02/01/2018   INR 1.08 06/30/2016   HGBA1C 6.0 04/10/2012    CT Head Wo Contrast  Result Date: 07/25/2019 CLINICAL  DATA:  Un witnessed fall x2 EXAM: CT HEAD WITHOUT CONTRAST CT CERVICAL SPINE WITHOUT CONTRAST TECHNIQUE: Multidetector CT imaging of the head and cervical spine was performed following the standard protocol without intravenous contrast. Multiplanar CT image reconstructions of the cervical spine were also generated. COMPARISON:  02/26/2018 FINDINGS: CT HEAD FINDINGS Brain: No acute infarct or hemorrhage. Lateral ventricles and midline structures are unremarkable. No acute extra-axial fluid collections. No mass effect. Vascular: No hyperdense vessel or unexpected calcification. Skull: Normal. Negative for fracture or focal lesion. Sinuses/Orbits: No acute finding. Other: None CT CERVICAL SPINE FINDINGS Alignment: Alignment is anatomic. Skull base and vertebrae: No acute displaced fractures. Soft tissues and spinal canal: No prevertebral fluid or swelling. No visible canal hematoma. Disc levels: There is multilevel disc space narrowing and osteophyte formation, most pronounced at the C5-6 and C6-7 levels. Right predominant uncovertebral hypertrophy at C3/C4 results in significant right-sided neural foraminal encroachment. Upper chest: Airway is patent.  Lung apices are clear. Other: Reconstructed images demonstrate no additional findings. IMPRESSION: 1. No acute intracranial process. 2. Cervical spondylosis, no acute fracture. Electronically Signed   By: Randa Ngo M.D.   On: 07/25/2019 16:18   CT Cervical Spine Wo Contrast  Result Date: 07/25/2019 CLINICAL DATA:  Un witnessed fall x2 EXAM: CT HEAD WITHOUT CONTRAST CT  CERVICAL SPINE WITHOUT CONTRAST TECHNIQUE: Multidetector CT imaging of the head and cervical spine was performed following the standard protocol without intravenous contrast. Multiplanar CT image reconstructions of the cervical spine were also generated. COMPARISON:  02/26/2018 FINDINGS: CT HEAD FINDINGS Brain: No acute infarct or hemorrhage. Lateral ventricles and midline structures are unremarkable. No acute extra-axial fluid collections. No mass effect. Vascular: No hyperdense vessel or unexpected calcification. Skull: Normal. Negative for fracture or focal lesion. Sinuses/Orbits: No acute finding. Other: None CT CERVICAL SPINE FINDINGS Alignment: Alignment is anatomic. Skull base and vertebrae: No acute displaced fractures. Soft tissues and spinal canal: No prevertebral fluid or swelling. No visible canal hematoma. Disc levels: There is multilevel disc space narrowing and osteophyte formation, most pronounced at the C5-6 and C6-7 levels. Right predominant uncovertebral hypertrophy at C3/C4 results in significant right-sided neural foraminal encroachment. Upper chest: Airway is patent.  Lung apices are clear. Other: Reconstructed images demonstrate no additional findings. IMPRESSION: 1. No acute intracranial process. 2. Cervical spondylosis, no acute fracture. Electronically Signed   By: Randa Ngo M.D.   On: 07/25/2019 16:18   DG Chest Port 1 View  Result Date: 07/25/2019 CLINICAL DATA:  Two unwitnessed falls today. EXAM: PORTABLE CHEST 1 VIEW COMPARISON:  Radiographs 02/16/2017 and 06/22/2018.  CT 02/03/2018. FINDINGS: 1524 hours. Right subclavian pacemaker leads appear unchanged. The heart size is stable at the upper limits of normal. There is aortic atherosclerosis. The lungs are clear. There is no pleural effusion or pneumothorax. No acute fractures are seen. Patient appears to be status post bilateral mastectomy and left axillary node dissection. The subacromial space of the right shoulder is  narrowed, consistent with a chronic rotator cuff tear. IMPRESSION: No evidence of acute chest injury or active cardiopulmonary process. Electronically Signed   By: Richardean Sale M.D.   On: 07/25/2019 15:37    Assessment & Plan:    Walker Kehr, MD

## 2019-10-09 NOTE — Assessment & Plan Note (Signed)
Eliquis 

## 2019-10-10 ENCOUNTER — Other Ambulatory Visit: Payer: Self-pay | Admitting: Internal Medicine

## 2019-10-10 MED ORDER — APIXABAN 5 MG PO TABS: 5.0000 mg | ORAL_TABLET | Freq: Two times a day (BID) | ORAL | 1 refills | Status: DC

## 2019-10-10 NOTE — Telephone Encounter (Signed)
   Patient has no medication remaining Patient requesting apixaban (ELIQUIS) 5 MG TABS [id number] [place] [place] - [address] AT [place]

## 2019-10-10 NOTE — Telephone Encounter (Signed)
Spoke with pt and informed her there is prescription for a 10 day supply sent to pharmacy requested.

## 2019-10-16 ENCOUNTER — Ambulatory Visit (INDEPENDENT_AMBULATORY_CARE_PROVIDER_SITE_OTHER): Payer: Medicare Other | Admitting: *Deleted

## 2019-10-16 DIAGNOSIS — I495 Sick sinus syndrome: Secondary | ICD-10-CM

## 2019-10-16 LAB — CUP PACEART REMOTE DEVICE CHECK
Battery Remaining Longevity: 150 mo
Battery Voltage: 3.21 V
Brady Statistic AP VP Percent: 25.41 %
Brady Statistic AP VS Percent: 60 %
Brady Statistic AS VP Percent: 3.76 %
Brady Statistic AS VS Percent: 10.83 %
Brady Statistic RA Percent Paced: 87.18 %
Brady Statistic RV Percent Paced: 29.17 %
Date Time Interrogation Session: 20210622023057
Implantable Lead Implant Date: 20090527
Implantable Lead Implant Date: 20090527
Implantable Lead Location: 753859
Implantable Lead Location: 753860
Implantable Lead Model: 5076
Implantable Lead Model: 5076
Implantable Pulse Generator Implant Date: 20210322
Lead Channel Impedance Value: 323 Ohm
Lead Channel Impedance Value: 361 Ohm
Lead Channel Impedance Value: 437 Ohm
Lead Channel Impedance Value: 456 Ohm
Lead Channel Pacing Threshold Amplitude: 0.625 V
Lead Channel Pacing Threshold Amplitude: 0.75 V
Lead Channel Pacing Threshold Pulse Width: 0.4 ms
Lead Channel Pacing Threshold Pulse Width: 0.4 ms
Lead Channel Sensing Intrinsic Amplitude: 1.875 mV
Lead Channel Sensing Intrinsic Amplitude: 1.875 mV
Lead Channel Sensing Intrinsic Amplitude: 3.875 mV
Lead Channel Sensing Intrinsic Amplitude: 3.875 mV
Lead Channel Setting Pacing Amplitude: 1.5 V
Lead Channel Setting Pacing Amplitude: 2.5 V
Lead Channel Setting Pacing Pulse Width: 0.4 ms
Lead Channel Setting Sensing Sensitivity: 0.9 mV

## 2019-10-17 NOTE — Progress Notes (Signed)
Remote pacemaker transmission.   

## 2019-10-22 ENCOUNTER — Telehealth: Payer: Self-pay

## 2019-10-22 ENCOUNTER — Ambulatory Visit (INDEPENDENT_AMBULATORY_CARE_PROVIDER_SITE_OTHER): Payer: Medicare Other | Admitting: Psychology

## 2019-10-22 DIAGNOSIS — F331 Major depressive disorder, recurrent, moderate: Secondary | ICD-10-CM

## 2019-10-22 NOTE — Telephone Encounter (Signed)
Divon with [facility] calling and is requesting the okay for patient to get an early refill on the SYNTHROID 100 MCG tablet Please advise.   CB#: [phone] Ref#: [id number]

## 2019-10-23 NOTE — Telephone Encounter (Signed)
Spoke with pharmacy and they state that pt filled Synthroid medication at the retail pharmacy one day before mail order was received. Pharmacy will put Rx on 90 day hold until Pt is due to fill. Pharmacy stated they dispensed the generic form, levothyroxine.

## 2019-10-30 ENCOUNTER — Telehealth: Payer: Self-pay

## 2019-10-30 ENCOUNTER — Encounter: Payer: Medicare Other | Admitting: Internal Medicine

## 2019-10-30 NOTE — Telephone Encounter (Signed)
Spoke with pt and reminded she was scheduled to see Dr Caryl Comes today at [facility] office.  Pt states she had forgotten but her children have told her they will not bring her to appointment for pacemaker as she did not need a pacemaker.  Pt asking if she can schedule a virtual appointment.  Appointment scheduled for 11/01/2019 at 145pm.  Pt verbalizes understanding and agrees with current plan.

## 2019-10-30 NOTE — Progress Notes (Deleted)
Patient Care Team: Plotnikov, Evie Lacks, MD as PCP - General Sueanne Margarita, MD as PCP - Sleep Medicine (Cardiology) Adele Schilder, Arlyce Harman, MD (Psychiatry) Richmond Campbell, MD (Gastroenterology) Fay Records, MD (Cardiology) Johnathan Hausen, MD (General Surgery) Melrose Nakayama, MD as Consulting Physician (Orthopedic Surgery) Larey Dresser, MD as Consulting Physician (Cardiology) Deboraha Sprang, MD as Consulting Physician (Cardiology) Brand Males, MD as Consulting Physician (Pulmonary Disease) Alda Berthold, DO as Consulting Physician (Neurology)   HPI  [name] is a 86 y.o. female seen in followup for orthostatic intolerance, exercise intolerance, and prior pacemaker implantation for sinus node dysfunction.   . Stress echo 2012 normal LV function.   Echo (7/17): EF 55-60%, mild MR with mitral valve prolapse, moderate TR, normal RV size and systolic function, PASP 30 mmHg.  -Lexiscan Cardiolite (9/17) with EF 55%, no ischemia/infarction - RHC (10/17) with mean RA 6, PA 28/9, mean PCWP 7, CI 2.37     DVT and pulmonary embolism.  On apixaban.     Chronic DOE  No edema no chest pain   Currently at [facility] but will soon be moving in with her daughter     Date Cr K Hgb  12/19 1.18 4.5 12.5  3/20 1.24 4.6 13.7      Past Medical History:  Diagnosis Date  . Anxiety   . Bradycardia   . Breast cancer, left breast ([place]) 1995  . Breast cancer, right breast ([place]) 2002  . CHF (congestive heart failure) ([place])   . Depression    Dr Cheryln Manly  . Discoid lupus    skin  . Full dentures   . GERD (gastroesophageal reflux disease)   . Glaucoma   . Gout   . Hx of echocardiogram    a.  Echocardiogram (03/2011): Mild LVH, EF 57-84%, grade 1 diastolic dysfunction, mild MR, mild to moderate TR, PASP 42;  b. Echo (05/2013):  Mod LVH, EF 65-70%, no WMA, Gr 1 DD, mild MR, mod TR, PASP 39  . Hx of exercise stress test    a. ETT-echocardiogram (06/08/11): Sub-optimal  exercise. Test stopped early due to dizziness and hypotension. EF 60%.  Non-diagnostic.;   b. Lexiscan Myoview (06/2013):  Diaphragmatic attenuation, no ischemia, EF 61%.  Low Risk  . Hypertension   . Hypothyroidism   . IBS (irritable bowel syndrome)   . Incontinence   . OSA (obstructive sleep apnea) 07/20/2016   Mild with AHI 12/hr now on CPAP  . Osteoarthritis   . Osteopenia   . Pacemaker 2009   Brady//Chronotropic incompetence with normal pacemaker function  . Thyroid disease   . Wears hearing aid    both ears    Past Surgical History:  Procedure Laterality Date  . BREAST BIOPSY Left 1995  . BREAST LUMPECTOMY Left 1995   axillary node dissectoin  . CARDIAC CATHETERIZATION N/A 02/13/2016   Procedure: Right Heart Cath;  Surgeon: Larey Dresser, MD;  Location: [facility];  Service: Cardiovascular;  Laterality: N/A;  . CATARACT EXTRACTION W/ INTRAOCULAR LENS  IMPLANT, BILATERAL Bilateral 06/2009 - 12.2915   left - right  . DORSAL COMPARTMENT RELEASE  2000   left  . HAMMER TOE SURGERY Left 2004  . HEMORRHOID BANDING    . INSERT / REPLACE / REMOVE PACEMAKER  2009  . KNEE ARTHROSCOPY Left 08/29/2012   Procedure: LEFT KNEE ARTHROSCOPY ;  Surgeon: Hessie Dibble, MD;  Location: [facility];  Service: Orthopedics;  Laterality: Left;  [name] MASTECTOMY Bilateral 09/2000   nbx  . PPM GENERATOR CHANGEOUT N/A 07/16/2019   Procedure: PPM GENERATOR CHANGEOUT;  Surgeon: Deboraha Sprang, MD;  Location: [facility];  Service: Cardiovascular;  Laterality: N/A;  . TRIGGER FINGER RELEASE Right 07/31/2013   Procedure: EXCISE MASS RIGHT INDEX A-1 PLLLEY RELEASE A-1 RIGHT INDEX;  Surgeon: Cammie Sickle., MD;  Location: [facility];  Service: Orthopedics;  Laterality: Right;  [name] VAGINAL HYSTERECTOMY      Current Outpatient Medications  Medication Sig Dispense Refill  . apixaban (ELIQUIS) 5 MG TABS tablet Take 1 tablet (5 mg total) by mouth 2 (two) times daily.  Annual appt is due must see provider for future refills 10 tablet 1  . cetirizine (ZYRTEC) 10 MG tablet Take 1 tablet (10 mg total) by mouth daily. 100 tablet 3  . cholecalciferol (VITAMIN D3) 25 MCG (1000 UNIT) tablet Take 1,000 Units by mouth daily. (Patient not taking: Reported on 10/09/2019)    . clotrimazole-betamethasone (LOTRISONE) cream Apply 1 application topically in the morning and at bedtime. (Patient not taking: Reported on 10/09/2019)    . escitalopram (LEXAPRO) 10 MG tablet Take 1 tablet (10 mg total) by mouth at bedtime. 90 tablet 1  . famotidine (PEPCID) 10 MG tablet Take 10 mg by mouth 2 (two) times daily. (Patient not taking: Reported on 10/09/2019)    . LORazepam (ATIVAN) 0.5 MG tablet Take 1 tablet (0.5 mg total) by mouth daily as needed for anxiety. 90 tablet 0  . magnesium oxide (MAG-OX) 400 MG tablet Take 0.5 tablets (200 mg total) by mouth daily. Needs appt for further refills (Patient not taking: Reported on 10/09/2019) 45 tablet 0  . REFRESH TEARS 0.5 % SOLN Place 1 drop into both eyes daily as needed (dry eyes).  (Patient not taking: Reported on 10/09/2019)  0  . spironolactone (ALDACTONE) 25 MG tablet Take 1 tablet (25 mg total) by mouth daily. (Patient not taking: Reported on 10/09/2019) 90 tablet 1  . SYNTHROID 100 MCG tablet Take 1 tablet (100 mcg total) by mouth daily. 90 tablet 1  . tolterodine (DETROL LA) 2 MG 24 hr capsule Take 1 capsule (2 mg total) by mouth daily. 90 capsule 3   No current facility-administered medications for this visit.    Allergies  Allergen Reactions  . Lactase Diarrhea  . Amlodipine Besy-Benazepril Hcl Swelling  . Amlodipine Besylate Swelling  . Clonidine Hydrochloride     REACTION: tired  . Lactose Intolerance (Gi) Diarrhea and Other (See Comments)  . Valsartan Other (See Comments)    Tongue swelling   . Verapamil Other (See Comments)    unknown    Review of Systems negative except from HPI and PMH  Physical Exam There were no  vitals taken for this visit. Well developed and well nourished in no acute distress HENT normal Neck supple with JVP-flat Clear Device pocket well healed; without hematoma or erythema.  There is no tethering  Regular rate and rhythm, no *** gallop No ***/*** murmur Abd-soft with active BS No Clubbing cyanosis *** edema Skin-warm and dry A & Oriented  Grossly normal sensory and motor function  ECG ***     Assessment and  Plan  Sinus node dysfunction   Pacemaker  [employer] ***  Hypertension  Dyspnea-multifactorial  Depression  DVT PE  ***

## 2019-11-01 ENCOUNTER — Other Ambulatory Visit: Payer: Self-pay

## 2019-11-01 ENCOUNTER — Telehealth: Payer: Self-pay

## 2019-11-01 ENCOUNTER — Telehealth (INDEPENDENT_AMBULATORY_CARE_PROVIDER_SITE_OTHER): Payer: Medicare Other | Admitting: Internal Medicine

## 2019-11-01 VITALS — BP 130/86 | HR 96 | Wt 141.0 lb

## 2019-11-01 DIAGNOSIS — Z950 Presence of cardiac pacemaker: Secondary | ICD-10-CM

## 2019-11-01 DIAGNOSIS — I951 Orthostatic hypotension: Secondary | ICD-10-CM

## 2019-11-01 DIAGNOSIS — I495 Sick sinus syndrome: Secondary | ICD-10-CM

## 2019-11-01 NOTE — Telephone Encounter (Signed)
  Patient Consent for Virtual Visit         [name] has provided verbal consent on 11/01/2019 for a virtual visit (video or telephone).   CONSENT FOR VIRTUAL VISIT FOR:  [name]  By participating in this virtual visit I agree to the following:  I hereby voluntarily request, consent and authorize [facility] and its employed or contracted physicians, physician assistants, nurse practitioners or other licensed health care professionals (the Practitioner), to provide me with telemedicine health care services (the "Services") as deemed necessary by the treating Practitioner. I acknowledge and consent to receive the Services by the Practitioner via telemedicine. I understand that the telemedicine visit will involve communicating with the Practitioner through live audiovisual communication technology and the disclosure of certain medical information by electronic transmission. I acknowledge that I have been given the opportunity to request an in-person assessment or other available alternative prior to the telemedicine visit and am voluntarily participating in the telemedicine visit.  I understand that I have the right to withhold or withdraw my consent to the use of telemedicine in the course of my care at any time, without affecting my right to future care or treatment, and that the Practitioner or I may terminate the telemedicine visit at any time. I understand that I have the right to inspect all information obtained and/or recorded in the course of the telemedicine visit and may receive copies of available information for a reasonable fee.  I understand that some of the potential risks of receiving the Services via telemedicine include:  [name] Delay or interruption in medical evaluation due to technological equipment failure or disruption; . Information transmitted may not be sufficient (e.g. poor resolution of images) to allow for appropriate medical decision making by the Practitioner; and/or    . In rare instances, security protocols could fail, causing a breach of personal health information.  Furthermore, I acknowledge that it is my responsibility to provide information about my medical history, conditions and care that is complete and accurate to the best of my ability. I acknowledge that Practitioner's advice, recommendations, and/or decision may be based on factors not within their control, such as incomplete or inaccurate data provided by me or distortions of diagnostic images or specimens that may result from electronic transmissions. I understand that the practice of medicine is not an exact science and that Practitioner makes no warranties or guarantees regarding treatment outcomes. I acknowledge that a copy of this consent can be made available to me via my patient portal ([facility]), or I can request a printed copy by calling the office of [facility].    I understand that my insurance will be billed for this visit.   I have read or had this consent read to me. . I understand the contents of this consent, which adequately explains the benefits and risks of the Services being provided via telemedicine.  . I have been provided ample opportunity to ask questions regarding this consent and the Services and have had my questions answered to my satisfaction. . I give my informed consent for the services to be provided through the use of telemedicine in my medical care

## 2019-11-01 NOTE — Progress Notes (Signed)
Electrophysiology TeleHealth Note   Due to national recommendations of social distancing due to COVID 19, an audio/video telehealth visit is felt to be most appropriate for this patient at this time.  See MyChart message from today for the patient's consent to telehealth for [facility].   Date:  11/01/2019   ID:  [name], DOB July 30, 1929, MRN [id number]  Location: patient's home  Provider location: [address], [place]  Evaluation Performed: Follow-up visit  PCP:  Plotnikov, Evie Lacks, MD  Cardiologist:    Electrophysiologist:  SK   Chief Complaint:  Pacemaker   History of Present Illness:    [name] is a 86 y.o. female who presents via audio/video conferencing for a telehealth visit today.  Since last being seen in our clinic clinic for pacemaker  in setting of sinus node dysfunction and normal LV function and orthostatic intolerance for which she underwent genchange which went well but otherwise things have not gone well  She moved out from her daughters and then she told me a story that was not altogether clear to me about her son taking away her medicines as he felt that they were bad for her, that she had not had them since April, that she was not wliving in a room with her ex daughter in law and paying for her meals to be brought in She said she did not feel safe and wanted to be able to move to a facility with independent living, [facility] where she was but she cant afford that   She has been sob but without edema    Started back on her meds just recently    Date Cr K Hgb  12/19 1.18 4.5 12.5  3/20 1.24 4.6 13.7  3/21 1.26 5.5 14.0         The patient denies symptoms of fevers, chills, cough, or new SOB worrisome for COVID 19.   Past Medical History:  Diagnosis Date  . Anxiety   . Bradycardia   . Breast cancer, left breast ([facility]) 1995  . Breast cancer, right breast ([facility]) 2002  . CHF (congestive heart failure) ([facility])   .  Depression    Dr Cheryln Manly  . Discoid lupus    skin  . Full dentures   . GERD (gastroesophageal reflux disease)   . Glaucoma   . Gout   . Hx of echocardiogram    a.  Echocardiogram (03/2011): Mild LVH, EF 68-03%, grade 1 diastolic dysfunction, mild MR, mild to moderate TR, PASP 42;  b. Echo (05/2013):  Mod LVH, EF 65-70%, no WMA, Gr 1 DD, mild MR, mod TR, PASP 39  . Hx of exercise stress test    a. ETT-echocardiogram (06/08/11): Sub-optimal exercise. Test stopped early due to dizziness and hypotension. EF 60%.  Non-diagnostic.;   b. Lexiscan Myoview (06/2013):  Diaphragmatic attenuation, no ischemia, EF 61%.  Low Risk  . Hypertension   . Hypothyroidism   . IBS (irritable bowel syndrome)   . Incontinence   . OSA (obstructive sleep apnea) 07/20/2016   Mild with AHI 12/hr now on CPAP  . Osteoarthritis   . Osteopenia   . Pacemaker 2009   Brady//Chronotropic incompetence with normal pacemaker function  . Thyroid disease   . Wears hearing aid    both ears    Past Surgical History:  Procedure Laterality Date  . BREAST BIOPSY Left 1995  . BREAST LUMPECTOMY Left 1995   axillary node dissectoin  . CARDIAC CATHETERIZATION  N/A 02/13/2016   Procedure: Right Heart Cath;  Surgeon: Larey Dresser, MD;  Location: [facility];  Service: Cardiovascular;  Laterality: N/A;  . CATARACT EXTRACTION W/ INTRAOCULAR LENS  IMPLANT, BILATERAL Bilateral 06/2009 - 12.2915   left - right  . DORSAL COMPARTMENT RELEASE  2000   left  . HAMMER TOE SURGERY Left 2004  . HEMORRHOID BANDING    . INSERT / REPLACE / REMOVE PACEMAKER  2009  . KNEE ARTHROSCOPY Left 08/29/2012   Procedure: LEFT KNEE ARTHROSCOPY ;  Surgeon: Hessie Dibble, MD;  Location: [facility];  Service: Orthopedics;  Laterality: Left;  [name] MASTECTOMY Bilateral 09/2000   nbx  . PPM GENERATOR CHANGEOUT N/A 07/16/2019   Procedure: PPM GENERATOR CHANGEOUT;  Surgeon: Deboraha Sprang, MD;  Location: [facility];  Service:  Cardiovascular;  Laterality: N/A;  . TRIGGER FINGER RELEASE Right 07/31/2013   Procedure: EXCISE MASS RIGHT INDEX A-1 PLLLEY RELEASE A-1 RIGHT INDEX;  Surgeon: Cammie Sickle., MD;  Location: [facility];  Service: Orthopedics;  Laterality: Right;  [name] VAGINAL HYSTERECTOMY      Current Outpatient Medications  Medication Sig Dispense Refill  . apixaban (ELIQUIS) 5 MG TABS tablet Take 1 tablet (5 mg total) by mouth 2 (two) times daily. Annual appt is due must see provider for future refills 10 tablet 1  . cetirizine (ZYRTEC) 10 MG tablet Take 1 tablet (10 mg total) by mouth daily. 100 tablet 3  . clotrimazole-betamethasone (LOTRISONE) cream Apply 1 application topically in the morning and at bedtime.     [name] escitalopram (LEXAPRO) 10 MG tablet Take 1 tablet (10 mg total) by mouth at bedtime. 90 tablet 1  . famotidine (PEPCID) 10 MG tablet Take 10 mg by mouth 2 (two) times daily.     [name] LORazepam (ATIVAN) 0.5 MG tablet Take 1 tablet (0.5 mg total) by mouth daily as needed for anxiety. 90 tablet 0  . magnesium oxide (MAG-OX) 400 MG tablet Take 0.5 tablets (200 mg total) by mouth daily. Needs appt for further refills 45 tablet 0  . REFRESH TEARS 0.5 % SOLN Place 1 drop into both eyes daily as needed (dry eyes).   0  . SYNTHROID 100 MCG tablet Take 1 tablet (100 mcg total) by mouth daily. 90 tablet 1  . tolterodine (DETROL LA) 2 MG 24 hr capsule Take 1 capsule (2 mg total) by mouth daily. 90 capsule 3  . cholecalciferol (VITAMIN D3) 25 MCG (1000 UNIT) tablet Take 1,000 Units by mouth daily. (Patient not taking: Reported on 10/09/2019)    . spironolactone (ALDACTONE) 25 MG tablet Take 1 tablet (25 mg total) by mouth daily. (Patient not taking: Reported on 10/09/2019) 90 tablet 1   No current facility-administered medications for this visit.    Allergies:   Lactase, Amlodipine besy-benazepril hcl, Amlodipine besylate, Clonidine hydrochloride, Lactose intolerance (gi), Valsartan, and  Verapamil   Social History:  The patient  reports that she has never smoked. She has never used smokeless tobacco. She reports that she does not drink alcohol and does not use drugs.   Family History:  The patient's   family history includes Cancer in her brother; Heart disease in her brother and father; Ovarian cancer in her mother; Stroke in her brother and sister.   ROS:  Please see the history of present illness.   All other systems are personally reviewed and negative.    Exam:    Vital Signs:  BP 130/86   Pulse 96   Wt 141 lb (64 kg)   BMI 24.20 kg/m     Labs/Other Tests and Data Reviewed:    Recent Labs: 07/25/2019: ALT 26; BUN 36; Creatinine, Ser 1.23; Hemoglobin 14.0; Platelets 165; Potassium 5.5; Sodium 136   Wt Readings from Last 3 Encounters:  11/01/19 141 lb (64 kg)  10/09/19 141 lb (64 kg)  07/25/19 154 lb (69.9 kg)     Other studies personally reviewed:    Last device remote is reviewed from [facility] PDF dated 6/21 which reveals normal device function,   arrhythmias - none    ASSESSMENT & PLAN:   Sinus node dysfunction   Pacemaker Med[employer]onic @ ERI  Hypertension  Dyspnea-multifactorial  Depression  Orthostatic lightheadedness  DVT PE  Hyperkalemia>> aldactone stopped not rechecked   Her dyspnea compliants could have multiple explanations incl recurrent PE off her anticoagulation, CHF, thyroid  She is now back on her meds and we need to see how she does  It doesn't sound like she Is volume overloaded   COVID 19 screen The patient denies symptoms of COVID 19 at this time.  The importance of social distancing was discussed today.  Follow-up: 6 m Next remote: As Scheduled   Current medicines are reviewed at length with the patient today.   The patient does not have concerns regarding her medicines.  The following changes were made today:  none  Labs/ tests ordered today include:  BMET No orders of the defined types were placed in this  encounter.   Future tests ( post COVID )     Patient Risk:  after full review of this patients clinical status, I feel that they are at moderate risk at this time.  Today, I have spent 10* minutes with the patient with telehealth technology discussing the above.  I then with her permission called and spoke to Kennyth Lose B MSW regarding her social concerns  Signed, Virl Axe, MD  11/01/2019 6:12 PM     [place] [address] [phone] (office) [phone] (fax)

## 2019-11-02 ENCOUNTER — Telehealth: Payer: Self-pay | Admitting: Licensed Clinical Social Worker

## 2019-11-02 ENCOUNTER — Telehealth: Payer: Self-pay

## 2019-11-02 NOTE — Telephone Encounter (Signed)
CSW referred by MD after patient had shared some concerns about her care needs at home. CSW contacted patient who stated "I don't need a Education officer, museum". CSW attempted to explain possible assistance but patient denied need. CSW available if needed. Raquel Sarna, [place], [place]

## 2019-11-02 NOTE — Addendum Note (Signed)
Addended by: Thora Lance on: 11/02/2019 11:14 AM   Modules accepted: Orders

## 2019-11-02 NOTE — Patient Instructions (Signed)
Medication Instructions:  Your physician recommends that you continue on your current medications as directed. Please refer to the Current Medication list given to you today.  Pt verified she is not taking Spironolactone  Labwork: BMET at any [facility] in your area next week.  Testing/Procedures: None ordered.  Follow-Up: Your physician wants you to follow-up in: 6 months with Dr Caryl Comes. You will receive a reminder letter in the mail two months in advance. If you don't receive a letter, please call our office to schedule the follow-up appointment.  Remote monitoring is used to monitor your Pacemaker of ICD from home. This monitoring reduces the number of office visits required to check your device to one time per year. It allows [place] to keep an eye on the functioning of your device to ensure it is working properly.  Any Other Special Instructions Will Be Listed Below (If Applicable).  If you need a refill on your cardiac medications before your next appointment, please call your pharmacy.

## 2019-11-02 NOTE — Telephone Encounter (Signed)
Attempted phone call to pt.  OK to leave detailed message on home phone per DPR, Pt advised per visit with Dr Caryl Comes 11/01/2019 pt will need to have lab work next week and may go to any [facility] facility in her area to have labs drawn.  Order has been placed and released.  Pt may call [phone] with any further questions.

## 2019-11-02 NOTE — Telephone Encounter (Signed)
   Pt is returning call, she said she felt like she doesn't need to get any lab work done

## 2019-11-02 NOTE — Telephone Encounter (Addendum)
Spoke with pt and advised Dr Caryl Comes would like for her to have lab work completed.  Pt states she is not having any labs done anywhere even by her PCP.  Advised pt that is her choice.  Pt confirms again she is not taking Spironolactone at this time due to her B/P being too low.  Pt has f/u scheduled with her PCP 12/2019.

## 2019-11-05 ENCOUNTER — Ambulatory Visit (INDEPENDENT_AMBULATORY_CARE_PROVIDER_SITE_OTHER): Payer: Medicare Other | Admitting: Psychology

## 2019-11-05 DIAGNOSIS — F332 Major depressive disorder, recurrent severe without psychotic features: Secondary | ICD-10-CM

## 2019-11-19 ENCOUNTER — Ambulatory Visit (INDEPENDENT_AMBULATORY_CARE_PROVIDER_SITE_OTHER): Payer: Medicare Other | Admitting: Psychology

## 2019-11-19 DIAGNOSIS — F331 Major depressive disorder, recurrent, moderate: Secondary | ICD-10-CM | POA: Diagnosis not present

## 2019-12-03 ENCOUNTER — Ambulatory Visit (INDEPENDENT_AMBULATORY_CARE_PROVIDER_SITE_OTHER): Payer: Medicare Other | Admitting: Psychology

## 2019-12-03 DIAGNOSIS — F332 Major depressive disorder, recurrent severe without psychotic features: Secondary | ICD-10-CM

## 2019-12-12 ENCOUNTER — Telehealth (INDEPENDENT_AMBULATORY_CARE_PROVIDER_SITE_OTHER): Payer: Medicare Other | Admitting: Psychiatry

## 2019-12-12 ENCOUNTER — Encounter (HOSPITAL_COMMUNITY): Payer: Self-pay | Admitting: Psychiatry

## 2019-12-12 ENCOUNTER — Other Ambulatory Visit: Payer: Self-pay

## 2019-12-12 DIAGNOSIS — F330 Major depressive disorder, recurrent, mild: Secondary | ICD-10-CM | POA: Diagnosis not present

## 2019-12-12 DIAGNOSIS — F411 Generalized anxiety disorder: Secondary | ICD-10-CM

## 2019-12-12 MED ORDER — ESCITALOPRAM OXALATE 10 MG PO TABS: 10.0000 mg | ORAL_TABLET | Freq: Every day | ORAL | 0 refills | Status: DC

## 2019-12-12 MED ORDER — LORAZEPAM 0.5 MG PO TABS: 0.5000 mg | ORAL_TABLET | Freq: Every day | ORAL | 0 refills | Status: DC

## 2019-12-12 NOTE — Progress Notes (Signed)
Virtual Visit via Telephone Note  I connected with [name] on 12/12/19 at  2:40 PM EDT by telephone and verified that I am speaking with the correct person using two identifiers.  Location: Patient: home Provider: home office   I discussed the limitations, risks, security and privacy concerns of performing an evaluation and management service by telephone and the availability of in person appointments. I also discussed with the patient that there may be a patient responsible charge related to this service. The patient expressed understanding and agreed to proceed.   History of Present Illness: Patient is evaluated by phone session.  She appears anxious, nervous and sad about her living situation.  She is living with her son's ex wife but like to have her own place.  Her son is trying to find a place but so far unsuccessful.  She admitted lately forgetful.  Recently seen by her PCP Dr. Alain Marion and given lorazepam and Lexapro but she is not taking regularly as she tends to forget.  There are nights when she struggles with insomnia.  She feels trapped in the house and not happy with her current living situation.  Her son's ex-wife does not like the patient taking psych medications but she also does not opposed.  She recall when taking the medication every day she was not as depressed.  She feels overwhelmed with her health issues, lack of support and living situation.  She denies any paranoia, hallucination, suicidal thoughts.  She is in therapy with Dr. Cheryln Manly virtually.  Her appetite is fair.  Her energy level is fair.  Past Psychiatric History:Reviewed. H/Oone inpatientdue tosuicidal thinking. No h/o suicidal attempt, mania or psychosis.    Psychiatric Specialty Exam: Physical Exam  Review of Systems  Weight 141 lb (64 kg).There is no height or weight on file to calculate BMI.  General Appearance: NA  Eye Contact:  NA  Speech:  Slow  Volume:  Decreased  Mood:  Depressed and  Dysphoric  Affect:  NA  Thought Process:  Descriptions of Associations: Intact  Orientation:  Full (Time, Place, and Person)  Thought Content:  Rumination  Suicidal Thoughts:  No  Homicidal Thoughts:  No  Memory:  Immediate;   Fair Recent;   Fair Remote;   Fair  Judgement:  Fair  Insight:  Shallow  Psychomotor Activity:  NA  Concentration:  Concentration: Fair and Attention Span: Fair  Recall:  [employer] of Knowledge:  Fair  Language:  Good  Akathisia:  No  Handed:  Right  AIMS (if indicated):     Assets:  Desire for Improvement Housing  ADL's:  Intact  Cognition:  Impaired,  Mild  Sleep:   fair      Assessment and Plan: Major depressive disorder, recurrent.  Generalized anxiety disorder.  Reinforced medication compliance to get better.  Discussed risk of noncompliance with medication.  Patient agreed and promised that she will take the medicine as prescribed.  I also recommend that she should put the medicine in the pillbox and check every day to see if she is taking.  She agreed with the plan.  Continue Lexapro 10 mg daily and lorazepam 0.5 mg every day.  Encouraged to continue therapy with Dr. Cheryln Manly.  Follow-up in 3 months.  Recommended to call us back if she has any question or any concern.  Follow Up Instructions:    I discussed the assessment and treatment plan with the patient. The patient was provided an opportunity to ask questions and  all were answered. The patient agreed with the plan and demonstrated an understanding of the instructions.   The patient was advised to call back or seek an in-person evaluation if the symptoms worsen or if the condition fails to improve as anticipated.  I provided 20 minutes of non-face-to-face time during this encounter.   Kathlee Nations, MD

## 2019-12-13 ENCOUNTER — Other Ambulatory Visit: Payer: Self-pay

## 2019-12-13 NOTE — Telephone Encounter (Signed)
1.Medication Requested  :SYNTHROID 100 MCG tablet  tolterodine (DETROL LA) 2 MG 24 hr capsule  2. Pharmacy (Name, Street, [place], [place] - [address] AT [place]  3. On Med List: Yes   4. Last Visit with PCP: 6.15.21   5. Next visit date with PCP: 9.15.21    Agent: Please be advised that RX refills may take up to 3 business days. We ask that you follow-up with your pharmacy.

## 2019-12-17 MED ORDER — SYNTHROID 100 MCG PO TABS: 100.0000 ug | ORAL_TABLET | Freq: Every day | ORAL | 1 refills | Status: DC

## 2019-12-17 MED ORDER — TOLTERODINE TARTRATE ER 2 MG PO CP24: 2.0000 mg | ORAL_CAPSULE | Freq: Every day | ORAL | 3 refills | Status: DC

## 2019-12-17 NOTE — Telephone Encounter (Signed)
Pt states [facility] longer [facility] & requested pharmacy change.  Meds refilled to requested pharmacy.

## 2020-01-09 ENCOUNTER — Telehealth: Payer: Self-pay | Admitting: Internal Medicine

## 2020-01-09 ENCOUNTER — Ambulatory Visit: Payer: Medicare Other | Admitting: Internal Medicine

## 2020-01-09 DIAGNOSIS — Z0289 Encounter for other administrative examinations: Secondary | ICD-10-CM

## 2020-01-09 NOTE — Telephone Encounter (Signed)
Per pdr spoke to patients son Bland Span who states his mother no longer uses her cpap machine but is using natural remedies for her osa.

## 2020-01-09 NOTE — Telephone Encounter (Signed)
Called pharmacy, pt still has refills. Pharmacy is filling Rx

## 2020-01-09 NOTE — Telephone Encounter (Signed)
   Patient requesting refill for tolterodine (DETROL LA) 2 MG 24 hr capsule Pharmacy:[id number] - [place],  - [address] AT [facility] [address]

## 2020-01-10 ENCOUNTER — Telehealth: Payer: Self-pay | Admitting: Internal Medicine

## 2020-01-10 NOTE — Telephone Encounter (Signed)
I received a FL2 packet from [facility]. Anderson Malta also called to follow up if we received.   Patient is due for an OV. Had an appointment for 9/15 but No showed.  Called patient but NO VM is has been set up.   Anderson Malta aware and if speaks with patient or family will inform patient needs to set up an appointment.

## 2020-01-15 ENCOUNTER — Ambulatory Visit (INDEPENDENT_AMBULATORY_CARE_PROVIDER_SITE_OTHER): Payer: Medicare Other | Admitting: *Deleted

## 2020-01-15 DIAGNOSIS — I495 Sick sinus syndrome: Secondary | ICD-10-CM | POA: Diagnosis not present

## 2020-01-15 LAB — CUP PACEART REMOTE DEVICE CHECK
Battery Remaining Longevity: 146 mo
Battery Voltage: 3.18 V
Brady Statistic AP VP Percent: 24.12 %
Brady Statistic AP VS Percent: 64.36 %
Brady Statistic AS VP Percent: 2.8 %
Brady Statistic AS VS Percent: 8.72 %
Brady Statistic RA Percent Paced: 89.01 %
Brady Statistic RV Percent Paced: 26.92 %
Date Time Interrogation Session: 20210920210942
Implantable Lead Implant Date: 20090527
Implantable Lead Implant Date: 20090527
Implantable Lead Location: 753859
Implantable Lead Location: 753860
Implantable Lead Model: 5076
Implantable Lead Model: 5076
Implantable Pulse Generator Implant Date: 20210322
Lead Channel Impedance Value: 323 Ohm
Lead Channel Impedance Value: 323 Ohm
Lead Channel Impedance Value: 399 Ohm
Lead Channel Impedance Value: 456 Ohm
Lead Channel Pacing Threshold Amplitude: 0.625 V
Lead Channel Pacing Threshold Amplitude: 0.75 V
Lead Channel Pacing Threshold Pulse Width: 0.4 ms
Lead Channel Pacing Threshold Pulse Width: 0.4 ms
Lead Channel Sensing Intrinsic Amplitude: 1.625 mV
Lead Channel Sensing Intrinsic Amplitude: 1.625 mV
Lead Channel Sensing Intrinsic Amplitude: 3.125 mV
Lead Channel Sensing Intrinsic Amplitude: 3.125 mV
Lead Channel Setting Pacing Amplitude: 1.5 V
Lead Channel Setting Pacing Amplitude: 2.5 V
Lead Channel Setting Pacing Pulse Width: 0.4 ms
Lead Channel Setting Sensing Sensitivity: 0.9 mV

## 2020-01-17 NOTE — Progress Notes (Signed)
Remote pacemaker transmission.   

## 2020-01-17 NOTE — Telephone Encounter (Signed)
Patient has an appointment for 01/22/20.

## 2020-01-22 ENCOUNTER — Ambulatory Visit: Payer: Medicare Other | Admitting: Internal Medicine

## 2020-01-22 DIAGNOSIS — Z0289 Encounter for other administrative examinations: Secondary | ICD-10-CM

## 2020-01-28 ENCOUNTER — Ambulatory Visit (INDEPENDENT_AMBULATORY_CARE_PROVIDER_SITE_OTHER): Payer: Medicare Other | Admitting: Psychology

## 2020-01-28 DIAGNOSIS — F332 Major depressive disorder, recurrent severe without psychotic features: Secondary | ICD-10-CM

## 2020-01-28 NOTE — Telephone Encounter (Signed)
Patient no showed the appointment. Daughter has been informed patient needs and appointment and tb test for forms to be completed.   No appointment has been made.

## 2020-02-01 NOTE — Telephone Encounter (Signed)
Forms have been faxed back to inform patient has not completed appointment.  I will hold on to forms for 30 days.

## 2020-02-25 ENCOUNTER — Ambulatory Visit (INDEPENDENT_AMBULATORY_CARE_PROVIDER_SITE_OTHER): Payer: Medicare Other | Admitting: Psychology

## 2020-02-25 DIAGNOSIS — F332 Major depressive disorder, recurrent severe without psychotic features: Secondary | ICD-10-CM

## 2020-03-13 ENCOUNTER — Encounter (HOSPITAL_COMMUNITY): Payer: Self-pay | Admitting: Psychiatry

## 2020-03-13 ENCOUNTER — Telehealth (INDEPENDENT_AMBULATORY_CARE_PROVIDER_SITE_OTHER): Payer: Medicare Other | Admitting: Psychiatry

## 2020-03-13 ENCOUNTER — Other Ambulatory Visit: Payer: Self-pay

## 2020-03-13 DIAGNOSIS — F330 Major depressive disorder, recurrent, mild: Secondary | ICD-10-CM

## 2020-03-13 DIAGNOSIS — F411 Generalized anxiety disorder: Secondary | ICD-10-CM

## 2020-03-13 MED ORDER — LORAZEPAM 0.5 MG PO TABS: 0.5000 mg | ORAL_TABLET | Freq: Every day | ORAL | 0 refills | Status: DC

## 2020-03-13 MED ORDER — ESCITALOPRAM OXALATE 10 MG PO TABS: 10.0000 mg | ORAL_TABLET | Freq: Every day | ORAL | 0 refills | Status: DC

## 2020-03-13 NOTE — Progress Notes (Addendum)
Virtual Visit via Telephone Note  I connected with [name] on 03/13/20 at  2:40 PM EST by telephone and verified that I am speaking with the correct person using two identifiers.  Location: Patient: Home Provider: Home Office   I discussed the limitations, risks, security and privacy concerns of performing an evaluation and management service by telephone and the availability of in person appointments. I also discussed with the patient that there may be a patient responsible charge related to this service. The patient expressed understanding and agreed to proceed.   History of Present Illness: Patient is evaluated by phone session.  She is by herself on the phone.  She is very anxious and nervous because she is almost out of her Ativan and thyroid medication.  She is not happy with her living situation.  She is trying to move to assisted living facility but so far she has been unsuccessful.  She has not seen Dr. Alain Marion and needed all her refills.  She endorsed lack of appetite but not sure how much she lost weight because she does not have a scale.  She is living with her son ex-wife place and sometimes her son comes and visit her.  She denies any suicidal thoughts or homicidal thought.  She also endorsed not consistent with her therapy with Dr. Cheryln Manly because of her living situation.  She is hoping that once she had a living situation addressed then she will consistent with her treatment.  She denies any suicidal thoughts or homicidal thoughts.  She has no side effects from the medication.  She like to resume her medication.   Past Psychiatric History:Reviewed. H/Oone inpatientdue tosuicidal thinking. No h/o suicidal attempt, mania or psychosis.    Psychiatric Specialty Exam: Physical Exam  Review of Systems  There were no vitals taken for this visit.There is no height or weight on file to calculate BMI.  General Appearance: NA  Eye Contact:  NA  Speech:  Slow  Volume:   Decreased  Mood:  Dysphoric  Affect:  NA  Thought Process:  Goal Directed  Orientation:  Full (Time, Place, and Person)  Thought Content:  Rumination  Suicidal Thoughts:  No  Homicidal Thoughts:  No  Memory:  Immediate;   Fair Recent;   Fair Remote;   Fair  Judgement:  Fair  Insight:  Shallow  Psychomotor Activity:  NA  Concentration:  Concentration: Fair and Attention Span: Fair  Recall:  Good  Fund of Knowledge:  Good  Language:  Good  Akathisia:  No  Handed:  Right  AIMS (if indicated):     Assets:  Communication Skills Desire for Improvement Housing  ADL's:  Intact  Cognition:  Impaired,  Mild  Sleep:   fair      Assessment and Plan: Major depressive disorder, recurrent.  Generalized anxiety disorder.  Discussed medication compliance and side effects.  Patient has no concerns from the medication.  She wants to give a message to her PCP so she can get her refills of thyroid and other medication.  We will do that.  I will forward my note to her physician Dr. Alain Marion.  Continue lorazepam 0.5 mg daily and Lexapro 10 mg daily.  I also encouraged that she should consider resume therapy with Dr. Cheryln Manly.  Recommended to call us back if she is any question or any concern.  Follow-up in 3 months.  Follow Up Instructions:    I discussed the assessment and treatment plan with the patient. The patient was  provided an opportunity to ask questions and all were answered. The patient agreed with the plan and demonstrated an understanding of the instructions.   The patient was advised to call back or seek an in-person evaluation if the symptoms worsen or if the condition fails to improve as anticipated.  I provided 15 minutes of non-face-to-face time during this encounter.   Kathlee Nations, MD

## 2020-03-16 ENCOUNTER — Other Ambulatory Visit: Payer: Self-pay | Admitting: Internal Medicine

## 2020-03-16 MED ORDER — SYNTHROID 100 MCG PO TABS: 100.0000 ug | ORAL_TABLET | Freq: Every day | ORAL | 3 refills | Status: DC

## 2020-03-24 ENCOUNTER — Other Ambulatory Visit: Payer: Self-pay | Admitting: Internal Medicine

## 2020-03-24 ENCOUNTER — Ambulatory Visit (INDEPENDENT_AMBULATORY_CARE_PROVIDER_SITE_OTHER): Payer: Medicare Other | Admitting: Psychology

## 2020-03-24 DIAGNOSIS — F332 Major depressive disorder, recurrent severe without psychotic features: Secondary | ICD-10-CM

## 2020-03-25 ENCOUNTER — Ambulatory Visit: Payer: Medicare Other | Admitting: Internal Medicine

## 2020-03-25 DIAGNOSIS — Z0289 Encounter for other administrative examinations: Secondary | ICD-10-CM

## 2020-04-15 ENCOUNTER — Ambulatory Visit (INDEPENDENT_AMBULATORY_CARE_PROVIDER_SITE_OTHER): Payer: Medicare Other

## 2020-04-15 DIAGNOSIS — I495 Sick sinus syndrome: Secondary | ICD-10-CM

## 2020-04-15 LAB — CUP PACEART REMOTE DEVICE CHECK
Battery Remaining Longevity: 141 mo
Battery Voltage: 3.13 V
Brady Statistic AP VP Percent: 26.76 %
Brady Statistic AP VS Percent: 52.83 %
Brady Statistic AS VP Percent: 5.72 %
Brady Statistic AS VS Percent: 14.7 %
Brady Statistic RA Percent Paced: 80.3 %
Brady Statistic RV Percent Paced: 32.47 %
Date Time Interrogation Session: 20211220182333
Implantable Lead Implant Date: 20090527
Implantable Lead Implant Date: 20090527
Implantable Lead Location: 753859
Implantable Lead Location: 753860
Implantable Lead Model: 5076
Implantable Lead Model: 5076
Implantable Pulse Generator Implant Date: 20210322
Lead Channel Impedance Value: 342 Ohm
Lead Channel Impedance Value: 361 Ohm
Lead Channel Impedance Value: 418 Ohm
Lead Channel Impedance Value: 456 Ohm
Lead Channel Pacing Threshold Amplitude: 0.625 V
Lead Channel Pacing Threshold Amplitude: 0.875 V
Lead Channel Pacing Threshold Pulse Width: 0.4 ms
Lead Channel Pacing Threshold Pulse Width: 0.4 ms
Lead Channel Sensing Intrinsic Amplitude: 2.5 mV
Lead Channel Sensing Intrinsic Amplitude: 2.5 mV
Lead Channel Sensing Intrinsic Amplitude: 3.5 mV
Lead Channel Sensing Intrinsic Amplitude: 3.5 mV
Lead Channel Setting Pacing Amplitude: 1.75 V
Lead Channel Setting Pacing Amplitude: 2.5 V
Lead Channel Setting Pacing Pulse Width: 0.4 ms
Lead Channel Setting Sensing Sensitivity: 0.9 mV

## 2020-04-21 ENCOUNTER — Ambulatory Visit (INDEPENDENT_AMBULATORY_CARE_PROVIDER_SITE_OTHER): Payer: Medicare Other | Admitting: Psychology

## 2020-04-21 DIAGNOSIS — F332 Major depressive disorder, recurrent severe without psychotic features: Secondary | ICD-10-CM | POA: Diagnosis not present

## 2020-04-21 DIAGNOSIS — Z23 Encounter for immunization: Secondary | ICD-10-CM | POA: Diagnosis not present

## 2020-04-29 ENCOUNTER — Telehealth: Payer: Self-pay | Admitting: *Deleted

## 2020-04-29 NOTE — Telephone Encounter (Signed)
Rec'd fax stating Santana Gosdin Synthroid 0.1 mg tablets ar not covered by pt plan. The preferred alternative is Levothyroxine sodium, Levot, or Levoxyl. Requesting to change to one of the preferred drug.Marland KitchenRaechel Chute

## 2020-04-29 NOTE — Progress Notes (Signed)
Remote pacemaker transmission.   

## 2020-04-30 NOTE — Telephone Encounter (Signed)
Called pt there was no answer LMOM RTC...lmb 

## 2020-04-30 NOTE — Telephone Encounter (Signed)
Please ask [name] what she wants to do.  She is very particular about her meds.  Thanks

## 2020-05-01 NOTE — Telephone Encounter (Signed)
Called pt again still no answer LMOM RTC.../lmb 

## 2020-05-07 ENCOUNTER — Telehealth (HOSPITAL_COMMUNITY): Payer: Self-pay | Admitting: *Deleted

## 2020-05-07 DIAGNOSIS — F411 Generalized anxiety disorder: Secondary | ICD-10-CM

## 2020-05-07 DIAGNOSIS — F330 Major depressive disorder, recurrent, mild: Secondary | ICD-10-CM

## 2020-05-07 NOTE — Telephone Encounter (Signed)
FYI pt called stating that she has been in a facility, unnamed, for the past month and has not been receiving her Lexapro and consequently is feeling very anxious and almost agitated. Pt also stated that she will be moved to a higher level of care on this coming Monday 05/12/20, again unamed. Writer returned call and left a VM for pt stating that she has refills on all meds scripted by dr. Adele Schilder and would she please call back and provide names of facilities so as nurse may f/u. Writer also attempted to contact pt daughter patti but VM box has not been set up.

## 2020-05-08 ENCOUNTER — Other Ambulatory Visit (HOSPITAL_COMMUNITY): Payer: Self-pay | Admitting: *Deleted

## 2020-05-08 DIAGNOSIS — F4489 Other dissociative and conversion disorders: Secondary | ICD-10-CM | POA: Diagnosis not present

## 2020-05-08 DIAGNOSIS — F411 Generalized anxiety disorder: Secondary | ICD-10-CM

## 2020-05-08 DIAGNOSIS — R2689 Other abnormalities of gait and mobility: Secondary | ICD-10-CM | POA: Diagnosis not present

## 2020-05-08 DIAGNOSIS — G4733 Obstructive sleep apnea (adult) (pediatric): Secondary | ICD-10-CM | POA: Diagnosis not present

## 2020-05-08 DIAGNOSIS — E039 Hypothyroidism, unspecified: Secondary | ICD-10-CM | POA: Diagnosis not present

## 2020-05-08 DIAGNOSIS — R2681 Unsteadiness on feet: Secondary | ICD-10-CM | POA: Diagnosis not present

## 2020-05-08 DIAGNOSIS — F5109 Other insomnia not due to a substance or known physiological condition: Secondary | ICD-10-CM | POA: Diagnosis not present

## 2020-05-08 DIAGNOSIS — R630 Anorexia: Secondary | ICD-10-CM | POA: Diagnosis not present

## 2020-05-08 DIAGNOSIS — R634 Abnormal weight loss: Secondary | ICD-10-CM | POA: Diagnosis not present

## 2020-05-08 DIAGNOSIS — Z9114 Patient's other noncompliance with medication regimen: Secondary | ICD-10-CM | POA: Diagnosis not present

## 2020-05-08 DIAGNOSIS — F329 Major depressive disorder, single episode, unspecified: Secondary | ICD-10-CM | POA: Diagnosis not present

## 2020-05-08 DIAGNOSIS — R531 Weakness: Secondary | ICD-10-CM | POA: Diagnosis not present

## 2020-05-08 DIAGNOSIS — F330 Major depressive disorder, recurrent, mild: Secondary | ICD-10-CM

## 2020-05-08 DIAGNOSIS — M6281 Muscle weakness (generalized): Secondary | ICD-10-CM | POA: Diagnosis not present

## 2020-05-08 DIAGNOSIS — R001 Bradycardia, unspecified: Secondary | ICD-10-CM | POA: Diagnosis not present

## 2020-05-08 DIAGNOSIS — R4584 Anhedonia: Secondary | ICD-10-CM | POA: Diagnosis not present

## 2020-05-08 MED ORDER — ESCITALOPRAM OXALATE 10 MG PO TABS: 10.0000 mg | ORAL_TABLET | Freq: Every day | ORAL | 0 refills | Status: DC

## 2020-05-08 NOTE — Telephone Encounter (Signed)
I also tried to call her and her daughter with no luck.  We can provide a 30-day supply to her pharmacy and let her know on her voicemail.  I see she has an upcoming appointment in February and I hope that she keep that appointment to provide more details unless we do not receive a call back from her or her daughter.

## 2020-05-09 ENCOUNTER — Encounter: Payer: Self-pay | Admitting: Emergency Medicine

## 2020-05-09 ENCOUNTER — Other Ambulatory Visit: Payer: Self-pay

## 2020-05-09 ENCOUNTER — Telehealth: Payer: Self-pay | Admitting: Internal Medicine

## 2020-05-09 ENCOUNTER — Emergency Department
Admission: EM | Admit: 2020-05-09 | Discharge: 2020-05-09 | Disposition: A | Discharge status: Other Acute Inpatient Hospital | Payer: Medicare Other | Attending: Emergency Medicine | Admitting: Emergency Medicine

## 2020-05-09 ENCOUNTER — Inpatient Hospital Stay
Admission: RE | Admit: 2020-05-09 | Discharge: 2020-05-14 | DRG: 885 | Disposition: A | Discharge status: Home Health | Payer: Medicare Other | Source: Intra-hospital | Attending: Behavioral Health | Admitting: Behavioral Health

## 2020-05-09 DIAGNOSIS — L930 Discoid lupus erythematosus: Secondary | ICD-10-CM | POA: Diagnosis present

## 2020-05-09 DIAGNOSIS — F332 Major depressive disorder, recurrent severe without psychotic features: Secondary | ICD-10-CM | POA: Diagnosis present

## 2020-05-09 DIAGNOSIS — E739 Lactose intolerance, unspecified: Secondary | ICD-10-CM | POA: Diagnosis present

## 2020-05-09 DIAGNOSIS — M109 Gout, unspecified: Secondary | ICD-10-CM | POA: Diagnosis present

## 2020-05-09 DIAGNOSIS — G4733 Obstructive sleep apnea (adult) (pediatric): Secondary | ICD-10-CM | POA: Diagnosis present

## 2020-05-09 DIAGNOSIS — R279 Unspecified lack of coordination: Secondary | ICD-10-CM | POA: Diagnosis not present

## 2020-05-09 DIAGNOSIS — Z950 Presence of cardiac pacemaker: Secondary | ICD-10-CM

## 2020-05-09 DIAGNOSIS — Z974 Presence of external hearing-aid: Secondary | ICD-10-CM | POA: Diagnosis not present

## 2020-05-09 DIAGNOSIS — M1990 Unspecified osteoarthritis, unspecified site: Secondary | ICD-10-CM | POA: Diagnosis present

## 2020-05-09 DIAGNOSIS — Z79899 Other long term (current) drug therapy: Secondary | ICD-10-CM | POA: Insufficient documentation

## 2020-05-09 DIAGNOSIS — F331 Major depressive disorder, recurrent, moderate: Secondary | ICD-10-CM | POA: Diagnosis not present

## 2020-05-09 DIAGNOSIS — R45851 Suicidal ideations: Secondary | ICD-10-CM | POA: Insufficient documentation

## 2020-05-09 DIAGNOSIS — F411 Generalized anxiety disorder: Secondary | ICD-10-CM

## 2020-05-09 DIAGNOSIS — I2510 Atherosclerotic heart disease of native coronary artery without angina pectoris: Secondary | ICD-10-CM | POA: Diagnosis not present

## 2020-05-09 DIAGNOSIS — F419 Anxiety disorder, unspecified: Secondary | ICD-10-CM | POA: Diagnosis present

## 2020-05-09 DIAGNOSIS — I130 Hypertensive heart and chronic kidney disease with heart failure and stage 1 through stage 4 chronic kidney disease, or unspecified chronic kidney disease: Secondary | ICD-10-CM | POA: Diagnosis not present

## 2020-05-09 DIAGNOSIS — I509 Heart failure, unspecified: Secondary | ICD-10-CM | POA: Diagnosis not present

## 2020-05-09 DIAGNOSIS — Z853 Personal history of malignant neoplasm of breast: Secondary | ICD-10-CM | POA: Diagnosis not present

## 2020-05-09 DIAGNOSIS — Z7901 Long term (current) use of anticoagulants: Secondary | ICD-10-CM | POA: Diagnosis not present

## 2020-05-09 DIAGNOSIS — E039 Hypothyroidism, unspecified: Secondary | ICD-10-CM | POA: Insufficient documentation

## 2020-05-09 DIAGNOSIS — F29 Unspecified psychosis not due to a substance or known physiological condition: Secondary | ICD-10-CM | POA: Diagnosis not present

## 2020-05-09 DIAGNOSIS — H409 Unspecified glaucoma: Secondary | ICD-10-CM | POA: Diagnosis present

## 2020-05-09 DIAGNOSIS — G4730 Sleep apnea, unspecified: Secondary | ICD-10-CM | POA: Diagnosis not present

## 2020-05-09 DIAGNOSIS — I119 Hypertensive heart disease without heart failure: Secondary | ICD-10-CM | POA: Diagnosis present

## 2020-05-09 DIAGNOSIS — Z888 Allergy status to other drugs, medicaments and biological substances status: Secondary | ICD-10-CM | POA: Diagnosis not present

## 2020-05-09 DIAGNOSIS — F429 Obsessive-compulsive disorder, unspecified: Secondary | ICD-10-CM | POA: Diagnosis present

## 2020-05-09 DIAGNOSIS — M8580 Other specified disorders of bone density and structure, unspecified site: Secondary | ICD-10-CM | POA: Diagnosis present

## 2020-05-09 DIAGNOSIS — Z743 Need for continuous supervision: Secondary | ICD-10-CM | POA: Diagnosis not present

## 2020-05-09 DIAGNOSIS — K219 Gastro-esophageal reflux disease without esophagitis: Secondary | ICD-10-CM | POA: Diagnosis present

## 2020-05-09 DIAGNOSIS — Z9013 Acquired absence of bilateral breasts and nipples: Secondary | ICD-10-CM

## 2020-05-09 DIAGNOSIS — Z79890 Hormone replacement therapy (postmenopausal): Secondary | ICD-10-CM | POA: Diagnosis not present

## 2020-05-09 DIAGNOSIS — Z8041 Family history of malignant neoplasm of ovary: Secondary | ICD-10-CM

## 2020-05-09 DIAGNOSIS — Z8249 Family history of ischemic heart disease and other diseases of the circulatory system: Secondary | ICD-10-CM

## 2020-05-09 DIAGNOSIS — F32A Depression, unspecified: Secondary | ICD-10-CM | POA: Diagnosis not present

## 2020-05-09 DIAGNOSIS — Z823 Family history of stroke: Secondary | ICD-10-CM | POA: Diagnosis not present

## 2020-05-09 DIAGNOSIS — F330 Major depressive disorder, recurrent, mild: Secondary | ICD-10-CM

## 2020-05-09 DIAGNOSIS — F329 Major depressive disorder, single episode, unspecified: Secondary | ICD-10-CM | POA: Diagnosis present

## 2020-05-09 DIAGNOSIS — N183 Chronic kidney disease, stage 3 unspecified: Secondary | ICD-10-CM | POA: Diagnosis not present

## 2020-05-09 DIAGNOSIS — Z20822 Contact with and (suspected) exposure to covid-19: Secondary | ICD-10-CM | POA: Diagnosis not present

## 2020-05-09 DIAGNOSIS — K580 Irritable bowel syndrome with diarrhea: Secondary | ICD-10-CM | POA: Diagnosis present

## 2020-05-09 DIAGNOSIS — I10 Essential (primary) hypertension: Secondary | ICD-10-CM | POA: Diagnosis present

## 2020-05-09 DIAGNOSIS — R4182 Altered mental status, unspecified: Secondary | ICD-10-CM | POA: Diagnosis not present

## 2020-05-09 LAB — COMPREHENSIVE METABOLIC PANEL
ALT: 13 U/L (ref 0–44)
AST: 21 U/L (ref 15–41)
Albumin: 3.9 g/dL (ref 3.5–5.0)
Alkaline Phosphatase: 51 U/L (ref 38–126)
Anion gap: 14 (ref 5–15)
BUN: 31 mg/dL — ABNORMAL HIGH (ref 8–23)
CO2: 26 mmol/L (ref 22–32)
Calcium: 9.8 mg/dL (ref 8.9–10.3)
Chloride: 105 mmol/L (ref 98–111)
Creatinine, Ser: 1.16 mg/dL — ABNORMAL HIGH (ref 0.44–1.00)
GFR, Estimated: 45 mL/min — ABNORMAL LOW (ref >60)
Glucose, Bld: 85 mg/dL (ref 70–99)
Potassium: 3.8 mmol/L (ref 3.5–5.1)
Sodium: 145 mmol/L (ref 135–145)
Total Bilirubin: 1.6 mg/dL — ABNORMAL HIGH (ref 0.3–1.2)
Total Protein: 7.3 g/dL (ref 6.5–8.1)

## 2020-05-09 LAB — CBC
HCT: 38.4 % (ref 36.0–46.0)
Hemoglobin: 12.8 g/dL (ref 12.0–15.0)
MCH: 33.2 pg (ref 26.0–34.0)
MCHC: 33.3 g/dL (ref 30.0–36.0)
MCV: 99.5 fL (ref 80.0–100.0)
Platelets: 162 10*3/uL (ref 150–400)
RBC: 3.86 MIL/uL — ABNORMAL LOW (ref 3.87–5.11)
RDW: 14.1 % (ref 11.5–15.5)
WBC: 2.8 10*3/uL — ABNORMAL LOW (ref 4.0–10.5)
nRBC: 0 % (ref 0.0–0.2)

## 2020-05-09 LAB — ETHANOL: Alcohol, Ethyl (B): <10 mg/dL (ref <10)

## 2020-05-09 LAB — ACETAMINOPHEN LEVEL: Acetaminophen (Tylenol), Serum: <10 ug/mL — ABNORMAL LOW (ref 10–30)

## 2020-05-09 LAB — RESP PANEL BY RT-PCR (FLU A&B, COVID) ARPGX2
Influenza A by PCR: NEGATIVE
Influenza B by PCR: NEGATIVE
SARS Coronavirus 2 by RT PCR: NEGATIVE

## 2020-05-09 LAB — TSH: TSH: 2.39 u[IU]/mL (ref 0.350–4.500)

## 2020-05-09 LAB — SALICYLATE LEVEL: Salicylate Lvl: <7 mg/dL — ABNORMAL LOW (ref 7.0–30.0)

## 2020-05-09 MED ORDER — LEVOTHYROXINE SODIUM 100 MCG PO TABS
100.0000 ug | ORAL_TABLET | Freq: Every day | ORAL | Status: DC
  Administered 2020-05-10 – 2020-05-14 (×5): 100 ug via ORAL
  Filled 2020-05-09 (×5): qty 1

## 2020-05-09 MED ORDER — ACETAMINOPHEN 325 MG PO TABS: 650.0000 mg | ORAL_TABLET | Freq: Four times a day (QID) | ORAL | Status: DC | PRN

## 2020-05-09 MED ORDER — TRAZODONE HCL 100 MG PO TABS
100.0000 mg | ORAL_TABLET | Freq: Every evening | ORAL | Status: DC | PRN
  Administered 2020-05-10 – 2020-05-13 (×4): 100 mg via ORAL
  Filled 2020-05-09 (×4): qty 1

## 2020-05-09 MED ORDER — SPIRONOLACTONE 25 MG PO TABS
25.0000 mg | ORAL_TABLET | Freq: Every day | ORAL | Status: DC
  Administered 2020-05-10 – 2020-05-14 (×5): 25 mg via ORAL
  Filled 2020-05-09 (×6): qty 1

## 2020-05-09 MED ORDER — SPIRONOLACTONE 25 MG PO TABS
25.0000 mg | ORAL_TABLET | Freq: Every day | ORAL | Status: DC
  Filled 2020-05-09 (×2): qty 1

## 2020-05-09 MED ORDER — ALUM & MAG HYDROXIDE-SIMETH 200-200-20 MG/5ML PO SUSP
30.0000 mL | ORAL | Status: DC | PRN
  Administered 2020-05-14: 30 mL via ORAL
  Filled 2020-05-09: qty 30

## 2020-05-09 MED ORDER — LORAZEPAM 0.5 MG PO TABS
0.5000 mg | ORAL_TABLET | Freq: Every day | ORAL | Status: DC
  Administered 2020-05-10 – 2020-05-14 (×5): 0.5 mg via ORAL
  Filled 2020-05-09 (×5): qty 1

## 2020-05-09 MED ORDER — LORATADINE 10 MG PO TABS
10.0000 mg | ORAL_TABLET | Freq: Every day | ORAL | Status: DC
  Administered 2020-05-10 – 2020-05-14 (×5): 10 mg via ORAL
  Filled 2020-05-09 (×5): qty 1

## 2020-05-09 MED ORDER — ESCITALOPRAM OXALATE 10 MG PO TABS
10.0000 mg | ORAL_TABLET | Freq: Every day | ORAL | Status: DC
  Administered 2020-05-10 – 2020-05-12 (×3): 10 mg via ORAL
  Filled 2020-05-09 (×3): qty 1

## 2020-05-09 MED ORDER — MAGNESIUM HYDROXIDE 400 MG/5ML PO SUSP: 30.0000 mL | Freq: Every day | ORAL | Status: DC | PRN

## 2020-05-09 MED ORDER — ESCITALOPRAM OXALATE 10 MG PO TABS
10.0000 mg | ORAL_TABLET | Freq: Every day | ORAL | Status: DC
  Administered 2020-05-09: 10 mg via ORAL
  Filled 2020-05-09: qty 1

## 2020-05-09 MED ORDER — LORAZEPAM 0.5 MG PO TABS
0.5000 mg | ORAL_TABLET | Freq: Every day | ORAL | Status: DC
  Administered 2020-05-09: 0.5 mg via ORAL
  Filled 2020-05-09: qty 1

## 2020-05-09 MED ORDER — FAMOTIDINE 20 MG PO TABS
10.0000 mg | ORAL_TABLET | Freq: Two times a day (BID) | ORAL | Status: DC
  Administered 2020-05-10 – 2020-05-14 (×9): 10 mg via ORAL
  Filled 2020-05-09 (×9): qty 1

## 2020-05-09 MED ORDER — POLYVINYL ALCOHOL 1.4 % OP SOLN
1.0000 [drp] | OPHTHALMIC | Status: DC | PRN
  Filled 2020-05-09: qty 15

## 2020-05-09 MED ORDER — LORAZEPAM 0.5 MG PO TABS
0.5000 mg | ORAL_TABLET | Freq: Once | ORAL | Status: AC
  Administered 2020-05-09: 0.5 mg via ORAL
  Filled 2020-05-09: qty 1

## 2020-05-09 MED ORDER — TRAZODONE HCL 100 MG PO TABS: 100.0000 mg | ORAL_TABLET | Freq: Every evening | ORAL | Status: DC | PRN

## 2020-05-09 MED ORDER — LORATADINE 10 MG PO TABS
10.0000 mg | ORAL_TABLET | Freq: Every day | ORAL | Status: DC
  Administered 2020-05-09: 10 mg via ORAL
  Filled 2020-05-09: qty 1

## 2020-05-09 MED ORDER — LEVOTHYROXINE SODIUM 50 MCG PO TABS: 100.0000 ug | ORAL_TABLET | Freq: Every day | ORAL | Status: DC

## 2020-05-09 MED ORDER — FAMOTIDINE 20 MG PO TABS
10.0000 mg | ORAL_TABLET | Freq: Two times a day (BID) | ORAL | Status: DC
Start: 2020-05-09 — End: 2020-05-09
  Administered 2020-05-09 (×2): 10 mg via ORAL
  Filled 2020-05-09 (×3): qty 1

## 2020-05-09 MED ORDER — APIXABAN 5 MG PO TABS
5.0000 mg | ORAL_TABLET | Freq: Two times a day (BID) | ORAL | Status: DC
  Administered 2020-05-10: 5 mg via ORAL
  Filled 2020-05-09 (×2): qty 1

## 2020-05-09 MED ORDER — APIXABAN 5 MG PO TABS
5.0000 mg | ORAL_TABLET | Freq: Two times a day (BID) | ORAL | Status: DC
  Administered 2020-05-09: 5 mg via ORAL
  Filled 2020-05-09: qty 1

## 2020-05-09 NOTE — BH Assessment (Signed)
PATIENT BED AVAILABLE AT 10PM  Patient is to be admitted to [facility] BMU by Dr. Weber Cooks.  Attending Physician will be Dr. Selina Cooley.   Patient has been assigned to room 301, by [place] Nurse.   Intake Paper Work has been signed and placed on patient chart.  ER staff is aware of the admission:  [facility] ER Secretary    Dr. Ellender Hose, ER MD   Ena Dawley Patient's Nurse   Patient has already been pre-admitted

## 2020-05-09 NOTE — ED Notes (Signed)
BMU called for pt coming down, 2 bags and cane, eyeglasses, and paperwork

## 2020-05-09 NOTE — Telephone Encounter (Signed)
[facility] FYI  Caller states she has been here for some time. She needs med for sleep and if she does not get it, she will kill herself. She needs to go somewhere where she can feel safe. She is in independent living. She has been there for 2 months. She says she needs to leave. They have their own Dr that gets her the medicine. She wants to leave there today. She went to mental health a long time ago and they got her some help. She just feels at the end of her rope for a long time. She thought they were going to get some bloodwork and help her but they told her they cannot do that today and she will need to call 911. She wants to go to a mental hospital. She is not sleeping. She is having pain from not sleeping. she is having pain in her chest and heart. Feels like it is going to fall out. Has a new pacemaker in March. She is not eating . She is very weak. Once she ran out of lorazapam, she felt so bad and they will not get her more.   [facility] advised: Call EMS 911 now  Patient did go to the ED on 1.14.2022

## 2020-05-09 NOTE — Consult Note (Signed)
[facility] Consult   Reason for Consult: Consult for 87 year old woman brought over voluntarily from [facility] for anxiety and depression Referring Physician: Corky Downs Patient Identification: [name] MRN:  [id number] Principal Diagnosis: Moderate recurrent major depression ([place]) Diagnosis:  Principal Problem:   Moderate recurrent major depression ([facility]) Active Problems:   Sleep apnea   Generalized anxiety disorder   Total Time spent with patient: 1 hour  Subjective:   [name] is a 87 y.o. female patient admitted with "it is so bad over there, they do not give me my medicine".  HPI: Patient seen and chart reviewed.  87 year old woman who suffers from chronic anxiety and depression.  She has been living at [facility] assisted living facility since the last week of December.  She tells me that during that time they have not restarted her on her usual prescription medicines for reasons that seem unclear.  She also complains that she normally uses a CPAP machine for her sleep apnea and that that has not been provided to her either.  She is feeling nervous and jittery all the time.  Mood feels dysphoric and hopeless.  She admits that she made some suicidal statements today.  Denies having any plan to hurt herself but frequently makes remarks about how she thinks that she might as well die as to live at [facility].  She says she has been sleeping poorly for the last month and has not eaten well.  She believes she has lost weight.  Patient is alert and oriented.  Does not appear to be delirious or confused.  Denies hallucinations.  Denies any substance abuse.  Past Psychiatric History: Patient reports she has had chronic anxiety and depression which is confirmed by referral to her notes.  Dr. Dossie Der is her usual provider in [place] I believe.  She had been on 0.5 mg of lorazepam and 10 mg of Lexapro chronically.  Patient denies having ever tried to kill herself.  Says that she  has had one prior psychiatric hospital in [place] years ago for the same symptoms.  Risk to Self:   Risk to Others:   Prior Inpatient Therapy:   Prior Outpatient Therapy:    Past Medical History:  Past Medical History:  Diagnosis Date  . Anxiety   . Bradycardia   . Breast cancer, left breast ([place]) 1995  . Breast cancer, right breast ([place]) 2002  . CHF (congestive heart failure) ([place])   . Depression    Dr Cheryln Manly  . Discoid lupus    skin  . Full dentures   . GERD (gastroesophageal reflux disease)   . Glaucoma   . Gout   . Hx of echocardiogram    a.  Echocardiogram (03/2011): Mild LVH, EF 40-98%, grade 1 diastolic dysfunction, mild MR, mild to moderate TR, PASP 42;  b. Echo (05/2013):  Mod LVH, EF 65-70%, no WMA, Gr 1 DD, mild MR, mod TR, PASP 39  . Hx of exercise stress test    a. ETT-echocardiogram (06/08/11): Sub-optimal exercise. Test stopped early due to dizziness and hypotension. EF 60%.  Non-diagnostic.;   b. Lexiscan Myoview (06/2013):  Diaphragmatic attenuation, no ischemia, EF 61%.  Low Risk  . Hypertension   . Hypothyroidism   . IBS (irritable bowel syndrome)   . Incontinence   . OSA (obstructive sleep apnea) 07/20/2016   Mild with AHI 12/hr now on CPAP  . Osteoarthritis   . Osteopenia   . Pacemaker 2009   Brady//Chronotropic incompetence with normal  pacemaker function  . Thyroid disease   . Wears hearing aid    both ears    Past Surgical History:  Procedure Laterality Date  . BREAST BIOPSY Left 1995  . BREAST LUMPECTOMY Left 1995   axillary node dissectoin  . CARDIAC CATHETERIZATION N/A 02/13/2016   Procedure: Right Heart Cath;  Surgeon: Larey Dresser, MD;  Location: [facility];  Service: Cardiovascular;  Laterality: N/A;  . CATARACT EXTRACTION W/ INTRAOCULAR LENS  IMPLANT, BILATERAL Bilateral 06/2009 - 12.2915   left - right  . DORSAL COMPARTMENT RELEASE  2000   left  . HAMMER TOE SURGERY Left 2004  . HEMORRHOID BANDING    . INSERT / REPLACE /  REMOVE PACEMAKER  2009  . KNEE ARTHROSCOPY Left 08/29/2012   Procedure: LEFT KNEE ARTHROSCOPY ;  Surgeon: Hessie Dibble, MD;  Location: [facility];  Service: Orthopedics;  Laterality: Left;  [name] MASTECTOMY Bilateral 09/2000   nbx  . PPM GENERATOR CHANGEOUT N/A 07/16/2019   Procedure: PPM GENERATOR CHANGEOUT;  Surgeon: Deboraha Sprang, MD;  Location: [facility];  Service: Cardiovascular;  Laterality: N/A;  . TRIGGER FINGER RELEASE Right 07/31/2013   Procedure: EXCISE MASS RIGHT INDEX A-1 PLLLEY RELEASE A-1 RIGHT INDEX;  Surgeon: Cammie Sickle., MD;  Location: [facility];  Service: Orthopedics;  Laterality: Right;  [name] VAGINAL HYSTERECTOMY     Family History:  Family History  Problem Relation Age of Onset  . Ovarian cancer Mother   . Heart disease Father   . Stroke Sister   . Stroke Brother   . Cancer Brother   . Heart disease Brother    Family Psychiatric  History: Denies any Social History:  Social History   Substance and Sexual Activity  Alcohol Use No  . Alcohol/week: 0.0 standard drinks     Social History   Substance and Sexual Activity  Drug Use No    Social History   Socioeconomic History  . Marital status: Widowed    Spouse name: Not on file  . Number of children: 3  . Years of education: 33  . Highest education level: Not on file  Occupational History  . Occupation: retired  Tobacco Use  . Smoking status: Never Smoker  . Smokeless tobacco: Never Used  Vaping Use  . Vaping Use: Never used  Substance and Sexual Activity  . Alcohol use: No    Alcohol/week: 0.0 standard drinks  . Drug use: No  . Sexual activity: Not Currently  Other Topics Concern  . Not on file  Social History Narrative   She is a vegan now 2010.     Lives at [facility].     Retired from Orthoptist.    Education: college.     3 children.    Social Determinants of Health   Financial Resource Strain: Not on file  Food Insecurity: Not on  file  Transportation Needs: Not on file  Physical Activity: Not on file  Stress: Not on file  Social Connections: Not on file   Additional Social History:    Allergies:   Allergies  Allergen Reactions  . Lactase Diarrhea  . Amlodipine Besy-Benazepril Hcl Swelling  . Amlodipine Besylate Swelling  . Clonidine Hydrochloride     REACTION: tired  . Lactose Intolerance (Gi) Diarrhea and Other (See Comments)  . Valsartan Other (See Comments)    Tongue swelling   . Verapamil Other (See Comments)    unknown  Labs: No results found. However, due to the size of the patient record, not all encounters were searched. Please check Results Review for a complete set of results.  Current Facility-Administered Medications  Medication Dose Route Frequency Provider Last Rate Last Admin  . escitalopram (LEXAPRO) tablet 10 mg  10 mg Oral Daily [name], [name], MD      . LORazepam (ATIVAN) tablet 0.5 mg  0.5 mg Oral Daily [name], Madie Reno, MD       Current Outpatient Medications  Medication Sig Dispense Refill  . apixaban (ELIQUIS) 5 MG TABS tablet Take 1 tablet (5 mg total) by mouth 2 (two) times daily. Annual appt is due must see provider for future refills 10 tablet 1  . cetirizine (ZYRTEC) 10 MG tablet Take 1 tablet (10 mg total) by mouth daily. 100 tablet 3  . clotrimazole-betamethasone (LOTRISONE) cream Apply 1 application topically in the morning and at bedtime.     [name] escitalopram (LEXAPRO) 10 MG tablet Take 1 tablet (10 mg total) by mouth at bedtime. 30 tablet 0  . famotidine (PEPCID) 10 MG tablet Take 10 mg by mouth 2 (two) times daily.     [name] LORazepam (ATIVAN) 0.5 MG tablet Take 1 tablet (0.5 mg total) by mouth daily. 90 tablet 0  . magnesium oxide (MAG-OX) 400 MG tablet Take 0.5 tablets (200 mg total) by mouth daily. Needs appt for further refills 45 tablet 0  . REFRESH TEARS 0.5 % SOLN Place 1 drop into both eyes daily as needed (dry eyes).   0  . spironolactone (ALDACTONE) 25 MG tablet  Take 1 tablet (25 mg total) by mouth daily. (Patient not taking: Reported on 10/09/2019) 90 tablet 1  . SYNTHROID 100 MCG tablet Take 1 tablet (100 mcg total) by mouth daily before breakfast. Follow-up appt is due w/labs must see provider for future refills 30 tablet 0  . tolterodine (DETROL LA) 2 MG 24 hr capsule Take 1 capsule (2 mg total) by mouth daily. 90 capsule 3    Musculoskeletal: Strength & Muscle Tone: within normal limits Gait & Station: shuffle Patient leans: N/A  Psychiatric Specialty Exam: Physical Exam Vitals and nursing note reviewed.  Constitutional:      Appearance: She is well-developed and well-nourished.  HENT:     Head: Normocephalic and atraumatic.  Eyes:     Conjunctiva/sclera: Conjunctivae normal.     Pupils: Pupils are equal, round, and reactive to light.  Cardiovascular:     Heart sounds: Normal heart sounds.  Pulmonary:     Effort: Pulmonary effort is normal.  Abdominal:     Palpations: Abdomen is soft.  Musculoskeletal:        General: Normal range of motion.     Cervical back: Normal range of motion.  Skin:    General: Skin is warm and dry.  Neurological:     General: No focal deficit present.     Mental Status: She is alert.  Psychiatric:        Attention and Perception: Attention normal.        Mood and Affect: Mood is anxious and depressed.        Speech: Speech is delayed.        Behavior: Behavior normal.        Thought Content: Thought content includes suicidal ideation. Thought content does not include suicidal plan.        Cognition and Memory: Cognition normal.        Judgment: Judgment normal.  Review of Systems  Constitutional: Negative.   HENT: Negative.   Eyes: Negative.   Respiratory: Negative.   Cardiovascular: Negative.   Gastrointestinal: Negative.   Musculoskeletal: Negative.   Skin: Negative.   Neurological: Negative.   Psychiatric/Behavioral: Positive for dysphoric mood, sleep disturbance and suicidal ideas.  The patient is nervous/anxious.     Height 5\' 4"  (1.626 m), weight 64 kg.Body mass index is 24.22 kg/m.  General Appearance: Casual  Eye Contact:  Minimal  Speech:  Slow  Volume:  Decreased  Mood:  Anxious and Depressed  Affect:  Congruent  Thought Process:  Coherent  Orientation:  Full (Time, Place, and Person)  Thought Content:  Logical and Rumination  Suicidal Thoughts:  Yes.  without intent/plan  Homicidal Thoughts:  No  Memory:  Immediate;   Fair Recent;   Fair Remote;   Fair  Judgement:  Fair  Insight:  Fair  Psychomotor Activity:  Decreased  Concentration:  Concentration: Fair  Recall:  [employer] of Knowledge:  Fair  Language:  Fair  Akathisia:  No  Handed:  Right  AIMS (if indicated):     Assets:  Communication Skills Desire for Improvement Housing Resilience Social Support  ADL's:  Intact  Cognition:  WNL  Sleep:        Treatment Plan Summary: Daily contact with patient to assess and evaluate symptoms and progress in treatment, Medication management and Plan 85-year-old woman with chronic anxiety and depression presents with worsening of her baseline symptoms also weight loss not eating well not sleeping well.  Has been off medicine recently.  Patient is alert and oriented and coherent and agreeable to a plan for inpatient treatment which she feels would be safer.  I have spoken with the inpatient team on our unit who will tentatively agree to admit the patient here pending laboratory studies.  Meanwhile I am restarting her baseline medication.  Patient expresses agreeability to the plan and fact she is asking to be in the hospital saying she would feel safer that way.  Disposition: Recommend psychiatric Inpatient admission when medically cleared. Supportive therapy provided about ongoing stressors.  Alethia Berthold, MD 05/09/2020 12:03 PM

## 2020-05-09 NOTE — Telephone Encounter (Signed)
° °  Patient calling to report she is currently at [facility] She states she is not getting her medications Advised patient Lexapro was called to pharmacy 01/13 by behavioral health Patient states she has no one to get the medication to her and staff at facility "doesn't do what they say"  Patient states she will "kill herself if she doesn't get help today" Patient goes on to say" she wants to stop living if she has to stay in this situation"  Call transferred to [facility]

## 2020-05-09 NOTE — ED Notes (Signed)
Pt upset she has to stay in the hospital.  Pt stated that since she has taken her medication she is ready go to back home.  Pt admits she should not have made the comment about harming herself.

## 2020-05-09 NOTE — ED Notes (Signed)
This tech assisted pt to change into bh scrubs.  Pt belongings includes: Copywriter, advertising, olive pants, black tennis shoes, leopard print coat, black purse, and purple iphone. Pt is keeping her pair of red frame glasses and cane with her at this moment.   Pt's contact #: MICHAEL CAINS ([place]) [phone] PETTI ([place]) [phone]

## 2020-05-09 NOTE — ED Notes (Signed)
Pt asking for her Lexapro.  Pt believes the pill given earlier was fake.  RN explained she was given real Lexapro and that it is only ordered once a day.  Pt insists she needs another dose to help her rest.

## 2020-05-09 NOTE — BH Assessment (Signed)
Assessment Note   [name] is an 87 y.o. female. She reports, "I have not been getting my medicine at the facility.  They tell me every night, but it never comes.  I can't get no sleep.  I have no one here.  I am from [place]. My kids left me there. My kids are just fussing and don't come and visit me. If I need to go out into the street and get hit, I am alright with that.  I am not suicidal, I just want to get my medication. I am just not comfortable, because I can't get my medication. My kids don't come over to help me.  I was trying to get a caretaker, and the facility would not help me. I have to do everything myself.  My kids have their own homes, but they don't want me.  I have not has my medicine and my sleep apnea machine".  [name] denied symptoms of anxiety.  She denied having auditory or visual hallucinations.  She denied the use of alcohol or drugs.    TTS spoke with [name] at [facility]. She reports, "She called 911 or someone she was talking to called and reported her to 911.  She mentioned causing harm to herself or killing herself.  She has been complaining about her medication and CPAP machine. She was alluding to someone needed to get there right away.  She has been unable to sleep.  She doesn't accept that it takes time for these things to arrive".   Patient is being seen by Larey Seat / Doctors making housecalls - [phone]    Axis III:  Past Medical History:  Diagnosis Date  . Anxiety   . Bradycardia   . Breast cancer, left breast ([place]) 1995  . Breast cancer, right breast ([place]) 2002  . CHF (congestive heart failure) ([place])   . Depression    Dr Cheryln Manly  . Discoid lupus    skin  . Full dentures   . GERD (gastroesophageal reflux disease)   . Glaucoma   . Gout   . Hx of echocardiogram    a.  Echocardiogram (03/2011): Mild LVH, EF 78-29%, grade 1 diastolic dysfunction, mild MR, mild to moderate TR, PASP 42;  b. Echo (05/2013):  Mod LVH,  EF 65-70%, no WMA, Gr 1 DD, mild MR, mod TR, PASP 39  . Hx of exercise stress test    a. ETT-echocardiogram (06/08/11): Sub-optimal exercise. Test stopped early due to dizziness and hypotension. EF 60%.  Non-diagnostic.;   b. Lexiscan Myoview (06/2013):  Diaphragmatic attenuation, no ischemia, EF 61%.  Low Risk  . Hypertension   . Hypothyroidism   . IBS (irritable bowel syndrome)   . Incontinence   . OSA (obstructive sleep apnea) 07/20/2016   Mild with AHI 12/hr now on CPAP  . Osteoarthritis   . Osteopenia   . Pacemaker 2009   Brady//Chronotropic incompetence with normal pacemaker function  . Thyroid disease   . Wears hearing aid    both ears     Past Medical History:  Past Medical History:  Diagnosis Date  . Anxiety   . Bradycardia   . Breast cancer, left breast ([place]) 1995  . Breast cancer, right breast ([place]) 2002  . CHF (congestive heart failure) ([place])   . Depression    Dr Cheryln Manly  . Discoid lupus    skin  . Full dentures   . GERD (gastroesophageal reflux disease)   . Glaucoma   .  Gout   . Hx of echocardiogram    a.  Echocardiogram (03/2011): Mild LVH, EF 0000000, grade 1 diastolic dysfunction, mild MR, mild to moderate TR, PASP 42;  b. Echo (05/2013):  Mod LVH, EF 65-70%, no WMA, Gr 1 DD, mild MR, mod TR, PASP 39  . Hx of exercise stress test    a. ETT-echocardiogram (06/08/11): Sub-optimal exercise. Test stopped early due to dizziness and hypotension. EF 60%.  Non-diagnostic.;   b. Lexiscan Myoview (06/2013):  Diaphragmatic attenuation, no ischemia, EF 61%.  Low Risk  . Hypertension   . Hypothyroidism   . IBS (irritable bowel syndrome)   . Incontinence   . OSA (obstructive sleep apnea) 07/20/2016   Mild with AHI 12/hr now on CPAP  . Osteoarthritis   . Osteopenia   . Pacemaker 2009   Brady//Chronotropic incompetence with normal pacemaker function  . Thyroid disease   . Wears hearing aid    both ears    Past Surgical History:  Procedure Laterality Date  . BREAST  BIOPSY Left 1995  . BREAST LUMPECTOMY Left 1995   axillary node dissectoin  . CARDIAC CATHETERIZATION N/A 02/13/2016   Procedure: Right Heart Cath;  Surgeon: Larey Dresser, MD;  Location: [facility];  Service: Cardiovascular;  Laterality: N/A;  . CATARACT EXTRACTION W/ INTRAOCULAR LENS  IMPLANT, BILATERAL Bilateral 06/2009 - 12.2915   left - right  . DORSAL COMPARTMENT RELEASE  2000   left  . HAMMER TOE SURGERY Left 2004  . HEMORRHOID BANDING    . INSERT / REPLACE / REMOVE PACEMAKER  2009  . KNEE ARTHROSCOPY Left 08/29/2012   Procedure: LEFT KNEE ARTHROSCOPY ;  Surgeon: Hessie Dibble, MD;  Location: [facility];  Service: Orthopedics;  Laterality: Left;  [name] MASTECTOMY Bilateral 09/2000   nbx  . PPM GENERATOR CHANGEOUT N/A 07/16/2019   Procedure: PPM GENERATOR CHANGEOUT;  Surgeon: Deboraha Sprang, MD;  Location: [facility];  Service: Cardiovascular;  Laterality: N/A;  . TRIGGER FINGER RELEASE Right 07/31/2013   Procedure: EXCISE MASS RIGHT INDEX A-1 PLLLEY RELEASE A-1 RIGHT INDEX;  Surgeon: Cammie Sickle., MD;  Location: [facility];  Service: Orthopedics;  Laterality: Right;  [name] VAGINAL HYSTERECTOMY      Family History:  Family History  Problem Relation Age of Onset  . Ovarian cancer Mother   . Heart disease Father   . Stroke Sister   . Stroke Brother   . Cancer Brother   . Heart disease Brother     Social History:  reports that she has never smoked. She has never used smokeless tobacco. She reports that she does not drink alcohol and does not use drugs.  Allergies:  Allergies  Allergen Reactions  . Lactase Diarrhea  . Amlodipine Besy-Benazepril Hcl Swelling  . Amlodipine Besylate Swelling  . Clonidine Hydrochloride     REACTION: tired  . Lactose Intolerance (Gi) Diarrhea and Other (See Comments)  . Valsartan Other (See Comments)    Tongue swelling   . Verapamil Other (See Comments)    unknown    Home Medications: (Not  in a hospital admission)   OB/GYN Status:  No LMP recorded. Patient has had a hysterectomy.  General Assessment Data Marital status: Widowed                  Regulatory affairs officer (For Healthcare) Does Patient Have a Medical Advance Directive?: No Would patient like information on creating a medical advance directive?:  No - Patient declined          Disposition:     On Site Evaluation by:   Reviewed with Physician:     Elmer Bales MS, LCAS, LPCA Assessment Counselor 05/09/2020 2:17 PM Comprehensive Clinical Assessment (CCA) Note  05/09/2020 [name] [id number]  Chief Complaint:  Chief Complaint  Patient presents with  . Suicidal   Visit Diagnosis: Major Depressive Disorder    CCA Screening, Triage and Referral (STR)  Patient Reported Information How did you hear about [place]? Primary Care  Referral name: EMS  Referral phone number: No data recorded  Whom do you see for routine medical problems? No data recorded Practice/Facility Name: No data recorded Practice/Facility Phone Number: No data recorded Name of Contact: No data recorded Contact Number: No data recorded Contact Fax Number: No data recorded Prescriber Name: No data recorded Prescriber Address (if known): No data recorded  What Is the Reason for Your Visit/Call Today? No data recorded How Long Has This Been Causing You Problems? No data recorded What Do You Feel Would Help You the Most Today? No data recorded  Have You Recently Been in Any Inpatient Treatment (Hospital/Detox/Crisis Center/28-Day Program)? No data recorded Name/Location of Program/Hospital:No data recorded How Long Were You There? No data recorded When Were You Discharged? No data recorded  Have You Ever Received Services From [facility] Before? No data recorded Who Do You See at [facility]? No data recorded  Have You Recently Had Any Thoughts About Hurting Yourself? No data recorded Are You Planning to Commit  Suicide/Harm Yourself At This time? No data recorded  Have you Recently Had Thoughts About [place]? No data recorded Explanation: No data recorded  Have You Used Any Alcohol or Drugs in the Past 24 Hours? No data recorded How Long Ago Did You Use Drugs or Alcohol? No data recorded What Did You Use and How Much? No data recorded  Do You Currently Have a Therapist/Psychiatrist? No data recorded Name of Therapist/Psychiatrist: No data recorded  Have You Been Recently Discharged From Any Office Practice or Programs? No data recorded Explanation of Discharge From Practice/Program: No data recorded    CCA Screening Triage Referral Assessment Type of Contact: No data recorded Is this Initial or Reassessment? No data recorded Date Telepsych consult ordered in CHL:  No data recorded Time Telepsych consult ordered in CHL:  No data recorded  Patient Reported Information Reviewed? No data recorded Patient Left Without Being Seen? No data recorded Reason for Not Completing Assessment: No data recorded  Collateral Involvement: No data recorded  Does Patient Have a [place]? No data recorded Name and Contact of Legal Guardian: No data recorded If Minor and Not Living with Parent(s), Who has Custody? No data recorded Is CPS involved or ever been involved? No data recorded Is APS involved or ever been involved? No data recorded  Patient Determined To Be At Risk for Harm To Self or Others Based on Review of Patient Reported Information or Presenting Complaint? No data recorded Method: No data recorded Availability of Means: No data recorded Intent: No data recorded Notification Required: No data recorded Additional Information for Danger to Others Potential: No data recorded Additional Comments for Danger to Others Potential: No data recorded Are There Guns or Other Weapons in Your Home? No data recorded Types of Guns/Weapons: No data recorded Are These  Weapons Safely Secured?  No data recorded Who Could Verify You Are Able To Have These Secured: No data recorded Do You Have any Outstanding Charges, Pending Court Dates, Parole/Probation? No data recorded Contacted To Inform of Risk of Harm To Self or Others: No data recorded  Location of Assessment: No data recorded  Does Patient Present under Involuntary Commitment? No data recorded IVC Papers Initial File Date: No data recorded  [place] of Residence: No data recorded  Patient Currently Receiving the Following Services: No data recorded  Determination of Need: No data recorded  Options For Referral: No data recorded    CCA Biopsychosocial Intake/Chief Complaint:  "I can't just do it"  Current Symptoms/Problems: Depressed   Patient Reported Schizophrenia/Schizoaffective Diagnosis in Past: No data recorded  Strengths: No data recorded Preferences: No data recorded Abilities: No data recorded  Type of Services Patient Feels are Needed: No data recorded  Initial Clinical Notes/Concerns: No data recorded  Mental Health Symptoms Depression:  Fatigue; Sleep (too much or little); Irritability   Duration of Depressive symptoms: No data recorded  Mania:  No data recorded  Anxiety:   No data recorded  Psychosis:  None   Duration of Psychotic symptoms: No data recorded  Trauma:  None   Obsessions:  None   Compulsions:  None   Inattention:  None   Hyperactivity/Impulsivity:  No data recorded  Oppositional/Defiant Behaviors:  No data recorded  Emotional Irregularity:  No data recorded  Other Mood/Personality Symptoms:  No data recorded   Mental Status Exam Appearance and self-care  Stature:  Average   Weight:  Thin   Clothing:  Age-appropriate   Grooming:  Normal   Cosmetic use:  None   Posture/gait:  No data recorded  Motor activity:  Not Remarkable   Sensorium  Attention:  Normal   Concentration:  Normal   Orientation:  X5    Recall/memory:  Normal   Affect and Mood  Affect:  Anxious   Mood:  Pessimistic   Relating  Eye contact:  Normal   Facial expression:  Tense   Attitude toward examiner:  Cooperative   Thought and Language  Speech flow: Clear and Coherent   Thought content:  Appropriate to Mood and Circumstances   Preoccupation:  No data recorded  Hallucinations:  None   Organization:  No data recorded  [employer] of Knowledge:  Average   Intelligence:  No data recorded  Abstraction:  No data recorded  Judgement:  No data recorded  Reality Testing:  No data recorded  Insight:  No data recorded  Decision Making:  No data recorded  Social Functioning  Social Maturity:  Responsible   Social Judgement:  No data recorded  Stress  Stressors:  No data recorded  Coping Ability:  No data recorded  Skill Deficits:  No data recorded  Supports:  No data recorded    Religion:    Leisure/Recreation:    Exercise/Diet:     CCA Employment/Education Employment/Work Situation: Employment / Work Situation Employment situation: Retired  Scientist, physiological: Education Is Patient Currently Attending School?: No Did Teacher, adult education From [employer]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   CCA Family/Childhood History Family and Relationship History: Family history Marital status: Widowed  Childhood History:     Child/Adolescent Assessment:     CCA Substance Use Alcohol/Drug Use: Alcohol / Drug Use History of alcohol / drug use?: No history of alcohol / drug abuse       ASAM's:  Six Dimensions of Multidimensional Assessment  Dimension 1:  Acute Intoxication  and/or Withdrawal Potential:      Dimension 2:  Biomedical Conditions and Complications:      Dimension 3:  Emotional, Behavioral, or Cognitive Conditions and Complications:     Dimension 4:  Readiness to Change:     Dimension 5:  Relapse, Continued use, or Continued Problem Potential:     Dimension 6:  Recovery/Living Environment:     ASAM  Severity Score:    ASAM Recommended Level of Treatment:     Substance use Disorder (SUD)    Recommendations for Services/Supports/Treatments:    DSM5 Diagnoses: Patient Active Problem List   Diagnosis Date Noted  . Moderate recurrent major depression ([place]) 05/09/2020  . Generalized anxiety disorder 05/09/2020  . Weight loss 10/09/2019  . DVT (deep venous thrombosis) ([place]) 03/29/2018  . PE (pulmonary thromboembolism) ([place]) 02/03/2018  . Macrocytosis 12/21/2017  . Wart 12/21/2017  . Sleep apnea 07/20/2016  . Sigmoid diverticulitis 07/01/2016  . Acute diverticulitis   . Rectal bleeding 06/30/2016  . Impacted cerumen of right ear 03/26/2016  . Syncope 03/22/2016  . Benign essential HTN 03/22/2016  . CAD (coronary artery disease) 03/22/2016  . CKD (chronic kidney disease) stage 3, GFR 30-59 ml/min (HCC) 03/22/2016  . Depression 03/22/2016  . Exertional dyspnea 11/04/2015  . Ascending aortic aneurysm ([place]) 11/04/2015  . 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 Arthralgia 10/10/2015  . Pulmonary hypertension ([place]) 09/30/2015  . Mild persistent asthma 09/30/2015  . Irritable larynx 09/04/2015  . Rotator cuff disorder 03/06/2015  . OCD (obsessive compulsive disorder) 04/03/2013  . IBS (irritable bowel syndrome) 07/03/2012  . Dysphagia 04/13/2012  . Denture irritation 12/20/2011  . Irritable bladder 12/20/2011  . Knee pain, bilateral 12/20/2011  . Hearing loss 10/11/2011  . Pacemaker-Medtronic 05/18/2011  . DOE (dyspnea on exertion) 04/13/2011  . Gout, unspecified 04/30/2010  . Anal fissure 02/03/2010  . Atrial fibrillation ([place]) 08/26/2009  . Sinoatrial node dysfunction ([employer] [place]) 09/17/2008  . HYPOTENSION, ORTHOSTATIC 01/29/2008  . Microscopic hematuria 08/21/2007  . Pain in joint, pelvic region and thigh 08/01/2007  . Vitamin D deficiency 05/23/2007  . Anxiety disorder 05/23/2007  . Hypothyroidism 03/22/2007  . GERD 03/22/2007  . Osteoarthritis 03/22/2007  . CFS (chronic fatigue  syndrome) 11/17/2006  . Hypertensive heart disease 11/17/2006  . ALLERGIC RHINITIS 11/17/2006  . OSTEOPOROSIS 11/17/2006  . BREAST CANCER, HX OF 11/17/2006     Elmer Bales, Counselor

## 2020-05-09 NOTE — ED Notes (Signed)
Pt given multiple blankets to help keep her warm.  Pt upset she cannot have her hat.

## 2020-05-09 NOTE — ED Provider Notes (Signed)
[facility] Emergency Department Provider Note   ____________________________________________    I have reviewed the triage vital signs and the nursing notes.   HISTORY  Chief Complaint Suicidal     HPI [name] is a 87 y.o. female with a history of depression, CHF, hypothyroidism, hypertension who is a new resident at [facility] and reports that her medications have not been started and she is quite frustrated.  Patient notes that she is depressed and threatened to kill her self to the paramedics.  Patient notes she is feeling nervous and shaky, she feels hopeless, she denies a clear plan to hurt herself but does complain of depression  Past Medical History:  Diagnosis Date  . Anxiety   . Bradycardia   . Breast cancer, left breast ([place]) 1995  . Breast cancer, right breast ([place]) 2002  . CHF (congestive heart failure) ([place])   . Depression    Dr Cheryln Manly  . Discoid lupus    skin  . Full dentures   . GERD (gastroesophageal reflux disease)   . Glaucoma   . Gout   . Hx of echocardiogram    a.  Echocardiogram (03/2011): Mild LVH, EF 77-82%, grade 1 diastolic dysfunction, mild MR, mild to moderate TR, PASP 42;  b. Echo (05/2013):  Mod LVH, EF 65-70%, no WMA, Gr 1 DD, mild MR, mod TR, PASP 39  . Hx of exercise stress test    a. ETT-echocardiogram (06/08/11): Sub-optimal exercise. Test stopped early due to dizziness and hypotension. EF 60%.  Non-diagnostic.;   b. Lexiscan Myoview (06/2013):  Diaphragmatic attenuation, no ischemia, EF 61%.  Low Risk  . Hypertension   . Hypothyroidism   . IBS (irritable bowel syndrome)   . Incontinence   . OSA (obstructive sleep apnea) 07/20/2016   Mild with AHI 12/hr now on CPAP  . Osteoarthritis   . Osteopenia   . Pacemaker 2009   Brady//Chronotropic incompetence with normal pacemaker function  . Thyroid disease   . Wears hearing aid    both ears    Patient Active Problem List   Diagnosis Date Noted  .  Moderate recurrent major depression ([place]) 05/09/2020  . Generalized anxiety disorder 05/09/2020  . Weight loss 10/09/2019  . DVT (deep venous thrombosis) ([place]) 03/29/2018  . PE (pulmonary thromboembolism) ([place]) 02/03/2018  . Macrocytosis 12/21/2017  . Wart 12/21/2017  . Sleep apnea 07/20/2016  . Sigmoid diverticulitis 07/01/2016  . Acute diverticulitis   . Rectal bleeding 06/30/2016  . Impacted cerumen of right ear 03/26/2016  . Syncope 03/22/2016  . Benign essential HTN 03/22/2016  . CAD (coronary artery disease) 03/22/2016  . CKD (chronic kidney disease) stage 3, GFR 30-59 ml/min (HCC) 03/22/2016  . Depression 03/22/2016  . Exertional dyspnea 11/04/2015  . Ascending aortic aneurysm ([place]) 11/04/2015  . 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 Arthralgia 10/10/2015  . Pulmonary hypertension ([place]) 09/30/2015  . Mild persistent asthma 09/30/2015  . Irritable larynx 09/04/2015  . Rotator cuff disorder 03/06/2015  . OCD (obsessive compulsive disorder) 04/03/2013  . IBS (irritable bowel syndrome) 07/03/2012  . Dysphagia 04/13/2012  . Denture irritation 12/20/2011  . Irritable bladder 12/20/2011  . Knee pain, bilateral 12/20/2011  . Hearing loss 10/11/2011  . Pacemaker-Medtronic 05/18/2011  . DOE (dyspnea on exertion) 04/13/2011  . Gout, unspecified 04/30/2010  . Anal fissure 02/03/2010  . Atrial fibrillation ([place]) 08/26/2009  . Sinoatrial node dysfunction ([place]) 09/17/2008  . HYPOTENSION, ORTHOSTATIC 01/29/2008  . Microscopic hematuria 08/21/2007  . Pain  in joint, pelvic region and thigh 08/01/2007  . Vitamin D deficiency 05/23/2007  . Anxiety disorder 05/23/2007  . Hypothyroidism 03/22/2007  . GERD 03/22/2007  . Osteoarthritis 03/22/2007  . CFS (chronic fatigue syndrome) 11/17/2006  . Hypertensive heart disease 11/17/2006  . ALLERGIC RHINITIS 11/17/2006  . OSTEOPOROSIS 11/17/2006  . BREAST CANCER, HX OF 11/17/2006    Past Surgical History:  Procedure Laterality Date  . BREAST  BIOPSY Left 1995  . BREAST LUMPECTOMY Left 1995   axillary node dissectoin  . CARDIAC CATHETERIZATION N/A 02/13/2016   Procedure: Right Heart Cath;  Surgeon: Larey Dresser, MD;  Location: [place] [facility];  Service: Cardiovascular;  Laterality: N/A;  . CATARACT EXTRACTION W/ INTRAOCULAR LENS  IMPLANT, BILATERAL Bilateral 06/2009 - 12.2915   left - right  . DORSAL COMPARTMENT RELEASE  2000   left  . HAMMER TOE SURGERY Left 2004  . HEMORRHOID BANDING    . INSERT / REPLACE / REMOVE PACEMAKER  2009  . KNEE ARTHROSCOPY Left 08/29/2012   Procedure: LEFT KNEE ARTHROSCOPY ;  Surgeon: Hessie Dibble, MD;  Location: [place];  Service: Orthopedics;  Laterality: Left;  [name] MASTECTOMY Bilateral 09/2000   nbx  . PPM GENERATOR CHANGEOUT N/A 07/16/2019   Procedure: PPM GENERATOR CHANGEOUT;  Surgeon: Deboraha Sprang, MD;  Location: [place] [facility];  Service: Cardiovascular;  Laterality: N/A;  . TRIGGER FINGER RELEASE Right 07/31/2013   Procedure: EXCISE MASS RIGHT INDEX A-1 [place] RELEASE A-1 RIGHT INDEX;  Surgeon: Cammie Sickle., MD;  Location: [place];  Service: Orthopedics;  Laterality: Right;  [name] VAGINAL HYSTERECTOMY      Prior to Admission medications   Medication Sig Start Date End Date Taking? Authorizing Provider  apixaban (ELIQUIS) 5 MG TABS tablet Take 1 tablet (5 mg total) by mouth 2 (two) times daily. Annual appt is due must see provider for future refills 10/10/19   Plotnikov, Evie Lacks, MD  cetirizine (ZYRTEC) 10 MG tablet Take 1 tablet (10 mg total) by mouth daily. 10/09/19   Plotnikov, Evie Lacks, MD  clotrimazole-betamethasone (LOTRISONE) cream Apply 1 application topically in the morning and at bedtime.  06/27/19   [provider]  escitalopram (LEXAPRO) 10 MG tablet Take 1 tablet (10 mg total) by mouth at bedtime. 05/08/20   Arfeen, Arlyce Harman, MD  famotidine (PEPCID) 10 MG tablet Take 10 mg by mouth 2 (two) times daily.     [provider]  LORazepam (ATIVAN) 0.5 MG tablet Take 1 tablet (0.5 mg total) by mouth daily. 03/13/20   Arfeen, Arlyce Harman, MD  magnesium oxide (MAG-OX) 400 MG tablet Take 0.5 tablets (200 mg total) by mouth daily. Needs appt for further refills 09/28/19   Larey Dresser, MD  REFRESH TEARS 0.5 % SOLN Place 1 drop into both eyes daily as needed (dry eyes).  02/07/17   [provider]  spironolactone (ALDACTONE) 25 MG tablet Take 1 tablet (25 mg total) by mouth daily. Patient not taking: Reported on 10/09/2019 06/06/19   Plotnikov, Evie Lacks, MD  SYNTHROID 100 MCG tablet Take 1 tablet (100 mcg total) by mouth daily before breakfast. Follow-up appt is due w/labs must see provider for future refills 03/24/20   Plotnikov, Evie Lacks, MD  tolterodine (DETROL LA) 2 MG 24 hr capsule Take 1 capsule (2 mg total) by mouth daily. 12/17/19   Plotnikov, Evie Lacks, MD     Allergies Lactase, Amlodipine besy-benazepril hcl, Amlodipine besylate, Clonidine  hydrochloride, Lactose intolerance (gi), Valsartan, and Verapamil  Family History  Problem Relation Age of Onset  . Ovarian cancer Mother   . Heart disease Father   . Stroke Sister   . Stroke Brother   . Cancer Brother   . Heart disease Brother     Social History Social History   Tobacco Use  . Smoking status: Never Smoker  . Smokeless tobacco: Never Used  Vaping Use  . Vaping Use: Never used  Substance Use Topics  . Alcohol use: No    Alcohol/week: 0.0 standard drinks  . Drug use: No    Review of Systems  Constitutional: No fever/chills Eyes: No visual changes.  ENT: No sore throat. Cardiovascular: Denies chest pain. Respiratory: Denies shortness of breath. Gastrointestinal: No abdominal pain.   Genitourinary: Negative for dysuria. Musculoskeletal: Negative for back pain. Skin: Negative for rash. Neurological: Negative for headaches or weakness   ____________________________________________   PHYSICAL EXAM:  VITAL SIGNS: ED Triage  Vitals  Enc Vitals Group     BP 05/09/20 1214 119/72     Pulse Rate 05/09/20 1214 62     Resp 05/09/20 1214 18     Temp 05/09/20 1214 98.8 F (37.1 C)     Temp Source 05/09/20 1214 Oral     SpO2 05/09/20 1214 100 %     Weight 05/09/20 1137 64 kg (141 lb 1.5 oz)     Height 05/09/20 1137 1.626 m (5\' 4" )     Head Circumference --      Peak Flow --      Pain Score 05/09/20 1137 0     Pain Loc --      Pain Edu? --      Excl. in [place]? --     Constitutional: Alert and oriented.  Nose: No congestion/rhinnorhea. Mouth/Throat: Mucous membranes are moist.    Cardiovascular: Normal rate, regular rhythm.  Good peripheral circulation. Respiratory: Normal respiratory effort.  No retractions. Lungs CTAB. Gastrointestinal: Soft and nontender. No distention.  No CVA tenderness.  Musculoskeletal:  Warm and well perfused Neurologic:  Normal speech and language. No gross focal neurologic deficits are appreciated.  Skin:  Skin is warm, dry and intact. No rash noted. Psychiatric: Mood and affect are normal. Speech and behavior are normal.  ____________________________________________   LABS (all labs ordered are listed, but only abnormal results are displayed)  Labs Reviewed  COMPREHENSIVE METABOLIC PANEL - Abnormal; Notable for the following components:      Result Value   BUN 31 (*)    Creatinine, Ser 1.16 (*)    Total Bilirubin 1.6 (*)    GFR, Estimated 45 (*)    All other components within normal limits  SALICYLATE LEVEL - Abnormal; Notable for the following components:   Salicylate Lvl <3.0 (*)    All other components within normal limits  ACETAMINOPHEN LEVEL - Abnormal; Notable for the following components:   Acetaminophen (Tylenol), Serum <10 (*)    All other components within normal limits  CBC - Abnormal; Notable for the following components:   WBC 2.8 (*)    RBC 3.86 (*)    All other components within normal limits  RESP PANEL BY RT-PCR (FLU A&B, COVID) ARPGX2  ETHANOL  TSH   URINE DRUG SCREEN, QUALITATIVE ([facility] ONLY)   ____________________________________________  EKG  None ____________________________________________  RADIOLOGY  None ____________________________________________   PROCEDURES  Procedure(s) performed: No  Procedures   Critical Care performed: No ____________________________________________   INITIAL IMPRESSION / ASSESSMENT AND  PLAN / ED COURSE  Pertinent labs & imaging results that were available during my care of the patient were reviewed by me and considered in my medical decision making (see chart for details).  Patient presents with depression, SI.  Labs and physical exam are overall reassuring, patient is medically cleared.  Seen by Dr. Weber Cooks of psychiatry who recommends possible [facility] psych admission versus admission here  The patient has been placed in psychiatric observation due to the need to provide a safe environment for the patient while obtaining psychiatric consultation and evaluation, as well as ongoing medical and medication management to treat the patient's condition.  The patient has not been placed under full IVC at this time.       ____________________________________________   FINAL CLINICAL IMPRESSION(S) / ED DIAGNOSES  Final diagnoses:  None        Note:  This document was prepared using Dragon voice recognition software and may include unintentional dictation errors.   Lavonia Drafts, MD 05/09/20 1322

## 2020-05-09 NOTE — ED Triage Notes (Signed)
Pt to ER via EMS form [facility].  Pt is new resident there and her medications (sleep, thyroid, BP) have not been restarted.  Pt's Cpap was also lost in transit and pt is not sleeping well.  EMS responded to residence and was attempting to talk pt into staying there and waiting for things to stabilize when the pt said "Fine. I'll just stay here and kill myself".  Dr. Weber Cooks was on unit and stopped to talk with pt on her arrival

## 2020-05-09 NOTE — ED Notes (Signed)
Pt ambulated to the bathroom with her cane.

## 2020-05-09 NOTE — ED Notes (Signed)
Pt will be transferred to BMU after shift change.

## 2020-05-10 ENCOUNTER — Other Ambulatory Visit: Payer: Self-pay

## 2020-05-10 ENCOUNTER — Encounter: Payer: Self-pay | Admitting: Psychiatry

## 2020-05-10 DIAGNOSIS — F332 Major depressive disorder, recurrent severe without psychotic features: Secondary | ICD-10-CM | POA: Diagnosis not present

## 2020-05-10 MED ORDER — INFLUENZA VAC A&B SA ADJ QUAD 0.5 ML IM PRSY
0.5000 mL | PREFILLED_SYRINGE | INTRAMUSCULAR | Status: DC
  Filled 2020-05-10: qty 0.5

## 2020-05-10 MED ORDER — PNEUMOCOCCAL VAC POLYVALENT 25 MCG/0.5ML IJ INJ
0.5000 mL | INJECTION | INTRAMUSCULAR | Status: DC
  Filled 2020-05-10: qty 0.5

## 2020-05-10 NOTE — H&P (Signed)
Psychiatric Admission Assessment Adult  Patient Identification: [name] MRN:  [id number] Date of Evaluation:  05/10/2020 Chief Complaint:  Moderately severe recurrent major depression ([place]) [F33.2] Principal Diagnosis: <principal problem not specified> Diagnosis:  Active Problems:   Moderately severe recurrent major depression ([place])  History of Present Illness:  HPI: Patient reports he was diagnosed with depression, anxiety many years ago. She reports that her depression and anxiety had worsened significantly for the last three weeks till the point she started feeling suicidal. She reportedly has been living at [facility] assisted living facility since the last week of December and states she was not getting her usual prescription medicines there for unclear reasons. She reports she had trouble sleeping and was not eating well for last month. She reports that without her usual psych medications, her mood deteriorated progressively till the moment she started feeling hopeless and suicidal which brought her to the hospital. Her medications (Lexapro and Ativan) were restarted yesterday, and she reports feeling "so much calmer" already and denies feeling suicidal anymore.  Patient is alert and oriented.  Does not appear to be confused.  Denies homicidal thoughts. Denies any substance abuse. Patient denies any current or past hallucinations, illusions. Patient does not express any delusions. Patient reports feeling safe here in the hospital. She is concerned about her future placement and does not want to return back to her last ALF.  Past Psychiatric History:  Past Dx: chronic anxiety and depression. Psychiatrist: Dr. Dossie Der in [facility]  Outpatient psych meds: 0.5 mg of lorazepam and 10 mg of Lexapro chronically.  Patient denies h/o past suicidal attempts. Patient reports she has had one prior psychiatric hospital in [place] years ago for depression and SI.  Total Time spent with patient: 30  minutes  Is the patient at risk to self? No.  Has the patient been a risk to self in the past 6 months? No.  Has the patient been a risk to self within the distant past? No.  Is the patient a risk to others? No.  Has the patient been a risk to others in the past 6 months? No.  Has the patient been a risk to others within the distant past? No.   Prior Inpatient Therapy:   Prior Outpatient Therapy:    Alcohol Screening: 1. How often do you have a drink containing alcohol?: Never 2. How many drinks containing alcohol do you have on a typical day when you are drinking?: 1 or 2 3. How often do you have six or more drinks on one occasion?: Never AUDIT-C Score: 0 4. How often during the last year have you found that you were not able to stop drinking once you had started?: Never 5. How often during the last year have you failed to do what was normally expected from you because of drinking?: Never 6. How often during the last year have you needed a first drink in the morning to get yourself going after a heavy drinking session?: Never 7. How often during the last year have you had a feeling of guilt of remorse after drinking?: Never 8. How often during the last year have you been unable to remember what happened the night before because you had been drinking?: Never 9. Have you or someone else been injured as a result of your drinking?: No 10. Has a relative or friend or a doctor or another health worker been concerned about your drinking or suggested you cut down?: No Alcohol Use Disorder Identification Test Final  Score (AUDIT): 0 Alcohol Brief Interventions/Follow-up: AUDIT Score <7 follow-up not indicated Substance Abuse History in the last 12 months:  No. Consequences of Substance Abuse: NA Previous Psychotropic Medications: Yes  Psychological Evaluations: Yes  Past Medical History:  Past Medical History:  Diagnosis Date  . Anxiety   . Bradycardia   . Breast cancer, left breast ([place])  1995  . Breast cancer, right breast ([place]) 2002  . CHF (congestive heart failure) ([place])   . Depression    Dr Cheryln Manly  . Discoid lupus    skin  . Full dentures   . GERD (gastroesophageal reflux disease)   . Glaucoma   . Gout   . Hx of echocardiogram    a.  Echocardiogram (03/2011): Mild LVH, EF 37-85%, grade 1 diastolic dysfunction, mild MR, mild to moderate TR, PASP 42;  b. Echo (05/2013):  Mod LVH, EF 65-70%, no WMA, Gr 1 DD, mild MR, mod TR, PASP 39  . Hx of exercise stress test    a. ETT-echocardiogram (06/08/11): Sub-optimal exercise. Test stopped early due to dizziness and hypotension. EF 60%.  Non-diagnostic.;   b. Lexiscan Myoview (06/2013):  Diaphragmatic attenuation, no ischemia, EF 61%.  Low Risk  . Hypertension   . Hypothyroidism   . IBS (irritable bowel syndrome)   . Incontinence   . OSA (obstructive sleep apnea) 07/20/2016   Mild with AHI 12/hr now on CPAP  . Osteoarthritis   . Osteopenia   . Pacemaker 2009   [place]y//Chronotropic incompetence with normal pacemaker function  . Thyroid disease   . Wears hearing aid    both ears    Past Surgical History:  Procedure Laterality Date  . BREAST BIOPSY Left 1995  . BREAST LUMPECTOMY Left 1995   axillary node dissectoin  . CARDIAC CATHETERIZATION N/A 02/13/2016   Procedure: Right Heart Cath;  Surgeon: Larey Dresser, MD;  Location: [facility];  Service: Cardiovascular;  Laterality: N/A;  . CATARACT EXTRACTION W/ INTRAOCULAR LENS  IMPLANT, BILATERAL Bilateral 06/2009 - 12.2915   left - right  . DORSAL COMPARTMENT RELEASE  2000   left  . HAMMER TOE SURGERY Left 2004  . HEMORRHOID BANDING    . INSERT / REPLACE / REMOVE PACEMAKER  2009  . KNEE ARTHROSCOPY Left 08/29/2012   Procedure: LEFT KNEE ARTHROSCOPY ;  Surgeon: Hessie Dibble, MD;  Location: [facility];  Service: Orthopedics;  Laterality: Left;  [name] MASTECTOMY Bilateral 09/2000   nbx  . PPM GENERATOR CHANGEOUT N/A 07/16/2019   Procedure: PPM  GENERATOR CHANGEOUT;  Surgeon: Deboraha Sprang, MD;  Location: [facility];  Service: Cardiovascular;  Laterality: N/A;  . TRIGGER FINGER RELEASE Right 07/31/2013   Procedure: EXCISE MASS RIGHT INDEX A-1 PLLLEY RELEASE A-1 RIGHT INDEX;  Surgeon: Cammie Sickle., MD;  Location: [facility];  Service: Orthopedics;  Laterality: Right;  [name] VAGINAL HYSTERECTOMY     Family History:  Family History  Problem Relation Age of Onset  . Ovarian cancer Mother   . Heart disease Father   . Stroke Sister   . Stroke Brother   . Cancer Brother   . Heart disease Brother    Family Psychiatric  History: unknown  Tobacco Screening: Have you used any form of tobacco in the last 30 days? (Cigarettes, Smokeless Tobacco, Cigars, and/or Pipes): No Social History:  Social History   Substance and Sexual Activity  Alcohol Use No  . Alcohol/week: 0.0 standard drinks  Social History   Substance and Sexual Activity  Drug Use No    Additional Social History:  Lives at ALF, prio to that - with family. Has three children      Allergies:   Allergies  Allergen Reactions  . Lactase Diarrhea  . Amlodipine Besy-Benazepril Hcl Swelling  . Amlodipine Besylate Swelling  . Clonidine Hydrochloride     REACTION: tired  . Lactose Intolerance (Gi) Diarrhea and Other (See Comments)  . Valsartan Other (See Comments)    Tongue swelling   . Verapamil Other (See Comments)    unknown   Lab Results:  Results for orders placed or performed during the hospital encounter of 05/09/20 (from the past 48 hour(s))  Comprehensive metabolic panel     Status: Abnormal   Collection Time: 05/09/20 11:51 AM  Result Value Ref Range   Sodium 145 135 - 145 mmol/L   Potassium 3.8 3.5 - 5.1 mmol/L   Chloride 105 98 - 111 mmol/L   CO2 26 22 - 32 mmol/L   Glucose, Bld 85 70 - 99 mg/dL    Comment: Glucose reference range applies only to samples taken after fasting for at least 8 hours.   BUN 31 (H) 8 - 23  mg/dL   Creatinine, Ser 1.16 (H) 0.44 - 1.00 mg/dL   Calcium 9.8 8.9 - 10.3 mg/dL   Total Protein 7.3 6.5 - 8.1 g/dL   Albumin 3.9 3.5 - 5.0 g/dL   AST 21 15 - 41 U/L   ALT 13 0 - 44 U/L   Alkaline Phosphatase 51 38 - 126 U/L   Total Bilirubin 1.6 (H) 0.3 - 1.2 mg/dL   GFR, Estimated 45 (L) >60 mL/min    Comment: (NOTE) Calculated using the CKD-EPI Creatinine Equation (2021)    Anion gap 14 5 - 15    Comment: Performed at [facility], [address] [place], [place] [address]  Ethanol     Status: None   Collection Time: 05/09/20 11:51 AM  Result Value Ref Range   Alcohol, Ethyl (B) <10 <10 mg/dL    Comment: (NOTE) Lowest detectable limit for serum alcohol is 10 mg/dL.  For medical purposes only. Performed at [facility], [address] [place], [place] [id number]   Salicylate level     Status: Abnormal   Collection Time: 05/09/20 11:51 AM  Result Value Ref Range   Salicylate Lvl [id number] (L) 7.0 - 30.0 mg/dL    Comment: Performed at [facility], [place], [place] [address]  Acetaminophen level     Status: Abnormal   Collection Time: 05/09/20 11:51 AM  Result Value Ref Range   Acetaminophen (Tylenol), Serum <10 (L) 10 - 30 ug/mL    Comment: (NOTE) Therapeutic concentrations vary significantly. A range of 10-30 ug/mL  may be an effective concentration for many patients. However, some  are best treated at concentrations outside of this range. Acetaminophen concentrations >150 ug/mL at 4 hours after ingestion  and >50 ug/mL at 12 hours after ingestion are often associated with  toxic reactions.  Performed at [facility], [address] [place], [place] [address]   cbc     Status: Abnormal   Collection Time: 05/09/20 11:51 AM  Result Value Ref Range   WBC 2.8 (L) 4.0 - 10.5 K/uL   RBC 3.86 (L) 3.87 - 5.11 MIL/uL   Hemoglobin 12.8 12.0 - 15.0 g/dL   HCT 38.4 36.0 - 46.0 %   MCV 99.5  80.0 - 100.0 fL   MCH 33.2  26.0 - 34.0 pg   MCHC 33.3 30.0 - 36.0 g/dL   RDW 14.1 11.5 - 15.5 %   Platelets 162 150 - 400 K/uL   nRBC 0.0 0.0 - 0.2 %    Comment: Performed at [facility], [address] [place], [place] [address]  TSH     Status: None   Collection Time: 05/09/20 11:51 AM  Result Value Ref Range   TSH 2.390 0.350 - 4.500 uIU/mL    Comment: Performed by a 3rd Generation assay with a functional sensitivity of <=0.01 uIU/mL. Performed at [facility], [address] [place], [place] [address]   Resp Panel by RT-PCR (Flu A&B, Covid) Nasopharyngeal Swab     Status: None   Collection Time: 05/09/20  2:31 PM   Specimen: Nasopharyngeal Swab; Nasopharyngeal(NP) swabs in vial transport medium  Result Value Ref Range   SARS Coronavirus 2 by RT PCR NEGATIVE NEGATIVE    Comment: (NOTE) SARS-CoV-2 target nucleic acids are NOT DETECTED.  The SARS-CoV-2 RNA is generally detectable in upper respiratory specimens during the acute phase of infection. The lowest concentration of SARS-CoV-2 viral copies this assay can detect is 138 copies/mL. A negative result does not preclude SARS-Cov-2 infection and should not be used as the sole basis for treatment or other patient management decisions. A negative result may occur with  improper specimen collection/handling, submission of specimen other than nasopharyngeal swab, presence of viral mutation(s) within the areas targeted by this assay, and inadequate number of viral copies(<138 copies/mL). A negative result must be combined with clinical observations, patient history, and epidemiological information. The expected result is Negative.  Fact Sheet for Patients:  EntrepreneurPulse.com.au  Fact Sheet for Healthcare Providers:  IncredibleEmployment.be  This test is no t yet approved or cleared by the [place] FDA and  has been authorized for detection and/or diagnosis of SARS-CoV-2 by FDA under an  Emergency Use Authorization (EUA). This EUA will remain  in effect (meaning this test can be used) for the duration of the COVID-19 declaration under Section 564(b)(1) of the Act, 21 U.S.C.section 360bbb-3(b)(1), unless the authorization is terminated  or revoked sooner.       Influenza A by PCR NEGATIVE NEGATIVE   Influenza B by PCR NEGATIVE NEGATIVE    Comment: (NOTE) The Xpert Xpress SARS-CoV-2/FLU/RSV plus assay is intended as an aid in the diagnosis of influenza from Nasopharyngeal swab specimens and should not be used as a sole basis for treatment. Nasal washings and aspirates are unacceptable for Xpert Xpress SARS-CoV-2/FLU/RSV testing.  Fact Sheet for Patients: EntrepreneurPulse.com.au  Fact Sheet for Healthcare Providers: IncredibleEmployment.be  This test is not yet approved or cleared by the [place] FDA and has been authorized for detection and/or diagnosis of SARS-CoV-2 by FDA under an Emergency Use Authorization (EUA). This EUA will remain in effect (meaning this test can be used) for the duration of the COVID-19 declaration under Section 564(b)(1) of the Act, 21 U.S.C. section 360bbb-3(b)(1), unless the authorization is terminated or revoked.  Performed at [facility], [address] [place], [place] [address]    *Note: Due to a large number of results and/or encounters for the requested time period, some results have not been displayed. A complete set of results can be found in Results Review.    Blood Alcohol level:  Lab Results  Component Value Date   ETH <10 [id number]    Metabolic Disorder Labs:  Lab  Results  Component Value Date   HGBA1C 6.0 04/10/2012   No results found for: PROLACTIN Lab Results  Component Value Date   CHOL 174 05/11/2010   TRIG 78.0 05/11/2010   HDL 52.30 05/11/2010   CHOLHDL 3 05/11/2010   VLDL 15.6 05/11/2010   LDLCALC 106 (H) 05/11/2010   LDLCALC 105 (H) 10/14/2009     Current Medications: Current Facility-Administered Medications  Medication Dose Route Frequency Provider Last Rate Last Admin  . acetaminophen (TYLENOL) tablet 650 mg  650 mg Oral Q6H PRN [name], [name], MD      . alum & mag hydroxide-simeth (MAALOX/MYLANTA) 200-200-20 MG/5ML suspension 30 mL  30 mL Oral Q4H PRN [name], [name], MD      . apixaban (ELIQUIS) tablet 5 mg  5 mg Oral BID [name], Madie Reno, MD   5 mg at 05/10/20 0827  . escitalopram (LEXAPRO) tablet 10 mg  10 mg Oral Daily [name], [name], MD   10 mg at 05/10/20 0827  . famotidine (PEPCID) tablet 10 mg  10 mg Oral BID [name], Madie Reno, MD   10 mg at 05/10/20 0827  . [START ON 05/11/2020] influenza vaccine adjuvanted (FLUAD) injection 0.5 mL  0.5 mL Intramuscular Tomorrow-1000 Salley Scarlet, MD      . levothyroxine (SYNTHROID) tablet 100 mcg  100 mcg Oral Q0600 [name], Madie Reno, MD   100 mcg at 05/10/20 0651  . loratadine (CLARITIN) tablet 10 mg  10 mg Oral Daily [name], Madie Reno, MD   10 mg at 05/10/20 0827  . LORazepam (ATIVAN) tablet 0.5 mg  0.5 mg Oral Daily [name], [name], MD   0.5 mg at 05/10/20 0827  . magnesium hydroxide (MILK OF MAGNESIA) suspension 30 mL  30 mL Oral Daily PRN [name], Madie Reno, MD      . Derrill Memo ON 05/11/2020] pneumococcal 23 valent vaccine (PNEUMOVAX-23) injection 0.5 mL  0.5 mL Intramuscular Tomorrow-1000 Salley Scarlet, MD      . polyvinyl alcohol (LIQUIFILM TEARS) 1.4 % ophthalmic solution 1 drop  1 drop Both Eyes PRN [name], [name], MD      . spironolactone (ALDACTONE) tablet 25 mg  25 mg Oral Daily [name], Madie Reno, MD   25 mg at 05/10/20 0827  . traZODone (DESYREL) tablet 100 mg  100 mg Oral QHS PRN [name], Madie Reno, MD       PTA Medications: Medications Prior to Admission  Medication Sig Dispense Refill Last Dose  . apixaban (ELIQUIS) 5 MG TABS tablet Take 1 tablet (5 mg total) by mouth 2 (two) times daily. Annual appt is due must see provider for future refills 10 tablet 1   . cetirizine (ZYRTEC)  10 MG tablet Take 1 tablet (10 mg total) by mouth daily. 100 tablet 3   . clotrimazole-betamethasone (LOTRISONE) cream Apply 1 application topically in the morning and at bedtime.      [name] escitalopram (LEXAPRO) 10 MG tablet Take 1 tablet (10 mg total) by mouth at bedtime. 30 tablet 0   . famotidine (PEPCID) 10 MG tablet Take 10 mg by mouth 2 (two) times daily.      [name] LORazepam (ATIVAN) 0.5 MG tablet Take 1 tablet (0.5 mg total) by mouth daily. 90 tablet 0   . magnesium oxide (MAG-OX) 400 MG tablet Take 0.5 tablets (200 mg total) by mouth daily. Needs appt for further refills 45 tablet 0   . REFRESH TEARS 0.5 % SOLN Place 1 drop into both eyes daily as  needed (dry eyes).   0   . spironolactone (ALDACTONE) 25 MG tablet Take 1 tablet (25 mg total) by mouth daily. (Patient not taking: Reported on 10/09/2019) 90 tablet 1   . SYNTHROID 100 MCG tablet Take 1 tablet (100 mcg total) by mouth daily before breakfast. Follow-up appt is due w/labs must see provider for future refills 30 tablet 0   . tolterodine (DETROL LA) 2 MG 24 hr capsule Take 1 capsule (2 mg total) by mouth daily. 90 capsule 3     Musculoskeletal: Strength & Muscle Tone: within normal limits Gait & Station: normal Patient leans: N/A  Psychiatric Specialty Exam: Physical Exam Vitals and nursing note reviewed.  Constitutional:      Appearance: She is normal weight.  HENT:     Head: Normocephalic and atraumatic.  Eyes:     Extraocular Movements: Extraocular movements intact.     Pupils: Pupils are equal, round, and reactive to light.  Cardiovascular:     Rate and Rhythm: Normal rate and regular rhythm.     Pulses: Normal pulses.  Pulmonary:     Effort: Pulmonary effort is normal. No respiratory distress.     Breath sounds: Normal breath sounds.  Abdominal:     General: Abdomen is flat.     Palpations: Abdomen is soft.  Musculoskeletal:        General: Normal range of motion.     Cervical back: Normal range of motion.   Skin:    General: Skin is warm and dry.  Neurological:     General: No focal deficit present.     Mental Status: She is alert.  Psychiatric:        Behavior: Behavior normal.     Review of Systems  Constitutional: Negative for chills and fever.  Respiratory: Negative for cough and shortness of breath.   Cardiovascular: Negative for chest pain.  Gastrointestinal: Negative for abdominal pain, constipation, diarrhea and vomiting.  Neurological: Negative for tremors and seizures.  Psychiatric/Behavioral: Positive for dysphoric mood. Negative for agitation, behavioral problems, confusion, hallucinations, self-injury and suicidal ideas. The patient is nervous/anxious.     Blood pressure 130/83, pulse 84, temperature 98 F (36.7 C), temperature source Oral, resp. rate 18, height 5\' 4"  (1.626 m), weight 63.7 kg, SpO2 100 %.Body mass index is 24.12 kg/m.  General Appearance: Casual  Eye Contact:  Good  Speech:  Normal Rate  Volume:  Normal  Mood:  Anxious and Depressed  Affect:  Congruent  Thought Process:  Coherent  Orientation:  Full (Time, Place, and Person)  Thought Content:  Logical  Suicidal Thoughts:  No  Homicidal Thoughts:  No  Memory:  Immediate;   Fair Recent;   Fair Remote;   Fair  Judgement:  Fair  Insight:  Fair  Psychomotor Activity:  Decreased  Concentration:  Concentration: Fair  Recall:  [employer] of Knowledge:  Fair  Language:  Fair  Akathisia:  No  Handed:  Right  AIMS (if indicated):     Assets:  Communication Skills Desire for Improvement Housing Resilience Social Support  ADL's:  Intact  Cognition:  WNL  Sleep:  Number of Hours: 4    Treatment Plan Summary: Daily contact with patient to assess and evaluate symptoms and progress in treatment and Medication management   ASSESSMENT: Patient is seen and examined.  Patient is a 87 year old female with the above-stated past psychiatric and medical history who was admitted due to worsened  depression, anxiety and suicidal thoughts secondary not  taking her psychotropic medications for about three weeks. Psych medications restarted on admission, patient reports mood and sleep improvement already.  PLAN: -inpatient psychiatric admission will be continued. -patient will be integrated in the milieu.   -patient will be encouraged to attend groups.    -Medications: We will continue Lexapro 10mg  daily for anxiety and depression and Lorazepam 0.5mg  daily for anxiety.  -We will attempt to collect collateral information.  -Disposition will be determined after the patient is stabilized.   Observation Level/Precautions:  15 minute checks  Laboratory:    Psychotherapy:    Medications:    Consultations:    Discharge Concerns:    Estimated LOS:  Other:     Physician Treatment Plan for Primary Diagnosis: <principal problem not specified> Long Term Goal(s): Improvement in symptoms so as ready for discharge  Short Term Goals: Ability to identify changes in lifestyle to reduce recurrence of condition will improve, Ability to verbalize feelings will improve, Ability to disclose and discuss suicidal ideas, Ability to demonstrate self-control will improve, Ability to identify and develop effective coping behaviors will improve, Ability to maintain clinical measurements within normal limits will improve, Compliance with prescribed medications will improve and Ability to identify triggers associated with substance abuse/mental health issues will improve  Physician Treatment Plan for Secondary Diagnosis: Active Problems:   Moderately severe recurrent major depression ([place])  Long Term Goal(s): Improvement in symptoms so as ready for discharge  Short Term Goals: Ability to identify changes in lifestyle to reduce recurrence of condition will improve, Ability to verbalize feelings will improve, Ability to disclose and discuss suicidal ideas, Ability to demonstrate self-control will improve,  Ability to identify and develop effective coping behaviors will improve, Ability to maintain clinical measurements within normal limits will improve, Compliance with prescribed medications will improve and Ability to identify triggers associated with substance abuse/mental health issues will improve  I certify that inpatient services furnished can reasonably be expected to improve the patient's condition.    Larita Fife, MD 1/15/20229:55 AM

## 2020-05-10 NOTE — [facility] Group Notes (Signed)
[facility] Group Notes: (Clinical Social Work)   05/10/2020      Type of Therapy:  Group Therapy   Participation Level:  Did Not Attend - was invited individually by Nurse/MHT and chose not to attend.   Raina Mina, [place] 05/10/2020  2:36 PM

## 2020-05-10 NOTE — Tx Team (Signed)
Initial Treatment Plan 05/10/2020 2:01 AM [name]:[id number]    PATIENT STRESSORS: Health problems Medication change or noncompliance   PATIENT STRENGTHS: Ability for insight General fund of knowledge Motivation for treatment/growth   PATIENT IDENTIFIED PROBLEMS: Suicidal Ideation     Anxiety                  DISCHARGE CRITERIA:  Improved stabilization in mood, thinking, and/or behavior Motivation to continue treatment in a less acute level of care  PRELIMINARY DISCHARGE PLAN: Outpatient therapy  PATIENT/FAMILY INVOLVEMENT: This treatment plan has been presented to and reviewed with the patient, [name] The patient and family have been given the opportunity to ask questions and make suggestions.  Harl Bowie, RN 05/10/2020, 2:01 AM

## 2020-05-10 NOTE — Plan of Care (Signed)
D- Patient alert and oriented. Patient presents in a pleasant mood on assessment stating that she slept "good" last night, "they gave me Ativan". Patient's only complaint was that she is cold. Patient endorsed both depression and anxiety stating "because I said that word and I came over here. I should not be over here, I'm the oldest one here". Patient denies SI, HI, AVH, and pain at this time.  Patient had no stated goals for today.   A- Scheduled medications administered to patient, per MD orders. Support and encouragement provided.  Routine safety checks conducted every 15 minutes.  Patient informed to notify staff with problems or concerns.  R- No adverse drug reactions noted. Patient contracts for safety at this time. Patient compliant with medications and treatment plan. Patient receptive, calm, and cooperative. Patient interacts well with others on the unit.  Patient remains safe at this time.  Problem: Consults Goal: Concurrent Medical Patient Education Description: (See Patient Education Module for education specifics) Outcome: Progressing   Problem: [facility] Concurrent Medical Problem Goal: LTG-Pt will be physically stable and he/significant other Description: (Patient will be physically stable and he/significant other will be able to verbalize understanding of follow-up care and symptoms that would warrant further treatment) Outcome: Progressing Goal: STG-Vital signs will be within defined limits or stabilized Description: (STG- Vital signs will be within defined limits or stabilized for individual) Outcome: Progressing Goal: STG-Compliance with medication and/or treatment as ordered Description: (STG-Compliance with medication and/or treatment as ordered by MD) Outcome: Progressing Goal: STG-Verbalize two symptoms that would warrant further Description: (STG-Verbalize two symptoms that would warrant further treatment) Outcome: Progressing Goal: STG-Patient will participate in  management/stabilization Description: (STG-Patient will participate in management/stabilization of medical condition) Outcome: Progressing Goal: STG-Other (Specify): Description: STG-Other Concurrent Medical (Specify): Outcome: Progressing   Problem: Education: Goal: Ability to make informed decisions regarding treatment will improve Outcome: Progressing   Problem: Coping: Goal: Coping ability will improve Outcome: Progressing   Problem: Health Behavior/Discharge Planning: Goal: Identification of resources available to assist in meeting health care needs will improve Outcome: Progressing   Problem: Medication: Goal: Compliance with prescribed medication regimen will improve Description: Patient complaint with prescribed medication regimen  Outcome: Progressing   Problem: Self-Concept: Goal: Ability to disclose and discuss suicidal ideas will improve Outcome: Progressing Goal: Will verbalize positive feelings about self Outcome: Progressing

## 2020-05-10 NOTE — Progress Notes (Signed)
Patient requested to have her hat out of her belongings reporting that she is still cold. This Probation officer provided patient with her hat, she put it on her head and went back to sleep.

## 2020-05-10 NOTE — Progress Notes (Signed)
Patient requested to get some phone numbers out of the her cell phone. This Probation officer took patient her purse and she was able to pull up her children's phone numbers. This Probation officer spoke with Legrand Como and Precious Bard, her son and daughter.

## 2020-05-10 NOTE — [facility] Suicide Risk Assessment (Addendum)
[place] INPATIENT:  Family/Significant Other Suicide Prevention Education  Suicide Prevention Education:  Contact Attempts: [name] [phone], (Daughter) has been identified by the patient as the family member/significant other with whom the patient will be residing, and identified as the person(s) who will aid the patient in the event of a mental health crisis.  With written consent from the patient, two attempts were made to provide suicide prevention education, prior to and/or following the patient's discharge.  We were unsuccessful in providing suicide prevention education.  A suicide education pamphlet was given to the patient to share with family/significant other.  Date and time of first attempt: 05/10/2020/2:37 PM (VM is not setup and unable to leave message) Date and time of second attempt: 05/10/20/3:23 PM (VM is not setup and unable to leave message) Third attempt: 05/11/20/9:06 AM (VM is not setup and unable to leave message)  Raina Mina 05/10/2020, 2:37 PM

## 2020-05-10 NOTE — [facility] Suicide Risk Assessment (Signed)
[facility] Admission Suicide Risk Assessment   Nursing information obtained from:  Patient Demographic factors:  Age 87 or older Current Mental Status:  NA Loss Factors:  Decrease in vocational status,Decline in physical health Historical Factors:  Impulsivity Risk Reduction Factors:  Religious beliefs about death  Total Time spent with patient: 30 minutes Principal Problem: <principal problem not specified> Diagnosis:  Active Problems:   Moderately severe recurrent major depression ([place])  Subjective Data: Patient is a 87 year old female with the past psychiatric history of depression and anxiety, who was admitted due to worsened depression, anxiety and suicidal thoughts secondary not taking her psychotropic medications for about three weeks.   CLINICAL FACTORS:   Depression:   Insomnia  COGNITIVE FEATURES THAT CONTRIBUTE TO RISK:  None    SUICIDE RISK:   Mild:  Suicidal ideation of limited frequency, intensity, duration, and specificity.  There are no identifiable plans, no associated intent, mild dysphoria and related symptoms, good self-control (both objective and subjective assessment), few other risk factors, and identifiable protective factors, including available and accessible social support.  PLAN OF CARE:  Patient is seen and examined. Patient is a 87 year old female with the above-stated past psychiatric and medical history who was admitted due to worsened depression, anxiety and suicidal thoughts secondary not taking her psychotropic medications for about three weeks. Psych medications restarted on admission, patient reports mood and sleep improvement already.  PLAN: -inpatient psychiatric admission will be continued. -patient will be integrated in the milieu.  -patient will be encouraged to attend groups.   -Medications: We will continue Lexapro 10mg  daily for anxiety and depression and Lorazepam 0.5mg  daily for anxiety.  -We will attempt to collect collateral  information.  -Disposition will be determined after the patient is stabilized.   I certify that inpatient services furnished can reasonably be expected to improve the patient's condition.   Larita Fife, MD 05/10/2020, 10:18 AM

## 2020-05-10 NOTE — [facility] Counselor (Signed)
Adult Comprehensive Assessment  Patient ID: [name], female   DOB: 07-Dec-1929, 87 y.o.   MRN: [id number]  Information Source: Information source: Patient  Current Stressors:  Patient states their primary concerns and needs for treatment are:: Patient states she needed her medication and now she is getting feeling better. Patient states she is always cold but knows due to her age. Patient states their goals for this hospitilization and ongoing recovery are:: Patient wanted to get her medications. Educational / Learning stressors: No Employment / Job issues: No Family Relationships: No. Son is in [place], other son doesn't believe in going to doctors or medications. Daughter got upset and wanted her to go back to the nursing house Financial / Lack of resources (include bankruptcy): Retirement Housing / Lack of housing: Nursing Home. My daughter put me there and she does not deal with her anymore. Physical health (include injuries & life threatening diseases): Patient stated she has issues with her lungs and they were bleeding Social relationships: Unable to answer Substance abuse: None Bereavement / Loss: None.  Living/Environment/Situation:  Living Arrangements: Other (Comment) (Nursing home) Living conditions (as described by patient or guardian): Patient does not like the home she lives in and feel they cannnot meet her needs How long has patient lived in current situation?: May 17, 2019  Family History:  Marital status: Divorced Are you sexually active?: No What is your sexual orientation?: Heterosexual Has your sexual activity been affected by drugs, alcohol, medication, or emotional stress?: No Does patient have children?: Yes How many children?: 3 How is patient's relationship with their children?: Patient has strained relationship with children  Childhood History:  By whom was/is the patient raised?: Sibling Additional childhood history information: Mother died when  she was young. Lived with different siblings Description of patient's relationship with caregiver when they were a child: No place to call home. Mother died when she was young Patient's description of current relationship with people who raised him/her: Patient did not get along with all her siblings Does patient have siblings?: Yes Number of Siblings:  (Patient states she has multiple siblings) Description of patient's current relationship with siblings: Siblings deceased Did patient suffer any verbal/emotional/physical/sexual abuse as a child?: No Did patient suffer from severe childhood neglect?: No Has patient ever been sexually abused/assaulted/raped as an adolescent or adult?: No Was the patient ever a victim of a crime or a disaster?: No Witnessed domestic violence?: No Has patient been affected by domestic violence as an adult?: No  Education:  Highest grade of school patient has completed: [employer]. Did not finish Currently a student?: No Learning disability?: No  Employment/Work Situation:   Employment situation: Retired Archivist job has been impacted by current illness: No What is the longest time patient has a held a job?: 23 years Where was the patient employed at that time?: [employer] Services Has patient ever been in the [employer]?: No  Financial Resources:   Museum/gallery curator resources: [employer] (Retirement) Does patient have a Programmer, applications or guardian?: No  Alcohol/Substance Abuse:   What has been your use of drugs/alcohol within the last 12 months?: No If attempted suicide, did drugs/alcohol play a role in this?: No Alcohol/Substance Abuse Treatment Hx: Denies past history Has alcohol/substance abuse ever caused legal problems?: No  Social Support System:   Heritage manager System: Poor Describe Community Support System: Patient does not have a good relationships with children Type of faith/religion: [employer] day [employer]ist How does patient's faith  help to cope with current  illness?: I haven't been to church for awhile. I used to have bible study  Leisure/Recreation:   Do You Have Hobbies?: Yes Leisure and Hobbies: I like to read  Strengths/Needs:   What is the patient's perception of their strengths?: I'm good at being kind and considerate. I do what I can and I will give my last dime Patient states they can use these personal strengths during their treatment to contribute to their recovery: Yes Patient states these barriers may affect/interfere with their treatment: No Patient states these barriers may affect their return to the community: Patient does not like her group home. Patient is not sure Other important information patient would like considered in planning for their treatment: No  Discharge Plan:   Currently receiving community mental health services: No Patient states concerns and preferences for aftercare planning are: Patient is unsure and wanted to go back on her medication Patient states they will know when they are safe and ready for discharge when: When I feel like I've rest enough and find a facility I will be comfortable in Does patient have access to transportation?: Yes Does patient have financial barriers related to discharge medications?: No Patient description of barriers related to discharge medications: None Will patient be returning to same living situation after discharge?:  (Patient is unsure)  Summary/Recommendations:   Summary and Recommendations (to be completed by the evaluator): Patient is a 87 year old female who resides in [place]. Patient was admitted to [facility] ED due to having suicidal ideations. Patient states that her assisted living facility was not giving her the medications that she needs. Patient stated that she does not feel comfortable going back to the facility. Patient stated that the facility does not meet her needs and they also do not give her the medication she needs. Patient  could possibly benefit from home health. Patient currently denies SI/HI/AH. Patient will benefit from crisis stabilization, medication evaluation, group therapy and psychoeducation, in addition to case management for discharge planning. At discharge it is recommended that Patient adhere to the established discharge plan and continue in treatment.  Raina Mina. 05/10/2020

## 2020-05-10 NOTE — Progress Notes (Signed)
Admission Note:  87 yr female who presents IVC in no acute distress for the treatment of SI and Depression. Patient is alert and oriented x 4 , affect is flat but she brightens upon approach, she appears anxious, her thoughts are organized , speech is soft non pressured. Patient was upset she was admitted on the unit but was later calm and cooperative with admission process, she currently denies SI/HI/AVH and contracts for safety upon admission. Patient has Past medical Hx of Anxiety, Depression, GERD, hx of, hysterectomy,  breast cancer, Gout, Thyroid disease, incontinence, Irritable Bowel Syndrome, Pacemaker, osteoarthritis and osteopenia.  Patient's skin was assessed and found to be clear of any abnormal marks apart from a surgical scar from both breast from when she had cancer. patient was also searched and no contraband found, POC and unit policies explained and understanding verbalized, and consents obtained. Patient oriented to the unit and her room, 15 minutes safety checks maintained will continue to monitor.

## 2020-05-11 DIAGNOSIS — F332 Major depressive disorder, recurrent severe without psychotic features: Secondary | ICD-10-CM | POA: Diagnosis not present

## 2020-05-11 MED ORDER — POLYETHYLENE GLYCOL 3350 17 G PO PACK
17.0000 g | PACK | Freq: Every day | ORAL | Status: DC | PRN
  Administered 2020-05-12: 17 g via ORAL
  Filled 2020-05-11: qty 1

## 2020-05-11 MED ORDER — WHITE PETROLATUM EX OINT: TOPICAL_OINTMENT | CUTANEOUS | Status: DC | PRN

## 2020-05-11 NOTE — Progress Notes (Signed)
[facility] MD Progress Note  05/11/2020 10:36 AM [name]  MRN:  [id number]  Principal Problem: <principal problem not specified> Diagnosis: Active Problems:   Moderately severe recurrent major depression ([place])  Total Time spent with patient: 85 minutes  87 year old female with the history of depression and anxiety, who was admitted due to worsened depression, anxiety and suicidal thoughts secondary not taking her psychotropic medications for about three weeks.  Interval History Patient was seen today for re-evaluation.  Nursing reports no events overnight. The patient has no issues with performing ADLs.  Patient has been medication compliant.    Subjective:  On assessment patient reports feeling sad due to "I put myself to this situation". She reports that she is feeling guilty for disclosing her depression and suicidal thoughts resulted bu this admission, loosing ALF room and creating inconvenience for her children. She reports she does not want to talk to her daughter as she things daughter is angry at her. She asks [place] to call her daughter and talk with her; patient notified that SW made several attempts to speak with her daughter, unsuccessful so far. Patient reports feeling less depressed, not anymore suicidal and sleeping better after her medications were restarted. She is very concerned and anxious due to unclear future placement. She does not want to return to the same ALF as she believes they neglected her there; she cannot stay with family members.  Current suicidal/homicidal ideations: Denies Current auditory/visual hallucinations: Denies The patient reports no side effects from medications.    Labs: no new results for review.     Past Psychiatric History: see H&P   Past Medical History:  Past Medical History:  Diagnosis Date  . Anxiety   . Bradycardia   . Breast cancer, left breast ([place]) 1995  . Breast cancer, right breast ([place]) 2002  . CHF (congestive heart failure) ([place])    . Depression    Dr Cheryln Manly  . Discoid lupus    skin  . Full dentures   . GERD (gastroesophageal reflux disease)   . Glaucoma   . Gout   . Hx of echocardiogram    a.  Echocardiogram (03/2011): Mild LVH, EF 0000000, grade 1 diastolic dysfunction, mild MR, mild to moderate TR, PASP 42;  b. Echo (05/2013):  Mod LVH, EF 65-70%, no WMA, Gr 1 DD, mild MR, mod TR, PASP 39  . Hx of exercise stress test    a. ETT-echocardiogram (06/08/11): Sub-optimal exercise. Test stopped early due to dizziness and hypotension. EF 60%.  Non-diagnostic.;   b. Lexiscan Myoview (06/2013):  Diaphragmatic attenuation, no ischemia, EF 61%.  Low Risk  . Hypertension   . Hypothyroidism   . IBS (irritable bowel syndrome)   . Incontinence   . OSA (obstructive sleep apnea) 07/20/2016   Mild with AHI 12/hr now on CPAP  . Osteoarthritis   . Osteopenia   . Pacemaker 2009   Brady//Chronotropic incompetence with normal pacemaker function  . Thyroid disease   . Wears hearing aid    both ears    Past Surgical History:  Procedure Laterality Date  . BREAST BIOPSY Left 1995  . BREAST LUMPECTOMY Left 1995   axillary node dissectoin  . CARDIAC CATHETERIZATION N/A 02/13/2016   Procedure: Right Heart Cath;  Surgeon: Larey Dresser, MD;  Location: [facility];  Service: Cardiovascular;  Laterality: N/A;  . CATARACT EXTRACTION W/ INTRAOCULAR LENS  IMPLANT, BILATERAL Bilateral 06/2009 - 12.2915   left - right  . DORSAL COMPARTMENT RELEASE  2000   left  . HAMMER TOE SURGERY Left 2004  . HEMORRHOID BANDING    . INSERT / REPLACE / REMOVE PACEMAKER  2009  . KNEE ARTHROSCOPY Left 08/29/2012   Procedure: LEFT KNEE ARTHROSCOPY ;  Surgeon: Hessie Dibble, MD;  Location: [facility];  Service: Orthopedics;  Laterality: Left;  [name] MASTECTOMY Bilateral 09/2000   nbx  . PPM GENERATOR CHANGEOUT N/A 07/16/2019   Procedure: PPM GENERATOR CHANGEOUT;  Surgeon: Deboraha Sprang, MD;  Location: [facility];  Service:  Cardiovascular;  Laterality: N/A;  . TRIGGER FINGER RELEASE Right 07/31/2013   Procedure: EXCISE MASS RIGHT INDEX A-1 PLLLEY RELEASE A-1 RIGHT INDEX;  Surgeon: Cammie Sickle., MD;  Location: [facility];  Service: Orthopedics;  Laterality: Right;  [name] VAGINAL HYSTERECTOMY     Family History:  Family History  Problem Relation Age of Onset  . Ovarian cancer Mother   . Heart disease Father   . Stroke Sister   . Stroke Brother   . Cancer Brother   . Heart disease Brother    Family Psychiatric  History: see H&P  Social History:  Social History   Substance and Sexual Activity  Alcohol Use No  . Alcohol/week: 0.0 standard drinks     Social History   Substance and Sexual Activity  Drug Use No    Social History   Socioeconomic History  . Marital status: Widowed    Spouse name: Not on file  . Number of children: 3  . Years of education: 76  . Highest education level: Not on file  Occupational History  . Occupation: retired  Tobacco Use  . Smoking status: Never Smoker  . Smokeless tobacco: Never Used  Vaping Use  . Vaping Use: Never used  Substance and Sexual Activity  . Alcohol use: No    Alcohol/week: 0.0 standard drinks  . Drug use: No  . Sexual activity: Not Currently  Other Topics Concern  . Not on file  Social History Narrative   She is a vegan now 2010.     Lives at [facility].     Retired from Orthoptist.    Education: college.     3 children.    Social Determinants of Health   Financial Resource Strain: Not on file  Food Insecurity: Not on file  Transportation Needs: Not on file  Physical Activity: Not on file  Stress: Not on file  Social Connections: Not on file   Additional Social History:                         Sleep: Fair  Appetite:  Fair  Current Medications: Current Facility-Administered Medications  Medication Dose Route Frequency Provider Last Rate Last Admin  . acetaminophen (TYLENOL) tablet 650 mg   650 mg Oral Q6H PRN [name], [name], MD      . alum & mag hydroxide-simeth (MAALOX/MYLANTA) 200-200-20 MG/5ML suspension 30 mL  30 mL Oral Q4H PRN [name], [name], MD      . escitalopram (LEXAPRO) tablet 10 mg  10 mg Oral Daily [name], [name], MD   10 mg at 05/10/20 0827  . famotidine (PEPCID) tablet 10 mg  10 mg Oral BID [name], Madie Reno, MD   10 mg at 05/10/20 1652  . influenza vaccine adjuvanted (FLUAD) injection 0.5 mL  0.5 mL Intramuscular Tomorrow-1000 Salley Scarlet, MD      . levothyroxine (SYNTHROID) tablet  100 mcg  100 mcg Oral Q0600 [name], Madie Reno, MD   100 mcg at 05/11/20 0644  . loratadine (CLARITIN) tablet 10 mg  10 mg Oral Daily [name], Madie Reno, MD   10 mg at 05/10/20 0827  . LORazepam (ATIVAN) tablet 0.5 mg  0.5 mg Oral Daily [name], [name], MD   0.5 mg at 05/10/20 0827  . magnesium hydroxide (MILK OF MAGNESIA) suspension 30 mL  30 mL Oral Daily PRN [name], [name], MD      . pneumococcal 23 valent vaccine (PNEUMOVAX-23) injection 0.5 mL  0.5 mL Intramuscular Tomorrow-1000 Salley Scarlet, MD      . polyvinyl alcohol (LIQUIFILM TEARS) 1.4 % ophthalmic solution 1 drop  1 drop Both Eyes PRN [name], [name], MD      . spironolactone (ALDACTONE) tablet 25 mg  25 mg Oral Daily [name], Madie Reno, MD   25 mg at 05/10/20 0827  . traZODone (DESYREL) tablet 100 mg  100 mg Oral QHS PRN [name], Madie Reno, MD   100 mg at 05/10/20 2126    Lab Results:  Results for orders placed or performed during the hospital encounter of 05/09/20 (from the past 48 hour(s))  Comprehensive metabolic panel     Status: Abnormal   Collection Time: 05/09/20 11:51 AM  Result Value Ref Range   Sodium 145 135 - 145 mmol/L   Potassium 3.8 3.5 - 5.1 mmol/L   Chloride 105 98 - 111 mmol/L   CO2 26 22 - 32 mmol/L   Glucose, Bld 85 70 - 99 mg/dL    Comment: Glucose reference range applies only to samples taken after fasting for at least 8 hours.   BUN 31 (H) 8 - 23 mg/dL   Creatinine, Ser 1.16 (H) 0.44 - 1.00 mg/dL    Calcium 9.8 8.9 - 10.3 mg/dL   Total Protein 7.3 6.5 - 8.1 g/dL   Albumin 3.9 3.5 - 5.0 g/dL   AST 21 15 - 41 U/L   ALT 13 0 - 44 U/L   Alkaline Phosphatase 51 38 - 126 U/L   Total Bilirubin 1.6 (H) 0.3 - 1.2 mg/dL   GFR, Estimated 45 (L) >60 mL/min    Comment: (NOTE) Calculated using the CKD-EPI Creatinine Equation (2021)    Anion gap 14 5 - 15    Comment: Performed at [facility], [address] [place], [place] [address]  Ethanol     Status: None   Collection Time: 05/09/20 11:51 AM  Result Value Ref Range   Alcohol, Ethyl (B) <10 <10 mg/dL    Comment: (NOTE) Lowest detectable limit for serum alcohol is 10 mg/dL.  For medical purposes only. Performed at [facility], [address] [place], [place] [id number]   Salicylate level     Status: Abnormal   Collection Time: 05/09/20 11:51 AM  Result Value Ref Range   Salicylate Lvl Q000111Q (L) 7.0 - 30.0 mg/dL    Comment: Performed at [facility], [place], [place] [address]  Acetaminophen level     Status: Abnormal   Collection Time: 05/09/20 11:51 AM  Result Value Ref Range   Acetaminophen (Tylenol), Serum <10 (L) 10 - 30 ug/mL    Comment: (NOTE) Therapeutic concentrations vary significantly. A range of 10-30 ug/mL  may be an effective concentration for many patients. However, some  are best treated at concentrations outside of this range. Acetaminophen concentrations >150 ug/mL at 4 hours after ingestion  and >50  ug/mL at 12 hours after ingestion are often associated with  toxic reactions.  Performed at [facility], [address] [place], [place] [address]   cbc     Status: Abnormal   Collection Time: 05/09/20 11:51 AM  Result Value Ref Range   WBC 2.8 (L) 4.0 - 10.5 K/uL   RBC 3.86 (L) 3.87 - 5.11 MIL/uL   Hemoglobin 12.8 12.0 - 15.0 g/dL   HCT 38.4 36.0 - 46.0 %   MCV 99.5 80.0 - 100.0 fL   MCH 33.2 26.0 - 34.0 pg   MCHC 33.3 30.0 - 36.0 g/dL   RDW  14.1 11.5 - 15.5 %   Platelets 162 150 - 400 K/uL   nRBC 0.0 0.0 - 0.2 %    Comment: Performed at [facility], [address] [place], [place] [address]  TSH     Status: None   Collection Time: 05/09/20 11:51 AM  Result Value Ref Range   TSH 2.390 0.350 - 4.500 uIU/mL    Comment: Performed by a 3rd Generation assay with a functional sensitivity of <=0.01 uIU/mL. Performed at [facility], [address] [place], [place] [address]   Resp Panel by RT-PCR (Flu A&B, Covid) Nasopharyngeal Swab     Status: None   Collection Time: 05/09/20  2:31 PM   Specimen: Nasopharyngeal Swab; Nasopharyngeal(NP) swabs in vial transport medium  Result Value Ref Range   SARS Coronavirus 2 by RT PCR NEGATIVE NEGATIVE    Comment: (NOTE) SARS-CoV-2 target nucleic acids are NOT DETECTED.  The SARS-CoV-2 RNA is generally detectable in upper respiratory specimens during the acute phase of infection. The lowest concentration of SARS-CoV-2 viral copies this assay can detect is 138 copies/mL. A negative result does not preclude SARS-Cov-2 infection and should not be used as the sole basis for treatment or other patient management decisions. A negative result may occur with  improper specimen collection/handling, submission of specimen other than nasopharyngeal swab, presence of viral mutation(s) within the areas targeted by this assay, and inadequate number of viral copies(<138 copies/mL). A negative result must be combined with clinical observations, patient history, and epidemiological information. The expected result is Negative.  Fact Sheet for Patients:  EntrepreneurPulse.com.au  Fact Sheet for Healthcare Providers:  IncredibleEmployment.be  This test is no t yet approved or cleared by the [place] FDA and  has been authorized for detection and/or diagnosis of SARS-CoV-2 by FDA under an Emergency Use Authorization (EUA). This EUA will  remain  in effect (meaning this test can be used) for the duration of the COVID-19 declaration under Section 564(b)(1) of the Act, 21 U.S.C.section 360bbb-3(b)(1), unless the authorization is terminated  or revoked sooner.       Influenza A by PCR NEGATIVE NEGATIVE   Influenza B by PCR NEGATIVE NEGATIVE    Comment: (NOTE) The Xpert Xpress SARS-CoV-2/FLU/RSV plus assay is intended as an aid in the diagnosis of influenza from Nasopharyngeal swab specimens and should not be used as a sole basis for treatment. Nasal washings and aspirates are unacceptable for Xpert Xpress SARS-CoV-2/FLU/RSV testing.  Fact Sheet for Patients: EntrepreneurPulse.com.au  Fact Sheet for Healthcare Providers: IncredibleEmployment.be  This test is not yet approved or cleared by the [place] FDA and has been authorized for detection and/or diagnosis of SARS-CoV-2 by FDA under an Emergency Use Authorization (EUA). This EUA will remain in effect (meaning this test can be used) for the duration of the COVID-19 declaration under Section 564(b)(1) of the Act,  21 U.S.C. section 360bbb-3(b)(1), unless the authorization is terminated or revoked.  Performed at [facility], [address] [place],  [address]    *Note: Due to a large number of results and/or encounters for the requested time period, some results have not been displayed. A complete set of results can be found in Results Review.    Blood Alcohol level:  Lab Results  Component Value Date   ETH <10 [id number]    Metabolic Disorder Labs: Lab Results  Component Value Date   HGBA1C 6.0 04/10/2012   No results found for: PROLACTIN Lab Results  Component Value Date   CHOL 174 05/11/2010   TRIG 78.0 05/11/2010   HDL 52.30 05/11/2010   CHOLHDL 3 05/11/2010   VLDL 15.6 05/11/2010   LDLCALC 106 (H) 05/11/2010   LDLCALC 105 (H) 10/14/2009    Physical Findings: AIMS: Facial and Oral  Movements Muscles of Facial Expression: None, normal Lips and Perioral Area: None, normal Jaw: None, normal Tongue: None, normal,Extremity Movements Upper (arms, wrists, hands, fingers): None, normal Lower (legs, knees, ankles, toes): None, normal, Trunk Movements Neck, shoulders, hips: None, normal, Overall Severity Severity of abnormal movements (highest score from questions above): None, normal Incapacitation due to abnormal movements: None, normal Patient's awareness of abnormal movements (rate only patient's report): No Awareness, Dental Status Current problems with teeth and/or dentures?: No Does patient usually wear dentures?: Yes (upper and lower bottom dentures)  CIWA:  CIWA-Ar Total: 2 COWS:  COWS Total Score: 3  Musculoskeletal: Strength & Muscle Tone: within normal limits Gait & Station: uses walker for assistance Patient leans: N/A  Psychiatric Specialty Exam: Physical Exam  Review of Systems  Blood pressure 129/75, pulse 61, temperature 98.2 F (36.8 C), temperature source Oral, resp. rate 18, height 5\' 4"  (1.626 m), weight 63.7 kg, SpO2 100 %.Body mass index is 24.12 kg/m.  General Appearance: Casual  Eye Contact:  Fair  Speech:  Normal Rate  Volume:  Decreased  Mood:  Dysphoric  Affect:  Congruent and Constricted  Thought Process:  Coherent and Goal Directed  Orientation:  Full (Time, Place, and Person)  Thought Content:  Logical  Suicidal Thoughts:  No  Homicidal Thoughts:  No  Memory:  Immediate;   Fair Recent;   Fair Remote;   Fair  Judgement:  Other:  limited  Insight:  Fair  Psychomotor Activity:  Normal  Concentration:  Concentration: Fair and Attention Span: Fair  Recall:  [employer] of Knowledge:  Fair  Language:  Fair  Akathisia:  No  Handed:  Right  AIMS (if indicated):     Assets:  Communication Skills Desire for Improvement Financial Resources/Insurance Social Support  ADL's:  Intact  Cognition:  WNL  Sleep:  Number of Hours: 7.45      Treatment Plan Summary: Daily contact with patient to assess and evaluate symptoms and progress in treatment and Medication management  Patient is a 87 year old female with the above-stated past psychiatric history who is seen in follow-up.  Chart reviewed. Patient discussed with nursing. Patient reports partial mood improvement and no suicidal thoughts anymore. She is very anxious regarding her discharge plan. It is unclear so far if she can stay with family (SW is trying to contact patient`s daughter) or we have to look for another placement option for her. No medication changes today. We will continue Lexapro 10mg  daily for anxiety and depression and Lorazepam 0.5mg  daily for anxiety.   Plan:  -continue inpatient psych admission; 15-minute checks; daily  contact with patient to assess and evaluate symptoms and progress in treatment; psychoeducation.  -continue scheduled medications:  . escitalopram  10 mg Oral Daily  . famotidine  10 mg Oral BID  . influenza vaccine adjuvanted  0.5 mL Intramuscular Tomorrow-1000  . levothyroxine  100 mcg Oral Q0600  . loratadine  10 mg Oral Daily  . LORazepam  0.5 mg Oral Daily  . pneumococcal 23 valent vaccine  0.5 mL Intramuscular Tomorrow-1000  . spironolactone  25 mg Oral Daily    -continue PRN medications.  acetaminophen, alum & mag hydroxide-simeth, magnesium hydroxide, polyvinyl alcohol, traZODone  -Pertinent Labs: no new labs ordered today    -Consults: No new consults placed since yesterday    -Disposition: It is unclear so far if she can stay with family (SW is trying to contact patient`s daughter) or we have to look for another placement option for her. All necessary aftercare will be arranged prior to discharge Likely d/c home with outpatient psych follow-up.  -  I certify that the patient does need, on a daily basis, active treatment furnished directly by or requiring the supervision of inpatient psychiatric facility personnel.    Larita Fife, MD 05/11/2020, 10:36 AM

## 2020-05-11 NOTE — BHH Counselor (Signed)
CSW made another attempt to contact patients collateral contact. [address]ious [address], daughter, [phone]). Unable to leave message.

## 2020-05-11 NOTE — Plan of Care (Signed)
  Problem: Coping: Goal: Coping ability will improve Outcome: Progressing   Problem: Medication: Goal: Compliance with prescribed medication regimen will improve Description: Patient complaint with prescribed medication regimen  Outcome: Progressing

## 2020-05-11 NOTE — Progress Notes (Signed)
Patient wanted this Probation officer to call her son Bland Span, in which I did, and she spoke with him regarding if she could stay with him. Bland Span stated that he doesn't believe in medication, so if patient wants to stay with him, she can't be on medication.

## 2020-05-11 NOTE — Progress Notes (Signed)
Patient stated that she wants to rest and will take her morning medication once she gets up. MD will be made aware.

## 2020-05-11 NOTE — Progress Notes (Signed)
Patient pleasant and cooperative. Denies SI, HI, AVH. Med compliant. Isolative to room. Medication given for sleep with good relief. No complaints voiced.  Encouragement and support provided. Safety checks maintained. Medications given as prescribed. Pt receptive and remains safe on unit with q15 min checks.

## 2020-05-11 NOTE — Plan of Care (Signed)
D- Patient alert and oriented. Patient presented in a pleasant mood on assessment stating that she slept good "I think I did", and had no complaints to voice to this Probation officer. Patient endorsed both depression/anxiety stating that " because of my situation and they don't know how to treat me. I'm just trying not to think about things". Patient also stated that she is worried about where she's going once she leaves here, because she can't go back to the facility that she's been at because they don't make sure she eats or gets her medication. Patient denied SI, HI, AVH, and pain at this time. Patient had no stated goals for today.  A- Scheduled medications administered to patient, per MD orders. Support and encouragement provided.  Routine safety checks conducted every 15 minutes.  Patient informed to notify staff with problems or concerns.  R- No adverse drug reactions noted. Patient contracts for safety at this time. Patient compliant with medications and treatment plan. Patient receptive, calm, and cooperative. Patient interacts well with others on the unit.  Patient remains safe at this time.  Problem: Consults Goal: Concurrent Medical Patient Education Description: (See Patient Education Module for education specifics) Outcome: Progressing   Problem: BHH Concurrent Medical Problem Goal: LTG-Pt will be physically stable and he/significant other Description: (Patient will be physically stable and he/significant other will be able to verbalize understanding of follow-up care and symptoms that would warrant further treatment) Outcome: Progressing Goal: STG-Vital signs will be within defined limits or stabilized Description: (STG- Vital signs will be within defined limits or stabilized for individual) Outcome: Progressing Goal: STG-Compliance with medication and/or treatment as ordered Description: (STG-Compliance with medication and/or treatment as ordered by MD) Outcome: Progressing Goal:  STG-Verbalize two symptoms that would warrant further Description: (STG-Verbalize two symptoms that would warrant further treatment) Outcome: Progressing Goal: STG-Patient will participate in management/stabilization Description: (STG-Patient will participate in management/stabilization of medical condition) Outcome: Progressing Goal: STG-Other (Specify): Description: STG-Other Concurrent Medical (Specify): Outcome: Progressing   Problem: Education: Goal: Ability to make informed decisions regarding treatment will improve Outcome: Progressing   Problem: Coping: Goal: Coping ability will improve Outcome: Progressing   Problem: Health Behavior/Discharge Planning: Goal: Identification of resources available to assist in meeting health care needs will improve Outcome: Progressing   Problem: Medication: Goal: Compliance with prescribed medication regimen will improve Description: Patient complaint with prescribed medication regimen  Outcome: Progressing   Problem: Self-Concept: Goal: Ability to disclose and discuss suicidal ideas will improve Outcome: Progressing Goal: Will verbalize positive feelings about self Outcome: Progressing

## 2020-05-12 DIAGNOSIS — F332 Major depressive disorder, recurrent severe without psychotic features: Secondary | ICD-10-CM | POA: Diagnosis not present

## 2020-05-12 MED ORDER — ESCITALOPRAM OXALATE 10 MG PO TABS
10.0000 mg | ORAL_TABLET | Freq: Every day | ORAL | Status: DC
  Administered 2020-05-12 – 2020-05-14 (×3): 10 mg via ORAL
  Filled 2020-05-12 (×3): qty 1

## 2020-05-12 NOTE — Progress Notes (Signed)
Patient calm and cooperative during assessment. Patient denies SI/HI/AVH. Patient endorses depression. Patient isolative to her room but did get snack. Patient compliant with medication administration per MD orders. Patient given education, support, and encouragement to be active in her treatment plan. Patient being monitored Q 15 minutes for safety per unit protocol. Pt remains safe on the unit.  

## 2020-05-12 NOTE — BHH Counselor (Signed)
CSW was contacted by [facility] related to patient's interest in referral. CSW attempted to contact [facility], Patient's daughter, as collateral. She was not reached, voicemail was not reached. CSW spoke with patient's son who reported that the [name] (daughter) received a call from [facility]. He reports that they are not sure why they had their information and that they were unable to afford the cost of the facility (estimated $5000 Lorel [place]). He reports that she can return to [facility] where she previously stayed; patient expressed an disinterest in returning to this facility. CSW reiterated to him that the patient is her own guardian and that they would respect her expressed preferences. He acknowledged, end of call. No patient information was shared a this time.   Signed:  Durenda Hurt, MSW, [place], LCASA 05/12/2020 4:03 PM

## 2020-05-12 NOTE — BHH Group Notes (Signed)
Group was not held due to inclement weather.  CSW team worked from home.  Assunta Curtis, MSW, LCSW 05/12/2020 4:52 PM

## 2020-05-12 NOTE — Plan of Care (Signed)
D- Patient alert and oriented. Patient presented in a sad, but pleasant mood on assessment stating that she couldn't sleep last night. When this writer asked patient about depression and anxiety, she stated "oh you don't even know what depression is. [name] knows what depression is. Nobody in my family is going to do anything for me". Patient also reported feeling anxious, in which she was given her scheduled Ativan, and she was thankful for receiving it at this time. Patient denied SI, HI, AVH, and pain at this time. Patient had no stated goals for today, although she is very concerned with where she will be going once she leaves here. Patient is adamant on not returning to the facility she was at before being brought here, reporting that the staff did not give her her medications, nor did they make sure that she was eating.  A- Scheduled medications administered to patient, per MD orders. Support and encouragement provided.  Routine safety checks conducted every 15 minutes.  Patient informed to notify staff with problems or concerns.  R- No adverse drug reactions noted. Patient contracts for safety at this time. Patient compliant with medications and treatment plan. Patient receptive, calm, and cooperative. Patient interacts well with others on the unit.  Patient remains safe at this time.  Problem: Consults Goal: Concurrent Medical Patient Education Description: (See Patient Education Module for education specifics) Outcome: Not Progressing   Problem: [facility] Concurrent Medical Problem Goal: LTG-Pt will be physically stable and he/significant other Description: (Patient will be physically stable and he/significant other will be able to verbalize understanding of follow-up care and symptoms that would warrant further treatment) Outcome: Not Progressing Goal: STG-Vital signs will be within defined limits or stabilized Description: (STG- Vital signs will be within defined limits or stabilized for  individual) Outcome: Not Progressing Goal: STG-Compliance with medication and/or treatment as ordered Description: (STG-Compliance with medication and/or treatment as ordered by MD) Outcome: Not Progressing Goal: STG-Verbalize two symptoms that would warrant further Description: (STG-Verbalize two symptoms that would warrant further treatment) Outcome: Not Progressing Goal: STG-Patient will participate in management/stabilization Description: (STG-Patient will participate in management/stabilization of medical condition) Outcome: Not Progressing Goal: STG-Other (Specify): Description: STG-Other Concurrent Medical (Specify): Outcome: Not Progressing   Problem: Education: Goal: Ability to make informed decisions regarding treatment will improve Outcome: Not Progressing   Problem: Coping: Goal: Coping ability will improve Outcome: Not Progressing   Problem: Health Behavior/Discharge Planning: Goal: Identification of resources available to assist in meeting health care needs will improve Outcome: Not Progressing   Problem: Medication: Goal: Compliance with prescribed medication regimen will improve Description: Patient complaint with prescribed medication regimen  Outcome: Not Progressing   Problem: Self-Concept: Goal: Ability to disclose and discuss suicidal ideas will improve Outcome: Not Progressing Goal: Will verbalize positive feelings about self Outcome: Not Progressing

## 2020-05-12 NOTE — Plan of Care (Signed)
  Problem: Consults Goal: Concurrent Medical Patient Education Description: (See Patient Education Module for education specifics) Outcome: Progressing   Problem: [facility] Concurrent Medical Problem Goal: LTG-Pt will be physically stable and he/significant other Description: (Patient will be physically stable and he/significant other will be able to verbalize understanding of follow-up care and symptoms that would warrant further treatment) Outcome: Progressing Goal: STG-Vital signs will be within defined limits or stabilized Description: (STG- Vital signs will be within defined limits or stabilized for individual) Outcome: Progressing Goal: STG-Compliance with medication and/or treatment as ordered Description: (STG-Compliance with medication and/or treatment as ordered by MD) Outcome: Progressing Goal: STG-Verbalize two symptoms that would warrant further Description: (STG-Verbalize two symptoms that would warrant further treatment) Outcome: Progressing Goal: STG-Patient will participate in management/stabilization Description: (STG-Patient will participate in management/stabilization of medical condition) Outcome: Progressing Goal: STG-Other (Specify): Description: STG-Other Concurrent Medical (Specify): Outcome: Progressing   Problem: Education: Goal: Ability to make informed decisions regarding treatment will improve Outcome: Progressing   Problem: Coping: Goal: Coping ability will improve Outcome: Progressing   Problem: Health Behavior/Discharge Planning: Goal: Identification of resources available to assist in meeting health care needs will improve Outcome: Progressing   Problem: Medication: Goal: Compliance with prescribed medication regimen will improve Description: Patient complaint with prescribed medication regimen  Outcome: Progressing   Problem: Self-Concept: Goal: Ability to disclose and discuss suicidal ideas will improve Outcome: Progressing Goal: Will  verbalize positive feelings about self Outcome: Progressing

## 2020-05-12 NOTE — Progress Notes (Signed)
Patient states that she likes to rest in the mornings, and did not get up for breakfast. Patient stated that she will take her medication once she gets up. MD has been notified.

## 2020-05-12 NOTE — Progress Notes (Signed)
Patient is pleasant and easy to engage. She is med complaint this evening and tolerated her medication without incident. Her main compliant was to have medication to aid in sleep and was pleased with the Trazodone provided. She denies SI HI AVH depression anxiety and pain at this encounter. She is monitored every 15 minutes for safety and encouraged to contact staff with any concerns.     Cleo Butler-Nicholson, LPN

## 2020-05-12 NOTE — Plan of Care (Signed)
Patient compliant with medication administration per MD orders   Problem: Medication: Goal: Compliance with prescribed medication regimen will improve Description: Patient complaint with prescribed medication regimen  Outcome: Progressing

## 2020-05-12 NOTE — Progress Notes (Signed)
Patient refused her evening Pepcid, stating that "it doesn't really help". She also asked for two more blankets because she is always cold, because of her Thyroid issues. This Probation officer provided two more blankets for her.

## 2020-05-12 NOTE — Progress Notes (Signed)
[facility] MD Progress Note  05/12/2020 10:57 AM [name]  MRN:  [id number]  Principal Problem: <principal problem not specified> Diagnosis: Active Problems:   Moderately severe recurrent major depression ([place])  Total Time spent with patient: 77 minutes  87 year old female with the history of depression and anxiety, who was admitted due to worsened depression, anxiety and suicidal thoughts secondary not taking her psychotropic medications for about three weeks.  Interval History Patient was seen today for re-evaluation.  Nursing reports no events overnight. The patient requires some assistance to shower, but performs her other ADLs on her own.  Patient has been medication compliant.    Subjective:  On assessment patient reports feeling sad due to not having any where to go. She feels she needs her medications, and is thus unable to stay with [name]. She notes that she does not wish to return to her previous ALF because she felt they were unwilling to help her in the mornings, or give her medications. She requests we search for alternative ALF. Today she denies suicidal ideations, homicidal ideations, visual hallucinations, and auditory hallucinations. She feels her depression is  improving with medications. She reports sleeping well overnight. She remains highly anxious over disposition.   Current suicidal/homicidal ideations: Denies Current auditory/visual hallucinations: Denies The patient reports no side effects from medications.    Labs: no new results for review.     Past Psychiatric History:  Past Dx: chronic anxiety and depression. Psychiatrist: Dr. Dossie Der in [facility]  Outpatient psych meds: 0.5 mg of lorazepam and 10 mg of Lexapro chronically.  Patient denies h/o past suicidal attempts. Patient reports she has had one prior psychiatric hospital in [place] years ago for depression and SI.   Past Medical History:  Past Medical History:  Diagnosis Date  . Anxiety   .  Bradycardia   . Breast cancer, left breast ([place]) 1995  . Breast cancer, right breast ([place]) 2002  . CHF (congestive heart failure) ([place])   . Depression    Dr Cheryln Manly  . Discoid lupus    skin  . Full dentures   . GERD (gastroesophageal reflux disease)   . Glaucoma   . Gout   . Hx of echocardiogram    a.  Echocardiogram (03/2011): Mild LVH, EF 0000000, grade 1 diastolic dysfunction, mild MR, mild to moderate TR, PASP 42;  b. Echo (05/2013):  Mod LVH, EF 65-70%, no WMA, Gr 1 DD, mild MR, mod TR, PASP 39  . Hx of exercise stress test    a. ETT-echocardiogram (06/08/11): Sub-optimal exercise. Test stopped early due to dizziness and hypotension. EF 60%.  Non-diagnostic.;   b. Lexiscan Myoview (06/2013):  Diaphragmatic attenuation, no ischemia, EF 61%.  Low Risk  . Hypertension   . Hypothyroidism   . IBS (irritable bowel syndrome)   . Incontinence   . OSA (obstructive sleep apnea) 07/20/2016   Mild with AHI 12/hr now on CPAP  . Osteoarthritis   . Osteopenia   . Pacemaker 2009   Brady//Chronotropic incompetence with normal pacemaker function  . Thyroid disease   . Wears hearing aid    both ears    Past Surgical History:  Procedure Laterality Date  . BREAST BIOPSY Left 1995  . BREAST LUMPECTOMY Left 1995   axillary node dissectoin  . CARDIAC CATHETERIZATION N/A 02/13/2016   Procedure: Right Heart Cath;  Surgeon: Larey Dresser, MD;  Location: [place] [facility];  Service: Cardiovascular;  Laterality: N/A;  . CATARACT EXTRACTION W/ INTRAOCULAR LENS  IMPLANT,  BILATERAL Bilateral 06/2009 - 12.2915   left - right  . DORSAL COMPARTMENT RELEASE  2000   left  . HAMMER TOE SURGERY Left 2004  . HEMORRHOID BANDING    . INSERT / REPLACE / REMOVE PACEMAKER  2009  . KNEE ARTHROSCOPY Left 08/29/2012   Procedure: LEFT KNEE ARTHROSCOPY ;  Surgeon: Hessie Dibble, MD;  Location: [facility];  Service: Orthopedics;  Laterality: Left;  [name] MASTECTOMY Bilateral 09/2000   nbx  . PPM  GENERATOR CHANGEOUT N/A 07/16/2019   Procedure: PPM GENERATOR CHANGEOUT;  Surgeon: Deboraha Sprang, MD;  Location: [place] [facility];  Service: Cardiovascular;  Laterality: N/A;  . TRIGGER FINGER RELEASE Right 07/31/2013   Procedure: EXCISE MASS RIGHT INDEX A-1 [place]LEY RELEASE A-1 RIGHT INDEX;  Surgeon: Cammie Sickle., MD;  Location: [place];  Service: Orthopedics;  Laterality: Right;  [name] VAGINAL HYSTERECTOMY     Family History:  Family History  Problem Relation Age of Onset  . Ovarian cancer Mother   . Heart disease Father   . Stroke Sister   . Stroke Brother   . Cancer Brother   . Heart disease Brother    Family Psychiatric  History: Unknown  Social History:  Social History   Substance and Sexual Activity  Alcohol Use No  . Alcohol/week: 0.0 standard drinks     Social History   Substance and Sexual Activity  Drug Use No    Social History   Socioeconomic History  . Marital status: Widowed    Spouse name: Not on file  . Number of children: 3  . Years of education: 82  . Highest education level: Not on file  Occupational History  . Occupation: retired  Tobacco Use  . Smoking status: Never Smoker  . Smokeless tobacco: Never Used  Vaping Use  . Vaping Use: Never used  Substance and Sexual Activity  . Alcohol use: No    Alcohol/week: 0.0 standard drinks  . Drug use: No  . Sexual activity: Not Currently  Other Topics Concern  . Not on file  Social History Narrative   She is a vegan now 2010.     [facility].     Retired from Orthoptist.    Education: college.     3 children.    Social Determinants of Health   Financial Resource Strain: Not on file  Food Insecurity: Not on file  Transportation Needs: Not on file  Physical Activity: Not on file  Stress: Not on file  Social Connections: Not on file   Additional Social History:                         Sleep: Fair  Appetite:  Fair  Current  Medications: Current Facility-Administered Medications  Medication Dose Route Frequency Provider Last Rate Last Admin  . acetaminophen (TYLENOL) tablet 650 mg  650 mg Oral Q6H PRN [name], [name], MD      . alum & mag hydroxide-simeth (MAALOX/MYLANTA) 200-200-20 MG/5ML suspension 30 mL  30 mL Oral Q4H PRN [name], [name], MD      . escitalopram (LEXAPRO) tablet 10 mg  10 mg Oral Daily [name], Madie Reno, MD   10 mg at 05/11/20 1217  . famotidine (PEPCID) tablet 10 mg  10 mg Oral BID [name], Madie Reno, MD   10 mg at 05/11/20 1722  . influenza vaccine adjuvanted (FLUAD) injection 0.5 mL  0.5 mL  Intramuscular Tomorrow-1000 Salley Scarlet, MD      . levothyroxine (SYNTHROID) tablet 100 mcg  100 mcg Oral Q0600 Gonzella Lex, MD   100 mcg at 05/12/20 [id number]  . loratadine (CLARITIN) tablet 10 mg  10 mg Oral Daily [name], Madie Reno, MD   10 mg at 05/11/20 1217  . LORazepam (ATIVAN) tablet 0.5 mg  0.5 mg Oral Daily [name], [name], MD   0.5 mg at 05/11/20 1217  . magnesium hydroxide (MILK OF MAGNESIA) suspension 30 mL  30 mL Oral Daily PRN [name], [name], MD      . pneumococcal 23 valent vaccine (PNEUMOVAX-23) injection 0.5 mL  0.5 mL Intramuscular Tomorrow-1000 Salley Scarlet, MD      . polyethylene glycol (MIRALAX / GLYCOLAX) packet 17 g  17 g Oral Daily PRN Larita Fife, MD      . polyvinyl alcohol (LIQUIFILM TEARS) 1.4 % ophthalmic solution 1 drop  1 drop Both Eyes PRN [name], [name], MD      . spironolactone (ALDACTONE) tablet 25 mg  25 mg Oral Daily [name], Madie Reno, MD   25 mg at 05/11/20 1217  . traZODone (DESYREL) tablet 100 mg  100 mg Oral QHS PRN [name], Madie Reno, MD   100 mg at 05/11/20 2200  . white petrolatum (VASELINE) gel   Topical PRN Larita Fife, MD        Lab Results:  No results found. However, due to the size of the patient record, not all encounters were searched. Please check Results Review for a complete set of results.  Blood Alcohol level:  Lab Results  Component Value Date    ETH <10 [id number]    Metabolic Disorder Labs: Lab Results  Component Value Date   HGBA1C 6.0 04/10/2012   No results found for: PROLACTIN Lab Results  Component Value Date   CHOL 174 05/11/2010   TRIG 78.0 05/11/2010   HDL 52.30 05/11/2010   CHOLHDL 3 05/11/2010   VLDL 15.6 05/11/2010   LDLCALC 106 (H) 05/11/2010   LDLCALC 105 (H) 10/14/2009    Physical Findings: AIMS: Facial and Oral Movements Muscles of Facial Expression: None, normal Lips and Perioral Area: None, normal Jaw: None, normal Tongue: None, normal,Extremity Movements Upper (arms, wrists, hands, fingers): None, normal Lower (legs, knees, ankles, toes): None, normal, Trunk Movements Neck, shoulders, hips: None, normal, Overall Severity Severity of abnormal movements (highest score from questions above): None, normal Incapacitation due to abnormal movements: None, normal Patient's awareness of abnormal movements (rate only patient's report): No Awareness, Dental Status Current problems with teeth and/or dentures?: No Does patient usually wear dentures?: Yes (upper and lower bottom dentures)  CIWA:  CIWA-Ar Total: 2 COWS:  COWS Total Score: 3  Musculoskeletal: Strength & Muscle Tone: within normal limits Gait & Station: uses walker for assistance Patient leans: N/A  Psychiatric Specialty Exam: Physical Exam Vitals and nursing note reviewed.  Constitutional:      Appearance: Normal appearance.  HENT:     Head: Normocephalic and atraumatic.     Right Ear: External ear normal.     Left Ear: External ear normal.     Nose: Nose normal.     Mouth/Throat:     Mouth: Mucous membranes are moist.     Pharynx: Oropharynx is clear.  Eyes:     Extraocular Movements: Extraocular movements intact.     Conjunctiva/sclera: Conjunctivae normal.     Pupils: Pupils are equal, round, and reactive to light.  Cardiovascular:  Rate and Rhythm: Normal rate.     Pulses: Normal pulses.  Pulmonary:     Effort:  Pulmonary effort is normal.     Breath sounds: Normal breath sounds.  Abdominal:     General: Abdomen is flat.     Palpations: Abdomen is soft.  Musculoskeletal:        General: No swelling. Normal range of motion.     Cervical back: Normal range of motion and neck supple.  Skin:    General: Skin is warm and dry.  Neurological:     General: No focal deficit present.     Mental Status: She is alert and oriented to person, place, and time.  Psychiatric:        Attention and Perception: Attention and perception normal.        Mood and Affect: Mood is anxious and depressed.        Speech: Speech normal.        Behavior: Behavior is cooperative.        Thought Content: Thought content does not include homicidal or suicidal ideation.        Cognition and Memory: Cognition and memory normal.        Judgment: Judgment normal.     Review of Systems  Constitutional: Positive for fatigue. Negative for appetite change.  HENT: Negative for rhinorrhea and sore throat.   Eyes: Negative for photophobia and visual disturbance.  Respiratory: Negative for cough and shortness of breath.   Cardiovascular: Negative for chest pain and palpitations.  Gastrointestinal: Negative for constipation, diarrhea, nausea and vomiting.  Endocrine: Negative for cold intolerance and heat intolerance.  Genitourinary: Negative for difficulty urinating and dysuria.  Musculoskeletal: Negative for arthralgias and myalgias.  Skin: Negative for rash and wound.  Allergic/Immunologic: Positive for environmental allergies and food allergies.  Neurological: Negative for dizziness and headaches.  Hematological: Negative for adenopathy. Does not bruise/bleed easily.  Psychiatric/Behavioral: Positive for dysphoric mood. Negative for suicidal ideas. The patient is nervous/anxious.     Blood pressure 127/80, pulse 63, temperature 98.1 F (36.7 C), temperature source Oral, resp. rate 14, height 5\' 4"  (1.626 m), weight 63.7 kg,  SpO2 100 %.Body mass index is 24.12 kg/m.  General Appearance: Casual  Eye Contact:  Fair  Speech:  Normal Rate  Volume:  Decreased  Mood:  Dysphoric  Affect:  Congruent and Constricted  Thought Process:  Coherent and Goal Directed  Orientation:  Full (Time, Place, and Person)  Thought Content:  Logical  Suicidal Thoughts:  No  Homicidal Thoughts:  No  Memory:  Immediate;   Fair Recent;   Fair Remote;   Fair  Judgement:  Other:  limited  Insight:  Fair  Psychomotor Activity:  Normal  Concentration:  Concentration: Fair and Attention [name]: Fair  Recall:  [employer] of Knowledge:  Fair  Language:  Fair  Akathisia:  No  Handed:  Right  AIMS (if indicated):     Assets:  Communication Skills Desire for Improvement Financial Resources/Insurance Social Support  ADL's:  Intact  Cognition:  WNL  Sleep:  Number of Hours: 7.45     Treatment Plan Summary: Daily contact with patient to assess and evaluate symptoms and progress in treatment and Medication management  Patient is a 87 year old female with the above-stated past psychiatric history who is seen in follow-up.  Chart reviewed. Patient discussed with nursing. Patient reports partial mood improvement and no suicidal thoughts anymore. She is very anxious regarding her discharge plan. At this  time, it appears living with her family is not an option. Looking into alternative ALF placement options with social work. We will continue Lexapro 10mg  daily for anxiety and depression and Lorazepam 0.5mg  daily for anxiety.   Plan:  -continue inpatient psych admission; 15-minute checks; daily contact with patient to assess and evaluate symptoms and progress in treatment; psychoeducation.  -continue scheduled medications:  . escitalopram  10 mg Oral Daily  . famotidine  10 mg Oral BID  . influenza vaccine adjuvanted  0.5 mL Intramuscular Tomorrow-1000  . levothyroxine  100 mcg Oral Q0600  . loratadine  10 mg Oral Daily  . LORazepam  0.5  mg Oral Daily  . pneumococcal 23 valent vaccine  0.5 mL Intramuscular Tomorrow-1000  . spironolactone  25 mg Oral Daily    -continue PRN medications.  acetaminophen, alum & mag hydroxide-simeth, magnesium hydroxide, polyethylene glycol, polyvinyl alcohol, traZODone, white petrolatum  -Pertinent Labs: no new labs ordered today    -Consults: No new consults placed since yesterday    -Disposition: It is unclear so far if she can stay with family (SW is trying to contact patient`s daughter) or we have to look for another placement option for her. All necessary aftercare will be arranged prior to discharge Likely d/c home with outpatient psych follow-up.  -  I certify that the patient does need, on a daily basis, active treatment furnished directly by or requiring the supervision of inpatient psychiatric facility personnel.   Salley Scarlet, MD 05/12/2020, 10:57 AM

## 2020-05-12 NOTE — NC FL2 (Signed)
[place] LEVEL OF CARE SCREENING TOOL     IDENTIFICATION  Patient Name: [name] Birthdate: Nov 05, 1929 Sex: female Admission Date (Current Location): 05/09/2020  [facility] and [place] Number:  Herbalist and Address:  [facility], [address] [place], [place] [address]      Provider Number: [id number]  Attending Physician Name and Address:  Salley Scarlet, MD  Relative Name and Phone Number:  [name], Daughter, [phone]    Current Level of Care: Hospital Recommended Level of Care: [place] Prior Approval Number:    Date Approved/Denied:   PASRR Number:    Discharge Plan: Other (Comment) (Assisted Living)    Current Diagnoses: Patient Active Problem List   Diagnosis Date Noted  . Moderate recurrent major depression ([place]) 05/09/2020  . Generalized anxiety disorder 05/09/2020  . Moderately severe recurrent major depression ([place]) 05/09/2020  . Weight loss 10/09/2019  . DVT (deep venous thrombosis) ([place]) 03/29/2018  . PE (pulmonary thromboembolism) ([place]) 02/03/2018  . Macrocytosis 12/21/2017  . Wart 12/21/2017  . Sleep apnea 07/20/2016  . Sigmoid diverticulitis 07/01/2016  . Acute diverticulitis   . Rectal bleeding 06/30/2016  . Impacted cerumen of right ear 03/26/2016  . Syncope 03/22/2016  . Benign essential HTN 03/22/2016  . CAD (coronary artery disease) 03/22/2016  . CKD (chronic kidney disease) stage 3, GFR 30-59 ml/min (HCC) 03/22/2016  . Depression 03/22/2016  . Exertional dyspnea 11/04/2015  . Ascending aortic aneurysm ([place]) 11/04/2015  . 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 Arthralgia 10/10/2015  . Pulmonary hypertension ([place]) 09/30/2015  . Mild persistent asthma 09/30/2015  . Irritable larynx 09/04/2015  . Rotator cuff disorder 03/06/2015  . OCD (obsessive compulsive disorder) 04/03/2013  . IBS (irritable bowel syndrome) 07/03/2012  . Dysphagia 04/13/2012  . Denture irritation  12/20/2011  . Irritable bladder 12/20/2011  . Knee pain, bilateral 12/20/2011  . Hearing loss 10/11/2011  . Pacemaker-Medtronic 05/18/2011  . DOE (dyspnea on exertion) 04/13/2011  . Gout, unspecified 04/30/2010  . Anal fissure 02/03/2010  . Atrial fibrillation ([place]) 08/26/2009  . Sinoatrial node dysfunction ([place]) 09/17/2008  . HYPOTENSION, ORTHOSTATIC 01/29/2008  . Microscopic hematuria 08/21/2007  . Pain in joint, pelvic region and thigh 08/01/2007  . Vitamin D deficiency 05/23/2007  . Anxiety disorder 05/23/2007  . Hypothyroidism 03/22/2007  . GERD 03/22/2007  . Osteoarthritis 03/22/2007  . CFS (chronic fatigue syndrome) 11/17/2006  . Hypertensive heart disease 11/17/2006  . ALLERGIC RHINITIS 11/17/2006  . OSTEOPOROSIS 11/17/2006  . BREAST CANCER, HX OF 11/17/2006    Orientation RESPIRATION BLADDER Height & Weight     Self,Time,Situation,Place  Normal Continent (incontinent overnight) Weight: 140 lb 8 oz (63.7 kg) Height:  5\' 4"  (162.6 cm)  BEHAVIORAL SYMPTOMS/MOOD NEUROLOGICAL BOWEL NUTRITION STATUS   (WNL)  (WNL) Continent (incontinent overnight)  (None noted)  AMBULATORY STATUS COMMUNICATION OF NEEDS Skin   Independent (Ambulates w/ cane) Verbally Normal                       Personal Care Assistance Level of Assistance  Bathing (Medication Assistance) Bathing Assistance: Maximum assistance   Dressing Assistance: Independent     Functional Limitations Info   (WNL)          SPECIAL CARE FACTORS FREQUENCY                       Contractures Contractures Info: Not present    Additional Factors Info  Allergies,Psychotropic  Allergies Info: Lactase; Lactose; Amlodipine Besy-benazepril Hcl; Amlodipine Besylate; Clonidine Hydrochloride; Valsartan; Verapamil Psychotropic Info: LORazepam (ATIVAN) tablet 0.5 mg; escitalopram (LEXAPRO) tablet 10 mg;         Current Medications (05/12/2020):  This is the current hospital active medication  list Current Facility-Administered Medications  Medication Dose Route Frequency Provider Last Rate Last Admin  . acetaminophen (TYLENOL) tablet 650 mg  650 mg Oral Q6H PRN [name], [name], MD      . alum & mag hydroxide-simeth (MAALOX/MYLANTA) 200-200-20 MG/5ML suspension 30 mL  30 mL Oral Q4H PRN [name], [name], MD      . escitalopram (LEXAPRO) tablet 10 mg  10 mg Oral Daily [name], Madie Reno, MD   10 mg at 05/11/20 1217  . famotidine (PEPCID) tablet 10 mg  10 mg Oral BID [name], Madie Reno, MD   10 mg at 05/11/20 1722  . influenza vaccine adjuvanted (FLUAD) injection 0.5 mL  0.5 mL Intramuscular Tomorrow-1000 Salley Scarlet, MD      . levothyroxine (SYNTHROID) tablet 100 mcg  100 mcg Oral Q0600 [name], Madie Reno, MD   100 mcg at 05/12/20 0609  . loratadine (CLARITIN) tablet 10 mg  10 mg Oral Daily [name], Madie Reno, MD   10 mg at 05/11/20 1217  . LORazepam (ATIVAN) tablet 0.5 mg  0.5 mg Oral Daily [name], [name], MD   0.5 mg at 05/11/20 1217  . magnesium hydroxide (MILK OF MAGNESIA) suspension 30 mL  30 mL Oral Daily PRN [name], [name], MD      . pneumococcal 23 valent vaccine (PNEUMOVAX-23) injection 0.5 mL  0.5 mL Intramuscular Tomorrow-1000 Salley Scarlet, MD      . polyethylene glycol (MIRALAX / GLYCOLAX) packet 17 g  17 g Oral Daily PRN Larita Fife, MD      . polyvinyl alcohol (LIQUIFILM TEARS) 1.4 % ophthalmic solution 1 drop  1 drop Both Eyes PRN [name], [name], MD      . spironolactone (ALDACTONE) tablet 25 mg  25 mg Oral Daily [name], Madie Reno, MD   25 mg at 05/11/20 1217  . traZODone (DESYREL) tablet 100 mg  100 mg Oral QHS PRN [name], Madie Reno, MD   100 mg at 05/11/20 2200  . white petrolatum (VASELINE) gel   Topical PRN Larita Fife, MD         Discharge Medications: Please see discharge summary for a list of discharge medications.  Relevant Imaging Results:  Relevant Lab Results:   Additional Information    Durenda Hurt, LCSWA

## 2020-05-12 NOTE — Progress Notes (Signed)
Patient was given her coat from her belongings today, per MD request, and she was also given her hat on 05/10/2020. Patient had complaints of being cold, her temperature was adjusted in her room as well. Patient was also provided with some adult diapers, being that patient states she sometimes has issues with incontinence at night.

## 2020-05-13 DIAGNOSIS — F332 Major depressive disorder, recurrent severe without psychotic features: Secondary | ICD-10-CM | POA: Diagnosis not present

## 2020-05-13 NOTE — Progress Notes (Signed)
Patient calm and cooperative during assessment. Patient denies SI/HI/AVH. Patient endorses depression. Patient isolative to her room but did get snack. Patient compliant with medication administration per MD orders. Patient given education, support, and encouragement to be active in her treatment plan. Patient being monitored Q 15 minutes for safety per unit protocol. Pt remains safe on the unit.

## 2020-05-13 NOTE — [facility] Notes (Signed)

## 2020-05-13 NOTE — Progress Notes (Signed)
[facility] MD Progress Note  05/13/2020 12:08 PM [name]  MRN:  [id number]  Principal Problem: Moderately severe recurrent major depression ([place]) Diagnosis: Principal Problem:   Moderately severe recurrent major depression ([place]) Active Problems:   Hypothyroidism   Anxiety disorder   Hypertensive heart disease   OCD (obsessive compulsive disorder)  Total Time spent with patient: 71 minutes  87 year old female with the history of depression and anxiety, who was admitted due to worsened depression, anxiety and suicidal thoughts secondary not taking her psychotropic medications for about three weeks.  Interval History Patient was seen today for re-evaluation.  Nursing reports no events overnight. The patient requires some assistance to shower, but performs her other ADLs on her own.  Patient has been medication compliant.    Subjective:  On assessment patient reports feeling sad due to not having any where to go.She notes that she does not wish to return to her previous ALF because she felt they were unwilling to help her get food or give her medications. She requests we search for alternative ALF. Today she denies suicidal ideations, homicidal ideations, visual hallucinations, and auditory hallucinations. She feels her depression is  improving with medications. She reports sleeping well overnight. She remains highly anxious over disposition.   Collateral from daughter, [name], [phone]. She feels that her mother just said that to get attention. Her daughter notes that no one in family can afford to assist with payment for ALF. Per daughter, this is the only ALF she can afford. Daughter confirms SSI is $2800. She notes [facility] is $5000 a month, and she cannot afford. Daughter states she is allowed to go to [facility]. Notes her mother's main concern is getting her medications, and daughter states she would pick up from pharmacy and deliver if needed.   Current suicidal/homicidal ideations:  Denies Current auditory/visual hallucinations: Denies The patient reports no side effects from medications.    Labs: no new results for review.   Past Psychiatric History:  Past Dx: chronic anxiety and depression. Psychiatrist: Dr. Dossie Der in [facility]  Outpatient psych meds: 0.5 mg of lorazepam and 10 mg of Lexapro chronically.  Patient denies h/o past suicidal attempts. Patient reports she has had one prior psychiatric hospital in [place] years ago for depression and SI.   Past Medical History:  Past Medical History:  Diagnosis Date  . Anxiety   . Bradycardia   . Breast cancer, left breast ([place]) 1995  . Breast cancer, right breast ([place]) 2002  . CHF (congestive heart failure) ([place])   . Depression    Dr Cheryln Manly  . Discoid lupus    skin  . Full dentures   . GERD (gastroesophageal reflux disease)   . Glaucoma   . Gout   . Hx of echocardiogram    a.  Echocardiogram (03/2011): Mild LVH, EF 0000000, grade 1 diastolic dysfunction, mild MR, mild to moderate TR, PASP 42;  b. Echo (05/2013):  Mod LVH, EF 65-70%, no WMA, Gr 1 DD, mild MR, mod TR, PASP 39  . Hx of exercise stress test    a. ETT-echocardiogram (06/08/11): Sub-optimal exercise. Test stopped early due to dizziness and hypotension. EF 60%.  Non-diagnostic.;   b. Lexiscan Myoview (06/2013):  Diaphragmatic attenuation, no ischemia, EF 61%.  Low Risk  . Hypertension   . Hypothyroidism   . IBS (irritable bowel syndrome)   . Incontinence   . OSA (obstructive sleep apnea) 07/20/2016   Mild with AHI 12/hr now on CPAP  . Osteoarthritis   .  Osteopenia   . Pacemaker 2009   Brady//Chronotropic incompetence with normal pacemaker function  . Thyroid disease   . Wears hearing aid    both ears    Past Surgical History:  Procedure Laterality Date  . BREAST BIOPSY Left 1995  . BREAST LUMPECTOMY Left 1995   axillary node dissectoin  . CARDIAC CATHETERIZATION N/A 02/13/2016   Procedure: Right Heart Cath;  Surgeon: Larey Dresser,  MD;  Location: [facility];  Service: Cardiovascular;  Laterality: N/A;  . CATARACT EXTRACTION W/ INTRAOCULAR LENS  IMPLANT, BILATERAL Bilateral 06/2009 - 12.2915   left - right  . DORSAL COMPARTMENT RELEASE  2000   left  . HAMMER TOE SURGERY Left 2004  . HEMORRHOID BANDING    . INSERT / REPLACE / REMOVE PACEMAKER  2009  . KNEE ARTHROSCOPY Left 08/29/2012   Procedure: LEFT KNEE ARTHROSCOPY ;  Surgeon: Hessie Dibble, MD;  Location: [facility];  Service: Orthopedics;  Laterality: Left;  [name] MASTECTOMY Bilateral 09/2000   nbx  . PPM GENERATOR CHANGEOUT N/A 07/16/2019   Procedure: PPM GENERATOR CHANGEOUT;  Surgeon: Deboraha Sprang, MD;  Location: [facility];  Service: Cardiovascular;  Laterality: N/A;  . TRIGGER FINGER RELEASE Right 07/31/2013   Procedure: EXCISE MASS RIGHT INDEX A-1 PLLLEY RELEASE A-1 RIGHT INDEX;  Surgeon: Cammie Sickle., MD;  Location: [facility];  Service: Orthopedics;  Laterality: Right;  [name] VAGINAL HYSTERECTOMY     Family History:  Family History  Problem Relation Age of Onset  . Ovarian cancer Mother   . Heart disease Father   . Stroke Sister   . Stroke Brother   . Cancer Brother   . Heart disease Brother    Family Psychiatric  History: Unknown  Social History:  Social History   Substance and Sexual Activity  Alcohol Use No  . Alcohol/week: 0.0 standard drinks     Social History   Substance and Sexual Activity  Drug Use No    Social History   Socioeconomic History  . Marital status: Widowed    Spouse name: Not on file  . Number of children: 3  . Years of education: 42  . Highest education level: Not on file  Occupational History  . Occupation: retired  Tobacco Use  . Smoking status: Never Smoker  . Smokeless tobacco: Never Used  Vaping Use  . Vaping Use: Never used  Substance and Sexual Activity  . Alcohol use: No    Alcohol/week: 0.0 standard drinks  . Drug use: No  . Sexual activity: Not  Currently  Other Topics Concern  . Not on file  Social History Narrative   She is a vegan now 2010.     [facility] at [facility].     Retired from Orthoptist.    Education: college.     3 children.    Social Determinants of Health   Financial Resource Strain: Not on file  Food Insecurity: Not on file  Transportation Needs: Not on file  Physical Activity: Not on file  Stress: Not on file  Social Connections: Not on file   Additional Social History:                         Sleep: Fair  Appetite:  Fair  Current Medications: Current Facility-Administered Medications  Medication Dose Route Frequency Provider Last Rate Last Admin  . acetaminophen (TYLENOL) tablet 650 mg  650 mg  Oral Q6H PRN [name], Madie Reno, MD      . alum & mag hydroxide-simeth (MAALOX/MYLANTA) 200-200-20 MG/5ML suspension 30 mL  30 mL Oral Q4H PRN [name], [name], MD      . escitalopram (LEXAPRO) tablet 10 mg  10 mg Oral QHS Salley Scarlet, MD   10 mg at 05/12/20 2100  . famotidine (PEPCID) tablet 10 mg  10 mg Oral BID [name], Madie Reno, MD   10 mg at 05/13/20 0805  . influenza vaccine adjuvanted (FLUAD) injection 0.5 mL  0.5 mL Intramuscular Tomorrow-1000 Salley Scarlet, MD      . levothyroxine (SYNTHROID) tablet 100 mcg  100 mcg Oral Q0600 [name], Madie Reno, MD   100 mcg at 05/13/20 [phone]  . loratadine (CLARITIN) tablet 10 mg  10 mg Oral Daily [name], Madie Reno, MD   10 mg at 05/13/20 0805  . LORazepam (ATIVAN) tablet 0.5 mg  0.5 mg Oral Daily [name], [name], MD   0.5 mg at 05/13/20 0805  . magnesium hydroxide (MILK OF MAGNESIA) suspension 30 mL  30 mL Oral Daily PRN [name], [name], MD      . pneumococcal 23 valent vaccine (PNEUMOVAX-23) injection 0.5 mL  0.5 mL Intramuscular Tomorrow-1000 Selina Cooley M, MD      . polyethylene glycol (MIRALAX / GLYCOLAX) packet 17 g  17 g Oral Daily PRN Larita Fife, MD   17 g at 05/12/20 1236  . polyvinyl alcohol (LIQUIFILM TEARS) 1.4 % ophthalmic solution 1 drop   1 drop Both Eyes PRN [name], [name], MD      . spironolactone (ALDACTONE) tablet 25 mg  25 mg Oral Daily [name], Madie Reno, MD   25 mg at 05/13/20 0805  . traZODone (DESYREL) tablet 100 mg  100 mg Oral QHS PRN [name], Madie Reno, MD   100 mg at 05/12/20 2100  . white petrolatum (VASELINE) gel   Topical PRN Larita Fife, MD        Lab Results:  No results found. However, due to the size of the patient record, not all encounters were searched. Please check Results Review for a complete set of results.  Blood Alcohol level:  Lab Results  Component Value Date   ETH <10 48/54/6270    Metabolic Disorder Labs: Lab Results  Component Value Date   HGBA1C 6.0 04/10/2012   No results found for: PROLACTIN Lab Results  Component Value Date   CHOL 174 05/11/2010   TRIG 78.0 05/11/2010   HDL 52.30 05/11/2010   CHOLHDL 3 05/11/2010   VLDL 15.6 05/11/2010   LDLCALC 106 (H) 05/11/2010   LDLCALC 105 (H) 10/14/2009    Physical Findings: AIMS: Facial and Oral Movements Muscles of Facial Expression: None, normal Lips and Perioral Area: None, normal Jaw: None, normal Tongue: None, normal,Extremity Movements Upper (arms, wrists, hands, fingers): None, normal Lower (legs, knees, ankles, toes): None, normal, Trunk Movements Neck, shoulders, hips: None, normal, Overall Severity Severity of abnormal movements (highest score from questions above): None, normal Incapacitation due to abnormal movements: None, normal Patient's awareness of abnormal movements (rate only patient's report): No Awareness, Dental Status Current problems with teeth and/or dentures?: No Does patient usually wear dentures?: Yes (upper and lower bottom dentures)  CIWA:  CIWA-Ar Total: 2 COWS:  COWS Total Score: 3  Musculoskeletal: Strength & Muscle Tone: within normal limits Gait & Station: uses walker for assistance Patient leans: N/A  Psychiatric Specialty Exam: Physical Exam Vitals and nursing note reviewed.   Constitutional:  Appearance: Normal appearance.  HENT:     Head: Normocephalic and atraumatic.     Right Ear: External ear normal.     Left Ear: External ear normal.     Nose: Nose normal.     Mouth/Throat:     Mouth: Mucous membranes are moist.     Pharynx: Oropharynx is clear.  Eyes:     Extraocular Movements: Extraocular movements intact.     Conjunctiva/sclera: Conjunctivae normal.     Pupils: Pupils are equal, round, and reactive to light.  Cardiovascular:     Rate and Rhythm: Normal rate.     Pulses: Normal pulses.  Pulmonary:     Effort: Pulmonary effort is normal.     Breath sounds: Normal breath sounds.  Abdominal:     General: Abdomen is flat.     Palpations: Abdomen is soft.  Musculoskeletal:        General: No swelling. Normal range of motion.     Cervical back: Normal range of motion and neck supple.  Skin:    General: Skin is warm and dry.  Neurological:     General: No focal deficit present.     Mental Status: She is alert and oriented to person, place, and time.  Psychiatric:        Attention and Perception: Attention and perception normal.        Mood and Affect: Mood is anxious and depressed.        Speech: Speech normal.        Behavior: Behavior is cooperative.        Thought Content: Thought content does not include homicidal or suicidal ideation.        Cognition and Memory: Cognition and memory normal.        Judgment: Judgment normal.     Review of Systems  Constitutional: Positive for fatigue. Negative for appetite change.  HENT: Negative for rhinorrhea and sore throat.   Eyes: Negative for photophobia and visual disturbance.  Respiratory: Negative for cough and shortness of breath.   Cardiovascular: Negative for chest pain and palpitations.  Gastrointestinal: Negative for constipation, diarrhea, nausea and vomiting.  Endocrine: Negative for cold intolerance and heat intolerance.  Genitourinary: Negative for difficulty urinating and  dysuria.  Musculoskeletal: Negative for arthralgias and myalgias.  Skin: Negative for rash and wound.  Allergic/Immunologic: Positive for environmental allergies and food allergies.  Neurological: Negative for dizziness and headaches.  Hematological: Negative for adenopathy. Does not bruise/bleed easily.  Psychiatric/Behavioral: Positive for dysphoric mood. Negative for suicidal ideas. The patient is nervous/anxious.     Blood pressure 124/77, pulse 62, temperature 97.7 F (36.5 C), temperature source Oral, resp. rate 14, height 5\' 4"  (1.626 m), weight 63.7 kg, SpO2 100 %.Body mass index is 24.12 kg/m.  General Appearance: Casual  Eye Contact:  Fair  Speech:  Normal Rate  Volume:  Decreased  Mood:  Dysphoric  Affect:  Congruent and Constricted  Thought Process:  Coherent and Goal Directed  Orientation:  Full (Time, Place, and Person)  Thought Content:  Logical  Suicidal Thoughts:  No  Homicidal Thoughts:  No  Memory:  Immediate;   Fair Recent;   Fair Remote;   Fair  Judgement:  Other:  limited  Insight:  Fair  Psychomotor Activity:  Normal  Concentration:  Concentration: Fair and Attention Span: Fair  Recall:  [employer] of Knowledge:  Fair  Language:  Fair  Akathisia:  No  Handed:  Right  AIMS (if indicated):  Assets:  Communication Skills Desire for Improvement Financial Resources/Insurance Social Support  ADL's:  Intact  Cognition:  WNL  Sleep:  Number of Hours: 8     Treatment Plan Summary: Daily contact with patient to assess and evaluate symptoms and progress in treatment and Medication management  Patient is a 87 year old female with the above-stated past psychiatric history who is seen in follow-up.  Chart reviewed. Patient discussed with nursing. Patient reports partial mood improvement and no suicidal thoughts anymore. She is very anxious regarding her discharge plan. At this time, it appears living with her family is not an option. Looking into alternative  ALF placement options with social work. We will continue Lexapro 10mg  daily for anxiety and depression and Lorazepam 0.5mg  daily for anxiety.   Plan:  -continue inpatient psych admission; 15-minute checks; daily contact with patient to assess and evaluate symptoms and progress in treatment; psychoeducation.  -continue scheduled medications:  . escitalopram  10 mg Oral QHS  . famotidine  10 mg Oral BID  . influenza vaccine adjuvanted  0.5 mL Intramuscular Tomorrow-1000  . levothyroxine  100 mcg Oral Q0600  . loratadine  10 mg Oral Daily  . LORazepam  0.5 mg Oral Daily  . pneumococcal 23 valent vaccine  0.5 mL Intramuscular Tomorrow-1000  . spironolactone  25 mg Oral Daily    -continue PRN medications.  acetaminophen, alum & mag hydroxide-simeth, magnesium hydroxide, polyethylene glycol, polyvinyl alcohol, traZODone, white petrolatum  -Pertinent Labs: no new labs ordered today    -Consults: No new consults placed since yesterday    -Disposition: Looking into alternative placement option for her. All necessary aftercare will be arranged prior to discharge Likely d/c home with outpatient psych follow-up.  -  I certify that the patient does need, on a daily basis, active treatment furnished directly by or requiring the supervision of inpatient psychiatric facility personnel.   Salley Scarlet, MD 05/13/2020, 12:08 PM

## 2020-05-13 NOTE — Evaluation (Addendum)
Occupational Therapy Evaluation Patient Details Name: [name] MRN: [id number] DOB: 1929-12-29 Today's Date: 05/13/2020    History of Present Illness 87 year old female with the history of depression and anxiety, who was admitted due to worsened depression, anxiety and suicidal thoughts secondary not taking her psychotropic medications for about three weeks.   Clinical Impression   Pt seen for OT evaluation this date. Prior to admission, pt was residing in an ALF, and reports being mod-independent in all ADLs and functional mobility with RW. Pt currently performing functional mobility and standing ADLs supervision-min guard with RW, requiring MIN verbal cues to keep RW within base of support. Prior to admission, pt reports that ALF was in charge of obtaining medication and that pt was in charge of managing/taking medication independently. During OT eval, pt's ability to independently manage medication was assessed via medication management screening, with pt demonstrating ability to initiate task, but unable to identify errors. At this time, pt is unable to manage medication independently as pt was only able to place 23% "pills" correctly in a medication box. Pt would benefit from additional skilled OT services to improve safety awareness and maximize independence with ADLs/IADLs. Upon discharge, recommend SNF.      Follow Up Recommendations  SNF;Supervision - Intermittent    Equipment Recommendations  Other (comment) (defer to next venue of care)       Precautions / Restrictions Precautions Precautions: Fall Restrictions Weight Bearing Restrictions: No      Mobility Bed Mobility Overal bed mobility: Modified Independent                  Transfers Overall transfer level: Needs assistance Equipment used: Rolling walker (2 wheeled) Transfers: Sit to/from Stand Sit to Stand: Min guard              Balance Overall balance assessment: Needs assistance Sitting-balance  support: Feet supported Sitting balance-Leahy Scale: Good Sitting balance - Comments: able to bend down and don shoes   Standing balance support: Bilateral upper extremity supported;During functional activity Standing balance-Leahy Scale: Fair Standing balance comment: no overt lob, reliant on RW and external min guard for safety                           ADL either performed or assessed with clinical judgement   ADL Overall ADL's : Needs assistance/impaired     Grooming: Wash/dry hands;Supervision/safety;Set up;Standing               Lower Body Dressing: Supervision/safety;Sit to/from stand Lower Body Dressing Details (indicate cue type and reason): To don socks/shoes and adjust pants             Functional mobility during ADLs: Supervision/safety;Rolling walker       Vision Baseline Vision/History: Wears glasses Wears Glasses: At all times              Pertinent Vitals/Pain Pain Assessment: No/denies pain        Extremity/Trunk Assessment Upper Extremity Assessment Upper Extremity Assessment: Generalized weakness   Lower Extremity Assessment Lower Extremity Assessment: Generalized weakness   Cervical / Trunk Assessment Cervical / Trunk Assessment: Normal      Cognition Arousal/Alertness: Awake/alert Behavior During Therapy: WFL for tasks assessed/performed Overall Cognitive Status: No family/caregiver present to determine baseline cognitive functioning  General Comments: Pt required cues for continuing to use RW during standing ADLs. Cognition assessed via medication management screening. Pt able to initiate task, but was unable to identify errors. At this time, pt unable to manage medication independently as pt was only able to place 23% "pills" correctly in a medication box.              Home Living Family/patient expects to be discharged to:: Assisted living Living Arrangements: Other  (Comment) (Lived at ALF [facility]))                           Home Equipment: Gilford Rile - 2 wheels          Prior Functioning/Environment Level of Independence: Needs assistance  Gait / Transfers Assistance Needed: patient ambulates with RW at baseline ADL's / Homemaking Assistance Needed: Pt reports being independent with ADLs. Pt reports that ALF assisted with obtaining medication, and pt was managing medication independently            OT Problem List: Decreased safety awareness;Decreased activity tolerance;Decreased knowledge of precautions      OT Treatment/Interventions: Self-care/ADL training;Therapeutic exercise;DME and/or AE instruction;Cognitive remediation/compensation;Patient/family education;Balance training;Energy conservation    OT Goals(Current goals can be found in the care plan section) Acute Rehab OT Goals Patient Stated Goal: To be able to leave hospital OT Goal Formulation: With patient Time For Goal Achievement: 05/27/20 Potential to Achieve Goals: Good ADL Goals Pt Will Perform Lower Body Bathing: with modified independence;sit to/from stand Pt Will Perform Toileting - Clothing Manipulation and hygiene: with modified independence;sit to/from stand Additional ADL Goal #1: Pt will be able to identify medication managment errors in 3/4 opportunities  OT Frequency: Min 1X/week   Barriers to D/C: Decreased caregiver support          Co-evaluation PT/OT/SLP Co-Evaluation/Treatment: Yes Reason for Co-Treatment: Necessary to address cognition/behavior during functional activity PT goals addressed during session: Mobility/safety with mobility OT goals addressed during session: ADL's and self-care      AM-PAC OT "6 Clicks" Daily Activity     Outcome Measure Help from another person eating meals?: None Help from another person taking care of personal grooming?: A Little Help from another person toileting, which includes using toliet, bedpan, or  urinal?: A Little Help from another person bathing (including washing, rinsing, drying)?: A Little Help from another person to put on and taking off regular upper body clothing?: None Help from another person to put on and taking off regular lower body clothing?: A Little 6 Click Score: 20   End of Session Equipment Utilized During Treatment: Rolling walker Nurse Communication: Mobility status  Activity Tolerance: Patient tolerated treatment well Patient left: in bed;with call bell/phone within reach  OT Visit Diagnosis: Muscle weakness (generalized) (M62.81);Other symptoms and signs involving cognitive function                Time: 1550-1617 OT Time Calculation (min): 27 min Charges:  OT General Charges $OT Visit: 1 Visit OT Evaluation $OT Eval Moderate Complexity: 1 Mod OT Treatments $Self Care/Home Management : 8-22 mins  Fredirick Maudlin, OTR/L [place]

## 2020-05-13 NOTE — B[facility] Counselor (Addendum)
CSW spoke with patient at her request related to post discharge care. Patient reported that she has spoke with her daughter Precious Bard harp who agreed to assist with medication if she were to return to [facility]. Patient reported that she was agreeable to return under these conditions. Situation ongoing.   Signed:  Durenda Hurt, MSW, [place], LCASA 05/13/2020 2:22 PM   ADDENDUM CSW contacted [facility] to coordinate discharge aftercare. Rollene Fare, representative with [facility], shared that the patient is allowed to return under the circumstances that the patient be enrolled in home health services. Rollene Fare mentioned [facility], contact information Jeannette How, [phone]). CSW will follow up with patient and family. Situation ongoing.   Signed:  Durenda Hurt, MSW, [place], LCASA 05/13/2020 2:49 PM   ADDENDUM  CSW spoke with Lauryl Seyer, son at [phone], at request and written consent of patient. The son reported that they were agreeable with setting up the patient with home health services assuming the cost was affordable. He reported that he would call the agency to set up services and call CSW back. Situation ongoing.  Signed:  Durenda Hurt, MSW, [place], LCASA 05/13/2020 3:06 PM   ADDENDUM CSW spoke with family Chrislyn Seedorf and [name]) who reported they were able to call home health services and agree on a price. [name], daughter, reported that she will need assistance with transportation to [facility], however, she will meet her mother at the location to help her settle in. Patient is agreeable with this plan. CSW will assist with transportation and referral to [facility] tomorrow 14 May 2020.   Signed:  Durenda Hurt, MSW, [place], LCASA 05/13/2020 3:43 PM

## 2020-05-13 NOTE — Progress Notes (Signed)
D: Pt alert and oriented. Pt endorses experiencing anxiety/depression however is unable to given either a rating. Pt only states that they are worse. Pt denies experiencing any pain at this time. Pt denies experiencing any SI/HI, or AVH at this time.   Pt stated that she did not feel like getting up and only attended lunch. Breakfast and dinner was taken to the pt's room as well as medications. Pt was informed by her daughter that she was going to have to go back to [facility]. Pt appeared saddened by this but understands why her daughter is saying this. Pt shares that her daughter said she would come and see her every other day to make sure she was getting her medications and eating. PT did come this afternoon and work with the pt.  A: Scheduled medications administered to pt, per MD orders. Support and encouragement provided. Frequent verbal contact made. Routine safety checks conducted q15 minutes.   R: No adverse drug reactions noted. Pt verbally contracts for safety at this time. Pt complaint with medications. Pt interacts well with others on the unit minimally, mostly isolating in her room and laying in bed. Pt remains safe at this time. Will continue to monitor.

## 2020-05-13 NOTE — Evaluation (Signed)
Physical Therapy Evaluation Patient Details Name: [name] MRN: [id number] DOB: 09-03-1929 Today's Date: 05/13/2020   History of Present Illness  87 year old female with the history of depression and anxiety, who was admitted due to worsened depression, anxiety and suicidal thoughts secondary not taking her psychotropic medications for about three weeks.  Clinical Impression  Patient received in bed, agreeable to PT assessment. Patient reports she wants to go home. She is mod independent with bed mobility, transfers with min guard and ambulated 120 feet with RW and min guard. Reports she is fatigued with gait. No overt lob or difficulty with mobility, however will forget to bring walker with her at times. Patient will continue to benefit from skilled PT while here to improve activity tolerance and safety with mobility.      Follow Up Recommendations Home health PT;Supervision for mobility/OOB    Equipment Recommendations       Recommendations for Other Services       Precautions / Restrictions Precautions Precautions: Fall Restrictions Weight Bearing Restrictions: No      Mobility  Bed Mobility Overal bed mobility: Modified Independent                  Transfers Overall transfer level: Needs assistance Equipment used: Rolling walker (2 wheeled) Transfers: Sit to/from Stand Sit to Stand: Min guard            Ambulation/Gait Ambulation/Gait assistance: Min guard Gait Distance (Feet): 120 Feet Assistive device: Rolling walker (2 wheeled) Gait Pattern/deviations: Step-through pattern;Drifts right/left;Decreased step length - right;Decreased step length - left;Shuffle Gait velocity: decr   General Gait Details: patient without lob but reliant on RW for safety  Stairs            Wheelchair Mobility    Modified Rankin (Stroke Patients Only)       Balance Overall balance assessment: Needs assistance Sitting-balance support: Feet supported Sitting  balance-Leahy Scale: Good Sitting balance - Comments: able to bend down and don shoes   Standing balance support: Bilateral upper extremity supported;During functional activity Standing balance-Leahy Scale: Fair Standing balance comment: no overt lob, reliant on RW and external min guard for safety                             Pertinent Vitals/Pain Pain Assessment: No/denies pain    Home Living Family/patient expects to be discharged to:: Assisted living               Home Equipment: Walker - 2 wheels      Prior Function Level of Independence: Needs assistance   Gait / Transfers Assistance Needed: patient ambulates with RW at baseline           Hand Dominance        Extremity/Trunk Assessment   Upper Extremity Assessment Upper Extremity Assessment: Generalized weakness    Lower Extremity Assessment Lower Extremity Assessment: Generalized weakness    Cervical / Trunk Assessment Cervical / Trunk Assessment: Normal  Communication      Cognition Arousal/Alertness: Awake/alert Behavior During Therapy: WFL for tasks assessed/performed Overall Cognitive Status: Within Functional Limits for tasks assessed                                 General Comments: patient would tend to leave walker behind at times. Requires cues for safety      General Comments  Exercises     Assessment/Plan    PT Assessment Patient needs continued PT services  PT Problem List Decreased strength;Decreased mobility;Decreased activity tolerance;Decreased balance;Decreased safety awareness       PT Treatment Interventions DME instruction;Therapeutic activities;Therapeutic exercise;Gait training;Functional mobility training;Patient/family education    PT Goals (Current goals can be found in the Care Plan section)  Acute Rehab PT Goals Patient Stated Goal: to go home PT Goal Formulation: With patient Time For Goal Achievement: 05/20/20 Potential to  Achieve Goals: Good    Frequency Min 2X/week   Barriers to discharge        Co-evaluation               AM-PAC PT "6 Clicks" Mobility  Outcome Measure Help needed turning from your back to your side while in a flat bed without using bedrails?: None Help needed moving from lying on your back to sitting on the side of a flat bed without using bedrails?: None Help needed moving to and from a bed to a chair (including a wheelchair)?: A Little Help needed standing up from a chair using your arms (e.g., wheelchair or bedside chair)?: A Little Help needed to walk in hospital room?: A Little Help needed climbing 3-5 steps with a railing? : A Little 6 Click Score: 20    End of Session   Activity Tolerance: Patient limited by fatigue Patient left: in bed;with nursing/sitter in room Nurse Communication: Mobility status PT Visit Diagnosis: Muscle weakness (generalized) (M62.81);History of falling (Z91.81);Difficulty in walking, not elsewhere classified (R26.2)    Time: 4765-4650 PT Time Calculation (min) (ACUTE ONLY): 15 min   Charges:   PT Evaluation $PT Eval Moderate Complexity: 1 Mod PT Treatments $Gait Training: 8-22 mins        [employer], PT, [employer] 05/13/20,4:23 PM

## 2020-05-13 NOTE — Plan of Care (Signed)
Patient compliant with medication administration per MD orders   Problem: Medication: Goal: Compliance with prescribed medication regimen will improve Description: Patient complaint with prescribed medication regimen  Outcome: Progressing   

## 2020-05-13 NOTE — Tx Team (Signed)
Interdisciplinary Treatment and Diagnostic Plan Update  05/13/2020 Time of Session: 9:00AM [name] MRN: [id number]  Principal Diagnosis: Moderately severe recurrent major depression (HCC)  Secondary Diagnoses: Principal Problem:   Moderately severe recurrent major depression (HCC) Active Problems:   Hypothyroidism   Anxiety disorder   Hypertensive heart disease   OCD (obsessive compulsive disorder)   Current Medications:  Current Facility-Administered Medications  Medication Dose Route Frequency Provider Last Rate Last Admin  . acetaminophen (TYLENOL) tablet 650 mg  650 mg Oral Q6H PRN [name], [name], MD      . alum & mag hydroxide-simeth (MAALOX/MYLANTA) 200-200-20 MG/5ML suspension 30 mL  30 mL Oral Q4H PRN [name], [name], MD      . escitalopram (LEXAPRO) tablet 10 mg  10 mg Oral QHS [name], [name], MD   10 mg at 05/12/20 2100  . famotidine (PEPCID) tablet 10 mg  10 mg Oral BID [name], [name], MD   10 mg at 05/13/20 0805  . influenza vaccine adjuvanted (FLUAD) injection 0.5 mL  0.5 mL Intramuscular Tomorrow-1000 [name], [name], MD      . levothyroxine (SYNTHROID) tablet 100 mcg  100 mcg Oral Q0600 [name], [name], MD   100 mcg at 05/13/20 0611  . loratadine (CLARITIN) tablet 10 mg  10 mg Oral Daily [name], [name], MD   10 mg at 05/13/20 0805  . LORazepam (ATIVAN) tablet 0.5 mg  0.5 mg Oral Daily [name], [name], MD   0.5 mg at 05/13/20 0805  . magnesium hydroxide (MILK OF MAGNESIA) suspension 30 mL  30 mL Oral Daily PRN [name], [name], MD      . pneumococcal 23 valent vaccine (PNEUMOVAX-23) injection 0.5 mL  0.5 mL Intramuscular Tomorrow-1000 [name], [name], MD      . polyethylene glycol (MIRALAX / GLYCOLAX) packet 17 g  17 g Oral Daily PRN [name], [name], MD   17 g at 05/12/20 1236  . polyvinyl alcohol (LIQUIFILM TEARS) 1.4 % ophthalmic solution 1 drop  1 drop Both Eyes PRN [name], [name], MD      . spironolactone (ALDACTONE) tablet 25 mg  25 mg Oral Daily [name],  [name], MD   25 mg at 05/13/20 0805  . traZODone (DESYREL) tablet 100 mg  100 mg Oral QHS PRN [name], [name], MD   100 mg at 05/12/20 2100  . white petrolatum (VASELINE) gel   Topical PRN [name], [name], MD       PTA Medications: Medications Prior to Admission  Medication Sig Dispense Refill Last Dose  . apixaban (ELIQUIS) 5 MG TABS tablet Take 1 tablet (5 mg total) by mouth 2 (two) times daily. Annual appt is due must see provider for future refills 10 tablet 1   . cetirizine (ZYRTEC) 10 MG tablet Take 1 tablet (10 mg total) by mouth daily. 100 tablet 3   . clotrimazole-betamethasone (LOTRISONE) cream Apply 1 application topically in the morning and at bedtime.      . escitalopram (LEXAPRO) 10 MG tablet Take 1 tablet (10 mg total) by mouth at bedtime. 30 tablet 0   . famotidine (PEPCID) 10 MG tablet Take 10 mg by mouth 2 (two) times daily.      . LORazepam (ATIVAN) 0.5 MG tablet Take 1 tablet (0.5 mg total) by mouth daily. 90 tablet 0   . magnesium oxide (MAG-OX) 400 MG tablet Take 0.5 tablets (200 mg total) by mouth daily. Needs appt for further refills 45 tablet 0   .   REFRESH TEARS 0.5 % SOLN Place 1 drop into both eyes daily as needed (dry eyes).   0   . spironolactone (ALDACTONE) 25 MG tablet Take 1 tablet (25 mg total) by mouth daily. (Patient not taking: Reported on 10/09/2019) 90 tablet 1   . SYNTHROID 100 MCG tablet Take 1 tablet (100 mcg total) by mouth daily before breakfast. Follow-up appt is due w/labs must see provider for future refills 30 tablet 0   . tolterodine (DETROL LA) 2 MG 24 hr capsule Take 1 capsule (2 mg total) by mouth daily. 90 capsule 3     Patient Stressors: Health problems Medication change or noncompliance  Patient Strengths: Ability for insight General fund of knowledge Motivation for treatment/growth  Treatment Modalities: Medication Management, Group therapy, Case management,  1 to 1 session with clinician, Psychoeducation, Recreational  therapy.   Physician Treatment Plan for Primary Diagnosis: Moderately severe recurrent major depression ([place]) Long Term Goal(s): Improvement in symptoms so as ready for discharge Improvement in symptoms so as ready for discharge   Short Term Goals: Ability to identify changes in lifestyle to reduce recurrence of condition will improve Ability to verbalize feelings will improve Ability to disclose and discuss suicidal ideas Ability to demonstrate self-control will improve Ability to identify and develop effective coping behaviors will improve Ability to maintain clinical measurements within normal limits will improve Compliance with prescribed medications will improve Ability to identify triggers associated with substance abuse/mental health issues will improve Ability to identify changes in lifestyle to reduce recurrence of condition will improve Ability to verbalize feelings will improve Ability to disclose and discuss suicidal ideas Ability to demonstrate self-control will improve Ability to identify and develop effective coping behaviors will improve Ability to maintain clinical measurements within normal limits will improve Compliance with prescribed medications will improve Ability to identify triggers associated with substance abuse/mental health issues will improve  Medication Management: Evaluate patient's response, side effects, and tolerance of medication regimen.  Therapeutic Interventions: 1 to 1 sessions, Unit Group sessions and Medication administration.  Evaluation of Outcomes: Not Met  Physician Treatment Plan for Secondary Diagnosis: Principal Problem:   Moderately severe recurrent major depression ([place]) Active Problems:   Hypothyroidism   Anxiety disorder   Hypertensive heart disease   OCD (obsessive compulsive disorder)  Long Term Goal(s): Improvement in symptoms so as ready for discharge Improvement in symptoms so as ready for discharge   Short Term Goals:  Ability to identify changes in lifestyle to reduce recurrence of condition will improve Ability to verbalize feelings will improve Ability to disclose and discuss suicidal ideas Ability to demonstrate self-control will improve Ability to identify and develop effective coping behaviors will improve Ability to maintain clinical measurements within normal limits will improve Compliance with prescribed medications will improve Ability to identify triggers associated with substance abuse/mental health issues will improve Ability to identify changes in lifestyle to reduce recurrence of condition will improve Ability to verbalize feelings will improve Ability to disclose and discuss suicidal ideas Ability to demonstrate self-control will improve Ability to identify and develop effective coping behaviors will improve Ability to maintain clinical measurements within normal limits will improve Compliance with prescribed medications will improve Ability to identify triggers associated with substance abuse/mental health issues will improve     Medication Management: Evaluate patient's response, side effects, and tolerance of medication regimen.  Therapeutic Interventions: 1 to 1 sessions, Unit Group sessions and Medication administration.  Evaluation of Outcomes: Not Met   RN Treatment Plan for Primary  Diagnosis: Moderately severe recurrent major depression ([place]) Long Term Goal(s): Knowledge of disease and therapeutic regimen to maintain health will improve  Short Term Goals: Ability to verbalize frustration and anger appropriately will improve, Ability to demonstrate self-control, Ability to participate in decision making will improve, Ability to verbalize feelings will improve, Ability to disclose and discuss suicidal ideas, Ability to identify and develop effective coping behaviors will improve and Compliance with prescribed medications will improve  Medication Management: RN will administer  medications as ordered by provider, will assess and evaluate patient's response and provide education to patient for prescribed medication. RN will report any adverse and/or side effects to prescribing provider.  Therapeutic Interventions: 1 on 1 counseling sessions, Psychoeducation, Medication administration, Evaluate responses to treatment, Monitor vital signs and CBGs as ordered, Perform/monitor CIWA, COWS, AIMS and Fall Risk screenings as ordered, Perform wound care treatments as ordered.  Evaluation of Outcomes: Not Met   LCSW Treatment Plan for Primary Diagnosis: Moderately severe recurrent major depression ([place]) Long Term Goal(s): Safe transition to appropriate next level of care at discharge, Engage patient in therapeutic group addressing interpersonal concerns.  Short Term Goals: Engage patient in aftercare planning with referrals and resources, Increase social support, Increase ability to appropriately verbalize feelings, Increase emotional regulation, Facilitate acceptance of mental health diagnosis and concerns, Identify triggers associated with mental health/substance abuse issues and Increase skills for wellness and recovery  Therapeutic Interventions: Assess for all discharge needs, 1 to 1 time with Social worker, Explore available resources and support systems, Assess for adequacy in community support network, Educate family and significant other(s) on suicide prevention, Complete Psychosocial Assessment, Interpersonal group therapy.  Evaluation of Outcomes: Not Met   Progress in Treatment: Attending groups: No. Participating in groups: No. Taking medication as prescribed: Yes. Toleration medication: Yes. Family/Significant other contact made: No, will contact:  contact has been unable to be established with daughter, [name]. Patient understands diagnosis: Yes. Discussing patient identified problems/goals with staff: Yes. Medical problems stabilized or resolved:  Yes. Denies suicidal/homicidal ideation: Yes. Issues/concerns per patient self-inventory: No. Other: None.  New problem(s) identified: No, Describe:  none.  New Short Term/Long Term Goal(s): medication management for mood stabilization; elimination of SI thoughts; development of comprehensive mental wellness/sobriety plan.  Patient Goals: "To find a new place to live."  Discharge Plan or Barriers: CSW will assist pt with development of a discharge/aftercare plan. Pt is interested in ALF/SNF placement at a different facility.   Reason for Continuation of Hospitalization: Depression Medication stabilization Suicidal ideation  Estimated Length of Stay: 1-7 days  Attendees: Patient: [name] 05/13/2020 10:21 AM  Physician: Selina Cooley, MD 05/13/2020 10:21 AM  Nursing: Graceann Congress, RN 05/13/2020 10:21 AM  RN Care Manager: 05/13/2020 10:21 AM  Social Worker: Chalmers Guest. Guerry Bruin, MSW, [place], [place] 05/13/2020 10:21 AM  Recreational Therapist:  05/13/2020 10:21 AM  Other: Assunta Curtis, MSW, LCSW 05/13/2020 10:21 AM  Other:  05/13/2020 10:21 AM  Other: 05/13/2020 10:21 AM    Scribe for Treatment Team: Shirl Harris, LCSW 05/13/2020 10:21 AM

## 2020-05-14 DIAGNOSIS — F332 Major depressive disorder, recurrent severe without psychotic features: Secondary | ICD-10-CM | POA: Diagnosis not present

## 2020-05-14 MED ORDER — LORAZEPAM 0.5 MG PO TABS: 0.5000 mg | ORAL_TABLET | Freq: Every day | ORAL | 1 refills | Status: DC

## 2020-05-14 MED ORDER — MAGNESIUM OXIDE 400 MG PO TABS: 0.5000 | ORAL_TABLET | Freq: Every day | ORAL | 0 refills | Status: DC

## 2020-05-14 MED ORDER — FAMOTIDINE 10 MG PO TABS: 10.0000 mg | ORAL_TABLET | Freq: Two times a day (BID) | ORAL | 1 refills | Status: DC

## 2020-05-14 MED ORDER — SPIRONOLACTONE 25 MG PO TABS: 25.0000 mg | ORAL_TABLET | Freq: Every day | ORAL | 1 refills | Status: DC

## 2020-05-14 MED ORDER — LEVOTHYROXINE SODIUM 100 MCG PO TABS
100.0000 ug | ORAL_TABLET | Freq: Every day | ORAL | 1 refills | Status: DC
Start: 2020-05-15 — End: 2020-11-23

## 2020-05-14 MED ORDER — ESCITALOPRAM OXALATE 10 MG PO TABS: 10.0000 mg | ORAL_TABLET | Freq: Every day | ORAL | 1 refills | Status: DC

## 2020-05-14 MED ORDER — TRAZODONE HCL 100 MG PO TABS
100.0000 mg | ORAL_TABLET | Freq: Every evening | ORAL | 1 refills | Status: DC | PRN
Start: 2020-05-14 — End: 2020-11-23

## 2020-05-14 NOTE — Progress Notes (Signed)
Recreation Therapy Notes   Date: 05/14/2020  Time: 9:30 am   Location: Craft room     Behavioral response: N/A   Intervention Topic: Time Management   Discussion/Intervention: Patient did not attend group.   Clinical Observations/Feedback:  Patient did not attend group.   Ules Marsala LRT/CTRS        Marlisa Caridi 05/14/2020 10:33 AM

## 2020-05-14 NOTE — [facility] Suicide Risk Assessment (Signed)
[facility] INPATIENT:  Family/Significant Other Suicide Prevention Education  Suicide Prevention Education:  Education Completed; [name],  (Son) has been identified by the patient as the family member/significant other with whom the patient will be residing, and identified as the person(s) who will aid the patient in the event of a mental health crisis (suicidal ideations/suicide attempt).  With written consent from the patient, the family member/significant other has been provided the following suicide prevention education, prior to the and/or following the discharge of the patient.  The suicide prevention education provided includes the following:  Suicide risk factors  Suicide prevention and interventions  National Suicide Hotline telephone number  [facility] assessment telephone number  [facility] Emergency Assistance [place] and/or Residential Mobile Crisis Unit telephone number  Request made of family/significant other to:  Remove weapons (e.g., guns, rifles, knives), all items previously/currently identified as safety concern.    Remove drugs/medications (over-the-counter, prescriptions, illicit drugs), all items previously/currently identified as a safety concern.  The family member/significant other verbalizes understanding of the suicide prevention education information provided.  The family member/significant other agrees to remove the items of safety concern listed above.   [name] 05/14/2020, 11:23 AM

## 2020-05-14 NOTE — Progress Notes (Signed)
  [facility] Adult Case Management Discharge Plan :  Will you be returning to the same living situation after discharge:  Yes,  Patient to return to place of residence.  At discharge, do you have transportation home?: Yes,  CSW to arrange transportation, non-emergent transport.   Do you have the ability to pay for your medications: Yes,  Medicare Part A & B  Release of information consent forms completed and in the chart;  Patient's signature needed at discharge.  Patient to Follow up at:  Follow-up Information    [facility] Follow up.   Specialty: Geriatric Medicine Why:  Dr Larey Seat will see patient at her residence on 19 May 2020. Please share discharge documentation including current medication list during this appointment.  Contact information: [place] [id number] [phone]        [facility] Follow up.   Specialty: Home Health Services Why: Home Health will meet with patient no later than Monday 19 May 2020.  Contact information: [address] [place] [address] [phone]               Next level of care provider has access to [place] and Suicide Prevention discussed: Yes,  SPE completed with patient and family member   Have you used any form of tobacco in the last 30 days? (Cigarettes, Smokeless Tobacco, Cigars, and/or Pipes): No  Has patient been referred to the Quitline?: N/A patient is not a smoker  Patient has been referred for addiction treatment: N/A  Durenda Hurt, Latanya Presser 05/14/2020, 3:36 PM

## 2020-05-14 NOTE — [facility] Counselor (Signed)
CSW has coordinated aftercare based on patient preference and PT/OT recommendations. Home health care was coordinated with [facility], POC [place] [phone]). Services will be rendered no later than Monday 19 May 2020. Patient was also set up with medication assistance through he senior living facility. Lastly, Patient was scheduled for hospital discharge follow up appointment with PCP. CSW informed patient of care coordination outcomes.  Signed:  Durenda Hurt, MSW, [place], LCASA 05/14/2020 4:08 PM

## 2020-05-14 NOTE — Progress Notes (Signed)
   05/14/20 1410  Clinical Encounter Type  Visited With Patient  Visit Type Initial   I visited with [name]. I listened with compassion as she talked about how she missed her children.   [place], [place]

## 2020-05-14 NOTE — Progress Notes (Signed)
Pt reports that milk of magnesia given earlier in the day helped with her heartburn. She also reports she had a BM today after several days of no BMs.

## 2020-05-14 NOTE — Discharge Summary (Signed)
Physician Discharge Summary Note  Patient:  [name] is an 87 y.o., female MRN:  [id number] DOB:  April 19, 1930 Patient phone:  [phone] (home)  Patient address:   [address] [place] [address],  Total Time spent with patient: 30 minutes  Date of Admission:  05/09/2020 Date of Discharge: 05/14/2020  Reason for Admission:  Worsening depression and anxiety had  for the last three weeks to the point she started feeling suicidal  Principal Problem: Moderately severe recurrent major depression [facility]) Discharge Diagnoses: Principal Problem:   Moderately severe recurrent major depression ([place]) Active Problems:   Hypothyroidism   Anxiety disorder   Hypertensive heart disease   OCD (obsessive compulsive disorder)   Past Psychiatric History:  Past Dx: chronic anxiety and depression. Psychiatrist: Dr. Dossie Der in [facility]  Outpatient psych meds: 0.5 mg of lorazepam and 10 mg of Lexapro chronically.  Patient denies h/o past suicidal attempts. Patient reports she has had one prior psychiatric hospital in [place] years ago for depression and SI.  Past Medical History:  Past Medical History:  Diagnosis Date  . Anxiety   . Bradycardia   . Breast cancer, left breast ([place]) 1995  . Breast cancer, right breast ([place]) 2002  . CHF (congestive heart failure) ([place])   . Depression    Dr Cheryln Manly  . Discoid lupus    skin  . Full dentures   . GERD (gastroesophageal reflux disease)   . Glaucoma   . Gout   . Hx of echocardiogram    a.  Echocardiogram (03/2011): Mild LVH, EF 57-32%, grade 1 diastolic dysfunction, mild MR, mild to moderate TR, PASP 42;  b. Echo (05/2013):  Mod LVH, EF 65-70%, no WMA, Gr 1 DD, mild MR, mod TR, PASP 39  . Hx of exercise stress test    a. ETT-echocardiogram (06/08/11): Sub-optimal exercise. Test stopped early due to dizziness and hypotension. EF 60%.  Non-diagnostic.;   b. Lexiscan Myoview (06/2013):  Diaphragmatic attenuation, no ischemia, EF 61%.  Low Risk   . Hypertension   . Hypothyroidism   . IBS (irritable bowel syndrome)   . Incontinence   . OSA (obstructive sleep apnea) 07/20/2016   Mild with AHI 12/hr now on CPAP  . Osteoarthritis   . Osteopenia   . Pacemaker 2009   Brady//Chronotropic incompetence with normal pacemaker function  . Thyroid disease   . Wears hearing aid    both ears    Past Surgical History:  Procedure Laterality Date  . BREAST BIOPSY Left 1995  . BREAST LUMPECTOMY Left 1995   axillary node dissectoin  . CARDIAC CATHETERIZATION N/A 02/13/2016   Procedure: Right Heart Cath;  Surgeon: Larey Dresser, MD;  Location: [place] [facility];  Service: Cardiovascular;  Laterality: N/A;  . CATARACT EXTRACTION W/ INTRAOCULAR LENS  IMPLANT, BILATERAL Bilateral 06/2009 - 12.2915   left - right  . DORSAL COMPARTMENT RELEASE  2000   left  . HAMMER TOE SURGERY Left 2004  . HEMORRHOID BANDING    . INSERT / REPLACE / REMOVE PACEMAKER  2009  . KNEE ARTHROSCOPY Left 08/29/2012   Procedure: LEFT KNEE ARTHROSCOPY ;  Surgeon: Hessie Dibble, MD;  Location: [place];  Service: Orthopedics;  Laterality: Left;  [name] MASTECTOMY Bilateral 09/2000   nbx  . PPM GENERATOR CHANGEOUT N/A 07/16/2019   Procedure: PPM GENERATOR CHANGEOUT;  Surgeon: Deboraha Sprang, MD;  Location: [place] [facility];  Service: Cardiovascular;  Laterality: N/A;  . TRIGGER FINGER RELEASE Right 07/31/2013  Procedure: EXCISE MASS RIGHT INDEX A-1 PLLLEY RELEASE A-1 RIGHT INDEX;  Surgeon: Cammie Sickle., MD;  Location: [place];  Service: Orthopedics;  Laterality: Right;  [name] VAGINAL HYSTERECTOMY     Family History:  Family History  Problem Relation Age of Onset  . Ovarian cancer Mother   . Heart disease Father   . Stroke Sister   . Stroke Brother   . Cancer Brother   . Heart disease Brother    Family Psychiatric  History: Denies Social History:  Social History   Substance and Sexual Activity  Alcohol Use No  .  Alcohol/week: 0.0 standard drinks     Social History   Substance and Sexual Activity  Drug Use No    Social History   Socioeconomic History  . Marital status: Widowed    Spouse name: Not on file  . Number of children: 3  . Years of education: 11  . Highest education level: Not on file  Occupational History  . Occupation: retired  Tobacco Use  . Smoking status: Never Smoker  . Smokeless tobacco: Never Used  Vaping Use  . Vaping Use: Never used  Substance and Sexual Activity  . Alcohol use: No    Alcohol/week: 0.0 standard drinks  . Drug use: No  . Sexual activity: Not Currently  Other Topics Concern  . Not on file  Social History Narrative   She is a vegan now 2010.     Lives at [facility].     Retired from Orthoptist.    Education: college.     3 children.    Social Determinants of Health   Financial Resource Strain: Not on file  Food Insecurity: Not on file  Transportation Needs: Not on file  Physical Activity: Not on file  Stress: Not on file  Social Connections: Not on file    Hospital Course:  54 year oldfemale with the history of depression and anxiety, who was admitteddue to worsened depression, anxiety and suicidal thoughtssecondarynot taking her psychotropic medications for about three weeks. Ativan 0.5 mg daily, and Lexapro 10 mg nightly restarted, and home medications were continued. Mood improved swiftly, and patient denied suicidal ideations, homicidal ideations, auditory hallucinations, and visual hallucinations. Her ALF provided home health services that have been arranged prior to discharge by her children. Her daughter also plans to visit three times weekly to ensure patient is getting her medications appropriately and eating well. At time of discharge her three children, patient, and treatment team felt she was safe to discharge with outpatient follow-up.   Physical Findings: AIMS: Facial and Oral Movements Muscles of Facial Expression:  None, normal Lips and Perioral Area: None, normal Jaw: None, normal Tongue: None, normal,Extremity Movements Upper (arms, wrists, hands, fingers): None, normal Lower (legs, knees, ankles, toes): None, normal, Trunk Movements Neck, shoulders, hips: None, normal, Overall Severity Severity of abnormal movements (highest score from questions above): None, normal Incapacitation due to abnormal movements: None, normal Patient's awareness of abnormal movements (rate only patient's report): No Awareness, Dental Status Current problems with teeth and/or dentures?: No Does patient usually wear dentures?: Yes (upper and lower bottom dentures)  CIWA:  CIWA-Ar Total: 2 COWS:  COWS Total Score: 3  Musculoskeletal: Strength & Muscle Tone: decreased Gait & Station: utilizes rolling walker to ambulate Patient leans: Front  Psychiatric Specialty Exam: Physical Exam Vitals and nursing note reviewed.  Constitutional:      Appearance: Normal appearance.  HENT:     Head: Normocephalic and atraumatic.  Right Ear: External ear normal.     Left Ear: External ear normal.     Nose: Nose normal.     Mouth/Throat:     Mouth: Mucous membranes are moist.     Pharynx: Oropharynx is clear.  Eyes:     Extraocular Movements: Extraocular movements intact.     Conjunctiva/sclera: Conjunctivae normal.     Pupils: Pupils are equal, round, and reactive to light.  Cardiovascular:     Rate and Rhythm: Normal rate.     Pulses: Normal pulses.  Pulmonary:     Effort: Pulmonary effort is normal.     Breath sounds: Normal breath sounds.  Abdominal:     General: Abdomen is flat.     Palpations: Abdomen is soft.  Musculoskeletal:        General: No swelling. Normal range of motion.     Cervical back: Normal range of motion and neck supple.  Skin:    General: Skin is warm and dry.  Neurological:     General: No focal deficit present.     Mental Status: She is alert and oriented to person, place, and time.   Psychiatric:        Mood and Affect: Mood normal.        Behavior: Behavior normal.        Thought Content: Thought content normal.        Judgment: Judgment normal.     Review of Systems  Constitutional: Negative for appetite change and fatigue.  HENT: Negative for rhinorrhea and sore throat.   Eyes: Negative for photophobia and visual disturbance.  Respiratory: Negative for cough and shortness of breath.   Cardiovascular: Negative for chest pain and palpitations.  Gastrointestinal: Negative for constipation, diarrhea, nausea and vomiting.  Endocrine: Negative for cold intolerance and heat intolerance.  Genitourinary: Negative for difficulty urinating and dysuria.  Musculoskeletal: Negative for arthralgias and myalgias.  Skin: Negative for rash and wound.  Psychiatric/Behavioral: Negative for suicidal ideas.   Blood pressure 116/71, pulse 61, temperature 97.7 F (36.5 C), temperature source Oral, resp. rate 17, height 5\' 4"  (1.626 m), weight 63.7 kg, SpO2 98 %.Body mass index is 24.12 kg/m.  General Appearance: Well Groomed  Engineer, water::  Good  Speech:  Clear and Coherent and Normal Rate  Volume:  Normal  Mood:  Euthymic  Affect:  Congruent  Thought Process:  Coherent and Linear  Orientation:  Full (Time, Place, and Person)  Thought Content:  Logical  Suicidal Thoughts:  No  Homicidal Thoughts:  No  Memory:  Immediate;   Fair Recent;   Fair Remote;   Fair  Judgement:  Intact  Insight:  Fair  Psychomotor Activity:  Normal  Concentration:  Fair  Recall:  [employer] of Knowledge:Fair  Language: Fair  Akathisia:  Negative  Handed:  Right  AIMS (if indicated):     Assets:  Communication Skills Desire for Improvement Financial Resources/Insurance Housing Physical Health Resilience Social Support  Sleep:  Number of Hours: 8.25  Cognition: WNL  ADL's:  Intact        Have you used any form of tobacco in the last 30 days? (Cigarettes, Smokeless Tobacco, Cigars,  and/or Pipes): No  Has this patient used any form of tobacco in the last 30 days? (Cigarettes, Smokeless Tobacco, Cigars, and/or Pipes) No  Blood Alcohol level:  Lab Results  Component Value Date   ETH <10 [id number]    Metabolic Disorder Labs:  Lab Results  Component Value Date  HGBA1C 6.0 04/10/2012   No results found for: PROLACTIN Lab Results  Component Value Date   CHOL 174 05/11/2010   TRIG 78.0 05/11/2010   HDL 52.30 05/11/2010   CHOLHDL 3 05/11/2010   VLDL 15.6 05/11/2010   LDLCALC 106 (H) 05/11/2010   LDLCALC 105 (H) 10/14/2009    See Psychiatric Specialty Exam and Suicide Risk Assessment completed by Attending Physician prior to discharge.  Discharge destination:  ALF  Is patient on multiple antipsychotic therapies at discharge:  No   Has Patient had three or more failed trials of antipsychotic monotherapy by history:  No  Recommended Plan for Multiple Antipsychotic Therapies: NA  Discharge Instructions    Diet - low sodium heart healthy   Complete by: As directed    Increase activity slowly   Complete by: As directed      Allergies as of 05/14/2020      Reactions   Lactase Diarrhea   Amlodipine Besy-benazepril Hcl Swelling   Amlodipine Besylate Swelling   Clonidine Hydrochloride    REACTION: tired   Lactose Intolerance (gi) Diarrhea, Other (See Comments)   Valsartan Other (See Comments)   Tongue swelling    Verapamil Other (See Comments)   unknown      Medication List    TAKE these medications     Indication  apixaban 5 MG Tabs tablet Commonly known as: Eliquis Take 1 tablet (5 mg total) by mouth 2 (two) times daily. Annual appt is due must see provider for future refills  Indication: Thromboembolism secondary to Atrial Fibrillation   cetirizine 10 MG tablet Commonly known as: ZYRTEC Take 1 tablet (10 mg total) by mouth daily.  Indication: Hayfever   clotrimazole-betamethasone cream Commonly known as: LOTRISONE Apply 1 application  topically in the morning and at bedtime.  Indication: Athlete's Foot   escitalopram 10 MG tablet Commonly known as: LEXAPRO Take 1 tablet (10 mg total) by mouth at bedtime.  Indication: Generalized Anxiety Disorder, Major Depressive Disorder   famotidine 10 MG tablet Commonly known as: PEPCID Take 1 tablet (10 mg total) by mouth 2 (two) times daily.  Indication: Gastroesophageal Reflux Disease   levothyroxine 100 MCG tablet Commonly known as: SYNTHROID Take 1 tablet (100 mcg total) by mouth daily at 6 (six) AM. Start taking on: May 15, 2020 What changed:   when to take this  additional instructions  Indication: Underactive Thyroid   LORazepam 0.5 MG tablet Commonly known as: ATIVAN Take 1 tablet (0.5 mg total) by mouth daily.  Indication: Feeling Anxious, pt has an appointment on 06/18/19.   magnesium oxide 400 MG tablet Commonly known as: MAG-OX Take 0.5 tablets (200 mg total) by mouth daily. Needs appt for further refills  Indication: Acid Indigestion   Refresh Tears 0.5 % Soln Generic drug: carboxymethylcellulose Place 1 drop into both eyes daily as needed (dry eyes).  Indication: Irritation of the Eye   spironolactone 25 MG tablet Commonly known as: ALDACTONE Take 1 tablet (25 mg total) by mouth daily.  Indication: High Blood Pressure Disorder   tolterodine 2 MG 24 hr capsule Commonly known as: DETROL LA Take 1 capsule (2 mg total) by mouth daily.  Indication: Urinary Urgency   traZODone 100 MG tablet Commonly known as: DESYREL Take 1 tablet (100 mg total) by mouth at bedtime as needed for sleep.  Indication: Trouble Sleeping        Follow-up recommendations:  Activity:  as tolerated Diet:  regular diet  Comments:  30-day scripts with one  refill sent to [facility] per ALF request  Signed: Salley Scarlet, MD 05/14/2020, 1:31 PM

## 2020-05-14 NOTE — Plan of Care (Signed)
Patient is calm and cooperative. She denies SI, HI, and AVH. She feels her anxiety is under control and feels ready for discharge. She does not endorse depression at this time. She states she is relieved her daughter will help pick up her medications when she leaves the hospital.   Pt was educated on her morning medications and took them with no complaint. When asked about her last BM, she stated it had been a few days. This Probation officer offered her something to help, but she stated, "I don't want to be ungrateful, but I don't want to take anything because I am leaving today and I don't want to be going back and forth to the bathroom. I will wait until I am at the facility." Pt states she has no pain or other physical concerns.  Pt remains safe on the unit at this time. Q15 min safety checks are maintained.  Problem: Education: Goal: Ability to make informed decisions regarding treatment will improve Outcome: Progressing   Problem: Coping: Goal: Coping ability will improve Outcome: Progressing   Problem: Health Behavior/Discharge Planning: Goal: Identification of resources available to assist in meeting health care needs will improve Outcome: Progressing   Problem: Medication: Goal: Compliance with prescribed medication regimen will improve Description: Patient complaint with prescribed medication regimen  Outcome: Progressing   Problem: Self-Concept: Goal: Ability to disclose and discuss suicidal ideas will improve Outcome: Progressing Goal: Will verbalize positive feelings about self Outcome: Progressing

## 2020-05-14 NOTE — Progress Notes (Signed)
Pt was given discharge envelope with instruction on medications and follow up appointments. Pt was given her evening dose of Lexapro as requested by pt prior to discharge. Pt was given the chance to ask questions. Pt's belongings were returned. Pt was safely discharged to ambulance transporters at the entrance of the unit with her belongings, discharge instructions, and pt facesheet. At the time of discharge, pt did not appear to be in any physical distress.

## 2020-05-14 NOTE — BHH Group Notes (Signed)
LCSW Group Therapy Note  05/14/2020 1:57 PM  Type of Therapy/Topic:  Group Therapy:  Emotion Regulation  Participation Level:  Did Not Attend   Description of Group:   The purpose of this group is to assist patients in learning to regulate negative emotions and experience positive emotions. Patients will be guided to discuss ways in which they have been vulnerable to their negative emotions. These vulnerabilities will be juxtaposed with experiences of positive emotions or situations, and patients will be challenged to use positive emotions to combat negative ones. Special emphasis will be placed on coping with negative emotions in conflict situations, and patients will process healthy conflict resolution skills.  Therapeutic Goals: 1. Patient will identify two positive emotions or experiences to reflect on in order to balance out negative emotions 2. Patient will label two or more emotions that they find the most difficult to experience 3. Patient will demonstrate positive conflict resolution skills through discussion and/or role plays  Summary of Patient Progress: Patient did not attend group despite encouraged participation.    Therapeutic Modalities:   Cognitive Behavioral Therapy Feelings Identification Dialectical Behavioral Therapy  Paulla Dolly, MSW, [place], [place] 05/14/2020 1:57 PM

## 2020-05-14 NOTE — BHH Suicide Risk Assessment (Signed)
[facility] Discharge Suicide Risk Assessment   Principal Problem: Moderately severe recurrent major depression [facility]) Discharge Diagnoses: Principal Problem:   Moderately severe recurrent major depression ([place]) Active Problems:   Hypothyroidism   Anxiety disorder   Hypertensive heart disease   OCD (obsessive compulsive disorder)   Total Time spent with patient: 30 minutes  Musculoskeletal: Strength & Muscle Tone: within normal limits Gait & Station: normal Patient leans: N/A  Psychiatric Specialty Exam: Review of Systems  Constitutional: Negative for appetite change and fatigue.  HENT: Negative for rhinorrhea and sore throat.   Eyes: Negative for photophobia and visual disturbance.  Respiratory: Negative for cough and shortness of breath.   Cardiovascular: Negative for chest pain and palpitations.  Gastrointestinal: Negative for constipation, diarrhea, nausea and vomiting.  Endocrine: Negative for cold intolerance and heat intolerance.  Genitourinary: Negative for difficulty urinating and dysuria.  Musculoskeletal: Negative for arthralgias and myalgias.  Skin: Negative for rash and wound.  Psychiatric/Behavioral: Negative for suicidal ideas.    Blood pressure 116/71, pulse 61, temperature 97.7 F (36.5 C), temperature source Oral, resp. rate 17, height 5\' 4"  (1.626 m), weight 63.7 kg, SpO2 98 %.Body mass index is 24.12 kg/m.  General Appearance: Well Groomed  Engineer, water::  Good  Speech:  Clear and Coherent and Normal Rate  Volume:  Normal  Mood:  Euthymic  Affect:  Congruent  Thought Process:  Coherent and Linear  Orientation:  Full (Time, Place, and Person)  Thought Content:  Logical  Suicidal Thoughts:  No  Homicidal Thoughts:  No  Memory:  Immediate;   Fair Recent;   Fair Remote;   Fair  Judgement:  Intact  Insight:  Fair  Psychomotor Activity:  Normal  Concentration:  Fair  Recall:  [employer] of Knowledge:Fair  Language: Fair  Akathisia:  Negative  Handed:   Right  AIMS (if indicated):     Assets:  Communication Skills Desire for Improvement Financial Resources/Insurance Housing Physical Health Resilience Social Support  Sleep:  Number of Hours: 8.25  Cognition: WNL  ADL's:  Intact   Mental Status Per Nursing Assessment::   On Admission:  NA  Demographic Factors:  Divorced or widowed  Loss Factors: NA  Historical Factors: Impulsivity  Risk Reduction Factors:   Sense of responsibility to family, Religious beliefs about death, Positive social support, Positive therapeutic relationship and Positive coping skills or problem solving skills  Continued Clinical Symptoms:  Depression:   Recent sense of peace/wellbeing Previous Psychiatric Diagnoses and Treatments  Cognitive Features That Contribute To Risk:  None    Suicide Risk:  Minimal: No identifiable suicidal ideation.  Patients presenting with no risk factors but with morbid ruminations; may be classified as minimal risk based on the severity of the depressive symptoms    Plan Of Care/Follow-up recommendations:  Activity:  as tolerated Diet:  low-sodium, heart healthy diet  Salley Scarlet, MD 05/14/2020, 1:27 PM

## 2020-05-15 ENCOUNTER — Telehealth: Payer: Self-pay | Admitting: Internal Medicine

## 2020-05-15 NOTE — Telephone Encounter (Signed)
[facility] calling to make [place] aware they are starting PT and OT for the patient following a hospitalization.

## 2020-05-16 NOTE — Telephone Encounter (Signed)
Okay.  Thanks.

## 2020-05-19 ENCOUNTER — Ambulatory Visit (INDEPENDENT_AMBULATORY_CARE_PROVIDER_SITE_OTHER): Payer: Medicare Other | Admitting: Psychology

## 2020-05-19 DIAGNOSIS — F4489 Other dissociative and conversion disorders: Secondary | ICD-10-CM | POA: Diagnosis not present

## 2020-05-19 DIAGNOSIS — R2681 Unsteadiness on feet: Secondary | ICD-10-CM | POA: Diagnosis not present

## 2020-05-19 DIAGNOSIS — F5109 Other insomnia not due to a substance or known physiological condition: Secondary | ICD-10-CM | POA: Diagnosis not present

## 2020-05-19 DIAGNOSIS — Z79899 Other long term (current) drug therapy: Secondary | ICD-10-CM | POA: Diagnosis not present

## 2020-05-19 DIAGNOSIS — F332 Major depressive disorder, recurrent severe without psychotic features: Secondary | ICD-10-CM

## 2020-05-19 DIAGNOSIS — R110 Nausea: Secondary | ICD-10-CM | POA: Diagnosis not present

## 2020-05-19 DIAGNOSIS — F329 Major depressive disorder, single episode, unspecified: Secondary | ICD-10-CM | POA: Diagnosis not present

## 2020-05-19 DIAGNOSIS — R197 Diarrhea, unspecified: Secondary | ICD-10-CM | POA: Diagnosis not present

## 2020-05-19 DIAGNOSIS — E039 Hypothyroidism, unspecified: Secondary | ICD-10-CM | POA: Diagnosis not present

## 2020-05-19 DIAGNOSIS — R0602 Shortness of breath: Secondary | ICD-10-CM | POA: Diagnosis not present

## 2020-05-20 DIAGNOSIS — F429 Obsessive-compulsive disorder, unspecified: Secondary | ICD-10-CM | POA: Diagnosis not present

## 2020-05-20 DIAGNOSIS — K219 Gastro-esophageal reflux disease without esophagitis: Secondary | ICD-10-CM | POA: Diagnosis not present

## 2020-05-20 DIAGNOSIS — I509 Heart failure, unspecified: Secondary | ICD-10-CM | POA: Diagnosis not present

## 2020-05-20 DIAGNOSIS — I110 Hypertensive heart disease with heart failure: Secondary | ICD-10-CM | POA: Diagnosis not present

## 2020-05-20 DIAGNOSIS — L930 Discoid lupus erythematosus: Secondary | ICD-10-CM | POA: Diagnosis not present

## 2020-05-20 DIAGNOSIS — M8580 Other specified disorders of bone density and structure, unspecified site: Secondary | ICD-10-CM | POA: Diagnosis not present

## 2020-05-20 DIAGNOSIS — E079 Disorder of thyroid, unspecified: Secondary | ICD-10-CM | POA: Diagnosis not present

## 2020-05-20 DIAGNOSIS — Z9013 Acquired absence of bilateral breasts and nipples: Secondary | ICD-10-CM | POA: Diagnosis not present

## 2020-05-20 DIAGNOSIS — Z7901 Long term (current) use of anticoagulants: Secondary | ICD-10-CM | POA: Diagnosis not present

## 2020-05-20 DIAGNOSIS — M1991 Primary osteoarthritis, unspecified site: Secondary | ICD-10-CM | POA: Diagnosis not present

## 2020-05-20 DIAGNOSIS — Z9181 History of falling: Secondary | ICD-10-CM | POA: Diagnosis not present

## 2020-05-20 DIAGNOSIS — F331 Major depressive disorder, recurrent, moderate: Secondary | ICD-10-CM | POA: Diagnosis not present

## 2020-05-20 DIAGNOSIS — F419 Anxiety disorder, unspecified: Secondary | ICD-10-CM | POA: Diagnosis not present

## 2020-05-20 DIAGNOSIS — G4733 Obstructive sleep apnea (adult) (pediatric): Secondary | ICD-10-CM | POA: Diagnosis not present

## 2020-05-20 DIAGNOSIS — K589 Irritable bowel syndrome without diarrhea: Secondary | ICD-10-CM | POA: Diagnosis not present

## 2020-05-20 DIAGNOSIS — H409 Unspecified glaucoma: Secondary | ICD-10-CM | POA: Diagnosis not present

## 2020-05-20 DIAGNOSIS — Z853 Personal history of malignant neoplasm of breast: Secondary | ICD-10-CM | POA: Diagnosis not present

## 2020-05-20 DIAGNOSIS — E039 Hypothyroidism, unspecified: Secondary | ICD-10-CM | POA: Diagnosis not present

## 2020-05-21 DIAGNOSIS — F419 Anxiety disorder, unspecified: Secondary | ICD-10-CM | POA: Diagnosis not present

## 2020-05-21 DIAGNOSIS — F429 Obsessive-compulsive disorder, unspecified: Secondary | ICD-10-CM | POA: Diagnosis not present

## 2020-05-21 DIAGNOSIS — I110 Hypertensive heart disease with heart failure: Secondary | ICD-10-CM | POA: Diagnosis not present

## 2020-05-21 DIAGNOSIS — F331 Major depressive disorder, recurrent, moderate: Secondary | ICD-10-CM | POA: Diagnosis not present

## 2020-05-21 DIAGNOSIS — E039 Hypothyroidism, unspecified: Secondary | ICD-10-CM | POA: Diagnosis not present

## 2020-05-21 DIAGNOSIS — I509 Heart failure, unspecified: Secondary | ICD-10-CM | POA: Diagnosis not present

## 2020-05-22 ENCOUNTER — Other Ambulatory Visit: Payer: Self-pay | Admitting: Internal Medicine

## 2020-05-22 DIAGNOSIS — F411 Generalized anxiety disorder: Secondary | ICD-10-CM

## 2020-05-22 DIAGNOSIS — F330 Major depressive disorder, recurrent, mild: Secondary | ICD-10-CM

## 2020-05-27 DIAGNOSIS — F4489 Other dissociative and conversion disorders: Secondary | ICD-10-CM | POA: Diagnosis not present

## 2020-05-28 DIAGNOSIS — I509 Heart failure, unspecified: Secondary | ICD-10-CM | POA: Diagnosis not present

## 2020-05-28 DIAGNOSIS — F331 Major depressive disorder, recurrent, moderate: Secondary | ICD-10-CM | POA: Diagnosis not present

## 2020-05-28 DIAGNOSIS — F419 Anxiety disorder, unspecified: Secondary | ICD-10-CM | POA: Diagnosis not present

## 2020-05-28 DIAGNOSIS — E039 Hypothyroidism, unspecified: Secondary | ICD-10-CM | POA: Diagnosis not present

## 2020-05-28 DIAGNOSIS — F429 Obsessive-compulsive disorder, unspecified: Secondary | ICD-10-CM | POA: Diagnosis not present

## 2020-05-28 DIAGNOSIS — I110 Hypertensive heart disease with heart failure: Secondary | ICD-10-CM | POA: Diagnosis not present

## 2020-05-29 DIAGNOSIS — I110 Hypertensive heart disease with heart failure: Secondary | ICD-10-CM | POA: Diagnosis not present

## 2020-05-29 DIAGNOSIS — F331 Major depressive disorder, recurrent, moderate: Secondary | ICD-10-CM | POA: Diagnosis not present

## 2020-05-29 DIAGNOSIS — E039 Hypothyroidism, unspecified: Secondary | ICD-10-CM | POA: Diagnosis not present

## 2020-05-29 DIAGNOSIS — F429 Obsessive-compulsive disorder, unspecified: Secondary | ICD-10-CM | POA: Diagnosis not present

## 2020-05-29 DIAGNOSIS — R109 Unspecified abdominal pain: Secondary | ICD-10-CM | POA: Diagnosis not present

## 2020-05-29 DIAGNOSIS — I509 Heart failure, unspecified: Secondary | ICD-10-CM | POA: Diagnosis not present

## 2020-05-29 DIAGNOSIS — F419 Anxiety disorder, unspecified: Secondary | ICD-10-CM | POA: Diagnosis not present

## 2020-06-02 ENCOUNTER — Ambulatory Visit: Payer: Medicare Other | Admitting: Internal Medicine

## 2020-06-02 DIAGNOSIS — F4489 Other dissociative and conversion disorders: Secondary | ICD-10-CM | POA: Diagnosis not present

## 2020-06-02 DIAGNOSIS — R109 Unspecified abdominal pain: Secondary | ICD-10-CM | POA: Diagnosis not present

## 2020-06-02 DIAGNOSIS — E039 Hypothyroidism, unspecified: Secondary | ICD-10-CM | POA: Diagnosis not present

## 2020-06-02 DIAGNOSIS — F331 Major depressive disorder, recurrent, moderate: Secondary | ICD-10-CM | POA: Diagnosis not present

## 2020-06-02 DIAGNOSIS — F5109 Other insomnia not due to a substance or known physiological condition: Secondary | ICD-10-CM | POA: Diagnosis not present

## 2020-06-02 DIAGNOSIS — F329 Major depressive disorder, single episode, unspecified: Secondary | ICD-10-CM | POA: Diagnosis not present

## 2020-06-02 DIAGNOSIS — Z0289 Encounter for other administrative examinations: Secondary | ICD-10-CM

## 2020-06-02 DIAGNOSIS — F419 Anxiety disorder, unspecified: Secondary | ICD-10-CM | POA: Diagnosis not present

## 2020-06-02 DIAGNOSIS — R197 Diarrhea, unspecified: Secondary | ICD-10-CM | POA: Diagnosis not present

## 2020-06-02 DIAGNOSIS — I509 Heart failure, unspecified: Secondary | ICD-10-CM | POA: Diagnosis not present

## 2020-06-02 DIAGNOSIS — Z79899 Other long term (current) drug therapy: Secondary | ICD-10-CM | POA: Diagnosis not present

## 2020-06-02 DIAGNOSIS — F429 Obsessive-compulsive disorder, unspecified: Secondary | ICD-10-CM | POA: Diagnosis not present

## 2020-06-02 DIAGNOSIS — I110 Hypertensive heart disease with heart failure: Secondary | ICD-10-CM | POA: Diagnosis not present

## 2020-06-02 DIAGNOSIS — R2681 Unsteadiness on feet: Secondary | ICD-10-CM | POA: Diagnosis not present

## 2020-06-04 ENCOUNTER — Other Ambulatory Visit: Payer: Self-pay

## 2020-06-05 DIAGNOSIS — I509 Heart failure, unspecified: Secondary | ICD-10-CM | POA: Diagnosis not present

## 2020-06-05 DIAGNOSIS — F419 Anxiety disorder, unspecified: Secondary | ICD-10-CM | POA: Diagnosis not present

## 2020-06-05 DIAGNOSIS — F429 Obsessive-compulsive disorder, unspecified: Secondary | ICD-10-CM | POA: Diagnosis not present

## 2020-06-05 DIAGNOSIS — E039 Hypothyroidism, unspecified: Secondary | ICD-10-CM | POA: Diagnosis not present

## 2020-06-05 DIAGNOSIS — I110 Hypertensive heart disease with heart failure: Secondary | ICD-10-CM | POA: Diagnosis not present

## 2020-06-05 DIAGNOSIS — A159 Respiratory tuberculosis unspecified: Secondary | ICD-10-CM | POA: Diagnosis not present

## 2020-06-05 DIAGNOSIS — F331 Major depressive disorder, recurrent, moderate: Secondary | ICD-10-CM | POA: Diagnosis not present

## 2020-06-09 DIAGNOSIS — F331 Major depressive disorder, recurrent, moderate: Secondary | ICD-10-CM | POA: Diagnosis not present

## 2020-06-09 DIAGNOSIS — I509 Heart failure, unspecified: Secondary | ICD-10-CM | POA: Diagnosis not present

## 2020-06-09 DIAGNOSIS — F429 Obsessive-compulsive disorder, unspecified: Secondary | ICD-10-CM | POA: Diagnosis not present

## 2020-06-09 DIAGNOSIS — F419 Anxiety disorder, unspecified: Secondary | ICD-10-CM | POA: Diagnosis not present

## 2020-06-09 DIAGNOSIS — I110 Hypertensive heart disease with heart failure: Secondary | ICD-10-CM | POA: Diagnosis not present

## 2020-06-09 DIAGNOSIS — E039 Hypothyroidism, unspecified: Secondary | ICD-10-CM | POA: Diagnosis not present

## 2020-06-11 ENCOUNTER — Telehealth (HOSPITAL_COMMUNITY): Payer: Medicare Other | Admitting: Psychiatry

## 2020-06-11 ENCOUNTER — Other Ambulatory Visit: Payer: Self-pay

## 2020-06-13 DIAGNOSIS — F429 Obsessive-compulsive disorder, unspecified: Secondary | ICD-10-CM | POA: Diagnosis not present

## 2020-06-13 DIAGNOSIS — I110 Hypertensive heart disease with heart failure: Secondary | ICD-10-CM | POA: Diagnosis not present

## 2020-06-13 DIAGNOSIS — F331 Major depressive disorder, recurrent, moderate: Secondary | ICD-10-CM | POA: Diagnosis not present

## 2020-06-13 DIAGNOSIS — I509 Heart failure, unspecified: Secondary | ICD-10-CM | POA: Diagnosis not present

## 2020-06-13 DIAGNOSIS — E039 Hypothyroidism, unspecified: Secondary | ICD-10-CM | POA: Diagnosis not present

## 2020-06-13 DIAGNOSIS — F419 Anxiety disorder, unspecified: Secondary | ICD-10-CM | POA: Diagnosis not present

## 2020-06-16 ENCOUNTER — Ambulatory Visit: Payer: Medicare Other | Admitting: Psychology

## 2020-06-16 DIAGNOSIS — I110 Hypertensive heart disease with heart failure: Secondary | ICD-10-CM | POA: Diagnosis not present

## 2020-06-16 DIAGNOSIS — F331 Major depressive disorder, recurrent, moderate: Secondary | ICD-10-CM | POA: Diagnosis not present

## 2020-06-16 DIAGNOSIS — I509 Heart failure, unspecified: Secondary | ICD-10-CM | POA: Diagnosis not present

## 2020-06-16 DIAGNOSIS — F429 Obsessive-compulsive disorder, unspecified: Secondary | ICD-10-CM | POA: Diagnosis not present

## 2020-06-16 DIAGNOSIS — E039 Hypothyroidism, unspecified: Secondary | ICD-10-CM | POA: Diagnosis not present

## 2020-06-16 DIAGNOSIS — F419 Anxiety disorder, unspecified: Secondary | ICD-10-CM | POA: Diagnosis not present

## 2020-06-18 DIAGNOSIS — F429 Obsessive-compulsive disorder, unspecified: Secondary | ICD-10-CM | POA: Diagnosis not present

## 2020-06-18 DIAGNOSIS — F331 Major depressive disorder, recurrent, moderate: Secondary | ICD-10-CM | POA: Diagnosis not present

## 2020-06-18 DIAGNOSIS — I509 Heart failure, unspecified: Secondary | ICD-10-CM | POA: Diagnosis not present

## 2020-06-18 DIAGNOSIS — E039 Hypothyroidism, unspecified: Secondary | ICD-10-CM | POA: Diagnosis not present

## 2020-06-18 DIAGNOSIS — F419 Anxiety disorder, unspecified: Secondary | ICD-10-CM | POA: Diagnosis not present

## 2020-06-18 DIAGNOSIS — I110 Hypertensive heart disease with heart failure: Secondary | ICD-10-CM | POA: Diagnosis not present

## 2020-06-19 DIAGNOSIS — M1991 Primary osteoarthritis, unspecified site: Secondary | ICD-10-CM | POA: Diagnosis not present

## 2020-06-19 DIAGNOSIS — E079 Disorder of thyroid, unspecified: Secondary | ICD-10-CM | POA: Diagnosis not present

## 2020-06-19 DIAGNOSIS — L930 Discoid lupus erythematosus: Secondary | ICD-10-CM | POA: Diagnosis not present

## 2020-06-19 DIAGNOSIS — R109 Unspecified abdominal pain: Secondary | ICD-10-CM | POA: Diagnosis not present

## 2020-06-19 DIAGNOSIS — I509 Heart failure, unspecified: Secondary | ICD-10-CM | POA: Diagnosis not present

## 2020-06-19 DIAGNOSIS — Z7901 Long term (current) use of anticoagulants: Secondary | ICD-10-CM | POA: Diagnosis not present

## 2020-06-19 DIAGNOSIS — Z9013 Acquired absence of bilateral breasts and nipples: Secondary | ICD-10-CM | POA: Diagnosis not present

## 2020-06-19 DIAGNOSIS — K219 Gastro-esophageal reflux disease without esophagitis: Secondary | ICD-10-CM | POA: Diagnosis not present

## 2020-06-19 DIAGNOSIS — F429 Obsessive-compulsive disorder, unspecified: Secondary | ICD-10-CM | POA: Diagnosis not present

## 2020-06-19 DIAGNOSIS — H409 Unspecified glaucoma: Secondary | ICD-10-CM | POA: Diagnosis not present

## 2020-06-19 DIAGNOSIS — M8580 Other specified disorders of bone density and structure, unspecified site: Secondary | ICD-10-CM | POA: Diagnosis not present

## 2020-06-19 DIAGNOSIS — Z853 Personal history of malignant neoplasm of breast: Secondary | ICD-10-CM | POA: Diagnosis not present

## 2020-06-19 DIAGNOSIS — F331 Major depressive disorder, recurrent, moderate: Secondary | ICD-10-CM | POA: Diagnosis not present

## 2020-06-19 DIAGNOSIS — G4733 Obstructive sleep apnea (adult) (pediatric): Secondary | ICD-10-CM | POA: Diagnosis not present

## 2020-06-19 DIAGNOSIS — E039 Hypothyroidism, unspecified: Secondary | ICD-10-CM | POA: Diagnosis not present

## 2020-06-19 DIAGNOSIS — Z79899 Other long term (current) drug therapy: Secondary | ICD-10-CM | POA: Diagnosis not present

## 2020-06-19 DIAGNOSIS — F419 Anxiety disorder, unspecified: Secondary | ICD-10-CM | POA: Diagnosis not present

## 2020-06-19 DIAGNOSIS — K589 Irritable bowel syndrome without diarrhea: Secondary | ICD-10-CM | POA: Diagnosis not present

## 2020-06-19 DIAGNOSIS — Z9181 History of falling: Secondary | ICD-10-CM | POA: Diagnosis not present

## 2020-06-19 DIAGNOSIS — F329 Major depressive disorder, single episode, unspecified: Secondary | ICD-10-CM | POA: Diagnosis not present

## 2020-06-19 DIAGNOSIS — R197 Diarrhea, unspecified: Secondary | ICD-10-CM | POA: Diagnosis not present

## 2020-06-19 DIAGNOSIS — I110 Hypertensive heart disease with heart failure: Secondary | ICD-10-CM | POA: Diagnosis not present

## 2020-06-19 DIAGNOSIS — F5109 Other insomnia not due to a substance or known physiological condition: Secondary | ICD-10-CM | POA: Diagnosis not present

## 2020-06-20 DIAGNOSIS — F419 Anxiety disorder, unspecified: Secondary | ICD-10-CM | POA: Diagnosis not present

## 2020-06-20 DIAGNOSIS — F429 Obsessive-compulsive disorder, unspecified: Secondary | ICD-10-CM | POA: Diagnosis not present

## 2020-06-20 DIAGNOSIS — E039 Hypothyroidism, unspecified: Secondary | ICD-10-CM | POA: Diagnosis not present

## 2020-06-20 DIAGNOSIS — F331 Major depressive disorder, recurrent, moderate: Secondary | ICD-10-CM | POA: Diagnosis not present

## 2020-06-20 DIAGNOSIS — I509 Heart failure, unspecified: Secondary | ICD-10-CM | POA: Diagnosis not present

## 2020-06-20 DIAGNOSIS — I110 Hypertensive heart disease with heart failure: Secondary | ICD-10-CM | POA: Diagnosis not present

## 2020-06-23 DIAGNOSIS — I110 Hypertensive heart disease with heart failure: Secondary | ICD-10-CM | POA: Diagnosis not present

## 2020-06-23 DIAGNOSIS — I509 Heart failure, unspecified: Secondary | ICD-10-CM | POA: Diagnosis not present

## 2020-06-23 DIAGNOSIS — F429 Obsessive-compulsive disorder, unspecified: Secondary | ICD-10-CM | POA: Diagnosis not present

## 2020-06-23 DIAGNOSIS — F419 Anxiety disorder, unspecified: Secondary | ICD-10-CM | POA: Diagnosis not present

## 2020-06-23 DIAGNOSIS — F331 Major depressive disorder, recurrent, moderate: Secondary | ICD-10-CM | POA: Diagnosis not present

## 2020-06-23 DIAGNOSIS — E039 Hypothyroidism, unspecified: Secondary | ICD-10-CM | POA: Diagnosis not present

## 2020-06-24 ENCOUNTER — Telehealth: Payer: Self-pay | Admitting: Internal Medicine

## 2020-06-24 NOTE — Telephone Encounter (Addendum)
Colletta Maryland from [facility] requesting Plan of Care order be re-faxed to her at [phone] Order scanned in Media , dated 2/18

## 2020-06-25 NOTE — Telephone Encounter (Signed)
Faxed plan of care to [phone].Marland KitchenJohny Chess

## 2020-06-26 DIAGNOSIS — F419 Anxiety disorder, unspecified: Secondary | ICD-10-CM | POA: Diagnosis not present

## 2020-06-26 DIAGNOSIS — R2681 Unsteadiness on feet: Secondary | ICD-10-CM | POA: Diagnosis not present

## 2020-06-26 DIAGNOSIS — R109 Unspecified abdominal pain: Secondary | ICD-10-CM | POA: Diagnosis not present

## 2020-06-27 NOTE — Telephone Encounter (Signed)
This is the third time faxing, and we received confirmation that faxes went through. Will fax one more time if they do not receive. We will leave at the front desk for pick-up.Marland KitchenJohny Chess

## 2020-06-27 NOTE — Telephone Encounter (Signed)
Please re-fax to [facility]

## 2020-06-30 DIAGNOSIS — I509 Heart failure, unspecified: Secondary | ICD-10-CM | POA: Diagnosis not present

## 2020-06-30 DIAGNOSIS — F331 Major depressive disorder, recurrent, moderate: Secondary | ICD-10-CM | POA: Diagnosis not present

## 2020-06-30 DIAGNOSIS — F419 Anxiety disorder, unspecified: Secondary | ICD-10-CM | POA: Diagnosis not present

## 2020-06-30 DIAGNOSIS — E039 Hypothyroidism, unspecified: Secondary | ICD-10-CM | POA: Diagnosis not present

## 2020-06-30 DIAGNOSIS — F429 Obsessive-compulsive disorder, unspecified: Secondary | ICD-10-CM | POA: Diagnosis not present

## 2020-06-30 DIAGNOSIS — I110 Hypertensive heart disease with heart failure: Secondary | ICD-10-CM | POA: Diagnosis not present

## 2020-07-01 DIAGNOSIS — I110 Hypertensive heart disease with heart failure: Secondary | ICD-10-CM | POA: Diagnosis not present

## 2020-07-01 DIAGNOSIS — F429 Obsessive-compulsive disorder, unspecified: Secondary | ICD-10-CM | POA: Diagnosis not present

## 2020-07-01 DIAGNOSIS — F419 Anxiety disorder, unspecified: Secondary | ICD-10-CM | POA: Diagnosis not present

## 2020-07-01 DIAGNOSIS — F331 Major depressive disorder, recurrent, moderate: Secondary | ICD-10-CM | POA: Diagnosis not present

## 2020-07-01 DIAGNOSIS — I509 Heart failure, unspecified: Secondary | ICD-10-CM | POA: Diagnosis not present

## 2020-07-01 DIAGNOSIS — E039 Hypothyroidism, unspecified: Secondary | ICD-10-CM | POA: Diagnosis not present

## 2020-07-02 NOTE — Telephone Encounter (Signed)
Left all three at the front desk for pick-up.Marland KitchenJohny [name]

## 2020-07-02 NOTE — Telephone Encounter (Signed)
   [facility] requesting to pick up plan of care. Fax still not received

## 2020-07-07 DIAGNOSIS — I509 Heart failure, unspecified: Secondary | ICD-10-CM | POA: Diagnosis not present

## 2020-07-07 DIAGNOSIS — F419 Anxiety disorder, unspecified: Secondary | ICD-10-CM | POA: Diagnosis not present

## 2020-07-07 DIAGNOSIS — F331 Major depressive disorder, recurrent, moderate: Secondary | ICD-10-CM | POA: Diagnosis not present

## 2020-07-07 DIAGNOSIS — F429 Obsessive-compulsive disorder, unspecified: Secondary | ICD-10-CM | POA: Diagnosis not present

## 2020-07-07 DIAGNOSIS — I110 Hypertensive heart disease with heart failure: Secondary | ICD-10-CM | POA: Diagnosis not present

## 2020-07-07 DIAGNOSIS — E039 Hypothyroidism, unspecified: Secondary | ICD-10-CM | POA: Diagnosis not present

## 2020-07-10 DIAGNOSIS — R109 Unspecified abdominal pain: Secondary | ICD-10-CM | POA: Diagnosis not present

## 2020-07-10 DIAGNOSIS — F419 Anxiety disorder, unspecified: Secondary | ICD-10-CM | POA: Diagnosis not present

## 2020-07-10 DIAGNOSIS — F329 Major depressive disorder, single episode, unspecified: Secondary | ICD-10-CM | POA: Diagnosis not present

## 2020-07-10 DIAGNOSIS — E039 Hypothyroidism, unspecified: Secondary | ICD-10-CM | POA: Diagnosis not present

## 2020-07-10 DIAGNOSIS — R2681 Unsteadiness on feet: Secondary | ICD-10-CM | POA: Diagnosis not present

## 2020-07-14 ENCOUNTER — Ambulatory Visit (INDEPENDENT_AMBULATORY_CARE_PROVIDER_SITE_OTHER): Payer: Medicare Other | Admitting: Psychology

## 2020-07-14 DIAGNOSIS — F331 Major depressive disorder, recurrent, moderate: Secondary | ICD-10-CM | POA: Diagnosis not present

## 2020-07-14 DIAGNOSIS — F419 Anxiety disorder, unspecified: Secondary | ICD-10-CM | POA: Diagnosis not present

## 2020-07-14 DIAGNOSIS — I509 Heart failure, unspecified: Secondary | ICD-10-CM | POA: Diagnosis not present

## 2020-07-14 DIAGNOSIS — I110 Hypertensive heart disease with heart failure: Secondary | ICD-10-CM | POA: Diagnosis not present

## 2020-07-14 DIAGNOSIS — F332 Major depressive disorder, recurrent severe without psychotic features: Secondary | ICD-10-CM

## 2020-07-14 DIAGNOSIS — F429 Obsessive-compulsive disorder, unspecified: Secondary | ICD-10-CM | POA: Diagnosis not present

## 2020-07-14 DIAGNOSIS — E039 Hypothyroidism, unspecified: Secondary | ICD-10-CM | POA: Diagnosis not present

## 2020-07-15 ENCOUNTER — Ambulatory Visit (INDEPENDENT_AMBULATORY_CARE_PROVIDER_SITE_OTHER): Payer: Medicare Other

## 2020-07-15 DIAGNOSIS — I495 Sick sinus syndrome: Secondary | ICD-10-CM | POA: Diagnosis not present

## 2020-07-15 LAB — CUP PACEART REMOTE DEVICE CHECK
Battery Remaining Longevity: 136 mo
Battery Voltage: 3.07 V
Brady Statistic AP VP Percent: 31.42 %
Brady Statistic AP VS Percent: 55.21 %
Brady Statistic AS VP Percent: 3.73 %
Brady Statistic AS VS Percent: 9.64 %
Brady Statistic RA Percent Paced: 87.46 %
Brady Statistic RV Percent Paced: 35.16 %
Date Time Interrogation Session: 20220321193813
Implantable Lead Implant Date: 20090527
Implantable Lead Implant Date: 20090527
Implantable Lead Location: 753859
Implantable Lead Location: 753860
Implantable Lead Model: 5076
Implantable Lead Model: 5076
Implantable Pulse Generator Implant Date: 20210322
Lead Channel Impedance Value: 304 Ohm
Lead Channel Impedance Value: 304 Ohm
Lead Channel Impedance Value: 399 Ohm
Lead Channel Impedance Value: 418 Ohm
Lead Channel Pacing Threshold Amplitude: 0.75 V
Lead Channel Pacing Threshold Amplitude: 0.875 V
Lead Channel Pacing Threshold Pulse Width: 0.4 ms
Lead Channel Pacing Threshold Pulse Width: 0.4 ms
Lead Channel Sensing Intrinsic Amplitude: 2.75 mV
Lead Channel Sensing Intrinsic Amplitude: 2.75 mV
Lead Channel Sensing Intrinsic Amplitude: 2.875 mV
Lead Channel Sensing Intrinsic Amplitude: 2.875 mV
Lead Channel Setting Pacing Amplitude: 1.75 V
Lead Channel Setting Pacing Amplitude: 2.5 V
Lead Channel Setting Pacing Pulse Width: 0.4 ms
Lead Channel Setting Sensing Sensitivity: 0.9 mV

## 2020-07-16 DIAGNOSIS — I509 Heart failure, unspecified: Secondary | ICD-10-CM | POA: Diagnosis not present

## 2020-07-16 DIAGNOSIS — F331 Major depressive disorder, recurrent, moderate: Secondary | ICD-10-CM | POA: Diagnosis not present

## 2020-07-16 DIAGNOSIS — F419 Anxiety disorder, unspecified: Secondary | ICD-10-CM | POA: Diagnosis not present

## 2020-07-16 DIAGNOSIS — F429 Obsessive-compulsive disorder, unspecified: Secondary | ICD-10-CM | POA: Diagnosis not present

## 2020-07-16 DIAGNOSIS — E039 Hypothyroidism, unspecified: Secondary | ICD-10-CM | POA: Diagnosis not present

## 2020-07-16 DIAGNOSIS — I110 Hypertensive heart disease with heart failure: Secondary | ICD-10-CM | POA: Diagnosis not present

## 2020-07-19 DIAGNOSIS — L930 Discoid lupus erythematosus: Secondary | ICD-10-CM | POA: Diagnosis not present

## 2020-07-19 DIAGNOSIS — F419 Anxiety disorder, unspecified: Secondary | ICD-10-CM | POA: Diagnosis not present

## 2020-07-19 DIAGNOSIS — K589 Irritable bowel syndrome without diarrhea: Secondary | ICD-10-CM | POA: Diagnosis not present

## 2020-07-19 DIAGNOSIS — Z9181 History of falling: Secondary | ICD-10-CM | POA: Diagnosis not present

## 2020-07-19 DIAGNOSIS — I509 Heart failure, unspecified: Secondary | ICD-10-CM | POA: Diagnosis not present

## 2020-07-19 DIAGNOSIS — K219 Gastro-esophageal reflux disease without esophagitis: Secondary | ICD-10-CM | POA: Diagnosis not present

## 2020-07-19 DIAGNOSIS — Z7901 Long term (current) use of anticoagulants: Secondary | ICD-10-CM | POA: Diagnosis not present

## 2020-07-19 DIAGNOSIS — F331 Major depressive disorder, recurrent, moderate: Secondary | ICD-10-CM | POA: Diagnosis not present

## 2020-07-19 DIAGNOSIS — M8580 Other specified disorders of bone density and structure, unspecified site: Secondary | ICD-10-CM | POA: Diagnosis not present

## 2020-07-19 DIAGNOSIS — F429 Obsessive-compulsive disorder, unspecified: Secondary | ICD-10-CM | POA: Diagnosis not present

## 2020-07-19 DIAGNOSIS — H409 Unspecified glaucoma: Secondary | ICD-10-CM | POA: Diagnosis not present

## 2020-07-19 DIAGNOSIS — Z853 Personal history of malignant neoplasm of breast: Secondary | ICD-10-CM | POA: Diagnosis not present

## 2020-07-19 DIAGNOSIS — E079 Disorder of thyroid, unspecified: Secondary | ICD-10-CM | POA: Diagnosis not present

## 2020-07-19 DIAGNOSIS — M1991 Primary osteoarthritis, unspecified site: Secondary | ICD-10-CM | POA: Diagnosis not present

## 2020-07-19 DIAGNOSIS — E039 Hypothyroidism, unspecified: Secondary | ICD-10-CM | POA: Diagnosis not present

## 2020-07-19 DIAGNOSIS — I110 Hypertensive heart disease with heart failure: Secondary | ICD-10-CM | POA: Diagnosis not present

## 2020-07-19 DIAGNOSIS — Z9013 Acquired absence of bilateral breasts and nipples: Secondary | ICD-10-CM | POA: Diagnosis not present

## 2020-07-19 DIAGNOSIS — G4733 Obstructive sleep apnea (adult) (pediatric): Secondary | ICD-10-CM | POA: Diagnosis not present

## 2020-07-19 DIAGNOSIS — I10 Essential (primary) hypertension: Secondary | ICD-10-CM | POA: Diagnosis not present

## 2020-07-21 DIAGNOSIS — F429 Obsessive-compulsive disorder, unspecified: Secondary | ICD-10-CM | POA: Diagnosis not present

## 2020-07-21 DIAGNOSIS — E039 Hypothyroidism, unspecified: Secondary | ICD-10-CM | POA: Diagnosis not present

## 2020-07-21 DIAGNOSIS — I110 Hypertensive heart disease with heart failure: Secondary | ICD-10-CM | POA: Diagnosis not present

## 2020-07-21 DIAGNOSIS — I509 Heart failure, unspecified: Secondary | ICD-10-CM | POA: Diagnosis not present

## 2020-07-21 DIAGNOSIS — F331 Major depressive disorder, recurrent, moderate: Secondary | ICD-10-CM | POA: Diagnosis not present

## 2020-07-21 DIAGNOSIS — F419 Anxiety disorder, unspecified: Secondary | ICD-10-CM | POA: Diagnosis not present

## 2020-07-23 NOTE — Progress Notes (Signed)
Remote pacemaker transmission.   

## 2020-07-24 DIAGNOSIS — F419 Anxiety disorder, unspecified: Secondary | ICD-10-CM | POA: Diagnosis not present

## 2020-07-24 DIAGNOSIS — I110 Hypertensive heart disease with heart failure: Secondary | ICD-10-CM | POA: Diagnosis not present

## 2020-07-24 DIAGNOSIS — F429 Obsessive-compulsive disorder, unspecified: Secondary | ICD-10-CM | POA: Diagnosis not present

## 2020-07-24 DIAGNOSIS — F331 Major depressive disorder, recurrent, moderate: Secondary | ICD-10-CM | POA: Diagnosis not present

## 2020-07-24 DIAGNOSIS — E039 Hypothyroidism, unspecified: Secondary | ICD-10-CM | POA: Diagnosis not present

## 2020-07-24 DIAGNOSIS — I509 Heart failure, unspecified: Secondary | ICD-10-CM | POA: Diagnosis not present

## 2020-07-28 ENCOUNTER — Ambulatory Visit: Payer: Medicare Other | Admitting: Podiatry

## 2020-07-28 DIAGNOSIS — I110 Hypertensive heart disease with heart failure: Secondary | ICD-10-CM | POA: Diagnosis not present

## 2020-07-28 DIAGNOSIS — F329 Major depressive disorder, single episode, unspecified: Secondary | ICD-10-CM | POA: Diagnosis not present

## 2020-07-28 DIAGNOSIS — R109 Unspecified abdominal pain: Secondary | ICD-10-CM | POA: Diagnosis not present

## 2020-07-28 DIAGNOSIS — I509 Heart failure, unspecified: Secondary | ICD-10-CM | POA: Diagnosis not present

## 2020-07-28 DIAGNOSIS — F419 Anxiety disorder, unspecified: Secondary | ICD-10-CM | POA: Diagnosis not present

## 2020-07-28 DIAGNOSIS — F429 Obsessive-compulsive disorder, unspecified: Secondary | ICD-10-CM | POA: Diagnosis not present

## 2020-07-28 DIAGNOSIS — F331 Major depressive disorder, recurrent, moderate: Secondary | ICD-10-CM | POA: Diagnosis not present

## 2020-07-28 DIAGNOSIS — E039 Hypothyroidism, unspecified: Secondary | ICD-10-CM | POA: Diagnosis not present

## 2020-07-31 ENCOUNTER — Other Ambulatory Visit: Payer: Self-pay

## 2020-07-31 ENCOUNTER — Ambulatory Visit (INDEPENDENT_AMBULATORY_CARE_PROVIDER_SITE_OTHER): Payer: Medicare Other | Admitting: Gastroenterology

## 2020-07-31 DIAGNOSIS — R748 Abnormal levels of other serum enzymes: Secondary | ICD-10-CM

## 2020-07-31 DIAGNOSIS — E039 Hypothyroidism, unspecified: Secondary | ICD-10-CM | POA: Diagnosis not present

## 2020-07-31 DIAGNOSIS — I110 Hypertensive heart disease with heart failure: Secondary | ICD-10-CM | POA: Diagnosis not present

## 2020-07-31 DIAGNOSIS — I509 Heart failure, unspecified: Secondary | ICD-10-CM | POA: Diagnosis not present

## 2020-07-31 DIAGNOSIS — R1011 Right upper quadrant pain: Secondary | ICD-10-CM | POA: Diagnosis not present

## 2020-07-31 DIAGNOSIS — R1084 Generalized abdominal pain: Secondary | ICD-10-CM | POA: Diagnosis not present

## 2020-07-31 DIAGNOSIS — F331 Major depressive disorder, recurrent, moderate: Secondary | ICD-10-CM | POA: Diagnosis not present

## 2020-07-31 DIAGNOSIS — F419 Anxiety disorder, unspecified: Secondary | ICD-10-CM | POA: Diagnosis not present

## 2020-07-31 DIAGNOSIS — F429 Obsessive-compulsive disorder, unspecified: Secondary | ICD-10-CM | POA: Diagnosis not present

## 2020-07-31 MED ORDER — PANTOPRAZOLE SODIUM 40 MG PO TBEC: 40.0000 mg | DELAYED_RELEASE_TABLET | Freq: Every day | ORAL | 0 refills | Status: DC

## 2020-07-31 NOTE — Progress Notes (Signed)
[name] [address]  [place], [place] [address]  Main: [phone]  Fax: [phone]   Gastroenterology Consultation  Referring Provider:     Cassandria Anger, MD Primary Care Physician:  Cassandria Anger, MD Reason for Consultation:     Abdominal pain        HPI:    Chief Complaint  Patient presents with  . Abdominal Pain    [name] is a 87 y.o. y/o female referred for consultation & management  by Dr. Alain Marion, Evie Lacks, MD.  Patient is quite tangential about her history, and obtaining a reliable history is difficult.  She presents today with staff from [facility] where she resides.  She reports she has had abdominal pain for several years.  After much attempt to try to elicit recent or current symptoms, patient reports constant epigastric abdominal pain/discomfort, with heartburn as well.  No dysphagia.  No vomiting.  No altered bowel habits.  Reports 1 soft bowel movement daily with no blood.  Patient was previously seen by Dr. Oletta Lamas during an inpatient consult for diverticulitis and has prior his notes, patient has previously been seen by Dr. Earlean Shawl for IBS and has had colonoscopies with him before.  It appears in 2013 she had a colonoscopy, procedure report not available to me  Past Medical History:  Diagnosis Date  . Anxiety   . Bradycardia   . Breast cancer, left breast ([facility]) 1995  . Breast cancer, right breast ([facility]) 2002  . CHF (congestive heart failure) ([facility])   . Depression    Dr Cheryln Manly  . Discoid lupus    skin  . Full dentures   . GERD (gastroesophageal reflux disease)   . Glaucoma   . Gout   . Hx of echocardiogram    a.  Echocardiogram (03/2011): Mild LVH, EF 59-74%, grade 1 diastolic dysfunction, mild MR, mild to moderate TR, PASP 42;  b. Echo (05/2013):  Mod LVH, EF 65-70%, no WMA, Gr 1 DD, mild MR, mod TR, PASP 39  . Hx of exercise stress test    a. ETT-echocardiogram (06/08/11): Sub-optimal exercise. Test  stopped early due to dizziness and hypotension. EF 60%.  Non-diagnostic.;   b. Lexiscan Myoview (06/2013):  Diaphragmatic attenuation, no ischemia, EF 61%.  Low Risk  . Hypertension   . Hypothyroidism   . IBS (irritable bowel syndrome)   . Incontinence   . OSA (obstructive sleep apnea) 07/20/2016   Mild with AHI 12/hr now on CPAP  . Osteoarthritis   . Osteopenia   . Pacemaker 2009   Brady//Chronotropic incompetence with normal pacemaker function  . Thyroid disease   . Wears hearing aid    both ears    Past Surgical History:  Procedure Laterality Date  . BREAST BIOPSY Left 1995  . BREAST LUMPECTOMY Left 1995   axillary node dissectoin  . CARDIAC CATHETERIZATION N/A 02/13/2016   Procedure: Right Heart Cath;  Surgeon: Larey Dresser, MD;  Location: [facility];  Service: Cardiovascular;  Laterality: N/A;  . CATARACT EXTRACTION W/ INTRAOCULAR LENS  IMPLANT, BILATERAL Bilateral 06/2009 - 12.2915   left - right  . DORSAL COMPARTMENT RELEASE  2000   left  . HAMMER TOE SURGERY Left 2004  . HEMORRHOID BANDING    . INSERT / REPLACE / REMOVE PACEMAKER  2009  . KNEE ARTHROSCOPY Left 08/29/2012   Procedure: LEFT KNEE ARTHROSCOPY ;  Surgeon: Hessie Dibble, MD;  Location: [facility];  Service: Orthopedics;  Laterality: Left;  [name] MASTECTOMY Bilateral 09/2000   nbx  . PPM GENERATOR CHANGEOUT N/A 07/16/2019   Procedure: PPM GENERATOR CHANGEOUT;  Surgeon: Deboraha Sprang, MD;  Location: [facility];  Service: Cardiovascular;  Laterality: N/A;  . TRIGGER FINGER RELEASE Right 07/31/2013   Procedure: EXCISE MASS RIGHT INDEX A-1 PLLLEY RELEASE A-1 RIGHT INDEX;  Surgeon: Cammie Sickle., MD;  Location: [facility];  Service: Orthopedics;  Laterality: Right;  [name] VAGINAL HYSTERECTOMY      Prior to Admission medications   Medication Sig Start Date End Date Taking? Authorizing Provider  apixaban (ELIQUIS) 5 MG TABS tablet Take 1 tablet (5 mg total) by mouth 2  (two) times daily. Annual appt is due must see provider for future refills 10/10/19   Plotnikov, Evie Lacks, MD  cetirizine (ZYRTEC) 10 MG tablet Take 1 tablet (10 mg total) by mouth daily. 10/09/19   Plotnikov, Evie Lacks, MD  clotrimazole-betamethasone (LOTRISONE) cream Apply 1 application topically in the morning and at bedtime.  06/27/19   [provider]  escitalopram (LEXAPRO) 10 MG tablet TAKE 1 TABLET AT BEDTIME 05/22/20   Plotnikov, Evie Lacks, MD  famotidine (PEPCID) 10 MG tablet Take 1 tablet (10 mg total) by mouth 2 (two) times daily. 05/14/20   Salley Scarlet, MD  levothyroxine (SYNTHROID) 100 MCG tablet Take 1 tablet (100 mcg total) by mouth daily at 6 (six) AM. 05/15/20   Salley Scarlet, MD  LORazepam (ATIVAN) 0.5 MG tablet Take 1 tablet (0.5 mg total) by mouth daily. 05/14/20   Salley Scarlet, MD  magnesium oxide (MAG-OX) 400 MG tablet Take 0.5 tablets (200 mg total) by mouth daily. Needs appt for further refills 05/14/20   Salley Scarlet, MD  pantoprazole (PROTONIX) 40 MG tablet Take 1 tablet (40 mg total) by mouth daily. 07/31/20   Virgel Manifold, MD  REFRESH TEARS 0.5 % SOLN Place 1 drop into both eyes daily as needed (dry eyes).  02/07/17   [provider]  spironolactone (ALDACTONE) 25 MG tablet Take 1 tablet (25 mg total) by mouth daily. 05/14/20   Salley Scarlet, MD  tolterodine (DETROL LA) 2 MG 24 hr capsule Take 1 capsule (2 mg total) by mouth daily. 12/17/19   Plotnikov, Evie Lacks, MD  traZODone (DESYREL) 100 MG tablet Take 1 tablet (100 mg total) by mouth at bedtime as needed for sleep. 05/14/20   Salley Scarlet, MD    Family History  Problem Relation Age of Onset  . Ovarian cancer Mother   . Heart disease Father   . Stroke Sister   . Stroke Brother   . Cancer Brother   . Heart disease Brother      Social History   Tobacco Use  . Smoking status: Never Smoker  . Smokeless tobacco: Never Used  Vaping Use  . Vaping Use: Never used  Substance  Use Topics  . Alcohol use: No    Alcohol/week: 0.0 standard drinks  . Drug use: No    Allergies as of 07/31/2020 - Review Complete 05/09/2020  Allergen Reaction Noted  . Lactase Diarrhea 04/09/2015  . Amlodipine besy-benazepril hcl Swelling   . Amlodipine besylate Swelling   . Clonidine hydrochloride  10/04/2007  . Lactose intolerance (gi) Diarrhea and Other (See Comments) 08/24/2012  . Valsartan Other (See Comments)   . Verapamil Other (See Comments)     Review of Systems:    All systems reviewed and  negative except where noted in HPI.   Physical Exam:  There were no vitals taken for this visit. No LMP recorded. Patient has had a hysterectomy. Psych:  Alert and cooperative. Normal mood and affect. General:   Alert,  Well-developed, well-nourished, pleasant and cooperative in NAD Head:  Normocephalic and atraumatic. Eyes:  Sclera clear, no icterus.   Conjunctiva pink. Ears:  Normal auditory acuity. Nose:  No deformity, discharge, or lesions. Mouth:  No deformity or lesions,oropharynx pink & moist. Neck:  Supple; no masses or thyromegaly. Abdomen:  Normal bowel sounds.  No bruits.  Soft, tender to palpation midepigastric region, and non-distended without masses, hepatosplenomegaly or hernias noted.  No guarding or rebound tenderness.    Msk:  Symmetrical without gross deformities. Good, equal movement & strength bilaterally. Pulses:  Normal pulses noted. Extremities:  No clubbing or edema.  No cyanosis. Neurologic:  Alert and oriented x3;  grossly normal neurologically. Skin:  Intact without significant lesions or rashes. No jaundice. Lymph Nodes:  No significant cervical adenopathy. Psych:  Alert and cooperative. Normal mood and affect.   Labs: CBC    Component Value Date/Time   WBC 2.8 (L) 05/09/2020 1151   RBC 3.86 (L) 05/09/2020 1151   HGB 12.8 05/09/2020 1151   HGB 14.0 07/12/2019 1139   HGB 13.7 10/30/2008 1506   HCT 38.4 05/09/2020 1151   HCT 42.1 07/12/2019  1139   HCT 40.0 10/30/2008 1506   PLT 162 05/09/2020 1151   PLT 176 07/12/2019 1139   MCV 99.5 05/09/2020 1151   MCV 100 (H) 07/12/2019 1139   MCV 95.6 10/30/2008 1506   MCH 33.2 05/09/2020 1151   MCHC 33.3 05/09/2020 1151   RDW 14.1 05/09/2020 1151   RDW 13.6 07/12/2019 1139   RDW 13.9 10/30/2008 1506   LYMPHSABS 0.6 (L) 07/25/2019 1519   LYMPHSABS 1.7 10/30/2008 1506   MONOABS 0.4 07/25/2019 1519   MONOABS 0.8 10/30/2008 1506   EOSABS 0.0 07/25/2019 1519   EOSABS 0.1 10/30/2008 1506   BASOSABS 0.0 07/25/2019 1519   BASOSABS 0.0 10/30/2008 1506   CMP     Component Value Date/Time   NA 145 05/09/2020 1151   NA 140 07/12/2019 1139   K 3.8 05/09/2020 1151   CL 105 05/09/2020 1151   CO2 26 05/09/2020 1151   GLUCOSE 85 05/09/2020 1151   BUN 31 (H) 05/09/2020 1151   BUN 40 (H) 07/12/2019 1139   CREATININE 1.16 (H) 05/09/2020 1151   CREATININE 1.27 (H) 07/07/2018 1144   CALCIUM 9.8 05/09/2020 1151   PROT 7.3 05/09/2020 1151   ALBUMIN 3.9 05/09/2020 1151   AST 21 05/09/2020 1151   AST 18 07/07/2018 1144   ALT 13 05/09/2020 1151   ALT 10 07/07/2018 1144   ALKPHOS 51 05/09/2020 1151   BILITOT 1.6 (H) 05/09/2020 1151   BILITOT 0.8 07/07/2018 1144   GFRNONAA 45 (L) 05/09/2020 1151   GFRNONAA 38 (L) 07/07/2018 1144   GFRAA 46 (L) 07/25/2019 1519   GFRAA 44 (L) 07/07/2018 1144   Scanned labs reviewed and show mildly elevated bilirubin in January and February 2022 and prior to that as well  Imaging Studies: CUP PACEART REMOTE DEVICE CHECK  Result Date: 07/15/2020 Scheduled remote reviewed. Normal device function.  2 AMS, 10 min. 2 SVT/ST, longest < 1 min. 4 NSVT, longest 6 beats Next remote 91 days- JBox/CVRS   Assessment and Plan:   Lyniah Fujita is a 87 y.o. y/o female has been referred for abdominal pain  Patient's history is not quite clear as she is very tangential when asked questions  Given her elevation in total bilirubin, I will obtain fractionation at this  time, especially since the location of her pain is in the epigastric region.  Also obtain right upper quadrant ultrasound.  Obtain H. pylori breath test  Patient is specifically asking for something to help her symptoms as she states she has abdominal discomfort that keeps her up at night.  Will start PPI until we await the above tests  Close follow up in clinic   Dr [name]  Speech recognition software was used to dictate the above note.

## 2020-08-02 LAB — HEPATIC FUNCTION PANEL
ALT: 8 IU/L (ref 0–32)
AST: 18 IU/L (ref 0–40)
Albumin: 4.2 g/dL (ref 3.6–4.6)
Alkaline Phosphatase: 57 IU/L (ref 44–121)
Bilirubin Total: 0.7 mg/dL (ref 0.0–1.2)
Bilirubin, Direct: 0.2 mg/dL (ref 0.00–0.40)
Total Protein: 6.8 g/dL (ref 6.0–8.5)

## 2020-08-02 LAB — H. PYLORI BREATH TEST: H pylori Breath Test: NEGATIVE

## 2020-08-05 DIAGNOSIS — F429 Obsessive-compulsive disorder, unspecified: Secondary | ICD-10-CM | POA: Diagnosis not present

## 2020-08-05 DIAGNOSIS — I110 Hypertensive heart disease with heart failure: Secondary | ICD-10-CM | POA: Diagnosis not present

## 2020-08-05 DIAGNOSIS — I509 Heart failure, unspecified: Secondary | ICD-10-CM | POA: Diagnosis not present

## 2020-08-05 DIAGNOSIS — F331 Major depressive disorder, recurrent, moderate: Secondary | ICD-10-CM | POA: Diagnosis not present

## 2020-08-05 DIAGNOSIS — E039 Hypothyroidism, unspecified: Secondary | ICD-10-CM | POA: Diagnosis not present

## 2020-08-05 DIAGNOSIS — F419 Anxiety disorder, unspecified: Secondary | ICD-10-CM | POA: Diagnosis not present

## 2020-08-06 DIAGNOSIS — F419 Anxiety disorder, unspecified: Secondary | ICD-10-CM | POA: Diagnosis not present

## 2020-08-06 DIAGNOSIS — F331 Major depressive disorder, recurrent, moderate: Secondary | ICD-10-CM | POA: Diagnosis not present

## 2020-08-06 DIAGNOSIS — I110 Hypertensive heart disease with heart failure: Secondary | ICD-10-CM | POA: Diagnosis not present

## 2020-08-06 DIAGNOSIS — I509 Heart failure, unspecified: Secondary | ICD-10-CM | POA: Diagnosis not present

## 2020-08-06 DIAGNOSIS — F429 Obsessive-compulsive disorder, unspecified: Secondary | ICD-10-CM | POA: Diagnosis not present

## 2020-08-06 DIAGNOSIS — E039 Hypothyroidism, unspecified: Secondary | ICD-10-CM | POA: Diagnosis not present

## 2020-08-11 ENCOUNTER — Ambulatory Visit: Payer: Medicare Other | Admitting: Psychology

## 2020-08-11 DIAGNOSIS — E039 Hypothyroidism, unspecified: Secondary | ICD-10-CM | POA: Diagnosis not present

## 2020-08-11 DIAGNOSIS — F5109 Other insomnia not due to a substance or known physiological condition: Secondary | ICD-10-CM | POA: Diagnosis not present

## 2020-08-11 DIAGNOSIS — F419 Anxiety disorder, unspecified: Secondary | ICD-10-CM | POA: Diagnosis not present

## 2020-08-11 DIAGNOSIS — F329 Major depressive disorder, single episode, unspecified: Secondary | ICD-10-CM | POA: Diagnosis not present

## 2020-08-13 DIAGNOSIS — F429 Obsessive-compulsive disorder, unspecified: Secondary | ICD-10-CM | POA: Diagnosis not present

## 2020-08-13 DIAGNOSIS — E039 Hypothyroidism, unspecified: Secondary | ICD-10-CM | POA: Diagnosis not present

## 2020-08-13 DIAGNOSIS — I110 Hypertensive heart disease with heart failure: Secondary | ICD-10-CM | POA: Diagnosis not present

## 2020-08-13 DIAGNOSIS — F419 Anxiety disorder, unspecified: Secondary | ICD-10-CM | POA: Diagnosis not present

## 2020-08-13 DIAGNOSIS — I509 Heart failure, unspecified: Secondary | ICD-10-CM | POA: Diagnosis not present

## 2020-08-13 DIAGNOSIS — F331 Major depressive disorder, recurrent, moderate: Secondary | ICD-10-CM | POA: Diagnosis not present

## 2020-08-14 DIAGNOSIS — F4322 Adjustment disorder with anxiety: Secondary | ICD-10-CM | POA: Diagnosis not present

## 2020-08-14 DIAGNOSIS — E039 Hypothyroidism, unspecified: Secondary | ICD-10-CM | POA: Diagnosis not present

## 2020-08-14 DIAGNOSIS — F5109 Other insomnia not due to a substance or known physiological condition: Secondary | ICD-10-CM | POA: Diagnosis not present

## 2020-08-14 DIAGNOSIS — F331 Major depressive disorder, recurrent, moderate: Secondary | ICD-10-CM | POA: Diagnosis not present

## 2020-08-14 DIAGNOSIS — F329 Major depressive disorder, single episode, unspecified: Secondary | ICD-10-CM | POA: Diagnosis not present

## 2020-08-14 DIAGNOSIS — I509 Heart failure, unspecified: Secondary | ICD-10-CM | POA: Diagnosis not present

## 2020-08-14 DIAGNOSIS — F429 Obsessive-compulsive disorder, unspecified: Secondary | ICD-10-CM | POA: Diagnosis not present

## 2020-08-14 DIAGNOSIS — I110 Hypertensive heart disease with heart failure: Secondary | ICD-10-CM | POA: Diagnosis not present

## 2020-08-14 DIAGNOSIS — F422 Mixed obsessional thoughts and acts: Secondary | ICD-10-CM | POA: Diagnosis not present

## 2020-08-14 DIAGNOSIS — F419 Anxiety disorder, unspecified: Secondary | ICD-10-CM | POA: Diagnosis not present

## 2020-08-18 DIAGNOSIS — Z853 Personal history of malignant neoplasm of breast: Secondary | ICD-10-CM | POA: Diagnosis not present

## 2020-08-18 DIAGNOSIS — I110 Hypertensive heart disease with heart failure: Secondary | ICD-10-CM | POA: Diagnosis not present

## 2020-08-18 DIAGNOSIS — E079 Disorder of thyroid, unspecified: Secondary | ICD-10-CM | POA: Diagnosis not present

## 2020-08-18 DIAGNOSIS — F331 Major depressive disorder, recurrent, moderate: Secondary | ICD-10-CM | POA: Diagnosis not present

## 2020-08-18 DIAGNOSIS — K219 Gastro-esophageal reflux disease without esophagitis: Secondary | ICD-10-CM | POA: Diagnosis not present

## 2020-08-18 DIAGNOSIS — M8580 Other specified disorders of bone density and structure, unspecified site: Secondary | ICD-10-CM | POA: Diagnosis not present

## 2020-08-18 DIAGNOSIS — K589 Irritable bowel syndrome without diarrhea: Secondary | ICD-10-CM | POA: Diagnosis not present

## 2020-08-18 DIAGNOSIS — Z7901 Long term (current) use of anticoagulants: Secondary | ICD-10-CM | POA: Diagnosis not present

## 2020-08-18 DIAGNOSIS — Z9013 Acquired absence of bilateral breasts and nipples: Secondary | ICD-10-CM | POA: Diagnosis not present

## 2020-08-18 DIAGNOSIS — I509 Heart failure, unspecified: Secondary | ICD-10-CM | POA: Diagnosis not present

## 2020-08-18 DIAGNOSIS — F419 Anxiety disorder, unspecified: Secondary | ICD-10-CM | POA: Diagnosis not present

## 2020-08-18 DIAGNOSIS — H409 Unspecified glaucoma: Secondary | ICD-10-CM | POA: Diagnosis not present

## 2020-08-18 DIAGNOSIS — Z9181 History of falling: Secondary | ICD-10-CM | POA: Diagnosis not present

## 2020-08-18 DIAGNOSIS — M1991 Primary osteoarthritis, unspecified site: Secondary | ICD-10-CM | POA: Diagnosis not present

## 2020-08-18 DIAGNOSIS — E039 Hypothyroidism, unspecified: Secondary | ICD-10-CM | POA: Diagnosis not present

## 2020-08-18 DIAGNOSIS — G4733 Obstructive sleep apnea (adult) (pediatric): Secondary | ICD-10-CM | POA: Diagnosis not present

## 2020-08-18 DIAGNOSIS — L930 Discoid lupus erythematosus: Secondary | ICD-10-CM | POA: Diagnosis not present

## 2020-08-18 DIAGNOSIS — F429 Obsessive-compulsive disorder, unspecified: Secondary | ICD-10-CM | POA: Diagnosis not present

## 2020-08-18 DIAGNOSIS — I10 Essential (primary) hypertension: Secondary | ICD-10-CM | POA: Diagnosis not present

## 2020-08-21 DIAGNOSIS — E039 Hypothyroidism, unspecified: Secondary | ICD-10-CM | POA: Diagnosis not present

## 2020-08-25 ENCOUNTER — Ambulatory Visit: Payer: Medicare Other | Admitting: Podiatry

## 2020-08-25 DIAGNOSIS — I509 Heart failure, unspecified: Secondary | ICD-10-CM | POA: Diagnosis not present

## 2020-08-25 DIAGNOSIS — F331 Major depressive disorder, recurrent, moderate: Secondary | ICD-10-CM | POA: Diagnosis not present

## 2020-08-25 DIAGNOSIS — F419 Anxiety disorder, unspecified: Secondary | ICD-10-CM | POA: Diagnosis not present

## 2020-08-25 DIAGNOSIS — E039 Hypothyroidism, unspecified: Secondary | ICD-10-CM | POA: Diagnosis not present

## 2020-08-25 DIAGNOSIS — I110 Hypertensive heart disease with heart failure: Secondary | ICD-10-CM | POA: Diagnosis not present

## 2020-08-25 DIAGNOSIS — F429 Obsessive-compulsive disorder, unspecified: Secondary | ICD-10-CM | POA: Diagnosis not present

## 2020-08-27 DIAGNOSIS — M1991 Primary osteoarthritis, unspecified site: Secondary | ICD-10-CM | POA: Diagnosis not present

## 2020-08-27 DIAGNOSIS — Z9013 Acquired absence of bilateral breasts and nipples: Secondary | ICD-10-CM

## 2020-08-27 DIAGNOSIS — I10 Essential (primary) hypertension: Secondary | ICD-10-CM

## 2020-08-27 DIAGNOSIS — K589 Irritable bowel syndrome without diarrhea: Secondary | ICD-10-CM | POA: Diagnosis not present

## 2020-08-27 DIAGNOSIS — E039 Hypothyroidism, unspecified: Secondary | ICD-10-CM | POA: Diagnosis not present

## 2020-08-27 DIAGNOSIS — E079 Disorder of thyroid, unspecified: Secondary | ICD-10-CM

## 2020-08-27 DIAGNOSIS — F331 Major depressive disorder, recurrent, moderate: Secondary | ICD-10-CM | POA: Diagnosis not present

## 2020-08-27 DIAGNOSIS — K219 Gastro-esophageal reflux disease without esophagitis: Secondary | ICD-10-CM

## 2020-08-27 DIAGNOSIS — I110 Hypertensive heart disease with heart failure: Secondary | ICD-10-CM | POA: Diagnosis not present

## 2020-08-27 DIAGNOSIS — H409 Unspecified glaucoma: Secondary | ICD-10-CM

## 2020-08-27 DIAGNOSIS — F419 Anxiety disorder, unspecified: Secondary | ICD-10-CM | POA: Diagnosis not present

## 2020-08-27 DIAGNOSIS — M8580 Other specified disorders of bone density and structure, unspecified site: Secondary | ICD-10-CM

## 2020-08-27 DIAGNOSIS — I509 Heart failure, unspecified: Secondary | ICD-10-CM | POA: Diagnosis not present

## 2020-08-27 DIAGNOSIS — L930 Discoid lupus erythematosus: Secondary | ICD-10-CM | POA: Diagnosis not present

## 2020-08-27 DIAGNOSIS — G4733 Obstructive sleep apnea (adult) (pediatric): Secondary | ICD-10-CM | POA: Diagnosis not present

## 2020-08-27 DIAGNOSIS — Z853 Personal history of malignant neoplasm of breast: Secondary | ICD-10-CM

## 2020-08-27 DIAGNOSIS — Z9181 History of falling: Secondary | ICD-10-CM

## 2020-08-27 DIAGNOSIS — Z7901 Long term (current) use of anticoagulants: Secondary | ICD-10-CM

## 2020-08-27 DIAGNOSIS — F429 Obsessive-compulsive disorder, unspecified: Secondary | ICD-10-CM

## 2020-08-28 DIAGNOSIS — F5109 Other insomnia not due to a substance or known physiological condition: Secondary | ICD-10-CM | POA: Diagnosis not present

## 2020-08-28 DIAGNOSIS — F329 Major depressive disorder, single episode, unspecified: Secondary | ICD-10-CM | POA: Diagnosis not present

## 2020-08-28 DIAGNOSIS — F4322 Adjustment disorder with anxiety: Secondary | ICD-10-CM | POA: Diagnosis not present

## 2020-09-01 DIAGNOSIS — R634 Abnormal weight loss: Secondary | ICD-10-CM | POA: Diagnosis not present

## 2020-09-01 DIAGNOSIS — E039 Hypothyroidism, unspecified: Secondary | ICD-10-CM | POA: Diagnosis not present

## 2020-09-01 DIAGNOSIS — H538 Other visual disturbances: Secondary | ICD-10-CM | POA: Diagnosis not present

## 2020-09-01 DIAGNOSIS — F419 Anxiety disorder, unspecified: Secondary | ICD-10-CM | POA: Diagnosis not present

## 2020-09-01 DIAGNOSIS — R630 Anorexia: Secondary | ICD-10-CM | POA: Diagnosis not present

## 2020-09-01 DIAGNOSIS — F329 Major depressive disorder, single episode, unspecified: Secondary | ICD-10-CM | POA: Diagnosis not present

## 2020-09-01 DIAGNOSIS — R030 Elevated blood-pressure reading, without diagnosis of hypertension: Secondary | ICD-10-CM | POA: Diagnosis not present

## 2020-09-02 DIAGNOSIS — I509 Heart failure, unspecified: Secondary | ICD-10-CM | POA: Diagnosis not present

## 2020-09-02 DIAGNOSIS — I110 Hypertensive heart disease with heart failure: Secondary | ICD-10-CM | POA: Diagnosis not present

## 2020-09-02 DIAGNOSIS — F331 Major depressive disorder, recurrent, moderate: Secondary | ICD-10-CM | POA: Diagnosis not present

## 2020-09-02 DIAGNOSIS — E039 Hypothyroidism, unspecified: Secondary | ICD-10-CM | POA: Diagnosis not present

## 2020-09-02 DIAGNOSIS — F429 Obsessive-compulsive disorder, unspecified: Secondary | ICD-10-CM | POA: Diagnosis not present

## 2020-09-02 DIAGNOSIS — F419 Anxiety disorder, unspecified: Secondary | ICD-10-CM | POA: Diagnosis not present

## 2020-09-03 ENCOUNTER — Other Ambulatory Visit: Payer: Self-pay

## 2020-09-03 ENCOUNTER — Ambulatory Visit (INDEPENDENT_AMBULATORY_CARE_PROVIDER_SITE_OTHER): Payer: Medicare Other | Admitting: Gastroenterology

## 2020-09-03 ENCOUNTER — Encounter: Payer: Self-pay | Admitting: Gastroenterology

## 2020-09-03 VITALS — BP 138/80 | HR 80 | Temp 98.4°F | Ht 64.0 in | Wt 115.0 lb

## 2020-09-03 DIAGNOSIS — Z20828 Contact with and (suspected) exposure to other viral communicable diseases: Secondary | ICD-10-CM | POA: Diagnosis not present

## 2020-09-03 DIAGNOSIS — R109 Unspecified abdominal pain: Secondary | ICD-10-CM

## 2020-09-03 NOTE — Progress Notes (Signed)
Vonda Antigua, MD [address]  [place]  [place], [place] [address]  Main: [phone]  Fax: [phone]   Primary Care Physician: Cassandria Anger, MD   Chief complaint: Abdominal pain  HPI: [name] is a 87 y.o. female who lives at an assisted living facility, here for follow-up of abdominal pain.  Patient is reporting constipation and states she has very small bowel movements daily.  Her medication list from the facility shows that she is on psyllium husk daily, 1 mg as needed and she says she is not taking it as needed.  She remains tangential about her symptoms.  No nausea or vomiting.  It appears she has had abdominal pain dating back to atleast 2020 as CT Scan was done at that time that did not show any acute findings.   Current Outpatient Medications  Medication Sig Dispense Refill  . cetirizine (ZYRTEC) 10 MG tablet Take 1 tablet (10 mg total) by mouth daily. 100 tablet 3  . clotrimazole-betamethasone (LOTRISONE) cream Apply 1 application topically in the morning and at bedtime.    [name] escitalopram (LEXAPRO) 10 MG tablet TAKE 1 TABLET AT BEDTIME 90 tablet 3  . famotidine (PEPCID) 10 MG tablet Take 1 tablet (10 mg total) by mouth 2 (two) times daily. 60 tablet 1  . levothyroxine (SYNTHROID) 100 MCG tablet Take 1 tablet (100 mcg total) by mouth daily at 6 (six) AM. 30 tablet 1  . LORazepam (ATIVAN) 0.5 MG tablet Take 1 tablet (0.5 mg total) by mouth daily. 30 tablet 1  . pantoprazole (PROTONIX) 40 MG tablet Take 1 tablet (40 mg total) by mouth daily. 30 tablet 0  . REFRESH TEARS 0.5 % SOLN Place 1 drop into both eyes daily as needed (dry eyes).  0  . tolterodine (DETROL LA) 2 MG 24 hr capsule Take 1 capsule (2 mg total) by mouth daily. 90 capsule 3  . traZODone (DESYREL) 100 MG tablet Take 1 tablet (100 mg total) by mouth at bedtime as needed for sleep. 30 tablet 1  . apixaban (ELIQUIS) 5 MG TABS tablet Take 1 tablet (5 mg total) by mouth 2 (two) times  daily. Annual appt is due must see provider for future refills (Patient not taking: Reported on 09/03/2020) 10 tablet 1  . magnesium oxide (MAG-OX) 400 MG tablet Take 0.5 tablets (200 mg total) by mouth daily. Needs appt for further refills (Patient not taking: Reported on 09/03/2020) 45 tablet 0  . spironolactone (ALDACTONE) 25 MG tablet Take 1 tablet (25 mg total) by mouth daily. (Patient not taking: Reported on 09/03/2020) 90 tablet 1   No current facility-administered medications for this visit.    Allergies as of 09/03/2020 - Review Complete 05/09/2020  Allergen Reaction Noted  . Lactase Diarrhea 04/09/2015  . Amlodipine besy-benazepril hcl Swelling   . Amlodipine besylate Swelling   . Clonidine hydrochloride  10/04/2007  . Lactose intolerance (gi) Diarrhea and Other (See Comments) 08/24/2012  . Valsartan Other (See Comments)   . Verapamil Other (See Comments)     ROS:  General: Negative for anorexia, weight loss, fever, chills, fatigue, weakness. ENT: Negative for hoarseness, difficulty swallowing , nasal congestion. CV: Negative for chest pain, angina, palpitations, dyspnea on exertion, peripheral edema.  Respiratory: Negative for dyspnea at rest, dyspnea on exertion, cough, sputum, wheezing.  GI: See history of present illness. GU:  Negative for dysuria, hematuria, urinary incontinence, urinary frequency, nocturnal urination.  Endo: Negative for unusual weight change.    Physical  Examination:   BP 138/80   Pulse 80   Temp 98.4 F (36.9 C) (Oral)   Ht 5\' 4"  (1.626 m)   Wt 115 lb (52.2 kg)   BMI 19.74 kg/m   General: Well-nourished, well-developed in no acute distress.  Eyes: No icterus. Conjunctivae pink. Mouth: Oropharyngeal mucosa moist and pink , no lesions erythema or exudate. Neck: Supple, Trachea midline Abdomen: Bowel sounds are normal, nontender, nondistended, no hepatosplenomegaly or masses, no abdominal bruits or hernia , no rebound or guarding.    Extremities: No lower extremity edema. No clubbing or deformities. Neuro: Alert and oriented x 3.  Grossly intact. Skin: Warm and dry, no jaundice.   Psych: Alert and cooperative, normal mood and affect.   Labs: CMP     Component Value Date/Time   NA 145 05/09/2020 1151   NA 140 07/12/2019 1139   K 3.8 05/09/2020 1151   CL 105 05/09/2020 1151   CO2 26 05/09/2020 1151   GLUCOSE 85 05/09/2020 1151   BUN 31 (H) 05/09/2020 1151   BUN 40 (H) 07/12/2019 1139   CREATININE 1.16 (H) 05/09/2020 1151   CREATININE 1.27 (H) 07/07/2018 1144   CALCIUM 9.8 05/09/2020 1151   PROT 6.8 07/31/2020 1519   ALBUMIN 4.2 07/31/2020 1519   AST 18 07/31/2020 1519   AST 18 07/07/2018 1144   ALT 8 07/31/2020 1519   ALT 10 07/07/2018 1144   ALKPHOS 57 07/31/2020 1519   BILITOT 0.7 07/31/2020 1519   BILITOT 0.8 07/07/2018 1144   GFRNONAA 45 (L) 05/09/2020 1151   GFRNONAA 38 (L) 07/07/2018 1144   GFRAA 46 (L) 07/25/2019 1519   GFRAA 44 (L) 07/07/2018 1144   Lab Results  Component Value Date   WBC 2.8 (L) 05/09/2020   HGB 12.8 05/09/2020   HCT 38.4 05/09/2020   MCV 99.5 05/09/2020   PLT 162 05/09/2020    Imaging Studies: No results found.  Assessment and Plan:   [name] is a 87 y.o. y/o female here for follow-up of abdominal pain  Right upper quadrant ultrasound has been scheduled for May 23 and is pending at this time.  Will await results and if negative, obtain CT scan  In the meantime, it appears patient is constipated despite taking psyllium husk daily as noted on her medication list from the facility.  DC psyllium husk and start MiraLAX daily, once a day and hold for 1 day for loose stools.  These instructions were written and sent with the patient  F/u in 4-6 weeks  PPI has helped with her heartburn but she continues to report epigastric abdominal pain. Some of her symptoms are likely due to her underlying anxiety and adjustment disorder as well.   Dr Vonda Antigua

## 2020-09-08 ENCOUNTER — Ambulatory Visit (INDEPENDENT_AMBULATORY_CARE_PROVIDER_SITE_OTHER): Payer: Medicare Other | Admitting: Psychology

## 2020-09-08 DIAGNOSIS — F331 Major depressive disorder, recurrent, moderate: Secondary | ICD-10-CM

## 2020-09-15 ENCOUNTER — Other Ambulatory Visit: Payer: Self-pay

## 2020-09-15 ENCOUNTER — Ambulatory Visit
Admission: RE | Admit: 2020-09-15 | Discharge: 2020-09-15 | Disposition: A | Discharge status: Home / Self Care | Payer: Medicare Other | Source: Ambulatory Visit | Attending: Gastroenterology | Admitting: Gastroenterology

## 2020-09-15 DIAGNOSIS — R1011 Right upper quadrant pain: Secondary | ICD-10-CM | POA: Diagnosis not present

## 2020-09-15 DIAGNOSIS — K8020 Calculus of gallbladder without cholecystitis without obstruction: Secondary | ICD-10-CM | POA: Diagnosis not present

## 2020-09-15 DIAGNOSIS — I509 Heart failure, unspecified: Secondary | ICD-10-CM | POA: Diagnosis not present

## 2020-09-15 DIAGNOSIS — E039 Hypothyroidism, unspecified: Secondary | ICD-10-CM | POA: Diagnosis not present

## 2020-09-15 DIAGNOSIS — F331 Major depressive disorder, recurrent, moderate: Secondary | ICD-10-CM | POA: Diagnosis not present

## 2020-09-15 DIAGNOSIS — F429 Obsessive-compulsive disorder, unspecified: Secondary | ICD-10-CM | POA: Diagnosis not present

## 2020-09-15 DIAGNOSIS — F419 Anxiety disorder, unspecified: Secondary | ICD-10-CM | POA: Diagnosis not present

## 2020-09-15 DIAGNOSIS — I110 Hypertensive heart disease with heart failure: Secondary | ICD-10-CM | POA: Diagnosis not present

## 2020-09-16 ENCOUNTER — Telehealth: Payer: Self-pay

## 2020-09-16 DIAGNOSIS — R109 Unspecified abdominal pain: Secondary | ICD-10-CM

## 2020-09-16 DIAGNOSIS — R634 Abnormal weight loss: Secondary | ICD-10-CM

## 2020-09-16 NOTE — Telephone Encounter (Signed)
Called patient's daughter-POA and I had to leave her a voicemail to return our call. I will also send a MyChart message and hopefully they will call us back. We need to inform them that patient needs a CT Scan of the abdomen and pelvis without contrast. They also need to know that they need to pick up an oral contrast before having the CT done. Patient is to not eat or drink anything 4 hours prior to.

## 2020-09-16 NOTE — Telephone Encounter (Signed)
-----   Message from Virgel Manifold, MD sent at 09/16/2020  9:17 AM EDT ----- Her Ultrasound showed stones in the gallbladder but there is no associated abnormalities in the gallbladder to explain her pain. Please proceed with CT Abdomen/pelvis without contrast for abdominal pain and weight loss.

## 2020-09-18 ENCOUNTER — Telehealth: Payer: Self-pay | Admitting: Gastroenterology

## 2020-09-18 NOTE — Telephone Encounter (Signed)
Patient called stating she is having GI issues.  "Backed Up" and now thinks her colon is coming out her rectum when she is wiping.  [place], LVM for [facility] to call us back to discuss patient's GI concerns.  I advised patient to communicate with CNA there and possibly go to ER.  Patient states that she has told "the lady that gives her meds" and nothing is being done.

## 2020-09-24 DIAGNOSIS — K5900 Constipation, unspecified: Secondary | ICD-10-CM | POA: Diagnosis not present

## 2020-09-24 DIAGNOSIS — G4700 Insomnia, unspecified: Secondary | ICD-10-CM | POA: Diagnosis not present

## 2020-09-24 DIAGNOSIS — F419 Anxiety disorder, unspecified: Secondary | ICD-10-CM | POA: Diagnosis not present

## 2020-09-24 DIAGNOSIS — E039 Hypothyroidism, unspecified: Secondary | ICD-10-CM | POA: Diagnosis not present

## 2020-09-24 DIAGNOSIS — F329 Major depressive disorder, single episode, unspecified: Secondary | ICD-10-CM | POA: Diagnosis not present

## 2020-09-24 DIAGNOSIS — H538 Other visual disturbances: Secondary | ICD-10-CM | POA: Diagnosis not present

## 2020-09-24 DIAGNOSIS — R630 Anorexia: Secondary | ICD-10-CM | POA: Diagnosis not present

## 2020-09-25 ENCOUNTER — Ambulatory Visit: Admission: RE | Admit: 2020-09-25 | Payer: Medicare Other | Source: Ambulatory Visit

## 2020-09-25 DIAGNOSIS — F329 Major depressive disorder, single episode, unspecified: Secondary | ICD-10-CM | POA: Diagnosis not present

## 2020-09-25 DIAGNOSIS — F419 Anxiety disorder, unspecified: Secondary | ICD-10-CM | POA: Diagnosis not present

## 2020-09-25 DIAGNOSIS — F422 Mixed obsessional thoughts and acts: Secondary | ICD-10-CM | POA: Diagnosis not present

## 2020-09-25 DIAGNOSIS — F4322 Adjustment disorder with anxiety: Secondary | ICD-10-CM | POA: Diagnosis not present

## 2020-09-25 DIAGNOSIS — F5109 Other insomnia not due to a substance or known physiological condition: Secondary | ICD-10-CM | POA: Diagnosis not present

## 2020-09-26 ENCOUNTER — Ambulatory Visit (INDEPENDENT_AMBULATORY_CARE_PROVIDER_SITE_OTHER): Payer: Medicare Other | Admitting: Psychology

## 2020-09-26 DIAGNOSIS — F332 Major depressive disorder, recurrent severe without psychotic features: Secondary | ICD-10-CM

## 2020-09-30 ENCOUNTER — Telehealth: Payer: Self-pay | Admitting: Gastroenterology

## 2020-09-30 NOTE — Telephone Encounter (Signed)
Called patient back again to check up on her and had to leave a voicemail. I hope she calls back.

## 2020-09-30 NOTE — Telephone Encounter (Signed)
Patient would like to discuss her IBS meds.

## 2020-10-01 NOTE — Telephone Encounter (Signed)
Called pt, no answer and VM not set up 

## 2020-10-06 ENCOUNTER — Ambulatory Visit: Payer: Medicare Other | Admitting: Psychology

## 2020-10-06 NOTE — Telephone Encounter (Signed)
Called pt, no answer and VM not set up 

## 2020-10-09 DIAGNOSIS — F419 Anxiety disorder, unspecified: Secondary | ICD-10-CM | POA: Diagnosis not present

## 2020-10-09 DIAGNOSIS — F329 Major depressive disorder, single episode, unspecified: Secondary | ICD-10-CM | POA: Diagnosis not present

## 2020-10-09 DIAGNOSIS — F422 Mixed obsessional thoughts and acts: Secondary | ICD-10-CM | POA: Diagnosis not present

## 2020-10-09 DIAGNOSIS — F4322 Adjustment disorder with anxiety: Secondary | ICD-10-CM | POA: Diagnosis not present

## 2020-10-13 ENCOUNTER — Ambulatory Visit (INDEPENDENT_AMBULATORY_CARE_PROVIDER_SITE_OTHER): Payer: Medicare Other | Admitting: Psychology

## 2020-10-13 DIAGNOSIS — F331 Major depressive disorder, recurrent, moderate: Secondary | ICD-10-CM | POA: Diagnosis not present

## 2020-10-14 ENCOUNTER — Other Ambulatory Visit: Payer: Self-pay

## 2020-10-14 ENCOUNTER — Ambulatory Visit (INDEPENDENT_AMBULATORY_CARE_PROVIDER_SITE_OTHER): Payer: Medicare Other | Admitting: Gastroenterology

## 2020-10-14 ENCOUNTER — Ambulatory Visit (INDEPENDENT_AMBULATORY_CARE_PROVIDER_SITE_OTHER): Payer: Medicare Other

## 2020-10-14 VITALS — BP 110/71 | HR 61 | Temp 97.8°F | Wt 110.2 lb

## 2020-10-14 DIAGNOSIS — R109 Unspecified abdominal pain: Secondary | ICD-10-CM

## 2020-10-14 DIAGNOSIS — I495 Sick sinus syndrome: Secondary | ICD-10-CM | POA: Diagnosis not present

## 2020-10-14 LAB — CUP PACEART REMOTE DEVICE CHECK
Battery Remaining Longevity: 132 mo
Battery Voltage: 3.04 V
Brady Statistic AP VP Percent: 49.62 %
Brady Statistic AP VS Percent: 42.88 %
Brady Statistic AS VP Percent: 3.1 %
Brady Statistic AS VS Percent: 4.41 %
Brady Statistic RA Percent Paced: 94.74 %
Brady Statistic RV Percent Paced: 52.71 %
Date Time Interrogation Session: 20220621052307
Implantable Lead Implant Date: 20090527
Implantable Lead Implant Date: 20090527
Implantable Lead Location: 753859
Implantable Lead Location: 753860
Implantable Lead Model: 5076
Implantable Lead Model: 5076
Implantable Pulse Generator Implant Date: 20210322
Lead Channel Impedance Value: 304 Ohm
Lead Channel Impedance Value: 323 Ohm
Lead Channel Impedance Value: 418 Ohm
Lead Channel Impedance Value: 437 Ohm
Lead Channel Pacing Threshold Amplitude: 0.5 V
Lead Channel Pacing Threshold Amplitude: 0.875 V
Lead Channel Pacing Threshold Pulse Width: 0.4 ms
Lead Channel Pacing Threshold Pulse Width: 0.4 ms
Lead Channel Sensing Intrinsic Amplitude: 3 mV
Lead Channel Sensing Intrinsic Amplitude: 3 mV
Lead Channel Sensing Intrinsic Amplitude: 3.25 mV
Lead Channel Sensing Intrinsic Amplitude: 3.25 mV
Lead Channel Setting Pacing Amplitude: 1.75 V
Lead Channel Setting Pacing Amplitude: 2.5 V
Lead Channel Setting Pacing Pulse Width: 0.4 ms
Lead Channel Setting Sensing Sensitivity: 0.9 mV

## 2020-10-14 MED ORDER — POLYETHYLENE GLYCOL 3350 17 G PO PACK: 17.0000 g | PACK | Freq: Every day | ORAL | 0 refills | Status: AC

## 2020-10-14 NOTE — Progress Notes (Addendum)
[name] Antigua, MD [address]  [place]  [place], [place] [address]  Main: [phone]  Fax: [phone]   Primary Care Physician: Cassandria Anger, MD    HPI: [name] is a 87 y.o. female here for follow-up of abdominal pain that she states continues.  Patient lives at [facility], and there are no staff members present with her today to help provide history.  Patient continues to report bilateral lower quadrant dull abdominal pain, 5/10.  Nonradiating.  No nausea or vomiting.  Patient is reporting 2 soft bowel movements daily.  MiraLAX was recommended on last visit.  Despite this, patient continues to have abdominal pain that has not improved.  She remains tangential about her history and has to be redirected.    CT scan was ordered on previous visit and was scheduled.  On further investigation to see why these have not been done, it appears patient was a no-show for her appointments.  Please note that patient is brought to her appointments with transport from [facility] so unclear why she was a no-show for her CT scans.  Her medication list has been sent over from her facility, but there are no medical notes that accompany her to provide further details of her symptoms there.  Medication list from the facility was reviewed.  Patient constantly asks about her lorazepam and I discussed with her that I have never prescribed her lorazepam and therefore she will need to ask her primary care provider or provider at her facility about this.  I do not see lorazepam on her medication list either.  Right upper quadrant ultrasound on May 23 showed cholelithiasis, without cholecystitis.  H. pylori breath test negative.  Liver enzymes normal  Previous history: Patient was previously seen by Dr. Oletta Lamas during an inpatient consult for diverticulitis and as per his prior notes, patient has previously been seen by Dr. Earlean Shawl for IBS and has had colonoscopies with him before.   It appears in 2013 she had a colonoscopy, procedure report not available to me  It appears she has had abdominal pain dating back to atleast 2020 as CT Scan was done at that time that did not show any acute findings.  ROS: All ROS reviewed and negative except as per HPI   Past Medical History:  Diagnosis Date   Anxiety    Bradycardia    Breast cancer, left breast ([facility]) 1995   Breast cancer, right breast ([facility]) 2002   CHF (congestive heart failure) (HCC)    Depression    Dr Cheryln Manly   Discoid lupus    skin   Full dentures    GERD (gastroesophageal reflux disease)    Glaucoma    Gout    Hx of echocardiogram    a.  Echocardiogram (03/2011): Mild LVH, EF 62-22%, grade 1 diastolic dysfunction, mild MR, mild to moderate TR, PASP 42;  b. Echo (05/2013):  Mod LVH, EF 65-70%, no WMA, Gr 1 DD, mild MR, mod TR, PASP 39   Hx of exercise stress test    a. ETT-echocardiogram (06/08/11): Sub-optimal exercise. Test stopped early due to dizziness and hypotension. EF 60%.  Non-diagnostic.;   b. Lexiscan Myoview (06/2013):  Diaphragmatic attenuation, no ischemia, EF 61%.  Low Risk   Hypertension    Hypothyroidism    IBS (irritable bowel syndrome)    Incontinence    OSA (obstructive sleep apnea) 07/20/2016   Mild with AHI 12/hr now on CPAP   Osteoarthritis  Osteopenia    Pacemaker 2009   Brady//Chronotropic incompetence with normal pacemaker function   Thyroid disease    Wears hearing aid    both ears    Past Surgical History:  Procedure Laterality Date   BREAST BIOPSY Left 1995   BREAST LUMPECTOMY Left 1995   axillary node dissectoin   CARDIAC CATHETERIZATION N/A 02/13/2016   Procedure: Right Heart Cath;  Surgeon: Larey Dresser, MD;  Location: [facility];  Service: Cardiovascular;  Laterality: N/A;   CATARACT EXTRACTION W/ INTRAOCULAR LENS  IMPLANT, BILATERAL Bilateral 06/2009 - 12.2915   left - right   DORSAL COMPARTMENT RELEASE  2000   left   HAMMER TOE SURGERY Left 2004    HEMORRHOID BANDING     INSERT / REPLACE / REMOVE PACEMAKER  2009   KNEE ARTHROSCOPY Left 08/29/2012   Procedure: LEFT KNEE ARTHROSCOPY ;  Surgeon: Hessie Dibble, MD;  Location: [facility];  Service: Orthopedics;  Laterality: Left;   MASTECTOMY Bilateral 09/2000   nbx   PPM GENERATOR CHANGEOUT N/A 07/16/2019   Procedure: PPM GENERATOR CHANGEOUT;  Surgeon: Deboraha Sprang, MD;  Location: [facility];  Service: Cardiovascular;  Laterality: N/A;   TRIGGER FINGER RELEASE Right 07/31/2013   Procedure: EXCISE MASS RIGHT INDEX A-1 PLLLEY RELEASE A-1 RIGHT INDEX;  Surgeon: Cammie Sickle., MD;  Location: [facility];  Service: Orthopedics;  Laterality: Right;   VAGINAL HYSTERECTOMY      Prior to Admission medications   Medication Sig Start Date End Date Taking? Authorizing Provider  apixaban (ELIQUIS) 5 MG TABS tablet Take 1 tablet (5 mg total) by mouth 2 (two) times daily. Annual appt is due must see provider for future refills 10/10/19  Yes Plotnikov, Evie Lacks, MD  cetirizine (ZYRTEC) 10 MG tablet Take 1 tablet (10 mg total) by mouth daily. 10/09/19  Yes Plotnikov, Evie Lacks, MD  clotrimazole-betamethasone (LOTRISONE) cream Apply 1 application topically in the morning and at bedtime. 06/27/19  Yes [provider]  escitalopram (LEXAPRO) 10 MG tablet TAKE 1 TABLET AT BEDTIME 05/22/20  Yes Plotnikov, Evie Lacks, MD  famotidine (PEPCID) 10 MG tablet Take 1 tablet (10 mg total) by mouth 2 (two) times daily. 05/14/20  Yes Salley Scarlet, MD  levothyroxine (SYNTHROID) 100 MCG tablet Take 1 tablet (100 mcg total) by mouth daily at 6 (six) AM. 05/15/20  Yes Salley Scarlet, MD  LORazepam (ATIVAN) 0.5 MG tablet Take 1 tablet (0.5 mg total) by mouth daily. 05/14/20  Yes Salley Scarlet, MD  magnesium oxide (MAG-OX) 400 MG tablet Take 0.5 tablets (200 mg total) by mouth daily. Needs appt for further refills 05/14/20  Yes Salley Scarlet, MD  pantoprazole (PROTONIX) 40  MG tablet Take 1 tablet (40 mg total) by mouth daily. 07/31/20  Yes Reene Harlacher, Lennette Bihari, MD  REFRESH TEARS 0.5 % SOLN Place 1 drop into both eyes daily as needed (dry eyes). 02/07/17  Yes [provider]  spironolactone (ALDACTONE) 25 MG tablet Take 1 tablet (25 mg total) by mouth daily. 05/14/20  Yes Salley Scarlet, MD  tolterodine (DETROL LA) 2 MG 24 hr capsule Take 1 capsule (2 mg total) by mouth daily. 12/17/19  Yes Plotnikov, Evie Lacks, MD  traZODone (DESYREL) 100 MG tablet Take 1 tablet (100 mg total) by mouth at bedtime as needed for sleep. 05/14/20  Yes Salley Scarlet, MD    Family History  Problem Relation Age of Onset  Ovarian cancer Mother    Heart disease Father    Stroke Sister    Stroke Brother    Cancer Brother    Heart disease Brother      Social History   Tobacco Use   Smoking status: Never   Smokeless tobacco: Never  Vaping Use   Vaping Use: Never used  Substance Use Topics   Alcohol use: No    Alcohol/week: 0.0 standard drinks   Drug use: No    Allergies as of 10/14/2020 - Review Complete 10/14/2020  Allergen Reaction Noted   Lactase Diarrhea 04/09/2015   Amlodipine besy-benazepril hcl Swelling    Amlodipine besylate Swelling    Clonidine hydrochloride  10/04/2007   Lactose intolerance (gi) Diarrhea and Other (See Comments) 08/24/2012   Valsartan Other (See Comments)    Verapamil Other (See Comments)     Physical Examination:   BP 110/71   Pulse 61   Temp 97.8 F (36.6 C) (Oral)   Wt 110 lb 3.2 oz (50 kg)   BMI 18.92 kg/m   Constitutional: General:   Alert,  Well-developed, well-nourished, pleasant and cooperative in NAD BP 110/71   Pulse 61   Temp 97.8 F (36.6 C) (Oral)   Wt 110 lb 3.2 oz (50 kg)   BMI 18.92 kg/m   Eyes:  Sclera clear, no icterus.   Conjunctiva pink. PERRLA  Ears:  No scars, lesions or masses, Normal auditory acuity. Nose:  No deformity, discharge, or lesions. Mouth:  No deformity or lesions, oropharynx  pink & moist.  Neck:  Supple; no masses or thyromegaly.  Respiratory: Normal respiratory effort, Normal percussion  Gastrointestinal:  Normal bowel sounds.  No bruits.  Soft, non-tender and non-distended without masses, hepatosplenomegaly or hernias noted.  No guarding or rebound tenderness.     Cardiac: No clubbing or edema.  No cyanosis. Normal posterior tibial pedal pulses noted.  Lymphatic:  No significant cervical or axillary adenopathy.  Psych:  Alert and cooperative. Normal mood and affect.  Musculoskeletal:  Normal gait. Head normocephalic, atraumatic. Symmetrical without gross deformities. 5/5 Upper and Lower extremity strength bilaterally.  Skin: Warm. Intact without significant lesions or rashes. No jaundice.  Neurologic:  Face symmetrical, tongue midline, Normal sensation to touch;  grossly normal neurologically.  Psych:  Alert and oriented x3, Alert and cooperative. Normal mood and affect.  Labs: CMP     Component Value Date/Time   NA 145 05/09/2020 1151   NA 140 07/12/2019 1139   K 3.8 05/09/2020 1151   CL 105 05/09/2020 1151   CO2 26 05/09/2020 1151   GLUCOSE 85 05/09/2020 1151   BUN 31 (H) 05/09/2020 1151   BUN 40 (H) 07/12/2019 1139   CREATININE 1.16 (H) 05/09/2020 1151   CREATININE 1.27 (H) 07/07/2018 1144   CALCIUM 9.8 05/09/2020 1151   PROT 6.8 07/31/2020 1519   ALBUMIN 4.2 07/31/2020 1519   AST 18 07/31/2020 1519   AST 18 07/07/2018 1144   ALT 8 07/31/2020 1519   ALT 10 07/07/2018 1144   ALKPHOS 57 07/31/2020 1519   BILITOT 0.7 07/31/2020 1519   BILITOT 0.8 07/07/2018 1144   GFRNONAA 45 (L) 05/09/2020 1151   GFRNONAA 38 (L) 07/07/2018 1144   GFRAA 46 (L) 07/25/2019 1519   GFRAA 44 (L) 07/07/2018 1144   Lab Results  Component Value Date   WBC 2.8 (L) 05/09/2020   HGB 12.8 05/09/2020   HCT 38.4 05/09/2020   MCV 99.5 05/09/2020   PLT 162 05/09/2020  Imaging Studies: Right upper quadrant ultrasound reviewed as per HPI H. pylori breath  test - April 2022 Liver enzymes normal April 2022   Assessment and Plan:   [name] is a 87 y.o. y/o female here for follow-up of abdominal pain  Patient is need of CT abdomen and unclear why she was a no-show given that this has been ordered before.  We have rescheduled it again today and it is now scheduled for October 28, 2020.  We have printed out these instructions and given it to the patient and also are sending a copy of this back with her to the facility.  In the form that I have filled out for the facility today, I also wrote this down and requested them to call the radiology number if they are unable to go to the scheduled appointment, to reschedule it for another time  Patient will need close follow-up in clinic given ongoing symptoms and delay in CT scan due to previous no-show  Follow-up in 4 weeks  Continue MiraLAX in the meantime as it has improved her bowel movements as she was having small bowel movements before and now is having 2 formed bowel movements a day.  Over 50% of the visit spent in counseling patient and coordination of care  Dr [name]

## 2020-10-14 NOTE — Patient Instructions (Addendum)
CT Scan abdomen/pelvis scheduled for July 5th Tuesday arrival 11:15am. Nothing by mouth 4 hours prior  Will need to pick up prep prior to day of appointment at address below   [facility]  [address] [place], [place], [place] [address] [phone]   Please schedule 4 week follow up

## 2020-10-14 NOTE — Addendum Note (Signed)
Addended by: Vonda Antigua on: 10/14/2020 04:24 PM   Modules accepted: Orders

## 2020-10-20 DIAGNOSIS — R001 Bradycardia, unspecified: Secondary | ICD-10-CM | POA: Diagnosis not present

## 2020-10-20 DIAGNOSIS — R1084 Generalized abdominal pain: Secondary | ICD-10-CM | POA: Diagnosis not present

## 2020-10-20 DIAGNOSIS — E039 Hypothyroidism, unspecified: Secondary | ICD-10-CM | POA: Diagnosis not present

## 2020-10-20 DIAGNOSIS — Z0000 Encounter for general adult medical examination without abnormal findings: Secondary | ICD-10-CM | POA: Diagnosis not present

## 2020-10-20 DIAGNOSIS — R2681 Unsteadiness on feet: Secondary | ICD-10-CM | POA: Diagnosis not present

## 2020-10-20 DIAGNOSIS — F419 Anxiety disorder, unspecified: Secondary | ICD-10-CM | POA: Diagnosis not present

## 2020-10-28 ENCOUNTER — Ambulatory Visit: Admission: RE | Admit: 2020-10-28 | Payer: Medicare Other | Source: Ambulatory Visit

## 2020-11-03 ENCOUNTER — Ambulatory Visit: Payer: Medicare Other | Admitting: Psychology

## 2020-11-03 NOTE — Progress Notes (Signed)
Remote pacemaker transmission.   

## 2020-11-06 ENCOUNTER — Other Ambulatory Visit: Payer: Self-pay

## 2020-11-06 ENCOUNTER — Ambulatory Visit
Admission: RE | Admit: 2020-11-06 | Discharge: 2020-11-06 | Disposition: A | Discharge status: Home / Self Care | Payer: Medicare Other | Source: Ambulatory Visit | Attending: Gastroenterology | Admitting: Gastroenterology

## 2020-11-06 DIAGNOSIS — F422 Mixed obsessional thoughts and acts: Secondary | ICD-10-CM | POA: Diagnosis not present

## 2020-11-06 DIAGNOSIS — G301 Alzheimer's disease with late onset: Secondary | ICD-10-CM | POA: Diagnosis not present

## 2020-11-06 DIAGNOSIS — R109 Unspecified abdominal pain: Secondary | ICD-10-CM | POA: Diagnosis not present

## 2020-11-06 DIAGNOSIS — I700 Atherosclerosis of aorta: Secondary | ICD-10-CM | POA: Diagnosis not present

## 2020-11-06 DIAGNOSIS — R634 Abnormal weight loss: Secondary | ICD-10-CM

## 2020-11-06 DIAGNOSIS — K8020 Calculus of gallbladder without cholecystitis without obstruction: Secondary | ICD-10-CM | POA: Diagnosis not present

## 2020-11-06 DIAGNOSIS — K5730 Diverticulosis of large intestine without perforation or abscess without bleeding: Secondary | ICD-10-CM | POA: Diagnosis not present

## 2020-11-06 DIAGNOSIS — F0281 Dementia in other diseases classified elsewhere with behavioral disturbance: Secondary | ICD-10-CM | POA: Diagnosis not present

## 2020-11-06 DIAGNOSIS — F4322 Adjustment disorder with anxiety: Secondary | ICD-10-CM | POA: Diagnosis not present

## 2020-11-10 ENCOUNTER — Telehealth: Payer: Self-pay

## 2020-11-10 ENCOUNTER — Other Ambulatory Visit: Payer: Self-pay

## 2020-11-10 ENCOUNTER — Ambulatory Visit (INDEPENDENT_AMBULATORY_CARE_PROVIDER_SITE_OTHER): Payer: Medicare Other | Admitting: Gastroenterology

## 2020-11-10 VITALS — BP 96/64 | HR 62 | Temp 98.7°F | Ht 64.0 in | Wt 106.6 lb

## 2020-11-10 DIAGNOSIS — R1084 Generalized abdominal pain: Secondary | ICD-10-CM | POA: Diagnosis not present

## 2020-11-10 DIAGNOSIS — R109 Unspecified abdominal pain: Secondary | ICD-10-CM | POA: Diagnosis not present

## 2020-11-10 DIAGNOSIS — R634 Abnormal weight loss: Secondary | ICD-10-CM | POA: Diagnosis not present

## 2020-11-10 NOTE — Progress Notes (Signed)
[name] Antigua, MD [address]  [place]  [place], [place] [address]  Main: [phone]  Fax: [phone]   Primary Care Physician: Cassandria Anger, MD  Chief complaint: Abdominal pain   HPI: Bettyjane Shenoy is a 87 y.o. female here for follow-up of abdominal pain.  Continues to report diffuse abdominal pain, dull, 5/10, despite having soft bowel movements regularly.  She reports worsening of pain with foods but is unable to pinpoint which type of foods.  Is on MiraLAX daily.  Review of her records show that patient's weight was 141 pounds in June or July 2021 and is now 106 pounds.  Patient has lost over 30 pounds in a year.  Describes nausea but no vomiting.  Patient is alert and oriented x3.  Patient is accompanied by staff at her living facility.  She denies any dysphagia.  Her CT scan showed cholelithiasis, colonic diverticulosis, aortic arthrosclerosis and was otherwise reassuring.  Recent labs showed normal hepatic functionRight upper quadrant ultrasound on May 23 showed cholelithiasis, without cholecystitis.  H. pylori breath test negative.  Liver enzymes normal  Previous history: Patient was previously seen by Dr. Oletta Lamas during an inpatient consult for diverticulitis and as per his prior notes, patient has previously been seen by Dr. Earlean Shawl for IBS and has had colonoscopies with him before.  It appears in 2013 she had a colonoscopy, procedure report not available to me  It appears she has had abdominal pain dating back to atleast 2020 as CT Scan was done at that time that did not show any acute findings panel as well.   ROS: All ROS reviewed and negative except as per HPI   Past Medical History:  Diagnosis Date   Anxiety    Bradycardia    Breast cancer, left breast ([facility]) 1995   Breast cancer, right breast ([facility]) 2002   CHF (congestive heart failure) (HCC)    Depression    Dr Cheryln Manly   Discoid lupus    skin   Full dentures    GERD (gastroesophageal  reflux disease)    Glaucoma    Gout    Hx of echocardiogram    a.  Echocardiogram (03/2011): Mild LVH, EF 28-31%, grade 1 diastolic dysfunction, mild MR, mild to moderate TR, PASP 42;  b. Echo (05/2013):  Mod LVH, EF 65-70%, no WMA, Gr 1 DD, mild MR, mod TR, PASP 39   Hx of exercise stress test    a. ETT-echocardiogram (06/08/11): Sub-optimal exercise. Test stopped early due to dizziness and hypotension. EF 60%.  Non-diagnostic.;   b. Lexiscan Myoview (06/2013):  Diaphragmatic attenuation, no ischemia, EF 61%.  Low Risk   Hypertension    Hypothyroidism    IBS (irritable bowel syndrome)    Incontinence    OSA (obstructive sleep apnea) 07/20/2016   Mild with AHI 12/hr now on CPAP   Osteoarthritis    Osteopenia    Pacemaker 2009   Brady//Chronotropic incompetence with normal pacemaker function   Thyroid disease    Wears hearing aid    both ears    Past Surgical History:  Procedure Laterality Date   BREAST BIOPSY Left 1995   BREAST LUMPECTOMY Left 1995   axillary node dissectoin   CARDIAC CATHETERIZATION N/A 02/13/2016   Procedure: Right Heart Cath;  Surgeon: Larey Dresser, MD;  Location: [facility];  Service: Cardiovascular;  Laterality: N/A;   CATARACT EXTRACTION W/ INTRAOCULAR LENS  IMPLANT, BILATERAL Bilateral 06/2009 - 12.2915   left -  right   DORSAL COMPARTMENT RELEASE  2000   left   HAMMER TOE SURGERY Left 2004   HEMORRHOID BANDING     INSERT / REPLACE / REMOVE PACEMAKER  2009   KNEE ARTHROSCOPY Left 08/29/2012   Procedure: LEFT KNEE ARTHROSCOPY ;  Surgeon: Hessie Dibble, MD;  Location: [facility];  Service: Orthopedics;  Laterality: Left;   MASTECTOMY Bilateral 09/2000   nbx   PPM GENERATOR CHANGEOUT N/A 07/16/2019   Procedure: PPM GENERATOR CHANGEOUT;  Surgeon: Deboraha Sprang, MD;  Location: [facility];  Service: Cardiovascular;  Laterality: N/A;   TRIGGER FINGER RELEASE Right 07/31/2013   Procedure: EXCISE MASS RIGHT INDEX A-1 PLLLEY RELEASE  A-1 RIGHT INDEX;  Surgeon: Cammie Sickle., MD;  Location: [facility];  Service: Orthopedics;  Laterality: Right;   VAGINAL HYSTERECTOMY      Prior to Admission medications   Medication Sig Start Date End Date Taking? Authorizing Provider  ALPRAZolam Duanne Moron) 0.5 MG tablet Take 0.5 mg by mouth 2 (two) times daily as needed for anxiety.   Yes [provider]  apixaban (ELIQUIS) 5 MG TABS tablet Take 1 tablet (5 mg total) by mouth 2 (two) times daily. Annual appt is due must see provider for future refills 10/10/19  Yes Plotnikov, Evie Lacks, MD  busPIRone (BUSPAR) 7.5 MG tablet Take 7.5 mg by mouth 2 (two) times daily. 09/05/20  Yes [provider]  cetirizine (ZYRTEC) 10 MG tablet Take 1 tablet (10 mg total) by mouth daily. 10/09/19  Yes Plotnikov, Evie Lacks, MD  clotrimazole-betamethasone (LOTRISONE) cream Apply 1 application topically in the morning and at bedtime. 06/27/19  Yes [provider]  escitalopram (LEXAPRO) 10 MG tablet TAKE 1 TABLET AT BEDTIME 05/22/20  Yes Plotnikov, Evie Lacks, MD  famotidine (PEPCID) 10 MG tablet Take 1 tablet (10 mg total) by mouth 2 (two) times daily. 05/14/20  Yes Salley Scarlet, MD  FLUoxetine (PROZAC) 20 MG capsule Take 20 mg by mouth daily.   Yes [provider]  levothyroxine (SYNTHROID) 100 MCG tablet Take 1 tablet (100 mcg total) by mouth daily at 6 (six) AM. 05/15/20  Yes Salley Scarlet, MD  magnesium oxide (MAG-OX) 400 MG tablet Take 0.5 tablets (200 mg total) by mouth daily. Needs appt for further refills 05/14/20  Yes Salley Scarlet, MD  pantoprazole (PROTONIX) 40 MG tablet Take 1 tablet (40 mg total) by mouth daily. 07/31/20  Yes [name] B, MD  polyethylene glycol (MIRALAX MIX-IN PAX) 17 g packet Take 17 g by mouth daily. 10/14/20 01/12/21 Yes Regan Llorente, Lennette Bihari, MD  REFRESH TEARS 0.5 % SOLN Place 1 drop into both eyes daily as needed (dry eyes). 02/07/17  Yes [provider]   spironolactone (ALDACTONE) 25 MG tablet Take 1 tablet (25 mg total) by mouth daily. 05/14/20  Yes Salley Scarlet, MD  tolterodine (DETROL LA) 2 MG 24 hr capsule Take 1 capsule (2 mg total) by mouth daily. 12/17/19  Yes Plotnikov, Evie Lacks, MD  traZODone (DESYREL) 100 MG tablet Take 1 tablet (100 mg total) by mouth at bedtime as needed for sleep. 05/14/20  Yes [place]ley Scarlet, MD    Family History  Problem Relation Age of Onset   Ovarian cancer Mother    Heart disease Father    Stroke Sister    Stroke Brother    Cancer Brother    Heart disease Brother      Social History  Tobacco Use   Smoking status: Never   Smokeless tobacco: Never  Vaping Use   Vaping Use: Never used  Substance Use Topics   Alcohol use: No    Alcohol/week: 0.0 standard drinks   Drug use: No    Allergies as of 11/10/2020 - Review Complete 11/10/2020  Allergen Reaction Noted   Lactase Diarrhea 04/09/2015   Amlodipine besy-benazepril hcl Swelling    Amlodipine besylate Swelling    Clonidine hydrochloride  10/04/2007   Lactose intolerance (gi) Diarrhea and Other (See Comments) 08/24/2012   Valsartan Other (See Comments)    Verapamil Other (See Comments)     Physical Examination:  Constitutional: General:   Alert,  Well-developed, well-nourished, pleasant and cooperative in NAD BP 96/64   Pulse 62   Temp 98.7 F (37.1 C) (Oral)   Ht 5\' 4"  (1.626 m)   Wt 106 lb 9.6 oz (48.4 kg)   BMI 18.30 kg/m   Respiratory: Normal respiratory effort  Gastrointestinal:  Soft, non-tender and non-distended without masses, hepatosplenomegaly or hernias noted.  No guarding or rebound tenderness.     Cardiac: No clubbing or edema.  No cyanosis. Normal posterior tibial pedal pulses noted.  Psych:  Alert and cooperative. Normal mood and affect.  Musculoskeletal:  Normal gait. Head normocephalic, atraumatic. Symmetrical without gross deformities. 5/5 Lower extremity strength bilaterally.  Skin: Warm. Intact  without significant lesions or rashes. No jaundice.  Neck: Supple, trachea midline  Lymph: No cervical lymphadenopathy  Psych:  Alert and oriented x3, Alert and cooperative. Normal mood and affect.  Labs: CMP     Component Value Date/Time   NA 145 05/09/2020 1151   NA 140 07/12/2019 1139   K 3.8 05/09/2020 1151   CL 105 05/09/2020 1151   CO2 26 05/09/2020 1151   GLUCOSE 85 05/09/2020 1151   BUN 31 (H) 05/09/2020 1151   BUN 40 (H) 07/12/2019 1139   CREATININE 1.16 (H) 05/09/2020 1151   CREATININE 1.27 (H) 07/07/2018 1144   CALCIUM 9.8 05/09/2020 1151   PROT 6.8 07/31/2020 1519   ALBUMIN 4.2 07/31/2020 1519   AST 18 07/31/2020 1519   AST 18 07/07/2018 1144   ALT 8 07/31/2020 1519   ALT 10 07/07/2018 1144   ALKPHOS 57 07/31/2020 1519   BILITOT 0.7 07/31/2020 1519   BILITOT 0.8 07/07/2018 1144   GFRNONAA 45 (L) 05/09/2020 1151   GFRNONAA 38 (L) 07/07/2018 1144   GFRAA 46 (L) 07/25/2019 1519   GFRAA 44 (L) 07/07/2018 1144   Lab Results  Component Value Date   WBC 2.8 (L) 05/09/2020   HGB 12.8 05/09/2020   HCT 38.4 05/09/2020   MCV 99.5 05/09/2020   PLT 162 05/09/2020    Imaging Studies:   Assessment and Plan:   Emireth Cockerham is a 87 y.o. y/o female here for follow-up of abdominal pain with continued symptoms despite relief of constipation, and 30 pound weight loss in a year  Patient's work-up so far has been reassuring, however, her weight loss is of concern  I did discuss with her if she would like me to speak to her family member/daughter about her symptoms and further work-up and patient declines this at this time and would like to continue to make her own decisions  I will obtain labs at this time to rule out any underlying anemia, evaluate liver enzymes and electrolytes, and also obtain H. pylori serology.  H. pylori breath test was previously done but this is to ensure that this was not  false negative given her ongoing symptoms  Due to her ongoing weight loss  and postprandial pain, mesenteric Doppler indicated at this time as well.  Her creatinine was previously elevated and therefore mesenteric Doppler would be better than doing CTA at this time  Pt reports history of IBS and states she was on lorazepam for it before.  She is asking if she can get this again.  I have advised her to speak to her prescribing provider about lorazepam, but I will not be prescribing that at this time.  Okay to use IBS Donald Prose until further work-up as above is available and patient is agreeable to this.  Samples given.  Patient may need EGD and colonoscopy if above work-up is unrevealing given her unexplained weight loss and ongoing abdominal pain  Dr [name]

## 2020-11-10 NOTE — Telephone Encounter (Signed)
[place] Mesenteric arteries has been scheduled for Aug 9 at [facility] [place], [place] arrive at 10:15 AM. Pt is to have NPO after 12 midnight  [phone] to r/s  I called Ryan and was advised to call back in the morning to speak with Alwyn Ren about the appointment information

## 2020-11-11 ENCOUNTER — Telehealth: Payer: Self-pay | Admitting: Gastroenterology

## 2020-11-11 NOTE — Telephone Encounter (Signed)
[phone] Patient wants a return phone as soon as possible. Experiencing left side pain & Nausea. Clinical staff follow up with patient.

## 2020-11-12 LAB — COMPREHENSIVE METABOLIC PANEL
ALT: 11 IU/L (ref 0–32)
AST: 15 IU/L (ref 0–40)
Albumin/Globulin Ratio: 1.4 (ref 1.2–2.2)
Albumin: 3.9 g/dL (ref 3.6–4.6)
Alkaline Phosphatase: 57 IU/L (ref 44–121)
BUN/Creatinine Ratio: 17 (ref 12–28)
BUN: 20 mg/dL (ref 8–27)
Bilirubin Total: 0.7 mg/dL (ref 0.0–1.2)
CO2: 27 mmol/L (ref 20–29)
Calcium: 9.4 mg/dL (ref 8.7–10.3)
Chloride: 99 mmol/L (ref 96–106)
Creatinine, Ser: 1.18 mg/dL — ABNORMAL HIGH (ref 0.57–1.00)
Globulin, Total: 2.7 g/dL (ref 1.5–4.5)
Glucose: 100 mg/dL — ABNORMAL HIGH (ref 65–99)
Potassium: 4.3 mmol/L (ref 3.5–5.2)
Sodium: 140 mmol/L (ref 134–144)
Total Protein: 6.6 g/dL (ref 6.0–8.5)
eGFR: 44 mL/min/{1.73_m2} — ABNORMAL LOW (ref >59)

## 2020-11-12 LAB — CBC
Hematocrit: 35.5 % (ref 34.0–46.6)
Hemoglobin: 11.6 g/dL (ref 11.1–15.9)
MCH: 33.2 pg — ABNORMAL HIGH (ref 26.6–33.0)
MCHC: 32.7 g/dL (ref 31.5–35.7)
MCV: 102 fL — ABNORMAL HIGH (ref 79–97)
Platelets: 173 10*3/uL (ref 150–450)
RBC: 3.49 x10E6/uL — ABNORMAL LOW (ref 3.77–5.28)
RDW: 12.5 % (ref 11.7–15.4)
WBC: 2.8 10*3/uL — ABNORMAL LOW (ref 3.4–10.8)

## 2020-11-12 LAB — H PYLORI, IGM, IGG, IGA AB
H pylori, IgM Abs: <9 units (ref 0.0–8.9)
H. pylori, IgA Abs: <9 units (ref 0.0–8.9)
H. pylori, IgG AbS: 0.47 Index Value (ref 0.00–0.79)

## 2020-11-13 NOTE — Telephone Encounter (Signed)
Virgel Manifold, MD  You      I discussed the patient with her primary care doctor Dr. Alain Marion, and they state that patient has had psychosomatic issues.  This may be contributing to her symptoms.   I just saw her yesterday.  If she is having acute changes in her symptoms such as worsening pain, nausea and vomiting, her facility may need to take her to the ER depending on the acuity of her symptoms.  She was stable yesterday and we are awaiting her mesenteric doppler.   I would also advise that she make an appointment with Dr. Adele Schilder, who the PCP states is her psych doctor who was prescribing her medications for her psych diagnosis.  The patient wanted to know about resuming her lorazepam that was previously discontinued by one of her providers, and they may need to make an appointment with them to discuss this further.

## 2020-11-17 NOTE — Telephone Encounter (Signed)
Left message on voicemail for [name] to return my call so that I can give info on [place]

## 2020-11-20 ENCOUNTER — Emergency Department
Admission: EM | Admit: 2020-11-20 | Discharge: 2020-11-20 | Disposition: A | Discharge status: Home / Self Care | Payer: Medicare Other | Attending: Emergency Medicine | Admitting: Emergency Medicine

## 2020-11-20 ENCOUNTER — Other Ambulatory Visit: Payer: Self-pay

## 2020-11-20 ENCOUNTER — Encounter: Payer: Self-pay | Admitting: Emergency Medicine

## 2020-11-20 DIAGNOSIS — N183 Chronic kidney disease, stage 3 unspecified: Secondary | ICD-10-CM | POA: Diagnosis not present

## 2020-11-20 DIAGNOSIS — R531 Weakness: Secondary | ICD-10-CM | POA: Insufficient documentation

## 2020-11-20 DIAGNOSIS — Z853 Personal history of malignant neoplasm of breast: Secondary | ICD-10-CM | POA: Diagnosis not present

## 2020-11-20 DIAGNOSIS — Y92129 Unspecified place in nursing home as the place of occurrence of the external cause: Secondary | ICD-10-CM | POA: Insufficient documentation

## 2020-11-20 DIAGNOSIS — Z950 Presence of cardiac pacemaker: Secondary | ICD-10-CM | POA: Diagnosis not present

## 2020-11-20 DIAGNOSIS — Z7901 Long term (current) use of anticoagulants: Secondary | ICD-10-CM | POA: Diagnosis not present

## 2020-11-20 DIAGNOSIS — I509 Heart failure, unspecified: Secondary | ICD-10-CM | POA: Diagnosis not present

## 2020-11-20 DIAGNOSIS — I10 Essential (primary) hypertension: Secondary | ICD-10-CM | POA: Diagnosis not present

## 2020-11-20 DIAGNOSIS — R350 Frequency of micturition: Secondary | ICD-10-CM | POA: Diagnosis not present

## 2020-11-20 DIAGNOSIS — Z7401 Bed confinement status: Secondary | ICD-10-CM | POA: Diagnosis not present

## 2020-11-20 DIAGNOSIS — R55 Syncope and collapse: Secondary | ICD-10-CM | POA: Diagnosis not present

## 2020-11-20 DIAGNOSIS — I2510 Atherosclerotic heart disease of native coronary artery without angina pectoris: Secondary | ICD-10-CM | POA: Diagnosis not present

## 2020-11-20 DIAGNOSIS — I130 Hypertensive heart and chronic kidney disease with heart failure and stage 1 through stage 4 chronic kidney disease, or unspecified chronic kidney disease: Secondary | ICD-10-CM | POA: Insufficient documentation

## 2020-11-20 DIAGNOSIS — Z955 Presence of coronary angioplasty implant and graft: Secondary | ICD-10-CM | POA: Insufficient documentation

## 2020-11-20 DIAGNOSIS — J45909 Unspecified asthma, uncomplicated: Secondary | ICD-10-CM | POA: Diagnosis not present

## 2020-11-20 DIAGNOSIS — R262 Difficulty in walking, not elsewhere classified: Secondary | ICD-10-CM | POA: Insufficient documentation

## 2020-11-20 DIAGNOSIS — E039 Hypothyroidism, unspecified: Secondary | ICD-10-CM | POA: Diagnosis not present

## 2020-11-20 DIAGNOSIS — W19XXXA Unspecified fall, initial encounter: Secondary | ICD-10-CM

## 2020-11-20 DIAGNOSIS — Z79899 Other long term (current) drug therapy: Secondary | ICD-10-CM | POA: Insufficient documentation

## 2020-11-20 DIAGNOSIS — W06XXXA Fall from bed, initial encounter: Secondary | ICD-10-CM | POA: Insufficient documentation

## 2020-11-20 DIAGNOSIS — R1084 Generalized abdominal pain: Secondary | ICD-10-CM | POA: Diagnosis not present

## 2020-11-20 DIAGNOSIS — R5381 Other malaise: Secondary | ICD-10-CM | POA: Diagnosis not present

## 2020-11-20 DIAGNOSIS — R109 Unspecified abdominal pain: Secondary | ICD-10-CM | POA: Diagnosis not present

## 2020-11-20 LAB — CBC WITH DIFFERENTIAL/PLATELET
Abs Immature Granulocytes: 0.15 10*3/uL — ABNORMAL HIGH (ref 0.00–0.07)
Basophils Absolute: 0 10*3/uL (ref 0.0–0.1)
Basophils Relative: 1 %
Eosinophils Absolute: 0.1 10*3/uL (ref 0.0–0.5)
Eosinophils Relative: 3 %
HCT: 32.7 % — ABNORMAL LOW (ref 36.0–46.0)
Hemoglobin: 11.1 g/dL — ABNORMAL LOW (ref 12.0–15.0)
Immature Granulocytes: 8 %
Lymphocytes Relative: 32 %
Lymphs Abs: 0.6 10*3/uL — ABNORMAL LOW (ref 0.7–4.0)
MCH: 35 pg — ABNORMAL HIGH (ref 26.0–34.0)
MCHC: 33.9 g/dL (ref 30.0–36.0)
MCV: 103.2 fL — ABNORMAL HIGH (ref 80.0–100.0)
Monocytes Absolute: 0.2 10*3/uL (ref 0.1–1.0)
Monocytes Relative: 10 %
Neutro Abs: 0.9 10*3/uL — ABNORMAL LOW (ref 1.7–7.7)
Neutrophils Relative %: 46 %
Platelets: 106 10*3/uL — ABNORMAL LOW (ref 150–400)
RBC: 3.17 MIL/uL — ABNORMAL LOW (ref 3.87–5.11)
RDW: 14.1 % (ref 11.5–15.5)
Smear Review: NORMAL
WBC: 1.9 10*3/uL — ABNORMAL LOW (ref 4.0–10.5)
nRBC: 0 % (ref 0.0–0.2)

## 2020-11-20 LAB — URINALYSIS, COMPLETE (UACMP) WITH MICROSCOPIC
Bilirubin Urine: NEGATIVE
Glucose, UA: NEGATIVE mg/dL
Ketones, ur: 5 mg/dL — AB
Leukocytes,Ua: NEGATIVE
Nitrite: NEGATIVE
Protein, ur: NEGATIVE mg/dL
Specific Gravity, Urine: 1.011 (ref 1.005–1.030)
Squamous Epithelial / HPF: NONE SEEN (ref 0–5)
pH: 6 (ref 5.0–8.0)

## 2020-11-20 LAB — COMPREHENSIVE METABOLIC PANEL
ALT: 15 U/L (ref 0–44)
AST: 21 U/L (ref 15–41)
Albumin: 3.4 g/dL — ABNORMAL LOW (ref 3.5–5.0)
Alkaline Phosphatase: 39 U/L (ref 38–126)
Anion gap: 4 — ABNORMAL LOW (ref 5–15)
BUN: 22 mg/dL (ref 8–23)
CO2: 31 mmol/L (ref 22–32)
Calcium: 8.8 mg/dL — ABNORMAL LOW (ref 8.9–10.3)
Chloride: 105 mmol/L (ref 98–111)
Creatinine, Ser: 1.06 mg/dL — ABNORMAL HIGH (ref 0.44–1.00)
GFR, Estimated: 51 mL/min — ABNORMAL LOW (ref >60)
Glucose, Bld: 80 mg/dL (ref 70–99)
Potassium: 3.2 mmol/L — ABNORMAL LOW (ref 3.5–5.1)
Sodium: 140 mmol/L (ref 135–145)
Total Bilirubin: 0.9 mg/dL (ref 0.3–1.2)
Total Protein: 6 g/dL — ABNORMAL LOW (ref 6.5–8.1)

## 2020-11-20 LAB — TROPONIN I (HIGH SENSITIVITY)
Troponin I (High Sensitivity): 9 ng/L (ref <18)
Troponin I (High Sensitivity): 9 ng/L (ref <18)

## 2020-11-20 LAB — LIPASE, BLOOD: Lipase: 20 U/L (ref 11–51)

## 2020-11-20 NOTE — ED Provider Notes (Signed)
[facility] Emergency Department Provider Note ____________________________________________   Event Date/Time   First MD Initiated Contact with Patient 11/20/20 [phone]     (approximate)  I have reviewed the triage vital signs and the nursing notes.   HISTORY  Chief Complaint Loss of Consciousness    HPI [name] is a 87 y.o. female with PMH as noted below including CHF, hypertension, chronic kidney disease, and IBS who presents after 2 falls last night.  The states that she has been getting up to go to the bathroom relatively frequently at night.  She got up but fell when she was getting out of her bed and trying to put her slippers on.  She denies hitting her head and has no pain from the fall.  She states that she then fell a second time but is somewhat unclear on the time course of this.  She states that she was on the floor for few hours this morning.  She reports chronic abdominal pain related to IBS and states that she had a little bit of abdominal pain last night but this has since resolved.  To me the patient denied passing out due to the fall and states that she fell asleep after falling.  Patient states that she feels fine now and denies any pain.  She states she is just cold.  She states that she has been able to take care of herself in her current living situation and feels safe going home.  Past Medical History:  Diagnosis Date   Anxiety    Bradycardia    Breast cancer, left breast ([place]) 1995   Breast cancer, right breast ([place]) 2002   CHF (congestive heart failure) (HCC)    Depression    Dr Cheryln Manly   Discoid lupus    skin   Full dentures    GERD (gastroesophageal reflux disease)    Glaucoma    Gout    Hx of echocardiogram    a.  Echocardiogram (03/2011): Mild LVH, EF 0000000, grade 1 diastolic dysfunction, mild MR, mild to moderate TR, PASP 42;  b. Echo (05/2013):  Mod LVH, EF 65-70%, no WMA, Gr 1 DD, mild MR, mod TR, PASP 39   Hx of  exercise stress test    a. ETT-echocardiogram (06/08/11): Sub-optimal exercise. Test stopped early due to dizziness and hypotension. EF 60%.  Non-diagnostic.;   b. Lexiscan Myoview (06/2013):  Diaphragmatic attenuation, no ischemia, EF 61%.  Low Risk   Hypertension    Hypothyroidism    IBS (irritable bowel syndrome)    Incontinence    OSA (obstructive sleep apnea) 07/20/2016   Mild with AHI 12/hr now on CPAP   Osteoarthritis    Osteopenia    Pacemaker 2009   Brady//Chronotropic incompetence with normal pacemaker function   Thyroid disease    Wears hearing aid    both ears    Patient Active Problem List   Diagnosis Date Noted   Moderate recurrent major depression ([place]) 05/09/2020   Generalized anxiety disorder 05/09/2020   Moderately severe recurrent major depression ([place]) 05/09/2020   Weight loss 10/09/2019   DVT (deep venous thrombosis) ([place]) 03/29/2018   PE (pulmonary thromboembolism) ([place]) 02/03/2018   Macrocytosis 12/21/2017   Wart 12/21/2017   Sleep apnea 07/20/2016   Sigmoid diverticulitis 07/01/2016   Acute diverticulitis    Rectal bleeding 06/30/2016   Impacted cerumen of right ear 03/26/2016   Syncope 03/22/2016   Benign essential HTN 03/22/2016   CAD (coronary artery disease)  03/22/2016   CKD (chronic kidney disease) stage 3, GFR 30-59 ml/min (HCC) 03/22/2016   Depression 03/22/2016   Exertional dyspnea 11/04/2015   Ascending aortic aneurysm ([place]) 11/04/2015   Discoid lupus 10/10/2015   Arthralgia 10/10/2015   Pulmonary hypertension ([place]) 09/30/2015   Mild persistent asthma 09/30/2015   Irritable larynx 09/04/2015   Rotator cuff disorder 03/06/2015   OCD (obsessive compulsive disorder) 04/03/2013   IBS (irritable bowel syndrome) 07/03/2012   Dysphagia 04/13/2012   Denture irritation 12/20/2011   Irritable bladder 12/20/2011   Knee pain, bilateral 12/20/2011   Hearing loss 10/11/2011   Pacemaker-Medtronic 05/18/2011   DOE (dyspnea on exertion) 04/13/2011    Gout, unspecified 04/30/2010   Anal fissure 02/03/2010   Atrial fibrillation ([place]) 08/26/2009   Sinoatrial node dysfunction ([place]) 09/17/2008   HYPOTENSION, ORTHOSTATIC 01/29/2008   Microscopic hematuria 08/21/2007   Pain in joint, pelvic region and thigh 08/01/2007   Vitamin D deficiency 05/23/2007   Anxiety disorder 05/23/2007   Hypothyroidism 03/22/2007   GERD 03/22/2007   Osteoarthritis 03/22/2007   CFS (chronic fatigue syndrome) 11/17/2006   Hypertensive heart disease 11/17/2006   ALLERGIC RHINITIS 11/17/2006   OSTEOPOROSIS 11/17/2006   BREAST CANCER, HX OF 11/17/2006    Past Surgical History:  Procedure Laterality Date   BREAST BIOPSY Left 1995   BREAST LUMPECTOMY Left 1995   axillary node dissectoin   CARDIAC CATHETERIZATION N/A 02/13/2016   Procedure: Right Heart Cath;  Surgeon: Larey Dresser, MD;  Location: [place] [facility];  Service: Cardiovascular;  Laterality: N/A;   CATARACT EXTRACTION W/ INTRAOCULAR LENS  IMPLANT, BILATERAL Bilateral 06/2009 - 12.2915   left - right   DORSAL COMPARTMENT RELEASE  2000   left   HAMMER TOE SURGERY Left 2004   HEMORRHOID BANDING     INSERT / REPLACE / REMOVE PACEMAKER  2009   KNEE ARTHROSCOPY Left 08/29/2012   Procedure: LEFT KNEE ARTHROSCOPY ;  Surgeon: Hessie Dibble, MD;  Location: [place];  Service: Orthopedics;  Laterality: Left;   MASTECTOMY Bilateral 09/2000   nbx   PPM GENERATOR CHANGEOUT N/A 07/16/2019   Procedure: PPM GENERATOR CHANGEOUT;  Surgeon: Deboraha Sprang, MD;  Location: [place] [facility];  Service: Cardiovascular;  Laterality: N/A;   TRIGGER FINGER RELEASE Right 07/31/2013   Procedure: EXCISE MASS RIGHT INDEX A-1 PLLLEY RELEASE A-1 RIGHT INDEX;  Surgeon: Cammie Sickle., MD;  Location: [place];  Service: Orthopedics;  Laterality: Right;   VAGINAL HYSTERECTOMY      Prior to Admission medications   Medication Sig Start Date End Date Taking? Authorizing Provider   ALPRAZolam Duanne Moron) 0.5 MG tablet Take 0.5 mg by mouth 2 (two) times daily as needed for anxiety.    [provider]  apixaban (ELIQUIS) 5 MG TABS tablet Take 1 tablet (5 mg total) by mouth 2 (two) times daily. Annual appt is due must see provider for future refills 10/10/19   Plotnikov, Evie Lacks, MD  busPIRone (BUSPAR) 7.5 MG tablet Take 7.5 mg by mouth 2 (two) times daily. 09/05/20   [provider]  cetirizine (ZYRTEC) 10 MG tablet Take 1 tablet (10 mg total) by mouth daily. 10/09/19   Plotnikov, Evie Lacks, MD  clotrimazole-betamethasone (LOTRISONE) cream Apply 1 application topically in the morning and at bedtime. 06/27/19   [provider]  escitalopram (LEXAPRO) 10 MG tablet TAKE 1 TABLET AT BEDTIME 05/22/20   Plotnikov, Evie Lacks, MD  famotidine (PEPCID) 10 MG tablet Take  1 tablet (10 mg total) by mouth 2 (two) times daily. 05/14/20   Salley Scarlet, MD  FLUoxetine (PROZAC) 20 MG capsule Take 20 mg by mouth daily.    [provider]  levothyroxine (SYNTHROID) 100 MCG tablet Take 1 tablet (100 mcg total) by mouth daily at 6 (six) AM. 05/15/20   Salley Scarlet, MD  magnesium oxide (MAG-OX) 400 MG tablet Take 0.5 tablets (200 mg total) by mouth daily. Needs appt for further refills 05/14/20   Salley Scarlet, MD  pantoprazole (PROTONIX) 40 MG tablet Take 1 tablet (40 mg total) by mouth daily. 07/31/20   Virgel Manifold, MD  polyethylene glycol (MIRALAX MIX-IN PAX) 17 g packet Take 17 g by mouth daily. 10/14/20 01/12/21  Virgel Manifold, MD  REFRESH TEARS 0.5 % SOLN Place 1 drop into both eyes daily as needed (dry eyes). 02/07/17   [provider]  spironolactone (ALDACTONE) 25 MG tablet Take 1 tablet (25 mg total) by mouth daily. 05/14/20   Salley Scarlet, MD  tolterodine (DETROL LA) 2 MG 24 hr capsule Take 1 capsule (2 mg total) by mouth daily. 12/17/19   Plotnikov, Evie Lacks, MD  traZODone (DESYREL) 100 MG tablet Take 1 tablet (100 mg total) by  mouth at bedtime as needed for sleep. 05/14/20   Salley Scarlet, MD    Allergies Lactase, Amlodipine besy-benazepril hcl, Amlodipine besylate, Clonidine hydrochloride, Lactose intolerance (gi), Valsartan, and Verapamil  Family History  Problem Relation Age of Onset   Ovarian cancer Mother    Heart disease Father    Stroke Sister    Stroke Brother    Cancer Brother    Heart disease Brother     Social History Social History   Tobacco Use   Smoking status: Never   Smokeless tobacco: Never  Vaping Use   Vaping Use: Never used  Substance Use Topics   Alcohol use: No    Alcohol/week: 0.0 standard drinks   Drug use: No    Review of Systems  Constitutional: No fever. Eyes: No redness. ENT: No neck pain. Cardiovascular: Denies chest pain. Respiratory: Denies shortness of breath. Gastrointestinal: No vomiting or diarrhea.  Genitourinary: Negative for dysuria.  Positive for urinary frequency. Musculoskeletal: Negative for back pain. Skin: Negative for rash. Neurological: Negative for headaches, focal weakness or numbness.   ____________________________________________   PHYSICAL EXAM:  VITAL SIGNS: ED Triage Vitals  Enc Vitals Group     BP 11/20/20 0327 111/74     Pulse Rate 11/20/20 0327 60     Resp 11/20/20 0327 18     Temp 11/20/20 0327 97.7 F (36.5 C)     Temp Source 11/20/20 0327 Oral     SpO2 11/20/20 0327 100 %     Weight 11/20/20 0332 101 lb (45.8 kg)     Height 11/20/20 0332 '5\' 4"'$  (1.626 m)     Head Circumference --      Peak Flow --      Pain Score 11/20/20 0332 3     Pain Loc --      Pain Edu? --      Excl. in [place]? --     Constitutional: Alert and oriented. Well appearing for age and in no acute distress. Eyes: Conjunctivae are normal.  EOMI.  PERRLA. Head: Atraumatic. Nose: No congestion/rhinnorhea. Mouth/Throat: Mucous membranes are slightly dry. Neck: Normal range of motion.  Cardiovascular: Normal rate, regular rhythm. Grossly normal  heart sounds.  Good peripheral circulation. Respiratory:  Normal respiratory effort.  No retractions. Lungs CTAB. Gastrointestinal: Soft and nontender. No distention.  Genitourinary: No flank tenderness. Musculoskeletal: No lower extremity edema.  Extremities warm and well perfused.  Neurologic:  Normal speech and language.  Motor intact in all extremities.  Normal coordination.  No gross focal neurologic deficits are appreciated.  Skin:  Skin is warm and dry. No rash noted. Psychiatric: Mood and affect are normal. Speech and behavior are normal.  ____________________________________________   LABS (all labs ordered are listed, but only abnormal results are displayed)  Labs Reviewed  CBC WITH DIFFERENTIAL/PLATELET - Abnormal; Notable for the following components:      Result Value   WBC 1.9 (*)    RBC 3.17 (*)    Hemoglobin 11.1 (*)    HCT 32.7 (*)    MCV 103.2 (*)    MCH 35.0 (*)    Platelets 106 (*)    Neutro Abs 0.9 (*)    Lymphs Abs 0.6 (*)    Abs Immature Granulocytes 0.15 (*)    All other components within normal limits  COMPREHENSIVE METABOLIC PANEL - Abnormal; Notable for the following components:   Potassium 3.2 (*)    Creatinine, Ser 1.06 (*)    Calcium 8.8 (*)    Total Protein 6.0 (*)    Albumin 3.4 (*)    GFR, Estimated 51 (*)    Anion gap 4 (*)    All other components within normal limits  URINALYSIS, COMPLETE (UACMP) WITH MICROSCOPIC - Abnormal; Notable for the following components:   Color, Urine YELLOW (*)    APPearance CLEAR (*)    Hgb urine dipstick MODERATE (*)    Ketones, ur 5 (*)    Bacteria, UA RARE (*)    All other components within normal limits  LIPASE, BLOOD  TROPONIN I (HIGH SENSITIVITY)  TROPONIN I (HIGH SENSITIVITY)   ____________________________________________  EKG  ED ECG REPORT I, Arta Silence, the attending physician, personally viewed and interpreted this ECG.  Date: 11/20/2020 EKG Time: 0344 Rate: 60 Rhythm: Sinus  rhythm QRS Axis: normal Intervals: LAFB ST/T Wave abnormalities: Specific T wave abnormalities Narrative Interpretation: Nonspecific abnormalities with no evidence of acute ischemia; T wave inversions less prominent when compared to EKG of 07/25/2019 and overall morphology similar  ____________________________________________  RADIOLOGY    ____________________________________________   PROCEDURES  Procedure(s) performed: No  Procedures  Critical Care performed: No ____________________________________________   INITIAL IMPRESSION / ASSESSMENT AND PLAN / ED COURSE  Pertinent labs & imaging results that were available during my care of the patient were reviewed by me and considered in my medical decision making (see chart for details).   87 year old female with PMH as noted above including CHF, hypertension, CKD, and IBS presents after at least 1 and possibly 2 falls last night which she describes as mechanical and related to chronic weakness and difficulty ambulating (the patient normally ambulates with a walker but was getting up at night to use the bathroom).  She had some abdominal pain last night similar to prior IBS pain, but this has resolved.  She denies any acute complaints currently.  On exam the patient is overall well-appearing.  Her vital signs are normal except for hypertension.  The physical exam is unremarkable.  Neurologic exam is nonfocal.  The abdomen is soft and nontender.  I reviewed the past medical records in [place].  The patient was most recently admitted in January due to mental health concerns.  She is denying any acute mental health issues  at this time.  Overall presentation is consistent with mechanical fall due to chronic difficulty ambulating.  The patient is a somewhat disorganized historian and so I cannot completely pinpoint whether she had syncope, but it seems like she fell and then subsequently fell asleep.  There is no evidence concerning for cardiac  etiology, and no evidence of stroke or other CNS cause.  She has no signs of of any injury requiring imaging.  Her abdominal pain is consistent with her chronic IBS and she currently has no pain or tenderness on exam.  Lab work-up obtained from triage is overall reassuring, with no significant acute abnormalities on CMP, and negative troponins x2.  CBC reveals pancytopenia.  The patient normally has a relatively low WBC count.  It is possible that the sample could have been diluted.  However, there are no concerning abnormalities requiring acute intervention.  Given that the patient reports urinary frequency at night I would like to check a urinalysis to rule out UTI.  Otherwise, the patient is stable for discharge home.  She has already been in the ED for close to 7 hours with stable vital signs, no recurrent abdominal pain or other acute symptoms, and feels well.  She feels comfortable and wants to go home.  Plan will be for discharge once the UA is resulted.  ----------------------------------------- 11:04 AM on 11/20/2020 -----------------------------------------  UA is unremarkable.  The patient continues to be asymptomatic and is stable for discharge home.  I counseled her on the results of the work-up and return precautions, and she expressed understanding.  ____________________________________________   FINAL CLINICAL IMPRESSION(S) / ED DIAGNOSES  Final diagnoses:  Fall, initial encounter      NEW MEDICATIONS STARTED DURING THIS VISIT:  New Prescriptions   No medications on file     Note:  This document was prepared using Dragon voice recognition software and may include unintentional dictation errors.    Arta Silence, MD 11/20/20 1104

## 2020-11-20 NOTE — ED Notes (Signed)
RN to pt bedside due to bed alarm sounding. Pt found attempting to get out of bed. Pt bed linens and brief changed due to episode of urinary incontinence, peri care performed. Pt assisted back into bed, resting comfortably - stretcher locked in low position with side rails up x2, call light in reach. Fall precautions including bed alarm in use.

## 2020-11-20 NOTE — ED Notes (Signed)
Attempted to call [facility] x2 to inform them pt in route to facility, no answer. Report called previously.

## 2020-11-20 NOTE — ED Notes (Signed)
Bedside report received from [place], [place]. Pt resting in bed, appears comfortable. Denies needs at this time. Stretcher locked in low position with side rails up x2, call light in reach. High fall precautions in place including bed alarm.

## 2020-11-20 NOTE — ED Provider Notes (Signed)
Emergency Medicine Provider Triage Evaluation Note  [name] , a 87 y.o. female  was evaluated in triage.  Pt complains of falling and chronic abdominal pain.  Hx of IBS, and patient reports she is no longer on medication for it.  She said her abdomen was hurting earlier but no longer.  She got out of bed and believes that she passed out.  She was unable to get up off the floor and she says it took a while for anyone to find her.  Denies chest pain or shortness of breath.  Denies nausea and vomiting.  Review of Systems  Positive: Probable syncopal episode, chronic abdominal pain Negative: Chest pain, shortness of breath, nausea, vomiting, numbness or weakness in extremity.  Physical Exam  BP 111/74 (BP Location: Right Arm)   Pulse 60   Temp 97.7 F (36.5 C) (Oral)   Resp 18   Ht 1.626 m ('5\' 4"'$ )   Wt 45.8 kg   SpO2 100%   BMI 17.34 kg/m  Gen:   Awake, no distress   Resp:  Normal effort , no wheezes nor coarse breath sounds MSK:   Moves extremities slowly but without difficulty .  No tenderness to palpation of the cervical spine and no pain or tenderness with flexion/extension or rotation side to side of her head and neck.  Medical Decision Making  Medically screening exam initiated at 6:25 AM.  Appropriate orders placed.  [name] was informed that the remainder of the evaluation will be completed by another provider, this initial triage assessment does not replace that evaluation, and the importance of remaining in the ED until their evaluation is complete.  No indication for emergent imaging right now.  Labs ordered.  Vitals reassuring, patient stable to wait for full provider evaluation.   Hinda Kehr, MD 11/20/20 [phone]

## 2020-11-20 NOTE — Discharge Instructions (Addendum)
Return to the ER for new or worsening abdominal pain, weakness, recurrent falls, or any other new or worsening symptoms that concern you.  Follow-up with your regular doctor.  Continue to take all of your normal medications as prescribed.

## 2020-11-20 NOTE — ED Notes (Signed)
ED Provider at bedside. 

## 2020-11-20 NOTE — ED Notes (Signed)
RN to bedside due to bed alarm sounding, pt found with brief on the floor, states "I'm trying to clean myself up". Episode of urinary incontinence x1, peri care performed and brief changed. Pt denies further needs, stretcher locked in low position with side rails up x2, call light in reach. High fall precautions including bed alarm in use.

## 2020-11-20 NOTE — ED Triage Notes (Signed)
Pt to triage via w/c with no distress noted; st hx IBS and has been at [facility] since October; st that she had syncopal episode tonight; denies any injury but c/o chronic lower abd pain from her IBS ; also reports she couldn't get to her call bell and fell OOB trying to go to [facility]

## 2020-11-20 NOTE — ED Notes (Signed)
[facility]  CALLED  FOR  TRANSPORT   TO  [place]  [facility] 

## 2020-11-20 NOTE — ED Notes (Signed)
RN to bedside due to bed alarm sounding, pt attempting to get out of bed, states "I need to get out of here". Reoriented patient to place and situation, pt given warm blanket and drink. Stretcher locked in low position with side rails up x2, call light in reach, high fall precautions including bed alarm in place.

## 2020-11-23 ENCOUNTER — Other Ambulatory Visit: Payer: Self-pay

## 2020-11-23 ENCOUNTER — Emergency Department: Payer: Medicare Other

## 2020-11-23 ENCOUNTER — Emergency Department
Admission: EM | Admit: 2020-11-23 | Discharge: 2020-11-23 | Disposition: A | Discharge status: Home / Self Care | Payer: Medicare Other | Source: Home / Self Care | Attending: Emergency Medicine | Admitting: Emergency Medicine

## 2020-11-23 ENCOUNTER — Encounter: Payer: Self-pay | Admitting: Emergency Medicine

## 2020-11-23 DIAGNOSIS — R5381 Other malaise: Secondary | ICD-10-CM | POA: Diagnosis not present

## 2020-11-23 DIAGNOSIS — Y9389 Activity, other specified: Secondary | ICD-10-CM | POA: Insufficient documentation

## 2020-11-23 DIAGNOSIS — J4530 Mild persistent asthma, uncomplicated: Secondary | ICD-10-CM | POA: Insufficient documentation

## 2020-11-23 DIAGNOSIS — I959 Hypotension, unspecified: Secondary | ICD-10-CM | POA: Diagnosis not present

## 2020-11-23 DIAGNOSIS — M25512 Pain in left shoulder: Secondary | ICD-10-CM | POA: Insufficient documentation

## 2020-11-23 DIAGNOSIS — Z743 Need for continuous supervision: Secondary | ICD-10-CM | POA: Diagnosis not present

## 2020-11-23 DIAGNOSIS — E161 Other hypoglycemia: Secondary | ICD-10-CM | POA: Diagnosis not present

## 2020-11-23 DIAGNOSIS — R001 Bradycardia, unspecified: Secondary | ICD-10-CM | POA: Diagnosis not present

## 2020-11-23 DIAGNOSIS — R531 Weakness: Secondary | ICD-10-CM | POA: Diagnosis not present

## 2020-11-23 DIAGNOSIS — G319 Degenerative disease of nervous system, unspecified: Secondary | ICD-10-CM | POA: Diagnosis not present

## 2020-11-23 DIAGNOSIS — I2510 Atherosclerotic heart disease of native coronary artery without angina pectoris: Secondary | ICD-10-CM | POA: Insufficient documentation

## 2020-11-23 DIAGNOSIS — R627 Adult failure to thrive: Secondary | ICD-10-CM | POA: Diagnosis not present

## 2020-11-23 DIAGNOSIS — N183 Chronic kidney disease, stage 3 unspecified: Secondary | ICD-10-CM | POA: Insufficient documentation

## 2020-11-23 DIAGNOSIS — R55 Syncope and collapse: Secondary | ICD-10-CM | POA: Insufficient documentation

## 2020-11-23 DIAGNOSIS — Z950 Presence of cardiac pacemaker: Secondary | ICD-10-CM | POA: Insufficient documentation

## 2020-11-23 DIAGNOSIS — Z7901 Long term (current) use of anticoagulants: Secondary | ICD-10-CM | POA: Insufficient documentation

## 2020-11-23 DIAGNOSIS — M25522 Pain in left elbow: Secondary | ICD-10-CM | POA: Insufficient documentation

## 2020-11-23 DIAGNOSIS — Z66 Do not resuscitate: Secondary | ICD-10-CM | POA: Diagnosis not present

## 2020-11-23 DIAGNOSIS — N3000 Acute cystitis without hematuria: Secondary | ICD-10-CM | POA: Diagnosis not present

## 2020-11-23 DIAGNOSIS — E039 Hypothyroidism, unspecified: Secondary | ICD-10-CM | POA: Insufficient documentation

## 2020-11-23 DIAGNOSIS — E162 Hypoglycemia, unspecified: Secondary | ICD-10-CM | POA: Diagnosis not present

## 2020-11-23 DIAGNOSIS — W19XXXA Unspecified fall, initial encounter: Secondary | ICD-10-CM

## 2020-11-23 DIAGNOSIS — W06XXXA Fall from bed, initial encounter: Secondary | ICD-10-CM | POA: Insufficient documentation

## 2020-11-23 DIAGNOSIS — M79622 Pain in left upper arm: Secondary | ICD-10-CM | POA: Insufficient documentation

## 2020-11-23 DIAGNOSIS — N390 Urinary tract infection, site not specified: Secondary | ICD-10-CM | POA: Diagnosis not present

## 2020-11-23 DIAGNOSIS — M19012 Primary osteoarthritis, left shoulder: Secondary | ICD-10-CM | POA: Diagnosis not present

## 2020-11-23 DIAGNOSIS — I509 Heart failure, unspecified: Secondary | ICD-10-CM | POA: Insufficient documentation

## 2020-11-23 DIAGNOSIS — Z20822 Contact with and (suspected) exposure to covid-19: Secondary | ICD-10-CM | POA: Diagnosis not present

## 2020-11-23 DIAGNOSIS — M79602 Pain in left arm: Secondary | ICD-10-CM

## 2020-11-23 DIAGNOSIS — I10 Essential (primary) hypertension: Secondary | ICD-10-CM | POA: Diagnosis not present

## 2020-11-23 DIAGNOSIS — Y92122 Bedroom in nursing home as the place of occurrence of the external cause: Secondary | ICD-10-CM | POA: Insufficient documentation

## 2020-11-23 DIAGNOSIS — M47812 Spondylosis without myelopathy or radiculopathy, cervical region: Secondary | ICD-10-CM | POA: Diagnosis not present

## 2020-11-23 DIAGNOSIS — I130 Hypertensive heart and chronic kidney disease with heart failure and stage 1 through stage 4 chronic kidney disease, or unspecified chronic kidney disease: Secondary | ICD-10-CM | POA: Insufficient documentation

## 2020-11-23 DIAGNOSIS — Z853 Personal history of malignant neoplasm of breast: Secondary | ICD-10-CM | POA: Insufficient documentation

## 2020-11-23 DIAGNOSIS — Z79899 Other long term (current) drug therapy: Secondary | ICD-10-CM | POA: Insufficient documentation

## 2020-11-23 DIAGNOSIS — L89152 Pressure ulcer of sacral region, stage 2: Secondary | ICD-10-CM | POA: Diagnosis not present

## 2020-11-23 DIAGNOSIS — Z043 Encounter for examination and observation following other accident: Secondary | ICD-10-CM | POA: Diagnosis not present

## 2020-11-23 DIAGNOSIS — E43 Unspecified severe protein-calorie malnutrition: Secondary | ICD-10-CM | POA: Diagnosis not present

## 2020-11-23 DIAGNOSIS — J90 Pleural effusion, not elsewhere classified: Secondary | ICD-10-CM | POA: Diagnosis not present

## 2020-11-23 DIAGNOSIS — R4182 Altered mental status, unspecified: Secondary | ICD-10-CM | POA: Diagnosis not present

## 2020-11-23 LAB — BASIC METABOLIC PANEL
Anion gap: 16 — ABNORMAL HIGH (ref 5–15)
BUN: 31 mg/dL — ABNORMAL HIGH (ref 8–23)
CO2: 23 mmol/L (ref 22–32)
Calcium: 8.6 mg/dL — ABNORMAL LOW (ref 8.9–10.3)
Chloride: 103 mmol/L (ref 98–111)
Creatinine, Ser: 1.17 mg/dL — ABNORMAL HIGH (ref 0.44–1.00)
GFR, Estimated: 45 mL/min — ABNORMAL LOW (ref >60)
Glucose, Bld: 50 mg/dL — ABNORMAL LOW (ref 70–99)
Potassium: 4.6 mmol/L (ref 3.5–5.1)
Sodium: 142 mmol/L (ref 135–145)

## 2020-11-23 LAB — CBC WITH DIFFERENTIAL/PLATELET
Abs Immature Granulocytes: 0.37 10*3/uL — ABNORMAL HIGH (ref 0.00–0.07)
Basophils Absolute: 0 10*3/uL (ref 0.0–0.1)
Basophils Relative: 0 %
Eosinophils Absolute: 0 10*3/uL (ref 0.0–0.5)
Eosinophils Relative: 0 %
HCT: 35.1 % — ABNORMAL LOW (ref 36.0–46.0)
Hemoglobin: 11.6 g/dL — ABNORMAL LOW (ref 12.0–15.0)
Immature Granulocytes: 7 %
Lymphocytes Relative: 11 %
Lymphs Abs: 0.6 10*3/uL — ABNORMAL LOW (ref 0.7–4.0)
MCH: 34.8 pg — ABNORMAL HIGH (ref 26.0–34.0)
MCHC: 33 g/dL (ref 30.0–36.0)
MCV: 105.4 fL — ABNORMAL HIGH (ref 80.0–100.0)
Monocytes Absolute: 0.3 10*3/uL (ref 0.1–1.0)
Monocytes Relative: 6 %
Neutro Abs: 4 10*3/uL (ref 1.7–7.7)
Neutrophils Relative %: 76 %
Platelets: 93 10*3/uL — ABNORMAL LOW (ref 150–400)
RBC: 3.33 MIL/uL — ABNORMAL LOW (ref 3.87–5.11)
RDW: 14.4 % (ref 11.5–15.5)
Smear Review: NORMAL
WBC: 5.3 10*3/uL (ref 4.0–10.5)
nRBC: 0 % (ref 0.0–0.2)

## 2020-11-23 NOTE — ED Notes (Signed)
EMS called for transportation at this time.

## 2020-11-23 NOTE — ED Notes (Signed)
Pt unable to sign discharge signature due to failed hardware.

## 2020-11-23 NOTE — ED Notes (Signed)
Patient transported to CT 

## 2020-11-23 NOTE — ED Notes (Signed)
Pt placed on a small c-collar.

## 2020-11-23 NOTE — ED Provider Notes (Signed)
[facility] Emergency Department Provider Note   ____________________________________________   Event Date/Time   First MD Initiated Contact with Patient 11/23/20 [phone]     (approximate)  I have reviewed the triage vital signs and the nursing notes.   HISTORY  Chief Complaint Fall    HPI [name] is a 87 y.o. female with past medical history of hypertension, CAD, CKD, atrial fibrillation on Eliquis, and DVT/PE who presents to the ED complaining of fall.  Patient reports that her "bed is too large" and that she was reaching for the remote to adjust it when she fell out of her bed.  EMS reports that staff at [facility] found her in her usual state of health at 530 this morning, subsequently found her around 630 on the floor beside her bed.  Patient is unsure whether she hit her head, does state that she hit her left arm and now complains of pain to her left upper arm.  She denies any pain in her head, neck, chest, abdomen, or legs.        Past Medical History:  Diagnosis Date   Anxiety    Bradycardia    Breast cancer, left breast ([place]) 1995   Breast cancer, right breast ([place]) 2002   CHF (congestive heart failure) (HCC)    Depression    Dr Cheryln Manly   Discoid lupus    skin   Full dentures    GERD (gastroesophageal reflux disease)    Glaucoma    Gout    Hx of echocardiogram    a.  Echocardiogram (03/2011): Mild LVH, EF 0000000, grade 1 diastolic dysfunction, mild MR, mild to moderate TR, PASP 42;  b. Echo (05/2013):  Mod LVH, EF 65-70%, no WMA, Gr 1 DD, mild MR, mod TR, PASP 39   Hx of exercise stress test    a. ETT-echocardiogram (06/08/11): Sub-optimal exercise. Test stopped early due to dizziness and hypotension. EF 60%.  Non-diagnostic.;   b. Lexiscan Myoview (06/2013):  Diaphragmatic attenuation, no ischemia, EF 61%.  Low Risk   Hypertension    Hypothyroidism    IBS (irritable bowel syndrome)    Incontinence    OSA (obstructive sleep apnea)  07/20/2016   Mild with AHI 12/hr now on CPAP   Osteoarthritis    Osteopenia    Pacemaker 2009   Brady//Chronotropic incompetence with normal pacemaker function   Thyroid disease    Wears hearing aid    both ears    Patient Active Problem List   Diagnosis Date Noted   Moderate recurrent major depression ([place]) 05/09/2020   Generalized anxiety disorder 05/09/2020   Moderately severe recurrent major depression ([place]) 05/09/2020   Weight loss 10/09/2019   DVT (deep venous thrombosis) ([place]) 03/29/2018   PE (pulmonary thromboembolism) ([place]) 02/03/2018   Macrocytosis 12/21/2017   Wart 12/21/2017   Sleep apnea 07/20/2016   Sigmoid diverticulitis 07/01/2016   Acute diverticulitis    Rectal bleeding 06/30/2016   Impacted cerumen of right ear 03/26/2016   Syncope 03/22/2016   Benign essential HTN 03/22/2016   CAD (coronary artery disease) 03/22/2016   CKD (chronic kidney disease) stage 3, GFR 30-59 ml/min ([place]) 03/22/2016   Depression 03/22/2016   Exertional dyspnea 11/04/2015   Ascending aortic aneurysm ([place]) 11/04/2015   Discoid lupus 10/10/2015   Arthralgia 10/10/2015   Pulmonary hypertension ([place]) 09/30/2015   Mild persistent asthma 09/30/2015   Irritable larynx 09/04/2015   Rotator cuff disorder 03/06/2015   OCD (obsessive compulsive disorder) 04/03/2013  IBS (irritable bowel syndrome) 07/03/2012   Dysphagia 04/13/2012   Denture irritation 12/20/2011   Irritable bladder 12/20/2011   Knee pain, bilateral 12/20/2011   Hearing loss 10/11/2011   Pacemaker-Medtronic 05/18/2011   DOE (dyspnea on exertion) 04/13/2011   Gout, unspecified 04/30/2010   Anal fissure 02/03/2010   Atrial fibrillation ([place]) 08/26/2009   Sinoatrial node dysfunction ([place]) 09/17/2008   HYPOTENSION, ORTHOSTATIC 01/29/2008   Microscopic hematuria 08/21/2007   Pain in joint, pelvic region and thigh 08/01/2007   Vitamin D deficiency 05/23/2007   Anxiety disorder 05/23/2007   Hypothyroidism 03/22/2007    GERD 03/22/2007   Osteoarthritis 03/22/2007   CFS (chronic fatigue syndrome) 11/17/2006   Hypertensive heart disease 11/17/2006   ALLERGIC RHINITIS 11/17/2006   OSTEOPOROSIS 11/17/2006   BREAST CANCER, HX OF 11/17/2006    Past Surgical History:  Procedure Laterality Date   BREAST BIOPSY Left 1995   BREAST LUMPECTOMY Left 1995   axillary node dissectoin   CARDIAC CATHETERIZATION N/A 02/13/2016   Procedure: Right Heart Cath;  Surgeon: Larey Dresser, MD;  Location: [facility];  Service: Cardiovascular;  Laterality: N/A;   CATARACT EXTRACTION W/ INTRAOCULAR LENS  IMPLANT, BILATERAL Bilateral 06/2009 - 12.2915   left - right   DORSAL COMPARTMENT RELEASE  2000   left   HAMMER TOE SURGERY Left 2004   HEMORRHOID BANDING     INSERT / REPLACE / REMOVE PACEMAKER  2009   KNEE ARTHROSCOPY Left 08/29/2012   Procedure: LEFT KNEE ARTHROSCOPY ;  Surgeon: Hessie Dibble, MD;  Location: [facility];  Service: Orthopedics;  Laterality: Left;   MASTECTOMY Bilateral 09/2000   nbx   PPM GENERATOR CHANGEOUT N/A 07/16/2019   Procedure: PPM GENERATOR CHANGEOUT;  Surgeon: Deboraha Sprang, MD;  Location: [facility];  Service: Cardiovascular;  Laterality: N/A;   TRIGGER FINGER RELEASE Right 07/31/2013   Procedure: EXCISE MASS RIGHT INDEX A-1 PLLLEY RELEASE A-1 RIGHT INDEX;  Surgeon: Cammie Sickle., MD;  Location: [facility];  Service: Orthopedics;  Laterality: Right;   VAGINAL HYSTERECTOMY      Prior to Admission medications   Medication Sig Start Date End Date Taking? Authorizing Provider  divalproex (DEPAKOTE SPRINKLE) 125 MG capsule Take 125 mg by mouth 2 (two) times daily.   Yes [provider]  ELIQUIS 2.5 MG TABS tablet Take 2.5 mg by mouth 2 (two) times daily. 10/03/20  Yes [provider]  FLUoxetine (PROZAC) 10 MG tablet Take 10 mg by mouth daily. 10/17/20  Yes [provider]  levothyroxine (SYNTHROID) 50 MCG tablet Take 50  mcg by mouth daily. 09/24/20  Yes [provider]  melatonin 5 MG TABS Take 5 mg by mouth at bedtime.   Yes [provider]  pantoprazole (PROTONIX) 40 MG tablet Take 1 tablet (40 mg total) by mouth daily. 07/31/20  Yes Vonda Antigua B, MD  polyethylene glycol (MIRALAX MIX-IN PAX) 17 g packet Take 17 g by mouth daily. 10/14/20 01/12/21 Yes Virgel Manifold, MD  ALPRAZolam Duanne Moron) 0.5 MG tablet Take 0.5 mg by mouth 2 (two) times daily as needed for anxiety.    [provider]  hydrOXYzine (VISTARIL) 25 MG capsule Take 25 mg by mouth 3 (three) times daily as needed for anxiety or agitation. 09/25/20   [provider]  traZODone (DESYREL) 50 MG tablet Take 25 mg by mouth at bedtime as needed. 09/23/20   [provider]    Allergies Lactase, Amlodipine  besy-benazepril hcl, Amlodipine besylate, Clonidine hydrochloride, Lactose intolerance (gi), Valsartan, and Verapamil  Family History  Problem Relation Age of Onset   Ovarian cancer Mother    Heart disease Father    Stroke Sister    Stroke Brother    Cancer Brother    Heart disease Brother     Social History Social History   Tobacco Use   Smoking status: Never   Smokeless tobacco: Never  Vaping Use   Vaping Use: Never used  Substance Use Topics   Alcohol use: No    Alcohol/week: 0.0 standard drinks   Drug use: No    Review of Systems  Constitutional: No fever/chills Eyes: No visual changes. ENT: No sore throat. Cardiovascular: Denies chest pain. Respiratory: Denies shortness of breath. Gastrointestinal: No abdominal pain.  No nausea, no vomiting.  No diarrhea.  No constipation. Genitourinary: Negative for dysuria. Musculoskeletal: Negative for back pain.  Positive for left arm pain. Skin: Negative for rash. Neurological: Negative for headaches, focal weakness or numbness.  ____________________________________________   PHYSICAL EXAM:  VITAL SIGNS: ED Triage Vitals  Enc  Vitals Group     BP      Pulse      Resp      Temp      Temp src      SpO2      Weight      Height      Head Circumference      Peak Flow      Pain Score      Pain Loc      Pain Edu?      Excl. in [place]?     Constitutional: Alert and oriented. Eyes: Conjunctivae are normal. Head: Atraumatic. Nose: No congestion/rhinnorhea. Mouth/Throat: Mucous membranes are moist. Neck: Normal ROM, no midline cervical spine tenderness to palpation. Cardiovascular: Normal rate, regular rhythm. Grossly normal heart sounds.  2+ radial pulses bilaterally. Respiratory: Normal respiratory effort.  No retractions. Lungs CTAB. Gastrointestinal: Soft and nontender. No distention. Genitourinary: deferred Musculoskeletal: No lower extremity tenderness nor edema.  Diffuse tenderness to left elbow and left shoulder with no obvious deformity, no tenderness noted to left wrist.  No bony tenderness noted to right upper extremity. Neurologic:  Normal speech and language. No gross focal neurologic deficits are appreciated. Skin:  Skin is warm, dry and intact. No rash noted. Psychiatric: Mood and affect are normal. Speech and behavior are normal.  ____________________________________________   LABS (all labs ordered are listed, but only abnormal results are displayed)  Labs Reviewed  CBC WITH DIFFERENTIAL/PLATELET - Abnormal; Notable for the following components:      Result Value   RBC 3.33 (*)    Hemoglobin 11.6 (*)    HCT 35.1 (*)    MCV 105.4 (*)    MCH 34.8 (*)    Platelets 93 (*)    Lymphs Abs 0.6 (*)    Abs Immature Granulocytes 0.37 (*)    All other components within normal limits  BASIC METABOLIC PANEL - Abnormal; Notable for the following components:   Glucose, Bld 50 (*)    BUN 31 (*)    Creatinine, Ser 1.17 (*)    Calcium 8.6 (*)    GFR, Estimated 45 (*)    Anion gap 16 (*)    All other components within normal limits  URINALYSIS, COMPLETE (UACMP) WITH MICROSCOPIC    ____________________________________________  EKG  ED ECG REPORT I, Blake Divine, the attending physician, personally viewed and interpreted this ECG.  Date: 11/23/2020  EKG Time: 7:20  Rate: 95  Rhythm: atrial fibrillation with bigeminy  Axis: LAD  Intervals:left anterior fascicular block  ST&T Change: None   PROCEDURES  Procedure(s) performed (including Critical Care):  Procedures   ____________________________________________   INITIAL IMPRESSION / ASSESSMENT AND PLAN / ED COURSE      87 year old female with past medical history of hypertension, CAD, CKD, atrial fibrillation on Eliquis, and DVT/PE who presents to the ED following fall out of bed where she states she was reaching for a remote.  Patient is unsure whether she hit her head, given she is anticoagulated we will further assess with CT head and cervical spine.  She has no focal neurologic deficits on exam, does complain of left arm pain with diffuse tenderness to elbow and shoulder which we will further assess with x-ray.  No evidence of traumatic injury to her trunk or other extremities.  EKG shows atrial fibrillation with bigeminy, no ischemic changes noted.  We will screen labs and UA, reassess following lab and imaging results.  CT head and cervical spine are negative for acute process, x-rays of left upper extremity reviewed by me with no fracture or dislocation.  Labs are unremarkable, chronic kidney disease comparable to previous.  Patient unable to provide urine sample but denies any urinary symptoms and is not altered from her baseline mental status, low suspicion for UTI.  She is appropriate for discharge back to nursing facility, was counseled to return to the ED for new worsening symptoms.  Patient agrees with plan.      ____________________________________________   FINAL CLINICAL IMPRESSION(S) / ED DIAGNOSES  Final diagnoses:  Fall, initial encounter  Left arm pain     ED Discharge  Orders     None        Note:  This document was prepared using Dragon voice recognition software and may include unintentional dictation errors.    Blake Divine, MD 11/23/20 1048

## 2020-11-23 NOTE — ED Triage Notes (Addendum)
Pt via EMS from [facility]. Pt states she was reaching for the remote to her bed and rolled out of bed. Staff said they saw her in the bed at 5:30 and when they came back at 6:45 she was in the floor. Pt does not recall hitting her head. Pt c/o L arm pain but able to move it. Pt is A&Ox4 and NAD.

## 2020-11-23 NOTE — ED Notes (Signed)
Patient transported to X-ray 

## 2020-11-23 NOTE — ED Notes (Signed)
Pt requesting food at this time. Pt requesting scrambled eggs. Per EDP pt is ok to eat.

## 2020-11-24 ENCOUNTER — Emergency Department
Admission: EM | Admit: 2020-11-24 | Discharge: 2020-11-24 | Disposition: A | Discharge status: Home / Self Care | Payer: Medicare Other | Source: Home / Self Care | Attending: Emergency Medicine | Admitting: Emergency Medicine

## 2020-11-24 ENCOUNTER — Other Ambulatory Visit: Payer: Self-pay

## 2020-11-24 ENCOUNTER — Emergency Department: Payer: Medicare Other

## 2020-11-24 DIAGNOSIS — I509 Heart failure, unspecified: Secondary | ICD-10-CM | POA: Insufficient documentation

## 2020-11-24 DIAGNOSIS — J90 Pleural effusion, not elsewhere classified: Secondary | ICD-10-CM | POA: Diagnosis not present

## 2020-11-24 DIAGNOSIS — E039 Hypothyroidism, unspecified: Secondary | ICD-10-CM | POA: Insufficient documentation

## 2020-11-24 DIAGNOSIS — Z950 Presence of cardiac pacemaker: Secondary | ICD-10-CM | POA: Insufficient documentation

## 2020-11-24 DIAGNOSIS — R404 Transient alteration of awareness: Secondary | ICD-10-CM | POA: Diagnosis not present

## 2020-11-24 DIAGNOSIS — Z7901 Long term (current) use of anticoagulants: Secondary | ICD-10-CM | POA: Insufficient documentation

## 2020-11-24 DIAGNOSIS — Z853 Personal history of malignant neoplasm of breast: Secondary | ICD-10-CM | POA: Insufficient documentation

## 2020-11-24 DIAGNOSIS — I130 Hypertensive heart and chronic kidney disease with heart failure and stage 1 through stage 4 chronic kidney disease, or unspecified chronic kidney disease: Secondary | ICD-10-CM | POA: Insufficient documentation

## 2020-11-24 DIAGNOSIS — R400 Somnolence: Secondary | ICD-10-CM | POA: Diagnosis not present

## 2020-11-24 DIAGNOSIS — N390 Urinary tract infection, site not specified: Secondary | ICD-10-CM | POA: Diagnosis not present

## 2020-11-24 DIAGNOSIS — I2510 Atherosclerotic heart disease of native coronary artery without angina pectoris: Secondary | ICD-10-CM | POA: Insufficient documentation

## 2020-11-24 DIAGNOSIS — N183 Chronic kidney disease, stage 3 unspecified: Secondary | ICD-10-CM | POA: Insufficient documentation

## 2020-11-24 DIAGNOSIS — N3000 Acute cystitis without hematuria: Secondary | ICD-10-CM | POA: Diagnosis not present

## 2020-11-24 DIAGNOSIS — I4891 Unspecified atrial fibrillation: Secondary | ICD-10-CM | POA: Insufficient documentation

## 2020-11-24 DIAGNOSIS — I10 Essential (primary) hypertension: Secondary | ICD-10-CM | POA: Diagnosis not present

## 2020-11-24 DIAGNOSIS — R531 Weakness: Secondary | ICD-10-CM | POA: Insufficient documentation

## 2020-11-24 DIAGNOSIS — Z79899 Other long term (current) drug therapy: Secondary | ICD-10-CM | POA: Insufficient documentation

## 2020-11-24 DIAGNOSIS — Z743 Need for continuous supervision: Secondary | ICD-10-CM | POA: Diagnosis not present

## 2020-11-24 LAB — CBC WITH DIFFERENTIAL/PLATELET
Abs Immature Granulocytes: 0.07 10*3/uL (ref 0.00–0.07)
Basophils Absolute: 0 10*3/uL (ref 0.0–0.1)
Basophils Relative: 0 %
Eosinophils Absolute: 0 10*3/uL (ref 0.0–0.5)
Eosinophils Relative: 1 %
HCT: 32.6 % — ABNORMAL LOW (ref 36.0–46.0)
Hemoglobin: 10.9 g/dL — ABNORMAL LOW (ref 12.0–15.0)
Immature Granulocytes: 2 %
Lymphocytes Relative: 15 %
Lymphs Abs: 0.6 10*3/uL — ABNORMAL LOW (ref 0.7–4.0)
MCH: 34.5 pg — ABNORMAL HIGH (ref 26.0–34.0)
MCHC: 33.4 g/dL (ref 30.0–36.0)
MCV: 103.2 fL — ABNORMAL HIGH (ref 80.0–100.0)
Monocytes Absolute: 0.3 10*3/uL (ref 0.1–1.0)
Monocytes Relative: 8 %
Neutro Abs: 2.8 10*3/uL (ref 1.7–7.7)
Neutrophils Relative %: 74 %
Platelets: 98 10*3/uL — ABNORMAL LOW (ref 150–400)
RBC: 3.16 MIL/uL — ABNORMAL LOW (ref 3.87–5.11)
RDW: 14.2 % (ref 11.5–15.5)
WBC: 3.9 10*3/uL — ABNORMAL LOW (ref 4.0–10.5)
nRBC: 0 % (ref 0.0–0.2)

## 2020-11-24 LAB — COMPREHENSIVE METABOLIC PANEL
ALT: 13 U/L (ref 0–44)
AST: 23 U/L (ref 15–41)
Albumin: 3.2 g/dL — ABNORMAL LOW (ref 3.5–5.0)
Alkaline Phosphatase: 40 U/L (ref 38–126)
Anion gap: 12 (ref 5–15)
BUN: 34 mg/dL — ABNORMAL HIGH (ref 8–23)
CO2: 26 mmol/L (ref 22–32)
Calcium: 8.5 mg/dL — ABNORMAL LOW (ref 8.9–10.3)
Chloride: 104 mmol/L (ref 98–111)
Creatinine, Ser: 1.03 mg/dL — ABNORMAL HIGH (ref 0.44–1.00)
GFR, Estimated: 52 mL/min — ABNORMAL LOW (ref >60)
Glucose, Bld: 62 mg/dL — ABNORMAL LOW (ref 70–99)
Potassium: 3.3 mmol/L — ABNORMAL LOW (ref 3.5–5.1)
Sodium: 142 mmol/L (ref 135–145)
Total Bilirubin: 1.1 mg/dL (ref 0.3–1.2)
Total Protein: 6.2 g/dL — ABNORMAL LOW (ref 6.5–8.1)

## 2020-11-24 LAB — URINALYSIS, COMPLETE (UACMP) WITH MICROSCOPIC
Bilirubin Urine: NEGATIVE
Glucose, UA: NEGATIVE mg/dL
Ketones, ur: 20 mg/dL — AB
Nitrite: NEGATIVE
Protein, ur: 30 mg/dL — AB
Specific Gravity, Urine: 1.019 (ref 1.005–1.030)
WBC, UA: >50 WBC/hpf — ABNORMAL HIGH (ref 0–5)
pH: 5 (ref 5.0–8.0)

## 2020-11-24 LAB — TROPONIN I (HIGH SENSITIVITY): Troponin I (High Sensitivity): 15 ng/L (ref <18)

## 2020-11-24 MED ORDER — CEPHALEXIN 500 MG PO CAPS: 500.0000 mg | ORAL_CAPSULE | Freq: Two times a day (BID) | ORAL | 0 refills | Status: DC

## 2020-11-24 MED ORDER — SODIUM CHLORIDE 0.9 % IV SOLN
1.0000 g | Freq: Once | INTRAVENOUS | Status: AC
  Administered 2020-11-24: 1 g via INTRAVENOUS
  Filled 2020-11-24: qty 10

## 2020-11-24 NOTE — ED Notes (Signed)
E-signature pad unavailable - Pt verbalized understanding of D/C information - no additional concerns at this time.  

## 2020-11-24 NOTE — ED Triage Notes (Signed)
Pt from [facility], per [employer] pt has report of weakness x 1 week with 2-3 falls this week. MD saw pt at facility and decided to send her out for further testing d/t increased lethargy.

## 2020-11-24 NOTE — ED Provider Notes (Signed)
[facility] Emergency Department Provider Note ____________________________________________   Event Date/Time   First MD Initiated Contact with Patient 11/24/20 1014     (approximate)  I have reviewed the triage vital signs and the nursing notes.   HISTORY  Chief Complaint Weakness (Weakness x1 week)  HPI [name] is a 87 y.o. female with history of HTN, CAD, CKD, A-fib, DVT, PE and other history as listed below presents to the emergency department for treatment and evaluation of progressive weakness with 2-3 falls this week. MD evaluated this morning and felt she should be evaluated in the ER.         Past Medical History:  Diagnosis Date   Anxiety    Bradycardia    Breast cancer, left breast ([place]) 1995   Breast cancer, right breast ([place]) 2002   CHF (congestive heart failure) (HCC)    Depression    Dr Cheryln Manly   Discoid lupus    skin   Full dentures    GERD (gastroesophageal reflux disease)    Glaucoma    Gout    Hx of echocardiogram    a.  Echocardiogram (03/2011): Mild LVH, EF 0000000, grade 1 diastolic dysfunction, mild MR, mild to moderate TR, PASP 42;  b. Echo (05/2013):  Mod LVH, EF 65-70%, no WMA, Gr 1 DD, mild MR, mod TR, PASP 39   Hx of exercise stress test    a. ETT-echocardiogram (06/08/11): Sub-optimal exercise. Test stopped early due to dizziness and hypotension. EF 60%.  Non-diagnostic.;   b. Lexiscan Myoview (06/2013):  Diaphragmatic attenuation, no ischemia, EF 61%.  Low Risk   Hypertension    Hypothyroidism    IBS (irritable bowel syndrome)    Incontinence    OSA (obstructive sleep apnea) 07/20/2016   Mild with AHI 12/hr now on CPAP   Osteoarthritis    Osteopenia    Pacemaker 2009   Brady//Chronotropic incompetence with normal pacemaker function   Thyroid disease    Wears hearing aid    both ears    Patient Active Problem List   Diagnosis Date Noted   Moderate recurrent major depression ([place]) 05/09/2020    Generalized anxiety disorder 05/09/2020   Moderately severe recurrent major depression ([place]) 05/09/2020   Weight loss 10/09/2019   DVT (deep venous thrombosis) ([place]) 03/29/2018   PE (pulmonary thromboembolism) ([place]) 02/03/2018   Macrocytosis 12/21/2017   Wart 12/21/2017   Sleep apnea 07/20/2016   Sigmoid diverticulitis 07/01/2016   Acute diverticulitis    Rectal bleeding 06/30/2016   Impacted cerumen of right ear 03/26/2016   Syncope 03/22/2016   Benign essential HTN 03/22/2016   CAD (coronary artery disease) 03/22/2016   CKD (chronic kidney disease) stage 3, GFR 30-59 ml/min ([place]) 03/22/2016   Depression 03/22/2016   Exertional dyspnea 11/04/2015   Ascending aortic aneurysm ([place]) 11/04/2015   Discoid lupus 10/10/2015   Arthralgia 10/10/2015   Pulmonary hypertension ([place]) 09/30/2015   Mild persistent asthma 09/30/2015   Irritable larynx 09/04/2015   Rotator cuff disorder 03/06/2015   OCD (obsessive compulsive disorder) 04/03/2013   IBS (irritable bowel syndrome) 07/03/2012   Dysphagia 04/13/2012   Denture irritation 12/20/2011   Irritable bladder 12/20/2011   Knee pain, bilateral 12/20/2011   Hearing loss 10/11/2011   Pacemaker-Medtronic 05/18/2011   DOE (dyspnea on exertion) 04/13/2011   Gout, unspecified 04/30/2010   Anal fissure 02/03/2010   Atrial fibrillation ([place]) 08/26/2009   Sinoatrial node dysfunction ([place]) 09/17/2008   HYPOTENSION, ORTHOSTATIC 01/29/2008   Microscopic hematuria  08/21/2007   Pain in joint, pelvic region and thigh 08/01/2007   Vitamin D deficiency 05/23/2007   Anxiety disorder 05/23/2007   Hypothyroidism 03/22/2007   GERD 03/22/2007   Osteoarthritis 03/22/2007   CFS (chronic fatigue syndrome) 11/17/2006   Hypertensive heart disease 11/17/2006   ALLERGIC RHINITIS 11/17/2006   OSTEOPOROSIS 11/17/2006   BREAST CANCER, HX OF 11/17/2006    Past Surgical History:  Procedure Laterality Date   BREAST BIOPSY Left 1995   BREAST LUMPECTOMY Left  1995   axillary node dissectoin   CARDIAC CATHETERIZATION N/A 02/13/2016   Procedure: Right Heart Cath;  Surgeon: Larey Dresser, MD;  Location: [facility];  Service: Cardiovascular;  Laterality: N/A;   CATARACT EXTRACTION W/ INTRAOCULAR LENS  IMPLANT, BILATERAL Bilateral 06/2009 - 12.2915   left - right   DORSAL COMPARTMENT RELEASE  2000   left   HAMMER TOE SURGERY Left 2004   HEMORRHOID BANDING     INSERT / REPLACE / REMOVE PACEMAKER  2009   KNEE ARTHROSCOPY Left 08/29/2012   Procedure: LEFT KNEE ARTHROSCOPY ;  Surgeon: Hessie Dibble, MD;  Location: [facility];  Service: Orthopedics;  Laterality: Left;   MASTECTOMY Bilateral 09/2000   nbx   PPM GENERATOR CHANGEOUT N/A 07/16/2019   Procedure: PPM GENERATOR CHANGEOUT;  Surgeon: Deboraha Sprang, MD;  Location: [facility];  Service: Cardiovascular;  Laterality: N/A;   TRIGGER FINGER RELEASE Right 07/31/2013   Procedure: EXCISE MASS RIGHT INDEX A-1 PLLLEY RELEASE A-1 RIGHT INDEX;  Surgeon: Cammie Sickle., MD;  Location: [facility];  Service: Orthopedics;  Laterality: Right;   VAGINAL HYSTERECTOMY      Prior to Admission medications   Medication Sig Start Date End Date Taking? Authorizing Provider  ALPRAZolam Duanne Moron) 0.5 MG tablet Take 0.5 mg by mouth 2 (two) times daily as needed for anxiety.    [provider]  cephALEXin (KEFLEX) 500 MG capsule Take 1 capsule (500 mg total) by mouth 2 (two) times daily for 7 days. 11/24/20 12/01/20  Jahki Witham, Johnette Abraham B, FNP  divalproex (DEPAKOTE SPRINKLE) 125 MG capsule Take 125 mg by mouth 2 (two) times daily.    [provider]  ELIQUIS 2.5 MG TABS tablet Take 2.5 mg by mouth 2 (two) times daily. 10/03/20   [provider]  FLUoxetine (PROZAC) 10 MG tablet Take 10 mg by mouth daily. 10/17/20   [provider]  hydrOXYzine (VISTARIL) 25 MG capsule Take 25 mg by mouth 3 (three) times daily as needed for anxiety or agitation.  09/25/20   [provider]  levothyroxine (SYNTHROID) 50 MCG tablet Take 50 mcg by mouth daily. 09/24/20   [provider]  melatonin 5 MG TABS Take 5 mg by mouth at bedtime.    [provider]  pantoprazole (PROTONIX) 40 MG tablet Take 1 tablet (40 mg total) by mouth daily. 07/31/20   Virgel Manifold, MD  polyethylene glycol (MIRALAX MIX-IN PAX) 17 g packet Take 17 g by mouth daily. 10/14/20 01/12/21  Virgel Manifold, MD  traZODone (DESYREL) 50 MG tablet Take 25 mg by mouth at bedtime as needed. 09/23/20   [provider]    Allergies Lactase, Amlodipine besy-benazepril hcl, Amlodipine besylate, Clonidine hydrochloride, Lactose intolerance (gi), Valsartan, and Verapamil  Family History  Problem Relation Age of Onset   Ovarian cancer Mother    Heart disease Father    Stroke Sister    Stroke Brother  Cancer Brother    Heart disease Brother     Social History Social History   Tobacco Use   Smoking status: Never   Smokeless tobacco: Never  Vaping Use   Vaping Use: Never used  Substance Use Topics   Alcohol use: No    Alcohol/week: 0.0 standard drinks   Drug use: No    Review of Systems  Constitutional: No chills Eyes: No visual changes. ENT: No sore throat. Cardiovascular: Denies chest pain. Respiratory: Denies shortness of breath. Gastrointestinal: Positive for abdominal pain.  No nausea, no vomiting.  No diarrhea.  No constipation. Genitourinary: Negative for dysuria. Musculoskeletal: Negative for back pain. Skin: Negative for rash. Neurological: Negative for headaches, focal weakness or numbness. ____________________________________________   PHYSICAL EXAM:  VITAL SIGNS: ED Triage Vitals  Enc Vitals Group     BP 11/24/20 1015 126/70     Pulse Rate 11/24/20 1015 61     Resp 11/24/20 1015 18     Temp 11/24/20 1015 98.7 F (37.1 C)     Temp Source 11/24/20 1015 Oral     SpO2 11/24/20 1015 100 %     Weight 11/24/20 1016  110 lb 0.2 oz (49.9 kg)     Height 11/24/20 1016 '5\' 4"'$  (1.626 m)     Head Circumference --      Peak Flow --      Pain Score --      Pain Loc --      Pain Edu? --      Excl. in [place]? --     Constitutional: Alert and oriented. Chronically ill appearing and in no acute distress. Generalized weakness. Eyes: Conjunctivae are normal Head: Atraumatic. Nose: No congestion/rhinnorhea. Mouth/Throat: Mucous membranes are moist. Oropharynx non-erythematous. Neck: No stridor.   Hematological/Lymphatic/Immunilogical: No cervical lymphadenopathy. Cardiovascular: Normal rate, regular rhythm. Grossly normal heart sounds.  Good peripheral circulation. Respiratory: Normal respiratory effort.  No retractions. Lungs CTAB. Gastrointestinal: Soft and nontender. No distention. No abdominal bruits. Genitourinary:  Musculoskeletal: No lower extremity tenderness nor edema.  No joint effusions. Neurologic:  Normal speech and language. No gross focal neurologic deficits are appreciated. No gait instability. Skin:  Skin is warm, dry and intact. No rash noted. Psychiatric: Mood and affect are normal. Speech and behavior are normal.  ____________________________________________   LABS (all labs ordered are listed, but only abnormal results are displayed)  Labs Reviewed  CBC WITH DIFFERENTIAL/PLATELET - Abnormal; Notable for the following components:      Result Value   WBC 3.9 (*)    RBC 3.16 (*)    Hemoglobin 10.9 (*)    HCT 32.6 (*)    MCV 103.2 (*)    MCH 34.5 (*)    Platelets 98 (*)    Lymphs Abs 0.6 (*)    All other components within normal limits  COMPREHENSIVE METABOLIC PANEL - Abnormal; Notable for the following components:   Potassium 3.3 (*)    Glucose, Bld 62 (*)    BUN 34 (*)    Creatinine, Ser 1.03 (*)    Calcium 8.5 (*)    Total Protein 6.2 (*)    Albumin 3.2 (*)    GFR, Estimated 52 (*)    All other components within normal limits  URINALYSIS, COMPLETE (UACMP) WITH MICROSCOPIC -  Abnormal; Notable for the following components:   Color, Urine AMBER (*)    APPearance CLOUDY (*)    Hgb urine dipstick LARGE (*)    Ketones, ur 20 (*)  Protein, ur 30 (*)    Leukocytes,Ua LARGE (*)    WBC, UA >50 (*)    Bacteria, UA RARE (*)    All other components within normal limits  TROPONIN I (HIGH SENSITIVITY)   ____________________________________________  EKG  ED ECG REPORT I, Chianna Spirito, FNP-BC personally viewed and interpreted this ECG.   Date: 11/24/2020  EKG Time: 1021  Rate: 67  Rhythm: normal EKG, normal sinus rhythm  Axis: normal  Intervals:left anterior fascicular block  ST&T Change: normal  ____________________________________________  RADIOLOGY  ED MD interpretation:    No acute changes from previous imaging.  I, Sherrie George, personally viewed and evaluated these images (plain radiographs) as part of my medical decision making, as well as reviewing the written report by the radiologist.  Official radiology report(s): DG Chest 2 View  Result Date: 11/24/2020 CLINICAL DATA:  weakness EXAM: CHEST - 2 VIEW Multiple priors, most recent July 25, 2019. FINDINGS: Similar cardiomediastinal silhouette. Calcific atherosclerosis aorta. Right subclavian approach dual lead cardiac rhythm maintenance device in similar position. No consolidation. Blunting of the posterior/right costophrenic sulcus, particularly on the lateral. This is favored to represent pleural thickening/scarring (over small pleural effusion) when correlating with prior CT abdomen/pelvis from 11/07/2018. No visible pneumothorax. Left axillary clips. Osteopenia and polyarticular degenerative change. IMPRESSION: 1. No evidence of acute cardiopulmonary disease. 2. Chronic blunting of the posterior/right costophrenic sulcus, particularly on the lateral. This is favored to represent pleural thickening/scarring (over small pleural effusion) when correlating with prior CT abdomen/pelvis from 11/07/2018.  Electronically Signed   By: Margaretha Sheffield MD   On: 11/24/2020 11:51    ____________________________________________   PROCEDURES  Procedure(s) performed (including Critical Care):  Procedures  ____________________________________________   INITIAL IMPRESSION / ASSESSMENT AND PLAN     DIFFERENTIAL DIAGNOSIS  Failure to thrive, UTI, electrolyte disturbance, cardiac event  ED COURSE  ----------------------------------------- 11:29 AM on 11/24/2020 ----------------------------------------- Per staff at [facility], no new falls or injuries since discharge from the ER yesterday. MD evaluated her today and sent her to the ER for progressive weakness beyond her baseline.  ----------------------------------------- 12:01 PM on 11/24/2020 ----------------------------------------- Patient states that she feels weak and has had decreased appetite since coming to her current facility. She reports that she was Vegan for over 20 years and now has to eat meat, which she believes is making her feel bad.   ----------------------------------------- 12:50 PM on 11/24/2020 ----------------------------------------- Urinalysis consistent with UTI. She was  given Rocephin while here and will go back to the facility on Keflex.   ___________________________________________   FINAL CLINICAL IMPRESSION(S) / ED DIAGNOSES  Final diagnoses:  Acute cystitis without hematuria     ED Discharge Orders          Ordered    cephALEXin (KEFLEX) 500 MG capsule  2 times daily,   Status:  Discontinued        11/24/20 1248    cephALEXin (KEFLEX) 500 MG capsule  2 times daily        11/24/20 1343             [name] was evaluated in Emergency Department on 11/24/2020 for the symptoms described in the history of present illness. She was evaluated in the context of the global COVID-19 pandemic, which necessitated consideration that the patient might be at risk for infection with the  SARS-CoV-2 virus that causes COVID-19. Institutional protocols and algorithms that pertain to the evaluation of patients at risk for COVID-19 are in a state of rapid  change based on information released by regulatory bodies including the CDC and federal and state organizations. These policies and algorithms were followed during the patient's care in the ED.   Note:  This document was prepared using Dragon voice recognition software and may include unintentional dictation errors.    Victorino Dike, FNP 11/24/20 1559    Blake Divine, MD 11/25/20 [phone]

## 2020-11-24 NOTE — ED Notes (Signed)
U/A attained by this RN via in and out 11:49 catheterization.

## 2020-11-24 NOTE — ED Notes (Signed)
Pt in XR. 

## 2020-11-24 NOTE — ED Notes (Signed)
Pt provided a clean brief at D/C

## 2020-11-24 NOTE — Discharge Instructions (Addendum)
Take the antibiotic as prescribed.  See primary care in 10 days for a repeat urinalysis or sooner for symptoms that seem to be worse.   Return to the ER for symptoms that change or worsen if unable to see primary care.

## 2020-11-24 NOTE — ED Notes (Signed)
[facility] called for EMS transport

## 2020-11-26 ENCOUNTER — Inpatient Hospital Stay
Admission: EM | Admit: 2020-11-26 | Discharge: 2020-12-01 | DRG: 689 | Disposition: A | Discharge status: Skilled Nursing Facility | Payer: Medicare Other | Source: Skilled Nursing Facility | Attending: Internal Medicine | Admitting: Internal Medicine

## 2020-11-26 ENCOUNTER — Other Ambulatory Visit: Payer: Self-pay

## 2020-11-26 ENCOUNTER — Emergency Department: Payer: Medicare Other

## 2020-11-26 DIAGNOSIS — F419 Anxiety disorder, unspecified: Secondary | ICD-10-CM | POA: Diagnosis not present

## 2020-11-26 DIAGNOSIS — E43 Unspecified severe protein-calorie malnutrition: Secondary | ICD-10-CM | POA: Insufficient documentation

## 2020-11-26 DIAGNOSIS — I129 Hypertensive chronic kidney disease with stage 1 through stage 4 chronic kidney disease, or unspecified chronic kidney disease: Secondary | ICD-10-CM | POA: Diagnosis present

## 2020-11-26 DIAGNOSIS — R531 Weakness: Secondary | ICD-10-CM | POA: Diagnosis present

## 2020-11-26 DIAGNOSIS — N183 Chronic kidney disease, stage 3 unspecified: Secondary | ICD-10-CM | POA: Diagnosis present

## 2020-11-26 DIAGNOSIS — Z20822 Contact with and (suspected) exposure to covid-19: Secondary | ICD-10-CM | POA: Diagnosis present

## 2020-11-26 DIAGNOSIS — Z8249 Family history of ischemic heart disease and other diseases of the circulatory system: Secondary | ICD-10-CM

## 2020-11-26 DIAGNOSIS — M25522 Pain in left elbow: Secondary | ICD-10-CM | POA: Diagnosis present

## 2020-11-26 DIAGNOSIS — E46 Unspecified protein-calorie malnutrition: Secondary | ICD-10-CM | POA: Diagnosis present

## 2020-11-26 DIAGNOSIS — F32A Depression, unspecified: Secondary | ICD-10-CM | POA: Diagnosis present

## 2020-11-26 DIAGNOSIS — I2510 Atherosclerotic heart disease of native coronary artery without angina pectoris: Secondary | ICD-10-CM | POA: Diagnosis present

## 2020-11-26 DIAGNOSIS — L89152 Pressure ulcer of sacral region, stage 2: Secondary | ICD-10-CM | POA: Diagnosis present

## 2020-11-26 DIAGNOSIS — Z823 Family history of stroke: Secondary | ICD-10-CM

## 2020-11-26 DIAGNOSIS — N3000 Acute cystitis without hematuria: Secondary | ICD-10-CM

## 2020-11-26 DIAGNOSIS — R410 Disorientation, unspecified: Secondary | ICD-10-CM | POA: Diagnosis not present

## 2020-11-26 DIAGNOSIS — I444 Left anterior fascicular block: Secondary | ICD-10-CM | POA: Diagnosis present

## 2020-11-26 DIAGNOSIS — I4891 Unspecified atrial fibrillation: Secondary | ICD-10-CM | POA: Diagnosis present

## 2020-11-26 DIAGNOSIS — Z66 Do not resuscitate: Secondary | ICD-10-CM | POA: Diagnosis present

## 2020-11-26 DIAGNOSIS — E039 Hypothyroidism, unspecified: Secondary | ICD-10-CM | POA: Diagnosis present

## 2020-11-26 DIAGNOSIS — Z950 Presence of cardiac pacemaker: Secondary | ICD-10-CM

## 2020-11-26 DIAGNOSIS — Z853 Personal history of malignant neoplasm of breast: Secondary | ICD-10-CM | POA: Diagnosis not present

## 2020-11-26 DIAGNOSIS — Z7901 Long term (current) use of anticoagulants: Secondary | ICD-10-CM | POA: Diagnosis not present

## 2020-11-26 DIAGNOSIS — N390 Urinary tract infection, site not specified: Principal | ICD-10-CM | POA: Diagnosis present

## 2020-11-26 DIAGNOSIS — Z8041 Family history of malignant neoplasm of ovary: Secondary | ICD-10-CM

## 2020-11-26 DIAGNOSIS — R4182 Altered mental status, unspecified: Secondary | ICD-10-CM | POA: Diagnosis present

## 2020-11-26 DIAGNOSIS — W19XXXA Unspecified fall, initial encounter: Secondary | ICD-10-CM | POA: Diagnosis not present

## 2020-11-26 DIAGNOSIS — E739 Lactose intolerance, unspecified: Secondary | ICD-10-CM | POA: Diagnosis present

## 2020-11-26 DIAGNOSIS — N1831 Chronic kidney disease, stage 3a: Secondary | ICD-10-CM | POA: Diagnosis present

## 2020-11-26 DIAGNOSIS — Z681 Body mass index (BMI) 19 or less, adult: Secondary | ICD-10-CM | POA: Diagnosis not present

## 2020-11-26 DIAGNOSIS — I10 Essential (primary) hypertension: Secondary | ICD-10-CM | POA: Diagnosis present

## 2020-11-26 DIAGNOSIS — R627 Adult failure to thrive: Secondary | ICD-10-CM | POA: Diagnosis present

## 2020-11-26 DIAGNOSIS — F320 Major depressive disorder, single episode, mild: Secondary | ICD-10-CM | POA: Diagnosis not present

## 2020-11-26 DIAGNOSIS — L8990 Pressure ulcer of unspecified site, unspecified stage: Secondary | ICD-10-CM | POA: Insufficient documentation

## 2020-11-26 DIAGNOSIS — R296 Repeated falls: Secondary | ICD-10-CM | POA: Diagnosis present

## 2020-11-26 DIAGNOSIS — K219 Gastro-esophageal reflux disease without esophagitis: Secondary | ICD-10-CM | POA: Diagnosis present

## 2020-11-26 DIAGNOSIS — R404 Transient alteration of awareness: Secondary | ICD-10-CM | POA: Diagnosis not present

## 2020-11-26 DIAGNOSIS — R5381 Other malaise: Secondary | ICD-10-CM | POA: Diagnosis not present

## 2020-11-26 DIAGNOSIS — U071 COVID-19: Secondary | ICD-10-CM | POA: Diagnosis present

## 2020-11-26 DIAGNOSIS — Z7401 Bed confinement status: Secondary | ICD-10-CM | POA: Diagnosis not present

## 2020-11-26 LAB — CBC WITH DIFFERENTIAL/PLATELET
Abs Immature Granulocytes: 0.12 10*3/uL — ABNORMAL HIGH (ref 0.00–0.07)
Basophils Absolute: 0 10*3/uL (ref 0.0–0.1)
Basophils Relative: 0 %
Eosinophils Absolute: 0 10*3/uL (ref 0.0–0.5)
Eosinophils Relative: 0 %
HCT: 33.3 % — ABNORMAL LOW (ref 36.0–46.0)
Hemoglobin: 11.2 g/dL — ABNORMAL LOW (ref 12.0–15.0)
Immature Granulocytes: 3 %
Lymphocytes Relative: 11 %
Lymphs Abs: 0.5 10*3/uL — ABNORMAL LOW (ref 0.7–4.0)
MCH: 34.5 pg — ABNORMAL HIGH (ref 26.0–34.0)
MCHC: 33.6 g/dL (ref 30.0–36.0)
MCV: 102.5 fL — ABNORMAL HIGH (ref 80.0–100.0)
Monocytes Absolute: 0.4 10*3/uL (ref 0.1–1.0)
Monocytes Relative: 10 %
Neutro Abs: 3.3 10*3/uL (ref 1.7–7.7)
Neutrophils Relative %: 76 %
Platelets: 119 10*3/uL — ABNORMAL LOW (ref 150–400)
RBC: 3.25 MIL/uL — ABNORMAL LOW (ref 3.87–5.11)
RDW: 14.5 % (ref 11.5–15.5)
WBC: 4.4 10*3/uL (ref 4.0–10.5)
nRBC: 0 % (ref 0.0–0.2)

## 2020-11-26 LAB — COMPREHENSIVE METABOLIC PANEL
ALT: 17 U/L (ref 0–44)
AST: 25 U/L (ref 15–41)
Albumin: 3 g/dL — ABNORMAL LOW (ref 3.5–5.0)
Alkaline Phosphatase: 42 U/L (ref 38–126)
Anion gap: 14 (ref 5–15)
BUN: 41 mg/dL — ABNORMAL HIGH (ref 8–23)
CO2: 25 mmol/L (ref 22–32)
Calcium: 9.2 mg/dL (ref 8.9–10.3)
Chloride: 106 mmol/L (ref 98–111)
Creatinine, Ser: 1.15 mg/dL — ABNORMAL HIGH (ref 0.44–1.00)
GFR, Estimated: 46 mL/min — ABNORMAL LOW (ref >60)
Glucose, Bld: 86 mg/dL (ref 70–99)
Potassium: 3.6 mmol/L (ref 3.5–5.1)
Sodium: 145 mmol/L (ref 135–145)
Total Bilirubin: 1.2 mg/dL (ref 0.3–1.2)
Total Protein: 6.2 g/dL — ABNORMAL LOW (ref 6.5–8.1)

## 2020-11-26 LAB — URINALYSIS, COMPLETE (UACMP) WITH MICROSCOPIC
Bilirubin Urine: NEGATIVE
Glucose, UA: NEGATIVE mg/dL
Ketones, ur: 20 mg/dL — AB
Nitrite: NEGATIVE
Protein, ur: 30 mg/dL — AB
RBC / HPF: >50 RBC/hpf — ABNORMAL HIGH (ref 0–5)
Specific Gravity, Urine: 1.024 (ref 1.005–1.030)
pH: 5 (ref 5.0–8.0)

## 2020-11-26 LAB — LACTIC ACID, PLASMA: Lactic Acid, Venous: 0.9 mmol/L (ref 0.5–1.9)

## 2020-11-26 LAB — TROPONIN I (HIGH SENSITIVITY)
Troponin I (High Sensitivity): 15 ng/L (ref <18)
Troponin I (High Sensitivity): 16 ng/L (ref <18)

## 2020-11-26 LAB — RESP PANEL BY RT-PCR (FLU A&B, COVID) ARPGX2
Influenza A by PCR: NEGATIVE
Influenza B by PCR: NEGATIVE
SARS Coronavirus 2 by RT PCR: NEGATIVE

## 2020-11-26 LAB — CK: Total CK: 397 U/L — ABNORMAL HIGH (ref 38–234)

## 2020-11-26 LAB — LIPASE, BLOOD: Lipase: 21 U/L (ref 11–51)

## 2020-11-26 MED ORDER — SODIUM CHLORIDE 0.9 % IV SOLN: INTRAVENOUS | Status: AC

## 2020-11-26 MED ORDER — ONDANSETRON HCL 4 MG/2ML IJ SOLN: 4.0000 mg | Freq: Four times a day (QID) | INTRAMUSCULAR | Status: DC | PRN

## 2020-11-26 MED ORDER — POLYETHYLENE GLYCOL 3350 17 G PO PACK
17.0000 g | PACK | Freq: Every day | ORAL | Status: DC
  Administered 2020-11-27 – 2020-12-01 (×5): 17 g via ORAL
  Filled 2020-11-26 (×5): qty 1

## 2020-11-26 MED ORDER — ONDANSETRON HCL 4 MG PO TABS: 4.0000 mg | ORAL_TABLET | Freq: Four times a day (QID) | ORAL | Status: DC | PRN

## 2020-11-26 MED ORDER — SODIUM CHLORIDE 0.9 % IV SOLN
2.0000 g | Freq: Once | INTRAVENOUS | Status: AC
  Administered 2020-11-26: 2 g via INTRAVENOUS
  Filled 2020-11-26: qty 2

## 2020-11-26 MED ORDER — FLUOXETINE HCL 20 MG PO CAPS
20.0000 mg | ORAL_CAPSULE | Freq: Every day | ORAL | Status: DC
  Administered 2020-11-27 – 2020-12-01 (×5): 20 mg via ORAL
  Filled 2020-11-26 (×5): qty 1

## 2020-11-26 MED ORDER — LACTATED RINGERS IV BOLUS
1000.0000 mL | Freq: Once | INTRAVENOUS | Status: AC
  Administered 2020-11-26: 1000 mL via INTRAVENOUS

## 2020-11-26 MED ORDER — ACETAMINOPHEN 650 MG RE SUPP: 650.0000 mg | Freq: Four times a day (QID) | RECTAL | Status: DC | PRN

## 2020-11-26 MED ORDER — DIVALPROEX SODIUM 125 MG PO CSDR
125.0000 mg | DELAYED_RELEASE_CAPSULE | Freq: Two times a day (BID) | ORAL | Status: DC
  Administered 2020-11-26 – 2020-12-01 (×10): 125 mg via ORAL
  Filled 2020-11-26 (×11): qty 1

## 2020-11-26 MED ORDER — APIXABAN 2.5 MG PO TABS
2.5000 mg | ORAL_TABLET | Freq: Two times a day (BID) | ORAL | Status: DC
  Administered 2020-11-26 – 2020-12-01 (×10): 2.5 mg via ORAL
  Filled 2020-11-26 (×11): qty 1

## 2020-11-26 MED ORDER — TRAZODONE HCL 50 MG PO TABS
25.0000 mg | ORAL_TABLET | Freq: Every evening | ORAL | Status: DC | PRN
  Administered 2020-11-28: 25 mg via ORAL
  Filled 2020-11-26: qty 1

## 2020-11-26 MED ORDER — PANTOPRAZOLE SODIUM 40 MG PO TBEC
40.0000 mg | DELAYED_RELEASE_TABLET | Freq: Every day | ORAL | Status: DC
  Administered 2020-11-27 – 2020-12-01 (×5): 40 mg via ORAL
  Filled 2020-11-26 (×5): qty 1

## 2020-11-26 MED ORDER — MELATONIN 5 MG PO TABS
5.0000 mg | ORAL_TABLET | Freq: Every day | ORAL | Status: DC
  Administered 2020-11-26 – 2020-11-30 (×5): 5 mg via ORAL
  Filled 2020-11-26 (×5): qty 1

## 2020-11-26 MED ORDER — ALPRAZOLAM 0.5 MG PO TABS: 0.5000 mg | ORAL_TABLET | Freq: Two times a day (BID) | ORAL | Status: DC | PRN

## 2020-11-26 MED ORDER — SODIUM CHLORIDE 0.9 % IV SOLN
1.0000 g | INTRAVENOUS | Status: AC
  Administered 2020-11-27 – 2020-11-29 (×3): 1 g via INTRAVENOUS
  Filled 2020-11-26: qty 10
  Filled 2020-11-26 (×2): qty 1

## 2020-11-26 MED ORDER — ACETAMINOPHEN 325 MG PO TABS: 650.0000 mg | ORAL_TABLET | Freq: Four times a day (QID) | ORAL | Status: DC | PRN

## 2020-11-26 MED ORDER — ORAL CARE MOUTH RINSE
15.0000 mL | Freq: Two times a day (BID) | OROMUCOSAL | Status: DC
  Administered 2020-11-27 – 2020-12-01 (×9): 15 mL via OROMUCOSAL

## 2020-11-26 MED ORDER — LEVOTHYROXINE SODIUM 50 MCG PO TABS
50.0000 ug | ORAL_TABLET | Freq: Every day | ORAL | Status: DC
  Administered 2020-11-28 – 2020-12-01 (×4): 50 ug via ORAL
  Filled 2020-11-26 (×4): qty 1

## 2020-11-26 NOTE — ED Triage Notes (Addendum)
Pt to ER via [facility] from [facility] with complaints of weakness/ failure to thrive.   EMS report patient was recently seen and diagnosed with a UTI, pt has significantly worsened with her mental status since. Facility report administering the one antibiotic as prescribed. Report patient is usually ambulatory and eats well however over the last two days is extremely lethargic and unwilling to eat.   Ems VSS- temp 99.7 oral, cbg 100, bp 120/60, hr 60, O2 sats on RA 97%.

## 2020-11-26 NOTE — H&P (Addendum)
History and Physical    [name] [id number] DOB: May 31, 1929 DOA: 11/26/2020  PCP: Cassandria Anger, MD   Patient coming from: [name] have personally briefly reviewed patient's old medical records in [place]  Chief Complaint: Change in mental status  HPI: [name] is a 87 y.o. female with medical history significant for hypertension, chronic kidney disease, CHF, depression and anxiety who presents to the ER for the third time in the last week. She was initially seen in the emergency room on July 28 for evaluation of a fall.  She was seen again on July 31 for evaluation of a fall and at that time had pyuria.  She received a dose of Rocephin and was discharged back to the skilled nursing facility on Keflex.  Per nursing staff she only had 1 tablet of the Keflex and has not been able to take any more tablets because of poor oral intake.   Per nursing home staff she has refused intake of medication and food and also appears to be more confused when compared to her baseline. I am unable to do a review of systems on this patient due to her altered mental status. Labs show sodium 145, potassium 3.6, chloride 106, bicarb 25, glucose 86, BUN 41, creatinine 1.15, calcium 9.2, alkaline phosphatase 42, albumin 3.0, lipase 21, AST 25, ALT 17, total protein 6.2, troponin 15 >> 16, lactic acid 0.9, white count 4.4, hemoglobin 11.2, hematocrit 33.3, MCV 92.5, RDW 14.5, platelet count 119 Respiratory viral panel is negative Urine analysis shows trace LE, greater than 50 RBCs CT scan of the head without contrast shows no acute or interval finding.  Generalized atrophy. Twelve-lead EKG reviewed by me shows normal sinus rhythm, left anterior fascicular block, T wave inversions in the lateral leads.     ED Course: Patient is an 87 year old African-American female who was sent to the ER from [facility] for evaluation of weakness and failure to thrive. Per nursing home staff  patient was diagnosed with a UTI 2 days prior and over the last couple of days she has refused all oral intake and is noted to have become increasingly confused. She had been seen twice in the ER for evaluation of frequent falls. She will be admitted to the hospital for further evaluation.    Review of Systems: As per HPI otherwise all other systems reviewed and negative.    Past Medical History:  Diagnosis Date   Anxiety    Bradycardia    Breast cancer, left breast ([place]) 1995   Breast cancer, right breast ([place]) 2002   CHF (congestive heart failure) (HCC)    Depression    Dr Cheryln Manly   Discoid lupus    skin   Full dentures    GERD (gastroesophageal reflux disease)    Glaucoma    Gout    Hx of echocardiogram    a.  Echocardiogram (03/2011): Mild LVH, EF 0000000, grade 1 diastolic dysfunction, mild MR, mild to moderate TR, PASP 42;  b. Echo (05/2013):  Mod LVH, EF 65-70%, no WMA, Gr 1 DD, mild MR, mod TR, PASP 39   Hx of exercise stress test    a. ETT-echocardiogram (06/08/11): Sub-optimal exercise. Test stopped early due to dizziness and hypotension. EF 60%.  Non-diagnostic.;   b. Lexiscan Myoview (06/2013):  Diaphragmatic attenuation, no ischemia, EF 61%.  Low Risk   Hypertension    Hypothyroidism    IBS (irritable bowel syndrome)    Incontinence  OSA (obstructive sleep apnea) 07/20/2016   Mild with AHI 12/hr now on CPAP   Osteoarthritis    Osteopenia    Pacemaker 2009   Brady//Chronotropic incompetence with normal pacemaker function   Thyroid disease    Wears hearing aid    both ears    Past Surgical History:  Procedure Laterality Date   BREAST BIOPSY Left 1995   BREAST LUMPECTOMY Left 1995   axillary node dissectoin   CARDIAC CATHETERIZATION N/A 02/13/2016   Procedure: Right Heart Cath;  Surgeon: Larey Dresser, MD;  Location: [facility];  Service: Cardiovascular;  Laterality: N/A;   CATARACT EXTRACTION W/ INTRAOCULAR LENS  IMPLANT, BILATERAL Bilateral  06/2009 - 12.2915   left - right   DORSAL COMPARTMENT RELEASE  2000   left   HAMMER TOE SURGERY Left 2004   HEMORRHOID BANDING     INSERT / REPLACE / REMOVE PACEMAKER  2009   KNEE ARTHROSCOPY Left 08/29/2012   Procedure: LEFT KNEE ARTHROSCOPY ;  Surgeon: Hessie Dibble, MD;  Location: [facility];  Service: Orthopedics;  Laterality: Left;   MASTECTOMY Bilateral 09/2000   nbx   PPM GENERATOR CHANGEOUT N/A 07/16/2019   Procedure: PPM GENERATOR CHANGEOUT;  Surgeon: Deboraha Sprang, MD;  Location: [facility];  Service: Cardiovascular;  Laterality: N/A;   TRIGGER FINGER RELEASE Right 07/31/2013   Procedure: EXCISE MASS RIGHT INDEX A-1 PLLLEY RELEASE A-1 RIGHT INDEX;  Surgeon: Cammie Sickle., MD;  Location: [facility];  Service: Orthopedics;  Laterality: Right;   VAGINAL HYSTERECTOMY       reports that she has never smoked. She has never used smokeless tobacco. She reports that she does not drink alcohol and does not use drugs.  Allergies  Allergen Reactions   Lactase Diarrhea   Amlodipine Besy-Benazepril Hcl Swelling   Amlodipine Besylate Swelling   Clonidine Hydrochloride     REACTION: tired   Lactose Intolerance (Gi) Diarrhea and Other (See Comments)   Valsartan Other (See Comments)    Tongue swelling    Verapamil Other (See Comments)    unknown    Family History  Problem Relation Age of Onset   Ovarian cancer Mother    Heart disease Father    Stroke Sister    Stroke Brother    Cancer Brother    Heart disease Brother       Prior to Admission medications   Medication Sig Start Date End Date Taking? Authorizing Provider  cephALEXin (KEFLEX) 500 MG capsule Take 1 capsule (500 mg total) by mouth 2 (two) times daily for 7 days. 11/24/20 12/01/20 Yes Triplett, Cari B, FNP  clidinium-chlordiazePOXIDE (LIBRAX) 5-2.5 MG capsule Take by mouth. 11/07/20  Yes [provider]  divalproex (DEPAKOTE SPRINKLE) 125 MG capsule Take 125 mg by  mouth. 1 cap in am and 2 caps in pm   Yes [provider]  ELIQUIS 2.5 MG TABS tablet Take 2.5 mg by mouth 2 (two) times daily. 10/03/20  Yes [provider]  FLUoxetine (PROZAC) 10 MG tablet Take 20 mg by mouth daily. 10/17/20  Yes [provider]  levothyroxine (SYNTHROID) 50 MCG tablet Take 50 mcg by mouth daily. 09/24/20  Yes [provider]  melatonin 5 MG TABS Take 5 mg by mouth at bedtime.   Yes [provider]  pantoprazole (PROTONIX) 40 MG tablet Take 1 tablet (40 mg total) by mouth daily. 07/31/20  Yes Virgel Manifold, MD  polyethylene glycol (  MIRALAX MIX-IN PAX) 17 g packet Take 17 g by mouth daily. 10/14/20 01/12/21 Yes Vonda Antigua B, MD  risperiDONE (RISPERDAL) 0.5 MG tablet Take by mouth. 11/06/20  Yes [provider]  ALPRAZolam Duanne Moron) 0.5 MG tablet Take 0.5 mg by mouth 2 (two) times daily as needed for anxiety.    [provider]  hydrOXYzine (VISTARIL) 25 MG capsule Take 25 mg by mouth 3 (three) times daily as needed for anxiety or agitation. Patient not taking: Reported on 11/26/2020 09/25/20   [provider]  traZODone (DESYREL) 50 MG tablet Take 25 mg by mouth at bedtime as needed. 09/23/20   [provider]    Physical Exam: Vitals:   11/26/20 1015 11/26/20 1030 11/26/20 1100 11/26/20 1130  BP:  (!) 145/70 (!) 159/81 137/61  Pulse: 62 60 (!) 52 65  Resp: '15 14 14 15  '$ Temp:      TempSrc:      SpO2: 99% 99% 97% 98%  Weight:      Height:         Vitals:   11/26/20 1015 11/26/20 1030 11/26/20 1100 11/26/20 1130  BP:  (!) 145/70 (!) 159/81 137/61  Pulse: 62 60 (!) 52 65  Resp: '15 14 14 15  '$ Temp:      TempSrc:      SpO2: 99% 99% 97% 98%  Weight:      Height:          Constitutional: Alert and oriented x 1, to person not to place or time. Not in any apparent distress.  Frail HEENT:      Head: Normocephalic and atraumatic.         Eyes: PERLA, EOMI, Conjunctivae are normal.  Sclera is non-icteric.       Mouth/Throat: Mucous membranes are moist.       Neck: Supple with no signs of meningismus. Cardiovascular: Regular rate and rhythm. No murmurs, gallops, or rubs. 2+ symmetrical distal pulses are present . No JVD. No LE edema Respiratory: Respiratory effort normal .Lungs sounds clear bilaterally. No wheezes, crackles, or rhonchi.  Gastrointestinal: Soft, non tender, and non distended with positive bowel sounds.  Genitourinary: No CVA tenderness. Musculoskeletal: Nontender with normal range of motion in all extremities. No cyanosis, or erythema of extremities. Neurologic:  Face is symmetric. Moving all extremities. No gross focal neurologic deficits .  Generalized weakness Skin: Skin is warm, dry.  No rash or ulcers Psychiatric: Mood and affect are normal    Labs on Admission: I have personally reviewed following labs and imaging studies  CBC: Recent Labs  Lab 11/20/20 0341 11/23/20 0725 11/24/20 1022 11/26/20 1015  WBC 1.9* 5.3 3.9* 4.4  NEUTROABS 0.9* 4.0 2.8 3.3  HGB 11.1* 11.6* 10.9* 11.2*  HCT 32.7* 35.1* 32.6* 33.3*  MCV 103.2* 105.4* 103.2* 102.5*  PLT 106* 93* 98* [id number]*   Basic Metabolic Panel: Recent Labs  Lab 11/20/20 0341 11/23/20 0725 11/24/20 1022 11/26/20 1015  NA 140 142 142 145  K 3.2* 4.6 3.3* 3.6  CL 105 103 104 106  CO2 '31 23 26 25  '$ GLUCOSE 80 50* 62* 86  BUN 22 31* 34* 41*  CREATININE 1.06* 1.17* 1.03* 1.15*  CALCIUM 8.8* 8.6* 8.5* 9.2   GFR: Estimated Creatinine Clearance: 25.7 mL/min (A) (by C-G formula based on SCr of 1.15 mg/dL (H)). Liver Function Tests: Recent Labs  Lab 11/20/20 0341 11/24/20 1022 11/26/20 1015  AST '21 23 25  '$ ALT '15 13 17  '$ ALKPHOS 39 40  42  BILITOT 0.9 1.1 1.2  PROT 6.0* 6.2* 6.2*  ALBUMIN 3.4* 3.2* 3.0*   Recent Labs  Lab 11/20/20 0341 11/26/20 1015  LIPASE 20 21   No results for input(s): AMMONIA in the last 168 hours. Coagulation Profile: No results for input(s): INR, PROTIME in  the last 168 hours. Cardiac Enzymes: No results for input(s): CKTOTAL, CKMB, CKMBINDEX, TROPONINI in the last 168 hours. BNP (last 3 results) No results for input(s): PROBNP in the last 8760 hours. HbA1C: No results for input(s): HGBA1C in the last 72 hours. CBG: No results for input(s): GLUCAP in the last 168 hours. Lipid Profile: No results for input(s): CHOL, HDL, LDLCALC, TRIG, CHOLHDL, LDLDIRECT in the last 72 hours. Thyroid Function Tests: No results for input(s): TSH, T4TOTAL, FREET4, T3FREE, THYROIDAB in the last 72 hours. Anemia Panel: No results for input(s): VITAMINB12, FOLATE, FERRITIN, TIBC, IRON, RETICCTPCT in the last 72 hours. Urine analysis:    Component Value Date/Time   COLORURINE YELLOW (A) 11/26/2020 1015   APPEARANCEUR HAZY (A) 11/26/2020 1015   LABSPEC 1.024 11/26/2020 1015   PHURINE 5.0 11/26/2020 1015   GLUCOSEU NEGATIVE 11/26/2020 1015   GLUCOSEU NEGATIVE 10/10/2015 1642   HGBUR LARGE (A) 11/26/2020 1015   BILIRUBINUR NEGATIVE 11/26/2020 1015   BILIRUBINUR neg 10/13/2016 1511   KETONESUR 20 (A) 11/26/2020 1015   PROTEINUR 30 (A) 11/26/2020 1015   UROBILINOGEN 0.2 10/13/2016 1511   UROBILINOGEN 0.2 10/10/2015 1642   NITRITE NEGATIVE 11/26/2020 1015   LEUKOCYTESUR TRACE (A) 11/26/2020 1015    Radiological Exams on Admission: CT Head Wo Contrast  Result Date: 11/26/2020 CLINICAL DATA:  Mental status change with unknown cause. Recently diagnosed UTI EXAM: CT HEAD WITHOUT CONTRAST TECHNIQUE: Contiguous axial images were obtained from the base of the skull through the vertex without intravenous contrast. COMPARISON:  11/23/2020 FINDINGS: Brain: No evidence of acute infarction, hemorrhage, hydrocephalus, extra-axial collection or mass lesion/mass effect. Moderate generalized cortical atrophy. Age normal white matter appearance Vascular: No hyperdense vessel or unexpected calcification. Skull: Normal. Negative for fracture or focal lesion. Sinuses/Orbits: No  acute finding. IMPRESSION: No acute or interval finding. Generalized atrophy. Electronically Signed   By: Monte Fantasia M.D.   On: 11/26/2020 11:08     Assessment/Plan Principal Problem:   AMS (altered mental status) Active Problems:   Benign essential HTN   Hypothyroidism   Anxiety disorder   Atrial fibrillation (HCC)   CKD (chronic kidney disease) stage 3, GFR 30-59 ml/min (HCC)   Depression     Altered mental status Patient presents to the ER for evaluation of confusion At baseline she is usually able oriented to person place and time Patient has a UTI which may account for her mental status change We will place patient empirically on Rocephin 1 g IV daily Expect improvement in her mental status following resolution of her underlying urinary tract infection Will request psychiatry evaluation if mental status does not improve despite adequate treatment of UTI since patient has a history of anxiety and depression and was recently admitted to the psych unit for stabilization of her underlying depression and anxiety.    History of atrial fibrillation Patient is on apixaban for primary prophylaxis for an acute stroke She has a history of frequent falls and the risk versus benefit of long-term anticoagulation therapy will need to be addressed prior to discharge.     Urinary tract infection Patient was seen in the ER 2 days prior to her admission and was noted to have pyuria.  She received a dose of IV Rocephin and was discharged back to the skilled nursing facility on Keflex which she has been unable to take. Will place patient empirically on Rocephin 1 g IV daily Follow-up results of urine culture    Hypothyroidism Continue Synthroid    Anxiety and depression Continue Xanax as needed, Depakote, trazodone and fluoxetine    Frequent falls Place patient on fall precautions PT evaluation once mental status improves   DVT prophylaxis: Apixaban Code Status: DO NOT  RESUSCITATE Family Communication: Greater than 50% of time was spent discussing patient's condition and plan of care with her daughter [name] over the phone.  All questions and concerns have been addressed.  She verbalizes understanding and agrees with the plan. Disposition Plan: Back to previous home environment Consults called:   Status: At the time of admission, it appears that the appropriate admission status for this patient is inpatient. This is judged to be reasonable and necessary in order to provide the required intensity of service to ensure the patient's safety given the presenting symptoms, physical exam findings, and initial radiographic and laboratory data in the context of their comorbid conditions. Patient requires inpatient status due to high intensity of service, high risk for further deterioration and high frequency of surveillance required.  Collier Bullock MD [facility]     11/26/2020, 1:08 PM

## 2020-11-26 NOTE — ED Notes (Signed)
Informed RN bed assigned 

## 2020-11-26 NOTE — ED Provider Notes (Signed)
[facility] Emergency Department Provider Note   ____________________________________________   Event Date/Time   First MD Initiated Contact with Patient 11/26/20 [phone]     (approximate)  I have reviewed the triage vital signs and the nursing notes.   HISTORY  Chief Complaint Weakness    HPI [name] is a 87 y.o. female with past medical history of hypertension, CAD, CKD, atrial fibrillation on Eliquis, and DVT/PE who presents to the ED for altered mental status.  Per EMS, patient has been increasingly somnolent with poor p.o. intake over the past couple of days.  She was initially seen in the ED 3 days ago for a fall, with unremarkable work-up at that time.  She was then seen in the ED 2 days ago for worsening generalized weakness, found to have a UTI and given a dose of Rocephin, discharged to [place] with prescription for Keflex.  She has reportedly only taken 1 dose of the Keflex from the time of discharge, has refused oral intake of medications or food since then.  EMS reports patient afebrile with stable vital signs, patient currently unable to provide any history, mumbling incoherent words.        Past Medical History:  Diagnosis Date   Anxiety    Bradycardia    Breast cancer, left breast ([place]) 1995   Breast cancer, right breast ([place]) 2002   CHF (congestive heart failure) (HCC)    Depression    Dr Cheryln Manly   Discoid lupus    skin   Full dentures    GERD (gastroesophageal reflux disease)    Glaucoma    Gout    Hx of echocardiogram    a.  Echocardiogram (03/2011): Mild LVH, EF 0000000, grade 1 diastolic dysfunction, mild MR, mild to moderate TR, PASP 42;  b. Echo (05/2013):  Mod LVH, EF 65-70%, no WMA, Gr 1 DD, mild MR, mod TR, PASP 39   Hx of exercise stress test    a. ETT-echocardiogram (06/08/11): Sub-optimal exercise. Test stopped early due to dizziness and hypotension. EF 60%.  Non-diagnostic.;   b. Lexiscan Myoview (06/2013):   Diaphragmatic attenuation, no ischemia, EF 61%.  Low Risk   Hypertension    Hypothyroidism    IBS (irritable bowel syndrome)    Incontinence    OSA (obstructive sleep apnea) 07/20/2016   Mild with AHI 12/hr now on CPAP   Osteoarthritis    Osteopenia    Pacemaker 2009   Brady//Chronotropic incompetence with normal pacemaker function   Thyroid disease    Wears hearing aid    both ears    Patient Active Problem List   Diagnosis Date Noted   Moderate recurrent major depression ([place]) 05/09/2020   Generalized anxiety disorder 05/09/2020   Moderately severe recurrent major depression ([place]) 05/09/2020   Weight loss 10/09/2019   DVT (deep venous thrombosis) ([place]) 03/29/2018   PE (pulmonary thromboembolism) ([place]) 02/03/2018   Macrocytosis 12/21/2017   Wart 12/21/2017   Sleep apnea 07/20/2016   Sigmoid diverticulitis 07/01/2016   Acute diverticulitis    Rectal bleeding 06/30/2016   Impacted cerumen of right ear 03/26/2016   Syncope 03/22/2016   Benign essential HTN 03/22/2016   CAD (coronary artery disease) 03/22/2016   CKD (chronic kidney disease) stage 3, GFR 30-59 ml/min ([place]) 03/22/2016   Depression 03/22/2016   Exertional dyspnea 11/04/2015   Ascending aortic aneurysm ([place]) 11/04/2015   Discoid lupus 10/10/2015   Arthralgia 10/10/2015   Pulmonary hypertension ([place]) 09/30/2015   Mild persistent  asthma 09/30/2015   Irritable larynx 09/04/2015   Rotator cuff disorder 03/06/2015   OCD (obsessive compulsive disorder) 04/03/2013   IBS (irritable bowel syndrome) 07/03/2012   Dysphagia 04/13/2012   Denture irritation 12/20/2011   Irritable bladder 12/20/2011   Knee pain, bilateral 12/20/2011   Hearing loss 10/11/2011   Pacemaker-Medtronic 05/18/2011   DOE (dyspnea on exertion) 04/13/2011   Gout, unspecified 04/30/2010   Anal fissure 02/03/2010   Atrial fibrillation ([place]) 08/26/2009   Sinoatrial node dysfunction ([place]) 09/17/2008   HYPOTENSION, ORTHOSTATIC 01/29/2008    Microscopic hematuria 08/21/2007   Pain in joint, pelvic region and thigh 08/01/2007   Vitamin D deficiency 05/23/2007   Anxiety disorder 05/23/2007   Hypothyroidism 03/22/2007   GERD 03/22/2007   Osteoarthritis 03/22/2007   CFS (chronic fatigue syndrome) 11/17/2006   Hypertensive heart disease 11/17/2006   ALLERGIC RHINITIS 11/17/2006   OSTEOPOROSIS 11/17/2006   BREAST CANCER, HX OF 11/17/2006    Past Surgical History:  Procedure Laterality Date   BREAST BIOPSY Left 1995   BREAST LUMPECTOMY Left 1995   axillary node dissectoin   CARDIAC CATHETERIZATION N/A 02/13/2016   Procedure: Right Heart Cath;  Surgeon: Larey Dresser, MD;  Location: [facility];  Service: Cardiovascular;  Laterality: N/A;   CATARACT EXTRACTION W/ INTRAOCULAR LENS  IMPLANT, BILATERAL Bilateral 06/2009 - 12.2915   left - right   DORSAL COMPARTMENT RELEASE  2000   left   HAMMER TOE SURGERY Left 2004   HEMORRHOID BANDING     INSERT / REPLACE / REMOVE PACEMAKER  2009   KNEE ARTHROSCOPY Left 08/29/2012   Procedure: LEFT KNEE ARTHROSCOPY ;  Surgeon: Hessie Dibble, MD;  Location: [facility];  Service: Orthopedics;  Laterality: Left;   MASTECTOMY Bilateral 09/2000   nbx   PPM GENERATOR CHANGEOUT N/A 07/16/2019   Procedure: PPM GENERATOR CHANGEOUT;  Surgeon: Deboraha Sprang, MD;  Location: [facility];  Service: Cardiovascular;  Laterality: N/A;   TRIGGER FINGER RELEASE Right 07/31/2013   Procedure: EXCISE MASS RIGHT INDEX A-1 PLLLEY RELEASE A-1 RIGHT INDEX;  Surgeon: Cammie Sickle., MD;  Location: [place];  Service: Orthopedics;  Laterality: Right;   VAGINAL HYSTERECTOMY      Prior to Admission medications   Medication Sig Start Date End Date Taking? Authorizing Provider  cephALEXin (KEFLEX) 500 MG capsule Take 1 capsule (500 mg total) by mouth 2 (two) times daily for 7 days. 11/24/20 12/01/20 Yes Triplett, Cari B, FNP  clidinium-chlordiazePOXIDE (LIBRAX) 5-2.5 MG  capsule Take by mouth. 11/07/20  Yes [provider]  divalproex (DEPAKOTE SPRINKLE) 125 MG capsule Take 125 mg by mouth. 1 cap in am and 2 caps in pm   Yes [provider]  ELIQUIS 2.5 MG TABS tablet Take 2.5 mg by mouth 2 (two) times daily. 10/03/20  Yes [provider]  FLUoxetine (PROZAC) 10 MG tablet Take 20 mg by mouth daily. 10/17/20  Yes [provider]  levothyroxine (SYNTHROID) 50 MCG tablet Take 50 mcg by mouth daily. 09/24/20  Yes [provider]  melatonin 5 MG TABS Take 5 mg by mouth at bedtime.   Yes [provider]  pantoprazole (PROTONIX) 40 MG tablet Take 1 tablet (40 mg total) by mouth daily. 07/31/20  Yes Vonda Antigua B, MD  polyethylene glycol (MIRALAX MIX-IN PAX) 17 g packet Take 17 g by mouth daily. 10/14/20 01/12/21 Yes Vonda Antigua B, MD  risperiDONE (RISPERDAL) 0.5 MG tablet Take by mouth.  11/06/20  Yes [provider]  ALPRAZolam Duanne Moron) 0.5 MG tablet Take 0.5 mg by mouth 2 (two) times daily as needed for anxiety.    [provider]  hydrOXYzine (VISTARIL) 25 MG capsule Take 25 mg by mouth 3 (three) times daily as needed for anxiety or agitation. Patient not taking: Reported on 11/26/2020 09/25/20   [provider]  traZODone (DESYREL) 50 MG tablet Take 25 mg by mouth at bedtime as needed. 09/23/20   [provider]    Allergies Lactase, Amlodipine besy-benazepril hcl, Amlodipine besylate, Clonidine hydrochloride, Lactose intolerance (gi), Valsartan, and Verapamil  Family History  Problem Relation Age of Onset   Ovarian cancer Mother    Heart disease Father    Stroke Sister    Stroke Brother    Cancer Brother    Heart disease Brother     Social History Social History   Tobacco Use   Smoking status: Never   Smokeless tobacco: Never  Vaping Use   Vaping Use: Never used  Substance Use Topics   Alcohol use: No    Alcohol/week: 0.0 standard drinks   Drug use: No     Review of Systems Unable to obtain secondary to altered mental status  ____________________________________________   PHYSICAL EXAM:  VITAL SIGNS: ED Triage Vitals  Enc Vitals Group     BP      Pulse      Resp      Temp      Temp src      SpO2      Weight      Height      Head Circumference      Peak Flow      Pain Score      Pain Loc      Pain Edu?      Excl. in [place]?     Constitutional: Somnolent but arousable to voice. Eyes: Conjunctivae are normal. Head: Atraumatic. Nose: No congestion/rhinnorhea. Mouth/Throat: Mucous membranes are dry. Neck: Normal ROM Cardiovascular: Normal rate, regular rhythm. Grossly normal heart sounds. Respiratory: Normal respiratory effort.  No retractions. Lungs CTAB. Gastrointestinal: Soft and nontender. No distention. Genitourinary: deferred Musculoskeletal: No lower extremity tenderness nor edema. Neurologic: Incoherent and mumbling speech pattern. No gross focal neurologic deficits are appreciated, withdraws from pain in all 4 extremities with occasional purposeful movements. Skin:  Skin is warm, dry and intact. No rash noted. Psychiatric: Unable to assess.  ____________________________________________   LABS (all labs ordered are listed, but only abnormal results are displayed)  Labs Reviewed  CBC WITH DIFFERENTIAL/PLATELET - Abnormal; Notable for the following components:      Result Value   RBC 3.25 (*)    Hemoglobin 11.2 (*)    HCT 33.3 (*)    MCV 102.5 (*)    MCH 34.5 (*)    Platelets 119 (*)    Lymphs Abs 0.5 (*)    Abs Immature Granulocytes 0.12 (*)    All other components within normal limits  COMPREHENSIVE METABOLIC PANEL - Abnormal; Notable for the following components:   BUN 41 (*)    Creatinine, Ser 1.15 (*)    Total Protein 6.2 (*)    Albumin 3.0 (*)    GFR, Estimated 46 (*)    All other components within normal limits  URINALYSIS, COMPLETE (UACMP) WITH MICROSCOPIC - Abnormal; Notable for the  following components:   Color, Urine YELLOW (*)    APPearance HAZY (*)    Hgb urine dipstick LARGE (*)  Ketones, ur 20 (*)    Protein, ur 30 (*)    Leukocytes,Ua TRACE (*)    RBC / HPF >50 (*)    Bacteria, UA RARE (*)    All other components within normal limits  URINE CULTURE  CULTURE, BLOOD (ROUTINE X 2)  CULTURE, BLOOD (ROUTINE X 2)  RESP PANEL BY RT-PCR (FLU A&B, COVID) ARPGX2  SARS CORONAVIRUS 2 (TAT 6-24 HRS)  LIPASE, BLOOD  LACTIC ACID, PLASMA  TROPONIN I (HIGH SENSITIVITY)  TROPONIN I (HIGH SENSITIVITY)   ____________________________________________  EKG  ED ECG REPORT I, Blake Divine, the attending physician, personally viewed and interpreted this ECG.   Date: 11/26/2020  EKG Time: 9:57  Rate: 59  Rhythm: normal sinus rhythm  Axis: LAD  Intervals:left anterior fascicular block  ST&T Change: Nonspecific inferolateral T wave changes   PROCEDURES  Procedure(s) performed (including Critical Care):  Procedures   ____________________________________________   INITIAL IMPRESSION / ASSESSMENT AND PLAN / ED COURSE      87 year old female with past medical history of hypertension, CAD, CKD, atrial fibrillation on Eliquis, and DVT/PE who presents to the ED for worsening generalized weakness and altered mental status following multiple recent falls and diagnosis of UTI.  Patient currently somnolent but arousable to voice, withdraws from pain in all 4 extremities.  I had evaluated her 2 days ago in the ED when she was diagnosed with UTI, she does appear significantly weaker since then and is no longer able to communicate appropriately.  I would be suspicious for worsening UTI given patient has had a hard time taking antibiotics at her nursing facility.  We will hydrate with IV fluids, recheck UA along with labs.  We will also check CT head but lower suspicion for intracranial etiology given no focal neurologic deficits on exam.  CT head is negative for acute  process, labs are reassuring but UA does still appear to be infected.  Unfortunately there is no culture data available from prior visit, but we will broaden antibiotics to cefepime.  Case discussed with hospitalist for admission.      ____________________________________________   FINAL CLINICAL IMPRESSION(S) / ED DIAGNOSES  Final diagnoses:  Altered mental status, unspecified altered mental status type  Acute cystitis without hematuria     ED Discharge Orders     None        Note:  This document was prepared using Dragon voice recognition software and may include unintentional dictation errors.    Blake Divine, MD 11/26/20 1146

## 2020-11-26 NOTE — Progress Notes (Signed)
Patient alert but confused, believes she is at home. Bed alarm in use.  Oriented patient to room and call bell.

## 2020-11-27 DIAGNOSIS — F320 Major depressive disorder, single episode, mild: Secondary | ICD-10-CM

## 2020-11-27 DIAGNOSIS — N390 Urinary tract infection, site not specified: Principal | ICD-10-CM

## 2020-11-27 LAB — GLUCOSE, CAPILLARY
Glucose-Capillary: 52 mg/dL — ABNORMAL LOW (ref 70–99)
Glucose-Capillary: 141 mg/dL — ABNORMAL HIGH (ref 70–99)

## 2020-11-27 MED ORDER — ADULT MULTIVITAMIN W/MINERALS CH
1.0000 | ORAL_TABLET | Freq: Every day | ORAL | Status: DC
  Administered 2020-11-28 – 2020-12-01 (×4): 1 via ORAL
  Filled 2020-11-27 (×4): qty 1

## 2020-11-27 MED ORDER — BOOST / RESOURCE BREEZE PO LIQD CUSTOM
1.0000 | Freq: Three times a day (TID) | ORAL | Status: DC
  Administered 2020-11-27 – 2020-12-01 (×10): 1 via ORAL

## 2020-11-27 MED ORDER — DEXTROSE 50 % IV SOLN
50.0000 mL | Freq: Once | INTRAVENOUS | Status: AC
  Administered 2020-11-27: 50 mL via INTRAVENOUS

## 2020-11-27 MED ORDER — DEXTROSE 50 % IV SOLN
INTRAVENOUS | Status: AC
  Filled 2020-11-27: qty 50

## 2020-11-27 MED ORDER — ASCORBIC ACID 500 MG PO TABS
250.0000 mg | ORAL_TABLET | Freq: Two times a day (BID) | ORAL | Status: DC
  Administered 2020-11-27 – 2020-12-01 (×8): 250 mg via ORAL
  Filled 2020-11-27 (×8): qty 1

## 2020-11-27 NOTE — Progress Notes (Signed)
PROGRESS NOTE    [name]  [id number] DOB: Sep 15, 1929 DOA: 11/26/2020 PCP: Cassandria Anger, MD    Brief Narrative:  Altered mental status Patient presents to the ER for evaluation of confusion At baseline she is usually able oriented to person place and time Patient has a UTI which may account for her mental status change We will place patient empirically on Rocephin 1 g IV daily Expect improvement in her mental status following resolution of her underlying urinary tract infection Will request psychiatry evaluation if mental status does not improve despite adequate treatment of UTI since patient has a history of anxiety and depression and was recently admitted to the psych unit for stabilization of her underlying depression and anxiety.    ED Course: Patient is an 87 year old African-American female who was sent to the ER from [place] for evaluation of weakness and failure to thrive. Per nursing home staff patient was diagnosed with a UTI 2 days prior and over the last couple of days she has refused all oral intake and is noted to have become increasingly confused. She had been seen twice in the ER for evaluation of frequent falls.      Consultants:    Procedures:   Antimicrobials:      Subjective: Pt is quiet,does not interact with exam or questions. Just mumbles in low tone  Objective: Vitals:   11/26/20 1543 11/26/20 2014 11/27/20 0504 11/27/20 0737  BP: 140/73 133/77 121/60 120/62  Pulse: (!) 53 65 (!) 51 (!) 42  Resp: '18 12 18 16  '$ Temp: 97.6 F (36.4 C) 97.8 F (36.6 C) 98.3 F (36.8 C) 99.1 F (37.3 C)  TempSrc: Oral Oral Oral   SpO2: 100% 100% 100% 100%  Weight:      Height:        Intake/Output Summary (Last 24 hours) at 11/27/2020 1425 Last data filed at 11/27/2020 1000 Gross per 24 hour  Intake 303.69 ml  Output 700 ml  Net -396.31 ml   Filed Weights   11/26/20 0953  Weight: 48.2 kg    Examination:  General exam: Appears calm  and comfortable  Respiratory system: Clear to auscultation. Respiratory effort normal. Cardiovascular system: S1 & S2 heard, RRR. No JVD, murmurs, rubs, gallops or clicks.  Gastrointestinal system: Abdomen is nondistended, soft and nontender. . Normal bowel sounds heard. Central nervous system: unable to assess Extremities: No edema    Data Reviewed: I have personally reviewed following labs and imaging studies  CBC: Recent Labs  Lab 11/23/20 0725 11/24/20 1022 11/26/20 1015  WBC 5.3 3.9* 4.4  NEUTROABS 4.0 2.8 3.3  HGB 11.6* 10.9* 11.2*  HCT 35.1* 32.6* 33.3*  MCV 105.4* 103.2* 102.5*  PLT 93* 98* [id number]*   Basic Metabolic Panel: Recent Labs  Lab 11/23/20 0725 11/24/20 1022 11/26/20 1015  NA 142 142 145  K 4.6 3.3* 3.6  CL 103 104 106  CO2 '23 26 25  '$ GLUCOSE 50* 62* 86  BUN 31* 34* 41*  CREATININE 1.17* 1.03* 1.15*  CALCIUM 8.6* 8.5* 9.2   GFR: Estimated Creatinine Clearance: 25.7 mL/min (A) (by C-G formula based on SCr of 1.15 mg/dL (H)). Liver Function Tests: Recent Labs  Lab 11/24/20 1022 11/26/20 1015  AST 23 25  ALT 13 17  ALKPHOS 40 42  BILITOT 1.1 1.2  PROT 6.2* 6.2*  ALBUMIN 3.2* 3.0*   Recent Labs  Lab 11/26/20 1015  LIPASE 21   No results for input(s): AMMONIA in the last 168 hours.  Coagulation Profile: No results for input(s): INR, PROTIME in the last 168 hours. Cardiac Enzymes: Recent Labs  Lab 11/26/20 1444  CKTOTAL 397*   BNP (last 3 results) No results for input(s): PROBNP in the last 8760 hours. HbA1C: No results for input(s): HGBA1C in the last 72 hours. CBG: Recent Labs  Lab 11/27/20 1124 11/27/20 1159  GLUCAP 52* 141*   Lipid Profile: No results for input(s): CHOL, HDL, LDLCALC, TRIG, CHOLHDL, LDLDIRECT in the last 72 hours. Thyroid Function Tests: No results for input(s): TSH, T4TOTAL, FREET4, T3FREE, THYROIDAB in the last 72 hours. Anemia Panel: No results for input(s): VITAMINB12, FOLATE, FERRITIN, TIBC, IRON,  RETICCTPCT in the last 72 hours. Sepsis Labs: Recent Labs  Lab 11/26/20 1015  LATICACIDVEN 0.9    Recent Results (from the past 240 hour(s))  Culture, blood (routine x 2)     Status: None (Preliminary result)   Collection Time: 11/26/20 10:15 AM   Specimen: BLOOD  Result Value Ref Range Status   Specimen Description BLOOD  LEFT FA  Final   Special Requests   Final    BOTTLES DRAWN AEROBIC AND ANAEROBIC Blood Culture adequate volume   Culture   Final    NO GROWTH < 24 HOURS Performed at [facility], [address], [place], [place] [address]    Report Status PENDING  Incomplete  Culture, blood (routine x 2)     Status: None (Preliminary result)   Collection Time: 11/26/20 10:15 AM   Specimen: BLOOD  Result Value Ref Range Status   Specimen Description BLOOD  RT FA  Final   Special Requests   Final    BOTTLES DRAWN AEROBIC AND ANAEROBIC Blood Culture results may not be optimal due to an excessive volume of blood received in culture bottles   Culture   Final    NO GROWTH < 24 HOURS Performed at [facility], [address] [place], [place] [address]    Report Status PENDING  Incomplete  Resp Panel by RT-PCR (Flu A&B, Covid) Nasopharyngeal Swab     Status: None   Collection Time: 11/26/20 11:27 AM   Specimen: Nasopharyngeal Swab; Nasopharyngeal(NP) swabs in vial transport medium  Result Value Ref Range Status   SARS Coronavirus 2 by RT PCR NEGATIVE NEGATIVE Final    Comment: (NOTE) SARS-CoV-2 target nucleic acids are NOT DETECTED.  The SARS-CoV-2 RNA is generally detectable in upper respiratory specimens during the acute phase of infection. The lowest concentration of SARS-CoV-2 viral copies this assay can detect is 138 copies/mL. A negative result does not preclude SARS-Cov-2 infection and should not be used as the sole basis for treatment or other patient management decisions. A negative result may occur with  improper specimen collection/handling,  submission of specimen other than nasopharyngeal swab, presence of viral mutation(s) within the areas targeted by this assay, and inadequate number of viral copies(<138 copies/mL). A negative result must be combined with clinical observations, patient history, and epidemiological information. The expected result is Negative.  Fact Sheet for Patients:  EntrepreneurPulse.com.au  Fact Sheet for Healthcare Providers:  IncredibleEmployment.be  This test is no t yet approved or cleared by the [place] FDA and  has been authorized for detection and/or diagnosis of SARS-CoV-2 by FDA under an Emergency Use Authorization (EUA). This EUA will remain  in effect (meaning this test can be used) for the duration of the COVID-19 declaration under Section 564(b)(1) of the Act, 21 U.S.C.section 360bbb-3(b)(1), unless the authorization is terminated  or revoked  sooner.       Influenza A by PCR NEGATIVE NEGATIVE Final   Influenza B by PCR NEGATIVE NEGATIVE Final    Comment: (NOTE) The Xpert Xpress SARS-CoV-2/FLU/RSV plus assay is intended as an aid in the diagnosis of influenza from Nasopharyngeal swab specimens and should not be used as a sole basis for treatment. Nasal washings and aspirates are unacceptable for Xpert Xpress SARS-CoV-2/FLU/RSV testing.  Fact Sheet for Patients: EntrepreneurPulse.com.au  Fact Sheet for Healthcare Providers: IncredibleEmployment.be  This test is not yet approved or cleared by the [place] FDA and has been authorized for detection and/or diagnosis of SARS-CoV-2 by FDA under an Emergency Use Authorization (EUA). This EUA will remain in effect (meaning this test can be used) for the duration of the COVID-19 declaration under Section 564(b)(1) of the Act, 21 U.S.C. section 360bbb-3(b)(1), unless the authorization is terminated or revoked.  Performed at [facility], [address] [place], [place] [address]          Radiology Studies: CT Head Wo Contrast  Result Date: 11/26/2020 CLINICAL DATA:  Mental status change with unknown cause. Recently diagnosed UTI EXAM: CT HEAD WITHOUT CONTRAST TECHNIQUE: Contiguous axial images were obtained from the base of the skull through the vertex without intravenous contrast. COMPARISON:  11/23/2020 FINDINGS: Brain: No evidence of acute infarction, hemorrhage, hydrocephalus, extra-axial collection or mass lesion/mass effect. Moderate generalized cortical atrophy. Age normal white matter appearance Vascular: No hyperdense vessel or unexpected calcification. Skull: Normal. Negative for fracture or focal lesion. Sinuses/Orbits: No acute finding. IMPRESSION: No acute or interval finding. Generalized atrophy. Electronically Signed   By: Monte Fantasia M.D.   On: 11/26/2020 11:08        Scheduled Meds:  apixaban  2.5 mg Oral BID   dextrose       divalproex  125 mg Oral BID   FLUoxetine  20 mg Oral Daily   levothyroxine  50 mcg Oral Daily   mouth rinse  15 mL Mouth Rinse BID   melatonin  5 mg Oral QHS   pantoprazole  40 mg Oral Daily   polyethylene glycol  17 g Oral Daily   Continuous Infusions:  cefTRIAXone (ROCEPHIN)  IV      Assessment & Plan:   Principal Problem:   AMS (altered mental status) Active Problems:   Hypothyroidism   Anxiety disorder   Atrial fibrillation (HCC)   Benign essential HTN   CKD (chronic kidney disease) stage 3, GFR 30-59 ml/min (HCC)   Depression   Acute lower UTI   Altered mental status Patient presents to the ER for evaluation of confusion At baseline she is usually able oriented to person place and time Patient has a UTI which may account for her mental status change We will place patient empirically on Rocephin 1 g IV daily 8/4 continue IV antibiotics follow-up on any cultures expect      History of atrial fibrillation Continue Eliquis.  If has frequent falls may need  to discuss risk versus benefit prior to discharge about anticoagulation       Urinary tract infection Follow-up cultures continue IV antibiotics     Hypothyroidism Continue Synthroid       Anxiety and depression Continue Xanax as needed, Depakote, trazodone and fluoxetine       Frequent falls Place patient on fall precautions PT evaluation once mental status improves     DVT prophylaxis: Eliquis Code Status: DNR Family Communication: None at bedside Disposition Plan:  Status is: Inpatient  Remains inpatient appropriate because:Inpatient level of care appropriate due to severity of illness  Dispo: The patient is from: Home              Anticipated d/c is to: Home              Patient currently is not medically stable to d/c.   Difficult to place patient No            LOS: 1 day   Time spent: 35 minutes with more than 50% on Brinckerhoff, MD [facility] Pager [phone]  If 7PM-7AM, please contact night-coverage 11/27/2020, 2:25 PM

## 2020-11-27 NOTE — Progress Notes (Signed)
Blood glucose checked and noted @ 52. One amp of D50 was given and will recheck. Dr Kurtis Bushman notified as well.

## 2020-11-27 NOTE — Progress Notes (Signed)
Initial Nutrition Assessment  DOCUMENTATION CODES:   Severe malnutrition in context of chronic illness  INTERVENTION:   Boost Breeze po TID, each supplement provides 250 kcal and 9 grams of protein  MVI po daily   Vitamin C 223m po BID  Dysphagia 3 diet   Pt at high refeed risk; recommend monitor potassium, magnesium and phosphorus labs daily until stable  NUTRITION DIAGNOSIS:   Severe Malnutrition related to chronic illness (breast cncer, advanced age) as evidenced by moderate fat depletion, severe fat depletion, severe muscle depletion.  GOAL:   Patient will meet greater than or equal to 90% of their needs  MONITOR:   PO intake, Supplement acceptance, Labs, Skin, I & O's, Weight trends  REASON FOR ASSESSMENT:   Malnutrition Screening Tool    ASSESSMENT:   87y.o. female with medical history significant for hypertension, chronic kidney disease, Afib, CHF, breast cancert, depression and anxiety who presents with UTI and AMS.  Met with pt in room today. Pt is a poor historian and is unable to provide much nutrition related history. Pt reports that she is not hungry today. Pt reports that she did eat a few bites of applesauce for lunch today but she reports that she has IBS and is unable to tolerate this. Pt is only documented to be eating sips and bites of meals. Pt also reports that she cannot eat pork or dairy as she is lactose intolerant. Pt drinks almond milk at home but is willing to try lactaid. RD discussed with pt the importance of adequate nutrition needed to preserve lean muscle. Pt refuses to try Ensure even after education that it is lactose free. RD will add supplements and MVI to help pt meet her estimated needs. Of note, pt wear dentures. Pt is likely at refeed risk. Per chart, pt is down 35lbs(25%) over the past year; this is severe weight loss.   Medications reviewed and include: synthroid, melatonin, protonix, miralax, ceftriaxone  Labs reviewed: K 3.6  wnl, BUN 41(H), creat 1.15(H)  NUTRITION - FOCUSED PHYSICAL EXAM:  Flowsheet Row Most Recent Value  Orbital Region Moderate depletion  Upper Arm Region Severe depletion  Thoracic and Lumbar Region Severe depletion  Buccal Region Moderate depletion  Temple Region Moderate depletion  Clavicle Bone Region Severe depletion  Clavicle and Acromion Bone Region Severe depletion  Scapular Bone Region Severe depletion  Dorsal Hand Severe depletion  Patellar Region Severe depletion  Anterior Thigh Region Severe depletion  Posterior Calf Region Severe depletion  Edema (RD Assessment) None  Hair Reviewed  Eyes Reviewed  Mouth Reviewed  Skin Reviewed  Nails Reviewed   Diet Order:   Diet Order             Diet regular Room service appropriate? Yes; Fluid consistency: Thin  Diet effective now                  EDUCATION NEEDS:   Not appropriate for education at this time  Skin:  Skin Assessment: Reviewed RN Assessment (Stage II sacrum)  Last BM:  pta  Height:   Ht Readings from Last 1 Encounters:  11/26/20 5' 4" (1.626 m)    Weight:   Wt Readings from Last 1 Encounters:  11/26/20 48.2 kg    Ideal Body Weight:  54.5 kg  BMI:  Body mass index is 18.24 kg/m.  Estimated Nutritional Needs:   Kcal:  1300-1500kcal/day  Protein:  65-75g/day  Fluid:  1.2-1.4L/day  CKoleen DistanceMS, RD, LDN Please refer  to [facility] for RD and/or RD on-call/weekend/after hours pager

## 2020-11-27 NOTE — Care Management Important Message (Signed)
Important Message  Patient Details  Name: [name] MRN: [id number] Date of Birth: 1930/04/21   Medicare Important Message Given:  N/A - LOS <3 / Initial given by admissions  Initial Medicare IM reviewed with [name], daughter at [phone] by Thornton Dales, Patient Access Associate on 11/27/2020 at 10:22am.   Dannette Barbara 11/27/2020, 6:05 PM

## 2020-11-28 DIAGNOSIS — E43 Unspecified severe protein-calorie malnutrition: Secondary | ICD-10-CM | POA: Insufficient documentation

## 2020-11-28 DIAGNOSIS — L8990 Pressure ulcer of unspecified site, unspecified stage: Secondary | ICD-10-CM | POA: Insufficient documentation

## 2020-11-28 LAB — URINE CULTURE: Culture: <10000 — AB

## 2020-11-28 NOTE — Evaluation (Signed)
Occupational Therapy Evaluation Patient Details Name: [name] MRN: [id number] DOB: Aug 07, 1929 Today's Date: 11/28/2020    History of Present Illness 87 y.o. female with medical history significant for hypertension, chronic kidney disease, CHF, depression and anxiety who was sent to the ER from [place] for evaluation of weakness and failure to thrive. Per nursing home staff patient was diagnosed with a UTI 2 days prior and over the last couple of days she has refused all oral intake and is noted to have become increasingly confused. Recent ER admissions for falls.   Clinical Impression   Pt was seen for OT evaluation this date. Prior to hospital admission, pt was from an ALF and per previous chart review and pt report, she was ambulating with a RW vs cane and mod indep with ADL. Pt received in bed leaning hard to R side with head against the bed rail and lunch tray in front of her. Appears that she attempted to eat but unclear if successful, lots of spills noted. Pt reports she has been unable to sit back up on her own. Alert and oriented to self and month. She also knew she was not at home but unable to verbalize hospital. Pt required MAX A for bed mobility to sit EOB and unable to maintain static sitting balance, requiring MAX A (intermittently MOD A) for balance, heavy R lateral lean. Pt made no attempts to self correct. Also noted to keep head rotated to the R side and when asked to turn to the L, she moves her eyes but not her head. With assist, unable to achieve midline without pain in her neck/jaw/teeth, per pt report. Pt reports this impaired cervical rotation is new as of this admission. Pt instructed in dynamic reaching seated EOB with limited improvement noted with static sitting balance afterwards. Pt unsafe to attempt standing with therapist 2/2 significant impairments in balance and began to fatigue after >60mn seated EOB. Once returned to bed, RN assist for MAX A log roll for bed  level brief/linens change and pericare requiring MAX A. Pt globally weak, however, LUE weaker than RUE. Denies sensory deficits, denies acute visual changes, or coordination deficits. Impaired coordination noted bilaterally. Currently pt demonstrates significant impairments in strength, balance, coordination, activity tolerance, and cognition as described below (See OT problem list) which functionally limit her ability to perform ADL/self-care tasks and mobility. Pt would benefit from skilled OT services to address noted impairments and functional limitations (see below for any additional details) in order to maximize safety and independence while minimizing falls risk and caregiver burden. Upon hospital discharge, recommend STR to maximize pt safety and return to PLOF.     Follow Up Recommendations  SNF    Equipment Recommendations  3 in 1 bedside commode;Other (comment) (defer to next venue')    Recommendations for Other Services       Precautions / Restrictions Precautions Precautions: Fall Restrictions Weight Bearing Restrictions: No      Mobility Bed Mobility Overal bed mobility: Needs Assistance Bed Mobility: Supine to Sit;Sit to Supine;Rolling Rolling: Mod assist;Max assist   Supine to sit: Max assist;HOB elevated Sit to supine: Total assist   General bed mobility comments: Pt required significant assist to perform for trunk elevation and BLE mgt; MOD-MAX A for rolling side to side during bed level brief/linens change and pericare with +2 for assist    Transfers                 General transfer comment: unsafe to  attempt    Balance Overall balance assessment: Needs assistance   Sitting balance-Leahy Scale: Zero Sitting balance - Comments: Pt required MAX A for static sitting balance, intermittent MIN/MOD A to maintain, heavy R lateral lean, does not attempt to self correct Postural control: Right lateral lean                                 ADL  either performed or assessed with clinical judgement   ADL Overall ADL's : Needs assistance/impaired Eating/Feeding: Set up;Minimal assistance;Supervision/ safety;Sitting;Bed level Eating/Feeding Details (indicate cue type and reason): pt noted with tray in front of her at start of session, appears that attempts were made to eat but unclear if she did. Blanket partially on tray, wet. Carton drink spilled. Pt endorses not eating a lot. Sitting EOB, initial MIN A to hold cup to bring to her mouth, improving to SBA to take a couple more sips while OT provided heavy assist for sitting balance. Grooming: Sitting;Minimal assistance   Upper Body Bathing: Bed level;Maximal assistance   Lower Body Bathing: Bed level;Maximal assistance   Upper Body Dressing : Sitting;Maximal assistance   Lower Body Dressing: Bed level;Maximal assistance     Toilet Transfer Details (indicate cue type and reason): unsafe to attempt                 Vision Patient Visual Report: No change from baseline Additional Comments: pt denies deficits other than "not seeing like I used to when I was younger", keeps head turned to R, difficulty to assist to bring head to midline     Perception     Praxis      Pertinent Vitals/Pain Pain Assessment: No/denies pain     Hand Dominance     Extremity/Trunk Assessment Upper Extremity Assessment Upper Extremity Assessment: Generalized weakness (LUE grossly 3/5, RUE grossly 3+ to 4-/5, denies sensory deficits, coordination impaired bilaterally)   Lower Extremity Assessment Lower Extremity Assessment: Generalized weakness;Defer to PT evaluation   Cervical / Trunk Assessment Cervical / Trunk Assessment: Other exceptions Cervical / Trunk Exceptions: significant lateral lean to R, unable to self correct; head rotated to the R, unable to bring to midline without resistence and pain. Pt denies difficulty with cervical rotation at baseline, happened "once I got here"    Communication     Cognition Arousal/Alertness: Awake/alert Behavior During Therapy: [facility] for tasks assessed/performed Overall Cognitive Status: No family/caregiver present to determine baseline cognitive functioning                                 General Comments: Pt alert and oriented to self, August, and knows she's not currently at home. Pt requires VC/TC for simple commands, also appears HOH   General Comments       Exercises Other Exercises Other Exercises: Pt instructed in dynamic reaching activity seated EOB to improve sitting balance, demo's difficulty with performing bilaterally Other Exercises: +2 for bed level toileting and linens change   Shoulder Instructions      Home Living Family/patient expects to be discharged to:: Assisted living                             Home Equipment: Walker - 2 wheels          Prior Functioning/Environment Level of Independence: Needs assistance  Gait / Transfers Assistance Needed: per chart, pt report, she ambulates with a RW vs SPC (no caregivers present to verify) ADL's / Homemaking Assistance Needed: Pt reports she was able to do most ADL herself, per chart, was managing medication independently in Jan 2022            OT Problem List: Decreased strength;Decreased activity tolerance;Impaired balance (sitting and/or standing);Decreased knowledge of use of DME or AE;Decreased safety awareness;Impaired UE functional use;Decreased range of motion;Decreased coordination      OT Treatment/Interventions: Self-care/ADL training;Therapeutic exercise;Therapeutic activities;Neuromuscular education;DME and/or AE instruction;Energy conservation;Patient/family education;Balance training;Manual therapy    OT Goals(Current goals can be found in the care plan section) Acute Rehab OT Goals Patient Stated Goal: go home OT Goal Formulation: With patient Time For Goal Achievement: 12/12/20 Potential to Achieve Goals:  Fair ADL Goals Pt Will Perform Grooming: with set-up;with min guard assist;sitting (CGA for sitting balance) Pt Will Transfer to Toilet: with mod assist;bedside commode;stand pivot transfer (LRAD PRN) Additional ADL Goal #1: Pt will demonstrate >50% cervical rotation active ROM for seated ADL tasks. Additional ADL Goal #2: Pt will perform supine > sit EOB with Min A for seated ADL  OT Frequency: Min 2X/week   Barriers to D/C:            Co-evaluation              AM-PAC OT "6 Clicks" Daily Activity     Outcome Measure Help from another person eating meals?: A Little Help from another person taking care of personal grooming?: A Little Help from another person toileting, which includes using toliet, bedpan, or urinal?: Total Help from another person bathing (including washing, rinsing, drying)?: A Lot Help from another person to put on and taking off regular upper body clothing?: A Lot Help from another person to put on and taking off regular lower body clothing?: A Lot 6 Click Score: 13   End of Session Nurse Communication: Mobility status  Activity Tolerance: Patient tolerated treatment well Patient left: in bed;with call bell/phone within reach;with bed alarm set  OT Visit Diagnosis: Other abnormalities of gait and mobility (R26.89);Muscle weakness (generalized) (M62.81)                Time: [id number] OT Time Calculation (min): 32 min Charges:  OT General Charges $OT Visit: 1 Visit OT Evaluation $OT Eval Moderate Complexity: 1 Mod OT Treatments $Self Care/Home Management : 8-22 mins $Therapeutic Activity: 8-22 mins  Hanley Hays, MPH, MS, OTR/L ascom [phone] 11/28/20, 3:16 PM

## 2020-11-28 NOTE — Evaluation (Signed)
Physical Therapy Evaluation Patient Details Name: [name] MRN: [id number] DOB: October 20, 1929 Today's Date: 11/28/2020   History of Present Illness  87 y.o. female with medical history significant for hypertension, chronic kidney disease, CHF, depression and anxiety who was sent to the ER from [place] for evaluation of weakness and failure to thrive. Per nursing home staff patient was diagnosed with a UTI 2 days prior and over the last couple of days she has refused all oral intake and is noted to have become increasingly confused. Recent ER admissions for falls.   Clinical Impression  Pt is a 87 year old F admitted to hospital on 11/26/20 for AMS. Unknown PLOF due to pt confusion; per chart review, pt resides at ALF and uses RW vs. SPC for mobility. Pt presents with generalized weakness (LUE>RUE), impaired LE sensation, increased pain levels, decreased activity tolerance, decreased gross balance, impaired AROM of c-spine, impaired righting reaction for balance, poor safety awareness, and impaired coordination. Due to deficits, pt required max-total assist for bed mobility and total assist for transfers with RW. Was grossly "zero" for seated/standing balance, however, pt able to demonstrate "fair" seated balance momentarily with R UE support on bed after STS transfer. Pt also initially resting in R cervical rotation, unable to achieve cervical neutral, however, observed to be in cervical neutral after 1st STS transfer. Unknown if cervical resting position is cognitive related. Deficits limit the pt's ability to safely and independently perform ADL's, transfer, and ambulate. Pt will benefit from acute skilled PT services to address deficits for return to baseline function. At this time, PT recommends SNF at [place] with 24/7 care.     Follow Up Recommendations SNF;Supervision/Assistance - 24 hour    Equipment Recommendations  Other (comment) (defer to next level of care)       Precautions /  Restrictions Precautions Precautions: Fall Restrictions Weight Bearing Restrictions: No      Mobility  Bed Mobility Overal bed mobility: Needs Assistance Bed Mobility: Supine to Sit;Sit to Supine Rolling: Mod assist;Max assist   Supine to sit: Max assist;HOB elevated Sit to supine: Total assist   General bed mobility comments: Pt required significant assist to perform for trunk elevation and BLE mgt for supine<>sit transfers    Transfers Overall transfer level: Needs assistance Equipment used: Rolling walker (2 wheeled) Transfers: Sit to/from Stand Sit to Stand: Total assist         General transfer comment: total assist to stand x2 from EOB with RW. Required multimodal and hand-over-hand cues for hand placement/sequencing. Unable to achieve full upright standing, requiring tactile cues for hip and trunk extension. demonstrates wide BOS and R lateral lean.  Ambulation/Gait             General Gait Details: unsafe due to total assist with transfers     Balance Overall balance assessment: Needs assistance   Sitting balance-Leahy Scale: Zero Sitting balance - Comments: initially zero, then progressing to fair after 1st STS (lasted momentarily). Initially presented with posterior lean, unable to correct without assist. Then presented with R lateral lean, able to assist with correction. Postural control: Right lateral lean;Posterior lean Standing balance support: Bilateral upper extremity supported;During functional activity Standing balance-Leahy Scale: Zero Standing balance comment: total assist for standing in RW         Pertinent Vitals/Pain Pain Assessment: 0-10 Pain Score: 10-Worst pain ever Pain Location: HA/neck pain Pain Intervention(s): Limited activity within patient's tolerance;Monitored during session;Repositioned    Home Living Family/patient expects to be discharged  to:: Assisted living        Home Equipment: Walker - 2 wheels      Prior  Function Level of Independence: Needs assistance   Gait / Transfers Assistance Needed: per chart, pt report, she ambulates with a RW vs SPC (no caregivers present to verify)  ADL's / Homemaking Assistance Needed: Pt reports she was able to do most ADL herself, per chart, was managing medication independently in Jan 2022           Extremity/Trunk Assessment   Upper Extremity Assessment Upper Extremity Assessment: Defer to OT evaluation;Generalized weakness    Lower Extremity Assessment Lower Extremity Assessment: Generalized weakness (3-/5 hip flexion and knee flexion, 3+/5 knee ext, DF/PF 3+/5; alternating heel tap coordination intact; light touch intact but deep touch impaired on RLE (0/5 stating RLE as LLE without visual input))    Cervical / Trunk Assessment Cervical / Trunk Assessment: Other exceptions Cervical / Trunk Exceptions: significant lateral lean to R, unable to self correct; head rotated to the R, unable to bring to midline. However, noted to be midline after 1st STS transfer, which lasted shortly. Pt denies difficulty with cervical rotation at baseline, states it happened "once I got here" (per OT), but tells PT it's been like this because of how she sleeps  Communication      Cognition Arousal/Alertness: Awake/alert Behavior During Therapy: WFL for tasks assessed/performed Overall Cognitive Status: No family/caregiver present to determine baseline cognitive functioning          General Comments: A&O x2 (able to state name, month, and location), unable to state situation and notes year is "2023". Required multimodal cues for sequencing/safety, and increased time for processing of task.      General Comments      Exercises Other Exercises Other Exercises: bed mobility and transfers, requiring max-total assist for all mobility. Able to demonstrate cervical/spinal neutral after 1st STS then unable to maintain seated balance Ind with return to R cervical  rotation. Other Exercises: Pt educated regarding: PT role/POC, DC recommendations, safety with mobility, benefits of participation with therapy.   Assessment/Plan    PT Assessment Patient needs continued PT services  PT Problem List Decreased strength;Decreased range of motion;Decreased activity tolerance;Decreased balance;Decreased mobility;Decreased coordination;Decreased cognition;Decreased safety awareness;Impaired sensation;Pain       PT Treatment Interventions Functional mobility training;Therapeutic activities;Therapeutic exercise;DME instruction;Balance training;Gait training;Neuromuscular re-education    PT Goals (Current goals can be found in the Care Plan section)  Acute Rehab PT Goals Patient Stated Goal: go home PT Goal Formulation: With patient Time For Goal Achievement: 12/12/20 Potential to Achieve Goals: Fair    Frequency Min 2X/week    AM-PAC PT "6 Clicks" Mobility  Outcome Measure Help needed turning from your back to your side while in a flat bed without using bedrails?: A Lot Help needed moving from lying on your back to sitting on the side of a flat bed without using bedrails?: A Lot Help needed moving to and from a bed to a chair (including a wheelchair)?: Total Help needed standing up from a chair using your arms (e.g., wheelchair or bedside chair)?: Total Help needed to walk in hospital room?: Total Help needed climbing 3-5 steps with a railing? : Total 6 Click Score: 8    End of Session Equipment Utilized During Treatment: Gait belt Activity Tolerance: Patient tolerated treatment well;Patient limited by fatigue Patient left: in bed;with call bell/phone within reach;with bed alarm set (bed in chair position) Nurse Communication: Mobility status PT Visit  Diagnosis: Unsteadiness on feet (R26.81);Repeated falls (R29.6);Muscle weakness (generalized) (M62.81);Difficulty in walking, not elsewhere classified (R26.2);Pain Pain - Right/Left:  (HA/neck)     Time: [id number] PT Time Calculation (min) (ACUTE ONLY): 29 min   Charges:   PT Evaluation $PT Eval Moderate Complexity: 1 Mod PT Treatments $Therapeutic Activity: 8-22 mins        Herminio Commons, PT, DPT 3:51 PM,11/28/20

## 2020-11-28 NOTE — Progress Notes (Addendum)
PROGRESS NOTE    Khristen Garibaldo  [id number] DOB: Oct 22, 1929 DOA: 11/26/2020 PCP: Cassandria Anger, MD    Brief Narrative:  Altered mental status Patient presents to the ER for evaluation of confusion At baseline she is usually able oriented to person place and time Patient has a UTI which may account for her mental status change We will place patient empirically on Rocephin 1 g IV daily Expect improvement in her mental status following resolution of her underlying urinary tract infection Will request psychiatry evaluation if mental status does not improve despite adequate treatment of UTI since patient has a history of anxiety and depression and was recently admitted to the psych unit for stabilization of her underlying depression and anxiety.    ED Course: Patient is an 87 year old African-American female who was sent to the ER from [facility] for evaluation of weakness and failure to thrive. Per nursing home staff patient was diagnosed with a UTI 2 days prior and over the last couple of days she has refused all oral intake and is noted to have become increasingly confused. She had been seen twice in the ER for evaluation of frequent falls.   8/5- more awake and interactive this am. Denies sob, cp, abd pain   Consultants:    Procedures:   Antimicrobials:      Subjective: As above  Objective: Vitals:   11/27/20 1512 11/27/20 1921 11/28/20 0510 11/28/20 0731  BP: 126/77 130/77 126/67 121/63  Pulse: (!) 59 (!) 59 (!) 43 (!) 40  Resp: '18 14 16   '$ Temp: 98.1 F (36.7 C) 98.5 F (36.9 C) 98 F (36.7 C) 98 F (36.7 C)  TempSrc:  Oral Axillary   SpO2: 99% 100% 99% 100%  Weight:      Height:        Intake/Output Summary (Last 24 hours) at 11/28/2020 0805 Last data filed at 11/28/2020 0531 Gross per 24 hour  Intake 100 ml  Output 900 ml  Net -800 ml   Filed Weights   11/26/20 0953  Weight: 48.2 kg    Examination:  Nad, calm Cta no w/r Regular s1/s2 no  gallop Soft benign +bs Awake and oriented x3, grossly intact Mood and affect appropriate in current setting    Data Reviewed: I have personally reviewed following labs and imaging studies  CBC: Recent Labs  Lab 11/23/20 0725 11/24/20 1022 11/26/20 1015  WBC 5.3 3.9* 4.4  NEUTROABS 4.0 2.8 3.3  HGB 11.6* 10.9* 11.2*  HCT 35.1* 32.6* 33.3*  MCV 105.4* 103.2* 102.5*  PLT 93* 98* [id number]*   Basic Metabolic Panel: Recent Labs  Lab 11/23/20 0725 11/24/20 1022 11/26/20 1015  NA 142 142 145  K 4.6 3.3* 3.6  CL 103 104 106  CO2 '23 26 25  '$ GLUCOSE 50* 62* 86  BUN 31* 34* 41*  CREATININE 1.17* 1.03* 1.15*  CALCIUM 8.6* 8.5* 9.2   GFR: Estimated Creatinine Clearance: 25.7 mL/min (A) (by C-G formula based on SCr of 1.15 mg/dL (H)). Liver Function Tests: Recent Labs  Lab 11/24/20 1022 11/26/20 1015  AST 23 25  ALT 13 17  ALKPHOS 40 42  BILITOT 1.1 1.2  PROT 6.2* 6.2*  ALBUMIN 3.2* 3.0*   Recent Labs  Lab 11/26/20 1015  LIPASE 21   No results for input(s): AMMONIA in the last 168 hours. Coagulation Profile: No results for input(s): INR, PROTIME in the last 168 hours. Cardiac Enzymes: Recent Labs  Lab 11/26/20 1444  CKTOTAL 397*   BNP (  last 3 results) No results for input(s): PROBNP in the last 8760 hours. HbA1C: No results for input(s): HGBA1C in the last 72 hours. CBG: Recent Labs  Lab 11/27/20 1124 11/27/20 1159  GLUCAP 52* 141*   Lipid Profile: No results for input(s): CHOL, HDL, LDLCALC, TRIG, CHOLHDL, LDLDIRECT in the last 72 hours. Thyroid Function Tests: No results for input(s): TSH, T4TOTAL, FREET4, T3FREE, THYROIDAB in the last 72 hours. Anemia Panel: No results for input(s): VITAMINB12, FOLATE, FERRITIN, TIBC, IRON, RETICCTPCT in the last 72 hours. Sepsis Labs: Recent Labs  Lab 11/26/20 1015  LATICACIDVEN 0.9    Recent Results (from the past 240 hour(s))  Urine Culture     Status: Abnormal   Collection Time: 11/26/20 10:15 AM    Specimen: Urine, Catheterized  Result Value Ref Range Status   Specimen Description   Final    URINE, CATHETERIZED Performed at [facility], [address], [place], [place] [address]    Special Requests   Final    NONE Performed at [facility], [address] [place], [place] [address]    Culture (A)  Final    <10,000 COLONIES/mL INSIGNIFICANT GROWTH Performed at [facility], [place] [address] [place], [place] [address]    Report Status 11/28/2020 FINAL  Final  Culture, blood (routine x 2)     Status: None (Preliminary result)   Collection Time: 11/26/20 10:15 AM   Specimen: BLOOD  Result Value Ref Range Status   Specimen Description BLOOD  LEFT FA  Final   Special Requests   Final    BOTTLES DRAWN AEROBIC AND ANAEROBIC Blood Culture adequate volume   Culture   Final    NO GROWTH 2 DAYS Performed at [facility], [address] [place], [place] [address]    Report Status PENDING  Incomplete  Culture, blood (routine x 2)     Status: None (Preliminary result)   Collection Time: 11/26/20 10:15 AM   Specimen: BLOOD  Result Value Ref Range Status   Specimen Description BLOOD  RT FA  Final   Special Requests   Final    BOTTLES DRAWN AEROBIC AND ANAEROBIC Blood Culture results may not be optimal due to an excessive volume of blood received in culture bottles   Culture   Final    NO GROWTH 2 DAYS Performed at [facility], [address] [place],  [address]    Report Status PENDING  Incomplete  Resp Panel by RT-PCR (Flu A&B, Covid) Nasopharyngeal Swab     Status: None   Collection Time: 11/26/20 11:27 AM   Specimen: Nasopharyngeal Swab; Nasopharyngeal(NP) swabs in vial transport medium  Result Value Ref Range Status   SARS Coronavirus 2 by RT PCR NEGATIVE NEGATIVE Final    Comment: (NOTE) SARS-CoV-2 target nucleic acids are NOT DETECTED.  The SARS-CoV-2 RNA is generally detectable in upper respiratory specimens during  the acute phase of infection. The lowest concentration of SARS-CoV-2 viral copies this assay can detect is 138 copies/mL. A negative result does not preclude SARS-Cov-2 infection and should not be used as the sole basis for treatment or other patient management decisions. A negative result may occur with  improper specimen collection/handling, submission of specimen other than nasopharyngeal swab, presence of viral mutation(s) within the areas targeted by this assay, and inadequate number of viral copies(<138 copies/mL). A negative result must be combined with clinical observations, patient history, and epidemiological information. The expected result is Negative.  Fact Sheet for  Patients:  EntrepreneurPulse.com.au  Fact Sheet for Healthcare Providers:  IncredibleEmployment.be  This test is no t yet approved or cleared by the [place] FDA and  has been authorized for detection and/or diagnosis of SARS-CoV-2 by FDA under an Emergency Use Authorization (EUA). This EUA will remain  in effect (meaning this test can be used) for the duration of the COVID-19 declaration under Section 564(b)(1) of the Act, 21 U.S.C.section 360bbb-3(b)(1), unless the authorization is terminated  or revoked sooner.       Influenza A by PCR NEGATIVE NEGATIVE Final   Influenza B by PCR NEGATIVE NEGATIVE Final    Comment: (NOTE) The Xpert Xpress SARS-CoV-2/FLU/RSV plus assay is intended as an aid in the diagnosis of influenza from Nasopharyngeal swab specimens and should not be used as a sole basis for treatment. Nasal washings and aspirates are unacceptable for Xpert Xpress SARS-CoV-2/FLU/RSV testing.  Fact Sheet for Patients: EntrepreneurPulse.com.au  Fact Sheet for Healthcare Providers: IncredibleEmployment.be  This test is not yet approved or cleared by the [place] FDA and has been authorized for detection and/or  diagnosis of SARS-CoV-2 by FDA under an Emergency Use Authorization (EUA). This EUA will remain in effect (meaning this test can be used) for the duration of the COVID-19 declaration under Section 564(b)(1) of the Act, 21 U.S.C. section 360bbb-3(b)(1), unless the authorization is terminated or revoked.  Performed at [facility], [address], [place], [place] [address]          Radiology Studies: CT Head Wo Contrast  Result Date: 11/26/2020 CLINICAL DATA:  Mental status change with unknown cause. Recently diagnosed UTI EXAM: CT HEAD WITHOUT CONTRAST TECHNIQUE: Contiguous axial images were obtained from the base of the skull through the vertex without intravenous contrast. COMPARISON:  11/23/2020 FINDINGS: Brain: No evidence of acute infarction, hemorrhage, hydrocephalus, extra-axial collection or mass lesion/mass effect. Moderate generalized cortical atrophy. Age normal white matter appearance Vascular: No hyperdense vessel or unexpected calcification. Skull: Normal. Negative for fracture or focal lesion. Sinuses/Orbits: No acute finding. IMPRESSION: No acute or interval finding. Generalized atrophy. Electronically Signed   By: Monte Fantasia M.D.   On: 11/26/2020 11:08        Scheduled Meds:  apixaban  2.5 mg Oral BID   vitamin C  250 mg Oral BID   divalproex  125 mg Oral BID   feeding supplement  1 Container Oral TID BM   FLUoxetine  20 mg Oral Daily   levothyroxine  50 mcg Oral Daily   mouth rinse  15 mL Mouth Rinse BID   melatonin  5 mg Oral QHS   multivitamin with minerals  1 tablet Oral Daily   pantoprazole  40 mg Oral Daily   polyethylene glycol  17 g Oral Daily   Continuous Infusions:  cefTRIAXone (ROCEPHIN)  IV Stopped (11/27/20 2255)    Assessment & Plan:   Principal Problem:   AMS (altered mental status) Active Problems:   Hypothyroidism   Anxiety disorder   Atrial fibrillation (HCC)   Benign essential HTN   CKD (chronic kidney disease) stage  3, GFR 30-59 ml/min (HCC)   Depression   Acute lower UTI   Altered mental status Patient presents to the ER for evaluation of confusion At baseline she is usually able oriented to person place and time 8/5 likely due to UTI Urine culture less than 10,000 We will continue to treat empirically with Rocephin x3 days Follow-up blood cultures      History of atrial fibrillation Continue  Eliquis  If has frequent falls may need to discuss risk versus benefit prior to discharge about anticoagulation with daughter      Urinary tract infection Continue Rocephin     Hypothyroidism Continue Synthroid       Anxiety and depression Continue Xanax as needed, Depakote, trazodone and fluoxetine       Frequent falls Place patient on fall precautions PT evaluation once mental status improves   Severe malnutrition  related to chronic disease illness Nutrition following   Consult PT OT   DVT prophylaxis: Eliquis Code Status: DNR Family Communication: None at bedside Disposition Plan:  Status is: Inpatient  Remains inpatient appropriate because:Inpatient level of care appropriate due to severity of illness  Dispo: The patient is from: Home              Anticipated d/c is to: Home              Patient currently is not medically stable to d/c.   Difficult to place patient No            LOS: 2 days   Time spent: 35 minutes with more than 50% on [place], MD [facility] Pager [phone]  If 7PM-7AM, please contact night-coverage 11/28/2020, 8:05 AM

## 2020-11-28 NOTE — Clinical Social Work Note (Signed)
RE: [name] Date of Birth: Feb 11, 1930 Date: 11/28/2020   To Whom It May Concern:  Please be advised that the above-named patient will require a short-term nursing home stay - anticipated 30 days or less for rehabilitation and strengthening.  The plan is for return home.

## 2020-11-28 NOTE — Clinical Social Work Note (Addendum)
Patient is from [facility]. Sent page to MD through [facility] requesting PT and OT consults.  Dayton Scrape, CSW [phone]   1:10 pm: Left voicemail for Ardis Hughs, RN at [facility] to see if they would be able to accept patient back tomorrow.  Dayton Scrape, CSW [phone]  1:46 pm: Spoke with Alwyn Ren at [facility] who confirmed they can accept her back tomorrow. Will put contact information in [facility] handoff for the med tech, Renee, so discharge can be coordinated. PT and OT evaluations are pending. Alwyn Ren said she thinks patient was using a cane to get around at the facility and was not receiving home health services.  Dayton Scrape, [place]

## 2020-11-28 NOTE — NC FL2 (Signed)
[place] LEVEL OF CARE SCREENING TOOL     IDENTIFICATION  Patient Name: [name] Birthdate: 06-26-1929 Sex: female Admission Date (Current Location): 11/26/2020  [facility] and [place] Number:  Engineering geologist and Address:  [facility], [address], [place], [place] [address]      Provider Number: [id number]  Attending Physician Name and Address:  Nolberto Hanlon, MD  Relative Name and Phone Number:       Current Level of Care: Hospital Recommended Level of Care: [place] Prior Approval Number:    Date Approved/Denied:   PASRR Number: Manual review  Discharge Plan: SNF    Current Diagnoses: Patient Active Problem List   Diagnosis Date Noted   Protein-calorie malnutrition, severe 11/28/2020   Pressure injury of skin 11/28/2020   AMS (altered mental status) 11/26/2020   Acute lower UTI 11/26/2020   Moderate recurrent major depression ([place]) 05/09/2020   Generalized anxiety disorder 05/09/2020   Moderately severe recurrent major depression ([place]) 05/09/2020   Weight loss 10/09/2019   DVT (deep venous thrombosis) ([place]) 03/29/2018   PE (pulmonary thromboembolism) ([place]) 02/03/2018   Macrocytosis 12/21/2017   Wart 12/21/2017   Sleep apnea 07/20/2016   Sigmoid diverticulitis 07/01/2016   Acute diverticulitis    Rectal bleeding 06/30/2016   Impacted cerumen of right ear 03/26/2016   Syncope 03/22/2016   Benign essential HTN 03/22/2016   CAD (coronary artery disease) 03/22/2016   CKD (chronic kidney disease) stage 3, GFR 30-59 ml/min (HCC) 03/22/2016   Depression 03/22/2016   Exertional dyspnea 11/04/2015   Ascending aortic aneurysm ([place]) 11/04/2015   Discoid lupus 10/10/2015   Arthralgia 10/10/2015   Pulmonary hypertension ([place]) 09/30/2015   Mild persistent asthma 09/30/2015   Irritable larynx 09/04/2015   Rotator cuff disorder 03/06/2015   OCD (obsessive compulsive disorder) 04/03/2013   IBS (irritable  bowel syndrome) 07/03/2012   Dysphagia 04/13/2012   Denture irritation 12/20/2011   Irritable bladder 12/20/2011   Knee pain, bilateral 12/20/2011   Hearing loss 10/11/2011   Pacemaker-Medtronic 05/18/2011   DOE (dyspnea on exertion) 04/13/2011   Gout, unspecified 04/30/2010   Anal fissure 02/03/2010   Atrial fibrillation ([place]) 08/26/2009   Sinoatrial node dysfunction ([place]) 09/17/2008   HYPOTENSION, ORTHOSTATIC 01/29/2008   Microscopic hematuria 08/21/2007   Pain in joint, pelvic region and thigh 08/01/2007   Vitamin D deficiency 05/23/2007   Anxiety disorder 05/23/2007   Hypothyroidism 03/22/2007   GERD 03/22/2007   Osteoarthritis 03/22/2007   CFS (chronic fatigue syndrome) 11/17/2006   Hypertensive heart disease 11/17/2006   ALLERGIC RHINITIS 11/17/2006   OSTEOPOROSIS 11/17/2006   BREAST CANCER, HX OF 11/17/2006    Orientation RESPIRATION BLADDER Height & Weight     Self, Place  Normal Incontinent, External catheter Weight: 106 lb 4.2 oz (48.2 kg) Height:  '5\' 4"'$  (162.6 cm)  BEHAVIORAL SYMPTOMS/MOOD NEUROLOGICAL BOWEL NUTRITION STATUS   (None)  (None) Continent Diet (DYS 3)  AMBULATORY STATUS COMMUNICATION OF NEEDS Skin   Total Care Verbally PU Stage and Appropriate Care, Other (Comment) (Cracking.)   PU Stage 2 Dressing:  (Sacrum: Foam.)                   Personal Care Assistance Level of Assistance  Bathing, Feeding, Dressing Bathing Assistance: Maximum assistance Feeding assistance: Limited assistance Dressing Assistance: Maximum assistance     Functional Limitations Info  Sight, Hearing, Speech Sight Info: Adequate Hearing Info: Adequate Speech Info: Adequate    SPECIAL CARE FACTORS FREQUENCY  PT (By licensed PT), OT (By licensed OT)     PT Frequency: 5 x week OT Frequency: 5 x week            Contractures Contractures Info: Not present    Additional Factors Info  Code Status, Allergies, Psychotropic Code Status Info: DNR Allergies Info:  Lactase, Amlodipine Besy-benazepril Hcl, Amlodipine Besylate, Clonidine Hydrochloride, Lactose Intolerance (Gi), Valsartan, Verapamil Psychotropic Info: Anxiety, Depression, OCD noted in 2014         Current Medications (11/28/2020):  This is the current hospital active medication list Current Facility-Administered Medications  Medication Dose Route Frequency Provider Last Rate Last Admin   acetaminophen (TYLENOL) tablet 650 mg  650 mg Oral Q6H PRN Agbata, Tochukwu, MD       Or   acetaminophen (TYLENOL) suppository 650 mg  650 mg Rectal Q6H PRN Agbata, Tochukwu, MD       ALPRAZolam Duanne Moron) tablet 0.5 mg  0.5 mg Oral BID PRN Agbata, Tochukwu, MD       apixaban (ELIQUIS) tablet 2.5 mg  2.5 mg Oral BID Agbata, Tochukwu, MD   2.5 mg at 11/28/20 [id number]   ascorbic acid (VITAMIN C) tablet 250 mg  250 mg Oral BID Nolberto Hanlon, MD   250 mg at 11/28/20 0954   cefTRIAXone (ROCEPHIN) 1 g in sodium chloride 0.9 % 100 mL IVPB  1 g Intravenous Q24H Nolberto Hanlon, MD   Stopped at 11/27/20 2255   divalproex (DEPAKOTE SPRINKLE) capsule 125 mg  125 mg Oral BID Agbata, Tochukwu, MD   125 mg at 11/28/20 0955   feeding supplement (BOOST / RESOURCE BREEZE) liquid 1 Container  1 Container Oral TID BM Nolberto Hanlon, MD   1 Container at 11/28/20 1401   FLUoxetine (PROZAC) capsule 20 mg  20 mg Oral Daily Agbata, Tochukwu, MD   20 mg at 11/28/20 0955   levothyroxine (SYNTHROID) tablet 50 mcg  50 mcg Oral Daily Agbata, Tochukwu, MD   50 mcg at 11/28/20 0609   MEDLINE mouth rinse  15 mL Mouth Rinse BID Agbata, Tochukwu, MD   15 mL at 11/28/20 0955   melatonin tablet 5 mg  5 mg Oral QHS Agbata, Tochukwu, MD   5 mg at 11/27/20 2209   multivitamin with minerals tablet 1 tablet  1 tablet Oral Daily Nolberto Hanlon, MD   1 tablet at 11/28/20 0955   ondansetron (ZOFRAN) tablet 4 mg  4 mg Oral Q6H PRN Agbata, Tochukwu, MD       Or   ondansetron (ZOFRAN) injection 4 mg  4 mg Intravenous Q6H PRN Agbata, Tochukwu, MD       pantoprazole  (PROTONIX) EC tablet 40 mg  40 mg Oral Daily Agbata, Tochukwu, MD   40 mg at 11/28/20 0955   polyethylene glycol (MIRALAX / GLYCOLAX) packet 17 g  17 g Oral Daily Agbata, Tochukwu, MD   17 g at 11/28/20 0956   traZODone (DESYREL) tablet 25 mg  25 mg Oral QHS PRN Agbata, Tochukwu, MD         Discharge Medications: Please see discharge summary for a list of discharge medications.  Relevant Imaging Results:  Relevant Lab Results:   Additional Information SS#: [id number]. From [place]. Daughter said she only has 1 COVID vaccine (Moderna). Daughter lives in [place] so looking for placement there.  Candie Chroman, LCSW

## 2020-11-28 NOTE — TOC Initial Note (Signed)
Transition of Care [facility]) - Initial/Assessment Note    Patient Details  Name: [name] MRN: [id number] Date of Birth: [birth year]  Transition of Care [facility]) CM/SW Contact:    Candie Chroman, LCSW Phone Number: 11/28/2020, 4:11 PM  Clinical Narrative:   Patient not fully oriented. Called patient's daughter, introduced role, and explained that therapy recommendations would be discussed. Patient's daughter is agreeable to SNF placement. She prefers the [place] area because that is where she lives. Patient only has 1 COVID vaccine. Daughter aware she will have to quarantine. No further concerns. CSW encouraged patient's daughter to contact CSW as needed. CSW will continue to follow patient and her daughter for support and facilitate discharge to SNF once medically stable. Secretary at [facility] will notify staff that she will not be returning tomorrow.               Expected Discharge Plan: Skilled Nursing Facility Barriers to Discharge: Continued Medical Work up   Patient Goals and CMS Choice Patient states their goals for this hospitalization and ongoing recovery are:: Patient not fully oriented.      Expected Discharge Plan and Services Expected Discharge Plan: [place] Choice: [place] arrangements for the past 2 months: [place]                                      Prior Living Arrangements/Services Living arrangements for the past 2 months: [place] Lives with:: Facility Resident Patient language and need for interpreter reviewed:: Yes Do you feel safe going back to the place where you live?: Yes      Need for Family Participation in Patient Care: Yes (Comment) Care giver support system in place?: Yes (comment) Current home services: DME Criminal Activity/Legal Involvement Pertinent to Current Situation/Hospitalization: No - Comment as needed  Activities of Daily Living Home  Assistive Devices/Equipment: Wheelchair ADL Screening (condition at time of admission) Patient's cognitive ability adequate to safely complete daily activities?: No Is the patient deaf or have difficulty hearing?: Yes Does the patient have difficulty seeing, even when wearing glasses/contacts?: Yes Does the patient have difficulty concentrating, remembering, or making decisions?: Yes Patient able to express need for assistance with ADLs?: No Does the patient have difficulty dressing or bathing?: Yes Independently performs ADLs?: No Communication: Independent Dressing (OT): Needs assistance Is this a change from baseline?: Pre-admission baseline Feeding: Needs assistance Is this a change from baseline?: Pre-admission baseline Bathing: Needs assistance Is this a change from baseline?: Pre-admission baseline Toileting: Needs assistance Is this a change from baseline?: Pre-admission baseline In/Out Bed: Needs assistance Is this a change from baseline?: Pre-admission baseline Walks in Home: Needs assistance Is this a change from baseline?: Pre-admission baseline Does the patient have difficulty walking or climbing stairs?: Yes Weakness of Legs: Both Weakness of Arms/Hands: Both  Permission Sought/Granted Permission sought to share information with : Facility Sport and exercise psychologist, Family Supports    Share Information with NAME: [name]  Permission granted to share info w AGENCY: [facility], SNF's  Permission granted to share info w Relationship: Daughter  Permission granted to share info w Contact Information: [phone]  Emotional Assessment Appearance:: Appears stated age Attitude/Demeanor/Rapport: Unable to Assess Affect (typically observed): Unable to Assess Orientation: : Oriented to Self, Oriented to Place Alcohol / Substance Use: Not Applicable Psych Involvement: No (comment)  Admission  diagnosis:  Acute cystitis without hematuria [N30.00] Altered mental  status, unspecified altered mental status type [R41.82] AMS (altered mental status) [R41.82] Patient Active Problem List   Diagnosis Date Noted   Protein-calorie malnutrition, severe 11/28/2020   Pressure injury of skin 11/28/2020   AMS (altered mental status) 11/26/2020   Acute lower UTI 11/26/2020   Moderate recurrent major depression ([place]) 05/09/2020   Generalized anxiety disorder 05/09/2020   Moderately severe recurrent major depression ([place]) 05/09/2020   Weight loss 10/09/2019   DVT (deep venous thrombosis) ([place]) 03/29/2018   PE (pulmonary thromboembolism) ([place]) 02/03/2018   Macrocytosis 12/21/2017   Wart 12/21/2017   Sleep apnea 07/20/2016   Sigmoid diverticulitis 07/01/2016   Acute diverticulitis    Rectal bleeding 06/30/2016   Impacted cerumen of right ear 03/26/2016   Syncope 03/22/2016   Benign essential HTN 03/22/2016   CAD (coronary artery disease) 03/22/2016   CKD (chronic kidney disease) stage 3, GFR 30-59 ml/min (HCC) 03/22/2016   Depression 03/22/2016   Exertional dyspnea 11/04/2015   Ascending aortic aneurysm ([place]) 11/04/2015   Discoid lupus 10/10/2015   Arthralgia 10/10/2015   Pulmonary hypertension ([place]) 09/30/2015   Mild persistent asthma 09/30/2015   Irritable larynx 09/04/2015   Rotator cuff disorder 03/06/2015   OCD (obsessive compulsive disorder) 04/03/2013   IBS (irritable bowel syndrome) 07/03/2012   Dysphagia 04/13/2012   Denture irritation 12/20/2011   Irritable bladder 12/20/2011   Knee pain, bilateral 12/20/2011   Hearing loss 10/11/2011   Pacemaker-Medtronic 05/18/2011   DOE (dyspnea on exertion) 04/13/2011   Gout, unspecified 04/30/2010   Anal fissure 02/03/2010   Atrial fibrillation ([place]) 08/26/2009   Sinoatrial node dysfunction ([place]) 09/17/2008   HYPOTENSION, ORTHOSTATIC 01/29/2008   Microscopic hematuria 08/21/2007   Pain in joint, pelvic region and thigh 08/01/2007   Vitamin D deficiency 05/23/2007   Anxiety disorder 05/23/2007    Hypothyroidism 03/22/2007   GERD 03/22/2007   Osteoarthritis 03/22/2007   CFS (chronic fatigue syndrome) 11/17/2006   Hypertensive heart disease 11/17/2006   ALLERGIC RHINITIS 11/17/2006   OSTEOPOROSIS 11/17/2006   BREAST CANCER, HX OF 11/17/2006   PCP:  Cassandria Anger, MD Pharmacy:   [facility], [place] [place] [address] Phone: [phone] Fax: [phone]     Social Determinants of Health (SDOH) Interventions    Readmission Risk Interventions No flowsheet data found.

## 2020-11-29 DIAGNOSIS — I10 Essential (primary) hypertension: Secondary | ICD-10-CM

## 2020-11-29 MED ORDER — SODIUM CHLORIDE 0.9 % IV SOLN
INTRAVENOUS | Status: DC | PRN
  Administered 2020-11-29: 1000 mL via INTRAVENOUS

## 2020-11-29 NOTE — Progress Notes (Signed)
PROGRESS NOTE    [name]  [id number] DOB: 09-13-1929 DOA: 11/26/2020 PCP: Cassandria Anger, MD    Brief Narrative:  Altered mental status Patient presents to the ER for evaluation of confusion At baseline she is usually able oriented to person place and time Patient has a UTI which may account for her mental status change We will place patient empirically on Rocephin 1 g IV daily Expect improvement in her mental status following resolution of her underlying urinary tract infection Will request psychiatry evaluation if mental status does not improve despite adequate treatment of UTI since patient has a history of anxiety and depression and was recently admitted to the psych unit for stabilization of her underlying depression and anxiety.    ED Course: Patient is an 87 year old African-American female who was sent to the ER from [facility] for evaluation of weakness and failure to thrive. Per nursing home staff patient was diagnosed with a UTI 2 days prior and over the last couple of days she has refused all oral intake and is noted to have become increasingly confused. She had been seen twice in the ER for evaluation of frequent falls.   8/5- more awake and interactive this am. Denies sob, cp, abd pain 8/6 no overnight issues. Pt without complaints  Consultants:    Procedures:   Antimicrobials:      Subjective: No sob, cp, dizziness  Objective: Vitals:   11/28/20 1513 11/28/20 1926 11/29/20 0503 11/29/20 0800  BP: 101/63 131/74 (!) 145/76 118/82  Pulse: 61 82 87 78  Resp:  '14 16 16  '$ Temp:  98.2 F (36.8 C) 97.7 F (36.5 C) 98 F (36.7 C)  TempSrc:  Oral Oral Oral  SpO2: 99% 99% 93% 100%  Weight:      Height:        Intake/Output Summary (Last 24 hours) at 11/29/2020 0808 Last data filed at 11/28/2020 1843 Gross per 24 hour  Intake 540 ml  Output --  Net 540 ml   Filed Weights   11/26/20 0953  Weight: 48.2 kg    Examination: Calm, NAD, sitting  in bed CTA no wheeze rales rhonchi's Regular S1-S2 no gallops Soft benign positive bowel sounds No edema Awake oriented to place and person appears to be her baseline Mood and affect appropriate in current setting   Data Reviewed: I have personally reviewed following labs and imaging studies  CBC: Recent Labs  Lab 11/23/20 0725 11/24/20 1022 11/26/20 1015  WBC 5.3 3.9* 4.4  NEUTROABS 4.0 2.8 3.3  HGB 11.6* 10.9* 11.2*  HCT 35.1* 32.6* 33.3*  MCV 105.4* 103.2* 102.5*  PLT 93* 98* 123456*   Basic Metabolic Panel: Recent Labs  Lab 11/23/20 0725 11/24/20 1022 11/26/20 1015  NA 142 142 145  K 4.6 3.3* 3.6  CL 103 104 106  CO2 '23 26 25  '$ GLUCOSE 50* 62* 86  BUN 31* 34* 41*  CREATININE 1.17* 1.03* 1.15*  CALCIUM 8.6* 8.5* 9.2   GFR: Estimated Creatinine Clearance: 25.7 mL/min (A) (by C-G formula based on SCr of 1.15 mg/dL (H)). Liver Function Tests: Recent Labs  Lab 11/24/20 1022 11/26/20 1015  AST 23 25  ALT 13 17  ALKPHOS 40 42  BILITOT 1.1 1.2  PROT 6.2* 6.2*  ALBUMIN 3.2* 3.0*   Recent Labs  Lab 11/26/20 1015  LIPASE 21   No results for input(s): AMMONIA in the last 168 hours. Coagulation Profile: No results for input(s): INR, PROTIME in the last 168 hours. Cardiac  Enzymes: Recent Labs  Lab 11/26/20 1444  CKTOTAL 397*   BNP (last 3 results) No results for input(s): PROBNP in the last 8760 hours. HbA1C: No results for input(s): HGBA1C in the last 72 hours. CBG: Recent Labs  Lab 11/27/20 1124 11/27/20 1159  GLUCAP 52* 141*   Lipid Profile: No results for input(s): CHOL, HDL, LDLCALC, TRIG, CHOLHDL, LDLDIRECT in the last 72 hours. Thyroid Function Tests: No results for input(s): TSH, T4TOTAL, FREET4, T3FREE, THYROIDAB in the last 72 hours. Anemia Panel: No results for input(s): VITAMINB12, FOLATE, FERRITIN, TIBC, IRON, RETICCTPCT in the last 72 hours. Sepsis Labs: Recent Labs  Lab 11/26/20 1015  LATICACIDVEN 0.9    Recent Results (from  the past 240 hour(s))  Urine Culture     Status: Abnormal   Collection Time: 11/26/20 10:15 AM   Specimen: Urine, Catheterized  Result Value Ref Range Status   Specimen Description   Final    URINE, CATHETERIZED Performed at [facility], [address], [place], [place] [address]    Special Requests   Final    NONE Performed at [facility], [address], [place], [place] [address]    Culture (A)  Final    <10,000 COLONIES/mL INSIGNIFICANT GROWTH Performed at [facility], [place] [address], [place], [place] [address]    Report Status 11/28/2020 FINAL  Final  Culture, blood (routine x 2)     Status: None (Preliminary result)   Collection Time: 11/26/20 10:15 AM   Specimen: BLOOD  Result Value Ref Range Status   Specimen Description BLOOD  LEFT FA  Final   Special Requests   Final    BOTTLES DRAWN AEROBIC AND ANAEROBIC Blood Culture adequate volume   Culture   Final    NO GROWTH 3 DAYS Performed at [facility], [address] [place], [place] [address]    Report Status PENDING  Incomplete  Culture, blood (routine x 2)     Status: None (Preliminary result)   Collection Time: 11/26/20 10:15 AM   Specimen: BLOOD  Result Value Ref Range Status   Specimen Description BLOOD  RT FA  Final   Special Requests   Final    BOTTLES DRAWN AEROBIC AND ANAEROBIC Blood Culture results may not be optimal due to an excessive volume of blood received in culture bottles   Culture   Final    NO GROWTH 3 DAYS Performed at [facility], [address] [place],  [address]    Report Status PENDING  Incomplete  Resp Panel by RT-PCR (Flu A&B, Covid) Nasopharyngeal Swab     Status: None   Collection Time: 11/26/20 11:27 AM   Specimen: Nasopharyngeal Swab; Nasopharyngeal(NP) swabs in vial transport medium  Result Value Ref Range Status   SARS Coronavirus 2 by RT PCR NEGATIVE NEGATIVE Final    Comment: (NOTE) SARS-CoV-2 target nucleic acids are  NOT DETECTED.  The SARS-CoV-2 RNA is generally detectable in upper respiratory specimens during the acute phase of infection. The lowest concentration of SARS-CoV-2 viral copies this assay can detect is 138 copies/mL. A negative result does not preclude SARS-Cov-2 infection and should not be used as the sole basis for treatment or other patient management decisions. A negative result may occur with  improper specimen collection/handling, submission of specimen other than nasopharyngeal swab, presence of viral mutation(s) within the areas targeted by this assay, and inadequate number of viral copies(<138 copies/mL). A negative result must be combined with clinical observations, patient  history, and epidemiological information. The expected result is Negative.  Fact Sheet for Patients:  EntrepreneurPulse.com.au  Fact Sheet for Healthcare Providers:  IncredibleEmployment.be  This test is no t yet approved or cleared by the [place] FDA and  has been authorized for detection and/or diagnosis of SARS-CoV-2 by FDA under an Emergency Use Authorization (EUA). This EUA will remain  in effect (meaning this test can be used) for the duration of the COVID-19 declaration under Section 564(b)(1) of the Act, 21 U.S.C.section 360bbb-3(b)(1), unless the authorization is terminated  or revoked sooner.       Influenza A by PCR NEGATIVE NEGATIVE Final   Influenza B by PCR NEGATIVE NEGATIVE Final    Comment: (NOTE) The Xpert Xpress SARS-CoV-2/FLU/RSV plus assay is intended as an aid in the diagnosis of influenza from Nasopharyngeal swab specimens and should not be used as a sole basis for treatment. Nasal washings and aspirates are unacceptable for Xpert Xpress SARS-CoV-2/FLU/RSV testing.  Fact Sheet for Patients: EntrepreneurPulse.com.au  Fact Sheet for Healthcare Providers: IncredibleEmployment.be  This test is not  yet approved or cleared by the [place] FDA and has been authorized for detection and/or diagnosis of SARS-CoV-2 by FDA under an Emergency Use Authorization (EUA). This EUA will remain in effect (meaning this test can be used) for the duration of the COVID-19 declaration under Section 564(b)(1) of the Act, 21 U.S.C. section 360bbb-3(b)(1), unless the authorization is terminated or revoked.  Performed at [facility], [address] [place],  [address]          Radiology Studies: No results found.      Scheduled Meds:  apixaban  2.5 mg Oral BID   vitamin C  250 mg Oral BID   divalproex  125 mg Oral BID   feeding supplement  1 Container Oral TID BM   FLUoxetine  20 mg Oral Daily   levothyroxine  50 mcg Oral Daily   mouth rinse  15 mL Mouth Rinse BID   melatonin  5 mg Oral QHS   multivitamin with minerals  1 tablet Oral Daily   pantoprazole  40 mg Oral Daily   polyethylene glycol  17 g Oral Daily   Continuous Infusions:  cefTRIAXone (ROCEPHIN)  IV 1 g (11/28/20 2158)    Assessment & Plan:   Principal Problem:   AMS (altered mental status) Active Problems:   Hypothyroidism   Anxiety disorder   Atrial fibrillation (HCC)   Benign essential HTN   CKD (chronic kidney disease) stage 3, GFR 30-59 ml/min (HCC)   Depression   Acute lower UTI   Protein-calorie malnutrition, severe   Pressure injury of skin   Altered mental status Patient presents to the ER for evaluation of confusion At baseline she is usually able oriented to person place and time 8/6-secondary to UTI At baseline now, improved Urine culture less than 10,000 Blood cultures today negative Continue on Rocephin     History of atrial fibrillation Continue Eliquis  If has frequent falls may need to discuss risk versus benefit prior to discharge about anticoagulation .this a.m. spoke to daughter about risk versus benefit with her multiple falls.  She would like to discuss it  with her brother and let us know whether to continue Eliquis or not by discharge.       Urinary tract infection Continue Rocephin     Hypothyroidism Continue Synthroid     Anxiety and depression Continue Xanax as needed, Depakote, trazodone and fluoxetine  Frequent falls Place patient on fall precautions PT evaluation once mental status improves   Severe malnutrition  related to chronic disease illness Nutrition following   Consult PT OT   DVT prophylaxis: Eliquis Code Status: DNR Family Communication: Updated daughter Disposition Plan:  Status is: Inpatient  Remains inpatient appropriate because:Inpatient level of care appropriate due to severity of illness  Dispo: The patient is from: Home              Anticipated d/c is to: Home              Patient currently is not medically stable to d/c.   Difficult to place patient No            LOS: 3 days   Time spent: 35 minutes with more than 50% on [place], MD [facility] Pager [phone]  If 7PM-7AM, please contact night-coverage 11/29/2020, 8:08 AM

## 2020-11-29 NOTE — Plan of Care (Signed)

## 2020-11-29 NOTE — TOC Progression Note (Signed)
Transition of Care [facility]) - Progression Note    Patient Details  Name: [name] MRN: [id number] Date of Birth: 03-23-1930  [facility]) CM/SW Contact  Eileen Stanford, LCSW Phone Number: 11/29/2020, 10:27 AM  Clinical Narrative:  CSW spoke to pt's daughter and she has accepted bed at [facility].     Expected Discharge Plan: [place] Barriers to Discharge: Continued Medical Work up  Expected Discharge Plan and Services Expected Discharge Plan: [place] Choice: [place] arrangements for the past 2 months: Assisted Living Facility                                       Social Determinants of Health (SDOH) Interventions    Readmission Risk Interventions No flowsheet data found.

## 2020-11-30 LAB — MRSA NEXT GEN BY PCR, NASAL: MRSA by PCR Next Gen: NOT DETECTED

## 2020-11-30 NOTE — TOC Progression Note (Addendum)
Transition of Care [facility]) - Progression Note    Patient Details  Name: [name] MRN: [id number] Date of Birth: 04/01/1930  Transition of Care [facility]) CM/SW [place], LCSW Phone Number: 11/30/2020, 8:19 AM  Clinical Narrative:  Uploaded requested documents into [place] for PASARR review.   3:28 pm: PASARR still under review.  Expected Discharge Plan: [place] Barriers to Discharge: Continued Medical Work up  Expected Discharge Plan and Services Expected Discharge Plan: [place] Choice: [place] arrangements for the past 2 months: Assisted Living Facility                                       Social Determinants of Health (SDOH) Interventions    Readmission Risk Interventions No flowsheet data found.

## 2020-11-30 NOTE — Progress Notes (Signed)
PROGRESS NOTE    [name]  [id number] DOB: 1929/10/11 DOA: 11/26/2020 PCP: Cassandria Anger, MD    Brief Narrative:  Altered mental status Patient presents to the ER for evaluation of confusion At baseline she is usually able oriented to person place and time Patient has a UTI which may account for her mental status change We will place patient empirically on Rocephin 1 g IV daily Expect improvement in her mental status following resolution of her underlying urinary tract infection Will request psychiatry evaluation if mental status does not improve despite adequate treatment of UTI since patient has a history of anxiety and depression and was recently admitted to the psych unit for stabilization of her underlying depression and anxiety.    ED Course: Patient is an 87 year old African-American female who was sent to the ER from [place] for evaluation of weakness and failure to thrive. Per nursing home staff patient was diagnosed with a UTI 2 days prior and over the last couple of days she has refused all oral intake and is noted to have become increasingly confused. She had been seen twice in the ER for evaluation of frequent falls.   8/5- more awake and interactive this am. Denies sob, cp, abd pain 8/6 no overnight issues. Pt without complaints 8/7 no overnight issues.  Consultants:    Procedures:   Antimicrobials:      Subjective: Patient denies any shortness of breath, dizziness, abdominal pain.  Objective: Vitals:   11/29/20 0800 11/29/20 1550 11/29/20 1918 11/30/20 0323  BP: 118/82 129/83 128/85 108/68  Pulse: 78 85 83 60  Resp: '16 16 16 14  '$ Temp: 98 F (36.7 C) 97.8 F (36.6 C) (!) 97.5 F (36.4 C) 98 F (36.7 C)  TempSrc: Oral Oral Oral Oral  SpO2: 100% 96% 100% 100%  Weight:      Height:        Intake/Output Summary (Last 24 hours) at 11/30/2020 [id number] Last data filed at 11/30/2020 0511 Gross per 24 hour  Intake 360.2 ml  Output --  Net  360.2 ml   Filed Weights   11/26/20 0953  Weight: 48.2 kg    Examination: NAD, calm CTA no wheeze rales rhonchi's Regular S1-S2 no gallops Soft benign positive bowel sounds No edema Awake and alert Mood and affect appropriate in current setting   Data Reviewed: I have personally reviewed following labs and imaging studies  CBC: Recent Labs  Lab 11/24/20 1022 11/26/20 1015  WBC 3.9* 4.4  NEUTROABS 2.8 3.3  HGB 10.9* 11.2*  HCT 32.6* 33.3*  MCV 103.2* 102.5*  PLT 98* [id number]*   Basic Metabolic Panel: Recent Labs  Lab 11/24/20 1022 11/26/20 1015  NA 142 145  K 3.3* 3.6  CL 104 106  CO2 26 25  GLUCOSE 62* 86  BUN 34* 41*  CREATININE 1.03* 1.15*  CALCIUM 8.5* 9.2   GFR: Estimated Creatinine Clearance: 25.7 mL/min (A) (by C-G formula based on SCr of 1.15 mg/dL (H)). Liver Function Tests: Recent Labs  Lab 11/24/20 1022 11/26/20 1015  AST 23 25  ALT 13 17  ALKPHOS 40 42  BILITOT 1.1 1.2  PROT 6.2* 6.2*  ALBUMIN 3.2* 3.0*   Recent Labs  Lab 11/26/20 1015  LIPASE 21   No results for input(s): AMMONIA in the last 168 hours. Coagulation Profile: No results for input(s): INR, PROTIME in the last 168 hours. Cardiac Enzymes: Recent Labs  Lab 11/26/20 1444  CKTOTAL 397*   BNP (last 3 results) No  results for input(s): PROBNP in the last 8760 hours. HbA1C: No results for input(s): HGBA1C in the last 72 hours. CBG: Recent Labs  Lab 11/27/20 1124 11/27/20 1159  GLUCAP 52* 141*   Lipid Profile: No results for input(s): CHOL, HDL, LDLCALC, TRIG, CHOLHDL, LDLDIRECT in the last 72 hours. Thyroid Function Tests: No results for input(s): TSH, T4TOTAL, FREET4, T3FREE, THYROIDAB in the last 72 hours. Anemia Panel: No results for input(s): VITAMINB12, FOLATE, FERRITIN, TIBC, IRON, RETICCTPCT in the last 72 hours. Sepsis Labs: Recent Labs  Lab 11/26/20 1015  LATICACIDVEN 0.9    Recent Results (from the past 240 hour(s))  Urine Culture     Status:  Abnormal   Collection Time: 11/26/20 10:15 AM   Specimen: Urine, Catheterized  Result Value Ref Range Status   Specimen Description   Final    URINE, CATHETERIZED Performed at [facility], [address] [place], [place] [address]    Special Requests   Final    NONE Performed at [facility], [address] [place], [place] [address]    Culture (A)  Final    <10,000 COLONIES/mL INSIGNIFICANT GROWTH Performed at [facility], [place] [address], [place], [place] [address]    Report Status 11/28/2020 FINAL  Final  Culture, blood (routine x 2)     Status: None (Preliminary result)   Collection Time: 11/26/20 10:15 AM   Specimen: BLOOD  Result Value Ref Range Status   Specimen Description BLOOD  LEFT FA  Final   Special Requests   Final    BOTTLES DRAWN AEROBIC AND ANAEROBIC Blood Culture adequate volume   Culture   Final    NO GROWTH 4 DAYS Performed at [facility], [address] [place], [place] [address]    Report Status PENDING  Incomplete  Culture, blood (routine x 2)     Status: None (Preliminary result)   Collection Time: 11/26/20 10:15 AM   Specimen: BLOOD  Result Value Ref Range Status   Specimen Description BLOOD  RT FA  Final   Special Requests   Final    BOTTLES DRAWN AEROBIC AND ANAEROBIC Blood Culture results may not be optimal due to an excessive volume of blood received in culture bottles   Culture   Final    NO GROWTH 4 DAYS Performed at [facility], [address] [place],  [address]    Report Status PENDING  Incomplete  Resp Panel by RT-PCR (Flu A&B, Covid) Nasopharyngeal Swab     Status: None   Collection Time: 11/26/20 11:27 AM   Specimen: Nasopharyngeal Swab; Nasopharyngeal(NP) swabs in vial transport medium  Result Value Ref Range Status   SARS Coronavirus 2 by RT PCR NEGATIVE NEGATIVE Final    Comment: (NOTE) SARS-CoV-2 target nucleic acids are NOT DETECTED.  The SARS-CoV-2 RNA is generally  detectable in upper respiratory specimens during the acute phase of infection. The lowest concentration of SARS-CoV-2 viral copies this assay can detect is 138 copies/mL. A negative result does not preclude SARS-Cov-2 infection and should not be used as the sole basis for treatment or other patient management decisions. A negative result may occur with  improper specimen collection/handling, submission of specimen other than nasopharyngeal swab, presence of viral mutation(s) within the areas targeted by this assay, and inadequate number of viral copies(<138 copies/mL). A negative result must be combined with clinical observations, patient history, and epidemiological information. The expected result is Negative.  Fact Sheet for Patients:  EntrepreneurPulse.com.au  Fact Sheet for Healthcare Providers:  IncredibleEmployment.be  This test is no t yet approved or cleared by the [place] FDA and  has been authorized for detection and/or diagnosis of SARS-CoV-2 by FDA under an Emergency Use Authorization (EUA). This EUA will remain  in effect (meaning this test can be used) for the duration of the COVID-19 declaration under Section 564(b)(1) of the Act, 21 U.S.C.section 360bbb-3(b)(1), unless the authorization is terminated  or revoked sooner.       Influenza A by PCR NEGATIVE NEGATIVE Final   Influenza B by PCR NEGATIVE NEGATIVE Final    Comment: (NOTE) The Xpert Xpress SARS-CoV-2/FLU/RSV plus assay is intended as an aid in the diagnosis of influenza from Nasopharyngeal swab specimens and should not be used as a sole basis for treatment. Nasal washings and aspirates are unacceptable for Xpert Xpress SARS-CoV-2/FLU/RSV testing.  Fact Sheet for Patients: EntrepreneurPulse.com.au  Fact Sheet for Healthcare Providers: IncredibleEmployment.be  This test is not yet approved or cleared by the [place] FDA  and has been authorized for detection and/or diagnosis of SARS-CoV-2 by FDA under an Emergency Use Authorization (EUA). This EUA will remain in effect (meaning this test can be used) for the duration of the COVID-19 declaration under Section 564(b)(1) of the Act, 21 U.S.C. section 360bbb-3(b)(1), unless the authorization is terminated or revoked.  Performed at [facility], [address] [place], [place] [address]          Radiology Studies: No results found.      Scheduled Meds:  apixaban  2.5 mg Oral BID   vitamin C  250 mg Oral BID   divalproex  125 mg Oral BID   feeding supplement  1 Container Oral TID BM   FLUoxetine  20 mg Oral Daily   levothyroxine  50 mcg Oral Daily   mouth rinse  15 mL Mouth Rinse BID   melatonin  5 mg Oral QHS   multivitamin with minerals  1 tablet Oral Daily   pantoprazole  40 mg Oral Daily   polyethylene glycol  17 g Oral Daily   Continuous Infusions:  sodium chloride Stopped (11/29/20 2256)    Assessment & Plan:   Principal Problem:   AMS (altered mental status) Active Problems:   Hypothyroidism   Anxiety disorder   Atrial fibrillation (HCC)   Benign essential HTN   CKD (chronic kidney disease) stage 3, GFR 30-59 ml/min (HCC)   Depression   Acute lower UTI   Protein-calorie malnutrition, severe   Pressure injury of skin   Altered mental status Patient presents to the ER for evaluation of confusion At baseline she is usually able oriented to person place and time 8/7 secondary to UTI  MS at baseline now  Urine culture less than 10,000 species  Blood culture to date negative  Completed rocephin course     History of atrial fibrillation Continue Eliquis  If has frequent falls may need to discuss risk versus benefit prior to discharge about anticoagulation .this a.m. spoke to daughter about risk versus benefit with her multiple falls.  She would like to discuss it with her brother and let us know whether to  continue Eliquis or not by discharge.   8/7-we will need to discuss with daughter again prior to discharge to see if she wants to continue with Eliquis or not.     Urinary tract infection Completed Rocephin course     Hypothyroidism Continue Synthroid     Anxiety and depression Continue Xanax  as needed, Depakote, trazodone and fluoxetine       Frequent falls Place patient on fall precautions PT evaluation once mental status improves   Severe malnutrition  related to chronic disease illness Nutrition following   Consult PT OT   DVT prophylaxis: Eliquis Code Status: DNR Family Communication: None at bedside Disposition Plan:  Status is: Inpatient  Remains inpatient appropriate because: Unsafe discharge   dispo: The patient is from: Home              Anticipated d/c is to: Home              Patient currently is medically stable   Difficult to place patient No     Plan for SNF, authorization pending.      LOS: 4 days   Time spent: 35 minutes with more than 50% on [place], MD [facility] Pager [phone]  If 7PM-7AM, please contact night-coverage 11/30/2020, 8:12 AM

## 2020-12-01 ENCOUNTER — Ambulatory Visit: Payer: Medicare Other | Admitting: Psychology

## 2020-12-01 ENCOUNTER — Inpatient Hospital Stay: Payer: Medicare Other

## 2020-12-01 DIAGNOSIS — F331 Major depressive disorder, recurrent, moderate: Secondary | ICD-10-CM | POA: Diagnosis not present

## 2020-12-01 DIAGNOSIS — Z9012 Acquired absence of left breast and nipple: Secondary | ICD-10-CM | POA: Diagnosis not present

## 2020-12-01 DIAGNOSIS — N390 Urinary tract infection, site not specified: Secondary | ICD-10-CM | POA: Diagnosis not present

## 2020-12-01 DIAGNOSIS — Z9119 Patient's noncompliance with other medical treatment and regimen: Secondary | ICD-10-CM | POA: Diagnosis not present

## 2020-12-01 DIAGNOSIS — M24541 Contracture, right hand: Secondary | ICD-10-CM | POA: Diagnosis not present

## 2020-12-01 DIAGNOSIS — R0689 Other abnormalities of breathing: Secondary | ICD-10-CM | POA: Diagnosis not present

## 2020-12-01 DIAGNOSIS — R404 Transient alteration of awareness: Secondary | ICD-10-CM | POA: Diagnosis not present

## 2020-12-01 DIAGNOSIS — R899 Unspecified abnormal finding in specimens from other organs, systems and tissues: Secondary | ICD-10-CM | POA: Diagnosis not present

## 2020-12-01 DIAGNOSIS — R627 Adult failure to thrive: Secondary | ICD-10-CM | POA: Diagnosis not present

## 2020-12-01 DIAGNOSIS — Z743 Need for continuous supervision: Secondary | ICD-10-CM | POA: Diagnosis not present

## 2020-12-01 DIAGNOSIS — I4891 Unspecified atrial fibrillation: Secondary | ICD-10-CM | POA: Diagnosis present

## 2020-12-01 DIAGNOSIS — L89159 Pressure ulcer of sacral region, unspecified stage: Secondary | ICD-10-CM | POA: Diagnosis not present

## 2020-12-01 DIAGNOSIS — E441 Mild protein-calorie malnutrition: Secondary | ICD-10-CM | POA: Diagnosis not present

## 2020-12-01 DIAGNOSIS — J45909 Unspecified asthma, uncomplicated: Secondary | ICD-10-CM | POA: Diagnosis not present

## 2020-12-01 DIAGNOSIS — I130 Hypertensive heart and chronic kidney disease with heart failure and stage 1 through stage 4 chronic kidney disease, or unspecified chronic kidney disease: Secondary | ICD-10-CM | POA: Diagnosis not present

## 2020-12-01 DIAGNOSIS — Z515 Encounter for palliative care: Secondary | ICD-10-CM | POA: Diagnosis not present

## 2020-12-01 DIAGNOSIS — Z950 Presence of cardiac pacemaker: Secondary | ICD-10-CM | POA: Diagnosis not present

## 2020-12-01 DIAGNOSIS — Z853 Personal history of malignant neoplasm of breast: Secondary | ICD-10-CM | POA: Diagnosis not present

## 2020-12-01 DIAGNOSIS — F320 Major depressive disorder, single episode, mild: Secondary | ICD-10-CM | POA: Diagnosis not present

## 2020-12-01 DIAGNOSIS — I2510 Atherosclerotic heart disease of native coronary artery without angina pectoris: Secondary | ICD-10-CM | POA: Diagnosis not present

## 2020-12-01 DIAGNOSIS — R1084 Generalized abdominal pain: Secondary | ICD-10-CM | POA: Diagnosis not present

## 2020-12-01 DIAGNOSIS — G4700 Insomnia, unspecified: Secondary | ICD-10-CM | POA: Diagnosis not present

## 2020-12-01 DIAGNOSIS — R410 Disorientation, unspecified: Secondary | ICD-10-CM | POA: Diagnosis not present

## 2020-12-01 DIAGNOSIS — N3946 Mixed incontinence: Secondary | ICD-10-CM | POA: Diagnosis not present

## 2020-12-01 DIAGNOSIS — R4182 Altered mental status, unspecified: Secondary | ICD-10-CM | POA: Diagnosis present

## 2020-12-01 DIAGNOSIS — R457 State of emotional shock and stress, unspecified: Secondary | ICD-10-CM | POA: Diagnosis not present

## 2020-12-01 DIAGNOSIS — R630 Anorexia: Secondary | ICD-10-CM | POA: Diagnosis not present

## 2020-12-01 DIAGNOSIS — E860 Dehydration: Secondary | ICD-10-CM | POA: Diagnosis not present

## 2020-12-01 DIAGNOSIS — I959 Hypotension, unspecified: Secondary | ICD-10-CM | POA: Diagnosis not present

## 2020-12-01 DIAGNOSIS — E039 Hypothyroidism, unspecified: Secondary | ICD-10-CM | POA: Diagnosis present

## 2020-12-01 DIAGNOSIS — Z79899 Other long term (current) drug therapy: Secondary | ICD-10-CM | POA: Diagnosis not present

## 2020-12-01 DIAGNOSIS — Z7401 Bed confinement status: Secondary | ICD-10-CM | POA: Diagnosis not present

## 2020-12-01 DIAGNOSIS — M6281 Muscle weakness (generalized): Secondary | ICD-10-CM | POA: Diagnosis not present

## 2020-12-01 DIAGNOSIS — R110 Nausea: Secondary | ICD-10-CM | POA: Diagnosis not present

## 2020-12-01 DIAGNOSIS — E46 Unspecified protein-calorie malnutrition: Secondary | ICD-10-CM | POA: Diagnosis present

## 2020-12-01 DIAGNOSIS — I129 Hypertensive chronic kidney disease with stage 1 through stage 4 chronic kidney disease, or unspecified chronic kidney disease: Secondary | ICD-10-CM | POA: Diagnosis not present

## 2020-12-01 DIAGNOSIS — I951 Orthostatic hypotension: Secondary | ICD-10-CM | POA: Diagnosis not present

## 2020-12-01 DIAGNOSIS — R55 Syncope and collapse: Secondary | ICD-10-CM | POA: Diagnosis not present

## 2020-12-01 DIAGNOSIS — R197 Diarrhea, unspecified: Secondary | ICD-10-CM | POA: Diagnosis not present

## 2020-12-01 DIAGNOSIS — F419 Anxiety disorder, unspecified: Secondary | ICD-10-CM | POA: Diagnosis present

## 2020-12-01 DIAGNOSIS — L89150 Pressure ulcer of sacral region, unstageable: Secondary | ICD-10-CM | POA: Diagnosis not present

## 2020-12-01 DIAGNOSIS — K219 Gastro-esophageal reflux disease without esophagitis: Secondary | ICD-10-CM | POA: Diagnosis not present

## 2020-12-01 DIAGNOSIS — R1310 Dysphagia, unspecified: Secondary | ICD-10-CM | POA: Diagnosis not present

## 2020-12-01 DIAGNOSIS — E069 Thyroiditis, unspecified: Secondary | ICD-10-CM | POA: Diagnosis not present

## 2020-12-01 DIAGNOSIS — R5383 Other fatigue: Secondary | ICD-10-CM | POA: Diagnosis not present

## 2020-12-01 DIAGNOSIS — F32A Depression, unspecified: Secondary | ICD-10-CM | POA: Diagnosis present

## 2020-12-01 DIAGNOSIS — U071 COVID-19: Secondary | ICD-10-CM | POA: Diagnosis present

## 2020-12-01 DIAGNOSIS — D696 Thrombocytopenia, unspecified: Secondary | ICD-10-CM | POA: Diagnosis not present

## 2020-12-01 DIAGNOSIS — R54 Age-related physical debility: Secondary | ICD-10-CM | POA: Diagnosis not present

## 2020-12-01 DIAGNOSIS — R7309 Other abnormal glucose: Secondary | ICD-10-CM | POA: Diagnosis not present

## 2020-12-01 DIAGNOSIS — L89301 Pressure ulcer of unspecified buttock, stage 1: Secondary | ICD-10-CM | POA: Diagnosis not present

## 2020-12-01 DIAGNOSIS — F0390 Unspecified dementia without behavioral disturbance: Secondary | ICD-10-CM | POA: Diagnosis not present

## 2020-12-01 DIAGNOSIS — R100 Acute abdomen: Secondary | ICD-10-CM | POA: Diagnosis not present

## 2020-12-01 DIAGNOSIS — N183 Chronic kidney disease, stage 3 unspecified: Secondary | ICD-10-CM | POA: Diagnosis present

## 2020-12-01 DIAGNOSIS — Z955 Presence of coronary angioplasty implant and graft: Secondary | ICD-10-CM | POA: Diagnosis not present

## 2020-12-01 DIAGNOSIS — R0602 Shortness of breath: Secondary | ICD-10-CM | POA: Diagnosis not present

## 2020-12-01 DIAGNOSIS — Z7901 Long term (current) use of anticoagulants: Secondary | ICD-10-CM | POA: Diagnosis not present

## 2020-12-01 DIAGNOSIS — I10 Essential (primary) hypertension: Secondary | ICD-10-CM | POA: Diagnosis present

## 2020-12-01 DIAGNOSIS — R296 Repeated falls: Secondary | ICD-10-CM | POA: Diagnosis not present

## 2020-12-01 DIAGNOSIS — I509 Heart failure, unspecified: Secondary | ICD-10-CM | POA: Diagnosis not present

## 2020-12-01 LAB — RESP PANEL BY RT-PCR (FLU A&B, COVID) ARPGX2
Influenza A by PCR: NEGATIVE
Influenza B by PCR: NEGATIVE
SARS Coronavirus 2 by RT PCR: NEGATIVE

## 2020-12-01 LAB — CULTURE, BLOOD (ROUTINE X 2)
Culture: NO GROWTH
Culture: NO GROWTH
Special Requests: ADEQUATE

## 2020-12-01 MED ORDER — ADULT MULTIVITAMIN W/MINERALS CH: 1.0000 | ORAL_TABLET | Freq: Every day | ORAL | Status: DC

## 2020-12-01 MED ORDER — DIVALPROEX SODIUM 125 MG PO CSDR
125.0000 mg | DELAYED_RELEASE_CAPSULE | Freq: Two times a day (BID) | ORAL | Status: DC
Start: 2020-12-01 — End: 2021-11-15

## 2020-12-01 MED ORDER — ASCORBIC ACID 250 MG PO TABS: 250.0000 mg | ORAL_TABLET | Freq: Two times a day (BID) | ORAL | Status: DC

## 2020-12-01 MED ORDER — ALPRAZOLAM 0.5 MG PO TABS: 0.5000 mg | ORAL_TABLET | Freq: Two times a day (BID) | ORAL | 0 refills | Status: AC | PRN

## 2020-12-01 NOTE — Discharge Summary (Addendum)
[name] [id number] DOB: Mar 31, 1930 DOA: 11/26/2020  PCP: Cassandria Anger, MD  Admit date: 11/26/2020 Discharge date: 12/01/2020  Admitted From: SNF Disposition:  SNF  Recommendations for Outpatient Follow-up:  Follow up with PCP in 1 week Please obtain BMP/CBC in one week      Discharge Condition:Stable CODE STATUS: DNR Diet recommendation: Dysphagia 3 diet    Brief/Interim Summary: Per [id number] [place] is a 87 y.o. female with medical history significant for hypertension, chronic kidney disease, CHF, depression and anxiety who presents to the ER for the third time in the last week. She was initially seen in the emergency room on July 28 for evaluation of a fall.  She was seen again on July 31 for evaluation of a fall and at that time had pyuria.  She received a dose of Rocephin and was discharged back to the skilled nursing facility on Keflex.  Per nursing staff she only had 1 tablet of the Keflex and has not been able to take any more tablets because of poor oral intake.   Per nursing home staff she has refused intake of medication and food and also appeared to be more confused when compared to her baseline.  CT scan of the head without contrast shows no acute or interval finding.  Generalized atrophy.  Patient was admitted to the hospital for altered mental status.  Please see below about assessment and treatment.  She is at baseline and stable to be discharged.   Altered mental status Patient presents to the ER for evaluation of confusion At baseline she is usually able oriented to person place and time Altered mental status likely secondary to UTI Was treated with IV antibiotics empirically Urine culture less than 10,000 species Blood culture to date negative  Completed rocephin course Has improved and at baseline.   8/8 today daughter felt pt left arm may be weaker than usual.MRI brain unable to do due to ppm per radiology. Repeat ct head today negative for acute  abnormality.     History of atrial fibrillation Continue Eliquis Discussed risk versus benefit of anticoagulation with daughter since patient had multiple falls including brain hemorrhage versus stroke without anticoagulation and a substitution with aspirin.  After long discussion she spoke to her sibling and have decided to continue Eliquis at this time     Urinary tract infection Completed Rocephin course     Hypothyroidism Continue Synthroid     Anxiety and depression Continue Xanax as needed, Depakote, trazodone and fluoxetine       Frequent falls Place patient on fall precautions Plan to go to rehab today   Severe malnutrition related to chronic disease illness Needs Ensure 3 times daily May need help with feeding          Discharge Diagnoses:  Principal Problem:   AMS (altered mental status) Active Problems:   Hypothyroidism   Anxiety disorder   Atrial fibrillation (HCC)   Benign essential HTN   CKD (chronic kidney disease) stage 3, GFR 30-59 ml/min (HCC)   Depression   Acute lower UTI   Protein-calorie malnutrition, severe   Pressure injury of skin    Discharge Instructions  Discharge Instructions     Diet - low sodium heart healthy   Complete by: As directed    Discharge wound care:   Complete by: As directed    As above. Frequent turns   Increase activity slowly   Complete by: As directed       Allergies as of 12/01/2020  Reactions   Lactase Diarrhea   Amlodipine Besy-benazepril Hcl Swelling   Amlodipine Besylate Swelling   Clonidine Hydrochloride    REACTION: tired   Lactose Intolerance (gi) Diarrhea, Other (See Comments)   Valsartan Other (See Comments)   Tongue swelling    Verapamil Other (See Comments)   unknown        Medication List     STOP taking these medications    cephALEXin 500 MG capsule Commonly known as: KEFLEX   clidinium-chlordiazePOXIDE 5-2.5 MG capsule Commonly known as: LIBRAX   hydrOXYzine  25 MG capsule Commonly known as: VISTARIL   risperiDONE 0.5 MG tablet Commonly known as: RISPERDAL       TAKE these medications    ALPRAZolam 0.5 MG tablet Commonly known as: XANAX Take 1 tablet (0.5 mg total) by mouth 2 (two) times daily as needed for up to 3 days for anxiety.   ascorbic acid 250 MG tablet Commonly known as: VITAMIN C Take 1 tablet (250 mg total) by mouth 2 (two) times daily.   divalproex 125 MG capsule Commonly known as: DEPAKOTE SPRINKLE Take 1 capsule (125 mg total) by mouth 2 (two) times daily. What changed:  when to take this additional instructions   Eliquis 2.5 MG Tabs tablet Generic drug: apixaban Take 2.5 mg by mouth 2 (two) times daily.   FLUoxetine 10 MG tablet Commonly known as: PROZAC Take 20 mg by mouth daily.   levothyroxine 50 MCG tablet Commonly known as: SYNTHROID Take 50 mcg by mouth daily.   melatonin 5 MG Tabs Take 5 mg by mouth at bedtime.   multivitamin with minerals Tabs tablet Take 1 tablet by mouth daily. Start taking on: December 02, 2020   pantoprazole 40 MG tablet Commonly known as: PROTONIX Take 1 tablet (40 mg total) by mouth daily.   polyethylene glycol 17 g packet Commonly known as: MiraLax Mix-In Pax Take 17 g by mouth daily.   traZODone 50 MG tablet Commonly known as: DESYREL Take 25 mg by mouth at bedtime as needed.               Discharge Care Instructions  (From admission, onward)           Start     Ordered   12/01/20 0000  Discharge wound care:       Comments: As above. Frequent turns   12/01/20 1236            Contact information for follow-up providers     Plotnikov, Evie Lacks, MD Follow up in 1 week(s).   Specialty: Internal Medicine Contact information: [place] [address] [phone]              Contact information for after-discharge care     Destination     [facility] .   Service: Skilled Nursing Contact  information: [address] [address] [phone]                    Allergies  Allergen Reactions   Lactase Diarrhea   Amlodipine Besy-Benazepril Hcl Swelling   Amlodipine Besylate Swelling   Clonidine Hydrochloride     REACTION: tired   Lactose Intolerance (Gi) Diarrhea and Other (See Comments)   Valsartan Other (See Comments)    Tongue swelling    Verapamil Other (See Comments)    unknown    Consultations:    Procedures/Studies: CT ABDOMEN PELVIS WO CONTRAST  Result Date:  11/08/2020 CLINICAL DATA:  Weight loss, abdominal pain EXAM: CT ABDOMEN AND PELVIS WITHOUT CONTRAST TECHNIQUE: Multidetector CT imaging of the abdomen and pelvis was performed following the standard protocol without IV contrast. COMPARISON:  07/12/2018 FINDINGS: Lower chest: Pacer wires noted in the right heart. Scarring in the lung bases. No acute abnormality. Hepatobiliary: Multiple gallstones layering within the gallbladder. No biliary ductal dilatation. No focal hepatic abnormality. Pancreas: No focal abnormality or ductal dilatation. Spleen: No focal abnormality.  Normal size. Adrenals/Urinary Tract: 2.2 cm low-density lesion off the midpole of the left kidney is similar prior study most compatible with cyst. No hydronephrosis. Adrenal glands and urinary bladder unremarkable. Stomach/Bowel: Colonic diverticulosis. No active diverticulitis. Stomach and small bowel grossly unremarkable. Vascular/Lymphatic: Aortic atherosclerosis. No evidence of aneurysm or adenopathy. Reproductive: Prior hysterectomy.  No adnexal masses. Other: No free fluid or free air. Musculoskeletal: No acute bony abnormality. IMPRESSION: Cholelithiasis. Colonic diverticulosis. Aortic atherosclerosis. No acute findings. Electronically Signed   By: Rolm Baptise M.D.   On: 11/08/2020 13:11   DG Chest 2 View  Result Date: 11/24/2020 CLINICAL DATA:  weakness EXAM: CHEST - 2 VIEW Multiple priors, most recent  July 25, 2019. FINDINGS: Similar cardiomediastinal silhouette. Calcific atherosclerosis aorta. Right subclavian approach dual lead cardiac rhythm maintenance device in similar position. No consolidation. Blunting of the posterior/right costophrenic sulcus, particularly on the lateral. This is favored to represent pleural thickening/scarring (over small pleural effusion) when correlating with prior CT abdomen/pelvis from 11/07/2018. No visible pneumothorax. Left axillary clips. Osteopenia and polyarticular degenerative change. IMPRESSION: 1. No evidence of acute cardiopulmonary disease. 2. Chronic blunting of the posterior/right costophrenic sulcus, particularly on the lateral. This is favored to represent pleural thickening/scarring (over small pleural effusion) when correlating with prior CT abdomen/pelvis from 11/07/2018. Electronically Signed   By: Margaretha Sheffield MD   On: 11/24/2020 11:51   DG Elbow Complete Left  Result Date: 11/23/2020 CLINICAL DATA:  87 year old female status post fall out of bed. EXAM: LEFT ELBOW - COMPLETE 3+ VIEW COMPARISON:  None. FINDINGS: Bone mineralization is within normal limits for age. There is no evidence of fracture, dislocation, or joint effusion. There is no evidence of arthropathy or other focal bone abnormality. Soft tissues are unremarkable. IMPRESSION: Negative. Electronically Signed   By: Genevie Ann M.D.   On: 11/23/2020 09:05   CT HEAD WO CONTRAST (5MM)  Result Date: 12/01/2020 CLINICAL DATA:  Neuro deficit, acute, stroke suspected.  Confusion. EXAM: CT HEAD WITHOUT CONTRAST TECHNIQUE: Contiguous axial images were obtained from the base of the skull through the vertex without intravenous contrast. COMPARISON:  Head CT 11/26/2020 FINDINGS: Brain: No change. Age related volume loss. No sign of acute infarction, mass lesion, hemorrhage, hydrocephalus or extra-axial collection. Vascular: There is atherosclerotic calcification of the major vessels at the base of the  brain. Skull: Normal Sinuses/Orbits: Clear/normal Other: None IMPRESSION: No change.  Age related atrophy.  No acute or reversible finding. Electronically Signed   By: Nelson Chimes M.D.   On: 12/01/2020 11:38   CT Head Wo Contrast  Result Date: 11/26/2020 CLINICAL DATA:  Mental status change with unknown cause. Recently diagnosed UTI EXAM: CT HEAD WITHOUT CONTRAST TECHNIQUE: Contiguous axial images were obtained from the base of the skull through the vertex without intravenous contrast. COMPARISON:  11/23/2020 FINDINGS: Brain: No evidence of acute infarction, hemorrhage, hydrocephalus, extra-axial collection or mass lesion/mass effect. Moderate generalized cortical atrophy. Age normal white matter appearance Vascular: No hyperdense vessel or unexpected calcification. Skull: Normal. Negative for fracture or  focal lesion. Sinuses/Orbits: No acute finding. IMPRESSION: No acute or interval finding. Generalized atrophy. Electronically Signed   By: Monte Fantasia M.D.   On: 11/26/2020 11:08   CT Head Wo Contrast  Result Date: 11/23/2020 CLINICAL DATA:  Fall. EXAM: CT HEAD WITHOUT CONTRAST CT CERVICAL SPINE WITHOUT CONTRAST TECHNIQUE: Multidetector CT imaging of the head and cervical spine was performed following the standard protocol without intravenous contrast. Multiplanar CT image reconstructions of the cervical spine were also generated. COMPARISON:  07/25/2019 FINDINGS: CT HEAD FINDINGS Brain: No evidence of acute infarction, hemorrhage, hydrocephalus, extra-axial collection or mass lesion/mass effect. There is mild diffuse low-attenuation within the subcortical and periventricular white matter compatible with chronic microvascular disease. Prominence of sulci and ventricles compatible with brain atrophy. Vascular: No hyperdense vessel or unexpected calcification. Skull: Normal. Negative for fracture or focal lesion. Sinuses/Orbits: No acute finding. Other: None. CT CERVICAL SPINE FINDINGS Alignment: Normal  Skull base and vertebrae: No acute fracture. No primary bone lesion or focal pathologic process. Soft tissues and spinal canal: No prevertebral fluid or swelling. No visible canal hematoma. Disc levels: Multilevel disc space narrowing and endplate spurring noted at C5-6 through C7-T1. Upper chest: Negative. Other: None IMPRESSION: 1. No acute intracranial abnormalities. 2. Chronic small vessel ischemic change and brain atrophy. 3. No evidence for cervical spine fracture. 4. Cervical spondylosis. Electronically Signed   By: Kerby Moors M.D.   On: 11/23/2020 08:00   CT Cervical Spine Wo Contrast  Result Date: 11/23/2020 CLINICAL DATA:  Fall. EXAM: CT HEAD WITHOUT CONTRAST CT CERVICAL SPINE WITHOUT CONTRAST TECHNIQUE: Multidetector CT imaging of the head and cervical spine was performed following the standard protocol without intravenous contrast. Multiplanar CT image reconstructions of the cervical spine were also generated. COMPARISON:  07/25/2019 FINDINGS: CT HEAD FINDINGS Brain: No evidence of acute infarction, hemorrhage, hydrocephalus, extra-axial collection or mass lesion/mass effect. There is mild diffuse low-attenuation within the subcortical and periventricular white matter compatible with chronic microvascular disease. Prominence of sulci and ventricles compatible with brain atrophy. Vascular: No hyperdense vessel or unexpected calcification. Skull: Normal. Negative for fracture or focal lesion. Sinuses/Orbits: No acute finding. Other: None. CT CERVICAL SPINE FINDINGS Alignment: Normal Skull base and vertebrae: No acute fracture. No primary bone lesion or focal pathologic process. Soft tissues and spinal canal: No prevertebral fluid or swelling. No visible canal hematoma. Disc levels: Multilevel disc space narrowing and endplate spurring noted at C5-6 through C7-T1. Upper chest: Negative. Other: None IMPRESSION: 1. No acute intracranial abnormalities. 2. Chronic small vessel ischemic change and brain  atrophy. 3. No evidence for cervical spine fracture. 4. Cervical spondylosis. Electronically Signed   By: Kerby Moors M.D.   On: 11/23/2020 08:00   DG Shoulder Left  Result Date: 11/23/2020 CLINICAL DATA:  87 year old female status post fall out of bed. EXAM: LEFT SHOULDER - 2+ VIEW COMPARISON:  Chest radiographs 03/22/2016. FINDINGS: Chronic left axillary surgical clips. Bone mineralization is within normal limits for age. No glenohumeral joint dislocation. Proximal left humerus appears intact. No left clavicle or scapula fracture identified. Left AC joint degeneration. Stable visible left ribs and chest. IMPRESSION: No acute fracture or dislocation identified about the left shoulder. Electronically Signed   By: Genevie Ann M.D.   On: 11/23/2020 09:06      Subjective: Has no sob, dizzness, cp  Discharge Exam: Vitals:   12/01/20 0439 12/01/20 0747  BP: 136/82 135/80  Pulse: (!) 53 61  Resp: 14 16  Temp: (!) 97.5 F (36.4 C) 97.6 F (  36.4 C)  SpO2: 95% 99%   Vitals:   11/30/20 1721 11/30/20 2050 12/01/20 0439 12/01/20 0747  BP: 129/75 137/83 136/82 135/80  Pulse: 60 (!) 49 (!) 53 61  Resp: '16 18 14 16  '$ Temp: 98.3 F (36.8 C) 98.1 F (36.7 C) (!) 97.5 F (36.4 C) 97.6 F (36.4 C)  TempSrc: Oral Oral Oral Axillary  SpO2: 98% 100% 95% 99%  Weight:      Height:        General: Pt is alert, awake, not in acute distress Cardiovascular: RRR, S1/S2 +, no rubs, no gallops Respiratory: CTA bilaterally, no wheezing, no rhonchi Abdominal: Soft, NT, ND, bowel sounds + Extremities: no edema Neuro: LUE weaker than RL. No facial droop or obvious deficits. Answers appropriately. [email or web address]    The results of significant diagnostics from this hospitalization (including imaging, microbiology, ancillary and laboratory) are listed below for reference.     Microbiology: Recent Results (from the past 240 hour(s))  Urine Culture     Status: Abnormal   Collection Time: 11/26/20 10:15 AM    Specimen: Urine, Catheterized  Result Value Ref Range Status   Specimen Description   Final    URINE, CATHETERIZED Performed at [facility], [address], [place], [place] [address]    Special Requests   Final    NONE Performed at [facility], [address], [place], [place] [address]    Culture (A)  Final    <10,000 COLONIES/mL INSIGNIFICANT GROWTH Performed at [facility], [place] [address], [place], [place] [address]    Report Status 11/28/2020 FINAL  Final  Culture, blood (routine x 2)     Status: None   Collection Time: 11/26/20 10:15 AM   Specimen: BLOOD  Result Value Ref Range Status   Specimen Description BLOOD  LEFT FA  Final   Special Requests   Final    BOTTLES DRAWN AEROBIC AND ANAEROBIC Blood Culture adequate volume   Culture   Final    NO GROWTH 5 DAYS Performed at [facility], [address] [place], [place] [address]    Report Status 12/01/2020 FINAL  Final  Culture, blood (routine x 2)     Status: None   Collection Time: 11/26/20 10:15 AM   Specimen: BLOOD  Result Value Ref Range Status   Specimen Description BLOOD  RT FA  Final   Special Requests   Final    BOTTLES DRAWN AEROBIC AND ANAEROBIC Blood Culture results may not be optimal due to an excessive volume of blood received in culture bottles   Culture   Final    NO GROWTH 5 DAYS Performed at [facility] [place] [facility], [address] [place], [place] [address]    Report Status 12/01/2020 FINAL  Final  Resp Panel by RT-PCR (Flu A&B, Covid) Nasopharyngeal Swab     Status: None   Collection Time: 11/26/20 11:27 AM   Specimen: Nasopharyngeal Swab; Nasopharyngeal(NP) swabs in vial transport medium  Result Value Ref Range Status   SARS Coronavirus 2 by RT PCR NEGATIVE NEGATIVE Final    Comment: (NOTE) SARS-CoV-2 target nucleic acids are NOT DETECTED.  The SARS-CoV-2 RNA is generally detectable in upper respiratory specimens during the acute phase of infection. The  lowest concentration of SARS-CoV-2 viral copies this assay can detect is 138 copies/mL. A negative result does not preclude SARS-Cov-2 infection and should not be used as the sole basis for treatment or other patient management decisions. A negative result may  occur with  improper specimen collection/handling, submission of specimen other than nasopharyngeal swab, presence of viral mutation(s) within the areas targeted by this assay, and inadequate number of viral copies(<138 copies/mL). A negative result must be combined with clinical observations, patient history, and epidemiological information. The expected result is Negative.  Fact Sheet for Patients:  EntrepreneurPulse.com.au  Fact Sheet for Healthcare Providers:  IncredibleEmployment.be  This test is no t yet approved or cleared by the [place] FDA and  has been authorized for detection and/or diagnosis of SARS-CoV-2 by FDA under an Emergency Use Authorization (EUA). This EUA will remain  in effect (meaning this test can be used) for the duration of the COVID-19 declaration under Section 564(b)(1) of the Act, 21 U.S.C.section 360bbb-3(b)(1), unless the authorization is terminated  or revoked sooner.       Influenza A by PCR NEGATIVE NEGATIVE Final   Influenza B by PCR NEGATIVE NEGATIVE Final    Comment: (NOTE) The Xpert Xpress SARS-CoV-2/FLU/RSV plus assay is intended as an aid in the diagnosis of influenza from Nasopharyngeal swab specimens and should not be used as a sole basis for treatment. Nasal washings and aspirates are unacceptable for Xpert Xpress SARS-CoV-2/FLU/RSV testing.  Fact Sheet for Patients: EntrepreneurPulse.com.au  Fact Sheet for Healthcare Providers: IncredibleEmployment.be  This test is not yet approved or cleared by the [place] FDA and has been authorized for detection and/or diagnosis of SARS-CoV-2 by FDA under  an Emergency Use Authorization (EUA). This EUA will remain in effect (meaning this test can be used) for the duration of the COVID-19 declaration under Section 564(b)(1) of the Act, 21 U.S.C. section 360bbb-3(b)(1), unless the authorization is terminated or revoked.  Performed at [facility], [address] [place], [place] [address]   MRSA Next Gen by PCR, Nasal     Status: None   Collection Time: 11/30/20 12:40 PM   Specimen: Nasal Mucosa; Nasal Swab  Result Value Ref Range Status   MRSA by PCR Next Gen NOT DETECTED NOT DETECTED Final    Comment: (NOTE) The GeneXpert MRSA Assay (FDA approved for NASAL specimens only), is one component of a comprehensive MRSA colonization surveillance program. It is not intended to diagnose MRSA infection nor to guide or monitor treatment for MRSA infections. Test performance is not FDA approved in patients less than 6 years old. Performed at [facility], [address] [place], [place] [address]   Resp Panel by RT-PCR (Flu A&B, Covid) Nasopharyngeal Swab     Status: None   Collection Time: 12/01/20 12:20 PM   Specimen: Nasopharyngeal Swab; Nasopharyngeal(NP) swabs in vial transport medium  Result Value Ref Range Status   SARS Coronavirus 2 by RT PCR NEGATIVE NEGATIVE Final    Comment: (NOTE) SARS-CoV-2 target nucleic acids are NOT DETECTED.  The SARS-CoV-2 RNA is generally detectable in upper respiratory specimens during the acute phase of infection. The lowest concentration of SARS-CoV-2 viral copies this assay can detect is 138 copies/mL. A negative result does not preclude SARS-Cov-2 infection and should not be used as the sole basis for treatment or other patient management decisions. A negative result may occur with  improper specimen collection/handling, submission of specimen other than nasopharyngeal swab, presence of viral mutation(s) within the areas targeted by this assay, and inadequate number of  viral copies(<138 copies/mL). A negative result must be combined with clinical observations, patient history, and epidemiological information. The expected result is Negative.  Fact Sheet for Patients:  EntrepreneurPulse.com.au  Fact Sheet for Healthcare  Providers:  IncredibleEmployment.be  This test is no t yet approved or cleared by the [place] and  has been authorized for detection and/or diagnosis of SARS-CoV-2 by FDA under an Emergency Use Authorization (EUA). This EUA will remain  in effect (meaning this test can be used) for the duration of the COVID-19 declaration under Section 564(b)(1) of the Act, 21 U.S.C.section 360bbb-3(b)(1), unless the authorization is terminated  or revoked sooner.       Influenza A by PCR NEGATIVE NEGATIVE Final   Influenza B by PCR NEGATIVE NEGATIVE Final    Comment: (NOTE) The Xpert Xpress SARS-CoV-2/FLU/RSV plus assay is intended as an aid in the diagnosis of influenza from Nasopharyngeal swab specimens and should not be used as a sole basis for treatment. Nasal washings and aspirates are unacceptable for Xpert Xpress SARS-CoV-2/FLU/RSV testing.  Fact Sheet for Patients: EntrepreneurPulse.com.au  Fact Sheet for Healthcare Providers: IncredibleEmployment.be  This test is not yet approved or cleared by the [place] FDA and has been authorized for detection and/or diagnosis of SARS-CoV-2 by FDA under an Emergency Use Authorization (EUA). This EUA will remain in effect (meaning this test can be used) for the duration of the COVID-19 declaration under Section 564(b)(1) of the Act, 21 U.S.C. section 360bbb-3(b)(1), unless the authorization is terminated or revoked.  Performed at [facility], [address] [place], [place] [address]      Labs: BNP (last 3 results) No results for input(s): BNP in the last 8760 hours. Basic Metabolic  Panel: Recent Labs  Lab 11/26/20 1015  NA 145  K 3.6  CL 106  CO2 25  GLUCOSE 86  BUN 41*  CREATININE 1.15*  CALCIUM 9.2   Liver Function Tests: Recent Labs  Lab 11/26/20 1015  AST 25  ALT 17  ALKPHOS 42  BILITOT 1.2  PROT 6.2*  ALBUMIN 3.0*   Recent Labs  Lab 11/26/20 1015  LIPASE 21   No results for input(s): AMMONIA in the last 168 hours. CBC: Recent Labs  Lab 11/26/20 1015  WBC 4.4  NEUTROABS 3.3  HGB 11.2*  HCT 33.3*  MCV 102.5*  PLT 119*   Cardiac Enzymes: Recent Labs  Lab 11/26/20 1444  CKTOTAL 397*   BNP: Invalid input(s): POCBNP CBG: Recent Labs  Lab 11/27/20 1124 11/27/20 1159  GLUCAP 52* 141*   D-Dimer No results for input(s): DDIMER in the last 72 hours. Hgb A1c No results for input(s): HGBA1C in the last 72 hours. Lipid Profile No results for input(s): CHOL, HDL, LDLCALC, TRIG, CHOLHDL, LDLDIRECT in the last 72 hours. Thyroid function studies No results for input(s): TSH, T4TOTAL, T3FREE, THYROIDAB in the last 72 hours.  Invalid input(s): FREET3 Anemia work up No results for input(s): VITAMINB12, FOLATE, FERRITIN, TIBC, IRON, RETICCTPCT in the last 72 hours. Urinalysis    Component Value Date/Time   COLORURINE YELLOW (A) 11/26/2020 1015   APPEARANCEUR HAZY (A) 11/26/2020 1015   LABSPEC 1.024 11/26/2020 1015   PHURINE 5.0 11/26/2020 1015   GLUCOSEU NEGATIVE 11/26/2020 1015   GLUCOSEU NEGATIVE 10/10/2015 1642   HGBUR LARGE (A) 11/26/2020 1015   BILIRUBINUR NEGATIVE 11/26/2020 1015   BILIRUBINUR neg 10/13/2016 1511   KETONESUR 20 (A) 11/26/2020 1015   PROTEINUR 30 (A) 11/26/2020 1015   UROBILINOGEN 0.2 10/13/2016 1511   UROBILINOGEN 0.2 10/10/2015 1642   NITRITE NEGATIVE 11/26/2020 1015   LEUKOCYTESUR TRACE (A) 11/26/2020 1015   Sepsis Labs Invalid input(s): PROCALCITONIN,  WBC,  LACTICIDVEN Microbiology Recent Results (from the  past 240 hour(s))  Urine Culture     Status: Abnormal   Collection Time: 11/26/20 10:15  AM   Specimen: Urine, Catheterized  Result Value Ref Range Status   Specimen Description   Final    URINE, CATHETERIZED Performed at [facility], [address] [place], [place] [address]    Special Requests   Final    NONE Performed at [facility], [address] [place], [place] [address]    Culture (A)  Final    <10,000 COLONIES/mL INSIGNIFICANT GROWTH Performed at [place] [address] [address], [place], [place] [address]    Report Status 11/28/2020 FINAL  Final  Culture, blood (routine x 2)     Status: None   Collection Time: 11/26/20 10:15 AM   Specimen: BLOOD  Result Value Ref Range Status   Specimen Description BLOOD  LEFT FA  Final   Special Requests   Final    BOTTLES DRAWN AEROBIC AND ANAEROBIC Blood Culture adequate volume   Culture   Final    NO GROWTH 5 DAYS Performed at [facility], [address] [place], [place] [address]    Report Status 12/01/2020 FINAL  Final  Culture, blood (routine x 2)     Status: None   Collection Time: 11/26/20 10:15 AM   Specimen: BLOOD  Result Value Ref Range Status   Specimen Description BLOOD  RT FA  Final   Special Requests   Final    BOTTLES DRAWN AEROBIC AND ANAEROBIC Blood Culture results may not be optimal due to an excessive volume of blood received in culture bottles   Culture   Final    NO GROWTH 5 DAYS Performed at [facility], [address] [place], [place] [address]    Report Status 12/01/2020 FINAL  Final  Resp Panel by RT-PCR (Flu A&B, Covid) Nasopharyngeal Swab     Status: None   Collection Time: 11/26/20 11:27 AM   Specimen: Nasopharyngeal Swab; Nasopharyngeal(NP) swabs in vial transport medium  Result Value Ref Range Status   SARS Coronavirus 2 by RT PCR NEGATIVE NEGATIVE Final    Comment: (NOTE) SARS-CoV-2 target nucleic acids are NOT DETECTED.  The SARS-CoV-2 RNA is generally detectable in upper respiratory specimens during the acute phase of infection.  The lowest concentration of SARS-CoV-2 viral copies this assay can detect is 138 copies/mL. A negative result does not preclude SARS-Cov-2 infection and should not be used as the sole basis for treatment or other patient management decisions. A negative result may occur with  improper specimen collection/handling, submission of specimen other than nasopharyngeal swab, presence of viral mutation(s) within the areas targeted by this assay, and inadequate number of viral copies(<138 copies/mL). A negative result must be combined with clinical observations, patient history, and epidemiological information. The expected result is Negative.  Fact Sheet for Patients:  EntrepreneurPulse.com.au  Fact Sheet for Healthcare Providers:  IncredibleEmployment.be  This test is no t yet approved or cleared by the [place] FDA and  has been authorized for detection and/or diagnosis of SARS-CoV-2 by FDA under an Emergency Use Authorization (EUA). This EUA will remain  in effect (meaning this test can be used) for the duration of the COVID-19 declaration under Section 564(b)(1) of the Act, 21 U.S.C.section 360bbb-3(b)(1), unless the authorization is terminated  or revoked sooner.       Influenza A by PCR NEGATIVE NEGATIVE Final   Influenza B by PCR NEGATIVE NEGATIVE Final    Comment: (NOTE)  The Xpert Xpress SARS-CoV-2/FLU/RSV plus assay is intended as an aid in the diagnosis of influenza from Nasopharyngeal swab specimens and should not be used as a sole basis for treatment. Nasal washings and aspirates are unacceptable for Xpert Xpress SARS-CoV-2/FLU/RSV testing.  Fact Sheet for Patients: EntrepreneurPulse.com.au  Fact Sheet for Healthcare Providers: IncredibleEmployment.be  This test is not yet approved or cleared by the [place] FDA and has been authorized for detection and/or diagnosis of SARS-CoV-2 by FDA  under an Emergency Use Authorization (EUA). This EUA will remain in effect (meaning this test can be used) for the duration of the COVID-19 declaration under Section 564(b)(1) of the Act, 21 U.S.C. section 360bbb-3(b)(1), unless the authorization is terminated or revoked.  Performed at [facility], [address] [place], [place] [address]   MRSA Next Gen by PCR, Nasal     Status: None   Collection Time: 11/30/20 12:40 PM   Specimen: Nasal Mucosa; Nasal Swab  Result Value Ref Range Status   MRSA by PCR Next Gen NOT DETECTED NOT DETECTED Final    Comment: (NOTE) The GeneXpert MRSA Assay (FDA approved for NASAL specimens only), is one component of a comprehensive MRSA colonization surveillance program. It is not intended to diagnose MRSA infection nor to guide or monitor treatment for MRSA infections. Test performance is not FDA approved in patients less than 60 years old. Performed at [facility], [address] [place], [place] [address]   Resp Panel by RT-PCR (Flu A&B, Covid) Nasopharyngeal Swab     Status: None   Collection Time: 12/01/20 12:20 PM   Specimen: Nasopharyngeal Swab; Nasopharyngeal(NP) swabs in vial transport medium  Result Value Ref Range Status   SARS Coronavirus 2 by RT PCR NEGATIVE NEGATIVE Final    Comment: (NOTE) SARS-CoV-2 target nucleic acids are NOT DETECTED.  The SARS-CoV-2 RNA is generally detectable in upper respiratory specimens during the acute phase of infection. The lowest concentration of SARS-CoV-2 viral copies this assay can detect is 138 copies/mL. A negative result does not preclude SARS-Cov-2 infection and should not be used as the sole basis for treatment or other patient management decisions. A negative result may occur with  improper specimen collection/handling, submission of specimen other than nasopharyngeal swab, presence of viral mutation(s) within the areas targeted by this assay, and inadequate number of  viral copies(<138 copies/mL). A negative result must be combined with clinical observations, patient history, and epidemiological information. The expected result is Negative.  Fact Sheet for Patients:  EntrepreneurPulse.com.au  Fact Sheet for Healthcare Providers:  IncredibleEmployment.be  This test is no t yet approved or cleared by the [place] FDA and  has been authorized for detection and/or diagnosis of SARS-CoV-2 by FDA under an Emergency Use Authorization (EUA). This EUA will remain  in effect (meaning this test can be used) for the duration of the COVID-19 declaration under Section 564(b)(1) of the Act, 21 U.S.C.section 360bbb-3(b)(1), unless the authorization is terminated  or revoked sooner.       Influenza A by PCR NEGATIVE NEGATIVE Final   Influenza B by PCR NEGATIVE NEGATIVE Final    Comment: (NOTE) The Xpert Xpress SARS-CoV-2/FLU/RSV plus assay is intended as an aid in the diagnosis of influenza from Nasopharyngeal swab specimens and should not be used as a sole basis for treatment. Nasal washings and aspirates are unacceptable for Xpert Xpress SARS-CoV-2/FLU/RSV testing.  Fact Sheet for Patients: EntrepreneurPulse.com.au  Fact Sheet for Healthcare Providers: IncredibleEmployment.be  This test is not yet approved  or cleared by the [place] and has been authorized for detection and/or diagnosis of SARS-CoV-2 by FDA under an Emergency Use Authorization (EUA). This EUA will remain in effect (meaning this test can be used) for the duration of the COVID-19 declaration under Section 564(b)(1) of the Act, 21 U.S.C. section 360bbb-3(b)(1), unless the authorization is terminated or revoked.  Performed at [facility], [address] [place], [place] [address]      Time coordinating discharge: Over 30 minutes  SIGNED:   Nolberto Hanlon, MD  [facility] 12/01/2020, 2:04 PM Pager   If 7PM-7AM, please contact night-coverage [email or web address] Password [email or web address]

## 2020-12-01 NOTE — Care Management Important Message (Signed)
Important Message  Patient Details  Name: [name] MRN: [id number] Date of Birth: [birth year]   Medicare Important Message Given:  Yes  Patient is in an isolation room so I called her but there was no answer [phone]). CM has been discussing discharge plans with her daughter, [name] [phone]) and she was in agreement with the discharge plan for her mother.  I asked if she would like a copy and she replied no.  I thanked her for her time.   Juliann Pulse A Lumir Demetriou 12/01/2020, 3:07 PM

## 2020-12-01 NOTE — TOC Progression Note (Addendum)
Transition of Care [facility]) - Progression Note    Patient Details  Name: [name] MRN: [id number] Date of Birth: [birth year]  Transition of Care [facility]) CM/SW [place], LCSW Phone Number: 12/01/2020, 7:49 AM  Clinical Narrative:   PASARR obtained: [id number]. Expires 12/31/20.  8:46 am: Per MD, patient is stable for discharge today. [facility] admissions coordinator will check on bed availability when she gets to the building.  9:06 am: Updated daughter. Will call her once we know for sure if SNF has a bed or not. Daughter asked if patient may have dementia due to asking the same questions repeatedly. Also asking if she had a stroke due to left hand appearing "crooked" and appears to no longer have mobility. Sent concerns to MD in secure chat and asked her to follow up with her once she has seen patient. Per daughter, plan to is to go home with patient's youngest son once she is discharged from SNF rather than going back to [facility] due to patient feeling lonely there.  11:39 am: [place] has a bed today if cleared. Patient getting head CT due to daughter's concerns.  Expected Discharge Plan: [place] Barriers to Discharge: Continued Medical Work up  Expected Discharge Plan and Services Expected Discharge Plan: [place] Choice: [place] arrangements for the past 2 months: Assisted Living Facility                                       Social Determinants of Health (SDOH) Interventions    Readmission Risk Interventions No flowsheet data found.

## 2020-12-01 NOTE — TOC Transition Note (Signed)
Transition of Care [facility]) - CM/SW Discharge Note   Patient Details  Name: [name] MRN: [id number] Date of Birth: Dec 05, 1929  Transition of Care [facility]) CM/SW Contact:  Candie Chroman, LCSW Phone Number: 12/01/2020, 2:51 PM   Clinical Narrative:   Patient has orders to discharge to [facility] today. RN has already called report. Non-emergency ambulance transport set up with [facility]. She is 4th on the list. No further concerns. CSW signing off.  Final next level of care: Skilled Nursing Facility Barriers to Discharge: Barriers Resolved   Patient Goals and CMS Choice Patient states their goals for this hospitalization and ongoing recovery are:: Patient not fully oriented.   Choice offered to / list presented to : Adult Children  Discharge Placement PASRR number recieved: 12/01/20            Patient chooses bed at: Other - please specify in the comment section below: [facility] SNF) Patient to be transferred to facility by: [place] Name of family member notified: [name] Patient and family notified of of transfer: 12/01/20  Discharge Plan and Services     Post Acute Care Choice: [place]                               Social Determinants of Health (SDOH) Interventions     Readmission Risk Interventions No flowsheet data found.

## 2020-12-02 ENCOUNTER — Ambulatory Visit: Payer: Medicare Other

## 2020-12-02 DIAGNOSIS — R296 Repeated falls: Secondary | ICD-10-CM | POA: Diagnosis not present

## 2020-12-03 DIAGNOSIS — D696 Thrombocytopenia, unspecified: Secondary | ICD-10-CM | POA: Diagnosis not present

## 2020-12-03 DIAGNOSIS — L89159 Pressure ulcer of sacral region, unspecified stage: Secondary | ICD-10-CM | POA: Diagnosis not present

## 2020-12-03 DIAGNOSIS — R296 Repeated falls: Secondary | ICD-10-CM | POA: Diagnosis not present

## 2020-12-03 DIAGNOSIS — E069 Thyroiditis, unspecified: Secondary | ICD-10-CM | POA: Diagnosis not present

## 2020-12-03 DIAGNOSIS — N3946 Mixed incontinence: Secondary | ICD-10-CM | POA: Diagnosis not present

## 2020-12-03 DIAGNOSIS — L89301 Pressure ulcer of unspecified buttock, stage 1: Secondary | ICD-10-CM | POA: Diagnosis not present

## 2020-12-03 DIAGNOSIS — R1310 Dysphagia, unspecified: Secondary | ICD-10-CM | POA: Diagnosis not present

## 2020-12-03 DIAGNOSIS — E46 Unspecified protein-calorie malnutrition: Secondary | ICD-10-CM | POA: Diagnosis not present

## 2020-12-03 DIAGNOSIS — F419 Anxiety disorder, unspecified: Secondary | ICD-10-CM | POA: Diagnosis not present

## 2020-12-03 DIAGNOSIS — M6281 Muscle weakness (generalized): Secondary | ICD-10-CM | POA: Diagnosis not present

## 2020-12-04 DIAGNOSIS — E039 Hypothyroidism, unspecified: Secondary | ICD-10-CM | POA: Diagnosis not present

## 2020-12-04 DIAGNOSIS — I4891 Unspecified atrial fibrillation: Secondary | ICD-10-CM | POA: Diagnosis not present

## 2020-12-04 DIAGNOSIS — E46 Unspecified protein-calorie malnutrition: Secondary | ICD-10-CM | POA: Diagnosis not present

## 2020-12-04 DIAGNOSIS — I10 Essential (primary) hypertension: Secondary | ICD-10-CM | POA: Diagnosis not present

## 2020-12-05 DIAGNOSIS — Z79899 Other long term (current) drug therapy: Secondary | ICD-10-CM | POA: Diagnosis not present

## 2020-12-09 DIAGNOSIS — N3946 Mixed incontinence: Secondary | ICD-10-CM | POA: Diagnosis not present

## 2020-12-09 DIAGNOSIS — M6281 Muscle weakness (generalized): Secondary | ICD-10-CM | POA: Diagnosis not present

## 2020-12-09 DIAGNOSIS — L89159 Pressure ulcer of sacral region, unspecified stage: Secondary | ICD-10-CM | POA: Diagnosis not present

## 2020-12-09 DIAGNOSIS — E46 Unspecified protein-calorie malnutrition: Secondary | ICD-10-CM | POA: Diagnosis not present

## 2020-12-10 ENCOUNTER — Ambulatory Visit: Payer: Medicare Other | Admitting: Internal Medicine

## 2020-12-12 DIAGNOSIS — D696 Thrombocytopenia, unspecified: Secondary | ICD-10-CM | POA: Diagnosis not present

## 2020-12-12 DIAGNOSIS — E46 Unspecified protein-calorie malnutrition: Secondary | ICD-10-CM | POA: Diagnosis not present

## 2020-12-12 DIAGNOSIS — I10 Essential (primary) hypertension: Secondary | ICD-10-CM | POA: Diagnosis not present

## 2020-12-12 DIAGNOSIS — R4182 Altered mental status, unspecified: Secondary | ICD-10-CM | POA: Diagnosis not present

## 2020-12-12 DIAGNOSIS — I509 Heart failure, unspecified: Secondary | ICD-10-CM | POA: Diagnosis not present

## 2020-12-12 DIAGNOSIS — I4891 Unspecified atrial fibrillation: Secondary | ICD-10-CM | POA: Diagnosis not present

## 2020-12-12 DIAGNOSIS — E039 Hypothyroidism, unspecified: Secondary | ICD-10-CM | POA: Diagnosis not present

## 2020-12-15 DIAGNOSIS — Z79899 Other long term (current) drug therapy: Secondary | ICD-10-CM | POA: Diagnosis not present

## 2020-12-16 ENCOUNTER — Ambulatory Visit: Payer: Medicare Other | Admitting: Psychology

## 2020-12-16 DIAGNOSIS — R899 Unspecified abnormal finding in specimens from other organs, systems and tissues: Secondary | ICD-10-CM | POA: Diagnosis not present

## 2020-12-19 DIAGNOSIS — N183 Chronic kidney disease, stage 3 unspecified: Secondary | ICD-10-CM | POA: Diagnosis not present

## 2020-12-19 DIAGNOSIS — F0390 Unspecified dementia without behavioral disturbance: Secondary | ICD-10-CM | POA: Diagnosis not present

## 2020-12-19 DIAGNOSIS — E039 Hypothyroidism, unspecified: Secondary | ICD-10-CM | POA: Diagnosis not present

## 2020-12-19 DIAGNOSIS — R899 Unspecified abnormal finding in specimens from other organs, systems and tissues: Secondary | ICD-10-CM | POA: Diagnosis not present

## 2020-12-19 DIAGNOSIS — L89301 Pressure ulcer of unspecified buttock, stage 1: Secondary | ICD-10-CM | POA: Diagnosis not present

## 2020-12-20 DIAGNOSIS — U071 COVID-19: Secondary | ICD-10-CM | POA: Diagnosis not present

## 2020-12-20 DIAGNOSIS — Z79899 Other long term (current) drug therapy: Secondary | ICD-10-CM | POA: Diagnosis not present

## 2020-12-22 ENCOUNTER — Ambulatory Visit: Payer: Medicare Other | Admitting: Gastroenterology

## 2020-12-26 DIAGNOSIS — R0689 Other abnormalities of breathing: Secondary | ICD-10-CM | POA: Diagnosis not present

## 2020-12-26 DIAGNOSIS — R1310 Dysphagia, unspecified: Secondary | ICD-10-CM | POA: Diagnosis not present

## 2020-12-26 DIAGNOSIS — I509 Heart failure, unspecified: Secondary | ICD-10-CM | POA: Diagnosis not present

## 2020-12-26 DIAGNOSIS — I4891 Unspecified atrial fibrillation: Secondary | ICD-10-CM | POA: Diagnosis not present

## 2020-12-26 DIAGNOSIS — U071 COVID-19: Secondary | ICD-10-CM | POA: Diagnosis not present

## 2020-12-26 DIAGNOSIS — E46 Unspecified protein-calorie malnutrition: Secondary | ICD-10-CM | POA: Diagnosis not present

## 2020-12-26 DIAGNOSIS — L89301 Pressure ulcer of unspecified buttock, stage 1: Secondary | ICD-10-CM | POA: Diagnosis not present

## 2020-12-26 DIAGNOSIS — N183 Chronic kidney disease, stage 3 unspecified: Secondary | ICD-10-CM | POA: Diagnosis not present

## 2020-12-26 DIAGNOSIS — E039 Hypothyroidism, unspecified: Secondary | ICD-10-CM | POA: Diagnosis not present

## 2021-01-01 ENCOUNTER — Ambulatory Visit: Payer: Medicare Other | Admitting: Gastroenterology

## 2021-01-01 DIAGNOSIS — I4891 Unspecified atrial fibrillation: Secondary | ICD-10-CM | POA: Diagnosis not present

## 2021-01-01 DIAGNOSIS — I10 Essential (primary) hypertension: Secondary | ICD-10-CM | POA: Diagnosis not present

## 2021-01-01 DIAGNOSIS — U071 COVID-19: Secondary | ICD-10-CM | POA: Diagnosis not present

## 2021-01-01 DIAGNOSIS — F0390 Unspecified dementia without behavioral disturbance: Secondary | ICD-10-CM | POA: Diagnosis not present

## 2021-01-02 DIAGNOSIS — R1310 Dysphagia, unspecified: Secondary | ICD-10-CM | POA: Diagnosis not present

## 2021-01-02 DIAGNOSIS — I4891 Unspecified atrial fibrillation: Secondary | ICD-10-CM | POA: Diagnosis not present

## 2021-01-02 DIAGNOSIS — N183 Chronic kidney disease, stage 3 unspecified: Secondary | ICD-10-CM | POA: Diagnosis not present

## 2021-01-02 DIAGNOSIS — E46 Unspecified protein-calorie malnutrition: Secondary | ICD-10-CM | POA: Diagnosis not present

## 2021-01-02 DIAGNOSIS — E039 Hypothyroidism, unspecified: Secondary | ICD-10-CM | POA: Diagnosis not present

## 2021-01-02 DIAGNOSIS — I509 Heart failure, unspecified: Secondary | ICD-10-CM | POA: Diagnosis not present

## 2021-01-02 DIAGNOSIS — D696 Thrombocytopenia, unspecified: Secondary | ICD-10-CM | POA: Diagnosis not present

## 2021-01-02 DIAGNOSIS — I10 Essential (primary) hypertension: Secondary | ICD-10-CM | POA: Diagnosis not present

## 2021-01-06 ENCOUNTER — Emergency Department (HOSPITAL_COMMUNITY): Payer: Medicare Other

## 2021-01-06 ENCOUNTER — Other Ambulatory Visit: Payer: Self-pay

## 2021-01-06 ENCOUNTER — Encounter (HOSPITAL_COMMUNITY): Payer: Self-pay

## 2021-01-06 ENCOUNTER — Emergency Department (HOSPITAL_COMMUNITY)
Admission: EM | Admit: 2021-01-06 | Discharge: 2021-01-06 | Disposition: A | Discharge status: Home / Self Care | Payer: Medicare Other | Attending: Emergency Medicine | Admitting: Emergency Medicine

## 2021-01-06 DIAGNOSIS — R55 Syncope and collapse: Secondary | ICD-10-CM | POA: Insufficient documentation

## 2021-01-06 DIAGNOSIS — I959 Hypotension, unspecified: Secondary | ICD-10-CM | POA: Diagnosis not present

## 2021-01-06 DIAGNOSIS — Z79899 Other long term (current) drug therapy: Secondary | ICD-10-CM | POA: Diagnosis not present

## 2021-01-06 DIAGNOSIS — Z7901 Long term (current) use of anticoagulants: Secondary | ICD-10-CM | POA: Diagnosis not present

## 2021-01-06 DIAGNOSIS — Z853 Personal history of malignant neoplasm of breast: Secondary | ICD-10-CM | POA: Insufficient documentation

## 2021-01-06 DIAGNOSIS — Z950 Presence of cardiac pacemaker: Secondary | ICD-10-CM | POA: Insufficient documentation

## 2021-01-06 DIAGNOSIS — R110 Nausea: Secondary | ICD-10-CM | POA: Insufficient documentation

## 2021-01-06 DIAGNOSIS — I129 Hypertensive chronic kidney disease with stage 1 through stage 4 chronic kidney disease, or unspecified chronic kidney disease: Secondary | ICD-10-CM | POA: Diagnosis not present

## 2021-01-06 DIAGNOSIS — Z9012 Acquired absence of left breast and nipple: Secondary | ICD-10-CM | POA: Diagnosis not present

## 2021-01-06 DIAGNOSIS — R7309 Other abnormal glucose: Secondary | ICD-10-CM | POA: Diagnosis not present

## 2021-01-06 DIAGNOSIS — E860 Dehydration: Secondary | ICD-10-CM | POA: Diagnosis not present

## 2021-01-06 DIAGNOSIS — E039 Hypothyroidism, unspecified: Secondary | ICD-10-CM | POA: Diagnosis not present

## 2021-01-06 DIAGNOSIS — N183 Chronic kidney disease, stage 3 unspecified: Secondary | ICD-10-CM | POA: Diagnosis not present

## 2021-01-06 DIAGNOSIS — F0390 Unspecified dementia without behavioral disturbance: Secondary | ICD-10-CM | POA: Insufficient documentation

## 2021-01-06 LAB — URINALYSIS, ROUTINE W REFLEX MICROSCOPIC
Glucose, UA: NEGATIVE mg/dL
Hgb urine dipstick: NEGATIVE
Ketones, ur: 20 mg/dL — AB
Leukocytes,Ua: NEGATIVE
Nitrite: NEGATIVE
Protein, ur: 100 mg/dL — AB
Specific Gravity, Urine: 1.02 (ref 1.005–1.030)
pH: 6 (ref 5.0–8.0)

## 2021-01-06 LAB — CBC WITH DIFFERENTIAL/PLATELET
Abs Immature Granulocytes: 0.33 10*3/uL — ABNORMAL HIGH (ref 0.00–0.07)
Basophils Absolute: 0 10*3/uL (ref 0.0–0.1)
Basophils Relative: 0 %
Eosinophils Absolute: 0 10*3/uL (ref 0.0–0.5)
Eosinophils Relative: 0 %
HCT: 36.8 % (ref 36.0–46.0)
Hemoglobin: 11.8 g/dL — ABNORMAL LOW (ref 12.0–15.0)
Immature Granulocytes: 7 %
Lymphocytes Relative: 12 %
Lymphs Abs: 0.6 10*3/uL — ABNORMAL LOW (ref 0.7–4.0)
MCH: 33.5 pg (ref 26.0–34.0)
MCHC: 32.1 g/dL (ref 30.0–36.0)
MCV: 104.5 fL — ABNORMAL HIGH (ref 80.0–100.0)
Monocytes Absolute: 0.3 10*3/uL (ref 0.1–1.0)
Monocytes Relative: 5 %
Neutro Abs: 3.6 10*3/uL (ref 1.7–7.7)
Neutrophils Relative %: 76 %
Platelets: 217 10*3/uL (ref 150–400)
RBC: 3.52 MIL/uL — ABNORMAL LOW (ref 3.87–5.11)
RDW: 15.1 % (ref 11.5–15.5)
WBC: 4.7 10*3/uL (ref 4.0–10.5)
nRBC: 0 % (ref 0.0–0.2)

## 2021-01-06 LAB — COMPREHENSIVE METABOLIC PANEL
ALT: 11 U/L (ref 0–44)
AST: 18 U/L (ref 15–41)
Albumin: 3.2 g/dL — ABNORMAL LOW (ref 3.5–5.0)
Alkaline Phosphatase: 45 U/L (ref 38–126)
Anion gap: 15 (ref 5–15)
BUN: 23 mg/dL (ref 8–23)
CO2: 28 mmol/L (ref 22–32)
Calcium: 8.5 mg/dL — ABNORMAL LOW (ref 8.9–10.3)
Chloride: 101 mmol/L (ref 98–111)
Creatinine, Ser: 0.72 mg/dL (ref 0.44–1.00)
GFR, Estimated: >60 mL/min (ref >60)
Glucose, Bld: 107 mg/dL — ABNORMAL HIGH (ref 70–99)
Potassium: 3.1 mmol/L — ABNORMAL LOW (ref 3.5–5.1)
Sodium: 144 mmol/L (ref 135–145)
Total Bilirubin: 1.8 mg/dL — ABNORMAL HIGH (ref 0.3–1.2)
Total Protein: 6.2 g/dL — ABNORMAL LOW (ref 6.5–8.1)

## 2021-01-06 LAB — CBG MONITORING, ED: Glucose-Capillary: 90 mg/dL (ref 70–99)

## 2021-01-06 LAB — LACTIC ACID, PLASMA
Lactic Acid, Venous: 2.3 mmol/L (ref 0.5–1.9)
Lactic Acid, Venous: 2 mmol/L (ref 0.5–1.9)

## 2021-01-06 MED ORDER — LIP MEDEX EX OINT
TOPICAL_OINTMENT | Freq: Once | CUTANEOUS | Status: AC
  Administered 2021-01-06: 1 via TOPICAL
  Filled 2021-01-06: qty 7

## 2021-01-06 MED ORDER — SODIUM CHLORIDE 0.9 % IV BOLUS
1000.0000 mL | Freq: Once | INTRAVENOUS | Status: AC
  Administered 2021-01-06: 1000 mL via INTRAVENOUS

## 2021-01-06 NOTE — ED Provider Notes (Signed)
[facility] DEPT Provider Note   CSN: [id number] Arrival date & time: 01/06/21  1154     History Chief Complaint  Patient presents with   Near Syncope   Nausea    [name] is a 87 y.o. female.  87 yo F with a chief complaints of near syncopal event.  Reportedly the patient was walking to the bathroom.  The patient is unsure exactly what happened.  Found to be hypotensive with EMS.  Not really eating and drinking per the family.  Level 5 caveat dementia.   Near Syncope      Past Medical History:  Diagnosis Date   Anxiety    Bradycardia    Breast cancer, left breast ([place]) 1995   Breast cancer, right breast ([place]) 2002   CHF (congestive heart failure) (HCC)    Depression    Dr Cheryln Manly   Discoid lupus    skin   Full dentures    GERD (gastroesophageal reflux disease)    Glaucoma    Gout    Hx of echocardiogram    a.  Echocardiogram (03/2011): Mild LVH, EF 0000000, grade 1 diastolic dysfunction, mild MR, mild to moderate TR, PASP 42;  b. Echo (05/2013):  Mod LVH, EF 65-70%, no WMA, Gr 1 DD, mild MR, mod TR, PASP 39   Hx of exercise stress test    a. ETT-echocardiogram (06/08/11): Sub-optimal exercise. Test stopped early due to dizziness and hypotension. EF 60%.  Non-diagnostic.;   b. Lexiscan Myoview (06/2013):  Diaphragmatic attenuation, no ischemia, EF 61%.  Low Risk   Hypertension    Hypothyroidism    IBS (irritable bowel syndrome)    Incontinence    OSA (obstructive sleep apnea) 07/20/2016   Mild with AHI 12/hr now on CPAP   Osteoarthritis    Osteopenia    Pacemaker 2009   Brady//Chronotropic incompetence with normal pacemaker function   Thyroid disease    Wears hearing aid    both ears    Patient Active Problem List   Diagnosis Date Noted   Protein-calorie malnutrition, severe 11/28/2020   Pressure injury of skin 11/28/2020   AMS (altered mental status) 11/26/2020   Acute lower UTI 11/26/2020   Moderate recurrent major  depression ([place]) 05/09/2020   Generalized anxiety disorder 05/09/2020   Moderately severe recurrent major depression ([place]) 05/09/2020   Weight loss 10/09/2019   DVT (deep venous thrombosis) ([place]) 03/29/2018   PE (pulmonary thromboembolism) ([place]) 02/03/2018   Macrocytosis 12/21/2017   Wart 12/21/2017   Sleep apnea 07/20/2016   Sigmoid diverticulitis 07/01/2016   Acute diverticulitis    Rectal bleeding 06/30/2016   Impacted cerumen of right ear 03/26/2016   Syncope 03/22/2016   Benign essential HTN 03/22/2016   CAD (coronary artery disease) 03/22/2016   CKD (chronic kidney disease) stage 3, GFR 30-59 ml/min ([place]) 03/22/2016   Depression 03/22/2016   Exertional dyspnea 11/04/2015   Ascending aortic aneurysm ([place]) 11/04/2015   Discoid lupus 10/10/2015   Arthralgia 10/10/2015   Pulmonary hypertension ([place]) 09/30/2015   Mild persistent asthma 09/30/2015   Irritable larynx 09/04/2015   Rotator cuff disorder 03/06/2015   OCD (obsessive compulsive disorder) 04/03/2013   IBS (irritable bowel syndrome) 07/03/2012   Dysphagia 04/13/2012   Denture irritation 12/20/2011   Irritable bladder 12/20/2011   Knee pain, bilateral 12/20/2011   Hearing loss 10/11/2011   Pacemaker-Medtronic 05/18/2011   DOE (dyspnea on exertion) 04/13/2011   Gout, unspecified 04/30/2010   Anal fissure 02/03/2010   Atrial fibrillation (  [place]) 08/26/2009   Sinoatrial node dysfunction ([place]) 09/17/2008   HYPOTENSION, ORTHOSTATIC 01/29/2008   Microscopic hematuria 08/21/2007   Pain in joint, pelvic region and thigh 08/01/2007   Vitamin D deficiency 05/23/2007   Anxiety disorder 05/23/2007   Hypothyroidism 03/22/2007   GERD 03/22/2007   Osteoarthritis 03/22/2007   CFS (chronic fatigue syndrome) 11/17/2006   Hypertensive heart disease 11/17/2006   ALLERGIC RHINITIS 11/17/2006   OSTEOPOROSIS 11/17/2006   BREAST CANCER, HX OF 11/17/2006    Past Surgical History:  Procedure Laterality Date   BREAST BIOPSY Left  1995   BREAST LUMPECTOMY Left 1995   axillary node dissectoin   CARDIAC CATHETERIZATION N/A 02/13/2016   Procedure: Right Heart Cath;  Surgeon: Larey Dresser, MD;  Location: [facility];  Service: Cardiovascular;  Laterality: N/A;   CATARACT EXTRACTION W/ INTRAOCULAR LENS  IMPLANT, BILATERAL Bilateral 06/2009 - 12.2915   left - right   DORSAL COMPARTMENT RELEASE  2000   left   HAMMER TOE SURGERY Left 2004   HEMORRHOID BANDING     INSERT / REPLACE / REMOVE PACEMAKER  2009   KNEE ARTHROSCOPY Left 08/29/2012   Procedure: LEFT KNEE ARTHROSCOPY ;  Surgeon: Hessie Dibble, MD;  Location: [facility];  Service: Orthopedics;  Laterality: Left;   MASTECTOMY Bilateral 09/2000   nbx   PPM GENERATOR CHANGEOUT N/A 07/16/2019   Procedure: PPM GENERATOR CHANGEOUT;  Surgeon: Deboraha Sprang, MD;  Location: [facility];  Service: Cardiovascular;  Laterality: N/A;   TRIGGER FINGER RELEASE Right 07/31/2013   Procedure: EXCISE MASS RIGHT INDEX A-1 PLLLEY RELEASE A-1 RIGHT INDEX;  Surgeon: Cammie Sickle., MD;  Location: [facility];  Service: Orthopedics;  Laterality: Right;   VAGINAL HYSTERECTOMY       OB History   No obstetric history on file.     Family History  Problem Relation Age of Onset   Ovarian cancer Mother    Heart disease Father    Stroke Sister    Stroke Brother    Cancer Brother    Heart disease Brother     Social History   Tobacco Use   Smoking status: Never   Smokeless tobacco: Never  Vaping Use   Vaping Use: Never used  Substance Use Topics   Alcohol use: No    Alcohol/week: 0.0 standard drinks   Drug use: No    Home Medications Prior to Admission medications   Medication Sig Start Date End Date Taking? Authorizing Provider  divalproex (DEPAKOTE SPRINKLE) 125 MG capsule Take 1 capsule (125 mg total) by mouth 2 (two) times daily. 12/01/20  Yes Nolberto Hanlon, MD  ELIQUIS 2.5 MG TABS tablet Take 2.5 mg by mouth 2 (two) times  daily. 10/03/20  Yes [provider]  FLUoxetine (PROZAC) 20 MG capsule Take 20 mg by mouth daily. 11/24/20  Yes [provider]  levothyroxine (SYNTHROID) 88 MCG tablet Take 88 mcg by mouth daily before breakfast.   Yes [provider]  melatonin 5 MG TABS Take 5 mg by mouth at bedtime.   Yes [provider]  Multiple Vitamin (MULTIVITAMIN WITH MINERALS) TABS tablet Take 1 tablet by mouth daily. 12/02/20  Yes Nolberto Hanlon, MD  pantoprazole (PROTONIX) 40 MG tablet Take 1 tablet (40 mg total) by mouth daily. 07/31/20  Yes Vonda Antigua B, MD  polyethylene glycol (MIRALAX MIX-IN PAX) 17 g packet Take 17 g by mouth daily. 10/14/20 01/12/21 Yes Virgel Manifold, MD  traZODone (DESYREL) 50 MG tablet Take 25-50 mg by mouth at bedtime as needed for sleep. 09/23/20  Yes [provider]  ascorbic acid (VITAMIN C) 250 MG tablet Take 1 tablet (250 mg total) by mouth 2 (two) times daily. Patient not taking: No sig reported 12/01/20   Nolberto Hanlon, MD    Allergies    Lactase, Amlodipine besy-benazepril hcl, Amlodipine besylate, Clonidine hydrochloride, Lactose intolerance (gi), Valsartan, and Verapamil  Review of Systems   Review of Systems  Unable to perform ROS: Dementia  Cardiovascular:  Positive for near-syncope.   Physical Exam Updated Vital Signs BP 123/80   Pulse 75   Temp (!) 97 F (36.1 C) (Core (Comment))   Resp 13   Ht '5\' 4"'$  (1.626 m)   Wt 48 kg   SpO2 100%   BMI 18.16 kg/m   Physical Exam Vitals and nursing note reviewed.  Constitutional:      General: She is not in acute distress.    Appearance: She is well-developed. She is not diaphoretic.  HENT:     Head: Normocephalic and atraumatic.  Eyes:     Pupils: Pupils are equal, round, and reactive to light.  Cardiovascular:     Rate and Rhythm: Normal rate and regular rhythm.     Heart sounds: No murmur heard.   No friction rub. No gallop.  Pulmonary:     Effort: Pulmonary effort is  normal.     Breath sounds: No wheezing or rales.  Abdominal:     General: There is no distension.     Palpations: Abdomen is soft.     Tenderness: There is no abdominal tenderness.  Musculoskeletal:        General: No tenderness.     Cervical back: Normal range of motion and neck supple.  Skin:    General: Skin is warm and dry.  Neurological:     Mental Status: She is alert and oriented to person, place, and time.  Psychiatric:        Behavior: Behavior normal.    ED Results / Procedures / Treatments   Labs (all labs ordered are listed, but only abnormal results are displayed) Labs Reviewed  CBC WITH DIFFERENTIAL/PLATELET - Abnormal; Notable for the following components:      Result Value   RBC 3.52 (*)    Hemoglobin 11.8 (*)    MCV 104.5 (*)    All other components within normal limits  COMPREHENSIVE METABOLIC PANEL - Abnormal; Notable for the following components:   Potassium 3.1 (*)    Glucose, Bld 107 (*)    Calcium 8.5 (*)    Total Protein 6.2 (*)    Albumin 3.2 (*)    Total Bilirubin 1.8 (*)    All other components within normal limits  LACTIC ACID, PLASMA - Abnormal; Notable for the following components:   Lactic Acid, Venous 2.3 (*)    All other components within normal limits  URINALYSIS, ROUTINE W REFLEX MICROSCOPIC - Abnormal; Notable for the following components:   APPearance HAZY (*)    Bilirubin Urine MODERATE (*)    Ketones, ur 20 (*)    Protein, ur 100 (*)    Bacteria, UA RARE (*)    All other components within normal limits  LACTIC ACID, PLASMA  CBG MONITORING, ED    EKG EKG Interpretation  Date/Time:  Tuesday January 06 2021 12:14:56 EDT Ventricular Rate:  71 PR Interval:  258 QRS Duration: 156 QT Interval:  478 QTC Calculation: 520  R Axis:   -79 Text Interpretation: Sinus or ectopic atrial rhythm Prolonged PR interval LVH with IVCD, LAD and secondary repol abnrm Prolonged QT interval Probable RV involvement, suggest recording right  precordial leads widened qrs from prior though appears v paced Otherwise no significant change Confirmed by Deno Etienne [id number]) on 01/06/2021 12:55:03 PM  Radiology DG Chest Port 1 View  Result Date: 01/06/2021 CLINICAL DATA:  Hypotension. EXAM: PORTABLE CHEST 1 VIEW COMPARISON:  November 24, 2020. FINDINGS: The heart size and mediastinal contours are within normal limits. Both lungs are clear. Right-sided pacemaker is unchanged in position. The visualized skeletal structures are unremarkable. IMPRESSION: No active disease. Electronically Signed   By: Marijo Conception M.D.   On: 01/06/2021 14:09    Procedures Procedures   Medications Ordered in ED Medications  sodium chloride 0.9 % bolus 1,000 mL (0 mLs Intravenous Stopped 01/06/21 1436)  lip balm (CARMEX) ointment (1 application Topical Given 01/06/21 1440)    ED Course  I have reviewed the triage vital signs and the nursing notes.  Pertinent labs & imaging results that were available during my care of the patient were reviewed by me and considered in my medical decision making (see chart for details).    MDM Rules/Calculators/A&P                           87 yo F with a chief complaints of near syncopal event.  Hypotensive with EMS and hypotensive again here.  MAP greater than 65.  Less likely to be infectious based on history of not eating and drinking at home.  We will give a bolus of IV fluids.  Screen for infection.  Reassess.  UA without obvious infection.  Chest x-ray viewed by me without focal infiltrate.  Patient blood pressure improved significantly with 1 bolus of IV fluids.  She is telling her son that she is ready to get out of here and she plans on walking out the front door.  I discussed the case with the patient and her son.  He feels that her dehydration and malnutrition and are due to her requiring help getting to the bathroom and she is concerned that there is no enough help and she wants to be able to get to the bathroom  soon enough.  She has not gotten up and ambulated much recently.  Has been eating less and less.  She tried to walk today and went to the bathroom and then had an episode where she lost consciousness.  Quickly back to baseline.  Most likely vasovagal by history.  She had a mild lactic acidosis which I think is also septic secondary to dehydration.  Son is requesting evaluation to try and see if she can be sent to a different rehab facility.  We will have transition of care team reach out to him.  CRITICAL CARE Performed by: Cecilio Asper   Total critical care time: 35 minutes  Critical care time was exclusive of separately billable procedures and treating other patients.  Critical care was necessary to treat or prevent imminent or life-threatening deterioration.  Critical care was time spent personally by me on the following activities: development of treatment plan with patient and/or surrogate as well as nursing, discussions with consultants, evaluation of patient's response to treatment, examination of patient, obtaining history from patient or surrogate, ordering and performing treatments and interventions, ordering and review of laboratory studies, ordering and review of radiographic studies,  pulse oximetry and re-evaluation of patient's condition.   3:10 PM:  I have discussed the diagnosis/risks/treatment options with the patient and believe the pt to be eligible for discharge home to follow-up with PCP. We also discussed returning to the ED immediately if new or worsening sx occur. We discussed the sx which are most concerning (e.g., sudden worsening pain, fever, inability to tolerate by mouth) that necessitate immediate return. Medications administered to the patient during their visit and any new prescriptions provided to the patient are listed below.  Medications given during this visit Medications  sodium chloride 0.9 % bolus 1,000 mL (0 mLs Intravenous Stopped 01/06/21 1436)   lip balm (CARMEX) ointment (1 application Topical Given 01/06/21 1440)     The patient appears reasonably screen and/or stabilized for discharge and I doubt any other medical condition or other [facility] requiring further screening, evaluation, or treatment in the ED at this time prior to discharge.   Final Clinical Impression(s) / ED Diagnoses Final diagnoses:  Syncope and collapse  Dehydration    Rx / DC Orders ED Discharge Orders     None        Deno Etienne, DO 01/06/21 1510

## 2021-01-06 NOTE — ED Triage Notes (Signed)
Patient BIB [facility] from [place]. Patient c/o near syncopal episode while walking to the bathroom. Per patient son, patient has not been eating or drinking well. Patient also c/o nausea. 22g in left forearm was placed by EMS, 342m  of NS was given by EMS  90/50-BP 60-HR 16-RR 99% RA 113-CBG

## 2021-01-06 NOTE — ED Notes (Signed)
Another attempt to contact [facility] for update and discharge report.

## 2021-01-06 NOTE — ED Notes (Signed)
Called [facility] to ensure that transportation has been arranged.
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
## 2021-01-09 DIAGNOSIS — R1310 Dysphagia, unspecified: Secondary | ICD-10-CM | POA: Diagnosis not present

## 2021-01-09 DIAGNOSIS — E46 Unspecified protein-calorie malnutrition: Secondary | ICD-10-CM | POA: Diagnosis not present

## 2021-01-09 DIAGNOSIS — I509 Heart failure, unspecified: Secondary | ICD-10-CM | POA: Diagnosis not present

## 2021-01-09 DIAGNOSIS — E039 Hypothyroidism, unspecified: Secondary | ICD-10-CM | POA: Diagnosis not present

## 2021-01-09 DIAGNOSIS — I10 Essential (primary) hypertension: Secondary | ICD-10-CM | POA: Diagnosis not present

## 2021-01-09 DIAGNOSIS — I4891 Unspecified atrial fibrillation: Secondary | ICD-10-CM | POA: Diagnosis not present

## 2021-01-09 DIAGNOSIS — F0390 Unspecified dementia without behavioral disturbance: Secondary | ICD-10-CM | POA: Diagnosis not present

## 2021-01-09 DIAGNOSIS — D696 Thrombocytopenia, unspecified: Secondary | ICD-10-CM | POA: Diagnosis not present

## 2021-01-12 DIAGNOSIS — Z9119 Patient's noncompliance with other medical treatment and regimen: Secondary | ICD-10-CM | POA: Diagnosis not present

## 2021-01-12 DIAGNOSIS — L89150 Pressure ulcer of sacral region, unstageable: Secondary | ICD-10-CM | POA: Diagnosis not present

## 2021-01-12 DIAGNOSIS — Z515 Encounter for palliative care: Secondary | ICD-10-CM | POA: Diagnosis not present

## 2021-01-12 DIAGNOSIS — R54 Age-related physical debility: Secondary | ICD-10-CM | POA: Diagnosis not present

## 2021-01-12 DIAGNOSIS — E441 Mild protein-calorie malnutrition: Secondary | ICD-10-CM | POA: Diagnosis not present

## 2021-01-26 ENCOUNTER — Ambulatory Visit: Payer: Medicare Other | Admitting: Psychology

## 2021-01-29 DIAGNOSIS — I4891 Unspecified atrial fibrillation: Secondary | ICD-10-CM | POA: Diagnosis not present

## 2021-01-29 DIAGNOSIS — I10 Essential (primary) hypertension: Secondary | ICD-10-CM | POA: Diagnosis not present

## 2021-01-29 DIAGNOSIS — I509 Heart failure, unspecified: Secondary | ICD-10-CM | POA: Diagnosis not present

## 2021-01-29 DIAGNOSIS — F419 Anxiety disorder, unspecified: Secondary | ICD-10-CM | POA: Diagnosis not present

## 2021-01-29 DIAGNOSIS — F0390 Unspecified dementia without behavioral disturbance: Secondary | ICD-10-CM | POA: Diagnosis not present

## 2021-01-29 DIAGNOSIS — E039 Hypothyroidism, unspecified: Secondary | ICD-10-CM | POA: Diagnosis not present

## 2021-01-29 DIAGNOSIS — N183 Chronic kidney disease, stage 3 unspecified: Secondary | ICD-10-CM | POA: Diagnosis not present

## 2021-01-30 ENCOUNTER — Ambulatory Visit: Payer: Medicare Other | Admitting: Psychology

## 2021-02-04 ENCOUNTER — Emergency Department (HOSPITAL_COMMUNITY)
Admission: EM | Admit: 2021-02-04 | Discharge: 2021-02-04 | Disposition: A | Discharge status: Home / Self Care | Payer: Medicare Other | Attending: Emergency Medicine | Admitting: Emergency Medicine

## 2021-02-04 ENCOUNTER — Other Ambulatory Visit: Payer: Self-pay

## 2021-02-04 ENCOUNTER — Encounter (HOSPITAL_COMMUNITY): Payer: Self-pay

## 2021-02-04 DIAGNOSIS — F0390 Unspecified dementia without behavioral disturbance: Secondary | ICD-10-CM | POA: Diagnosis not present

## 2021-02-04 DIAGNOSIS — Z79899 Other long term (current) drug therapy: Secondary | ICD-10-CM | POA: Diagnosis not present

## 2021-02-04 DIAGNOSIS — R630 Anorexia: Secondary | ICD-10-CM | POA: Insufficient documentation

## 2021-02-04 DIAGNOSIS — J45909 Unspecified asthma, uncomplicated: Secondary | ICD-10-CM | POA: Diagnosis not present

## 2021-02-04 DIAGNOSIS — I4891 Unspecified atrial fibrillation: Secondary | ICD-10-CM | POA: Insufficient documentation

## 2021-02-04 DIAGNOSIS — Z950 Presence of cardiac pacemaker: Secondary | ICD-10-CM | POA: Insufficient documentation

## 2021-02-04 DIAGNOSIS — I130 Hypertensive heart and chronic kidney disease with heart failure and stage 1 through stage 4 chronic kidney disease, or unspecified chronic kidney disease: Secondary | ICD-10-CM | POA: Insufficient documentation

## 2021-02-04 DIAGNOSIS — N390 Urinary tract infection, site not specified: Secondary | ICD-10-CM | POA: Diagnosis not present

## 2021-02-04 DIAGNOSIS — Z955 Presence of coronary angioplasty implant and graft: Secondary | ICD-10-CM | POA: Diagnosis not present

## 2021-02-04 DIAGNOSIS — I2510 Atherosclerotic heart disease of native coronary artery without angina pectoris: Secondary | ICD-10-CM | POA: Diagnosis not present

## 2021-02-04 DIAGNOSIS — I509 Heart failure, unspecified: Secondary | ICD-10-CM | POA: Diagnosis not present

## 2021-02-04 DIAGNOSIS — F419 Anxiety disorder, unspecified: Secondary | ICD-10-CM

## 2021-02-04 DIAGNOSIS — Z853 Personal history of malignant neoplasm of breast: Secondary | ICD-10-CM | POA: Diagnosis not present

## 2021-02-04 DIAGNOSIS — N183 Chronic kidney disease, stage 3 unspecified: Secondary | ICD-10-CM | POA: Diagnosis not present

## 2021-02-04 DIAGNOSIS — E039 Hypothyroidism, unspecified: Secondary | ICD-10-CM | POA: Insufficient documentation

## 2021-02-04 DIAGNOSIS — R55 Syncope and collapse: Secondary | ICD-10-CM | POA: Insufficient documentation

## 2021-02-04 DIAGNOSIS — Z7901 Long term (current) use of anticoagulants: Secondary | ICD-10-CM | POA: Diagnosis not present

## 2021-02-04 LAB — URINALYSIS, ROUTINE W REFLEX MICROSCOPIC
Bilirubin Urine: NEGATIVE
Glucose, UA: NEGATIVE mg/dL
Hgb urine dipstick: NEGATIVE
Ketones, ur: 5 mg/dL — AB
Nitrite: POSITIVE — AB
Protein, ur: 100 mg/dL — AB
Specific Gravity, Urine: 1.021 (ref 1.005–1.030)
WBC, UA: >50 WBC/hpf — ABNORMAL HIGH (ref 0–5)
pH: 6 (ref 5.0–8.0)

## 2021-02-04 MED ORDER — CEPHALEXIN 500 MG PO CAPS: 500.0000 mg | ORAL_CAPSULE | Freq: Two times a day (BID) | ORAL | 0 refills | Status: AC

## 2021-02-04 MED ORDER — CEPHALEXIN 500 MG PO CAPS: 500.0000 mg | ORAL_CAPSULE | Freq: Two times a day (BID) | ORAL | 0 refills | Status: DC

## 2021-02-04 NOTE — Discharge Instructions (Addendum)
I spoke to [name] and her son in the ER.  They did not want blood tests, IV fluids, IV placement, or any workup bedside a urine sample.  I made it clear to them that there are many serious medical conditions that could be causing her unresponsive "spells," including benzodiazepine withdrawal, infections, electrolyte issues (sodium, potassium, calcium), dehydration, arrhythmias, anemia, and other conditions.  Many of these can be life threatening, and require further workup.  They did not want this workup at this time.  If they do not want further medical workup, they need to make this clear to her care facility.  This would mean talking to their doctor about a Do Not Hospitalize form, to avoid further trips to the ER.  They should have a goals of care discussion with palliative care, including consideration of hospice, if she continues to decline.  Her son told me that they do have a palliative consult next week.

## 2021-02-04 NOTE — ED Provider Notes (Signed)
[facility] DEPT Provider Note   CSN: [id number] Arrival date & time: 02/04/21  1152     History Chief Complaint  Patient presents with   Anxiety    [name] is a 87 y.o. female with history of chronic any disease, congestive heart failure, dysphagia, hypertension, depression, anxiety, presenting to the emergency department by EMS from her care facility with concern for anxiety spells and unresponsive episodes.  Here from [place].  The patient is very poor historian, likely related to underlying dementia.  She cannot tell me why she was in the hospital, but is asking repeatedly to leave.  Her son is present at bedside provides supplemental history.  He reports that the patient has been having periodic episodes lasting a few seconds where she becomes very briefly unresponsive, her eyes appear to roll back in her head, and then she seems to come to and start talking to him.  This is happened maybe 3 times in the past 3 weeks.  Importantly, both the patient and her son confirmed that she has stopped taking all medications prescribed to her.  They wish to avoid any aggressive medical intervention.  The patient's son has asked that blood draws be avoided due to "religious faith beliefs."  He simply wanted her to be medically screened by a physician, and states, " You know now that she's been doing okay a while, and her vital signs all look good, and she's behaving normally, I want to get her out of here."   They confirmed that the patient had chronically been on benzodiazepines, but the patient abruptly stopped taking these medications about 3 weeks ago, along with her other medications.  No witnessed seizures.    Her son reports they have a meeting with the patient's PCP and a palliative care consult next week.  The patient is DNR/DNI.  He and she are aware that she has been struggling with poor p.o. intake, likely dehydration, and failure to thrive.  Per  medical record review patient was treated for UTI and hospitalized for confusion in August 2022, about 2 months ago.  She was also seen in the ED on January 06 2021 for syncopal episode.  HPI     Past Medical History:  Diagnosis Date   Anxiety    Bradycardia    Breast cancer, left breast ([place]) 1995   Breast cancer, right breast ([place]) 2002   CHF (congestive heart failure) (HCC)    Depression    Dr Cheryln Manly   Discoid lupus    skin   Full dentures    GERD (gastroesophageal reflux disease)    Glaucoma    Gout    Hx of echocardiogram    a.  Echocardiogram (03/2011): Mild LVH, EF 01-77%, grade 1 diastolic dysfunction, mild MR, mild to moderate TR, PASP 42;  b. Echo (05/2013):  Mod LVH, EF 65-70%, no WMA, Gr 1 DD, mild MR, mod TR, PASP 39   Hx of exercise stress test    a. ETT-echocardiogram (06/08/11): Sub-optimal exercise. Test stopped early due to dizziness and hypotension. EF 60%.  Non-diagnostic.;   b. Lexiscan Myoview (06/2013):  Diaphragmatic attenuation, no ischemia, EF 61%.  Low Risk   Hypertension    Hypothyroidism    IBS (irritable bowel syndrome)    Incontinence    OSA (obstructive sleep apnea) 07/20/2016   Mild with AHI 12/hr now on CPAP   Osteoarthritis    Osteopenia    Pacemaker 2009   Brady//Chronotropic incompetence with  normal pacemaker function   Thyroid disease    Wears hearing aid    both ears    Patient Active Problem List   Diagnosis Date Noted   Protein-calorie malnutrition, severe 11/28/2020   Pressure injury of skin 11/28/2020   AMS (altered mental status) 11/26/2020   Acute lower UTI 11/26/2020   Moderate recurrent major depression ([place]) 05/09/2020   Generalized anxiety disorder 05/09/2020   Moderately severe recurrent major depression ([place]) 05/09/2020   Weight loss 10/09/2019   DVT (deep venous thrombosis) ([place]) 03/29/2018   PE (pulmonary thromboembolism) ([place]) 02/03/2018   Macrocytosis 12/21/2017   Wart 12/21/2017   Sleep apnea 07/20/2016    Sigmoid diverticulitis 07/01/2016   Acute diverticulitis    Rectal bleeding 06/30/2016   Impacted cerumen of right ear 03/26/2016   Syncope 03/22/2016   Benign essential HTN 03/22/2016   CAD (coronary artery disease) 03/22/2016   CKD (chronic kidney disease) stage 3, GFR 30-59 ml/min (HCC) 03/22/2016   Depression 03/22/2016   Exertional dyspnea 11/04/2015   Ascending aortic aneurysm 11/04/2015   Discoid lupus 10/10/2015   Arthralgia 10/10/2015   Pulmonary hypertension ([place]) 09/30/2015   Mild persistent asthma 09/30/2015   Irritable larynx 09/04/2015   Rotator cuff disorder 03/06/2015   OCD (obsessive compulsive disorder) 04/03/2013   IBS (irritable bowel syndrome) 07/03/2012   Dysphagia 04/13/2012   Denture irritation 12/20/2011   Irritable bladder 12/20/2011   Knee pain, bilateral 12/20/2011   Hearing loss 10/11/2011   Pacemaker-[employer]tronic 05/18/2011   DOE (dyspnea on exertion) 04/13/2011   Gout, unspecified 04/30/2010   Anal fissure 02/03/2010   Atrial fibrillation ([place]) 08/26/2009   Sinoatrial node dysfunction ([place]) 09/17/2008   HYPOTENSION, ORTHOSTATIC 01/29/2008   Microscopic hematuria 08/21/2007   Pain in joint, pelvic region and thigh 08/01/2007   Vitamin D deficiency 05/23/2007   Anxiety disorder 05/23/2007   Hypothyroidism 03/22/2007   GERD 03/22/2007   Osteoarthritis 03/22/2007   CFS (chronic fatigue syndrome) 11/17/2006   Hypertensive heart disease 11/17/2006   ALLERGIC RHINITIS 11/17/2006   OSTEOPOROSIS 11/17/2006   BREAST CANCER, HX OF 11/17/2006    Past Surgical History:  Procedure Laterality Date   BREAST BIOPSY Left 1995   BREAST LUMPECTOMY Left 1995   axillary node dissectoin   CARDIAC CATHETERIZATION N/A 02/13/2016   Procedure: Right Heart Cath;  Surgeon: Larey Dresser, MD;  Location: [facility];  Service: Cardiovascular;  Laterality: N/A;   CATARACT EXTRACTION W/ INTRAOCULAR LENS  IMPLANT, BILATERAL Bilateral 06/2009 - 12.2915   left -  right   DORSAL COMPARTMENT RELEASE  2000   left   HAMMER TOE SURGERY Left 2004   HEMORRHOID BANDING     INSERT / REPLACE / REMOVE PACEMAKER  2009   KNEE ARTHROSCOPY Left 08/29/2012   Procedure: LEFT KNEE ARTHROSCOPY ;  Surgeon: Hessie Dibble, MD;  Location: [facility];  Service: Orthopedics;  Laterality: Left;   MASTECTOMY Bilateral 09/2000   nbx   PPM GENERATOR CHANGEOUT N/A 07/16/2019   Procedure: PPM GENERATOR CHANGEOUT;  Surgeon: Deboraha Sprang, MD;  Location: [facility];  Service: Cardiovascular;  Laterality: N/A;   TRIGGER FINGER RELEASE Right 07/31/2013   Procedure: EXCISE MASS RIGHT INDEX A-1 PLLLEY RELEASE A-1 RIGHT INDEX;  Surgeon: Cammie Sickle., MD;  Location: [facility];  Service: Orthopedics;  Laterality: Right;   VAGINAL HYSTERECTOMY       OB History   No obstetric history on file.  Family History  Problem Relation Age of Onset   Ovarian cancer Mother    Heart disease Father    Stroke Sister    Stroke Brother    Cancer Brother    Heart disease Brother     Social History   Tobacco Use   Smoking status: Never   Smokeless tobacco: Never  Vaping Use   Vaping Use: Never used  Substance Use Topics   Alcohol use: No    Alcohol/week: 0.0 standard drinks   Drug use: No    Home Medications Prior to Admission medications   Medication Sig Start Date End Date Taking? Authorizing Provider  ascorbic acid (VITAMIN C) 250 MG tablet Take 1 tablet (250 mg total) by mouth 2 (two) times daily. Patient not taking: No sig reported 12/01/20   Nolberto Hanlon, MD  divalproex (DEPAKOTE SPRINKLE) 125 MG capsule Take 1 capsule (125 mg total) by mouth 2 (two) times daily. 12/01/20   Nolberto Hanlon, MD  ELIQUIS 2.5 MG TABS tablet Take 2.5 mg by mouth 2 (two) times daily. 10/03/20   [provider]  FLUoxetine (PROZAC) 20 MG capsule Take 20 mg by mouth daily. 11/24/20   [provider]  levothyroxine (SYNTHROID) 88 MCG tablet  Take 88 mcg by mouth daily before breakfast.    [provider]  melatonin 5 MG TABS Take 5 mg by mouth at bedtime.    [provider]  Multiple Vitamin (MULTIVITAMIN WITH MINERALS) TABS tablet Take 1 tablet by mouth daily. 12/02/20   Nolberto Hanlon, MD  pantoprazole (PROTONIX) 40 MG tablet Take 1 tablet (40 mg total) by mouth daily. 07/31/20   Virgel Manifold, MD  traZODone (DESYREL) 50 MG tablet Take 25-50 mg by mouth at bedtime as needed for sleep. 09/23/20   [provider]    Allergies    Lactase, Amlodipine besy-benazepril hcl, Amlodipine besylate, Clonidine hydrochloride, Lactose intolerance (gi), Valsartan, and Verapamil  Review of Systems   Review of Systems  Unable to perform ROS: Dementia (level 5 caveat)   Physical Exam Updated Vital Signs BP 100/68   Pulse 70   Temp 97.7 F (36.5 C) (Oral)   Resp 18   SpO2 98%   Physical Exam Constitutional:      General: She is not in acute distress.    Comments: Thin, frail  HENT:     Head: Normocephalic and atraumatic.  Eyes:     Conjunctiva/sclera: Conjunctivae normal.     Pupils: Pupils are equal, round, and reactive to light.  Cardiovascular:     Rate and Rhythm: Normal rate and regular rhythm.  Pulmonary:     Effort: Pulmonary effort is normal. No respiratory distress.  Abdominal:     General: There is no distension.     Tenderness: There is no abdominal tenderness.  Skin:    General: Skin is warm and dry.  Neurological:     General: No focal deficit present.     Mental Status: She is alert. Mental status is at baseline.     Comments: AAO to person and self, not to date At baseline behavior per son's report    ED Results / Procedures / Treatments   Labs (all labs ordered are listed, but only abnormal results are displayed) Labs Reviewed  URINALYSIS, ROUTINE W REFLEX MICROSCOPIC    EKG None  Radiology No results found.  Procedures Procedures   Medications Ordered in  ED Medications - No data to display  ED Course  I have  reviewed the triage vital signs and the nursing notes.  Pertinent labs & imaging results that were available during my care of the patient were reviewed by me and considered in my medical decision making (see chart for details).  Patient is here for nonspecific spells that are described as staring spells at her facility, brought here by her son with request for physician evaluation.  Patient appears to be at her baseline mental status per her son's description, she does have some dementia but is able to answer simple and focused questions.  Her memory is quite poor and she cannot recall events from earlier.  This appears to be a chronic condition based on her medical records, and likely advancing.  Her son reports they have a palliative care consult next week to discuss goals of care given their wish to avoid recurring hospitalizations and medical interventions.  I think this would benefit her greatly.  I discussed with the patient and her son at length the limitations we face in the hospital if they are refusing workup, labs, and medication treatment.  I explained to him that there is a broad differential diagnosis for her staring spells, which may include benzo withdrawals, infection, dehydration, electrolyte derangements, and other serious conditions.  These require further workup, which they do not want at this time.  If their goals of care are to avoid further hospitalization and workup, that is reasonable.  However they should understand that this can lead to rapid deterioration, cognitive decline, and even death.  They are agreeable to urine sample.  We can check this for signs of infection and also for signs of dehydration.  They prefer to take p.o. fluids if possible, would like to avoid an IV.  Following the UA the patient will be discharged back to her facility.  I advised them to discuss goals of care at a palliative consult next  week.  Pt signed out to Dr Armandina Gemma EDP at 3:15 pm pending f/u UA results, anticipating discharge back to facility.    Final Clinical Impression(s) / ED Diagnoses Final diagnoses:  Anxiety    Rx / DC Orders ED Discharge Orders     None        Wyvonnia Dusky, MD 02/04/21 [phone]

## 2021-02-04 NOTE — ED Notes (Signed)
Pt leaving with [facility] 

## 2021-02-04 NOTE — ED Notes (Signed)
[facility] called  

## 2021-02-04 NOTE — ED Triage Notes (Signed)
Pt BIB EMS from [facility]. Facility reports pt is extremely anxious, pt has hx of anxious. Facility reports pt will stop speaking to staff with having anxiety. Facility denies falls. Pt A&Ox4 per EMS. Pt coming in with DNR. EMS reports family told EMS not to do any IV or meds.  128/70 CBG 138 HR 70

## 2021-02-04 NOTE — ED Provider Notes (Signed)
   MDM    40F, presenting with near syncopal episodes, eyes folling back into her head. Has been off her meds for the past few weeks. Refusing blood draws and medications. Will obtain UA for UTI. Look for dehydration. Refusing IV, orally rehydrating.  Please see Dr. Jones Broom note for prior complete HPI and MDM.   Urinalysis positive for 5 ketones, proteinuria also likely urinary tract infection with positive nitrites, leukocytes, greater than 50 WBCs, rare bacteria.  After discussion with family, will treat with a short course of antibiotics. Appears safe for DC.   Regan Lemming, MD 02/04/21 [phone]

## 2021-02-06 DIAGNOSIS — I509 Heart failure, unspecified: Secondary | ICD-10-CM | POA: Diagnosis not present

## 2021-02-06 DIAGNOSIS — R627 Adult failure to thrive: Secondary | ICD-10-CM | POA: Diagnosis not present

## 2021-02-06 DIAGNOSIS — F32A Depression, unspecified: Secondary | ICD-10-CM | POA: Diagnosis not present

## 2021-02-06 DIAGNOSIS — N183 Chronic kidney disease, stage 3 unspecified: Secondary | ICD-10-CM | POA: Diagnosis not present

## 2021-02-06 DIAGNOSIS — N390 Urinary tract infection, site not specified: Secondary | ICD-10-CM | POA: Diagnosis not present

## 2021-02-06 DIAGNOSIS — R100 Acute abdomen: Secondary | ICD-10-CM | POA: Diagnosis not present

## 2021-02-06 DIAGNOSIS — E46 Unspecified protein-calorie malnutrition: Secondary | ICD-10-CM | POA: Diagnosis not present

## 2021-02-06 DIAGNOSIS — K219 Gastro-esophageal reflux disease without esophagitis: Secondary | ICD-10-CM | POA: Diagnosis not present

## 2021-02-09 DIAGNOSIS — I951 Orthostatic hypotension: Secondary | ICD-10-CM | POA: Diagnosis not present

## 2021-02-09 DIAGNOSIS — M24541 Contracture, right hand: Secondary | ICD-10-CM | POA: Diagnosis not present

## 2021-02-09 DIAGNOSIS — F331 Major depressive disorder, recurrent, moderate: Secondary | ICD-10-CM | POA: Diagnosis not present

## 2021-02-09 DIAGNOSIS — F0390 Unspecified dementia without behavioral disturbance: Secondary | ICD-10-CM | POA: Diagnosis not present

## 2021-02-09 DIAGNOSIS — G4700 Insomnia, unspecified: Secondary | ICD-10-CM | POA: Diagnosis not present

## 2021-02-09 DIAGNOSIS — Z515 Encounter for palliative care: Secondary | ICD-10-CM | POA: Diagnosis not present

## 2021-02-12 DIAGNOSIS — K219 Gastro-esophageal reflux disease without esophagitis: Secondary | ICD-10-CM | POA: Diagnosis not present

## 2021-02-12 DIAGNOSIS — F419 Anxiety disorder, unspecified: Secondary | ICD-10-CM | POA: Diagnosis not present

## 2021-02-12 DIAGNOSIS — I4891 Unspecified atrial fibrillation: Secondary | ICD-10-CM | POA: Diagnosis not present

## 2021-02-12 DIAGNOSIS — K589 Irritable bowel syndrome without diarrhea: Secondary | ICD-10-CM | POA: Diagnosis not present

## 2021-02-12 DIAGNOSIS — I5040 Unspecified combined systolic (congestive) and diastolic (congestive) heart failure: Secondary | ICD-10-CM | POA: Diagnosis not present

## 2021-02-12 DIAGNOSIS — N390 Urinary tract infection, site not specified: Secondary | ICD-10-CM | POA: Diagnosis not present

## 2021-02-12 DIAGNOSIS — E039 Hypothyroidism, unspecified: Secondary | ICD-10-CM | POA: Diagnosis not present

## 2021-02-12 DIAGNOSIS — I509 Heart failure, unspecified: Secondary | ICD-10-CM | POA: Diagnosis not present

## 2021-02-12 DIAGNOSIS — R4182 Altered mental status, unspecified: Secondary | ICD-10-CM | POA: Diagnosis not present

## 2021-02-12 DIAGNOSIS — G8101 Flaccid hemiplegia affecting right dominant side: Secondary | ICD-10-CM | POA: Diagnosis not present

## 2021-02-12 DIAGNOSIS — I679 Cerebrovascular disease, unspecified: Secondary | ICD-10-CM | POA: Diagnosis not present

## 2021-02-12 DIAGNOSIS — F32A Depression, unspecified: Secondary | ICD-10-CM | POA: Diagnosis not present

## 2021-02-12 DIAGNOSIS — I69359 Hemiplegia and hemiparesis following cerebral infarction affecting unspecified side: Secondary | ICD-10-CM | POA: Diagnosis not present

## 2021-02-12 DIAGNOSIS — E46 Unspecified protein-calorie malnutrition: Secondary | ICD-10-CM | POA: Diagnosis not present

## 2021-02-12 DIAGNOSIS — R627 Adult failure to thrive: Secondary | ICD-10-CM | POA: Diagnosis not present

## 2021-02-12 DIAGNOSIS — Z8489 Family history of other specified conditions: Secondary | ICD-10-CM | POA: Diagnosis not present

## 2021-02-12 DIAGNOSIS — F39 Unspecified mood [affective] disorder: Secondary | ICD-10-CM | POA: Diagnosis not present

## 2021-02-12 DIAGNOSIS — Z8673 Personal history of transient ischemic attack (TIA), and cerebral infarction without residual deficits: Secondary | ICD-10-CM | POA: Diagnosis not present

## 2021-02-12 DIAGNOSIS — Z5320 Procedure and treatment not carried out because of patient's decision for unspecified reasons: Secondary | ICD-10-CM | POA: Diagnosis not present

## 2021-02-12 DIAGNOSIS — N183 Chronic kidney disease, stage 3 unspecified: Secondary | ICD-10-CM | POA: Diagnosis not present

## 2021-02-12 DIAGNOSIS — I10 Essential (primary) hypertension: Secondary | ICD-10-CM | POA: Diagnosis not present

## 2021-02-16 DIAGNOSIS — Z8489 Family history of other specified conditions: Secondary | ICD-10-CM | POA: Diagnosis not present

## 2021-02-16 DIAGNOSIS — R627 Adult failure to thrive: Secondary | ICD-10-CM | POA: Diagnosis not present

## 2021-02-16 DIAGNOSIS — N390 Urinary tract infection, site not specified: Secondary | ICD-10-CM | POA: Diagnosis not present

## 2021-02-16 DIAGNOSIS — K219 Gastro-esophageal reflux disease without esophagitis: Secondary | ICD-10-CM | POA: Diagnosis not present

## 2021-02-16 DIAGNOSIS — E46 Unspecified protein-calorie malnutrition: Secondary | ICD-10-CM | POA: Diagnosis not present

## 2021-02-16 DIAGNOSIS — F39 Unspecified mood [affective] disorder: Secondary | ICD-10-CM | POA: Diagnosis not present

## 2021-02-16 DIAGNOSIS — I4891 Unspecified atrial fibrillation: Secondary | ICD-10-CM | POA: Diagnosis not present

## 2021-02-16 DIAGNOSIS — N183 Chronic kidney disease, stage 3 unspecified: Secondary | ICD-10-CM | POA: Diagnosis not present

## 2021-02-16 DIAGNOSIS — I69359 Hemiplegia and hemiparesis following cerebral infarction affecting unspecified side: Secondary | ICD-10-CM | POA: Diagnosis not present

## 2021-02-16 DIAGNOSIS — I509 Heart failure, unspecified: Secondary | ICD-10-CM | POA: Diagnosis not present

## 2021-02-16 DIAGNOSIS — K589 Irritable bowel syndrome without diarrhea: Secondary | ICD-10-CM | POA: Diagnosis not present

## 2021-02-16 DIAGNOSIS — I679 Cerebrovascular disease, unspecified: Secondary | ICD-10-CM | POA: Diagnosis not present

## 2021-02-23 ENCOUNTER — Ambulatory Visit: Payer: Medicare Other | Admitting: Psychology

## 2021-02-27 DIAGNOSIS — F419 Anxiety disorder, unspecified: Secondary | ICD-10-CM | POA: Diagnosis not present

## 2021-02-27 DIAGNOSIS — I679 Cerebrovascular disease, unspecified: Secondary | ICD-10-CM | POA: Diagnosis not present

## 2021-02-27 DIAGNOSIS — I69359 Hemiplegia and hemiparesis following cerebral infarction affecting unspecified side: Secondary | ICD-10-CM | POA: Diagnosis not present

## 2021-02-27 DIAGNOSIS — F39 Unspecified mood [affective] disorder: Secondary | ICD-10-CM | POA: Diagnosis not present

## 2021-03-13 DIAGNOSIS — F419 Anxiety disorder, unspecified: Secondary | ICD-10-CM | POA: Diagnosis not present

## 2021-03-13 DIAGNOSIS — F39 Unspecified mood [affective] disorder: Secondary | ICD-10-CM | POA: Diagnosis not present

## 2021-03-13 DIAGNOSIS — Z5320 Procedure and treatment not carried out because of patient's decision for unspecified reasons: Secondary | ICD-10-CM | POA: Diagnosis not present

## 2021-03-23 ENCOUNTER — Ambulatory Visit: Payer: Medicare Other | Admitting: Psychology

## 2021-03-27 DIAGNOSIS — F419 Anxiety disorder, unspecified: Secondary | ICD-10-CM | POA: Diagnosis not present

## 2021-03-27 DIAGNOSIS — F39 Unspecified mood [affective] disorder: Secondary | ICD-10-CM | POA: Diagnosis not present

## 2021-03-27 DIAGNOSIS — Z5320 Procedure and treatment not carried out because of patient's decision for unspecified reasons: Secondary | ICD-10-CM | POA: Diagnosis not present

## 2021-04-08 DIAGNOSIS — E46 Unspecified protein-calorie malnutrition: Secondary | ICD-10-CM | POA: Diagnosis not present

## 2021-04-08 DIAGNOSIS — I69359 Hemiplegia and hemiparesis following cerebral infarction affecting unspecified side: Secondary | ICD-10-CM | POA: Diagnosis not present

## 2021-04-08 DIAGNOSIS — R627 Adult failure to thrive: Secondary | ICD-10-CM | POA: Diagnosis not present

## 2021-04-08 DIAGNOSIS — I679 Cerebrovascular disease, unspecified: Secondary | ICD-10-CM | POA: Diagnosis not present

## 2021-04-08 DIAGNOSIS — N183 Chronic kidney disease, stage 3 unspecified: Secondary | ICD-10-CM | POA: Diagnosis not present

## 2021-04-08 DIAGNOSIS — I4891 Unspecified atrial fibrillation: Secondary | ICD-10-CM | POA: Diagnosis not present

## 2021-04-08 DIAGNOSIS — K589 Irritable bowel syndrome without diarrhea: Secondary | ICD-10-CM | POA: Diagnosis not present

## 2021-04-08 DIAGNOSIS — R55 Syncope and collapse: Secondary | ICD-10-CM | POA: Diagnosis not present

## 2021-04-08 DIAGNOSIS — F39 Unspecified mood [affective] disorder: Secondary | ICD-10-CM | POA: Diagnosis not present

## 2021-04-08 DIAGNOSIS — Z8489 Family history of other specified conditions: Secondary | ICD-10-CM | POA: Diagnosis not present

## 2021-04-08 DIAGNOSIS — K219 Gastro-esophageal reflux disease without esophagitis: Secondary | ICD-10-CM | POA: Diagnosis not present

## 2021-04-08 DIAGNOSIS — I509 Heart failure, unspecified: Secondary | ICD-10-CM | POA: Diagnosis not present

## 2021-04-10 DIAGNOSIS — E785 Hyperlipidemia, unspecified: Secondary | ICD-10-CM | POA: Diagnosis not present

## 2021-04-10 DIAGNOSIS — I10 Essential (primary) hypertension: Secondary | ICD-10-CM | POA: Diagnosis not present

## 2021-04-10 DIAGNOSIS — D649 Anemia, unspecified: Secondary | ICD-10-CM | POA: Diagnosis not present

## 2021-04-17 DIAGNOSIS — D649 Anemia, unspecified: Secondary | ICD-10-CM | POA: Diagnosis not present

## 2021-04-17 DIAGNOSIS — E785 Hyperlipidemia, unspecified: Secondary | ICD-10-CM | POA: Diagnosis not present

## 2021-04-17 DIAGNOSIS — I10 Essential (primary) hypertension: Secondary | ICD-10-CM | POA: Diagnosis not present

## 2021-04-22 DIAGNOSIS — E039 Hypothyroidism, unspecified: Secondary | ICD-10-CM | POA: Diagnosis not present

## 2021-04-22 DIAGNOSIS — D539 Nutritional anemia, unspecified: Secondary | ICD-10-CM | POA: Diagnosis not present

## 2021-04-22 DIAGNOSIS — D649 Anemia, unspecified: Secondary | ICD-10-CM | POA: Diagnosis not present

## 2021-04-22 DIAGNOSIS — E876 Hypokalemia: Secondary | ICD-10-CM | POA: Diagnosis not present

## 2021-04-22 DIAGNOSIS — K589 Irritable bowel syndrome without diarrhea: Secondary | ICD-10-CM | POA: Diagnosis not present

## 2021-04-22 DIAGNOSIS — D509 Iron deficiency anemia, unspecified: Secondary | ICD-10-CM | POA: Diagnosis not present

## 2021-04-22 DIAGNOSIS — D72819 Decreased white blood cell count, unspecified: Secondary | ICD-10-CM | POA: Diagnosis not present

## 2021-04-22 DIAGNOSIS — I10 Essential (primary) hypertension: Secondary | ICD-10-CM | POA: Diagnosis not present

## 2021-04-22 DIAGNOSIS — E785 Hyperlipidemia, unspecified: Secondary | ICD-10-CM | POA: Diagnosis not present

## 2021-04-30 ENCOUNTER — Telehealth: Payer: Self-pay

## 2021-04-30 NOTE — Telephone Encounter (Signed)
Pt discharge from [facility] on 04/27/21. Pt left without the Discharge being complete to Dr. At facility not willing to give orders for OT/PT care etc.   Levada Dy is calling in from [facility] to get Verbal orders for: Skill nursing for Med management, diease management, OT and PT.   Levada Dy confirm the Post discharge follow up appt for 05/14/21 to give to the son. She was hoping to get verbal orders sooner than the 19th. Her contact info is [phone].  Please contact Levada Dy with update about orders.

## 2021-05-01 NOTE — Telephone Encounter (Signed)
Notified [name].Marland KitchenMarland KitchenPt has not seen provider since March 2022. Pt must see provider first.[name] stated that will be ok they kind of figure it due to pt not been seen since 06/2020.Marland KitchenJohny Chess

## 2021-05-14 ENCOUNTER — Ambulatory Visit (INDEPENDENT_AMBULATORY_CARE_PROVIDER_SITE_OTHER): Payer: Medicare Other | Admitting: Internal Medicine

## 2021-05-14 ENCOUNTER — Encounter: Payer: Self-pay | Admitting: Internal Medicine

## 2021-05-14 ENCOUNTER — Other Ambulatory Visit: Payer: Self-pay

## 2021-05-14 VITALS — BP 120/80 | HR 75 | Temp 98.3°F | Ht 64.0 in | Wt 102.0 lb

## 2021-05-14 DIAGNOSIS — M810 Age-related osteoporosis without current pathological fracture: Secondary | ICD-10-CM

## 2021-05-14 DIAGNOSIS — R5381 Other malaise: Secondary | ICD-10-CM | POA: Diagnosis not present

## 2021-05-14 DIAGNOSIS — E43 Unspecified severe protein-calorie malnutrition: Secondary | ICD-10-CM

## 2021-05-14 DIAGNOSIS — Z91199 Patient's noncompliance with other medical treatment and regimen due to unspecified reason: Secondary | ICD-10-CM

## 2021-05-14 DIAGNOSIS — M159 Polyosteoarthritis, unspecified: Secondary | ICD-10-CM

## 2021-05-14 DIAGNOSIS — R269 Unspecified abnormalities of gait and mobility: Secondary | ICD-10-CM

## 2021-05-14 DIAGNOSIS — F332 Major depressive disorder, recurrent severe without psychotic features: Secondary | ICD-10-CM

## 2021-05-14 DIAGNOSIS — M150 Primary generalized (osteo)arthritis: Secondary | ICD-10-CM

## 2021-05-14 DIAGNOSIS — E039 Hypothyroidism, unspecified: Secondary | ICD-10-CM | POA: Diagnosis not present

## 2021-05-14 DIAGNOSIS — I2699 Other pulmonary embolism without acute cor pulmonale: Secondary | ICD-10-CM

## 2021-05-14 NOTE — Progress Notes (Addendum)
Subjective:  Patient ID: [name], female    DOB: 01/25/1930  Age: 88 y.o. MRN: [id number]  CC: No chief complaint on file.   HPI [name] presents for a follow-up.  The patient was in the hospital with multiple medical problems in August of 2022, then discharged to a rehab facility.  She was subsequently discharged and currently is residing with her son Bland Span.  She is here w/her son Bland Span who helps with history. She has been back at [facility] home x 3 weeks; doing very well.  She is not taking any meds.  They would like to start physical and occupational therapy at all.  Off all meds including Eliquis, fluoxetine and others.  They are practicing faith, good nutrition and physical activity.  Outpatient Medications Prior to Visit  Medication Sig Dispense Refill   divalproex (DEPAKOTE SPRINKLE) 125 MG capsule Take 1 capsule (125 mg total) by mouth 2 (two) times daily. (Patient not taking: Reported on 05/14/2021)     ELIQUIS 2.5 MG TABS tablet Take 2.5 mg by mouth 2 (two) times daily. (Patient not taking: Reported on 05/14/2021)     FLUoxetine (PROZAC) 20 MG capsule Take 20 mg by mouth daily. (Patient not taking: Reported on 05/14/2021)     levothyroxine (SYNTHROID) 88 MCG tablet Take 88 mcg by mouth daily before breakfast. (Patient not taking: Reported on 05/14/2021)     melatonin 5 MG TABS Take 5 mg by mouth at bedtime. (Patient not taking: Reported on 05/14/2021)     Multiple Vitamin (MULTIVITAMIN WITH MINERALS) TABS tablet Take 1 tablet by mouth daily. (Patient not taking: Reported on 05/14/2021)     pantoprazole (PROTONIX) 40 MG tablet Take 1 tablet (40 mg total) by mouth daily. (Patient not taking: Reported on 05/14/2021) 30 tablet 0   traZODone (DESYREL) 50 MG tablet Take 25-50 mg by mouth at bedtime as needed for sleep. (Patient not taking: Reported on 05/14/2021)     ascorbic acid (VITAMIN C) 250 MG tablet Take 1 tablet (250 mg total) by mouth 2 (two) times daily. (Patient not taking: No  sig reported)     No facility-administered medications prior to visit.    ROS: Review of Systems  Constitutional:  Positive for fatigue. Negative for activity change, appetite change, chills and unexpected weight change.  HENT:  Negative for congestion, mouth sores and sinus pressure.   Eyes:  Negative for visual disturbance.  Respiratory:  Negative for cough and chest tightness.   Gastrointestinal:  Negative for abdominal pain and nausea.  Genitourinary:  Negative for difficulty urinating, frequency and vaginal pain.  Musculoskeletal:  Negative for back pain and gait problem.  Skin:  Negative for pallor and rash.  Neurological:  Positive for weakness. Negative for dizziness, tremors, numbness and headaches.  Psychiatric/Behavioral:  Positive for decreased concentration. Negative for confusion, dysphoric mood, sleep disturbance and suicidal ideas. The patient is not nervous/anxious.    Objective:  BP 120/80 (BP Location: Left Arm, Patient Position: Sitting, Cuff Size: Large)    Pulse 75    Temp 98.3 F (36.8 C) (Oral)    Ht 5\' 4"  (1.626 m)    Wt 102 lb (46.3 kg)    SpO2 99%    BMI 17.51 kg/m   BP Readings from Last 3 Encounters:  05/14/21 120/80  02/04/21 108/68  01/06/21 130/66    Wt Readings from Last 3 Encounters:  05/14/21 102 lb (46.3 kg)  01/06/21 105 lb 13.1 oz (48 kg)  11/26/20 106 lb 4.2  oz (48.2 kg)    Physical Exam Constitutional:      General: She is not in acute distress.    Appearance: Normal appearance. She is well-developed.  HENT:     Head: Normocephalic.     Right Ear: External ear normal.     Left Ear: External ear normal.     Nose: Nose normal.  Eyes:     General:        Right eye: No discharge.        Left eye: No discharge.     Conjunctiva/sclera: Conjunctivae normal.     Pupils: Pupils are equal, round, and reactive to light.  Neck:     Thyroid: No thyromegaly.     Vascular: No JVD.     Trachea: No tracheal deviation.  Cardiovascular:      Rate and Rhythm: Normal rate and regular rhythm.     Heart sounds: Normal heart sounds.  Pulmonary:     Effort: No respiratory distress.     Breath sounds: No stridor. No wheezing.  Abdominal:     General: Bowel sounds are normal. There is no distension.     Palpations: Abdomen is soft. There is no mass.     Tenderness: There is no abdominal tenderness. There is no guarding or rebound.  Musculoskeletal:        General: No tenderness.     Cervical back: Normal range of motion and neck supple. No rigidity.     Right lower leg: No edema.     Left lower leg: No edema.  Lymphadenopathy:     Cervical: No cervical adenopathy.  Skin:    Findings: No erythema or rash.  Neurological:     Cranial Nerves: No cranial nerve deficit.     Motor: No abnormal muscle tone.     Coordination: Coordination abnormal.     Gait: Gait abnormal.     Deep Tendon Reflexes: Reflexes normal.  Psychiatric:        Behavior: Behavior normal.        Thought Content: Thought content normal.        Judgment: Judgment normal.  Ataxic Alert and cooperative The patient looks very well.  She is well-groomed, well-nourished.    A total time of 45 minutes was spent preparing to see the patient, reviewing tests, x-rays, medical records.  Also, obtaining history and performing comprehensive physical exam.  Additionally, counseling the patient regarding the above listed issues.   Finally, documenting clinical information in the health records, coordination of care.we discussed her chronic medical problems and the refusal of taking previously prescribed meds with the patient and her son Bland Span.  There is certain risks of taking this strategy.  However, I have to admit that the patient is doing well and looks better than in a long time.  It is a complex case in several aspects.   Lab Results  Component Value Date   WBC 4.7 01/06/2021   HGB 11.8 (L) 01/06/2021   HCT 36.8 01/06/2021   PLT 217 01/06/2021   GLUCOSE 107 (H)  01/06/2021   CHOL 174 05/11/2010   TRIG 78.0 05/11/2010   HDL 52.30 05/11/2010   LDLDIRECT 130.8 09/30/2006   LDLCALC 106 (H) 05/11/2010   ALT 11 01/06/2021   AST 18 01/06/2021   NA 144 01/06/2021   K 3.1 (L) 01/06/2021   CL 101 01/06/2021   CREATININE 0.72 01/06/2021   BUN 23 01/06/2021   CO2 28 01/06/2021   TSH 2.390 05/09/2020  INR 1.08 06/30/2016   HGBA1C 6.0 04/10/2012    No results found.  Assessment & Plan:   Problem List Items Addressed This Visit     Gait disorder - Primary    Multifactorial.  Start PT/OT at home      Relevant Orders   Ambulatory referral to [place]   Hypothyroidism    She is not taking Levothroid.  Refused lab tests.      Moderately severe recurrent major depression ([place])    Off all meds including  fluoxetine and others.  They are practicing faith, good nutrition and physical activity under her son Isaac's supervision.  I have to admit that she looks better than ever in a long time.      Noncompliance    Refusal of taking previously prescribed meds with the patient and her son Bland Span.  There is certain risks of taking this strategy.  However, I have to admit that the patient is doing well and looks better than in a long time.       Osteoarthritis    Start PT      Osteoporosis    Start PT      PE (pulmonary thromboembolism) ([place])     She is not taking Eliquis.  Refused lab tests. Risks associated with treatment noncompliance were discussed with the patient and her son.      Protein-calorie malnutrition, severe    She appears well-nourished now      Other Visit Diagnoses     Physical deconditioning       Relevant Orders   Ambulatory referral to [place]         No orders of the defined types were placed in this encounter.     Follow-up: Return in about 3 months (around 08/12/2021) for a follow-up visit.  Walker Kehr, MD

## 2021-05-19 DIAGNOSIS — Z91199 Patient's noncompliance with other medical treatment and regimen due to unspecified reason: Secondary | ICD-10-CM | POA: Insufficient documentation

## 2021-05-19 DIAGNOSIS — R269 Unspecified abnormalities of gait and mobility: Secondary | ICD-10-CM | POA: Insufficient documentation

## 2021-05-19 NOTE — Assessment & Plan Note (Signed)
She is not taking Levothroid.  Refused lab tests.

## 2021-05-19 NOTE — Assessment & Plan Note (Signed)
Off all meds including  fluoxetine and others.  They are practicing faith, good nutrition and physical activity under her son [name]'s supervision.  I have to admit that she looks better than ever in a long time.

## 2021-05-19 NOTE — Assessment & Plan Note (Signed)
Multifactorial.  Start PT/OT at home

## 2021-05-19 NOTE — Assessment & Plan Note (Signed)
She appears well-nourished now

## 2021-05-19 NOTE — Assessment & Plan Note (Signed)
°  She is not taking Eliquis.  Refused lab tests. Risks associated with treatment noncompliance were discussed with the patient and her son.

## 2021-05-19 NOTE — Assessment & Plan Note (Signed)
Refusal of taking previously prescribed meds with the patient and her son Bland Span.  There is certain risks of taking this strategy.  However, I have to admit that the patient is doing well and looks better than in a long time.

## 2021-05-21 ENCOUNTER — Telehealth: Payer: Self-pay

## 2021-05-21 ENCOUNTER — Telehealth (HOSPITAL_COMMUNITY): Payer: Self-pay | Admitting: *Deleted

## 2021-05-21 NOTE — Telephone Encounter (Signed)
Called [name] back to get clarification on msg below.[name] states fr insurance to cover home health service MD need to addendum last ov and add that pt had Osteoarthritis in both knees. Pt does have osteoporosis as well, but will need dx on note. Inform [name] will forward msg and he will addendum, and will give her a call once he send back.Marland KitchenJohny Chess

## 2021-05-21 NOTE — Telephone Encounter (Signed)
Writer reached out to pt regarding whether or not see would like to see dr. Adele Schilder again, or would like office to close her out as her last appointment was on 03/13/22 VM left.

## 2021-05-21 NOTE — Telephone Encounter (Signed)
[name] is calling to get a covered medical Dx for [facility].  She is requesting a call back to give the ok. These are the examples she said was appropriate to use:  Osteoarthritis of both knees OSTEOPOROSIS CFS (chronic fatigue syndrome) CFS  [name] number is [phone]

## 2021-05-22 NOTE — Telephone Encounter (Signed)
Notified Angela MD addend last ov,,/lmb

## 2021-05-22 NOTE — Telephone Encounter (Signed)
Okay I have changed the diagnosis.  Thanks

## 2021-05-26 NOTE — Telephone Encounter (Signed)
Pt's son states Levada Dy contacted him and stated she has not received the provider's  assessment for the insurance claim   Caller requesting a c/b  Bland Span [phone]

## 2021-05-27 NOTE — Telephone Encounter (Signed)
Levada Dy from [facility] calling in requesting to speak w/ nurse about patient  Please cb [phone]

## 2021-05-27 NOTE — Telephone Encounter (Signed)
Probable sure what this is about.  Home PT referral was placed sometime ago.  Thanks

## 2021-05-28 NOTE — Telephone Encounter (Signed)
Spoke with [name] via community msg.. MD added osteoporosis to last visit.Marland KitchenJohny Chess

## 2021-05-29 NOTE — Telephone Encounter (Signed)
Levada Dy keep calling and sending community msg concerning adding diagnosis to the note of 05/14/21. MD added to the problem list instead of actual note. Need OA and Osteoporosis on OV note. She  did say that they are going out to see the pt tomorrow, but for insurance to cover MD need to add to office note. Inform Levada Dy will send msg back once its been done.Marland KitchenJohny Chess

## 2021-05-29 NOTE — Assessment & Plan Note (Signed)
Start PT 

## 2021-05-29 NOTE — Telephone Encounter (Signed)
Done. Thx.

## 2021-06-01 ENCOUNTER — Telehealth: Payer: Self-pay

## 2021-06-01 NOTE — Telephone Encounter (Signed)
Amy is calling with the Plan of care frequency: 2 times a week for 4 weeks  1 times a week for 3 weeks  CB [phone]

## 2021-06-02 NOTE — Telephone Encounter (Signed)
OK. Thx

## 2021-06-02 NOTE — Telephone Encounter (Signed)
Caller checking status of verbal orders  Advised caller provider just returned today.

## 2021-06-03 NOTE — Telephone Encounter (Signed)
Verbal ok given to Amy.

## 2021-06-26 ENCOUNTER — Telehealth: Payer: Self-pay | Admitting: Internal Medicine

## 2021-06-26 NOTE — Telephone Encounter (Signed)
Monique with [facility] calls today needing verbals for PT.  PT needs O.K.'d for occupational therapy evaluation and treatment for ADL needs! ? ?CB: [phone] ?

## 2021-06-26 NOTE — Telephone Encounter (Signed)
Verbal orders given for PT. 

## 2021-07-01 ENCOUNTER — Telehealth: Payer: Self-pay | Admitting: Internal Medicine

## 2021-07-01 NOTE — Telephone Encounter (Signed)
[name] is calling back to report to that the son states that he has taking pt off all of her medications and also to see if an grab bar can be order for shower. ? ?FYI ?  ?

## 2021-07-01 NOTE — Telephone Encounter (Signed)
Vicente Males with [facility] calls today with an [place] regarding PT. Physical therapists took all precautions with helping PT in and out of shower with shower grab bar. PT's son is becoming hesitant of the physical therapists working with PT. Son states he has also taken PT off of all medications and has brought up God helping her instead. They are making attempts now to get up with the son to try and inform him. Would she be able to send out a Education officer, museum? ? ?CB: [phone] ?

## 2021-07-01 NOTE — Telephone Encounter (Signed)
Home Health verbal orders-caller/Agency: [name]/ [facility] ? ?Callback number: [phone] secure vm ? ?Requesting OT/PT/Skilled nursing/Social Work/Speech: OT ? ?Frequency: 1w1 2w3 1w1 ?

## 2021-07-02 NOTE — Telephone Encounter (Signed)
[name] has called back to check status of message.  ? ?States they have a Education officer, museum available as early as Architectural technologist.  ? ?

## 2021-07-03 NOTE — Telephone Encounter (Signed)
I am not sure I understand clear what the request is about. ? ?Last time I saw the patient and her son in the office she was doing very well off her meds and under her son's care.  I do not think they need to call a Education officer, museum, unless they see clear signs of abuse or neglect. ?Thanks ?

## 2021-07-03 NOTE — Telephone Encounter (Signed)
Okay prescription for grab bar for the shower.  Thanks ?

## 2021-07-03 NOTE — Telephone Encounter (Signed)
[name] calls again today asking for Dr.Plotnikov to please call and speak with [facility]'s occupational therapist [name]. [name] would like to reiterate that PT's son believes that the physical therapy is making PT's strength regress ? ?CB for [name]: [phone] ?

## 2021-07-06 NOTE — Telephone Encounter (Signed)
LVM for [name] to call clinic back to give more detail on what is needed from Dr. Alain Marion. ?

## 2021-07-07 DIAGNOSIS — I081 Rheumatic disorders of both mitral and tricuspid valves: Secondary | ICD-10-CM

## 2021-07-07 DIAGNOSIS — I951 Orthostatic hypotension: Secondary | ICD-10-CM

## 2021-07-07 DIAGNOSIS — R262 Difficulty in walking, not elsewhere classified: Secondary | ICD-10-CM

## 2021-07-07 DIAGNOSIS — I7121 Aneurysm of the ascending aorta, without rupture: Secondary | ICD-10-CM

## 2021-07-07 DIAGNOSIS — Z9181 History of falling: Secondary | ICD-10-CM

## 2021-07-07 DIAGNOSIS — F0284 Dementia in other diseases classified elsewhere, unspecified severity, with anxiety: Secondary | ICD-10-CM

## 2021-07-07 DIAGNOSIS — Z9013 Acquired absence of bilateral breasts and nipples: Secondary | ICD-10-CM

## 2021-07-07 DIAGNOSIS — Z950 Presence of cardiac pacemaker: Secondary | ICD-10-CM

## 2021-07-07 DIAGNOSIS — J4530 Mild persistent asthma, uncomplicated: Secondary | ICD-10-CM

## 2021-07-07 DIAGNOSIS — F429 Obsessive-compulsive disorder, unspecified: Secondary | ICD-10-CM

## 2021-07-07 DIAGNOSIS — I509 Heart failure, unspecified: Secondary | ICD-10-CM

## 2021-07-07 DIAGNOSIS — Z853 Personal history of malignant neoplasm of breast: Secondary | ICD-10-CM

## 2021-07-07 DIAGNOSIS — R001 Bradycardia, unspecified: Secondary | ICD-10-CM

## 2021-07-07 DIAGNOSIS — I272 Pulmonary hypertension, unspecified: Secondary | ICD-10-CM

## 2021-07-07 DIAGNOSIS — M109 Gout, unspecified: Secondary | ICD-10-CM

## 2021-07-07 DIAGNOSIS — Z87440 Personal history of urinary (tract) infections: Secondary | ICD-10-CM

## 2021-07-07 DIAGNOSIS — E43 Unspecified severe protein-calorie malnutrition: Secondary | ICD-10-CM

## 2021-07-07 DIAGNOSIS — N183 Chronic kidney disease, stage 3 unspecified: Secondary | ICD-10-CM

## 2021-07-07 DIAGNOSIS — F0283 Dementia in other diseases classified elsewhere, unspecified severity, with mood disturbance: Secondary | ICD-10-CM

## 2021-07-07 DIAGNOSIS — I2510 Atherosclerotic heart disease of native coronary artery without angina pectoris: Secondary | ICD-10-CM

## 2021-07-07 DIAGNOSIS — I495 Sick sinus syndrome: Secondary | ICD-10-CM

## 2021-07-07 DIAGNOSIS — F332 Major depressive disorder, recurrent severe without psychotic features: Secondary | ICD-10-CM

## 2021-07-07 DIAGNOSIS — I4891 Unspecified atrial fibrillation: Secondary | ICD-10-CM

## 2021-07-07 DIAGNOSIS — M150 Primary generalized (osteo)arthritis: Secondary | ICD-10-CM | POA: Diagnosis not present

## 2021-07-07 DIAGNOSIS — F411 Generalized anxiety disorder: Secondary | ICD-10-CM

## 2021-07-07 DIAGNOSIS — E039 Hypothyroidism, unspecified: Secondary | ICD-10-CM | POA: Diagnosis not present

## 2021-07-07 DIAGNOSIS — I2699 Other pulmonary embolism without acute cor pulmonale: Secondary | ICD-10-CM | POA: Diagnosis not present

## 2021-07-07 DIAGNOSIS — I130 Hypertensive heart and chronic kidney disease with heart failure and stage 1 through stage 4 chronic kidney disease, or unspecified chronic kidney disease: Secondary | ICD-10-CM

## 2021-07-07 DIAGNOSIS — M810 Age-related osteoporosis without current pathological fracture: Secondary | ICD-10-CM | POA: Diagnosis not present

## 2021-07-13 NOTE — Telephone Encounter (Signed)
Misty from [facility] calling in ? ?Wanted to notify provider of 2 missed visits w/ patient ? ?03/17 patient son refused visit ?03/20 unable to reach patient son via phone call or text ?

## 2021-07-15 ENCOUNTER — Telehealth: Payer: Self-pay | Admitting: Internal Medicine

## 2021-07-15 NOTE — Telephone Encounter (Signed)
Leanne with [facility] calls today in regards to discharging PT. [facility] had been having issues reaching PT (both through calls and through visits) and were told by son that they were "just wanting to pray" for him and PT. They were able to reach him today and he had then requested services from [facility] that were out of their ability (services requiring a nurse be there for up to 3 hours a day everyday) . PT will be discharged by the end of 07/16/2021 ?

## 2021-07-16 NOTE — Telephone Encounter (Signed)
Noted. Thx.

## 2021-11-14 ENCOUNTER — Inpatient Hospital Stay (HOSPITAL_COMMUNITY): Payer: Medicare Other

## 2021-11-14 ENCOUNTER — Emergency Department (HOSPITAL_COMMUNITY): Payer: Medicare Other

## 2021-11-14 ENCOUNTER — Inpatient Hospital Stay (HOSPITAL_COMMUNITY)
Admission: EM | Admit: 2021-11-14 | Discharge: 2021-11-18 | DRG: 378 | Disposition: A | Discharge status: Home Health | Payer: Medicare Other | Attending: Internal Medicine | Admitting: Internal Medicine

## 2021-11-14 ENCOUNTER — Other Ambulatory Visit: Payer: Self-pay

## 2021-11-14 DIAGNOSIS — Z8041 Family history of malignant neoplasm of ovary: Secondary | ICD-10-CM

## 2021-11-14 DIAGNOSIS — K5731 Diverticulosis of large intestine without perforation or abscess with bleeding: Secondary | ICD-10-CM | POA: Diagnosis present

## 2021-11-14 DIAGNOSIS — D62 Acute posthemorrhagic anemia: Secondary | ICD-10-CM | POA: Diagnosis present

## 2021-11-14 DIAGNOSIS — Z79890 Hormone replacement therapy (postmenopausal): Secondary | ICD-10-CM | POA: Diagnosis not present

## 2021-11-14 DIAGNOSIS — E039 Hypothyroidism, unspecified: Secondary | ICD-10-CM | POA: Diagnosis present

## 2021-11-14 DIAGNOSIS — M8580 Other specified disorders of bone density and structure, unspecified site: Secondary | ICD-10-CM | POA: Diagnosis present

## 2021-11-14 DIAGNOSIS — Z7901 Long term (current) use of anticoagulants: Secondary | ICD-10-CM

## 2021-11-14 DIAGNOSIS — L930 Discoid lupus erythematosus: Secondary | ICD-10-CM | POA: Diagnosis present

## 2021-11-14 DIAGNOSIS — G4733 Obstructive sleep apnea (adult) (pediatric): Secondary | ICD-10-CM | POA: Diagnosis present

## 2021-11-14 DIAGNOSIS — H409 Unspecified glaucoma: Secondary | ICD-10-CM | POA: Diagnosis present

## 2021-11-14 DIAGNOSIS — M109 Gout, unspecified: Secondary | ICD-10-CM | POA: Diagnosis present

## 2021-11-14 DIAGNOSIS — Z974 Presence of external hearing-aid: Secondary | ICD-10-CM | POA: Diagnosis not present

## 2021-11-14 DIAGNOSIS — Z888 Allergy status to other drugs, medicaments and biological substances status: Secondary | ICD-10-CM

## 2021-11-14 DIAGNOSIS — Z79899 Other long term (current) drug therapy: Secondary | ICD-10-CM | POA: Diagnosis not present

## 2021-11-14 DIAGNOSIS — I10 Essential (primary) hypertension: Secondary | ICD-10-CM | POA: Diagnosis present

## 2021-11-14 DIAGNOSIS — Z1330 Encounter for screening examination for mental health and behavioral disorders, unspecified: Secondary | ICD-10-CM | POA: Diagnosis not present

## 2021-11-14 DIAGNOSIS — Z9013 Acquired absence of bilateral breasts and nipples: Secondary | ICD-10-CM

## 2021-11-14 DIAGNOSIS — E739 Lactose intolerance, unspecified: Secondary | ICD-10-CM | POA: Diagnosis present

## 2021-11-14 DIAGNOSIS — M1990 Unspecified osteoarthritis, unspecified site: Secondary | ICD-10-CM | POA: Diagnosis present

## 2021-11-14 DIAGNOSIS — Z950 Presence of cardiac pacemaker: Secondary | ICD-10-CM | POA: Diagnosis not present

## 2021-11-14 DIAGNOSIS — Z86711 Personal history of pulmonary embolism: Secondary | ICD-10-CM

## 2021-11-14 DIAGNOSIS — Z8249 Family history of ischemic heart disease and other diseases of the circulatory system: Secondary | ICD-10-CM

## 2021-11-14 DIAGNOSIS — Z853 Personal history of malignant neoplasm of breast: Secondary | ICD-10-CM

## 2021-11-14 DIAGNOSIS — E876 Hypokalemia: Secondary | ICD-10-CM | POA: Diagnosis present

## 2021-11-14 DIAGNOSIS — K625 Hemorrhage of anus and rectum: Principal | ICD-10-CM

## 2021-11-14 DIAGNOSIS — Z823 Family history of stroke: Secondary | ICD-10-CM

## 2021-11-14 DIAGNOSIS — K219 Gastro-esophageal reflux disease without esophagitis: Secondary | ICD-10-CM | POA: Diagnosis present

## 2021-11-14 LAB — CBC WITH DIFFERENTIAL/PLATELET
Abs Immature Granulocytes: 0 10*3/uL (ref 0.00–0.07)
Basophils Absolute: 0 10*3/uL (ref 0.0–0.1)
Basophils Relative: 0 %
Eosinophils Absolute: 0.1 10*3/uL (ref 0.0–0.5)
Eosinophils Relative: 1 %
HCT: 28.9 % — ABNORMAL LOW (ref 36.0–46.0)
Hemoglobin: 9.2 g/dL — ABNORMAL LOW (ref 12.0–15.0)
Immature Granulocytes: 0 %
Lymphocytes Relative: 24 %
Lymphs Abs: 1.2 10*3/uL (ref 0.7–4.0)
MCH: 33.2 pg (ref 26.0–34.0)
MCHC: 31.8 g/dL (ref 30.0–36.0)
MCV: 104.3 fL — ABNORMAL HIGH (ref 80.0–100.0)
Monocytes Absolute: 0.5 10*3/uL (ref 0.1–1.0)
Monocytes Relative: 10 %
Neutro Abs: 3.4 10*3/uL (ref 1.7–7.7)
Neutrophils Relative %: 65 %
Platelets: 244 10*3/uL (ref 150–400)
RBC: 2.77 MIL/uL — ABNORMAL LOW (ref 3.87–5.11)
RDW: 14.2 % (ref 11.5–15.5)
WBC: 5.2 10*3/uL (ref 4.0–10.5)
nRBC: 0 % (ref 0.0–0.2)

## 2021-11-14 LAB — TROPONIN I (HIGH SENSITIVITY)
Troponin I (High Sensitivity): 4 ng/L (ref <18)
Troponin I (High Sensitivity): 6 ng/L (ref <18)

## 2021-11-14 LAB — COMPREHENSIVE METABOLIC PANEL
ALT: 9 U/L (ref 0–44)
AST: 13 U/L — ABNORMAL LOW (ref 15–41)
Albumin: 2 g/dL — ABNORMAL LOW (ref 3.5–5.0)
Alkaline Phosphatase: 37 U/L — ABNORMAL LOW (ref 38–126)
Anion gap: 5 (ref 5–15)
BUN: 20 mg/dL (ref 8–23)
CO2: 19 mmol/L — ABNORMAL LOW (ref 22–32)
Calcium: 5.9 mg/dL — CL (ref 8.9–10.3)
Chloride: 117 mmol/L — ABNORMAL HIGH (ref 98–111)
Creatinine, Ser: 0.47 mg/dL (ref 0.44–1.00)
GFR, Estimated: >60 mL/min (ref >60)
Glucose, Bld: 84 mg/dL (ref 70–99)
Potassium: 2.9 mmol/L — ABNORMAL LOW (ref 3.5–5.1)
Sodium: 141 mmol/L (ref 135–145)
Total Bilirubin: 0.6 mg/dL (ref 0.3–1.2)
Total Protein: 4 g/dL — ABNORMAL LOW (ref 6.5–8.1)

## 2021-11-14 LAB — LACTIC ACID, PLASMA: Lactic Acid, Venous: 0.9 mmol/L (ref 0.5–1.9)

## 2021-11-14 LAB — PROTIME-INR
INR: 1.2 (ref 0.8–1.2)
Prothrombin Time: 14.6 seconds (ref 11.4–15.2)

## 2021-11-14 LAB — APTT: aPTT: 29 seconds (ref 24–36)

## 2021-11-14 MED ORDER — ONDANSETRON HCL 4 MG PO TABS: 4.0000 mg | ORAL_TABLET | Freq: Four times a day (QID) | ORAL | Status: DC | PRN

## 2021-11-14 MED ORDER — POTASSIUM CHLORIDE 10 MEQ/100ML IV SOLN
10.0000 meq | Freq: Once | INTRAVENOUS | Status: AC
  Administered 2021-11-14: 10 meq via INTRAVENOUS
  Filled 2021-11-14: qty 100

## 2021-11-14 MED ORDER — LIDOCAINE HCL 1 % IJ SOLN
INTRAMUSCULAR | Status: AC
  Filled 2021-11-14: qty 20

## 2021-11-14 MED ORDER — IOHEXOL 350 MG/ML SOLN
100.0000 mL | Freq: Once | INTRAVENOUS | Status: AC | PRN
  Administered 2021-11-14: 100 mL via INTRAVENOUS

## 2021-11-14 MED ORDER — SODIUM CHLORIDE 0.9 % IV BOLUS
1000.0000 mL | Freq: Once | INTRAVENOUS | Status: AC
  Administered 2021-11-14: 1000 mL via INTRAVENOUS

## 2021-11-14 MED ORDER — MIDAZOLAM HCL 2 MG/2ML IJ SOLN
INTRAMUSCULAR | Status: AC
  Filled 2021-11-14: qty 2

## 2021-11-14 MED ORDER — SODIUM CHLORIDE (PF) 0.9 % IJ SOLN
INTRAMUSCULAR | Status: AC
  Filled 2021-11-14: qty 50

## 2021-11-14 MED ORDER — ACETAMINOPHEN 650 MG RE SUPP: 650.0000 mg | Freq: Four times a day (QID) | RECTAL | Status: DC | PRN

## 2021-11-14 MED ORDER — PANTOPRAZOLE SODIUM 40 MG IV SOLR
40.0000 mg | Freq: Once | INTRAVENOUS | Status: AC
  Administered 2021-11-14: 40 mg via INTRAVENOUS
  Filled 2021-11-14: qty 10

## 2021-11-14 MED ORDER — ONDANSETRON HCL 4 MG/2ML IJ SOLN
4.0000 mg | Freq: Four times a day (QID) | INTRAMUSCULAR | Status: DC | PRN
  Administered 2021-11-15 – 2021-11-16 (×2): 4 mg via INTRAVENOUS
  Filled 2021-11-14 (×2): qty 2

## 2021-11-14 MED ORDER — ACETAMINOPHEN 325 MG PO TABS
650.0000 mg | ORAL_TABLET | Freq: Four times a day (QID) | ORAL | Status: DC | PRN
  Administered 2021-11-16 – 2021-11-18 (×3): 650 mg via ORAL
  Filled 2021-11-14 (×3): qty 2

## 2021-11-14 MED ORDER — FENTANYL CITRATE (PF) 100 MCG/2ML IJ SOLN
INTRAMUSCULAR | Status: AC
  Filled 2021-11-14: qty 2

## 2021-11-14 MED ORDER — SODIUM CHLORIDE 0.9 % IV SOLN: INTRAVENOUS | Status: DC

## 2021-11-14 NOTE — H&P (Shared)
History and Physical    Patient: [name] UXN:[id number] DOB: 18-Dec-1929 DOA: 11/14/2021 DOS: the patient was seen and examined on 11/14/2021 PCP: Plotnikov, Evie Lacks, MD  Patient coming from: Home  Chief Complaint:  Chief Complaint  Patient presents with   Rectal Bleeding   HPI: [name] is a 88 y.o. female with medical history significant of HTN, PPM, hypothyroidism, prior PE.  Pt presents to ED with BRBPR.  Not on blood thinners other than ASA.  No CP, no abd pain, no N/V.  Symptoms onset earlier today.  Amount of blood seems to be decreasing since onset.    Review of Systems: As mentioned in the history of present illness. All other systems reviewed and are negative. Past Medical History:  Diagnosis Date   Anxiety    Bradycardia    Breast cancer, left breast ([place]) 1995   Breast cancer, right breast ([place]) 2002   CHF (congestive heart failure) (HCC)    Depression    Dr Cheryln Manly   Discoid lupus    skin   Full dentures    GERD (gastroesophageal reflux disease)    Glaucoma    Gout    Hx of echocardiogram    a.  Echocardiogram (03/2011): Mild LVH, EF 25-42%, grade 1 diastolic dysfunction, mild MR, mild to moderate TR, PASP 42;  b. Echo (05/2013):  Mod LVH, EF 65-70%, no WMA, Gr 1 DD, mild MR, mod TR, PASP 39   Hx of exercise stress test    a. ETT-echocardiogram (06/08/11): Sub-optimal exercise. Test stopped early due to dizziness and hypotension. EF 60%.  Non-diagnostic.;   b. Lexiscan Myoview (06/2013):  Diaphragmatic attenuation, no ischemia, EF 61%.  Low Risk   Hypertension    Hypothyroidism    IBS (irritable bowel syndrome)    Incontinence    OSA (obstructive sleep apnea) 07/20/2016   Mild with AHI 12/hr now on CPAP   Osteoarthritis    Osteopenia    Pacemaker 2009   Brady//Chronotropic incompetence with normal pacemaker function   Thyroid disease    Wears hearing aid    both ears   Past Surgical History:  Procedure Laterality Date   BREAST BIOPSY Left 1995    BREAST LUMPECTOMY Left 1995   axillary node dissectoin   CARDIAC CATHETERIZATION N/A 02/13/2016   Procedure: Right Heart Cath;  Surgeon: Larey Dresser, MD;  Location: [facility];  Service: Cardiovascular;  Laterality: N/A;   CATARACT EXTRACTION W/ INTRAOCULAR LENS  IMPLANT, BILATERAL Bilateral 06/2009 - 12.2915   left - right   DORSAL COMPARTMENT RELEASE  2000   left   HAMMER TOE SURGERY Left 2004   HEMORRHOID BANDING     INSERT / REPLACE / REMOVE PACEMAKER  2009   KNEE ARTHROSCOPY Left 08/29/2012   Procedure: LEFT KNEE ARTHROSCOPY ;  Surgeon: Hessie Dibble, MD;  Location: [facility];  Service: Orthopedics;  Laterality: Left;   MASTECTOMY Bilateral 09/2000   nbx   PPM GENERATOR CHANGEOUT N/A 07/16/2019   Procedure: PPM GENERATOR CHANGEOUT;  Surgeon: Deboraha Sprang, MD;  Location: [facility];  Service: Cardiovascular;  Laterality: N/A;   TRIGGER FINGER RELEASE Right 07/31/2013   Procedure: EXCISE MASS RIGHT INDEX A-1 PLLLEY RELEASE A-1 RIGHT INDEX;  Surgeon: Cammie Sickle., MD;  Location: [facility];  Service: Orthopedics;  Laterality: Right;   VAGINAL HYSTERECTOMY     Social History:  reports that she has never smoked. She has never used  smokeless tobacco. She reports that she does not drink alcohol and does not use drugs.  Allergies  Allergen Reactions   Tilactase Diarrhea   Amlodipine Besy-Benazepril Hcl Swelling   Amlodipine Besylate Swelling   Clonidine Hydrochloride     REACTION: tired   Lactose Intolerance (Gi) Diarrhea and Other (See Comments)   Valsartan Other (See Comments)    Tongue swelling    Verapamil Other (See Comments)    unknown    Family History  Problem Relation Age of Onset   Ovarian cancer Mother    Heart disease Father    Stroke Sister    Stroke Brother    Cancer Brother    Heart disease Brother     Prior to Admission medications   Medication Sig Start Date End Date Taking? Authorizing Provider   divalproex (DEPAKOTE SPRINKLE) 125 MG capsule Take 1 capsule (125 mg total) by mouth 2 (two) times daily. Patient not taking: Reported on 05/14/2021 12/01/20   Nolberto Hanlon, MD  ELIQUIS 2.5 MG TABS tablet Take 2.5 mg by mouth 2 (two) times daily. Patient not taking: Reported on 05/14/2021 10/03/20   [provider]  FLUoxetine (PROZAC) 20 MG capsule Take 20 mg by mouth daily. Patient not taking: Reported on 05/14/2021 11/24/20   [provider]  levothyroxine (SYNTHROID) 88 MCG tablet Take 88 mcg by mouth daily before breakfast. Patient not taking: Reported on 05/14/2021    [provider]  melatonin 5 MG TABS Take 5 mg by mouth at bedtime. Patient not taking: Reported on 05/14/2021    [provider]  Multiple Vitamin (MULTIVITAMIN WITH MINERALS) TABS tablet Take 1 tablet by mouth daily. Patient not taking: Reported on 05/14/2021 12/02/20   Nolberto Hanlon, MD  pantoprazole (PROTONIX) 40 MG tablet Take 1 tablet (40 mg total) by mouth daily. Patient not taking: Reported on 05/14/2021 07/31/20   Virgel Manifold, MD  traZODone (DESYREL) 50 MG tablet Take 25-50 mg by mouth at bedtime as needed for sleep. Patient not taking: Reported on 05/14/2021 09/23/20   [provider]    Physical Exam: Vitals:   11/14/21 2130 11/14/21 2228 11/14/21 2230 11/14/21 2300  BP: (!) 156/80  133/80 123/83  Pulse: 67  62 61  Resp: '10  19 20  '$ Temp:  97.7 F (36.5 C)    TempSrc:  Oral    SpO2: 99%  100% 100%  Weight:      Height:       Constitutional: NAD, calm, comfortable Eyes: PERRL, lids and conjunctivae normal ENMT: Mucous membranes are moist. Posterior pharynx clear of any exudate or lesions.Normal dentition.  Neck: normal, supple, no masses, no thyromegaly Respiratory: clear to auscultation bilaterally, no wheezing, no crackles. Normal respiratory effort. No accessory muscle use.  Cardiovascular: Regular rate and rhythm, no murmurs / rubs / gallops. No extremity edema.  2+ pedal pulses. No carotid bruits.  Abdomen: no tenderness, no masses palpated. No hepatosplenomegaly. Bowel sounds positive.  Musculoskeletal: no clubbing / cyanosis. No joint deformity upper and lower extremities. Good ROM, no contractures. Normal muscle tone.  Skin: no rashes, lesions, ulcers. No induration Neurologic: CN 2-12 grossly intact. Sensation intact, DTR normal. Strength 5/5 in all 4.  Psychiatric: Mild confusion.  Data Reviewed:       Latest Ref Rng & Units 11/14/2021   11:48 PM 11/14/2021    7:17 PM 01/06/2021   12:26 PM  CBC  WBC 4.0 - 10.5 K/uL 5.8  5.2  4.7   Hemoglobin 12.0 -  15.0 g/dL 8.9  9.2  11.8   Hematocrit 36.0 - 46.0 % 27.2  28.9  36.8   Platelets 150 - 400 K/uL 268  244  217       Latest Ref Rng & Units 11/14/2021    7:17 PM 01/06/2021   12:26 PM 11/26/2020   10:15 AM  BMP  Glucose 70 - 99 mg/dL 84  107  86   BUN 8 - 23 mg/dL 20  23  41   Creatinine 0.44 - 1.00 mg/dL 0.47  0.72  1.15   Sodium 135 - 145 mmol/L 141  144  145   Potassium 3.5 - 5.1 mmol/L 2.9  3.1  3.6   Chloride 98 - 111 mmol/L 117  101  106   CO2 22 - 32 mmol/L '19  28  25   '$ Calcium 8.9 - 10.3 mg/dL 5.9  8.5  9.2      Assessment and Plan: * Diverticulosis of colon with hemorrhage Bleeding diverticula demonstrated in Ascending colon on CTA of ABD pelvis today! [facility] IR contacted, they came in and were going to attempt embolization. Family has decided to hold off on embolization at this time - hoping that she stops bleeding on her own tonight. Family still wants Full code if needed Family says to call them if she needs blood transfusion, but "she wont need a blood transfusion" Not a Jehovah witness. Did explain to family that risks of waiting include but not limited to worsening bleeding tonight and death. Reportedly not on eliquis anymore, just baby ASA daily.  ABLA (acute blood loss anemia) HGB of 9.2 today down from 11.8 baseline. However, I am not clear if this is accurate as I  suspect her BMP at least is diluted with normal saline. Repeat CBC stat and again in AM.  Hypocalcemia Question of Hypocalcemia and Hypokalemia. However, BMP looks like it might be a mix of normal saline + blood (other abnormalities on todays BMP suggestive of this include hyperchloremic NAG acidosis, and her renal function looks far better than it is at baseline)! Repeat BMP stat to verify if hypocalcemia and hypokalemia is real.  Asked for new stick.  Hypothyroidism Looks like shes not on any home meds including thyroid replacement at home at this point. Will put in for TSH.      Advance Care Planning:   Code Status: Full Code Per Son  Consults: IR  Family Communication: Spoke with Son Issac on phone  Severity of Illness: The appropriate patient status for this patient is INPATIENT. Inpatient status is judged to be reasonable and necessary in order to provide the required intensity of service to ensure the patient's safety. The patient's presenting symptoms, physical exam findings, and initial radiographic and laboratory data in the context of their chronic comorbidities is felt to place them at high risk for further clinical deterioration. Furthermore, it is not anticipated that the patient will be medically stable for discharge from the hospital within 2 midnights of admission.   * I certify that at the point of admission it is my clinical judgment that the patient will require inpatient hospital care spanning beyond 2 midnights from the point of admission due to high intensity of service, high risk for further deterioration and high frequency of surveillance required.*  Author: Etta Quill., DO 11/14/2021 11:49 PM  For on call review [email or web address].

## 2021-11-14 NOTE — Assessment & Plan Note (Addendum)
Bleeding diverticula demonstrated in Ascending colon on CTA of ABD pelvis today! 1. NPO 2. IR contacted, they came in and were going to attempt embolization. 1. Family has decided to hold off on embolization at this time - hoping that she stops bleeding on her own tonight. 2. Family still wants Full code if needed 3. Family says to call them if she needs blood transfusion, but "she wont need a blood transfusion" 1. Not a Jehovah witness. 4. Did explain to family that risks of waiting include but not limited to worsening bleeding tonight and death. 3. Reportedly not on eliquis anymore, just baby ASA daily.

## 2021-11-14 NOTE — ED Triage Notes (Signed)
Pt BIBA from home. Pt report rectal/vaginal bleeding for 1x day. 3x grapefruit sized clots were found. Pt had syncopal event at home while EMS present.   Aox4  Pt states they didn't want to worry their son.  BP: 100/64 HR: 60 SPO2: 98  Given 250 NS

## 2021-11-14 NOTE — ED Provider Notes (Signed)
[facility] DEPT Provider Note   CSN: [id number] Arrival date & time: 11/14/21  1758     History {Add pertinent medical, surgical, social history, OB history to HPI:1} Chief Complaint  Patient presents with   Rectal Bleeding    [name] is a 88 y.o. female.  Patient presents via EMS for bleeding from her rectum and/or vagina.  She is not certain where it is coming from.  She reports having bright red blood coming from her rectum today but believes it could be coming from her vagina.  She denies any blood thinner use other than aspirin.  She feels weak and lightheaded.  Denies any chest pain.  Denies any abdominal pain, nausea or vomiting.  No fever. She does have Eliquis on her medication list but is reportedly not taking this anymore.  The history is provided by the patient and the EMS personnel.  Rectal Bleeding Associated symptoms: abdominal pain   Associated symptoms: no dizziness, no fever and no vomiting        Home Medications Prior to Admission medications   Medication Sig Start Date End Date Taking? Authorizing Provider  divalproex (DEPAKOTE SPRINKLE) 125 MG capsule Take 1 capsule (125 mg total) by mouth 2 (two) times daily. Patient not taking: Reported on 05/14/2021 12/01/20   Nolberto Hanlon, MD  ELIQUIS 2.5 MG TABS tablet Take 2.5 mg by mouth 2 (two) times daily. Patient not taking: Reported on 05/14/2021 10/03/20   [provider]  FLUoxetine (PROZAC) 20 MG capsule Take 20 mg by mouth daily. Patient not taking: Reported on 05/14/2021 11/24/20   [provider]  levothyroxine (SYNTHROID) 88 MCG tablet Take 88 mcg by mouth daily before breakfast. Patient not taking: Reported on 05/14/2021    [provider]  melatonin 5 MG TABS Take 5 mg by mouth at bedtime. Patient not taking: Reported on 05/14/2021    [provider]  Multiple Vitamin (MULTIVITAMIN WITH MINERALS) TABS tablet Take 1 tablet by mouth  daily. Patient not taking: Reported on 05/14/2021 12/02/20   Nolberto Hanlon, MD  pantoprazole (PROTONIX) 40 MG tablet Take 1 tablet (40 mg total) by mouth daily. Patient not taking: Reported on 05/14/2021 07/31/20   Virgel Manifold, MD  traZODone (DESYREL) 50 MG tablet Take 25-50 mg by mouth at bedtime as needed for sleep. Patient not taking: Reported on 05/14/2021 09/23/20   [provider]      Allergies    Tilactase, Amlodipine besy-benazepril hcl, Amlodipine besylate, Clonidine hydrochloride, Lactose intolerance (gi), Valsartan, and Verapamil    Review of Systems   Review of Systems  Constitutional:  Negative for activity change, appetite change and fever.  HENT:  Negative for congestion.   Respiratory:  Negative for cough, chest tightness and shortness of breath.   Gastrointestinal:  Positive for abdominal pain, blood in stool and hematochezia. Negative for nausea and vomiting.  Genitourinary:  Negative for dysuria and hematuria.  Musculoskeletal:  Negative for arthralgias and myalgias.  Skin:  Negative for rash.  Neurological:  Positive for weakness. Negative for dizziness and headaches.   all other systems are negative except as noted in the HPI and PMH.    Physical Exam Updated Vital Signs BP 132/80   Pulse 61   Temp 98.3 F (36.8 C) (Oral)   Resp 16   SpO2 98%  Physical Exam Vitals and nursing note reviewed.  Constitutional:      General: She is not in acute distress.    Appearance: She  is well-developed. She is not ill-appearing.  HENT:     Head: Normocephalic and atraumatic.     Mouth/Throat:     Pharynx: No oropharyngeal exudate.  Eyes:     Conjunctiva/sclera: Conjunctivae normal.     Pupils: Pupils are equal, round, and reactive to light.  Neck:     Comments: No meningismus. Cardiovascular:     Rate and Rhythm: Normal rate and regular rhythm.     Heart sounds: Normal heart sounds. No murmur heard. Pulmonary:     Effort: Pulmonary effort is normal. No  respiratory distress.     Breath sounds: Normal breath sounds.  Abdominal:     Palpations: Abdomen is soft.     Tenderness: There is no abdominal tenderness. There is no guarding or rebound.  Genitourinary:    Comments: Chaperone present Production assistant, radio.  There is bright red blood throughout patient's perineum.  Bright red blood with clots on rectal exam as well as vaginal digital exam. Musculoskeletal:        General: No tenderness. Normal range of motion.     Cervical back: Normal range of motion and neck supple.  Skin:    General: Skin is warm.  Neurological:     Mental Status: She is alert and oriented to person, place, and time.     Cranial Nerves: No cranial nerve deficit.     Motor: No abnormal muscle tone.     Coordination: Coordination normal.     Comments:  5/5 strength throughout. CN 2-12 intact.Equal grip strength.   Psychiatric:        Behavior: Behavior normal.     ED Results / Procedures / Treatments   Labs (all labs ordered are listed, but only abnormal results are displayed) Labs Reviewed  COMPREHENSIVE METABOLIC PANEL  CBC WITH DIFFERENTIAL/PLATELET  PROTIME-INR  LACTIC ACID, PLASMA  APTT  TYPE AND SCREEN  TROPONIN I (HIGH SENSITIVITY)    EKG None  Radiology No results found.  Procedures .Critical Care  Performed by: Ezequiel Essex, MD Authorized by: Ezequiel Essex, MD   Critical care provider statement:    Critical care time (minutes):  60   Critical care time was exclusive of:  Separately billable procedures and treating other patients   Critical care was necessary to treat or prevent imminent or life-threatening deterioration of the following conditions: GI bleed.   Critical care was time spent personally by me on the following activities:  Development of treatment plan with patient or surrogate, discussions with consultants, evaluation of patient's response to treatment, examination of patient, ordering and review of laboratory studies, ordering  and review of radiographic studies, ordering and performing treatments and interventions, pulse oximetry, re-evaluation of patient's condition and review of old charts   I assumed direction of critical care for this patient from another provider in my specialty: no     Care discussed with: admitting provider     {Document cardiac monitor, telemetry assessment procedure when appropriate:1}  Medications Ordered in ED Medications  sodium chloride 0.9 % bolus 1,000 mL (has no administration in time range)    And  0.9 %  sodium chloride infusion (has no administration in time range)  pantoprazole (PROTONIX) injection 40 mg (has no administration in time range)    ED Course/ Medical Decision Making/ A&P                           Medical Decision Making Amount and/or Complexity of Data Reviewed  Labs: ordered. Decision-making details documented in ED Course. Radiology: ordered and independent interpretation performed. Decision-making details documented in ED Course. ECG/medicine tests: ordered and independent interpretation performed. Decision-making details documented in ED Course.  Risk Prescription drug management. Decision regarding hospitalization.   Patient with rectal and as well as possible vaginal bleeding.  Vitals are stable.  Hemoglobin 9 g down from 11.  Vitals remained stable.  She does have a pacemaker.  Son at bedside states that does not want any kind of blood transfusion or blood products.   CTA is remarkable for active area of bleeding to the ascending colon likely hemorrhagic diverticulum.  Discussed with Dr. Anselm Pancoast radiology who will evaluate and attempt embolization.  Vital signs remained stable.  Patient given Protonix.  {Document critical care time when appropriate:1} {Document review of labs and clinical decision tools ie heart score, Chads2Vasc2 etc:1}  {Document your independent review of radiology images, and any outside records:1} {Document your discussion  with family members, caretakers, and with consultants:1} {Document social determinants of health affecting pt's care:1} {Document your decision making why or why not admission, treatments were needed:1} Final Clinical Impression(s) / ED Diagnoses Final diagnoses:  None    Rx / DC Orders ED Discharge Orders     None

## 2021-11-14 NOTE — Assessment & Plan Note (Signed)
Question of Hypocalcemia and Hypokalemia. However, BMP looks like it might be a mix of normal saline + blood (other abnormalities on todays BMP suggestive of this include hyperchloremic NAG acidosis, and her renal function looks far better than it is at baseline)! Repeat BMP stat to verify if hypocalcemia and hypokalemia is real.  Asked for new stick.

## 2021-11-14 NOTE — ED Notes (Signed)
Small bloody bowel movement. No vaginal bleeding noted. Md aware.

## 2021-11-14 NOTE — ED Notes (Signed)
Pt has made several statements regarding son refusing to allow her to take medication, and eat certain things. Pt admits to losing significant weight recently.

## 2021-11-14 NOTE — Assessment & Plan Note (Signed)
HGB of 9.2 today down from 11.8 baseline. However, I am not clear if this is accurate as I suspect her BMP at least is diluted with normal saline. Repeat CBC stat and again in AM.

## 2021-11-15 ENCOUNTER — Encounter (HOSPITAL_COMMUNITY): Payer: Self-pay | Admitting: Internal Medicine

## 2021-11-15 DIAGNOSIS — K5731 Diverticulosis of large intestine without perforation or abscess with bleeding: Secondary | ICD-10-CM | POA: Diagnosis not present

## 2021-11-15 LAB — HEMOGLOBIN AND HEMATOCRIT, BLOOD
HCT: 23.4 % — ABNORMAL LOW (ref 36.0–46.0)
HCT: 24.4 % — ABNORMAL LOW (ref 36.0–46.0)
HCT: 25.2 % — ABNORMAL LOW (ref 36.0–46.0)
Hemoglobin: 7.8 g/dL — ABNORMAL LOW (ref 12.0–15.0)
Hemoglobin: 7.8 g/dL — ABNORMAL LOW (ref 12.0–15.0)
Hemoglobin: 7.9 g/dL — ABNORMAL LOW (ref 12.0–15.0)

## 2021-11-15 LAB — BASIC METABOLIC PANEL
Anion gap: 3 — ABNORMAL LOW (ref 5–15)
Anion gap: 3 — ABNORMAL LOW (ref 5–15)
BUN: 24 mg/dL — ABNORMAL HIGH (ref 8–23)
BUN: 25 mg/dL — ABNORMAL HIGH (ref 8–23)
CO2: 25 mmol/L (ref 22–32)
CO2: 25 mmol/L (ref 22–32)
Calcium: 7.5 mg/dL — ABNORMAL LOW (ref 8.9–10.3)
Calcium: 7.7 mg/dL — ABNORMAL LOW (ref 8.9–10.3)
Chloride: 109 mmol/L (ref 98–111)
Chloride: 111 mmol/L (ref 98–111)
Creatinine, Ser: 0.65 mg/dL (ref 0.44–1.00)
Creatinine, Ser: 0.69 mg/dL (ref 0.44–1.00)
GFR, Estimated: >60 mL/min (ref >60)
GFR, Estimated: >60 mL/min (ref >60)
Glucose, Bld: 107 mg/dL — ABNORMAL HIGH (ref 70–99)
Glucose, Bld: 89 mg/dL (ref 70–99)
Potassium: 3.8 mmol/L (ref 3.5–5.1)
Potassium: 4.1 mmol/L (ref 3.5–5.1)
Sodium: 137 mmol/L (ref 135–145)
Sodium: 139 mmol/L (ref 135–145)

## 2021-11-15 LAB — CBC
HCT: 23.5 % — ABNORMAL LOW (ref 36.0–46.0)
HCT: 27.2 % — ABNORMAL LOW (ref 36.0–46.0)
Hemoglobin: 7.6 g/dL — ABNORMAL LOW (ref 12.0–15.0)
Hemoglobin: 8.9 g/dL — ABNORMAL LOW (ref 12.0–15.0)
MCH: 32.8 pg (ref 26.0–34.0)
MCH: 33.5 pg (ref 26.0–34.0)
MCHC: 32.3 g/dL (ref 30.0–36.0)
MCHC: 32.7 g/dL (ref 30.0–36.0)
MCV: 101.3 fL — ABNORMAL HIGH (ref 80.0–100.0)
MCV: 102.3 fL — ABNORMAL HIGH (ref 80.0–100.0)
Platelets: 209 10*3/uL (ref 150–400)
Platelets: 268 10*3/uL (ref 150–400)
RBC: 2.32 MIL/uL — ABNORMAL LOW (ref 3.87–5.11)
RBC: 2.66 MIL/uL — ABNORMAL LOW (ref 3.87–5.11)
RDW: 14.1 % (ref 11.5–15.5)
RDW: 14.2 % (ref 11.5–15.5)
WBC: 4.8 10*3/uL (ref 4.0–10.5)
WBC: 5.8 10*3/uL (ref 4.0–10.5)
nRBC: 0 % (ref 0.0–0.2)
nRBC: 0 % (ref 0.0–0.2)

## 2021-11-15 LAB — TSH: TSH: 19.296 u[IU]/mL — ABNORMAL HIGH (ref 0.350–4.500)

## 2021-11-15 MED ORDER — LIP MEDEX EX OINT
TOPICAL_OINTMENT | CUTANEOUS | Status: DC | PRN
Start: 2021-11-15 — End: 2021-11-18
  Filled 2021-11-15: qty 7

## 2021-11-15 NOTE — Progress Notes (Signed)
Patient ID: [name], female   DOB: February 07, 1930, 88 y.o.   MRN: [id number] IR was consulted for lower GI bleed and positive CTA exam.  CTA demonstrated active bleed from a right colonic diverticulum.  Evaluated patient in the ED and patient was clinically stable but confused about the situation.  Discussed a visceral arteriogram and possible embolization with patient's son and family over the phone.  Family does not want to proceed with an arteriogram but would rather try conservative management at this time.  Patient is getting admitted.  IR will be available if needed.

## 2021-11-15 NOTE — Progress Notes (Signed)
Patient with large amount of dark blood clots, up to [facility], says she is dizzy and feels like she is going to pass out.  Assisted back to bed.

## 2021-11-15 NOTE — Progress Notes (Signed)
PROGRESS NOTE    [name]  BZM:[id number] DOB: 05-14-1929 DOA: 11/14/2021 PCP: Cassandria Anger, MD   Brief Narrative:  My Rinke is a 88 y.o. female with medical history significant of HTN, PPM, hypothyroidism, prior PE.   Pt presented to ED with BRBPR.  Not on blood thinners other than ASA.  No CP, no abd pain, no N/V.  Admitted under hospitalist service with rectal bleeding and acute blood loss anemia.  Details below.  Assessment & Plan:   Principal Problem:   Diverticulosis of colon with hemorrhage Active Problems:   ABLA (acute blood loss anemia)   Hypothyroidism   Hypocalcemia  Rectal bleeding from diverticulosis of colon/acute blood loss anemia: Bleeding diverticula demonstrated in ascending colon on CTA of abdomen and pelvis upon admission.  IR was consulted and reportedly, whole team was ready to perform the embolization but family declined the procedure and wanted conservative management.  Per reports, patient had 1 large bright red blood per rectum this morning.  Her hemoglobin has dropped from 9.2 upon admission to 7.8 this morning.  Per H&P and admitting hospitalist, family does not want the patient to get the blood transfusion and if she does, they wanted [place] to give them a call.  I did speak to patient's son Bland Span at the bedside.  I did explain to him that with the patient having recurrent large episodes of rectal bleeding, she is at risk of dropping her hemoglobin and if she drops less than 7, there would be indication to transfuse blood.  Initially the son was extremely resistant for blood transfusion but after a lot of explanation, he was okay with that but still wants me to give him a call if she needs the blood.  He also is insisting that I allow the patient to eat or drink.  I did explain to him that it is very important that we keep her n.p.o. and ready for any sort of emergent procedure if she requires and despite of me spending another 10 minutes on this topic, he  was adamant that I will let her eat.  I did explain to him that it is not recommended but I have to entertain his wishes.  Per his request, I will place soft diet.  Patient herself is very alert and oriented and very adamant about not having any procedure as of now and in fact she was hoping that she will be discharged today.  I did explain to her that she is not medically stable for discharge and that we will be observing her overnight.  The son appears to be not having any medical background or any medical knowledge but he has more faith in God than in medical science.  In his own words, he says " doc, I understand what you are saying and I respect you for being honest with me but let me tell you, I believe in God more than anybody else does and I know God more than anybody else does.  I have met the person who has made heavens on the earth.  I have never been to the doctors, prayers have kept me very healthy  And I believe that my mom will get better on her own with prayers, I think it is only hemorrhoids which is causing the bleeding and hemorrhoids do not need any treatment and I think if you allow my mother to eat, she will build up her blood count by herself" I spent approximately 30 minutes counseling the son.  We will monitor H&H every 6 hours.  Hypocalcemia: Improved.  Monitor.  If needed, replace.  Hypothyroidism: TSH significantly elevated 19.2.  She is not on any medication for this.  Son and patient not willing to take any medications at this point in time either.  DVT prophylaxis: SCDs Start: 11/14/21 2319   Code Status: Full Code  Family Communication: Son present at bedside.  Plan of care discussed with patient in length and he/she verbalized understanding and agreed with it.  Status is: Inpatient Remains inpatient appropriate because: Actively bleeding.   Estimated body mass index is 18.88 kg/m as calculated from the following:   Height as of this encounter: $RemoveBeforeD'5\' 4"'pCnFWleQuatcMA$  (1.626 m).    Weight as of this encounter: 49.9 kg.  Pressure Injury 11/27/20 Sacrum Stage 2 -  Partial thickness loss of dermis presenting as a shallow open injury with a red, pink wound bed without slough. (Active)  11/27/20 1250  Location: Sacrum  Location Orientation:   Staging: Stage 2 -  Partial thickness loss of dermis presenting as a shallow open injury with a red, pink wound bed without slough.  Wound Description (Comments):   Present on Admission: -- (unknown)   Nutritional Assessment: Body mass index is 18.88 kg/m.[name] Seen by dietician.  I agree with the assessment and plan as outlined below: Nutrition Status:        . Skin Assessment: I have examined the patient's skin and I agree with the wound assessment as performed by the wound care RN as outlined below: Pressure Injury 11/27/20 Sacrum Stage 2 -  Partial thickness loss of dermis presenting as a shallow open injury with a red, pink wound bed without slough. (Active)  11/27/20 1250  Location: Sacrum  Location Orientation:   Staging: Stage 2 -  Partial thickness loss of dermis presenting as a shallow open injury with a red, pink wound bed without slough.  Wound Description (Comments):   Present on Admission: -- (unknown)    Consultants:  IR  Procedures:  None  Antimicrobials:  Anti-infectives (From admission, onward)    None         Subjective: Seen and examined in the ED.  Son at the bedside.  Patient fully alert and oriented and wants to go home.  No complaints.  Objective: Vitals:   11/15/21 0500 11/15/21 0600 11/15/21 0717 11/15/21 0900  BP: 137/71 138/79  140/71  Pulse: 66 73  62  Resp:  16  19  Temp:   98.9 F (37.2 C)   TempSrc:   Oral   SpO2: 100% 99%  100%  Weight:      Height:        Intake/Output Summary (Last 24 hours) at 11/15/2021 1025 Last data filed at 11/14/2021 2216 Gross per 24 hour  Intake 1000 ml  Output --  Net 1000 ml   Filed Weights   11/14/21 1824  Weight: 49.9 kg     Examination:  General exam: Appears calm and comfortable  Respiratory system: Clear to auscultation. Respiratory effort normal. Cardiovascular system: S1 & S2 heard, RRR. No JVD, murmurs, rubs, gallops or clicks. No pedal edema. Gastrointestinal system: Abdomen is nondistended, soft and nontender. No organomegaly or masses felt. Normal bowel sounds heard. Central nervous system: Alert and oriented. No focal neurological deficits. Extremities: Symmetric 5 x 5 power. Skin: No rashes, lesions or ulcers Psychiatry: Judgement and insight appear poor.   Data Reviewed: I have personally reviewed following labs and imaging studies  CBC: Recent Labs  Lab 11/14/21 1917 11/14/21 2348 11/15/21 0423 11/15/21 0737  WBC 5.2 5.8 4.8  --   NEUTROABS 3.4  --   --   --   HGB 9.2* 8.9* 7.6* 7.8*  HCT 28.9* 27.2* 23.5* 23.4*  MCV 104.3* 102.3* 101.3*  --   PLT 244 268 209  --    Basic Metabolic Panel: Recent Labs  Lab 11/14/21 1917 11/14/21 2348 11/15/21 0423  NA 141 137 139  K 2.9* 4.1 3.8  CL 117* 109 111  CO2 19* 25 25  GLUCOSE 84 107* 89  BUN 20 25* 24*  CREATININE 0.47 0.69 0.65  CALCIUM 5.9* 7.7* 7.5*   GFR: Estimated Creatinine Clearance: 37.6 mL/min (by C-G formula based on SCr of 0.65 mg/dL). Liver Function Tests: Recent Labs  Lab 11/14/21 1917  AST 13*  ALT 9  ALKPHOS 37*  BILITOT 0.6  PROT 4.0*  ALBUMIN 2.0*   No results for input(s): "LIPASE", "AMYLASE" in the last 168 hours. No results for input(s): "AMMONIA" in the last 168 hours. Coagulation Profile: Recent Labs  Lab 11/14/21 2023  INR 1.2   Cardiac Enzymes: No results for input(s): "CKTOTAL", "CKMB", "CKMBINDEX", "TROPONINI" in the last 168 hours. BNP (last 3 results) No results for input(s): "PROBNP" in the last 8760 hours. HbA1C: No results for input(s): "HGBA1C" in the last 72 hours. CBG: No results for input(s): "GLUCAP" in the last 168 hours. Lipid Profile: No results for input(s):  "CHOL", "HDL", "LDLCALC", "TRIG", "CHOLHDL", "LDLDIRECT" in the last 72 hours. Thyroid Function Tests: Recent Labs    11/15/21 0026  TSH 19.296*   Anemia Panel: No results for input(s): "VITAMINB12", "FOLATE", "FERRITIN", "TIBC", "IRON", "RETICCTPCT" in the last 72 hours. Sepsis Labs: Recent Labs  Lab 11/14/21 1822  LATICACIDVEN 0.9    No results found for this or any previous visit (from the past 240 hour(s)).   Radiology Studies: CT Head Wo Contrast  Result Date: 11/14/2021 CLINICAL DATA:  Mental status change of unknown cause. Syncopal event. EXAM: CT HEAD WITHOUT CONTRAST TECHNIQUE: Contiguous axial images were obtained from the base of the skull through the vertex without intravenous contrast. RADIATION DOSE REDUCTION: This exam was performed according to the departmental dose-optimization program which includes automated exposure control, adjustment of the mA and/or kV according to patient size and/or use of iterative reconstruction technique. COMPARISON:  12/01/2020 FINDINGS: Brain: Diffuse cerebral atrophy. Ventricular dilatation consistent with central atrophy. Low-attenuation changes in the deep white matter consistent with small vessel ischemia. No abnormal extra-axial fluid collections. No mass effect or midline shift. Gray-white matter junctions are distinct. Basal cisterns are not effaced. No acute intracranial hemorrhage. Vascular: No hyperdense vessel or unexpected calcification. Skull: Normal. Negative for fracture or focal lesion. Sinuses/Orbits: No acute finding. Other: None. IMPRESSION: No acute intracranial abnormalities. Chronic atrophy and small vessel ischemic changes. Electronically Signed   By: Lucienne Capers M.D.   On: 11/14/2021 22:15   CT ANGIO GI BLEED  Result Date: 11/14/2021 CLINICAL DATA:  Acute mesenteric ischemia. Rectal and vaginal bleeding for 1 day. Syncopal episode. EXAM: CTA ABDOMEN AND PELVIS WITHOUT AND WITH CONTRAST TECHNIQUE: Multidetector CT  imaging of the abdomen and pelvis was performed using the standard protocol during bolus administration of intravenous contrast. Multiplanar reconstructed images and MIPs were obtained and reviewed to evaluate the vascular anatomy. RADIATION DOSE REDUCTION: This exam was performed according to the departmental dose-optimization program which includes automated exposure control, adjustment of the mA and/or kV according to patient size and/or use of  iterative reconstruction technique. CONTRAST:  197mL OMNIPAQUE IOHEXOL 350 MG/ML SOLN COMPARISON:  CT abdomen and pelvis 11/06/2020 FINDINGS: VASCULAR Aorta: Unenhanced images demonstrate scattered aortic calcification. Contrast-enhanced images demonstrate patent abdominal aorta without aneurysm or dissection. Celiac: Patent without evidence of aneurysm, dissection, vasculitis or significant stenosis. SMA: The superior mesenteric artery arises from the celiac trunk. Patent without evidence of aneurysm, dissection, vasculitis or significant stenosis. Renals: Both renal arteries are patent without evidence of aneurysm, dissection, vasculitis, fibromuscular dysplasia or significant stenosis. IMA: Patent without evidence of aneurysm, dissection, vasculitis or significant stenosis. Inflow: Patent without evidence of aneurysm, dissection, vasculitis or significant stenosis. Proximal Outflow: Bilateral common femoral and visualized portions of the superficial and profunda femoral arteries are patent without evidence of aneurysm, dissection, vasculitis or significant stenosis. Veins: No obvious venous abnormality within the limitations of this arterial phase study. Review of the MIP images confirms the above findings. NON-VASCULAR Lower chest: Mild atelectasis in the lung bases. Mild cardiac enlargement. Hepatobiliary: No focal liver lesions. Cholelithiasis. No evidence of gallbladder wall thickening or inflammation. No bile duct dilatation. Pancreas: Unremarkable. No pancreatic  ductal dilatation or surrounding inflammatory changes. Spleen: Normal in size without focal abnormality. Adrenals/Urinary Tract: No adrenal gland nodule. Benign simple appearing cyst on the left kidney measuring 2.6 cm diameter. No additional imaging follow-up is indicated for this typically benign-appearing lesion. No change since prior study. No hydronephrosis. Renal nephrograms are symmetrical. Bladder is normal. Stomach/Bowel: Stomach, small bowel, and colon are not abnormally distended. Stool seen diffusely throughout the colon. Focal contrast pooling is suggested in a diverticulum of the ascending colon, not present on the unenhanced image and mildly diffuse on the delayed image. This likely represents acute hemorrhage within a diverticulum. No associated mass or wall thickening is seen. Lymphatic: No significant lymphadenopathy. Reproductive: Status post hysterectomy. No adnexal masses. Other: No abdominal wall hernia or abnormality. No abdominopelvic ascites. Musculoskeletal: No acute or significant osseous findings. IMPRESSION: VASCULAR Aortic atherosclerosis. No aneurysm or dissection. No large vessel occlusion. NON-VASCULAR Focal active intraluminal hemorrhage demonstrated in the ascending colon consistent with a hemorrhagic diverticulum. Cholelithiasis without evidence of acute cholecystitis. Electronically Signed   By: Lucienne Capers M.D.   On: 11/14/2021 22:13    Scheduled Meds:  fentaNYL       lidocaine       midazolam       Continuous Infusions:  sodium chloride 125 mL/hr at 11/15/21 9528     LOS: 1 day   Darliss Cheney, MD [facility]  11/15/2021, 10:25 AM   *Please note that this is a verbal dictation therefore any spelling or grammatical errors are due to the "[employer] [employer]" system interpretation.  Please page via [place] and do not message via secure chat for urgent patient care matters. Secure chat can be used for non urgent patient care matters.  How to contact  the [facility] Attending or Consulting provider [place] or covering provider during after hours [place], for this patient?  Check the care team in [facility] and look for a) attending/consulting [facility] provider listed and b) the [facility] team listed. Page or secure chat [phone]. Log into [email or web address] and use [place]'s universal password to access. If you do not have the password, please contact the hospital operator. Locate the [facility] provider you are looking for under [facility] and page to a number that you can be directly reached. If you still have difficulty reaching the provider, please page the [facility] (Director on Call) for the Hospitalists  listed on amion for assistance.

## 2021-11-15 NOTE — Assessment & Plan Note (Signed)
Looks like shes not on any home meds including thyroid replacement at home at this point. 1. Will put in for TSH.

## 2021-11-16 DIAGNOSIS — K5731 Diverticulosis of large intestine without perforation or abscess with bleeding: Secondary | ICD-10-CM | POA: Diagnosis not present

## 2021-11-16 LAB — CBC
HCT: 23.6 % — ABNORMAL LOW (ref 36.0–46.0)
HCT: 24.2 % — ABNORMAL LOW (ref 36.0–46.0)
Hemoglobin: 7.5 g/dL — ABNORMAL LOW (ref 12.0–15.0)
Hemoglobin: 7.8 g/dL — ABNORMAL LOW (ref 12.0–15.0)
MCH: 33.3 pg (ref 26.0–34.0)
MCH: 33.3 pg (ref 26.0–34.0)
MCHC: 31.8 g/dL (ref 30.0–36.0)
MCHC: 32.2 g/dL (ref 30.0–36.0)
MCV: 104.9 fL — ABNORMAL HIGH (ref 80.0–100.0)
MCV: 103.4 fL — ABNORMAL HIGH (ref 80.0–100.0)
Platelets: 210 10*3/uL (ref 150–400)
Platelets: 236 10*3/uL (ref 150–400)
RBC: 2.25 MIL/uL — ABNORMAL LOW (ref 3.87–5.11)
RBC: 2.34 MIL/uL — ABNORMAL LOW (ref 3.87–5.11)
RDW: 14.4 % (ref 11.5–15.5)
RDW: 14.3 % (ref 11.5–15.5)
WBC: 3.5 10*3/uL — ABNORMAL LOW (ref 4.0–10.5)
WBC: 3.9 10*3/uL — ABNORMAL LOW (ref 4.0–10.5)
nRBC: 0 % (ref 0.0–0.2)
nRBC: 0 % (ref 0.0–0.2)

## 2021-11-16 LAB — BASIC METABOLIC PANEL
Anion gap: 4 — ABNORMAL LOW (ref 5–15)
BUN: 17 mg/dL (ref 8–23)
CO2: 21 mmol/L — ABNORMAL LOW (ref 22–32)
Calcium: 8 mg/dL — ABNORMAL LOW (ref 8.9–10.3)
Chloride: 117 mmol/L — ABNORMAL HIGH (ref 98–111)
Creatinine, Ser: 0.71 mg/dL (ref 0.44–1.00)
GFR, Estimated: >60 mL/min (ref >60)
Glucose, Bld: 78 mg/dL (ref 70–99)
Potassium: 3.3 mmol/L — ABNORMAL LOW (ref 3.5–5.1)
Sodium: 142 mmol/L (ref 135–145)

## 2021-11-16 MED ORDER — POTASSIUM CHLORIDE CRYS ER 20 MEQ PO TBCR
40.0000 meq | EXTENDED_RELEASE_TABLET | Freq: Once | ORAL | Status: AC
Start: 2021-11-16 — End: 2021-11-16
  Administered 2021-11-16: 40 meq via ORAL
  Filled 2021-11-16: qty 2

## 2021-11-16 MED ORDER — ORAL CARE MOUTH RINSE: 15.0000 mL | OROMUCOSAL | Status: DC | PRN

## 2021-11-16 NOTE — Plan of Care (Signed)
  Problem: Clinical Measurements: Goal: Will remain free from infection Outcome: Progressing   Problem: Activity: Goal: Risk for activity intolerance will decrease Outcome: Progressing   Problem: Nutrition: Goal: Adequate nutrition will be maintained Outcome: Progressing   Problem: Pain Managment: Goal: General experience of comfort will improve Outcome: Progressing   Problem: Safety: Goal: Ability to remain free from injury will improve Outcome: Progressing   Problem: Skin Integrity: Goal: Risk for impaired skin integrity will decrease Outcome: Progressing   Problem: Fluid Volume: Goal: Will show no signs and symptoms of excessive bleeding Outcome: Progressing

## 2021-11-16 NOTE — Progress Notes (Signed)
PROGRESS NOTE    [name]  BMW:[id number] DOB: 02-15-1930 DOA: 11/14/2021 PCP: Cassandria Anger, MD   Brief Narrative:  [name] is a 88 y.o. female with medical history significant of HTN, PPM, hypothyroidism, prior PE.   Pt presented to ED with BRBPR.  Not on blood thinners other than ASA.  No CP, no abd pain, no N/V.  Admitted under hospitalist service with rectal bleeding and acute blood loss anemia.  Details below.  Assessment & Plan:   Principal Problem:   Diverticulosis of colon with hemorrhage Active Problems:   ABLA (acute blood loss anemia)   Hypothyroidism   Hypocalcemia  Rectal bleeding from diverticulosis of colon/acute blood loss anemia: Bleeding diverticula demonstrated in ascending colon on CTA of abdomen and pelvis upon admission.  IR was consulted and reportedly, whole team was ready to perform the embolization but family declined the procedure and wanted conservative management.  Long discussion with the son and patient which is documented in the progress note of 11/15/2021.  Per patient, she has had only 1 small bloody bowel movement in last 24 hours.  Her hemoglobin has remained stable between seven-point 5-8.  No indication for transfusion.  Patient adamant on going home but understands that she needs to stay in the hospital for observation as diverticular bleeds are notoriously known for the bleeding.  She understands that she will be discharged potentially tomorrow if she does not have any further rectal bleeding and she does not require blood transfusion.  Monitor H&H every 12 hours.  Hypocalcemia: Improved.  Monitor.  If needed, replace.  Hypothyroidism: TSH significantly elevated 19.2.  She is not on any medication for this.  Son and patient not willing to take any medications at this point in time either.  Hypokalemia: 3.3.  Will replace.  DVT prophylaxis: SCDs Start: 11/14/21 2319   Code Status: Full Code  Family Communication: None present at  bedside.  Plan of care discussed with patient in length and he/she verbalized understanding and agreed with it.  Status is: Inpatient Remains inpatient appropriate because: Actively bleeding.   Estimated body mass index is 18.88 kg/m as calculated from the following:   Height as of this encounter: '5\' 4"'$  (1.626 m).   Weight as of this encounter: 49.9 kg.  Pressure Injury 11/27/20 Sacrum Stage 2 -  Partial thickness loss of dermis presenting as a shallow open injury with a red, pink wound bed without slough. (Active)  11/27/20 1250  Location: Sacrum  Location Orientation:   Staging: Stage 2 -  Partial thickness loss of dermis presenting as a shallow open injury with a red, pink wound bed without slough.  Wound Description (Comments):   Present on Admission: -- (unknown)   Nutritional Assessment: Body mass index is 18.88 kg/m.[name] Seen by dietician.  I agree with the assessment and plan as outlined below: Nutrition Status:        . Skin Assessment: I have examined the patient's skin and I agree with the wound assessment as performed by the wound care RN as outlined below:  Consultants:  IR  Procedures:  None  Antimicrobials:  Anti-infectives (From admission, onward)    None         Subjective:  Seen and examined for remains alert and oriented as yesterday.  Tells me she has had only 1 small bowel movement with the blood yesterday.  No abdominal pain or nausea currently.  Tolerating diet.  Wants to go home.  Objective: Vitals:   11/15/21  2119 11/15/21 2350 11/16/21 0115 11/16/21 0602  BP: 131/76  137/75 (!) 152/77  Pulse: 60  62 60  Resp: '18  18 20  '$ Temp: 100.1 F (37.8 C) 99 F (37.2 C) 99.3 F (37.4 C) 98.9 F (37.2 C)  TempSrc: Oral Oral Oral Oral  SpO2: 100%  100% 100%  Weight:      Height:        Intake/Output Summary (Last 24 hours) at 11/16/2021 1041 Last data filed at 11/16/2021 0500 Gross per 24 hour  Intake 3580.61 ml  Output 2500 ml  Net  1080.61 ml    Filed Weights   11/14/21 1824  Weight: 49.9 kg    Examination:  General exam: Appears calm and comfortable  Respiratory system: Clear to auscultation. Respiratory effort normal. Cardiovascular system: S1 & S2 heard, RRR. No JVD, murmurs, rubs, gallops or clicks. No pedal edema. Gastrointestinal system: Abdomen is nondistended, soft and nontender. No organomegaly or masses felt. Normal bowel sounds heard. Central nervous system: Alert and oriented. No focal neurological deficits. Extremities: Symmetric 5 x 5 power. Skin: No rashes, lesions or ulcers.  Psychiatry: Judgement and insight appear normal. Mood & affect appropriate.   Data Reviewed: I have personally reviewed following labs and imaging studies  CBC: Recent Labs  Lab 11/14/21 1917 11/14/21 2348 11/15/21 0423 11/15/21 0737 11/15/21 1505 11/15/21 1937 11/16/21 0400  WBC 5.2 5.8 4.8  --   --   --  3.5*  NEUTROABS 3.4  --   --   --   --   --   --   HGB 9.2* 8.9* 7.6* 7.8* 7.8* 7.9* 7.5*  HCT 28.9* 27.2* 23.5* 23.4* 24.4* 25.2* 23.6*  MCV 104.3* 102.3* 101.3*  --   --   --  104.9*  PLT 244 268 209  --   --   --  454    Basic Metabolic Panel: Recent Labs  Lab 11/14/21 1917 11/14/21 2348 11/15/21 0423 11/16/21 0400  NA 141 137 139 142  K 2.9* 4.1 3.8 3.3*  CL 117* 109 111 117*  CO2 19* 25 25 21*  GLUCOSE 84 107* 89 78  BUN 20 25* 24* 17  CREATININE 0.47 0.69 0.65 0.71  CALCIUM 5.9* 7.7* 7.5* 8.0*    GFR: Estimated Creatinine Clearance: 37.6 mL/min (by C-G formula based on SCr of 0.71 mg/dL). Liver Function Tests: Recent Labs  Lab 11/14/21 1917  AST 13*  ALT 9  ALKPHOS 37*  BILITOT 0.6  PROT 4.0*  ALBUMIN 2.0*    No results for input(s): "LIPASE", "AMYLASE" in the last 168 hours. No results for input(s): "AMMONIA" in the last 168 hours. Coagulation Profile: Recent Labs  Lab 11/14/21 2023  INR 1.2    Cardiac Enzymes: No results for input(s): "CKTOTAL", "CKMB", "CKMBINDEX",  "TROPONINI" in the last 168 hours. BNP (last 3 results) No results for input(s): "PROBNP" in the last 8760 hours. HbA1C: No results for input(s): "HGBA1C" in the last 72 hours. CBG: No results for input(s): "GLUCAP" in the last 168 hours. Lipid Profile: No results for input(s): "CHOL", "HDL", "LDLCALC", "TRIG", "CHOLHDL", "LDLDIRECT" in the last 72 hours. Thyroid Function Tests: Recent Labs    11/15/21 0026  TSH 19.296*    Anemia Panel: No results for input(s): "VITAMINB12", "FOLATE", "FERRITIN", "TIBC", "IRON", "RETICCTPCT" in the last 72 hours. Sepsis Labs: Recent Labs  Lab 11/14/21 1822  LATICACIDVEN 0.9     No results found for this or any previous visit (from the past 240 hour(s)).  Radiology Studies: CT Head Wo Contrast  Result Date: 11/14/2021 CLINICAL DATA:  Mental status change of unknown cause. Syncopal event. EXAM: CT HEAD WITHOUT CONTRAST TECHNIQUE: Contiguous axial images were obtained from the base of the skull through the vertex without intravenous contrast. RADIATION DOSE REDUCTION: This exam was performed according to the departmental dose-optimization program which includes automated exposure control, adjustment of the mA and/or kV according to patient size and/or use of iterative reconstruction technique. COMPARISON:  12/01/2020 FINDINGS: Brain: Diffuse cerebral atrophy. Ventricular dilatation consistent with central atrophy. Low-attenuation changes in the deep white matter consistent with small vessel ischemia. No abnormal extra-axial fluid collections. No mass effect or midline shift. Gray-white matter junctions are distinct. Basal cisterns are not effaced. No acute intracranial hemorrhage. Vascular: No hyperdense vessel or unexpected calcification. Skull: Normal. Negative for fracture or focal lesion. Sinuses/Orbits: No acute finding. Other: None. IMPRESSION: No acute intracranial abnormalities. Chronic atrophy and small vessel ischemic changes. Electronically  Signed   By: Lucienne Capers M.D.   On: 11/14/2021 22:15   CT ANGIO GI BLEED  Result Date: 11/14/2021 CLINICAL DATA:  Acute mesenteric ischemia. Rectal and vaginal bleeding for 1 day. Syncopal episode. EXAM: CTA ABDOMEN AND PELVIS WITHOUT AND WITH CONTRAST TECHNIQUE: Multidetector CT imaging of the abdomen and pelvis was performed using the standard protocol during bolus administration of intravenous contrast. Multiplanar reconstructed images and MIPs were obtained and reviewed to evaluate the vascular anatomy. RADIATION DOSE REDUCTION: This exam was performed according to the departmental dose-optimization program which includes automated exposure control, adjustment of the mA and/or kV according to patient size and/or use of iterative reconstruction technique. CONTRAST:  173m OMNIPAQUE IOHEXOL 350 MG/ML SOLN COMPARISON:  CT abdomen and pelvis 11/06/2020 FINDINGS: VASCULAR Aorta: Unenhanced images demonstrate scattered aortic calcification. Contrast-enhanced images demonstrate patent abdominal aorta without aneurysm or dissection. Celiac: Patent without evidence of aneurysm, dissection, vasculitis or significant stenosis. SMA: The superior mesenteric artery arises from the celiac trunk. Patent without evidence of aneurysm, dissection, vasculitis or significant stenosis. Renals: Both renal arteries are patent without evidence of aneurysm, dissection, vasculitis, fibromuscular dysplasia or significant stenosis. IMA: Patent without evidence of aneurysm, dissection, vasculitis or significant stenosis. Inflow: Patent without evidence of aneurysm, dissection, vasculitis or significant stenosis. Proximal Outflow: Bilateral common femoral and visualized portions of the superficial and profunda femoral arteries are patent without evidence of aneurysm, dissection, vasculitis or significant stenosis. Veins: No obvious venous abnormality within the limitations of this arterial phase study. Review of the MIP images  confirms the above findings. NON-VASCULAR Lower chest: Mild atelectasis in the lung bases. Mild cardiac enlargement. Hepatobiliary: No focal liver lesions. Cholelithiasis. No evidence of gallbladder wall thickening or inflammation. No bile duct dilatation. Pancreas: Unremarkable. No pancreatic ductal dilatation or surrounding inflammatory changes. Spleen: Normal in size without focal abnormality. Adrenals/Urinary Tract: No adrenal gland nodule. Benign simple appearing cyst on the left kidney measuring 2.6 cm diameter. No additional imaging follow-up is indicated for this typically benign-appearing lesion. No change since prior study. No hydronephrosis. Renal nephrograms are symmetrical. Bladder is normal. Stomach/Bowel: Stomach, small bowel, and colon are not abnormally distended. Stool seen diffusely throughout the colon. Focal contrast pooling is suggested in a diverticulum of the ascending colon, not present on the unenhanced image and mildly diffuse on the delayed image. This likely represents acute hemorrhage within a diverticulum. No associated mass or wall thickening is seen. Lymphatic: No significant lymphadenopathy. Reproductive: Status post hysterectomy. No adnexal masses. Other: No abdominal wall hernia or abnormality. No abdominopelvic  ascites. Musculoskeletal: No acute or significant osseous findings. IMPRESSION: VASCULAR Aortic atherosclerosis. No aneurysm or dissection. No large vessel occlusion. NON-VASCULAR Focal active intraluminal hemorrhage demonstrated in the ascending colon consistent with a hemorrhagic diverticulum. Cholelithiasis without evidence of acute cholecystitis. Electronically Signed   By: Lucienne Capers M.D.   On: 11/14/2021 22:13    Scheduled Meds:   Continuous Infusions:  sodium chloride 125 mL/hr at 11/16/21 0606     LOS: 2 days   Darliss Cheney, MD [facility]  11/16/2021, 10:41 AM   *Please note that this is a verbal dictation therefore any spelling or  grammatical errors are due to the "[place]" system interpretation.  Please page via [place] and do not message via secure chat for urgent patient care matters. Secure chat can be used for non urgent patient care matters.  How to contact the [facility] Attending or Consulting provider [place] or covering provider during after hours [place], for this patient?  Check the care team in [facility] and look for a) attending/consulting TRH provider listed and b) the [facility] team listed. Page or secure chat [phone]. Log into [email or web address] and use [place]'s universal password to access. If you do not have the password, please contact the hospital operator. Locate the [facility] provider you are looking for under [facility] and page to a number that you can be directly reached. If you still have difficulty reaching the provider, please page the [facility] (Director on Call) for the Hospitalists listed on amion for assistance.

## 2021-11-16 NOTE — TOC Initial Note (Signed)
Transition of Care [facility]) - Initial/Assessment Note    Patient Details  Name: [name] MRN: [id number] Date of Birth: Oct 07, 1929  [facility] CM/SW Contact:    Leeroy Cha, RN Phone Number: 11/16/2021, 10:56 AM  Clinical Narrative:                 Spoke with patient at length about her home situation.  Stated that she is safe and that her grandson takes good care of her at home.  Expected Discharge Plan: [place] Barriers to Discharge: Continued Medical Work up   Patient Goals and CMS Choice Patient states their goals for this hospitalization and ongoing recovery are:: to go home CMS Medicare.gov Compare Post Acute Care list provided to:: Patient Choice offered to / list presented to : Patient  Expected Discharge Plan and Services Expected Discharge Plan: [place]   Discharge Planning Services: CM Consult Post Acute Care Choice: [place] arrangements for the past 2 months: Single Family Home                                      Prior Living Arrangements/Services Living arrangements for the past 2 months: Single Family Home Lives with:: Adult Children Patient language and need for interpreter reviewed:: Yes Do you feel safe going back to the place where you live?: Yes      Need for Family Participation in Patient Care: Yes (Comment) Care giver support system in place?: Yes (comment)   Criminal Activity/Legal Involvement Pertinent to Current Situation/Hospitalization: No - Comment as needed  Activities of Daily Living Home Assistive Devices/Equipment: Eyeglasses, Dentures (specify type) ADL Screening (condition at time of admission) Patient's cognitive ability adequate to safely complete daily activities?: Yes Is the patient deaf or have difficulty hearing?: No Does the patient have difficulty seeing, even when wearing glasses/contacts?: No Does the patient have difficulty concentrating,  remembering, or making decisions?: No Patient able to express need for assistance with ADLs?: Yes Does the patient have difficulty dressing or bathing?: No Independently performs ADLs?: Yes (appropriate for developmental age) Does the patient have difficulty walking or climbing stairs?: No Weakness of Legs: None Weakness of Arms/Hands: None  Permission Sought/Granted                  Emotional Assessment Appearance:: Appears stated age Attitude/Demeanor/Rapport: Gracious Affect (typically observed): Calm Orientation: : Oriented to Self, Oriented to Place, Oriented to  Time, Oriented to Situation Alcohol / Substance Use: Not Applicable Psych Involvement: No (comment)  Admission diagnosis:  Diverticulosis of colon with hemorrhage [K57.31] Rectal bleeding [K62.5] Patient Active Problem List   Diagnosis Date Noted   Diverticulosis of colon with hemorrhage 11/14/2021   ABLA (acute blood loss anemia) 11/14/2021   Hypocalcemia 11/14/2021   Gait disorder 05/19/2021   Noncompliance 05/19/2021   Protein-calorie malnutrition, severe 11/28/2020   Pressure injury of skin 11/28/2020   AMS (altered mental status) 11/26/2020   Acute lower UTI 11/26/2020   Moderate recurrent major depression ([place]) 05/09/2020   Generalized anxiety disorder 05/09/2020   Moderately severe recurrent major depression ([place]) 05/09/2020   Weight loss 10/09/2019   DVT (deep venous thrombosis) ([place]) 03/29/2018   PE (pulmonary thromboembolism) ([place]) 02/03/2018   Macrocytosis 12/21/2017   Wart 12/21/2017   Sleep apnea 07/20/2016   Sigmoid diverticulitis 07/01/2016   Acute diverticulitis    Rectal bleeding  06/30/2016   Impacted cerumen of right ear 03/26/2016   CAD (coronary artery disease) 03/22/2016   Exertional dyspnea 11/04/2015   Ascending aortic aneurysm ([place]) 11/04/2015   Discoid lupus 10/10/2015   Pulmonary hypertension ([place]) 09/30/2015   Rotator cuff disorder 03/06/2015   OCD (obsessive compulsive  disorder) 04/03/2013   IBS (irritable bowel syndrome) 07/03/2012   Dysphagia 04/13/2012   Denture irritation 12/20/2011   Irritable bladder 12/20/2011   Osteoarthritis of both knees 12/20/2011   Hearing loss 10/11/2011   Pacemaker-Medtronic 05/18/2011   DOE (dyspnea on exertion) 04/13/2011   Gout, unspecified 04/30/2010   Anal fissure 02/03/2010   Sinoatrial node dysfunction ([place]) 09/17/2008   HYPOTENSION, ORTHOSTATIC 01/29/2008   Microscopic hematuria 08/21/2007   Pain in joint, pelvic region and thigh 08/01/2007   Anxiety disorder 05/23/2007   Hypothyroidism 03/22/2007   GERD 03/22/2007   Osteoarthritis 03/22/2007   CFS (chronic fatigue syndrome) 11/17/2006   Hypertensive heart disease 11/17/2006   ALLERGIC RHINITIS 11/17/2006   Osteoporosis 11/17/2006   BREAST CANCER, HX OF 11/17/2006   PCP:  Cassandria Anger, MD Pharmacy:   [place] [address] [place] [address] Phone: [phone] Fax: [phone]     Social Determinants of Health (SDOH) Interventions    Readmission Risk Interventions     No data to display

## 2021-11-16 NOTE — Evaluation (Signed)
Physical Therapy Evaluation Patient Details Name: [name] MRN: [id number] DOB: 11-Oct-1929 Today's Date: 11/16/2021  History of Present Illness  88 yo female admitted with divertivulosis, anemia, general weakness. Hx of CKD, CHF, depression, anxiety  Clinical Impression  On eval, pt was Min guard assist for mobility. She walked ~70 feet with a RW. Pt presents with general weakness, decreased activity tolerance, and impaired gait and balance. Orthostatic BP readings entered in vitals signs section of chart. No family present during session. At this time, PT recommendation is for HHPT f/u and RW use for ambulation safety. Will plan to follow pt during this hospital stay.        Recommendations for follow up therapy are one component of a multi-disciplinary discharge planning process, led by the attending physician.  Recommendations may be updated based on patient status, additional functional criteria and insurance authorization.  Follow Up Recommendations Home health PT      Assistance Recommended at Discharge Intermittent Supervision/Assistance  Patient can return home with the following  A little help with walking and/or transfers;A little help with bathing/dressing/bathroom;Assistance with cooking/housework;Assist for transportation;Help with stairs or ramp for entrance    Equipment Recommendations Rolling walker (2 wheels) (if pt doesn't have one already)  Recommendations for Other Services       Functional Status Assessment Patient has had a recent decline in their functional status and demonstrates the ability to make significant improvements in function in a reasonable and predictable amount of time.     Precautions / Restrictions Precautions Precautions: Fall Restrictions Weight Bearing Restrictions: No      Mobility  Bed Mobility Overal bed mobility: Needs Assistance Bed Mobility: Supine to Sit     Supine to sit: Supervision     General bed mobility comments:  Supv for safety, lines. Pt reports dizziness.    Transfers Overall transfer level: Needs assistance Equipment used: Rolling walker (2 wheels) Transfers: Sit to/from Stand Sit to Stand: Min guard           General transfer comment: Min guard for safety. Cues for safety, hand placement. Pt reports dizziness.    Ambulation/Gait Ambulation/Gait assistance: Min guard Gait Distance (Feet): 70 Feet Assistive device: Rolling walker (2 wheels) Gait Pattern/deviations: Step-through pattern, Decreased stride length       General Gait Details: Min guard for safety. Followed with recliner and used it to transport pt back to room. Pt reports dizzines was not any worse with ambulation.  Stairs            Wheelchair Mobility    Modified Rankin (Stroke Patients Only)       Balance Overall balance assessment: Needs assistance         Standing balance support: Bilateral upper extremity supported, During functional activity, Reliant on assistive device for balance Standing balance-Leahy Scale: Poor                               Pertinent Vitals/Pain Pain Assessment Pain Assessment: No/denies pain    Home Living Family/patient expects to be discharged to:: Private residence Living Arrangements: Children Available Help at Discharge: Family;Available PRN/intermittently Type of Home: House Home Access: Stairs to enter   Entrance Stairs-Number of Steps: 3 Alternate Level Stairs-Number of Steps: 1 flight Home Layout: Bed/bath upstairs;Two level        Prior Function Prior Level of Function : Independent/Modified Independent  Hand Dominance        Extremity/Trunk Assessment   Upper Extremity Assessment Upper Extremity Assessment: Generalized weakness    Lower Extremity Assessment Lower Extremity Assessment: Generalized weakness    Cervical / Trunk Assessment Cervical / Trunk Assessment: Normal  Communication       Cognition Arousal/Alertness: Awake/alert Behavior During Therapy: WFL for tasks assessed/performed Overall Cognitive Status: Within Functional Limits for tasks assessed                                          General Comments      Exercises     Assessment/Plan    PT Assessment Patient needs continued PT services  PT Problem List Decreased strength;Decreased balance;Decreased activity tolerance;Decreased mobility;Decreased knowledge of use of DME       PT Treatment Interventions DME instruction;Gait training;Therapeutic activities;Patient/family education;Balance training;Functional mobility training;Therapeutic exercise    PT Goals (Current goals can be found in the Care Plan section)  Acute Rehab PT Goals Patient Stated Goal: to feel less tired PT Goal Formulation: With patient Time For Goal Achievement: 11/30/21 Potential to Achieve Goals: Good    Frequency Min 3X/week     Co-evaluation               AM-PAC PT "6 Clicks" Mobility  Outcome Measure Help needed turning from your back to your side while in a flat bed without using bedrails?: A Little Help needed moving from lying on your back to sitting on the side of a flat bed without using bedrails?: A Little Help needed moving to and from a bed to a chair (including a wheelchair)?: A Little Help needed standing up from a chair using your arms (e.g., wheelchair or bedside chair)?: A Little Help needed to walk in hospital room?: A Little Help needed climbing 3-5 steps with a railing? : A Little 6 Click Score: 18    End of Session Equipment Utilized During Treatment: Gait belt Activity Tolerance: Patient limited by fatigue;Patient tolerated treatment well Patient left: in chair;with call bell/phone within reach;with chair alarm set   PT Visit Diagnosis: Muscle weakness (generalized) (M62.81);Difficulty in walking, not elsewhere classified (R26.2)    Time: 6861-6837 PT Time Calculation  (min) (ACUTE ONLY): 24 min   Charges:   PT Evaluation $PT Eval Moderate Complexity: 1 Mod PT Treatments $Gait Training: 8-22 mins          Doreatha Massed, PT Acute Rehabilitation  Office: [phone] Pager: [phone]

## 2021-11-16 NOTE — Progress Notes (Signed)
Patient daughter asked a lot of questions about the plan for her mom. RN reviewed her mother's hemoglobin levels. Daughter asked a lot of questions about what is really causing the bleeding. RN reviewed notes about the CT scan that was completed. Daughter also did not have a complete understanding of the disease process of diverticuli. RN tried to educate and answer questions while qualifying that RN is the nurse, not the doctor. Daughter did say her mother has taken a lot of aspirin and Advil.    Daughter expressed unclear understanding about what the recommended procedure really entails and the type of sedation that would be used.   RN communicated all of this information with attending physician.   RN will continue to monitor for bleeding output along with hemoglobin levels.

## 2021-11-17 DIAGNOSIS — Z1330 Encounter for screening examination for mental health and behavioral disorders, unspecified: Secondary | ICD-10-CM

## 2021-11-17 DIAGNOSIS — K5731 Diverticulosis of large intestine without perforation or abscess with bleeding: Secondary | ICD-10-CM | POA: Diagnosis not present

## 2021-11-17 LAB — CBC
HCT: 21.6 % — ABNORMAL LOW (ref 36.0–46.0)
Hemoglobin: 6.8 g/dL — CL (ref 12.0–15.0)
MCH: 32.9 pg (ref 26.0–34.0)
MCHC: 31.5 g/dL (ref 30.0–36.0)
MCV: 104.3 fL — ABNORMAL HIGH (ref 80.0–100.0)
Platelets: 210 10*3/uL (ref 150–400)
RBC: 2.07 MIL/uL — ABNORMAL LOW (ref 3.87–5.11)
RDW: 14.3 % (ref 11.5–15.5)
WBC: 4.4 10*3/uL (ref 4.0–10.5)
nRBC: 0 % (ref 0.0–0.2)

## 2021-11-17 LAB — BASIC METABOLIC PANEL
Anion gap: 3 — ABNORMAL LOW (ref 5–15)
BUN: 17 mg/dL (ref 8–23)
CO2: 22 mmol/L (ref 22–32)
Calcium: 8 mg/dL — ABNORMAL LOW (ref 8.9–10.3)
Chloride: 117 mmol/L — ABNORMAL HIGH (ref 98–111)
Creatinine, Ser: 0.8 mg/dL (ref 0.44–1.00)
GFR, Estimated: >60 mL/min (ref >60)
Glucose, Bld: 110 mg/dL — ABNORMAL HIGH (ref 70–99)
Potassium: 3.8 mmol/L (ref 3.5–5.1)
Sodium: 142 mmol/L (ref 135–145)

## 2021-11-17 LAB — PREPARE RBC (CROSSMATCH)

## 2021-11-17 MED ORDER — SODIUM CHLORIDE 0.9% IV SOLUTION: Freq: Once | INTRAVENOUS | Status: DC

## 2021-11-17 NOTE — Hospital Course (Addendum)
At this time, there is insufficient evidence to warrant removal of the patient's rights for medical decision-making. She can clearly determine mental capacity for decision-making due to presence of underlying acute transient etiology (diverticulosis of the colon with hemorrhage and acute blood loss).  At this time, we can determine that the patient does have functional mental capacity for medical decision-making including the right to accept or refuse treatment.  .    A long discussion was had with patient regarding her medical complaints and her prognosis if she chooses not to engage in embolization or treatment for her blood loss.  Patient expresses an understanding of both her medical condition and the proposed plan.  Patient is able to express a choice to proceed with treatment and speak with PMT. She remains oppositional about therapy at this time but will recommend consult for palliative medicine.  Patient has an appreciation of the fact that she may be harmed if she refuses blood.  Patient is able to reason with writer and explain why she has made her decision the way she has made it.  For these reasons writer feels that patient has capacity at this point in time to participate in treatment

## 2021-11-17 NOTE — Plan of Care (Signed)
  Problem: Clinical Measurements: Goal: Will remain free from infection Outcome: Progressing   Problem: Nutrition: Goal: Adequate nutrition will be maintained Outcome: Progressing   Problem: Safety: Goal: Ability to remain free from injury will improve Outcome: Progressing   Problem: Skin Integrity: Goal: Risk for impaired skin integrity will decrease Outcome: Progressing   Problem: Fluid Volume: Goal: Will show no signs and symptoms of excessive bleeding Outcome: Progressing

## 2021-11-17 NOTE — Progress Notes (Signed)
    OVERNIGHT PROGRESS REPORT   Notified by RN.  Verified blood products refusal on chart.   Gershon Cull MSNA MSN ACNPC-AG Acute Care Nurse Practitioner [place]

## 2021-11-17 NOTE — Progress Notes (Signed)
Mobility Specialist Cancellation/Refusal Note:   Reason for Cancellation/Refusal: Pt declined mobility at this time. MD requested to hold on any mobility due to low hgb. Will check back as schedule permits.    Ferd Hibbs Mobility Specialist

## 2021-11-17 NOTE — Progress Notes (Signed)
OT Cancellation Note  Patient Details Name: [name] MRN: [id number] DOB: August 17, 1929   Cancelled Treatment:    Reason Eval/Treat Not Completed: Medical issues which prohibited therapy: Hbg drop from 7.8 on 7/24 to 6.8.Dr. Doristine Bosworth notified and requested OT to hold.   Julien Girt 11/17/2021, 9:04 AM

## 2021-11-17 NOTE — Consult Note (Signed)
Per chart review, patient was admitted on July 22 with rectal bleeding and acute blood loss anemia.  Patient's hospital course has been complicated by ongoing blood loss, refusal of procedures and treatment, and perceptions of medical stigma/providers.  There is concern that patient will continue to bleed if procedure is not performed.  She has initially refused treatments and blood products, up until today when her son had discussion with MD and decided to proceed with blood transfusion.  Prior to capacity evaluation patient also agreed to embolization.  This patient's capacity evaluation was impacted by health literacy, perception of medicine/physicians, life being fulfilled, and an acute stress reaction. "  My son does not believe in doctors, he also tells me not to take medication.  I was taking aspirin daily, due to danger pain he believes this is what caused me to start bleeding.  I am 88 years old, I am expected to continue to take care of myself as he is unable to do so.  This condition is worsening and I think death is better than the way I feel right now.  Although I am not suicidal, please do not get it confused.  I just do not like being helpless, unable to urinate independently, receiving procedures that I may not need."   On interview, [name] reports that she is receiving treatment for bleeding."  I thought I was bleeding from her vagina, but they tell me I am bleeding from our bottom. "She initially reports that she does understand why she has the bleeding, suspects it is due to increased aspirin intake.  She is aware of and properly demonstrates adequate understanding of her current medical condition, treatment, possible benefits, and risks of that treatment.  She further is able to state if she becomes sick, and chooses not to have procedure " the worst thing that could happen is I die.  There are other things that can happen, but that is the worst.  That is why we chose to receive the blood  transfusion.  And agreed to procedure.  Patient is able to demonstrate proper insight by appreciating the seriousness of her condition including risk of death without receiving proper treatment.  She reports that she has been in the hospital on and off for several years, and would just like to go home.  She would like to go ahead and had the procedure performed.  She is alert and oriented x 4, calm and cooperative, very pleasant during this evaluation.  She does endorse some low mood, and frustration related to her current condition.  She denies suicidal ideations, homicidal ideations, and or auditory or visual hallucinations.  She further denies any history of suicide attempt, suicidal ideation, and or nonsuicidal self-injurious behavior.   Patient with limited support system and instability. Patient does have some mental capacity to make decisions however she continues to have poor judgment into to her mental condition as she is relying primarily on her son to help her make decisions.  There appears to be great conflict and bias regarding health care and medical providers due to Unfavorable evaluations of seeking medical care, stress factors, and overall avoidance based off experience. She does appear to be hopeful about getting assistance for nursing services and speaking with Palliative.    # Capacity evaluation to refuse medical care and treatment  Capacity evaluation is time and decision specific (please see relevant uptodate article here: [email or web address]). Patients often have capacity to make some medical decisions (ie refuse blood) but not  others (ie refuse emergent procedures). It also fluctuates through the course of admission (ie when someone comes into the hospital with UTI delirium whose mental status improves after 24 hours of antibiotics).   Mental Capacity Assessment: I have evaluated the following areas to assess the [name]  [name]'s mental capacity regarding medical decision-making ability which pertains to competency to accept or refuse medical treatment.   The specific treatment or service in question is: refusing blood products and procedures.  Conclusion: At this time, there is insufficient evidence to warrant removal of the patient's rights for medical decision-making. She can clearly determine mental capacity for decision-making due to presence of underlying acute transient etiology (diverticulosis of the colon with hemorrhage and acute blood loss).  At this time, we can determine that the patient does have functional mental capacity for medical decision-making including the right to accept or refuse treatment.   A long discussion was had with patient regarding her medical complaints and her prognosis if she chooses not to engage in embolization or treatment for her blood loss.  Patient expresses an understanding of both her medical condition and the proposed plan.  Patient is able to express a choice to proceed with treatment and speak with PMT. She remains oppositional about therapy at this time but will recommend consult for palliative medicine.  Patient has an appreciation of the fact that she may be harmed if she refuses blood.  Patient is able to reason with writer and explain why she has made her decision the way she has made it.  For these reasons writer feels that patient has capacity at this point in time to participate in treatment.    -Recommend palliative medicine consult to establish goals of care, at patient's request. Psychiatry to sign off.

## 2021-11-17 NOTE — Progress Notes (Signed)
PROGRESS NOTE    [name]  ZOX:[id number] DOB: 10-13-1929 DOA: 11/14/2021 PCP: Cassandria Anger, MD   Brief Narrative:  [name] is a 88 y.o. female with medical history significant of HTN, PPM, hypothyroidism, prior PE.   Pt presented to ED with BRBPR.  Not on blood thinners other than ASA.  No CP, no abd pain, no N/V.  Admitted under hospitalist service with rectal bleeding and acute blood loss anemia.  Details below.  Assessment & Plan:   Principal Problem:   Diverticulosis of colon with hemorrhage Active Problems:   ABLA (acute blood loss anemia)   Hypothyroidism   Hypocalcemia  Rectal bleeding from diverticulosis of colon/acute blood loss anemia: Bleeding diverticula demonstrated in ascending colon on CTA of abdomen and pelvis upon admission.  IR was consulted and reportedly, whole team was ready to perform the embolization but family declined the procedure and wanted conservative management.  Long discussion with the son and patient which is documented in the progress note of 11/15/2021.  Per patient, she has had only 1 small bloody bowel movement in last 24 hours.  Her hemoglobin remained stable between 7.5-8 until this morning when it dropped to 6.8.  Before going to see this patient, I called patient's son [name] as he had requested in the past to give him a call before in transfusion although he had verbally consented for it but when I called him this morning, he deferred the decision to patient.  When rounded on the patient, explained to the patient in details how important it is to give her blood transfusion now that her hemoglobin is 7.  However, patient refused blood transfusion and she wanted to talk to the son [name].  I did explain to her that with severe anemia, she could be at risk of stroke, MI and death as well.  After talking to the son, patient informed the nurse that she does not want any blood transfusion.  At that point in time, this raised red flag for patient  not having mental capacity to make decisions for herself which prompted psychiatry consult.  About 30 to 45 minutes later, patient's son showed up at the bedside, I was informed by primary RN that he asked many questions, about advantages and his duodenal disease of anemia as well as blood transfusion and that RN had answered all the questions that she could in her capacity.  I also had answered several questions other day about blood transfusion to patient, her son and I answered several questions again earlier today when I met with the patient as well as spoke to the son over the phone.  After all of this, son and patient agreed to give blood transfusion a try.  I was informed by the nurse that they are okay with blood transfusion.  But I still called [name] again at around 1:40 PM and he did verify that they are consenting for the blood transfusion at this point in time.  He was thankful for the call.  Hypocalcemia: Improved.  Monitor.  If needed, replace.  Hypothyroidism: TSH significantly elevated 19.2.  I discussed with the patient other day and today again and recommended treatment for this but she refused.  Hypokalemia: 3.3 yesterday, this was replaced.  BMP pending today.  DVT prophylaxis: SCDs Start: 11/14/21 2319   Code Status: Full Code  Family Communication: None present at bedside.  Plan of care discussed with patient in length and he/she verbalized understanding and agreed with it.  Status  is: Inpatient Remains inpatient appropriate because: Actively bleeding and needs blood transfusion.   Estimated body mass index is 18.88 kg/m as calculated from the following:   Height as of this encounter: 5' 4"  (1.626 m).   Weight as of this encounter: 49.9 kg.  Pressure Injury 11/27/20 Sacrum Stage 2 -  Partial thickness loss of dermis presenting as a shallow open injury with a red, pink wound bed without slough. (Active)  11/27/20 1250  Location: Sacrum  Location Orientation:   Staging:  Stage 2 -  Partial thickness loss of dermis presenting as a shallow open injury with a red, pink wound bed without slough.  Wound Description (Comments):   Present on Admission: -- (unknown)   Nutritional Assessment: Body mass index is 18.88 kg/m.[name] Seen by dietician.  I agree with the assessment and plan as outlined below: Nutrition Status:        . Skin Assessment: I have examined the patient's skin and I agree with the wound assessment as performed by the wound care RN as outlined below:  Consultants:  IR  Procedures:  None  Antimicrobials:  Anti-infectives (From admission, onward)    None         Subjective:  Patient seen and examined.  She was eating breakfast.  She had no complaint.  She has not had any bleeding in the last 48 hours is what she claims.  Further discussion as above.  Objective: Vitals:   11/16/21 1315 11/16/21 2058 11/17/21 0427 11/17/21 1325  BP: 125/70 128/76 (!) 144/71 100/75  Pulse: (!) 59 65 62 65  Resp: 18 17 16 16   Temp: 99 F (37.2 C) 99.3 F (37.4 C) 98.4 F (36.9 C) 98.6 F (37 C)  TempSrc: Oral Oral Oral Oral  SpO2: 100% 100% 100% 100%  Weight:      Height:        Intake/Output Summary (Last 24 hours) at 11/17/2021 1343 Last data filed at 11/17/2021 0900 Gross per 24 hour  Intake 240 ml  Output --  Net 240 ml    Filed Weights   11/14/21 1824  Weight: 49.9 kg    Examination:  General exam: Appears calm and comfortable  Respiratory system: Clear to auscultation. Respiratory effort normal. Cardiovascular system: S1 & S2 heard, RRR. No JVD, murmurs, rubs, gallops or clicks. No pedal edema. Gastrointestinal system: Abdomen is nondistended, soft and nontender. No organomegaly or masses felt. Normal bowel sounds heard. Central nervous system: Alert and oriented. No focal neurological deficits. Extremities: Symmetric 5 x 5 power. Skin: No rashes, lesions or ulcers.  Psychiatry: Judgement and insight appear poor.  Data  Reviewed: I have personally reviewed following labs and imaging studies  CBC: Recent Labs  Lab 11/14/21 1917 11/14/21 2348 11/15/21 0423 11/15/21 0737 11/15/21 1505 11/15/21 1937 11/16/21 0400 11/16/21 1553 11/17/21 0352  WBC 5.2 5.8 4.8  --   --   --  3.5* 3.9* 4.4  NEUTROABS 3.4  --   --   --   --   --   --   --   --   HGB 9.2* 8.9* 7.6*   < > 7.8* 7.9* 7.5* 7.8* 6.8*  HCT 28.9* 27.2* 23.5*   < > 24.4* 25.2* 23.6* 24.2* 21.6*  MCV 104.3* 102.3* 101.3*  --   --   --  104.9* 103.4* 104.3*  PLT 244 268 209  --   --   --  210 236 210   < > = values in this interval  not displayed.    Basic Metabolic Panel: Recent Labs  Lab 11/14/21 1917 11/14/21 2348 11/15/21 0423 11/16/21 0400  NA 141 137 139 142  K 2.9* 4.1 3.8 3.3*  CL 117* 109 111 117*  CO2 19* 25 25 21*  GLUCOSE 84 107* 89 78  BUN 20 25* 24* 17  CREATININE 0.47 0.69 0.65 0.71  CALCIUM 5.9* 7.7* 7.5* 8.0*    GFR: Estimated Creatinine Clearance: 37.6 mL/min (by C-G formula based on SCr of 0.71 mg/dL). Liver Function Tests: Recent Labs  Lab 11/14/21 1917  AST 13*  ALT 9  ALKPHOS 37*  BILITOT 0.6  PROT 4.0*  ALBUMIN 2.0*    No results for input(s): "LIPASE", "AMYLASE" in the last 168 hours. No results for input(s): "AMMONIA" in the last 168 hours. Coagulation Profile: Recent Labs  Lab 11/14/21 2023  INR 1.2    Cardiac Enzymes: No results for input(s): "CKTOTAL", "CKMB", "CKMBINDEX", "TROPONINI" in the last 168 hours. BNP (last 3 results) No results for input(s): "PROBNP" in the last 8760 hours. HbA1C: No results for input(s): "HGBA1C" in the last 72 hours. CBG: No results for input(s): "GLUCAP" in the last 168 hours. Lipid Profile: No results for input(s): "CHOL", "HDL", "LDLCALC", "TRIG", "CHOLHDL", "LDLDIRECT" in the last 72 hours. Thyroid Function Tests: Recent Labs    11/15/21 0026  TSH 19.296*    Anemia Panel: No results for input(s): "VITAMINB12", "FOLATE", "FERRITIN", "TIBC",  "IRON", "RETICCTPCT" in the last 72 hours. Sepsis Labs: Recent Labs  Lab 11/14/21 1822  LATICACIDVEN 0.9     No results found for this or any previous visit (from the past 240 hour(s)).   Radiology Studies: No results found.  Scheduled Meds:  sodium chloride   Intravenous Once    Continuous Infusions:  sodium chloride 125 mL/hr at 11/17/21 0355     LOS: 3 days   Darliss Cheney, MD [facility]  11/17/2021, 1:43 PM   *Please note that this is a verbal dictation therefore any spelling or grammatical errors are due to the "[place] [employer]" system interpretation.  Please page via [place] and do not message via secure chat for urgent patient care matters. Secure chat can be used for non urgent patient care matters.  How to contact the [facility] Attending or Consulting provider [place] or covering provider during after hours [place], for this patient?  Check the care team in [facility] and look for a) attending/consulting [facility] provider listed and b) the [facility] team listed. Page or secure chat [phone]. Log into [email or web address] and use [place]'s universal password to access. If you do not have the password, please contact the hospital operator. Locate the [facility] provider you are looking for under [facility] and page to a number that you can be directly reached. If you still have difficulty reaching the provider, please page the [facility] (Director on Call) for the Hospitalists listed on amion for assistance.

## 2021-11-18 DIAGNOSIS — K5731 Diverticulosis of large intestine without perforation or abscess with bleeding: Secondary | ICD-10-CM | POA: Diagnosis not present

## 2021-11-18 LAB — BPAM RBC
Blood Product Expiration Date: 202308112359
ISSUE DATE / TIME: 202307252307
Unit Type and Rh: 6200

## 2021-11-18 LAB — TYPE AND SCREEN
ABO/RH(D): A POS
Antibody Screen: NEGATIVE
Unit division: 0

## 2021-11-18 LAB — CBC
HCT: 26.2 % — ABNORMAL LOW (ref 36.0–46.0)
Hemoglobin: 8.5 g/dL — ABNORMAL LOW (ref 12.0–15.0)
MCH: 32 pg (ref 26.0–34.0)
MCHC: 32.4 g/dL (ref 30.0–36.0)
MCV: 98.5 fL (ref 80.0–100.0)
Platelets: 216 10*3/uL (ref 150–400)
RBC: 2.66 MIL/uL — ABNORMAL LOW (ref 3.87–5.11)
RDW: 16.9 % — ABNORMAL HIGH (ref 11.5–15.5)
WBC: 3.8 10*3/uL — ABNORMAL LOW (ref 4.0–10.5)
nRBC: 0 % (ref 0.0–0.2)

## 2021-11-18 NOTE — Care Management Important Message (Signed)
Important Message  Patient Details IM Letter given to the Patient. Name: [name] MRN: [id number] Date of Birth: [birth year]   Medicare Important Message Given:  Yes     Kerin Salen 11/18/2021, 10:30 AM

## 2021-11-18 NOTE — TOC Transition Note (Signed)
Transition of Care [facility]) - CM/SW Discharge Note   Patient Details  Name: Ceciley Buist MRN: [id number] Date of Birth: [birth year]  Transition of Care [facility]) CM/SW Contact:  Leeroy Cha, RN Phone Number: 11/18/2021, 11:02 AM   Clinical Narrative:    Patient discharged to return to home with self care. Orders checked for toc needs none found.   Final next level of care: Home/Self Care Barriers to Discharge: Barriers Resolved   Patient Goals and CMS Choice Patient states their goals for this hospitalization and ongoing recovery are:: to go home CMS Medicare.gov Compare Post Acute Care list provided to:: Patient Choice offered to / list presented to : Patient  Discharge Placement                       Discharge Plan and Services   Discharge Planning Services: CM Consult Post Acute Care Choice: Home Health                               Social Determinants of Health (SDOH) Interventions     Readmission Risk Interventions     No data to display

## 2021-11-18 NOTE — Discharge Summary (Signed)
Physician Discharge Summary  [name] BTD:[id number] DOB: 1929-05-02 DOA: 11/14/2021  PCP: Cassandria Anger, MD  Admit date: 11/14/2021 Discharge date: 11/18/2021  Admitted From: home Discharge disposition: home   Recommendations for Outpatient Follow-Up:   Refusing any further interventions-- spoke with patient and son   Discharge Diagnosis:   Principal Problem:   Diverticulosis of colon with hemorrhage Active Problems:   ABLA (acute blood loss anemia)   Hypothyroidism   Hypocalcemia    Discharge Condition: Improved.  Diet recommendation:  Regular.  Wound care: None.  Code status: Full.   History of Present Illness:   [name] is a 88 y.o. female with medical history significant of HTN, PPM, hypothyroidism, prior PE.   Pt presents to ED with BRBPR.  Not on blood thinners other than ASA.  No CP, no abd pain, no N/V.  Symptoms onset earlier today.  Amount of blood seems to be decreasing since onset.   Hospital Course by Problem:   Rectal bleeding from diverticulosis of colon/acute blood loss anemia: Bleeding diverticula demonstrated in ascending colon on CTA of abdomen and pelvis upon admission.  IR was consulted and reportedly, whole team was ready to perform the embolization but family declined the procedure and wanted conservative management.  Long discussion with the son and patient which is documented in the progress note of 11/15/2021.  No further bleeding and patient does not want any intervention if it does happen   Hypocalcemia: Improved.    Hypothyroidism: TSH significantly elevated 19.2. Dr. Jonathon Bellows discussed with the patient other day and today again and recommended treatment for this but she refused.   Hypokalemia: -replete    Medical Consultants:   IR    Discharge Exam:   Vitals:   11/17/21 2327 11/18/21 0205  BP: (!) 142/70 (!) 149/79  Pulse:    Resp: 16 15  Temp: 99.3 F (37.4 C) 98.7 F (37.1 C)  SpO2: 100% 100%    Vitals:   11/17/21 2057 11/17/21 2302 11/17/21 2327 11/18/21 0205  BP: (!) 152/78 139/75 (!) 142/70 (!) 149/79  Pulse: 60 60    Resp: '17 19 16 15  '$ Temp: 99.9 F (37.7 C) 99.6 F (37.6 C) 99.3 F (37.4 C) 98.7 F (37.1 C)  TempSrc: Oral Oral Oral Oral  SpO2: 100% 100% 100% 100%  Weight:      Height:        General exam: Appears calm and comfortable.   The results of significant diagnostics from this hospitalization (including imaging, microbiology, ancillary and laboratory) are listed below for reference.     Procedures and Diagnostic Studies:   CT Head Wo Contrast  Result Date: 11/14/2021 CLINICAL DATA:  Mental status change of unknown cause. Syncopal event. EXAM: CT HEAD WITHOUT CONTRAST TECHNIQUE: Contiguous axial images were obtained from the base of the skull through the vertex without intravenous contrast. RADIATION DOSE REDUCTION: This exam was performed according to the departmental dose-optimization program which includes automated exposure control, adjustment of the mA and/or kV according to patient size and/or use of iterative reconstruction technique. COMPARISON:  12/01/2020 FINDINGS: Brain: Diffuse cerebral atrophy. Ventricular dilatation consistent with central atrophy. Low-attenuation changes in the deep white matter consistent with small vessel ischemia. No abnormal extra-axial fluid collections. No mass effect or midline shift. Gray-white matter junctions are distinct. Basal cisterns are not effaced. No acute intracranial hemorrhage. Vascular: No hyperdense vessel or unexpected calcification. Skull: Normal. Negative for fracture or focal lesion. Sinuses/Orbits: No acute finding. Other:  None. IMPRESSION: No acute intracranial abnormalities. Chronic atrophy and small vessel ischemic changes. Electronically Signed   By: Lucienne Capers M.D.   On: 11/14/2021 22:15   CT ANGIO GI BLEED  Result Date: 11/14/2021 CLINICAL DATA:  Acute mesenteric ischemia. Rectal and vaginal  bleeding for 1 day. Syncopal episode. EXAM: CTA ABDOMEN AND PELVIS WITHOUT AND WITH CONTRAST TECHNIQUE: Multidetector CT imaging of the abdomen and pelvis was performed using the standard protocol during bolus administration of intravenous contrast. Multiplanar reconstructed images and MIPs were obtained and reviewed to evaluate the vascular anatomy. RADIATION DOSE REDUCTION: This exam was performed according to the departmental dose-optimization program which includes automated exposure control, adjustment of the mA and/or kV according to patient size and/or use of iterative reconstruction technique. CONTRAST:  129m OMNIPAQUE IOHEXOL 350 MG/ML SOLN COMPARISON:  CT abdomen and pelvis 11/06/2020 FINDINGS: VASCULAR Aorta: Unenhanced images demonstrate scattered aortic calcification. Contrast-enhanced images demonstrate patent abdominal aorta without aneurysm or dissection. Celiac: Patent without evidence of aneurysm, dissection, vasculitis or significant stenosis. SMA: The superior mesenteric artery arises from the celiac trunk. Patent without evidence of aneurysm, dissection, vasculitis or significant stenosis. Renals: Both renal arteries are patent without evidence of aneurysm, dissection, vasculitis, fibromuscular dysplasia or significant stenosis. IMA: Patent without evidence of aneurysm, dissection, vasculitis or significant stenosis. Inflow: Patent without evidence of aneurysm, dissection, vasculitis or significant stenosis. Proximal Outflow: Bilateral common femoral and visualized portions of the superficial and profunda femoral arteries are patent without evidence of aneurysm, dissection, vasculitis or significant stenosis. Veins: No obvious venous abnormality within the limitations of this arterial phase study. Review of the MIP images confirms the above findings. NON-VASCULAR Lower chest: Mild atelectasis in the lung bases. Mild cardiac enlargement. Hepatobiliary: No focal liver lesions. Cholelithiasis. No  evidence of gallbladder wall thickening or inflammation. No bile duct dilatation. Pancreas: Unremarkable. No pancreatic ductal dilatation or surrounding inflammatory changes. Spleen: Normal in size without focal abnormality. Adrenals/Urinary Tract: No adrenal gland nodule. Benign simple appearing cyst on the left kidney measuring 2.6 cm diameter. No additional imaging follow-up is indicated for this typically benign-appearing lesion. No change since prior study. No hydronephrosis. Renal nephrograms are symmetrical. Bladder is normal. Stomach/Bowel: Stomach, small bowel, and colon are not abnormally distended. Stool seen diffusely throughout the colon. Focal contrast pooling is suggested in a diverticulum of the ascending colon, not present on the unenhanced image and mildly diffuse on the delayed image. This likely represents acute hemorrhage within a diverticulum. No associated mass or wall thickening is seen. Lymphatic: No significant lymphadenopathy. Reproductive: Status post hysterectomy. No adnexal masses. Other: No abdominal wall hernia or abnormality. No abdominopelvic ascites. Musculoskeletal: No acute or significant osseous findings. IMPRESSION: VASCULAR Aortic atherosclerosis. No aneurysm or dissection. No large vessel occlusion. NON-VASCULAR Focal active intraluminal hemorrhage demonstrated in the ascending colon consistent with a hemorrhagic diverticulum. Cholelithiasis without evidence of acute cholecystitis. Electronically Signed   By: WLucienne CapersM.D.   On: 11/14/2021 22:13     Labs:   Basic Metabolic Panel: Recent Labs  Lab 11/14/21 1917 11/14/21 2348 11/15/21 0423 11/16/21 0400 11/17/21 1400  NA 141 137 139 142 142  K 2.9* 4.1 3.8 3.3* 3.8  CL 117* 109 111 117* 117*  CO2 19* 25 25 21* 22  GLUCOSE 84 107* 89 78 110*  BUN 20 25* 24* 17 17  CREATININE 0.47 0.69 0.65 0.71 0.80  CALCIUM 5.9* 7.7* 7.5* 8.0* 8.0*   GFR Estimated Creatinine Clearance: 37.6 mL/min (by C-G formula  based on  SCr of 0.8 mg/dL). Liver Function Tests: Recent Labs  Lab 11/14/21 1917  AST 13*  ALT 9  ALKPHOS 37*  BILITOT 0.6  PROT 4.0*  ALBUMIN 2.0*   No results for input(s): "LIPASE", "AMYLASE" in the last 168 hours. No results for input(s): "AMMONIA" in the last 168 hours. Coagulation profile Recent Labs  Lab 11/14/21 2023  INR 1.2    CBC: Recent Labs  Lab 11/14/21 1917 11/14/21 2348 11/15/21 0423 11/15/21 0737 11/15/21 1937 11/16/21 0400 11/16/21 1553 11/17/21 0352 11/18/21 0416  WBC 5.2   < > 4.8  --   --  3.5* 3.9* 4.4 3.8*  NEUTROABS 3.4  --   --   --   --   --   --   --   --   HGB 9.2*   < > 7.6*   < > 7.9* 7.5* 7.8* 6.8* 8.5*  HCT 28.9*   < > 23.5*   < > 25.2* 23.6* 24.2* 21.6* 26.2*  MCV 104.3*   < > 101.3*  --   --  104.9* 103.4* 104.3* 98.5  PLT 244   < > 209  --   --  210 236 210 216   < > = values in this interval not displayed.   Cardiac Enzymes: No results for input(s): "CKTOTAL", "CKMB", "CKMBINDEX", "TROPONINI" in the last 168 hours. BNP: Invalid input(s): "POCBNP" CBG: No results for input(s): "GLUCAP" in the last 168 hours. D-Dimer No results for input(s): "DDIMER" in the last 72 hours. Hgb A1c No results for input(s): "HGBA1C" in the last 72 hours. Lipid Profile No results for input(s): "CHOL", "HDL", "LDLCALC", "TRIG", "CHOLHDL", "LDLDIRECT" in the last 72 hours. Thyroid function studies No results for input(s): "TSH", "T4TOTAL", "T3FREE", "THYROIDAB" in the last 72 hours.  Invalid input(s): "FREET3" Anemia work up No results for input(s): "VITAMINB12", "FOLATE", "FERRITIN", "TIBC", "IRON", "RETICCTPCT" in the last 72 hours. Microbiology No results found for this or any previous visit (from the past 240 hour(s)).   Discharge Instructions:   Discharge Instructions     Diet general   Complete by: As directed    Increase activity slowly   Complete by: As directed       Allergies as of 11/18/2021       Reactions   Tilactase  Diarrhea   Amlodipine Besy-benazepril Hcl Swelling   Amlodipine Besylate Swelling   Clonidine Hydrochloride    REACTION: tired   Lactose Intolerance (gi) Diarrhea, Other (See Comments)   Valsartan Other (See Comments)   Tongue swelling    Verapamil Other (See Comments)   unknown        Medication List    You have not been prescribed any medications.       Time coordinating discharge: 45 min  Signed:  Geradine Girt DO  [facility] 11/18/2021, 8:54 AM

## 2021-11-18 NOTE — Plan of Care (Signed)
Discharge instructions reviewed with patient, questions answered, verbalized understanding.  Taken to front entrance to be taken home by daughter.  Allbelongings with patient.

## 2021-11-18 NOTE — Progress Notes (Signed)
Call placed to son Bland Span to notify him of mothers discharge, no answer, left message.

## 2021-11-18 NOTE — Evaluation (Signed)
Occupational Therapy Evaluation Patient Details Name: [name] MRN: [id number] DOB: 27-Oct-1929 Today's Date: 11/18/2021   History of Present Illness 88 yo female admitted with divertivulosis, anemia, general weakness. Hx of CKD, CHF, depression, anxiety   Clinical Impression   Patient is a 88 year old female who was admitted for above. Patient is eager to go home today with fixation on getting things ready so she it prepared for family to take her home. Patient was supervision for toileting tasks, transfer, LB dressing and UB dressing with set up sitting EOB. Patient reported feeling chronically dizzy at this time. Patient would benefit from participation in ADL tasks with supervision for safety cues in next level of care. Patient would continue to benefit from skilled OT services at this time while admitted and after d/c to address noted deficits in order to improve overall safety and independence in ADLs.       Recommendations for follow up therapy are one component of a multi-disciplinary discharge planning process, led by the attending physician.  Recommendations may be updated based on patient status, additional functional criteria and insurance authorization.   Follow Up Recommendations  Home health OT    Assistance Recommended at Discharge Frequent or constant Supervision/Assistance  Patient can return home with the following A little help with walking and/or transfers;A little help with bathing/dressing/bathroom;Assistance with cooking/housework;Direct supervision/assist for financial management;Assist for transportation;Help with stairs or ramp for entrance;Direct supervision/assist for medications management    Functional Status Assessment  Patient has had a recent decline in their functional status and demonstrates the ability to make significant improvements in function in a reasonable and predictable amount of time.  Equipment Recommendations  None recommended by OT     Recommendations for Other Services       Precautions / Restrictions Precautions Precautions: Fall Restrictions Weight Bearing Restrictions: No      Mobility Bed Mobility Overal bed mobility: Needs Assistance Bed Mobility: Supine to Sit     Supine to sit: Supervision     General bed mobility comments: Supv for safety, lines. Pt reports dizziness.    Transfers                          Balance Overall balance assessment: Needs assistance         Standing balance support: Bilateral upper extremity supported, During functional activity, Reliant on assistive device for balance Standing balance-Leahy Scale: Poor                             ADL either performed or assessed with clinical judgement   ADL Overall ADL's : At baseline                                       General ADL Comments: patient is supervision for transfers, LB dressing, Ub dressing, toileting tasks and hygiene in standing with increased safety cues. patient reported plan to transition home with son support. patient would benfeit from 24/7 supervision caregiver assist.     Vision Patient Visual Report: No change from baseline       Perception     Praxis      Pertinent Vitals/Pain Pain Assessment Pain Assessment: No/denies pain     Hand Dominance     Extremity/Trunk Assessment Upper Extremity Assessment Upper Extremity Assessment: Generalized weakness  Lower Extremity Assessment Lower Extremity Assessment: Defer to PT evaluation   Cervical / Trunk Assessment Cervical / Trunk Assessment: Normal   Communication     Cognition Arousal/Alertness: Awake/alert Behavior During Therapy: WFL for tasks assessed/performed Overall Cognitive Status: Within Functional Limits for tasks assessed                                       General Comments       Exercises     Shoulder Instructions      Home Living Family/patient expects to  be discharged to:: Private residence Living Arrangements: Children Available Help at Discharge: Family;Available PRN/intermittently Type of Home: House Home Access: Stairs to enter Entrance Stairs-Number of Steps: 3   Home Layout: Bed/bath upstairs;Two level Alternate Level Stairs-Number of Steps: 1 flight                        Prior Functioning/Environment Prior Level of Function : Independent/Modified Independent               ADLs Comments: reported living at ALFs but recently transitioning home with family at this time.        OT Problem List: Decreased activity tolerance;Impaired balance (sitting and/or standing);Decreased safety awareness;Decreased knowledge of precautions;Decreased knowledge of use of DME or AE      OT Treatment/Interventions: Self-care/ADL training;Therapeutic exercise;Neuromuscular education;DME and/or AE instruction;Energy conservation;Therapeutic activities;Balance training;Patient/family education    OT Goals(Current goals can be found in the care plan section) Acute Rehab OT Goals Patient Stated Goal: to go home with son today OT Goal Formulation: With patient Time For Goal Achievement: 12/02/21 Potential to Achieve Goals: Fair  OT Frequency: Min 1X/week    Co-evaluation              AM-PAC OT "6 Clicks" Daily Activity     Outcome Measure Help from another person eating meals?: None Help from another person taking care of personal grooming?: None Help from another person toileting, which includes using toliet, bedpan, or urinal?: A Little Help from another person bathing (including washing, rinsing, drying)?: A Little Help from another person to put on and taking off regular upper body clothing?: A Little Help from another person to put on and taking off regular lower body clothing?: A Little 6 Click Score: 20   End of Session Nurse Communication: Mobility status  Activity Tolerance: Patient tolerated treatment  well Patient left: in chair;with call bell/phone within reach;with nursing/sitter in room  OT Visit Diagnosis: Unsteadiness on feet (R26.81);Other abnormalities of gait and mobility (R26.89);Muscle weakness (generalized) (M62.81)                Time: 0623-7628 OT Time Calculation (min): 23 min Charges:  OT General Charges $OT Visit: 1 Visit OT Evaluation $OT Eval Moderate Complexity: 1 Mod OT Treatments $Self Care/Home Management : 8-22 mins  Jackelyn Poling OTR/L, MS Acute Rehabilitation Department Office# [phone] Pager# [phone]   Marcellina Millin 11/18/2021, 10:04 AM

## 2023-02-11 ENCOUNTER — Telehealth: Payer: Self-pay

## 2023-02-11 NOTE — Telephone Encounter (Signed)
Pt has paperwork that is needing to be filled out. However, Pt has not seen Dr. Posey Rea here recently and Dr. Posey Rea is asking that the pt be seen either in office or VV.  I LVM for pt to call the pt and set this appointment up as soon as she can.

## 2023-02-25 NOTE — Telephone Encounter (Signed)
I spoke with the pts daughter and she has stated the pt will not be coming in to see PCP. Pts daughter has stated that the pts "son does ot believe in her seeing a doctor." Pts daughter has stated "she sill her brother know that [name] mom needs an appointment to be seen in order for PT orders to be signed off on."

## 2023-03-10 ENCOUNTER — Encounter: Payer: Self-pay | Admitting: Internal Medicine

## 2023-03-10 ENCOUNTER — Ambulatory Visit (INDEPENDENT_AMBULATORY_CARE_PROVIDER_SITE_OTHER): Payer: Medicare Other | Admitting: Internal Medicine

## 2023-03-10 VITALS — BP 110/70 | HR 74 | Temp 97.9°F | Ht 64.0 in | Wt 118.0 lb

## 2023-03-10 DIAGNOSIS — K625 Hemorrhage of anus and rectum: Secondary | ICD-10-CM

## 2023-03-10 DIAGNOSIS — N3289 Other specified disorders of bladder: Secondary | ICD-10-CM | POA: Diagnosis not present

## 2023-03-10 DIAGNOSIS — N1831 Chronic kidney disease, stage 3a: Secondary | ICD-10-CM

## 2023-03-10 DIAGNOSIS — E559 Vitamin D deficiency, unspecified: Secondary | ICD-10-CM | POA: Diagnosis not present

## 2023-03-10 DIAGNOSIS — R202 Paresthesia of skin: Secondary | ICD-10-CM | POA: Diagnosis not present

## 2023-03-10 DIAGNOSIS — I272 Pulmonary hypertension, unspecified: Secondary | ICD-10-CM | POA: Diagnosis not present

## 2023-03-10 DIAGNOSIS — E039 Hypothyroidism, unspecified: Secondary | ICD-10-CM

## 2023-03-10 DIAGNOSIS — R269 Unspecified abnormalities of gait and mobility: Secondary | ICD-10-CM | POA: Diagnosis not present

## 2023-03-10 DIAGNOSIS — F411 Generalized anxiety disorder: Secondary | ICD-10-CM

## 2023-03-10 LAB — CBC WITH DIFFERENTIAL/PLATELET
Eosinophils Relative: 0.6 % (ref 0.0–5.0)
HCT: 41.9 % (ref 36.0–46.0)
Hemoglobin: 13.8 g/dL (ref 12.0–15.0)
Lymphocytes Relative: 21.5 % (ref 12.0–46.0)
MCHC: 32.9 g/dL (ref 30.0–36.0)
MCV: 100.9 fl — ABNORMAL HIGH (ref 78.0–100.0)
Monocytes Relative: 6.8 % (ref 3.0–12.0)
Neutrophils Relative %: 69.9 % (ref 43.0–77.0)
Platelets: 221 10*3/uL (ref 150.0–400.0)
RBC: 4.16 Mil/uL (ref 3.87–5.11)
RDW: 14.9 % (ref 11.5–15.5)
WBC: 3.5 10*3/uL — ABNORMAL LOW (ref 4.0–10.5)

## 2023-03-10 LAB — SEDIMENTATION RATE: Sed Rate: 19 mm/h (ref 0–30)

## 2023-03-10 LAB — COMPREHENSIVE METABOLIC PANEL WITH GFR
ALT: 12 U/L (ref 0–35)
AST: 21 U/L (ref 0–37)
Albumin: 4.2 g/dL (ref 3.5–5.2)
Alkaline Phosphatase: 86 U/L (ref 39–117)
BUN: 32 mg/dL — ABNORMAL HIGH (ref 6–23)
CO2: 30 meq/L (ref 19–32)
Calcium: 9.5 mg/dL (ref 8.4–10.5)
Chloride: 102 meq/L (ref 96–112)
Creatinine, Ser: 1.08 mg/dL (ref 0.40–1.20)
GFR: 45.03 mL/min — ABNORMAL LOW
Glucose, Bld: 89 mg/dL (ref 70–99)
Potassium: 4.1 meq/L (ref 3.5–5.1)
Sodium: 138 meq/L (ref 135–145)
Total Bilirubin: 1.7 mg/dL — ABNORMAL HIGH (ref 0.2–1.2)
Total Protein: 7.7 g/dL (ref 6.0–8.3)

## 2023-03-10 LAB — URINALYSIS
Bilirubin Urine: NEGATIVE
Ketones, ur: NEGATIVE
Leukocytes,Ua: NEGATIVE
Nitrite: NEGATIVE
Specific Gravity, Urine: 1.015 (ref 1.000–1.030)
Urine Glucose: NEGATIVE
Urobilinogen, UA: 0.2 (ref 0.0–1.0)
pH: 6.5 (ref 5.0–8.0)

## 2023-03-10 LAB — T4, FREE: Free T4: 0.65 ng/dL (ref 0.60–1.60)

## 2023-03-10 LAB — TSH: TSH: 11.28 u[IU]/mL — ABNORMAL HIGH (ref 0.35–5.50)

## 2023-03-10 LAB — VITAMIN B12: Vitamin B-12: 306 pg/mL (ref 211–911)

## 2023-03-10 LAB — VITAMIN D 25 HYDROXY (VIT D DEFICIENCY, FRACTURES): VITD: 14.97 ng/mL — ABNORMAL LOW (ref 30.00–100.00)

## 2023-03-10 NOTE — Assessment & Plan Note (Addendum)
Chronic C/o blood in stool - small amounts. Per hx: "Rectal bleeding from diverticulosis of colon/acute blood loss anemia: Bleeding diverticula demonstrated in ascending colon on CTA of abdomen and pelvis upon admission.  IR was consulted and reportedly, whole team was ready to perform the embolization but family declined the procedure and wanted conservative management.  Long discussion with the son and patient which is documented in the progress note of 11/15/2021.  No further bleeding and patient does not want any intervention if it does happen"  Check CBC, INR, iron  [facility] ref

## 2023-03-10 NOTE — Assessment & Plan Note (Signed)
Doing fair 

## 2023-03-10 NOTE — Assessment & Plan Note (Signed)
The patient had to move in with her daughter Myles Gip in in [place], [place]. Pam teaches school.  Pam is a high risk for bringing COVID-19 virus home and infection her mother.  We will fill out FMLA form to keep her out of work till April 30, 2019.

## 2023-03-10 NOTE — Assessment & Plan Note (Addendum)
She is not taking Levothroid.  Refused lab tests. Obtain FT4, TSH

## 2023-03-10 NOTE — Assessment & Plan Note (Signed)
In PT 

## 2023-03-10 NOTE — Assessment & Plan Note (Signed)
Monitor CMET Hydrate well

## 2023-03-10 NOTE — Assessment & Plan Note (Addendum)
Worse Discussed Pt lives at [facility] [employer] (senior living) - 1 bedroom condo. Her son Marcello Moores is in charge of her finances. She would like to talk to a SW

## 2023-03-10 NOTE — Progress Notes (Addendum)
Subjective:  Patient ID: [name], female    DOB: Apr 14, 1930  Age: 89 y.o. MRN: [id number]  CC: Medical Management of Chronic Issues   HPI [name] presents for a f/u Pt lives at [place] [facility] (senior living) - 1 bedroom condo. Her son Marcello Moores is in charge of her finances. She would like to talk to a SW C/o IBS, anxiety Not taking any meds She was in PT  C/o blood in stool - small amounts. Per hx: "Rectal bleeding from diverticulosis of colon/acute blood loss anemia: Bleeding diverticula demonstrated in ascending colon on CTA of abdomen and pelvis upon admission.  IR was consulted and reportedly, whole team was ready to perform the embolization but family declined the procedure and wanted conservative management.  Long discussion with the son and patient which is documented in the progress note of 11/15/2021.  No further bleeding and patient does not want any intervention if it does happen"   No outpatient medications prior to visit.   No facility-administered medications prior to visit.    ROS: Review of Systems  Constitutional:  Positive for fatigue. Negative for activity change, appetite change, chills and unexpected weight change.  HENT:  Negative for congestion, mouth sores and sinus pressure.   Eyes:  Negative for visual disturbance.  Respiratory:  Negative for cough and chest tightness.   Gastrointestinal:  Positive for abdominal pain. Negative for nausea.  Genitourinary:  Negative for difficulty urinating, frequency and vaginal pain.  Musculoskeletal:  Negative for back pain and gait problem.  Skin:  Negative for pallor and rash.  Neurological:  Negative for dizziness, tremors, weakness, numbness and headaches.  Psychiatric/Behavioral:  Negative for confusion, sleep disturbance and suicidal ideas. The patient is nervous/anxious.     Objective:  BP 110/70 (BP Location: Right Arm, Patient Position: Sitting, Cuff Size: Normal)   Pulse 74   Temp 97.9 F (36.6 C) (Oral)    Ht 5\' 4"  (1.626 m)   Wt 118 lb (53.5 kg)   SpO2 90%   BMI 20.25 kg/m   BP Readings from Last 3 Encounters:  03/10/23 110/70  11/18/21 (!) 149/79  05/14/21 120/80    Wt Readings from Last 3 Encounters:  03/10/23 118 lb (53.5 kg)  11/14/21 110 lb (49.9 kg)  05/14/21 102 lb (46.3 kg)    Physical Exam Constitutional:      General: She is not in acute distress.    Appearance: She is well-developed.  HENT:     Head: Normocephalic.     Right Ear: External ear normal.     Left Ear: External ear normal.     Nose: Nose normal.  Eyes:     General:        Right eye: No discharge.        Left eye: No discharge.     Conjunctiva/sclera: Conjunctivae normal.     Pupils: Pupils are equal, round, and reactive to light.  Neck:     Thyroid: No thyromegaly.     Vascular: No JVD.     Trachea: No tracheal deviation.  Cardiovascular:     Rate and Rhythm: Normal rate and regular rhythm.     Heart sounds: Normal heart sounds.  Pulmonary:     Effort: No respiratory distress.     Breath sounds: No stridor. No wheezing.  Abdominal:     General: Bowel sounds are normal. There is no distension.     Palpations: Abdomen is soft. There is no mass.  Tenderness: There is no abdominal tenderness. There is no guarding or rebound.  Musculoskeletal:        General: No tenderness.     Cervical back: Normal range of motion and neck supple. No rigidity.  Lymphadenopathy:     Cervical: No cervical adenopathy.  Skin:    Findings: No erythema or rash.  Neurological:     Cranial Nerves: No cranial nerve deficit.     Motor: No weakness or abnormal muscle tone.     Coordination: Coordination normal.     Gait: Gait normal.     Deep Tendon Reflexes: Reflexes normal.  Psychiatric:        Behavior: Behavior normal.        Thought Content: Thought content normal.        Judgment: Judgment normal.    Looks well, clean and well nourished Using a walker Alert and cooperative Pacemaker     A  total time of 45 minutes was spent preparing to see the patient, reviewing tests, x-rays, operative reports and other medical records.  Also, obtaining history and performing comprehensive physical exam.  Additionally, counseling the patient regarding the above listed issues, care issues. Finally, documenting clinical information in the health records, coordination of care, educating the patient.   Lab Results  Component Value Date   WBC 3.5 (L) 03/10/2023   HGB 13.8 03/10/2023   HCT 41.9 03/10/2023   PLT 221.0 03/10/2023   GLUCOSE 89 03/10/2023   CHOL 174 05/11/2010   TRIG 78.0 05/11/2010   HDL 52.30 05/11/2010   LDLDIRECT 130.8 09/30/2006   LDLCALC 106 (H) 05/11/2010   ALT 12 03/10/2023   AST 21 03/10/2023   NA 138 03/10/2023   K 4.1 03/10/2023   CL 102 03/10/2023   CREATININE 1.08 03/10/2023   BUN 32 (H) 03/10/2023   CO2 30 03/10/2023   TSH 11.28 (H) 03/10/2023   INR 1.2 11/14/2021   HGBA1C 6.0 04/10/2012    CT Head Wo Contrast  Result Date: 11/14/2021 CLINICAL DATA:  Mental status change of unknown cause. Syncopal event. EXAM: CT HEAD WITHOUT CONTRAST TECHNIQUE: Contiguous axial images were obtained from the base of the skull through the vertex without intravenous contrast. RADIATION DOSE REDUCTION: This exam was performed according to the departmental dose-optimization program which includes automated exposure control, adjustment of the mA and/or kV according to patient size and/or use of iterative reconstruction technique. COMPARISON:  12/01/2020 FINDINGS: Brain: Diffuse cerebral atrophy. Ventricular dilatation consistent with central atrophy. Low-attenuation changes in the deep white matter consistent with small vessel ischemia. No abnormal extra-axial fluid collections. No mass effect or midline shift. Gray-white matter junctions are distinct. Basal cisterns are not effaced. No acute intracranial hemorrhage. Vascular: No hyperdense vessel or unexpected calcification. Skull:  Normal. Negative for fracture or focal lesion. Sinuses/Orbits: No acute finding. Other: None. IMPRESSION: No acute intracranial abnormalities. Chronic atrophy and small vessel ischemic changes. Electronically Signed   By: Burman [name] M.D.   On: 11/14/2021 22:15   CT ANGIO GI BLEED  Result Date: 11/14/2021 CLINICAL DATA:  Acute mesenteric ischemia. Rectal and vaginal bleeding for 1 day. Syncopal episode. EXAM: CTA ABDOMEN AND PELVIS WITHOUT AND WITH CONTRAST TECHNIQUE: Multidetector CT imaging of the abdomen and pelvis was performed using the standard protocol during bolus administration of intravenous contrast. Multiplanar reconstructed images and MIPs were obtained and reviewed to evaluate the vascular anatomy. RADIATION DOSE REDUCTION: This exam was performed according to the departmental dose-optimization program which includes automated exposure control, adjustment  of the mA and/or kV according to patient size and/or use of iterative reconstruction technique. CONTRAST:  OMNIPAQUE IOHEXOL 350 MG/ML SOLN COMPARISON:  CT abdomen and pelvis 11/06/2020 FINDINGS: VASCULAR Aorta: Unenhanced images demonstrate scattered aortic calcification. Contrast-enhanced images demonstrate patent abdominal aorta without aneurysm or dissection. Celiac: Patent without evidence of aneurysm, dissection, vasculitis or significant stenosis. SMA: The superior mesenteric artery arises from the celiac trunk. Patent without evidence of aneurysm, dissection, vasculitis or significant stenosis. Renals: Both renal arteries are patent without evidence of aneurysm, dissection, vasculitis, fibromuscular dysplasia or significant stenosis. IMA: Patent without evidence of aneurysm, dissection, vasculitis or significant stenosis. Inflow: Patent without evidence of aneurysm, dissection, vasculitis or significant stenosis. Proximal Outflow: Bilateral common femoral and visualized portions of the superficial and profunda femoral arteries  are patent without evidence of aneurysm, dissection, vasculitis or significant stenosis. Veins: No obvious venous abnormality within the limitations of this arterial phase study. Review of the MIP images confirms the above findings. NON-VASCULAR Lower chest: Mild atelectasis in the lung bases. Mild cardiac enlargement. Hepatobiliary: No focal liver lesions. Cholelithiasis. No evidence of gallbladder wall thickening or inflammation. No bile duct dilatation. Pancreas: Unremarkable. No pancreatic ductal dilatation or surrounding inflammatory changes. Spleen: Normal in size without focal abnormality. Adrenals/Urinary Tract: No adrenal gland nodule. Benign simple appearing cyst on the left kidney measuring 2.6 cm diameter. No additional imaging follow-up is indicated for this typically benign-appearing lesion. No change since prior study. No hydronephrosis. Renal nephrograms are symmetrical. Bladder is normal. Stomach/Bowel: Stomach, small bowel, and colon are not abnormally distended. Stool seen diffusely throughout the colon. Focal contrast pooling is suggested in a diverticulum of the ascending colon, not present on the unenhanced image and mildly diffuse on the delayed image. This likely represents acute hemorrhage within a diverticulum. No associated mass or wall thickening is seen. Lymphatic: No significant lymphadenopathy. Reproductive: Status post hysterectomy. No adnexal masses. Other: No abdominal wall hernia or abnormality. No abdominopelvic ascites. Musculoskeletal: No acute or significant osseous findings. IMPRESSION: VASCULAR Aortic atherosclerosis. No aneurysm or dissection. No large vessel occlusion. NON-VASCULAR Focal active intraluminal hemorrhage demonstrated in the ascending colon consistent with a hemorrhagic diverticulum. Cholelithiasis without evidence of acute cholecystitis. Electronically Signed   By: Burman [name] M.D.   On: 11/14/2021 22:13    Assessment & Plan:   Problem List Items  Addressed This Visit     Hypothyroidism (Chronic)    She is not taking Levothroid.  Refused lab tests. Obtain FT4, TSH      Relevant Orders   Comprehensive metabolic panel (Completed)   TSH (Completed)   T4, free (Completed)   Vitamin D deficiency   Relevant Orders   VITAMIN D 25 Hydroxy (Vit-D Deficiency, Fractures) (Completed)   Irritable bladder    Doing fair      Pulmonary hypertension (HCC)    The patient had to move in with her daughter Lavona Mound in in [place], [place]. Pam teaches school.  Pam is a high risk for bringing COVID-19 virus home and infection her mother.  We will fill out FMLA form to keep her out of work till April 30, 2019.      RESOLVED: CKD (chronic kidney disease) stage 3, GFR 30-59 ml/min (HCC)    Monitor CMET Hydrate well      Rectal bleeding - Primary    Chronic C/o blood in stool - small amounts. Per hx: "Rectal bleeding from diverticulosis of colon/acute blood loss anemia: Bleeding diverticula demonstrated in ascending colon on CTA  of abdomen and pelvis upon admission.  IR was consulted and reportedly, whole team was ready to perform the embolization but family declined the procedure and wanted conservative management.  Long discussion with the son and patient which is documented in the progress note of 11/15/2021.  No further bleeding and patient does not want any intervention if it does happen"  Check CBC, INR, iron  THN ref      Relevant Orders   CBC with Differential/Platelet (Completed)   Vitamin B12 (Completed)   VITAMIN D 25 Hydroxy (Vit-D Deficiency, Fractures) (Completed)   Sedimentation rate (Completed)   Iron, TIBC and Ferritin Panel (Completed)   Generalized anxiety disorder    Worse Discussed Pt lives at [place] [facility] (senior living) - 1 bedroom condo. Her son Marcello Moores is in charge of her finances. She would like to talk to a SW      Relevant Orders   AMB Referral VBCI Care Management   Gait disorder    In PT       Other Visit Diagnoses     Paresthesia       Relevant Orders   Urinalysis (Completed)   Vitamin B12 (Completed)         Meds ordered this encounter  Medications   Vitamin D, Ergocalciferol, (DRISDOL) 1.25 MG (50000 UNIT) CAPS capsule    Sig: Take 1 capsule (50,000 Units total) by mouth every 30 (thirty) days.    Dispense:  3 capsule    Refill:  3      Follow-up: Return in about 6 weeks (around 04/21/2023) for a follow-up visit.  Sonda Primes, MD

## 2023-03-11 ENCOUNTER — Telehealth: Payer: Self-pay | Admitting: *Deleted

## 2023-03-11 LAB — IRON,TIBC AND FERRITIN PANEL
%SAT: 24 % (ref 16–45)
Ferritin: 20 ng/mL (ref 16–288)
Iron: 87 ug/dL (ref 45–160)
TIBC: 360 ug/dL (ref 250–450)

## 2023-03-11 NOTE — Progress Notes (Unsigned)
  Care Coordination  Outreach Note  03/11/2023 Name: [name] MRN: [id number] DOB: 1930/04/26   Care Coordination Outreach Attempts: An unsuccessful telephone outreach was attempted today to offer the patient information about available care coordination services.  Follow Up Plan:  Additional outreach attempts will be made to offer the patient care coordination information and services.   Encounter Outcome:  No Answer  Burman [name], CCMA Care Coordination Care Guide Direct Dial: [phone]

## 2023-03-12 ENCOUNTER — Encounter: Payer: Self-pay | Admitting: Internal Medicine

## 2023-03-12 MED ORDER — VITAMIN D (ERGOCALCIFEROL) 1.25 MG (50000 UNIT) PO CAPS: 50000.0000 [IU] | ORAL_CAPSULE | ORAL | 3 refills | Status: AC

## 2023-03-12 NOTE — Addendum Note (Signed)
Addended by: Tresa Garter on: 03/12/2023 09:38 AM   Modules accepted: Orders

## 2023-03-14 ENCOUNTER — Telehealth: Payer: Self-pay | Admitting: Internal Medicine

## 2023-03-14 NOTE — Telephone Encounter (Signed)
Pt called stating when she use the bathroom she is having a little blood during her bowel movement. Pt also stating that her head been hurting and she is not feeling well. Can a nurse call pt.

## 2023-03-14 NOTE — Progress Notes (Unsigned)
  Care Coordination  Outreach Note  03/14/2023 Name: Azera Mcclarin MRN: [id number] DOB: 08/06/1929   Care Coordination Outreach Attempts: A second unsuccessful outreach was attempted today to offer the patient with information about available care coordination services.  Follow Up Plan:  Additional outreach attempts will be made to offer the patient care coordination information and services.   Encounter Outcome:  No Answer  Burman [name], CCMA Care Coordination Care Guide Direct Dial: [phone]

## 2023-03-15 NOTE — Progress Notes (Signed)
  Care Coordination  Outreach Note  03/15/2023 Name: Rexene Vanessen MRN: [id number] DOB: Jul 30, 1929   Care Coordination Outreach Attempts: A third unsuccessful outreach was attempted today to offer the patient with information about available care coordination services.  Follow Up Plan:  No further outreach attempts will be made at this time. We have been unable to contact the patient to offer or enroll patient in care coordination services  Encounter Outcome:  No Answer  Burman [name], [facility] Care Coordination Care Guide Direct Dial: [phone]

## 2023-03-18 NOTE — Telephone Encounter (Signed)
If she thinks there is a local irritation in the perianal area, [name] needs to use wet wipes instead of toilet paper or wash, she can use over-the-counter [facility] drug cortisone cream topically 2 times a day.  She can also use Vaseline. If there is a substantial bleeding, we can refer her to see gastroenterologist.  Thank you

## 2023-03-18 NOTE — Telephone Encounter (Signed)
Lvm for pt to call the clinic back.

## 2023-04-21 ENCOUNTER — Ambulatory Visit: Payer: PRIVATE HEALTH INSURANCE | Admitting: Internal Medicine

## 2023-06-20 ENCOUNTER — Encounter (HOSPITAL_COMMUNITY): Payer: Self-pay

## 2023-06-20 ENCOUNTER — Other Ambulatory Visit: Payer: Self-pay

## 2023-06-20 ENCOUNTER — Emergency Department (HOSPITAL_COMMUNITY)
Admission: EM | Admit: 2023-06-20 | Discharge: 2023-06-20 | Disposition: A | Discharge status: Home / Self Care | Payer: Medicare HMO | Attending: Emergency Medicine | Admitting: Emergency Medicine

## 2023-06-20 DIAGNOSIS — I509 Heart failure, unspecified: Secondary | ICD-10-CM | POA: Diagnosis not present

## 2023-06-20 DIAGNOSIS — K644 Residual hemorrhoidal skin tags: Secondary | ICD-10-CM | POA: Diagnosis not present

## 2023-06-20 DIAGNOSIS — I110 Hypertensive heart disease with heart failure: Secondary | ICD-10-CM | POA: Insufficient documentation

## 2023-06-20 DIAGNOSIS — K625 Hemorrhage of anus and rectum: Secondary | ICD-10-CM | POA: Diagnosis present

## 2023-06-20 DIAGNOSIS — K649 Unspecified hemorrhoids: Secondary | ICD-10-CM

## 2023-06-20 LAB — CBC WITH DIFFERENTIAL/PLATELET
Abs Immature Granulocytes: 0 10*3/uL (ref 0.00–0.07)
Basophils Absolute: 0 10*3/uL (ref 0.0–0.1)
Basophils Relative: 1 %
Eosinophils Absolute: 0 10*3/uL (ref 0.0–0.5)
Eosinophils Relative: 1 %
HCT: 39.1 % (ref 36.0–46.0)
Hemoglobin: 12.4 g/dL (ref 12.0–15.0)
Immature Granulocytes: 0 %
Lymphocytes Relative: 23 %
Lymphs Abs: 0.8 10*3/uL (ref 0.7–4.0)
MCH: 33.9 pg (ref 26.0–34.0)
MCHC: 31.7 g/dL (ref 30.0–36.0)
MCV: 106.8 fL — ABNORMAL HIGH (ref 80.0–100.0)
Monocytes Absolute: 0.2 10*3/uL (ref 0.1–1.0)
Monocytes Relative: 7 %
Neutro Abs: 2.3 10*3/uL (ref 1.7–7.7)
Neutrophils Relative %: 68 %
Platelets: 214 10*3/uL (ref 150–400)
RBC: 3.66 MIL/uL — ABNORMAL LOW (ref 3.87–5.11)
RDW: 14.4 % (ref 11.5–15.5)
WBC: 3.4 10*3/uL — ABNORMAL LOW (ref 4.0–10.5)
nRBC: 0 % (ref 0.0–0.2)

## 2023-06-20 LAB — COMPREHENSIVE METABOLIC PANEL
ALT: 29 U/L (ref 0–44)
AST: 36 U/L (ref 15–41)
Albumin: 3.4 g/dL — ABNORMAL LOW (ref 3.5–5.0)
Alkaline Phosphatase: 94 U/L (ref 38–126)
Anion gap: 8 (ref 5–15)
BUN: 29 mg/dL — ABNORMAL HIGH (ref 8–23)
CO2: 23 mmol/L (ref 22–32)
Calcium: 9.2 mg/dL (ref 8.9–10.3)
Chloride: 108 mmol/L (ref 98–111)
Creatinine, Ser: 1.08 mg/dL — ABNORMAL HIGH (ref 0.44–1.00)
GFR, Estimated: 48 mL/min — ABNORMAL LOW (ref >60)
Glucose, Bld: 85 mg/dL (ref 70–99)
Potassium: 4 mmol/L (ref 3.5–5.1)
Sodium: 139 mmol/L (ref 135–145)
Total Bilirubin: 1.2 mg/dL (ref 0.0–1.2)
Total Protein: 6.9 g/dL (ref 6.5–8.1)

## 2023-06-20 LAB — TYPE AND SCREEN
ABO/RH(D): A POS
Antibody Screen: NEGATIVE

## 2023-06-20 MED ORDER — POLYETHYLENE GLYCOL 3350 17 GM/SCOOP PO POWD
17.0000 g | Freq: Every day | ORAL | 0 refills | Status: AC
  Filled 2023-06-20: qty 238, 14d supply, fill #0

## 2023-06-20 MED ORDER — BISACODYL 5 MG PO TBEC
5.0000 mg | DELAYED_RELEASE_TABLET | Freq: Two times a day (BID) | ORAL | 0 refills | Status: AC
  Filled 2023-06-20: qty 14, 7d supply, fill #0

## 2023-06-20 NOTE — ED Provider Triage Note (Signed)
 Emergency Medicine Provider Triage Evaluation Note  [name] , a 89 y.o. female  was evaluated in triage.  Pt complains of bleeding x 1 day.  Review of Systems  Positive: Red blood per rectum Negative: No syncope  Physical Exam  BP 126/69 (BP Location: Right Arm)   Pulse 65   Temp 98.4 F (36.9 C) (Oral)   Resp 18   Ht 1.549 m (5\' 1" )   Wt 53 kg   SpO2 100%   BMI 22.08 kg/m  Gen:   Awake, no distress   Resp:  Normal effort  MSK:   Moves extremities without difficulty  Other:    Medical Decision Making  Medically screening exam initiated at 11:43 AM.  Appropriate orders placed.  [name] was informed that the remainder of the evaluation will be completed by another provider, this initial triage assessment does not replace that evaluation, and the importance of remaining in the ED until their evaluation is complete.     Lorre Nick, MD 06/20/23 [phone]

## 2023-06-20 NOTE — Discharge Instructions (Signed)
 Your blood counts are reassuring today and show that you have not lost too much blood in your stool this morning.   You did have a small hemorrhoid on exam today.  This is likely where your bleeding comes from when you do not take your stool softener.  Drink plenty of fluids at home and eat a fiber fillet diet (lots of fruits and vegetables). Please engage in daily exercise as this also helps to maintain regular bowel movements.   Try to use the bathroom after eating a meal. Place your feet up on a small stepstool when trying to have a bowel movement.  You may take 1 scoops of Miralax in the morning daily to help you maintain normal bowel movements. You may also take Ducolax (Bisacodyl) 2 tablets (10mg ) once daily in the morning. These are medications you can get over the counter at any drugstore.   Please follow up with your PCP within the next month to address strategies to prevent constipation in the future.   Return to the ER if you are having any black stools, dizziness, weakness, any other new or concerning symptoms.

## 2023-06-20 NOTE — ED Provider Notes (Signed)
 [place] EMERGENCY DEPARTMENT AT [facility] Provider Note   CSN: [id number] Arrival date & time: 06/20/23  1048     History  Chief Complaint  Patient presents with   Rectal Bleeding    [name] is a 89 y.o. female with history of CHF, hypertension, hypothyroidism, IBS, presents with concern for some blood in her stool this morning.  States that when she does not take her laxative, she sometimes notices blood when she has a bowel movement.  She only notes the 1 episode this morning with bright red blood.  Denies any black or tarry stools.  Denies any shortness of breath, dizziness.  States this has been an ongoing problem.   Rectal Bleeding      Home Medications Prior to Admission medications   Medication Sig Start Date End Date Taking? Authorizing Provider  bisacodyl (DULCOLAX) 5 MG EC tablet Take 1 tablet (5 mg total) by mouth 2 (two) times daily. As needed for constipation 06/20/23  Yes Arabella Merles, PA-C  polyethylene glycol (MIRALAX) 17 g packet Take 17 g by mouth daily. 06/20/23  Yes Arabella Merles, PA-C  Vitamin D, Ergocalciferol, (DRISDOL) 1.25 MG (50000 UNIT) CAPS capsule Take 1 capsule (50,000 Units total) by mouth every 30 (thirty) days. 03/12/23   Plotnikov, Georgina Quint, MD      Allergies    Tilactase, Amlodipine besy-benazepril hcl, Amlodipine besylate, Clonidine hydrochloride, Lactose intolerance (gi), Valsartan, and Verapamil    Review of Systems   Review of Systems  Gastrointestinal:  Positive for hematochezia.    Physical Exam Updated Vital Signs BP (!) 168/81   Pulse 67   Temp 98.2 F (36.8 C)   Resp 16   Ht 5\' 1"  (1.549 m)   Wt 53 kg   SpO2 99%   BMI 22.08 kg/m  Physical Exam Vitals and nursing note reviewed. Exam conducted with a chaperone present.  Constitutional:      General: She is not in acute distress.    Appearance: She is well-developed.  HENT:     Head: Normocephalic and atraumatic.  Eyes:      Conjunctiva/sclera: Conjunctivae normal.  Cardiovascular:     Rate and Rhythm: Normal rate and regular rhythm.     Heart sounds: No murmur heard. Pulmonary:     Effort: Pulmonary effort is normal. No respiratory distress.     Breath sounds: Normal breath sounds.  Abdominal:     Palpations: Abdomen is soft.     Tenderness: There is no abdominal tenderness.  Genitourinary:    Comments: RN present for rectal exam.  Patient willing to undergo visual inspection.  There is a external nonthrombosed hemorrhoid on exam.  No active bleeding from the rectum.  No melanotic stool appreciated on visual exam.  Patient declines full DRE to obtain Hemoccult Musculoskeletal:        General: No swelling.     Cervical back: Neck supple.  Skin:    General: Skin is warm and dry.     Capillary Refill: Capillary refill takes less than 2 seconds.  Neurological:     Mental Status: She is alert.  Psychiatric:        Mood and Affect: Mood normal.     ED Results / Procedures / Treatments   Labs (all labs ordered are listed, but only abnormal results are displayed) Labs Reviewed  COMPREHENSIVE METABOLIC PANEL - Abnormal; Notable for the following components:      Result Value   BUN 29 (*)  Creatinine, Ser 1.08 (*)    Albumin 3.4 (*)    GFR, Estimated 48 (*)    All other components within normal limits  CBC WITH DIFFERENTIAL/PLATELET - Abnormal; Notable for the following components:   WBC 3.4 (*)    RBC 3.66 (*)    MCV 106.8 (*)    All other components within normal limits  POC OCCULT BLOOD, ED  TYPE AND SCREEN    EKG None  Radiology No results found.  Procedures Procedures    Medications Ordered in ED Medications - No data to display  ED Course/ Medical Decision Making/ A&P                                 Medical Decision Making Risk OTC drugs.     Differential diagnosis includes but is not limited to upper GI bleed, lower GI bleed including diverticular bleeding,  hemorrhoid  ED Course:  Patient very well-appearing, stable vital signs.  Reports an episode of bright red blood in her stool this morning.  On exam, she does have a hemorrhoid that I suspect is the cause of her episodic bleeding when she does not take her stool softener.  There is no active bleeding from the rectum.  No melanotic stool.  Patient declines full DRE and I am unable to obtain hemoccult, however, I suspect the hemorrhoid is where her bleeding is coming from as this only happens when she strains on the toiler and the blood is bright red. Her hemoglobin is 12.4, no indication of acute blood loss.  She denies any symptoms like dizziness, shortness of breath, weakness. I Ordered, and personally interpreted labs.  The pertinent results include:   CBC with neutropenia of 3.4, hemoglobin normal at 12.4 CMP unremarkable   Patient stable and appropriate for discharge home.   Impression: Non-thrombosed Hemorrhoid  Disposition:  The patient was discharged home with instructions to may use 1 scoop of MiraLAX daily to help maintain normal bowel movements. May add 10mg  dulcolax daily if needed for further management of constipation. Drink plenty of water. Follow-up with her PCP within the next month to discuss further strategies to prevent constipation. Return precautions given.              Final Clinical Impression(s) / ED Diagnoses Final diagnoses:  Hemorrhoids, unspecified hemorrhoid type    Rx / DC Orders ED Discharge Orders          Ordered    polyethylene glycol (MIRALAX) 17 g packet  Daily        06/20/23 1754    bisacodyl (DULCOLAX) 5 MG EC tablet  2 times daily        06/20/23 1759              Arabella Merles, PA-C 06/20/23 1800    Ernie Avena, MD 06/20/23 1820

## 2023-06-20 NOTE — ED Triage Notes (Signed)
 Pt bib ems from [facility] c/o bright red blood in stool. Pt had abdominal pain and went to have a BM when she noticed bright red stool. Pt feels the bleeding is caused from a metal piece from her dentures. No thinners.  BP 140/80 HR 86 RA 98 CBG 146 RR 18

## 2023-06-21 ENCOUNTER — Other Ambulatory Visit (HOSPITAL_COMMUNITY): Payer: Self-pay

## 2023-06-21 ENCOUNTER — Other Ambulatory Visit: Payer: Self-pay

## 2023-06-22 ENCOUNTER — Ambulatory Visit: Payer: Self-pay | Admitting: Internal Medicine

## 2023-06-22 ENCOUNTER — Telehealth: Payer: Self-pay | Admitting: Internal Medicine

## 2023-06-22 NOTE — Telephone Encounter (Signed)
 Copied from CRM [id number]. Topic: Clinical - Prescription Issue >> Jun 21, 2023  4:04 PM Shon Hale wrote: Reason for CRM: Patient calling on community line. Patient seen at ED overnight for rectal bleeding. Patient prescribed medications, patient unable to get prescriptions due financial issues.   Patient requesting assistance with getting prescriptions.   Please assist patient further, best callback number: [phone]

## 2023-06-22 NOTE — Telephone Encounter (Signed)
 Patient calling to check on previous message. Patient requesting assistance with getting prescriptions. Patient requesting a callback, [phone].

## 2023-06-22 NOTE — Telephone Encounter (Signed)
 Copied from CRM [id number]. Topic: Clinical - Red Word Triage >> Jun 22, 2023  1:17 PM [name] wrote: Red Word that prompted transfer to Nurse Triage: rectal bleeding/ pt high anxiety  Chief Complaint: Rectal bleeding Symptoms: Rectal bleeding Frequency: 4-5 months Pertinent Negatives: Patient denies vomiting Disposition: [] ED /[] Urgent Care (no appt availability in office) / [x] Appointment(In office/virtual)/ []  [facility]/ [] Home Care/ [] Refused Recommended Disposition /[] [facility]/ []  Follow-up with PCP Additional Notes: Patient called in to complain of rectal bleeding that has been occurring for 3-5 months. Patient was evaluated in the ED on 06/10/23 and treated for a hemorrhoid. Patient stated she sees blood on the toilet paper after wiping and sometimes in her stool. Patient stated she experiences diarrhea or constipation depending on what she eats in the cafeteria. Patient believes that the food from the cafeteria is causing the bleeding. Patient denied vomiting and dizziness. Patient is seeking a follow-up appointment with her PCP. This RN scheduled a HFU with PCP for next week. This RN advised patient to call back if symptoms worsen. Patient complied.   Reason for Disposition  Rectal bleeding is a chronic symptom (recurrent or ongoing AND present > 4 weeks)  Answer Assessment - Initial Assessment Questions 1. APPEARANCE of BLOOD: "What color is it?" "Is it passed separately, on the surface of the stool, or mixed in with the stool?"      States blood is dark in color 2. AMOUNT: "How much blood was passed?"      States she sees blood when wiping, but sometimes sees blood in stool when she looks in the toilet 3. FREQUENCY: "How many times has blood been passed with the stools?"      States she sees blood with every stool 4. ONSET: "When was the blood first seen in the stools?" (Days or weeks)      Seen in ED on 06/20/23 for rectal bleeding/hemorrhoids, states  bleeding has been going on for 3-5 months 5. DIARRHEA: "Is there also some diarrhea?" If Yes, ask: "How many diarrhea stools in the past 24 hours?"      States she gets diarrhea sometimes after eating food from cafeteria 6. CONSTIPATION: "Do you have constipation?" If Yes, ask: "How bad is it?"     States she gets constipated when she doesn't eat 7. RECURRENT SYMPTOMS: "Have you had blood in your stools before?" If Yes, ask: "When was the last time?" and "What happened that time?"      States bleeding has been going on for 3-5 months 8. BLOOD THINNERS: "Do you take any blood thinners?" (e.g., Coumadin/warfarin, Pradaxa/dabigatran, aspirin)     Not listed in MAR 9. OTHER SYMPTOMS: "Do you have any other symptoms?"  (e.g., abdomen pain, vomiting, dizziness, fever)     Denies vomiting, denies dizziness  Protocols used: Rectal Bleeding-A-AH

## 2023-06-27 NOTE — Telephone Encounter (Signed)
 Noted. Thanks.

## 2023-06-28 ENCOUNTER — Inpatient Hospital Stay: Payer: PRIVATE HEALTH INSURANCE | Admitting: Internal Medicine

## 2023-06-30 ENCOUNTER — Other Ambulatory Visit (HOSPITAL_COMMUNITY): Payer: Self-pay

## 2023-07-02 ENCOUNTER — Other Ambulatory Visit (HOSPITAL_COMMUNITY): Payer: Self-pay

## 2023-07-06 ENCOUNTER — Ambulatory Visit: Payer: Self-pay | Admitting: Internal Medicine

## 2023-07-06 NOTE — Telephone Encounter (Signed)
 Copied from CRM [id number]. Topic: Clinical - Medical Advice >> Jul 06, 2023  2:51 PM Albin Felling L wrote: Reason for CRM: Patient living at a center - someone advised patient that the water downstairs in building was not okay to drink. Patient bought water at the store and saw it had calcium chloiride & sodium bicarbinate. Patient inquiring if that is safe to drink.   Patient stated it was not an emergency but just wanted to know if water okay to drink Reason for Disposition  General information question, no triage required and triager able to answer question  Answer Assessment - Initial Assessment Questions 1. REASON FOR CALL or QUESTION: "What is your reason for calling today?" or "How can I best help you?" or "What question do you have that I can help answer?"     Patient calling to check if the bottled water she bought is okay to drink. Bottled water has calcium chloride and sodium bicarbonate in it. Patient was told that this bottled water was okay for her to drink. No other concerns  Protocols used: Information Only Call - No Triage-A-AH

## 2023-07-07 NOTE — Telephone Encounter (Signed)
 Provider has stated "It is okay to drink water that she brought.  Thank you."  Please inform pt of the above upon her return phone call.

## 2023-07-07 NOTE — Telephone Encounter (Signed)
 It is okay to drink water that she brought.  Thank you

## 2023-09-15 ENCOUNTER — Telehealth: Payer: Self-pay | Admitting: Internal Medicine

## 2023-09-15 NOTE — Telephone Encounter (Signed)
 Contacted [name] to schedule their annual wellness visit. Patient declined to schedule AWV at this time. Transferred care [facility].    [facility] Care Guide [facility] AWV TEAM Direct Dial: [phone]

## 2023-12-13 ENCOUNTER — Ambulatory Visit

## 2024-01-20 ENCOUNTER — Ambulatory Visit

## 2024-02-24 ENCOUNTER — Telehealth: Payer: Self-pay

## 2024-02-24 NOTE — Telephone Encounter (Signed)
 Left detailed message for daughter Willy Ishihara advising to call device clinic to schedule follow up for Pt.  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  Scheduler spoke with Pt's son Milo:   Spoke w/ patients son Milo to get patient scheduled for EP MD to re-establish care for MDT PPM. He stated 'yeah we aint doing that. It hasn't worked in the last 2 years, battery is dead. Its just a waste of time'.   Last transmission received in 2022.  Pt had 11 years on her battery at that time.  Pt's underlying rhythm at last in office visit was AS/VS 39.

## 2024-02-27 NOTE — Telephone Encounter (Signed)
 Called and spoke with patient's daughter, Willy Ishihara, who said she didn't have any information regarding her mother's device and to call her brother Milo  This RN informed her that prior communication with Milo did not go well and that he was refusing to bring his mother in for a device check  This RN asked if Willy would be willing to bring her mom in to the clinic and she stated she wont ever do anything I tell her to and so I'm not even gonna try.  This RN then asked if Willy would please try to convince Milo to bring her so we can please check her device and she stated, Milo doesn't trust doctors and never has so there's no way he will bring her in and I'll never be able to convince him either.  Call was ended

## 2024-04-02 ENCOUNTER — Telehealth: Payer: Self-pay

## 2024-04-02 NOTE — Telephone Encounter (Signed)
 Copied from CRM [id number]. Topic: Clinical - Order For Equipment >> Apr 02, 2024 11:46 AM Winona SAUNDERS wrote:  Mliss rehab director from [employer] calling to request a order for Rolator walker as the pts  current one is falling apart and is now a safety hazard.

## 2024-04-06 NOTE — Telephone Encounter (Signed)
 Tried to contact pt in regards to making an appointment to be seen for further treatment

## 2024-04-06 NOTE — Telephone Encounter (Signed)
 The pt last saw me in Nov 2024. I'm ok with this order. Sharece should sch OV, unless she is seeing another doctor now. Thx

## 2024-04-17 NOTE — Telephone Encounter (Unsigned)
 Copied from CRM #[id number]. Topic: Clinical - Request for Lab/Test Order >> Apr 17, 2024  2:27 PM Mia F wrote: Reason for CRM: Mliss from [employer] says she sent over a Delayed Certification form over for speech, occupational, and physical therapy evals. These require a doctor signature to move forward. She says she has been calling about this for a few weeks and has not received a response. [phone] Mliss

## 2024-04-17 NOTE — Telephone Encounter (Signed)
 Attempted to call Mliss back but had to leave a message. Relayed in message the last message from Dr.Plotnikov

## 2024-05-02 ENCOUNTER — Telehealth: Payer: Self-pay

## 2024-05-02 NOTE — Telephone Encounter (Signed)
 Copied from CRM [id number]. Topic: General - Other >> May 02, 2024  9:15 AM Pinkey ORN wrote: Reason for CRM: [facility] >> May 02, 2024  9:23 AM Pinkey ORN wrote: [name] [phone] / Fax # [phone]  Called on behalf of patient, states her gate deviation has worsened over time, patient can no longer can stand without support. Also states that patient's anxiety has worsened as well, which has now caused her phobia to worsen to the point she's not wanting to walk. [name] states patient is now placed on medical hold and will no longer be seen until Plotnikov, Karlynn GAILS, MD approves / clear her as well as send over the requested documents.   [name] also states that patient isn't able to make sure medical decisions on her own, as she's been having to go through patient's son [name] [phone]. Also states she had to call in [name] a few times to sign on behalf of patient.   Any further questions or concerns, please contact [name] directly.

## 2024-05-02 NOTE — Telephone Encounter (Addendum)
 Noted.  The patient saw me last in November 2024.  Please schedule office visit.  Thanks

## 2024-05-03 NOTE — Telephone Encounter (Signed)
 Tried to reach the pt to inform her of PCP instructions Noted.  The patient saw me last in November 2024.  Please schedule office visit.  Thanks
